# Patient Record
Sex: Female | Born: 1965
Health system: Southern US, Community
[De-identification: ages and names within clinical notes are randomized; demographics above are authoritative.]

## PROBLEM LIST (undated history)

## (undated) DIAGNOSIS — D219 Benign neoplasm of connective and other soft tissue, unspecified: Secondary | ICD-10-CM

## (undated) DIAGNOSIS — G4730 Sleep apnea, unspecified: Secondary | ICD-10-CM

## (undated) DIAGNOSIS — M25562 Pain in left knee: Secondary | ICD-10-CM

## (undated) DIAGNOSIS — F329 Major depressive disorder, single episode, unspecified: Secondary | ICD-10-CM

## (undated) DIAGNOSIS — Z9289 Personal history of other medical treatment: Secondary | ICD-10-CM

## (undated) DIAGNOSIS — I340 Nonrheumatic mitral (valve) insufficiency: Secondary | ICD-10-CM

## (undated) DIAGNOSIS — I77810 Thoracic aortic ectasia: Secondary | ICD-10-CM

## (undated) DIAGNOSIS — D649 Anemia, unspecified: Secondary | ICD-10-CM

## (undated) DIAGNOSIS — R600 Localized edema: Secondary | ICD-10-CM

## (undated) DIAGNOSIS — R51 Headache: Secondary | ICD-10-CM

## (undated) DIAGNOSIS — L309 Dermatitis, unspecified: Secondary | ICD-10-CM

## (undated) DIAGNOSIS — F32A Depression, unspecified: Secondary | ICD-10-CM

## (undated) DIAGNOSIS — M25561 Pain in right knee: Secondary | ICD-10-CM

## (undated) DIAGNOSIS — I10 Essential (primary) hypertension: Secondary | ICD-10-CM

## (undated) DIAGNOSIS — J45909 Unspecified asthma, uncomplicated: Secondary | ICD-10-CM

## (undated) DIAGNOSIS — J302 Other seasonal allergic rhinitis: Secondary | ICD-10-CM

## (undated) HISTORY — PX: BREAST BIOPSY: SHX20

## (undated) HISTORY — DX: Nonrheumatic mitral (valve) insufficiency: I34.0

## (undated) HISTORY — DX: Sleep apnea, unspecified: G47.30

## (undated) HISTORY — DX: Thoracic aortic ectasia: I77.810

## (undated) HISTORY — PX: BREAST SURGERY: SHX581

## (undated) HISTORY — PX: BREAST EXCISIONAL BIOPSY: SUR124

## (undated) HISTORY — DX: Dermatitis, unspecified: L30.9

## (undated) HISTORY — DX: Localized edema: R60.0

## (undated) HISTORY — PX: ABDOMINAL HYSTERECTOMY: SHX81

---

## 1998-04-27 ENCOUNTER — Emergency Department (HOSPITAL_COMMUNITY): Admission: EM | Admit: 1998-04-27 | Discharge: 1998-04-27 | Payer: Self-pay | Admitting: Emergency Medicine

## 2000-08-28 ENCOUNTER — Emergency Department (HOSPITAL_COMMUNITY): Admission: EM | Admit: 2000-08-28 | Discharge: 2000-08-28 | Payer: Self-pay | Admitting: Emergency Medicine

## 2003-08-09 ENCOUNTER — Inpatient Hospital Stay (HOSPITAL_COMMUNITY): Admission: EM | Admit: 2003-08-09 | Discharge: 2003-08-11 | Payer: Self-pay | Admitting: Emergency Medicine

## 2003-08-09 IMAGING — CT CT CHEST W/ CM
2 of 5 series · 12 of 30 positions shown, 15 images · IV contrast (omnipaque)
Comparison: none

CLINICAL DATA: Cough.  Perihilar mass on chest x-ray.
 CT SCAN OF THE CHEST WITH CONTRAST
 The patient received 100 cc of Omnipaque 300 IV.  Comparison with a chest x-ray from earlier today. 
 There is suboptimal opacification of the pulmonary vessels due to timing of the scan. 
 There is a mass in the right inferior hilar region measuring 22 x 28 mm.  The mass appears relatively solid and is of soft tissue density.  The margins are irregular but not definitely spiculated.  In addition, there is some right hilar adenopathy.  The mass could be due to a neoplasm or possibly a round pneumonia and short term followup is suggested in approximately two weeks to evaluate for interval change.  The patient currently has a cough and fever and may have pneumonia based on the clinical evaluation. 
 No other lung masses are seen and there is no effusion.  There is no mediastinal adenopathy.
 IMPRESSION
 22 x 28 mm right lower hilar mass with adjacent hilar adenopathy. This could represent a carcinoma or possibly a rounded pneumonia.  Short-term followup CT is suggested in two weeks to determine for interval change.  If the lesion is stable then biopsy is suggested.

[Series 4: miroi · axial · 0.70mm/px · z∈[-87,-87]mm · 2 of 22 slices shown]
[im 8/22  lung]
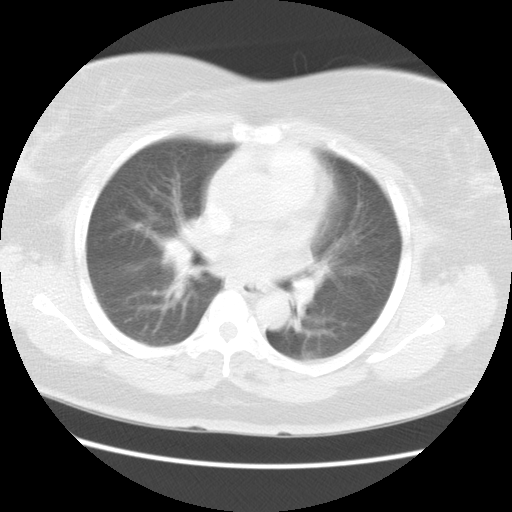
[im 15/22  lung]
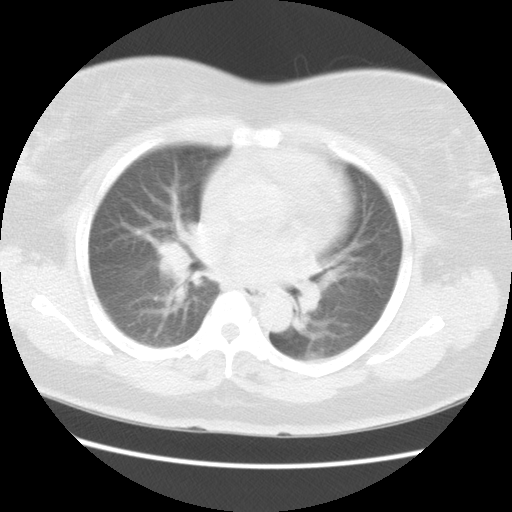

[Series 5: routine chest · axial · 0.70mm/px · z∈[-246,-21]mm · 10 of 57 slices shown, 13 images]
[im 6/57  mediastinal]
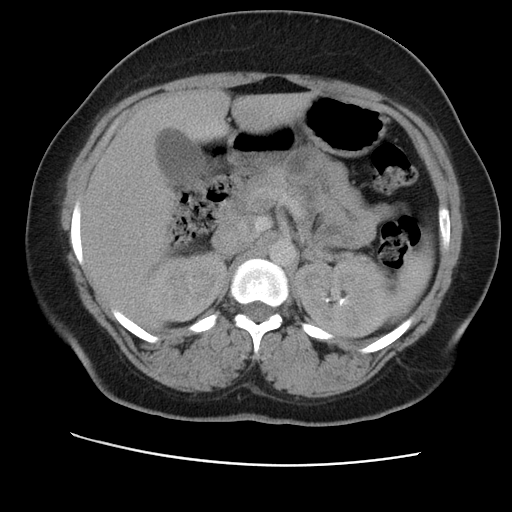
[im 6/57  lung]
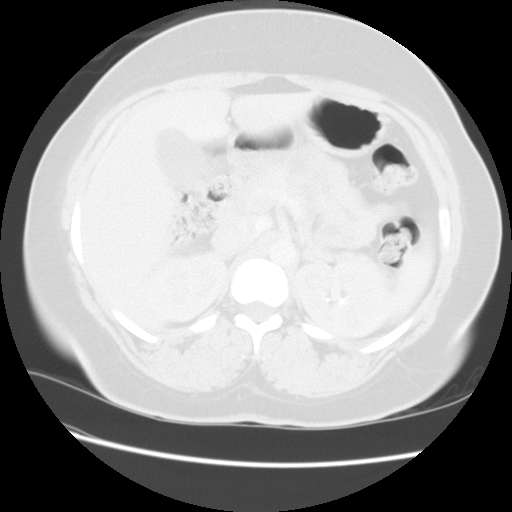
[im 12/57  lung]
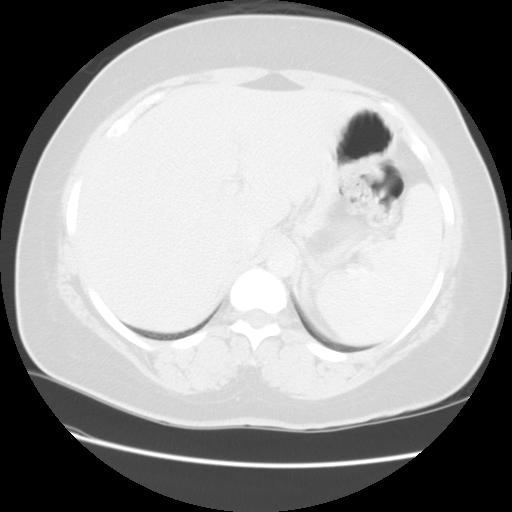
[im 17/57  lung]
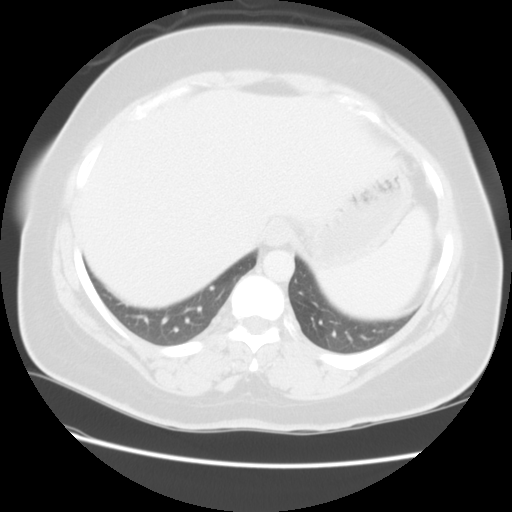
[im 23/57  lung]
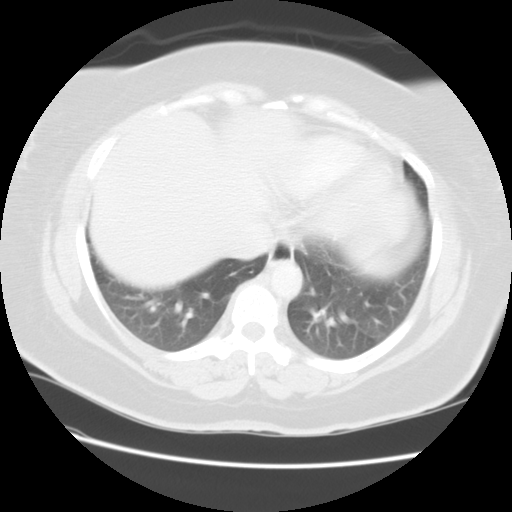
[im 27/57  mediastinal]
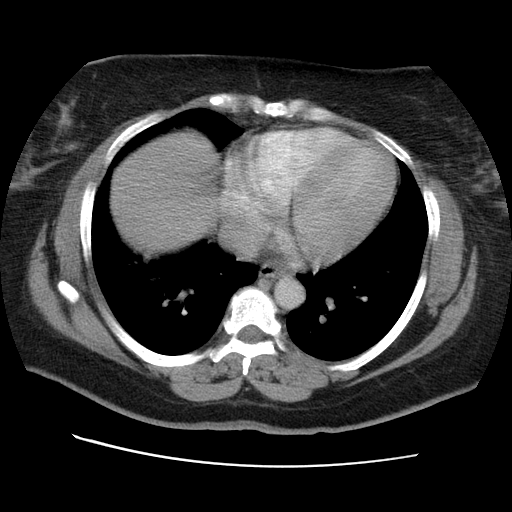
[im 27/57  lung]
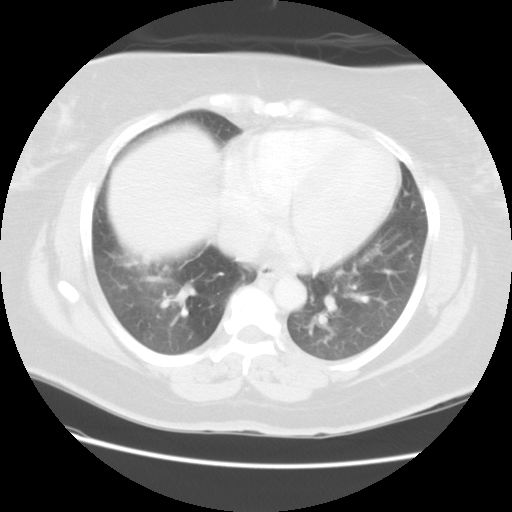
[im 29/57  lung]
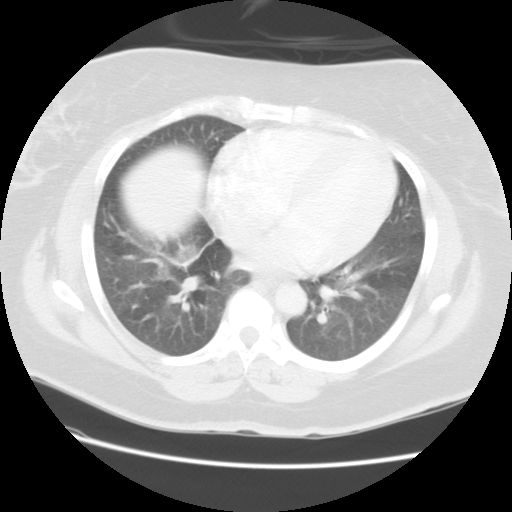
[im 34/57  lung]
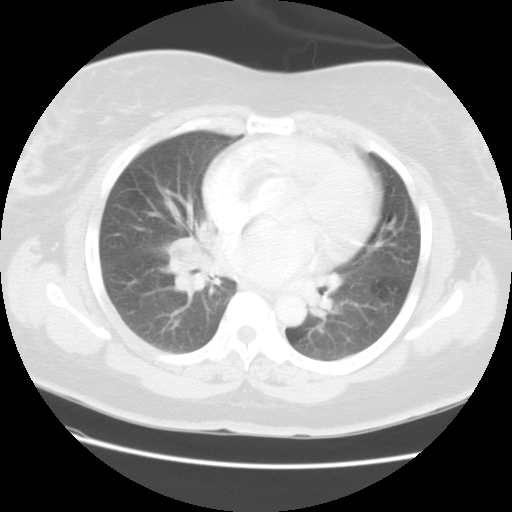
[im 40/57  lung]
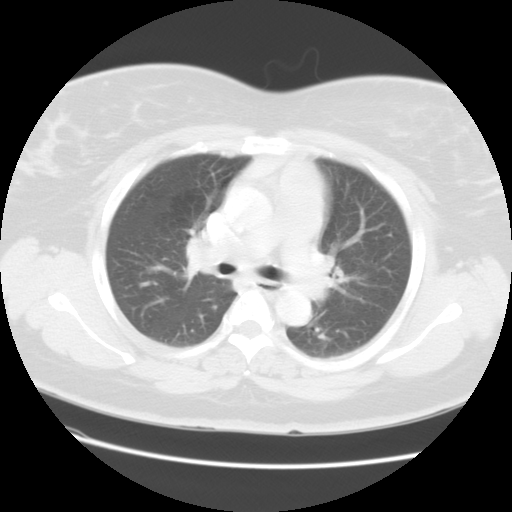
[im 45/57  mediastinal]
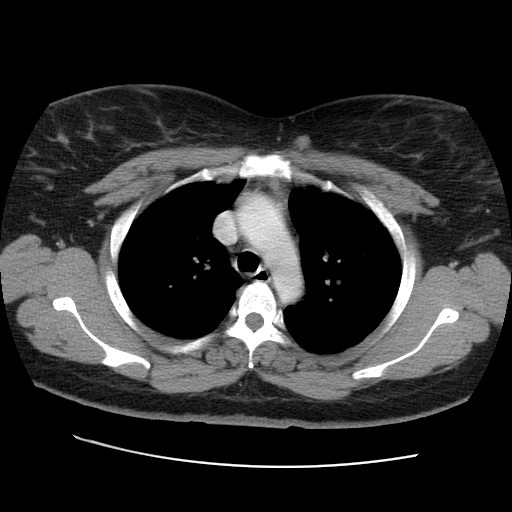
[im 45/57  lung]
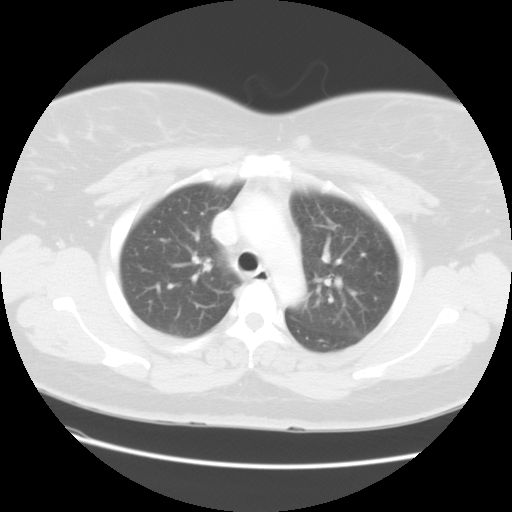
[im 51/57  lung]
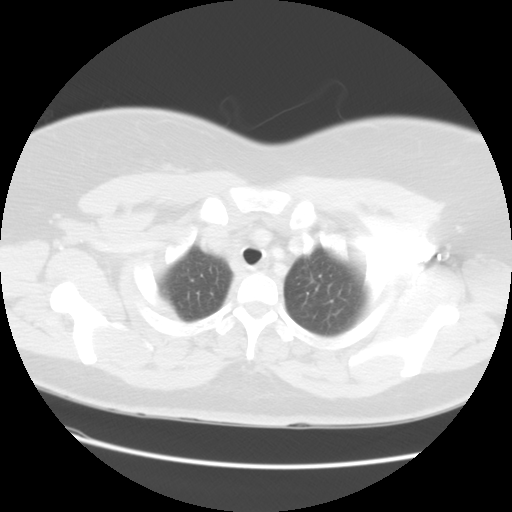

[12 of 30 positions shown; findings below may reference images not displayed]

## 2004-04-28 ENCOUNTER — Emergency Department (HOSPITAL_COMMUNITY): Admission: EM | Admit: 2004-04-28 | Discharge: 2004-04-28 | Payer: Self-pay | Admitting: Emergency Medicine

## 2005-07-09 ENCOUNTER — Emergency Department (HOSPITAL_COMMUNITY): Admission: EM | Admit: 2005-07-09 | Discharge: 2005-07-09 | Payer: Self-pay | Admitting: Emergency Medicine

## 2005-08-03 ENCOUNTER — Ambulatory Visit (HOSPITAL_COMMUNITY): Admission: RE | Admit: 2005-08-03 | Discharge: 2005-08-03 | Payer: Self-pay | Admitting: *Deleted

## 2005-09-18 ENCOUNTER — Encounter (INDEPENDENT_AMBULATORY_CARE_PROVIDER_SITE_OTHER): Payer: Self-pay | Admitting: *Deleted

## 2005-09-18 ENCOUNTER — Inpatient Hospital Stay (HOSPITAL_COMMUNITY): Admission: RE | Admit: 2005-09-18 | Discharge: 2005-09-20 | Payer: Self-pay | Admitting: Obstetrics and Gynecology

## 2005-10-19 ENCOUNTER — Ambulatory Visit: Payer: Self-pay | Admitting: Family Medicine

## 2005-12-25 ENCOUNTER — Ambulatory Visit: Payer: Self-pay | Admitting: Internal Medicine

## 2006-06-07 ENCOUNTER — Ambulatory Visit: Payer: Self-pay | Admitting: Family Medicine

## 2006-09-17 ENCOUNTER — Ambulatory Visit (HOSPITAL_COMMUNITY): Admission: RE | Admit: 2006-09-17 | Discharge: 2006-09-17 | Payer: Self-pay | Admitting: Obstetrics and Gynecology

## 2006-09-17 IMAGING — MG MM DIGITAL SCREENING BILAT
4 series · 4 of 4 positions shown · non-contrast
Comparison: none

DG SCREEN MAMMOGRAM BILATERAL
Bilateral CC and MLO view(s) were taken.
Prior study comparison: [DATE], bilateral screening mammogram.

DIGITAL SCREENING MAMMOGRAM WITH CAD:
There are scattered fibroglandular densities.  There is no dominant mass, architectural distortion 
or calcification to suggest malignancy.

[R CC]
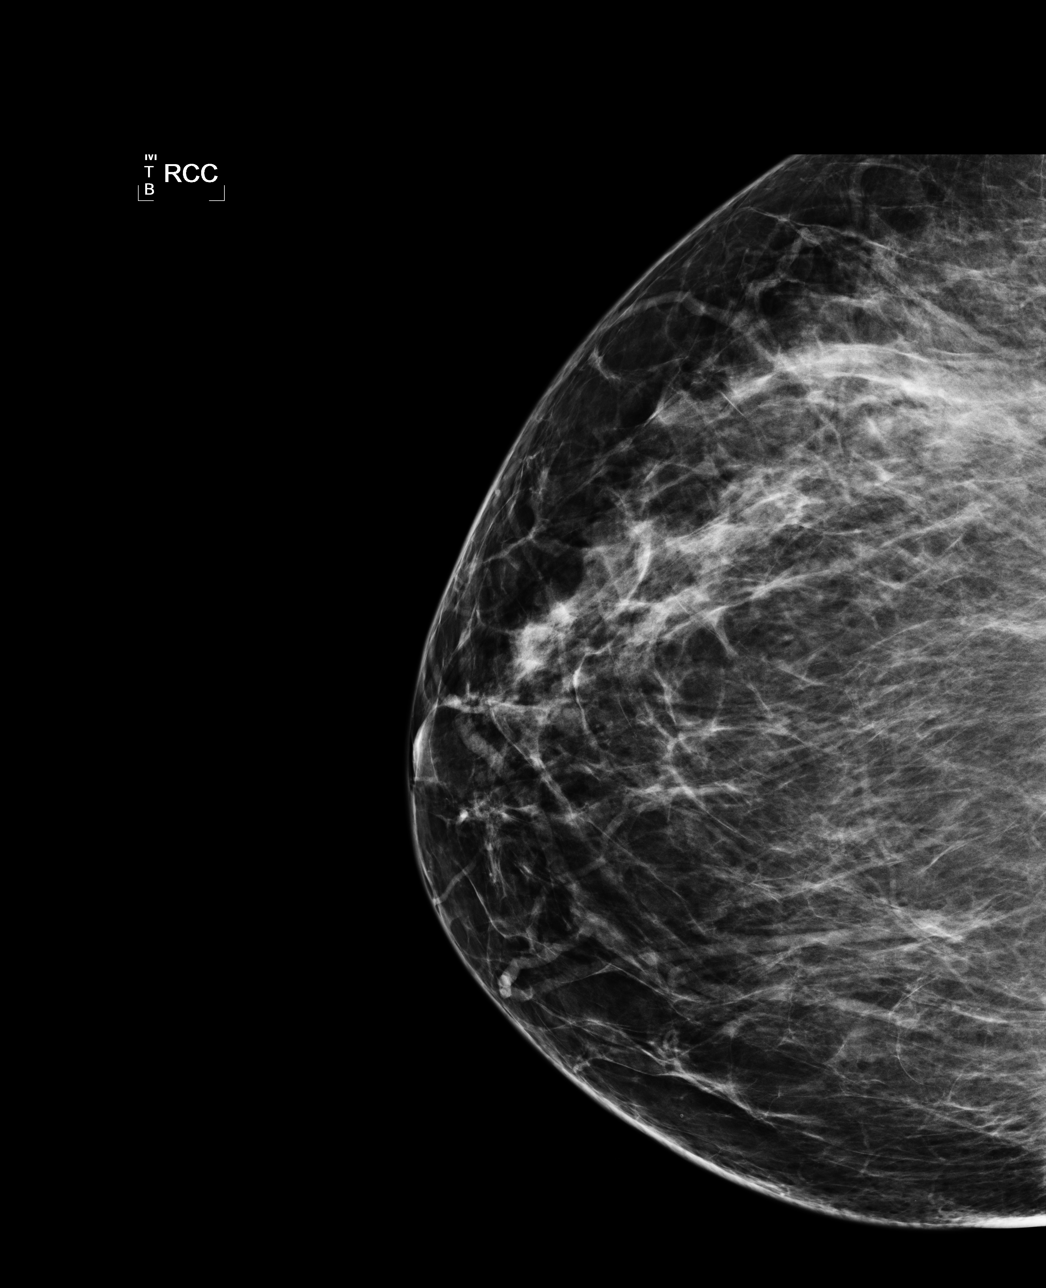

[R MLO]
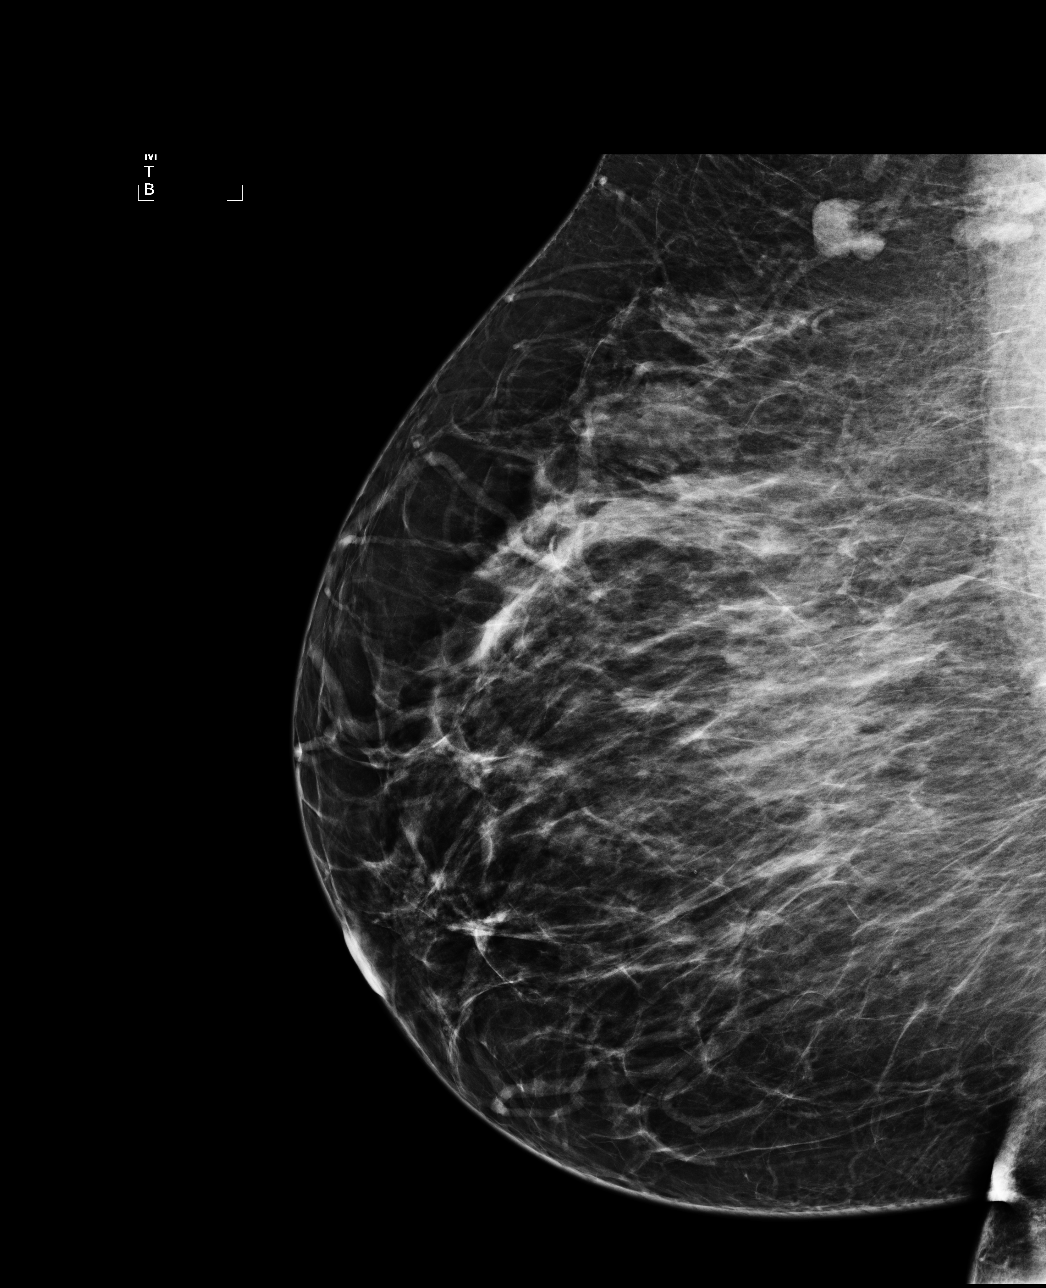

[L CC]
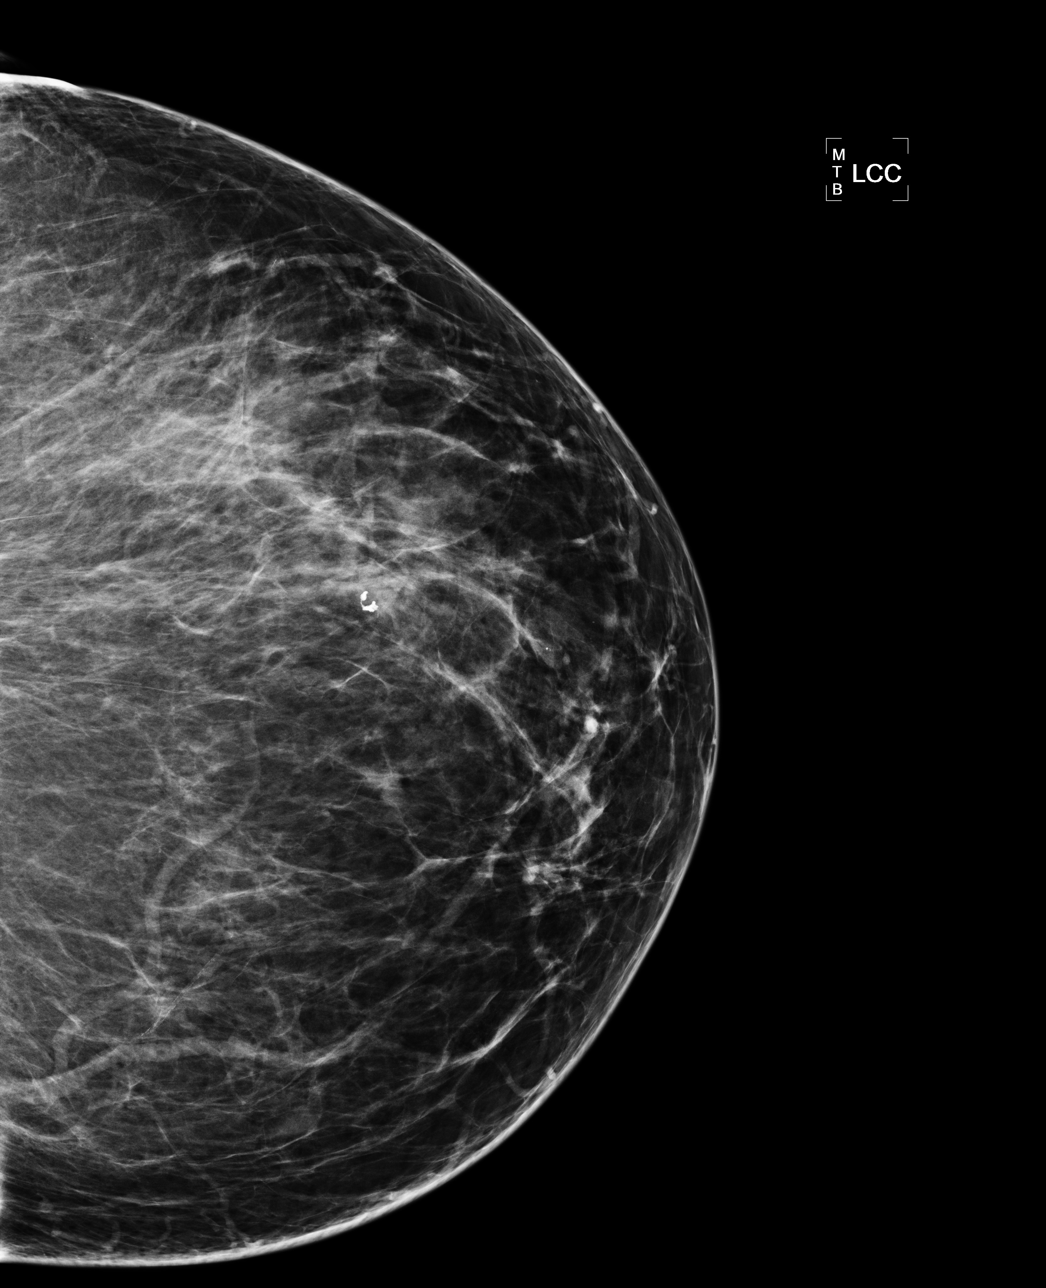

[L MLO]
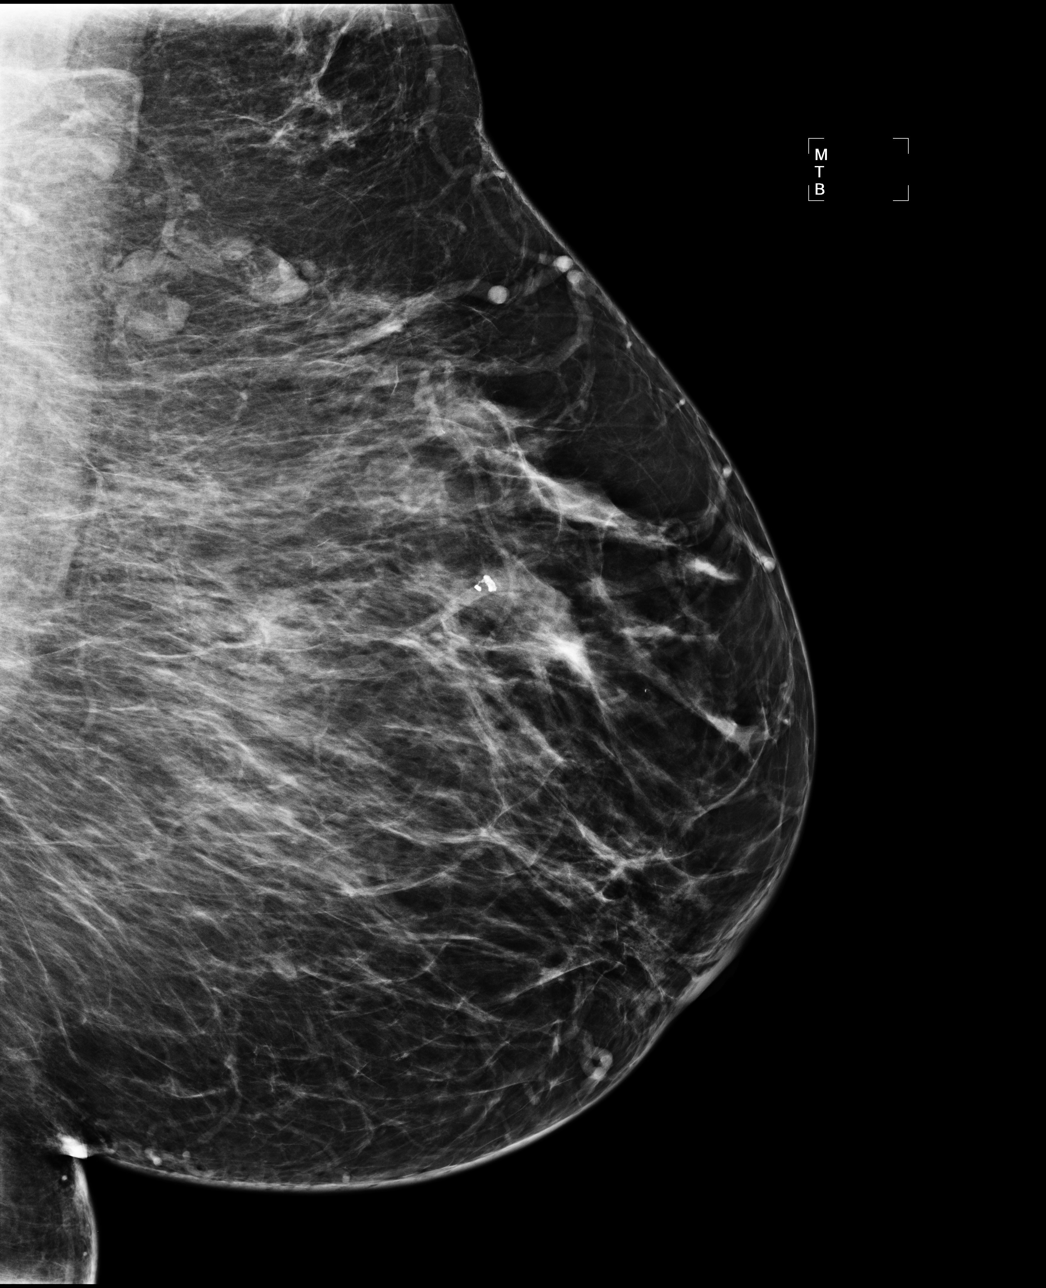

[4 of 4 positions shown; findings below may reference images not displayed]

IMPRESSION: No mammographic evidence of malignancy.  Suggest yearly screening mammography.

ASSESSMENT: Negative - BI-RADS 1

Screening mammogram in 1 year.
ANALYZED BY COMPUTER AIDED DETECTION. , THIS PROCEDURE WAS A DIGITAL MAMMOGRAM.

## 2007-03-20 ENCOUNTER — Ambulatory Visit: Payer: Self-pay | Admitting: Family Medicine

## 2007-03-21 ENCOUNTER — Encounter (INDEPENDENT_AMBULATORY_CARE_PROVIDER_SITE_OTHER): Payer: Self-pay | Admitting: *Deleted

## 2007-05-02 ENCOUNTER — Emergency Department (HOSPITAL_COMMUNITY): Admission: EM | Admit: 2007-05-02 | Discharge: 2007-05-02 | Payer: Self-pay | Admitting: Family Medicine

## 2007-09-25 ENCOUNTER — Ambulatory Visit (HOSPITAL_COMMUNITY): Admission: RE | Admit: 2007-09-25 | Discharge: 2007-09-25 | Payer: Self-pay | Admitting: Obstetrics and Gynecology

## 2007-09-25 ENCOUNTER — Encounter (INDEPENDENT_AMBULATORY_CARE_PROVIDER_SITE_OTHER): Payer: Self-pay | Admitting: Nurse Practitioner

## 2007-09-25 IMAGING — MG MM DIGITAL SCREENING BILAT W/ CAD
6 series · 6 of 6 positions shown · non-contrast
Comparison: none

DG SCREEN MAMMOGRAM BILATERAL
Bilateral CC and MLO view(s) were taken.

DIGITAL SCREENING MAMMOGRAM WITH CAD:
There are scattered fibroglandular densities.  No masses or malignant type calcifications are 
identified.  Compared with prior studies.

[R CC]
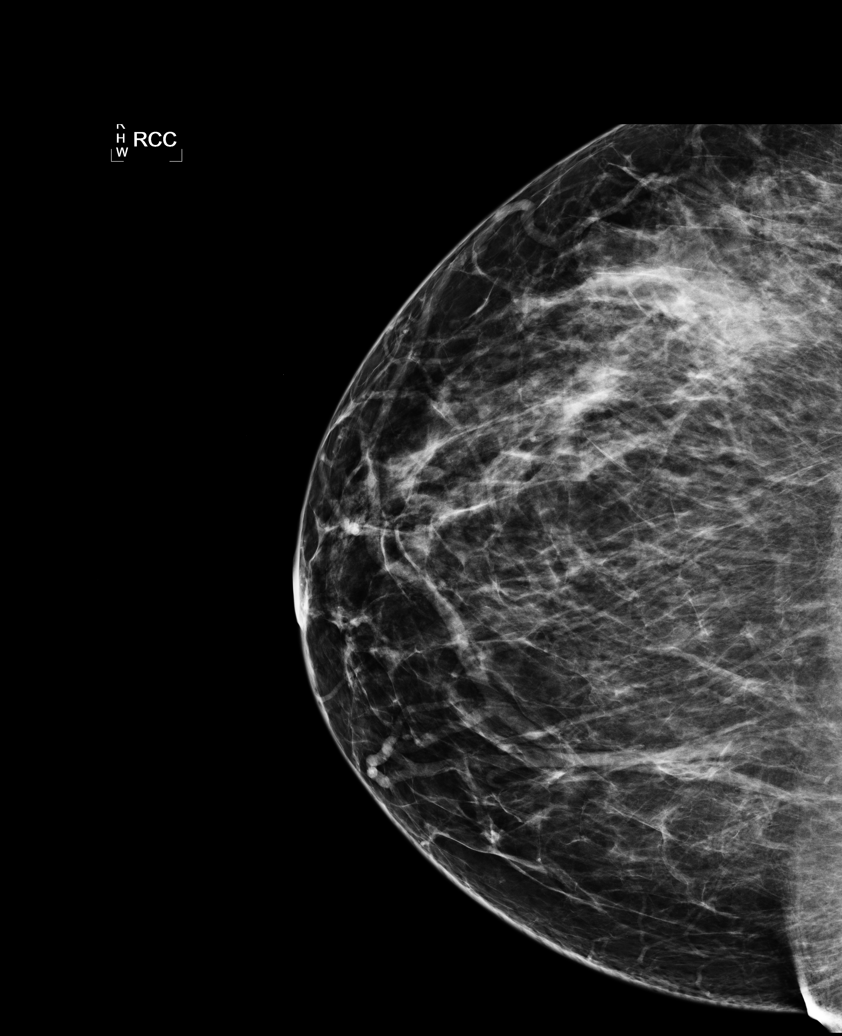

[R MLO (1 of 2)]
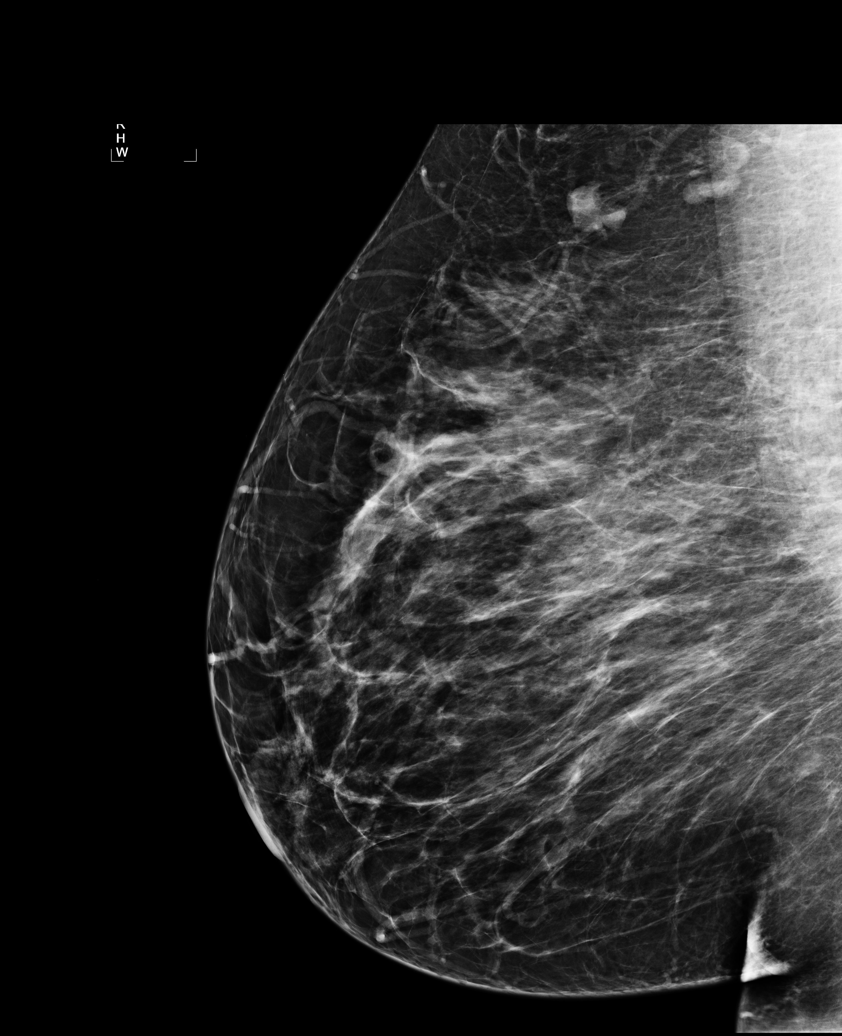

[L CC]
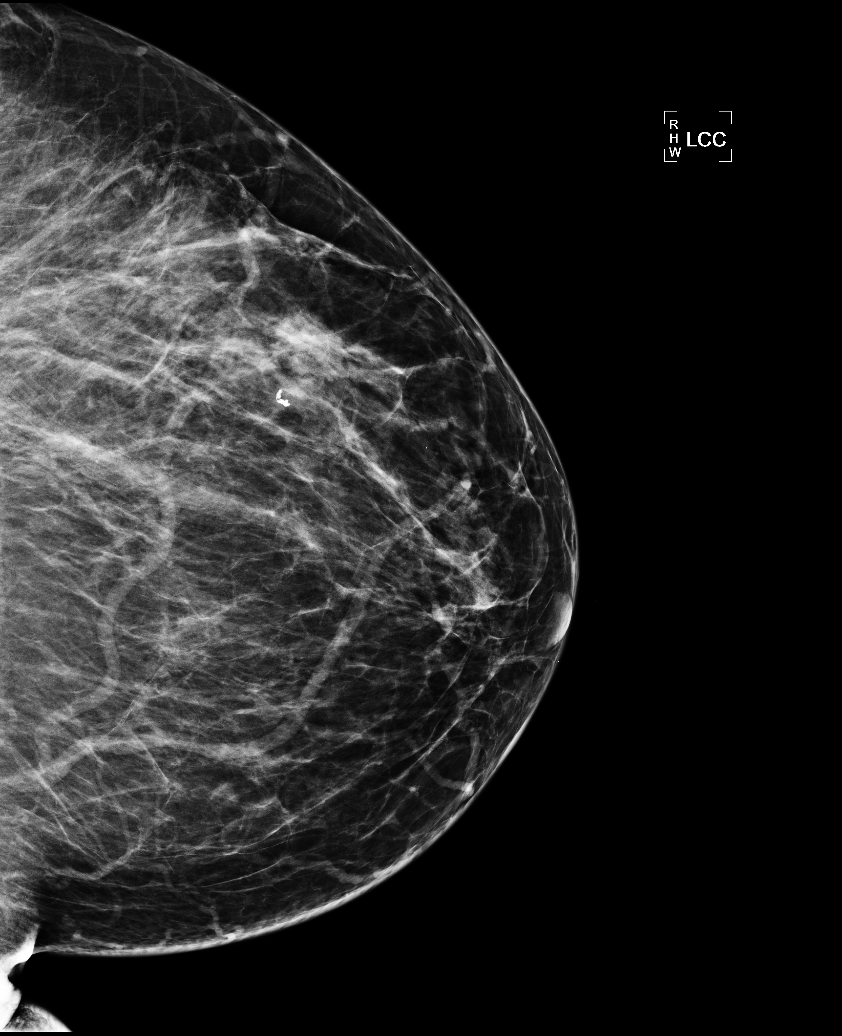

[L MLO (1 of 2)]
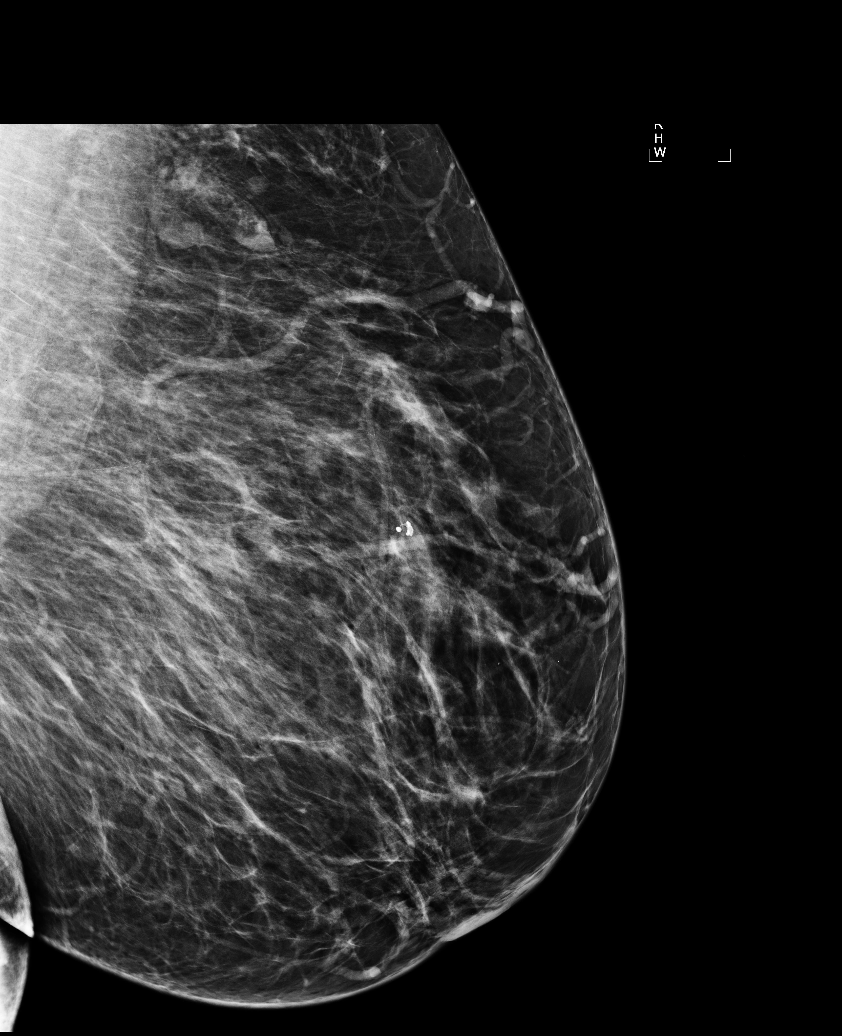

[R MLO (2 of 2)]
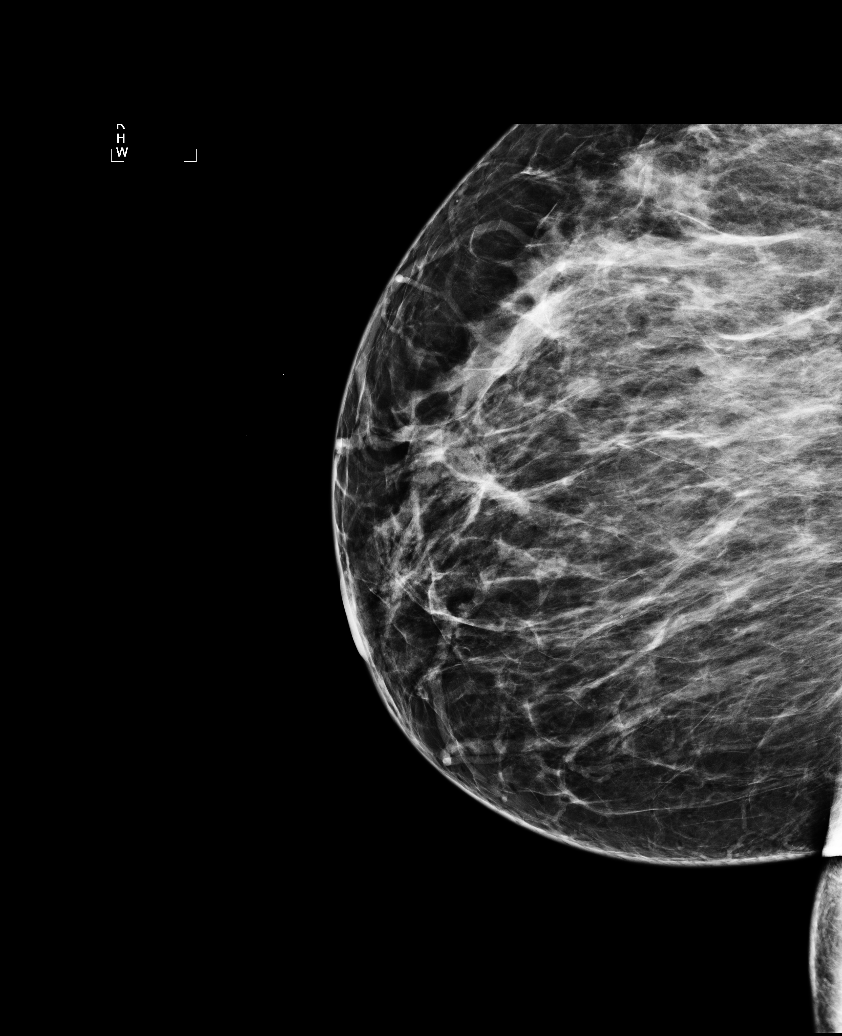

[L MLO (2 of 2)]
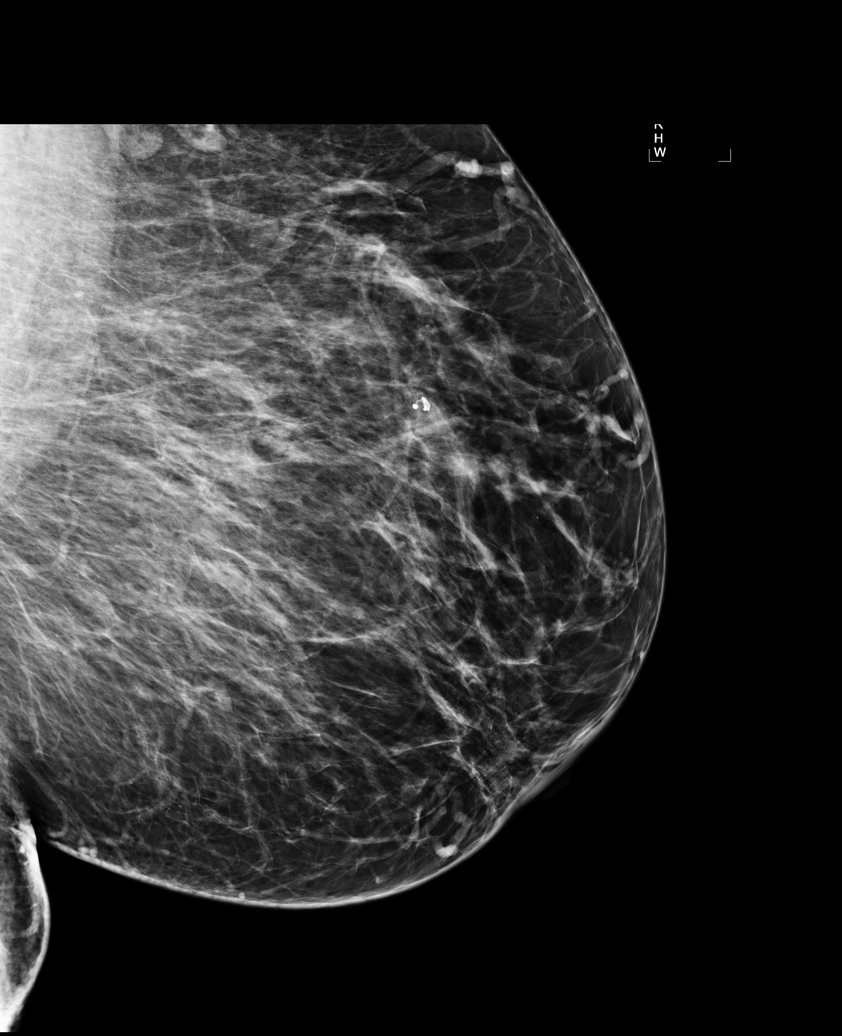

[6 of 6 positions shown; findings below may reference images not displayed]

IMPRESSION: No specific mammographic evidence of malignancy.  Next screening mammogram is recommended in one 
year.

ASSESSMENT: Negative - BI-RADS 1

Screening mammogram in 1 year.
ANALYZED BY COMPUTER AIDED DETECTION. , THIS PROCEDURE WAS A DIGITAL MAMMOGRAM.

## 2008-02-16 ENCOUNTER — Telehealth (INDEPENDENT_AMBULATORY_CARE_PROVIDER_SITE_OTHER): Payer: Self-pay | Admitting: Nurse Practitioner

## 2008-04-14 ENCOUNTER — Ambulatory Visit: Payer: Self-pay | Admitting: Nurse Practitioner

## 2008-04-14 DIAGNOSIS — J309 Allergic rhinitis, unspecified: Secondary | ICD-10-CM | POA: Insufficient documentation

## 2008-04-14 DIAGNOSIS — I10 Essential (primary) hypertension: Secondary | ICD-10-CM | POA: Insufficient documentation

## 2008-04-14 DIAGNOSIS — J45909 Unspecified asthma, uncomplicated: Secondary | ICD-10-CM | POA: Insufficient documentation

## 2008-04-14 DIAGNOSIS — R599 Enlarged lymph nodes, unspecified: Secondary | ICD-10-CM | POA: Insufficient documentation

## 2008-04-20 ENCOUNTER — Encounter: Admission: RE | Admit: 2008-04-20 | Discharge: 2008-04-20 | Payer: Self-pay | Admitting: Family Medicine

## 2008-04-20 ENCOUNTER — Encounter (INDEPENDENT_AMBULATORY_CARE_PROVIDER_SITE_OTHER): Payer: Self-pay | Admitting: Diagnostic Radiology

## 2008-04-20 ENCOUNTER — Encounter (INDEPENDENT_AMBULATORY_CARE_PROVIDER_SITE_OTHER): Payer: Self-pay | Admitting: Nurse Practitioner

## 2008-04-20 DIAGNOSIS — D249 Benign neoplasm of unspecified breast: Secondary | ICD-10-CM | POA: Insufficient documentation

## 2008-04-20 IMAGING — MG MM DIGITAL DIAGNOSTIC UNILAT*L*
8 series · 8 of 8 positions shown · non-contrast
Comparison: [DATE], [DATE], [DATE]

CLINICAL DATA: The patient has a palpable left axillary mass.  She
first noted this approximately 2-3 weeks ago.  No improvement
following menses.

DIGITAL DIAGNOSTIC  LEFT  MAMMOGRAM  WITH CAD AND LEFT BREAST
ULTRASOUND:

[L CC (1 of 4)]
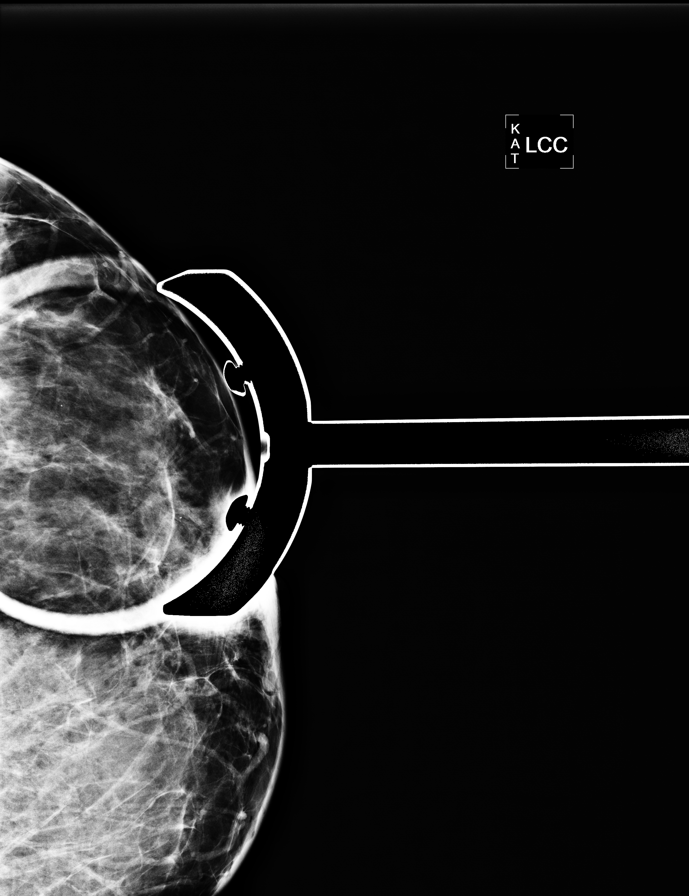

[L CC (2 of 4)]
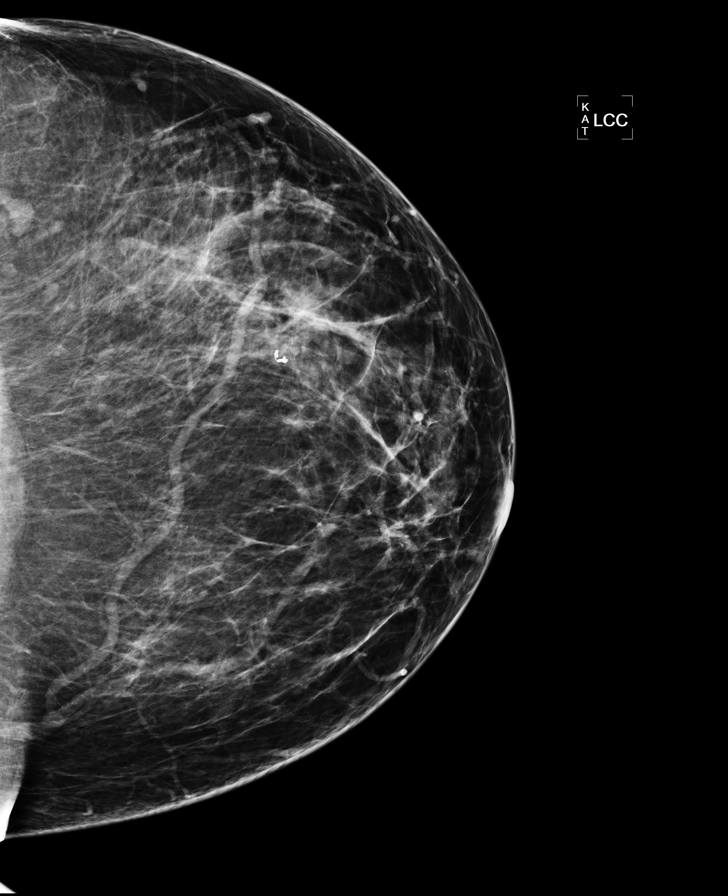

[L MLO (1 of 2)]
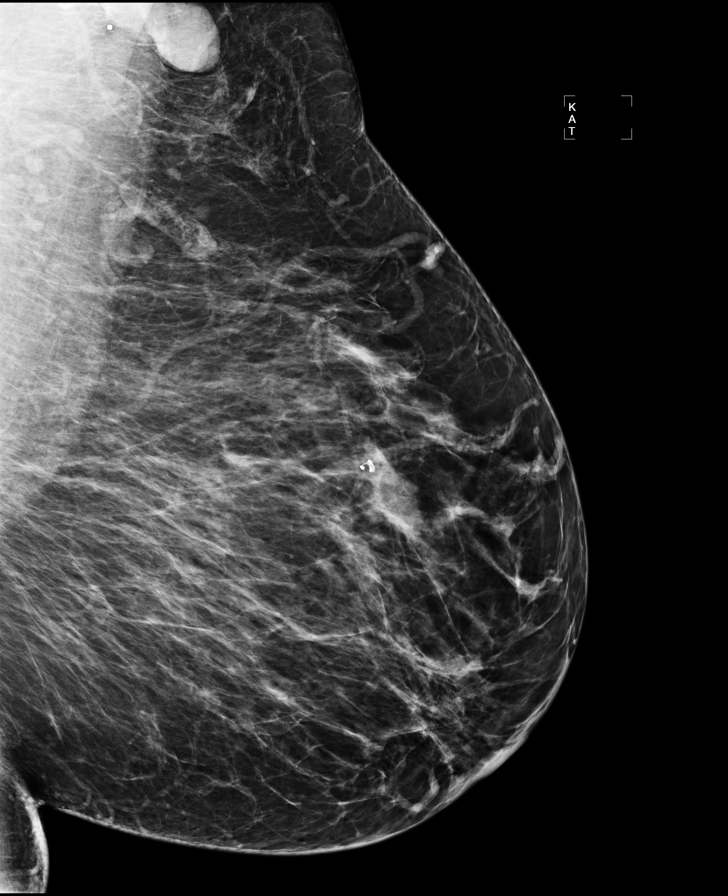

[L MLO (2 of 2)]
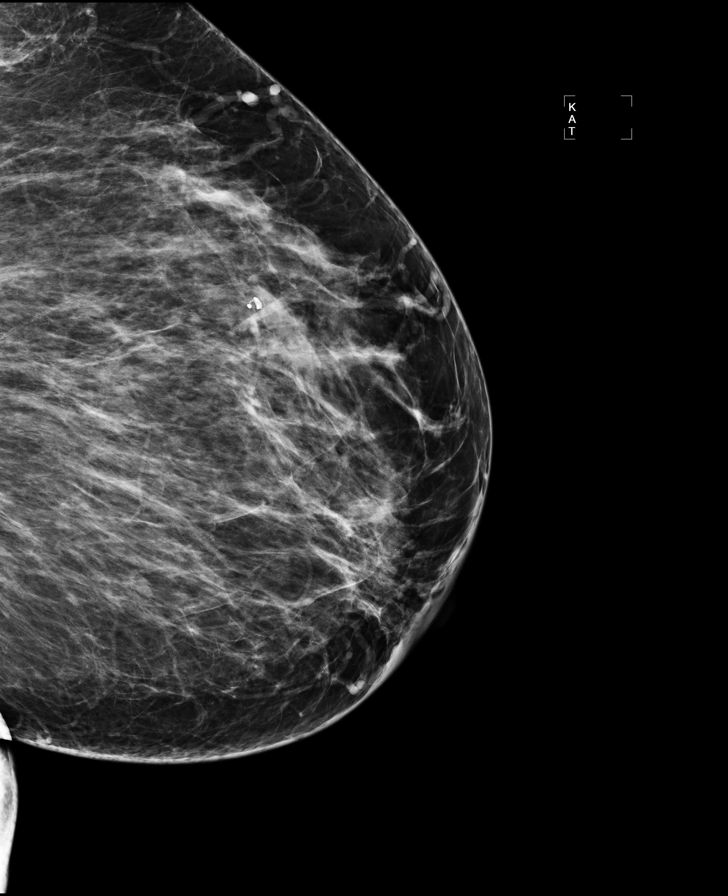

[L CC (3 of 4)]
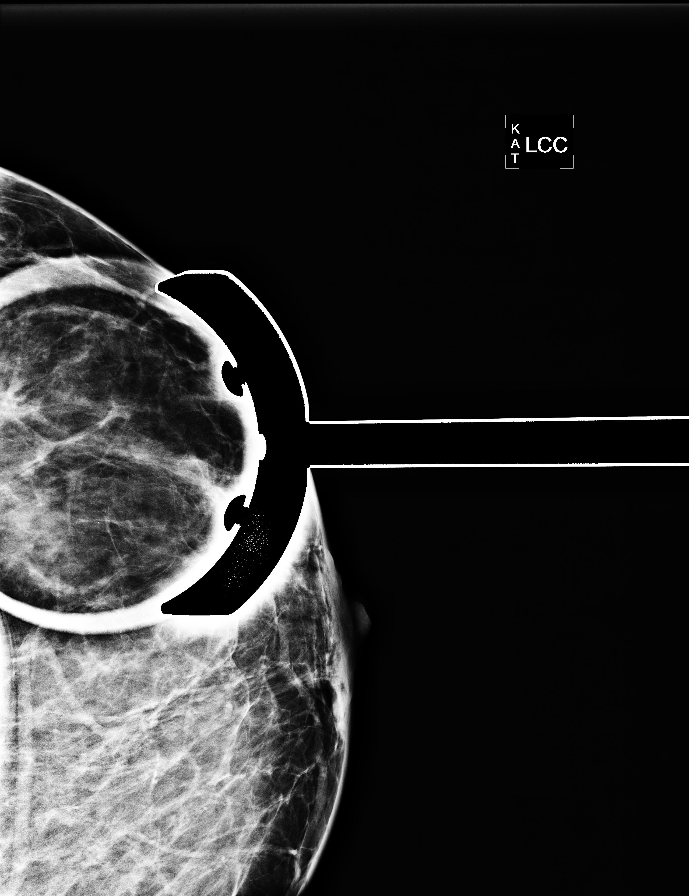

[L TAN]
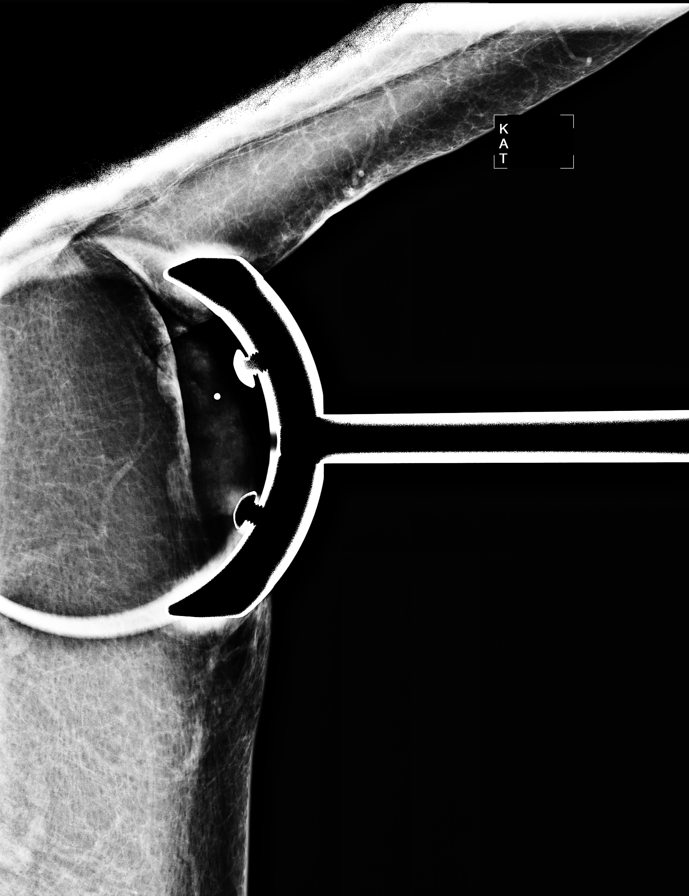

[L CC (4 of 4)]
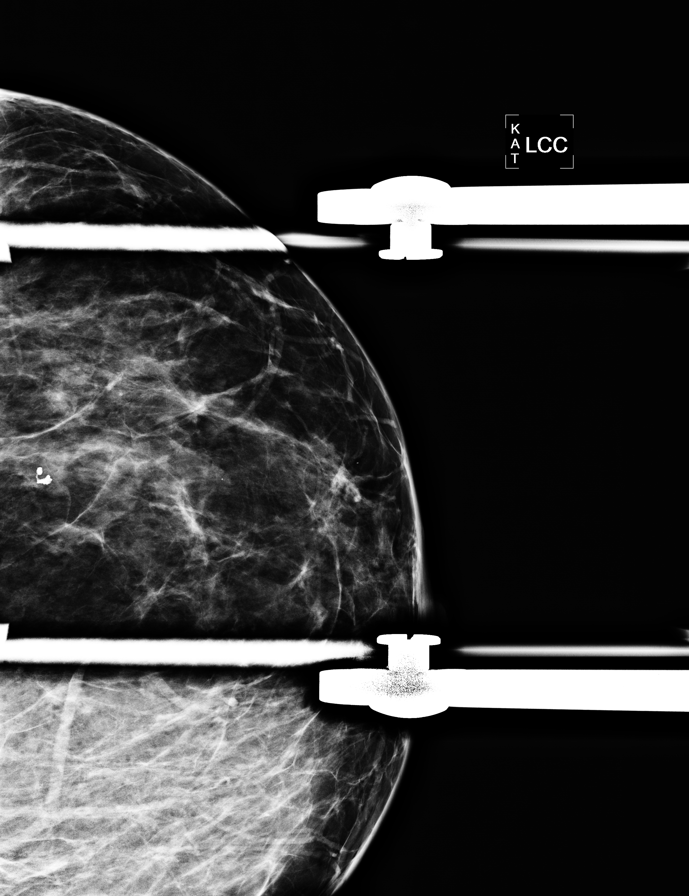

[L ML]
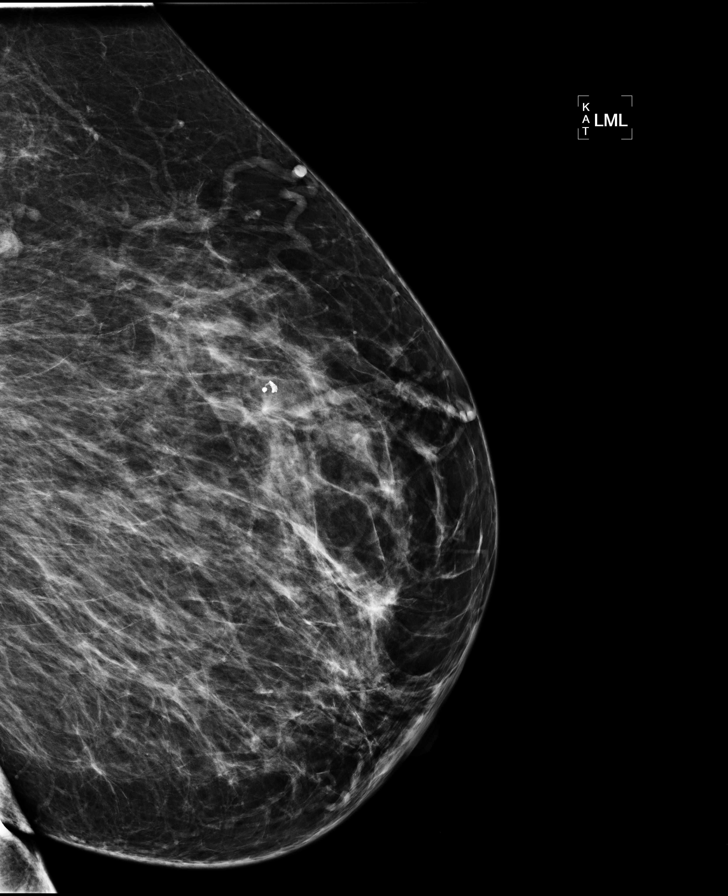

[8 of 8 positions shown; findings below may reference images not displayed]

FINDINGS: There are scattered fibroglandular densities on the left.
Enlarged left axillary lymph node is identified, corresponding to
the area of patient's concern.  This is confirmed on spot
tangential view.  Within the breast, no suspicious abnormalities
are identified.  Spot compression views are performed in the upper-
outer quadrant, showing no persistent abnormality.

On physical exam, I do palpate a superficial mobile slightly tender
mass in the left axilla, in the area of patient's concern.

Ultrasound is performed, showing a rounded bilobed mass within the
left axilla, measuring 4.3 cm in maximum diameter.  Other left
axillary lymph nodes have a normal fatty hilum.

The patient has two benign biopsy scars in the upper portion of the
left breast.  Evaluation of the entire left breast by ultrasound
shows no definite mass.  Within the 1 o'clock position of the left
breast, there is a vague shadowing but this is in an area of
previous biopsy.
IMPRESSION: Left axillary lymph node warrants biopsy to exclude malignancy.  I
discussed the options of biopsy with the patient.  Ultrasound-
guided core biopsy is performed on the same day and dictated
separately.  There is no mammographic or ultrasound evidence for
malignancy within the breast itself.

BI-RADS CATEGORY 4:  Suspicious abnormality - biopsy should be
considered.

## 2008-04-20 IMAGING — US UNKNOWN PR STUDY
1 series · 16 of 16 positions shown · non-contrast
Comparison: [DATE], [DATE], [DATE]

CLINICAL DATA: The patient has a palpable left axillary mass.  She
first noted this approximately 2-3 weeks ago.  No improvement
following menses.

DIGITAL DIAGNOSTIC  LEFT  MAMMOGRAM  WITH CAD AND LEFT BREAST
ULTRASOUND:

[Series 1: unknown pr study · 16 of 18 slices shown]
[im 1/18]
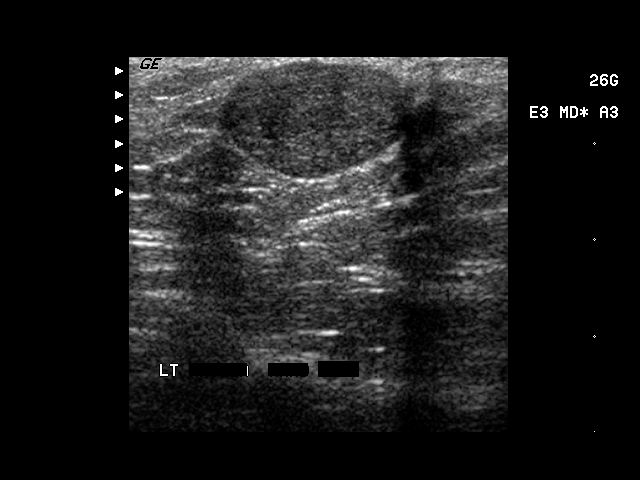
[im 2/18]
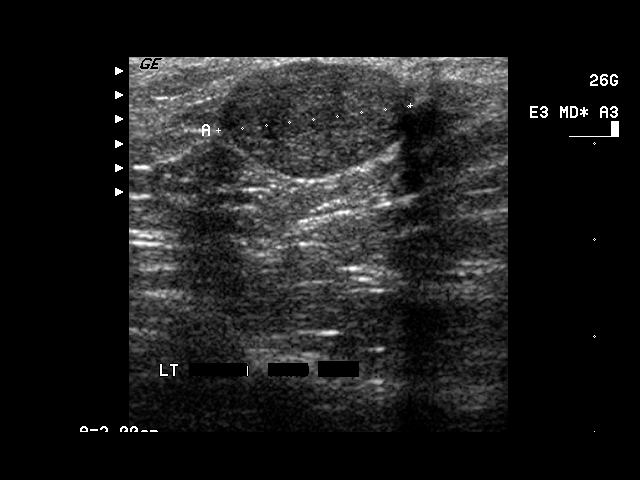
[im 3/18]
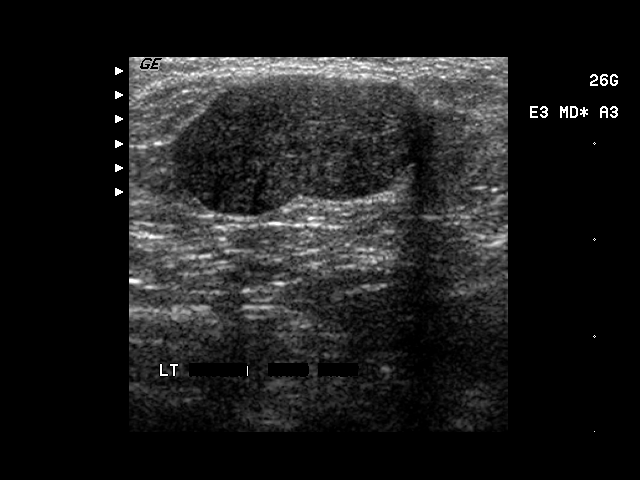
[im 4/18]
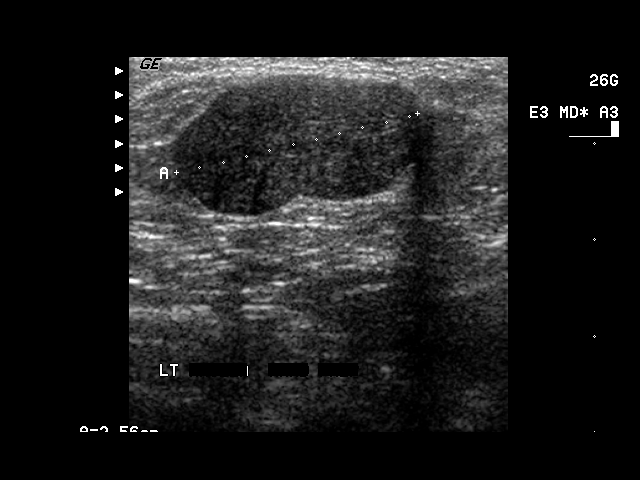
[im 5/18]
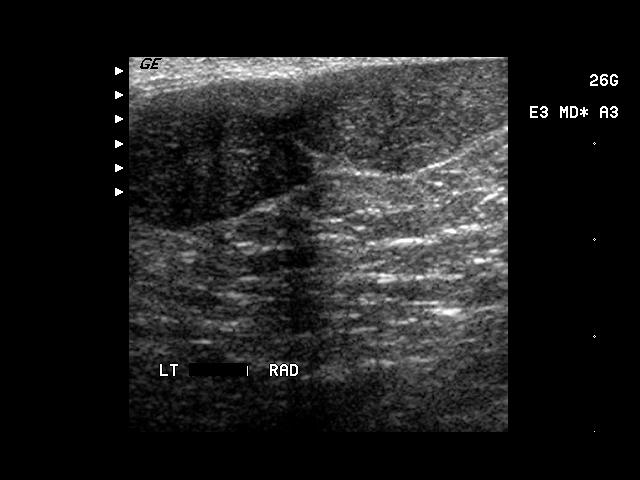
[im 6/18]
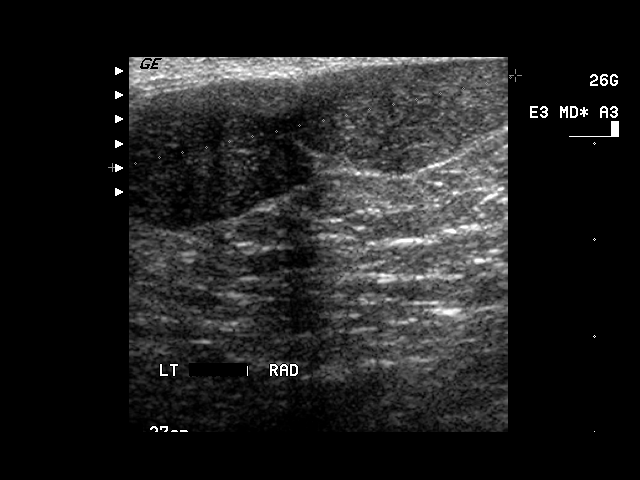
[im 7/18]
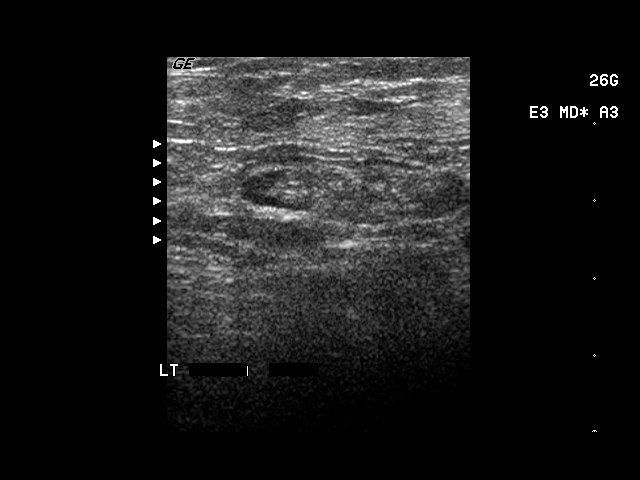
[im 8/18]
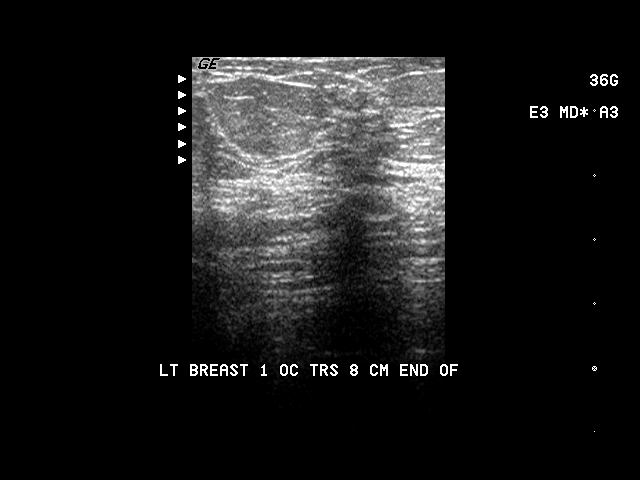
[im 10/18]
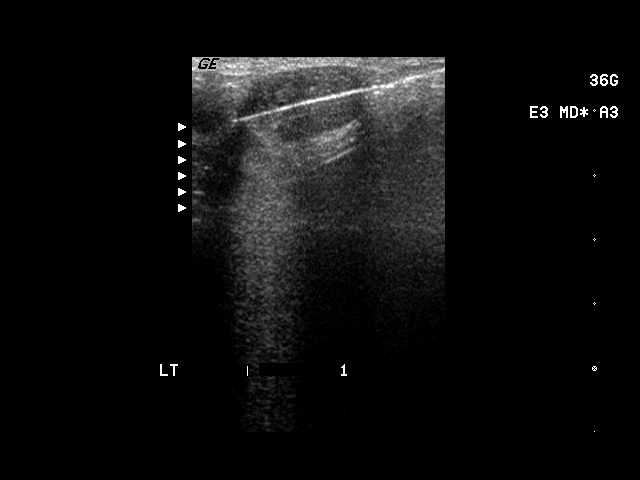
[im 11/18]
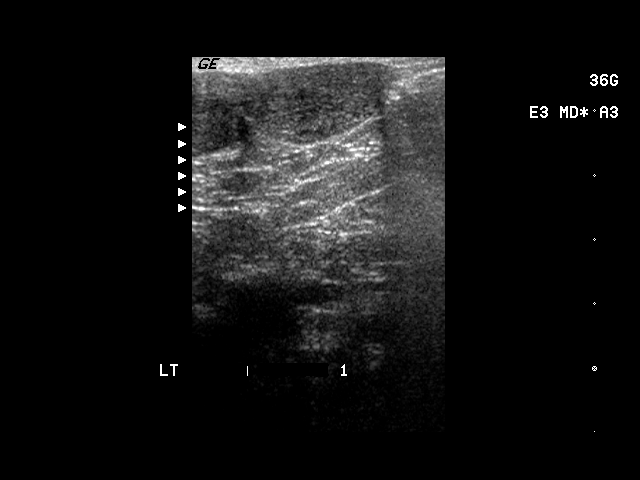
[im 12/18]
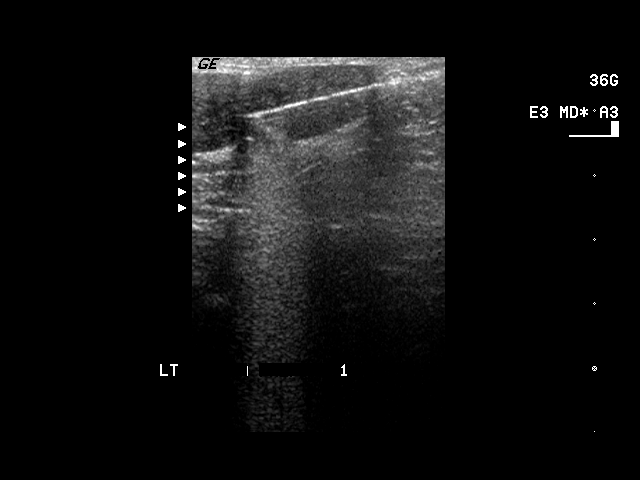
[im 13/18]
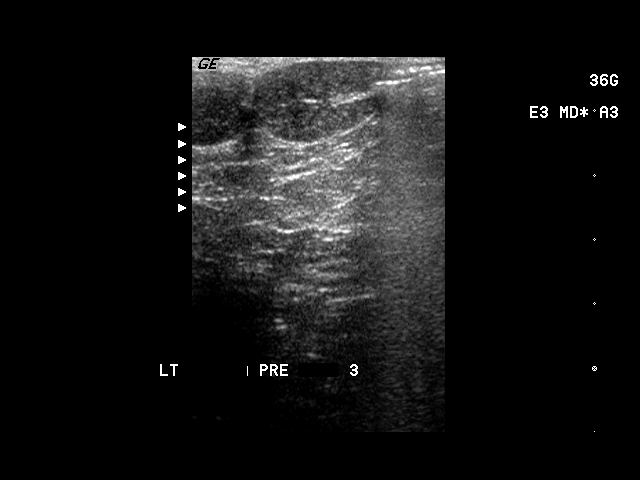
[im 14/18]
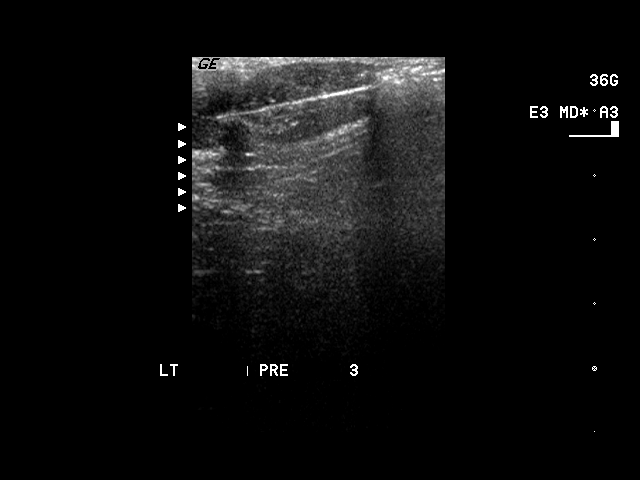
[im 15/18]
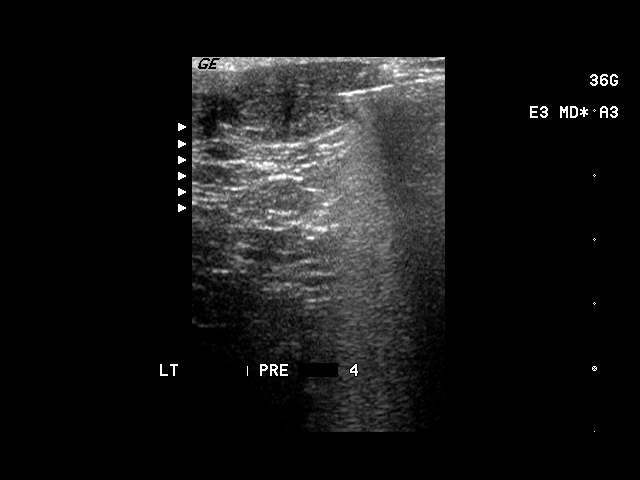
[im 16/18]
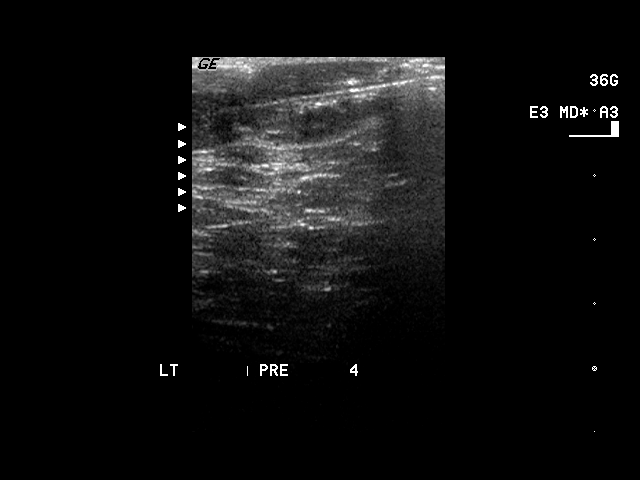
[im 18/18]
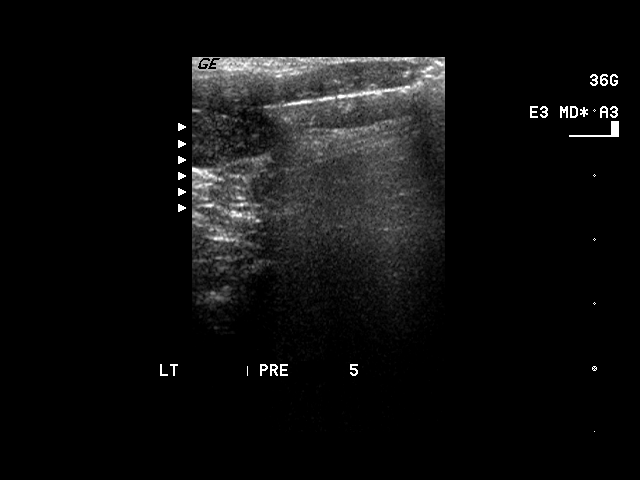

[16 of 16 positions shown; findings below may reference images not displayed]

FINDINGS: There are scattered fibroglandular densities on the left.
Enlarged left axillary lymph node is identified, corresponding to
the area of patient's concern.  This is confirmed on spot
tangential view.  Within the breast, no suspicious abnormalities
are identified.  Spot compression views are performed in the upper-
outer quadrant, showing no persistent abnormality.

On physical exam, I do palpate a superficial mobile slightly tender
mass in the left axilla, in the area of patient's concern.

Ultrasound is performed, showing a rounded bilobed mass within the
left axilla, measuring 4.3 cm in maximum diameter.  Other left
axillary lymph nodes have a normal fatty hilum.

The patient has two benign biopsy scars in the upper portion of the
left breast.  Evaluation of the entire left breast by ultrasound
shows no definite mass.  Within the 1 o'clock position of the left
breast, there is a vague shadowing but this is in an area of
previous biopsy.
IMPRESSION: Left axillary lymph node warrants biopsy to exclude malignancy.  I
discussed the options of biopsy with the patient.  Ultrasound-
guided core biopsy is performed on the same day and dictated
separately.  There is no mammographic or ultrasound evidence for
malignancy within the breast itself.

BI-RADS CATEGORY 4:  Suspicious abnormality - biopsy should be
considered.

## 2008-04-21 LAB — CONVERTED CEMR LAB
ALT: 14 units/L (ref 0–35)
AST: 16 units/L (ref 0–37)
Albumin: 4 g/dL (ref 3.5–5.2)
Alkaline Phosphatase: 70 units/L (ref 39–117)
BUN: 15 mg/dL (ref 6–23)
Basophils Absolute: 0 10*3/uL (ref 0.0–0.1)
Basophils Relative: 0 % (ref 0–1)
CO2: 22 meq/L (ref 19–32)
Calcium: 9.4 mg/dL (ref 8.4–10.5)
Chloride: 104 meq/L (ref 96–112)
Cholesterol: 171 mg/dL (ref 0–200)
Creatinine, Ser: 0.63 mg/dL (ref 0.40–1.20)
Eosinophils Absolute: 0.1 10*3/uL (ref 0.0–0.7)
Eosinophils Relative: 2 % (ref 0–5)
Glucose, Bld: 89 mg/dL (ref 70–99)
HCT: 30.2 % — ABNORMAL LOW (ref 36.0–46.0)
HDL: 80 mg/dL (ref >39)
Hemoglobin: 9.2 g/dL — ABNORMAL LOW (ref 12.0–15.0)
LDL Cholesterol: 80 mg/dL (ref 0–99)
Lymphocytes Relative: 34 % (ref 12–46)
Lymphs Abs: 2 10*3/uL (ref 0.7–4.0)
MCHC: 30.5 g/dL (ref 30.0–36.0)
MCV: 83 fL (ref 78.0–100.0)
Monocytes Absolute: 0.5 10*3/uL (ref 0.1–1.0)
Monocytes Relative: 8 % (ref 3–12)
Neutro Abs: 3.3 10*3/uL (ref 1.7–7.7)
Neutrophils Relative %: 56 % (ref 43–77)
Platelets: 199 10*3/uL (ref 150–400)
Potassium: 4 meq/L (ref 3.5–5.3)
RBC: 3.64 M/uL — ABNORMAL LOW (ref 3.87–5.11)
RDW: 19.7 % — ABNORMAL HIGH (ref 11.5–15.5)
Sodium: 138 meq/L (ref 135–145)
TSH: 1.083 microintl units/mL (ref 0.350–4.50)
Total Bilirubin: 0.3 mg/dL (ref 0.3–1.2)
Total CHOL/HDL Ratio: 2.1
Total Protein: 7.4 g/dL (ref 6.0–8.3)
Triglycerides: 54 mg/dL (ref <150)
VLDL: 11 mg/dL (ref 0–40)
WBC: 5.9 10*3/uL (ref 4.0–10.5)

## 2008-06-17 ENCOUNTER — Encounter (INDEPENDENT_AMBULATORY_CARE_PROVIDER_SITE_OTHER): Payer: Self-pay | Admitting: Nurse Practitioner

## 2008-09-03 ENCOUNTER — Encounter (INDEPENDENT_AMBULATORY_CARE_PROVIDER_SITE_OTHER): Payer: Self-pay | Admitting: Nurse Practitioner

## 2008-10-14 ENCOUNTER — Encounter (INDEPENDENT_AMBULATORY_CARE_PROVIDER_SITE_OTHER): Payer: Self-pay | Admitting: Nurse Practitioner

## 2008-10-19 ENCOUNTER — Encounter: Admission: RE | Admit: 2008-10-19 | Discharge: 2008-10-19 | Payer: Self-pay | Admitting: Internal Medicine

## 2008-10-19 IMAGING — US UNKNOWN PR STUDY
1 series · 8 of 8 positions shown · non-contrast
Comparison: Prior studies

CLINICAL DATA: 6-month reevaluation of left breast nodule and
annual reevaluation of the right breast. Previous ultrasound guided
core biopsy of the left axillary mass performed on [DATE]
demonstrated a fibroadenoma.

DIGITAL DIAGNOSTIC  BILATERAL  MAMMOGRAM  WITH CAD AND LEFT BREAST
ULTRASOUND:

[Series 1: unknown pr study · 8 of 8 slices shown]
[im 1/8]
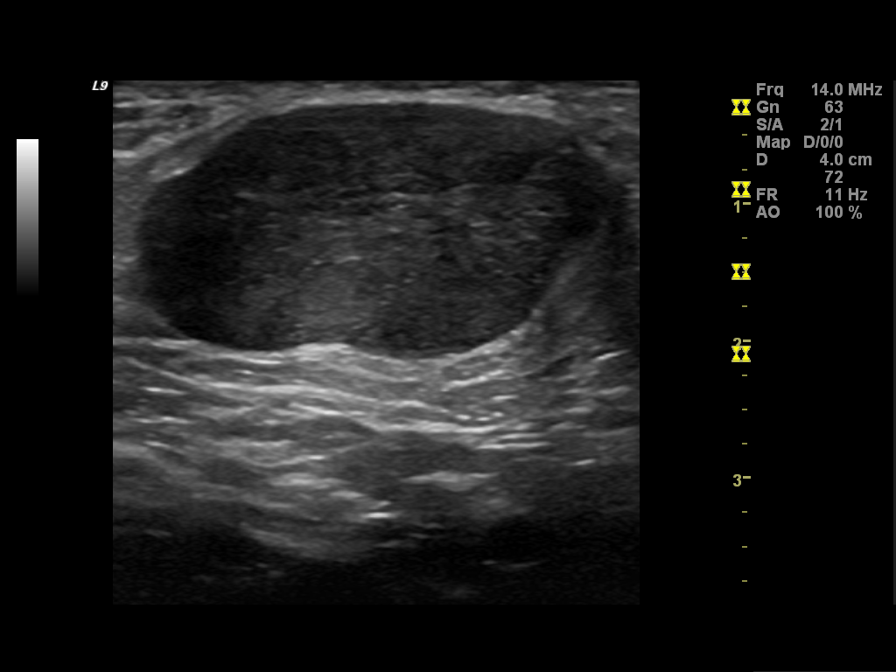
[im 2/8]
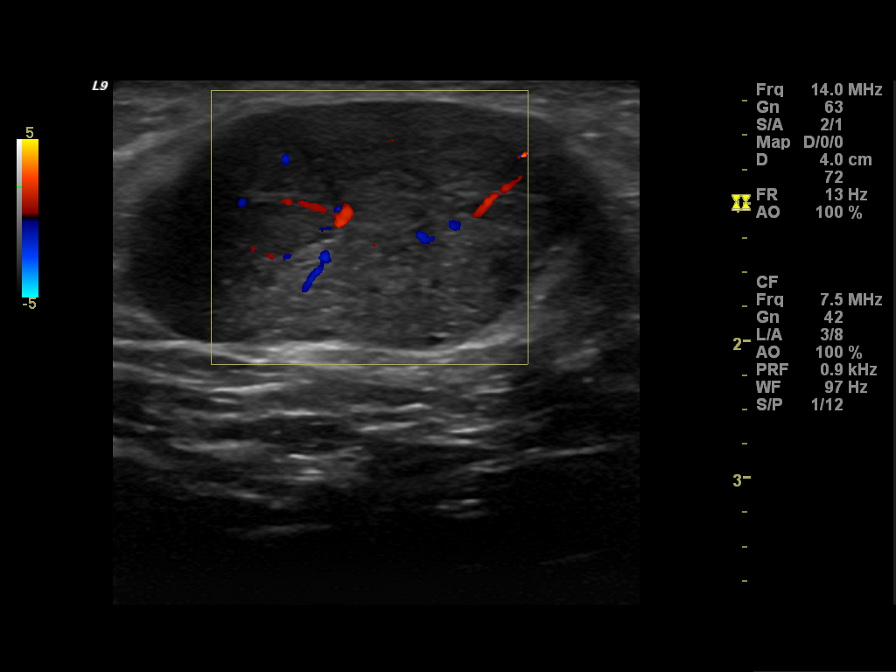
[im 3/8]
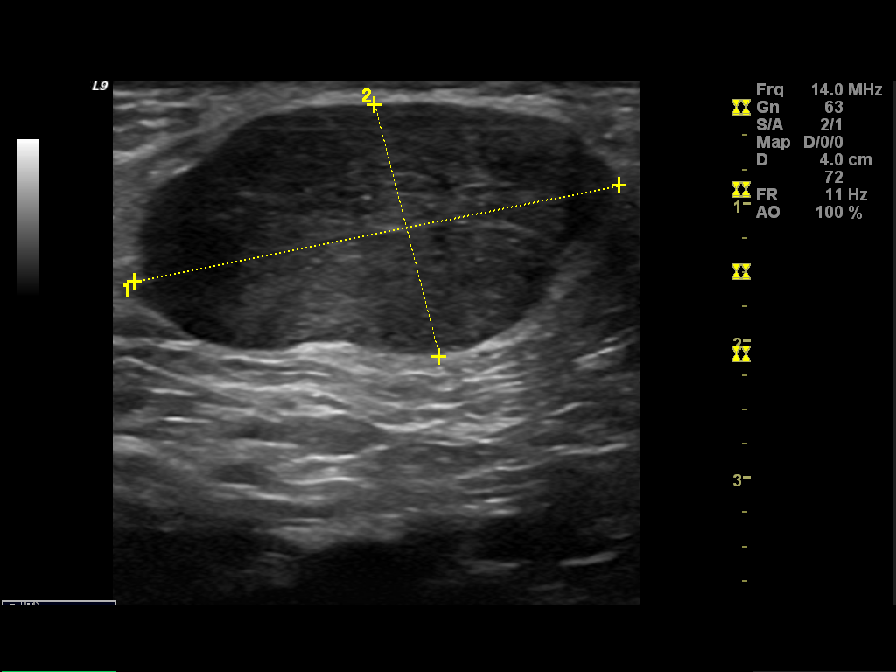
[im 4/8]
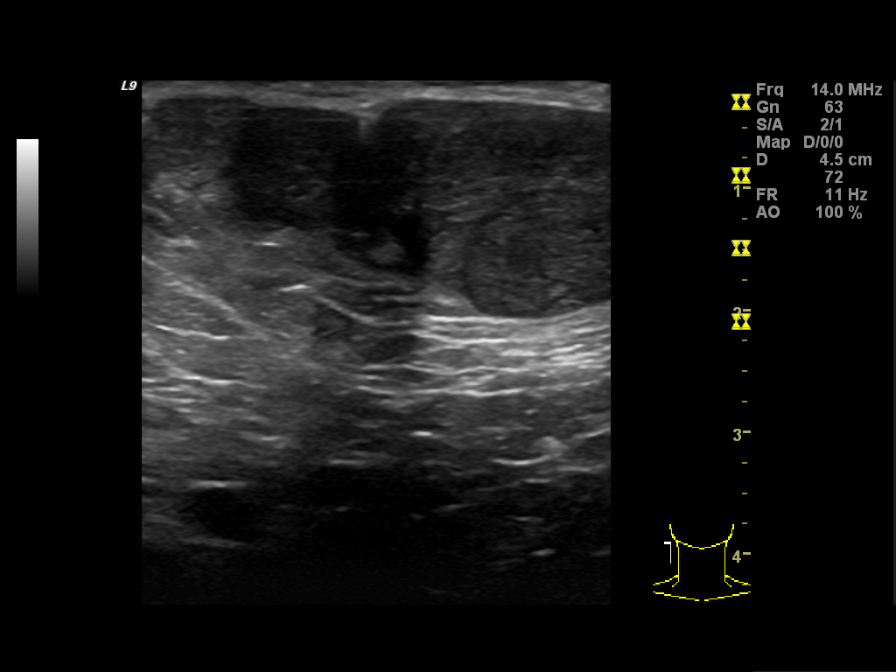
[im 5/8]
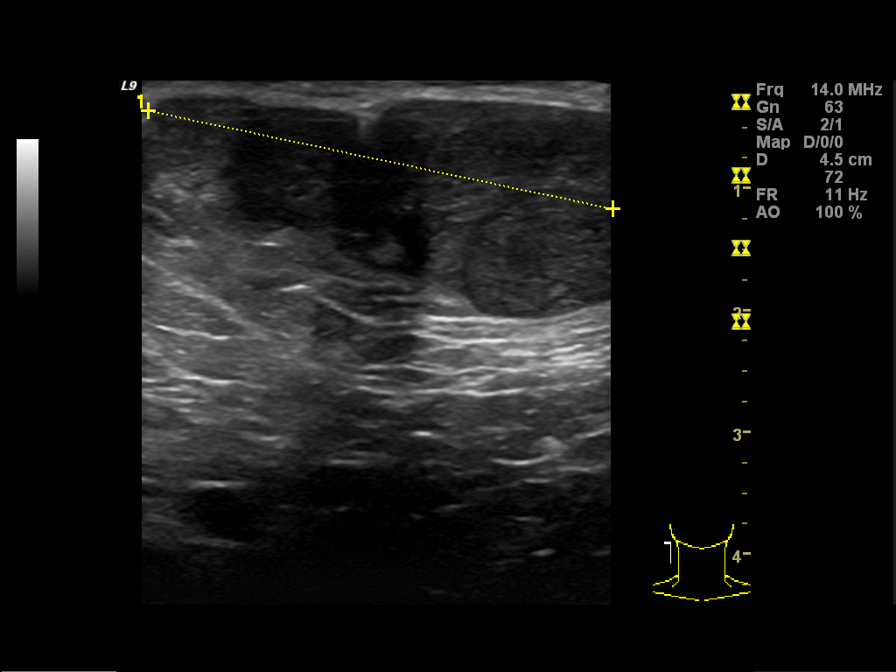
[im 6/8]
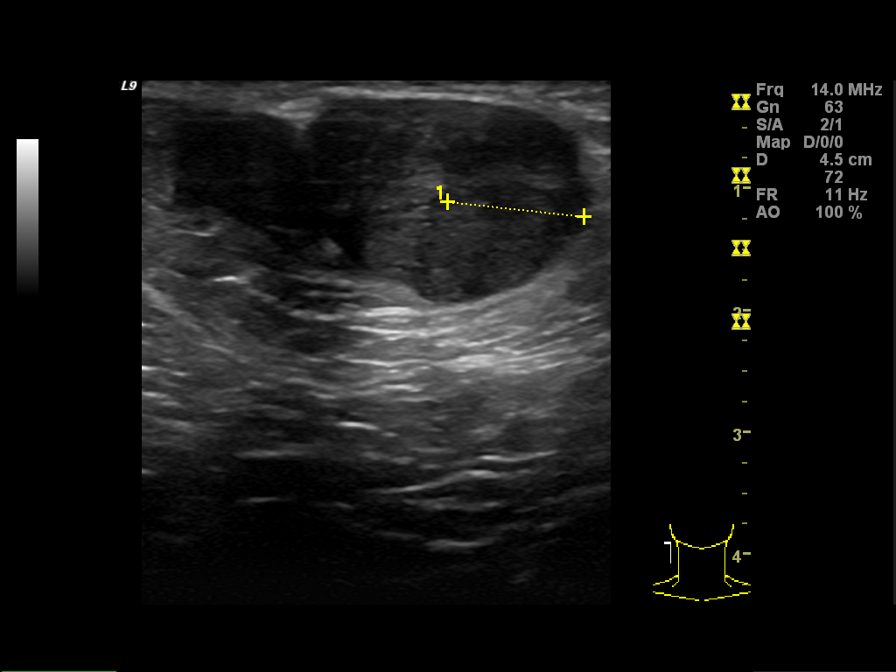
[im 7/8]
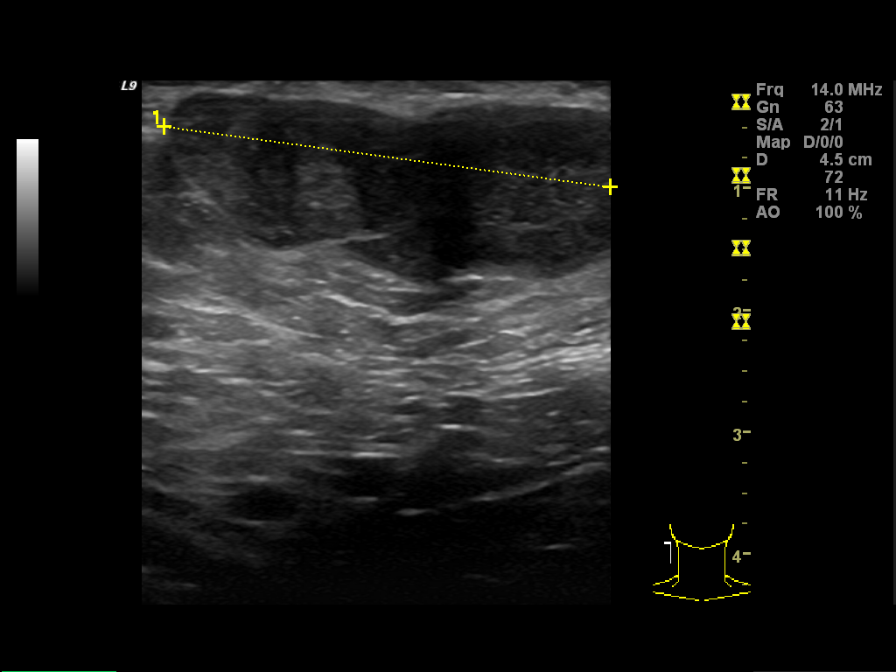
[im 8/8]
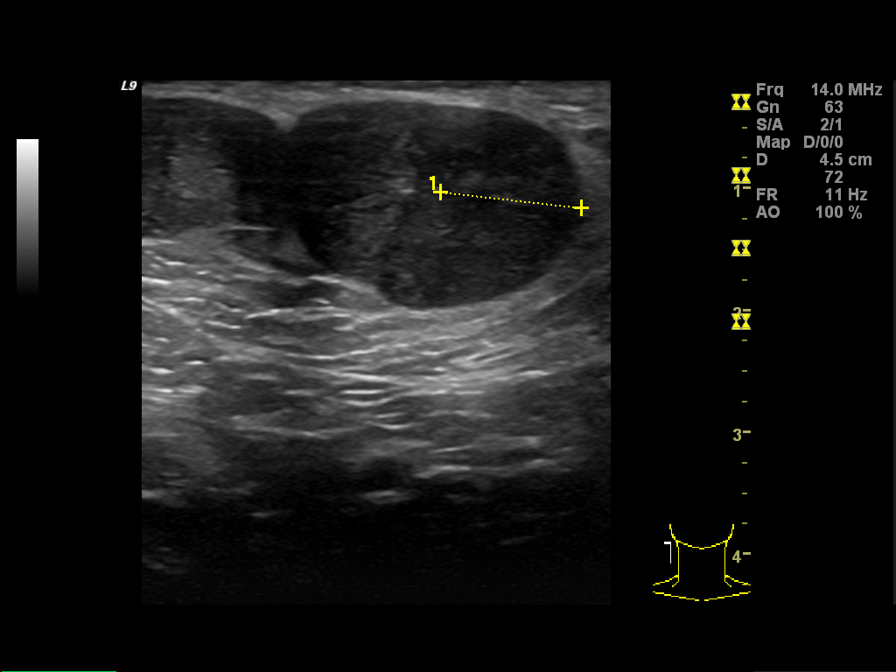

[8 of 8 positions shown; findings below may reference images not displayed]

FINDINGS: There is an oblong circumscribed mass seen within the
left axilla.  This has increased in size when compared to the prior
study.  There are smaller low axillary lymph nodes present with
central fat.  These appear unchanged.  There is a scattered
fibroglandular breast parenchymal pattern noted bilaterally which
appears stable.  There are no worrisome microcalcifications or
areas of distortion.

On physical exam, there is a palpable mobile mass within the left
axilla.

Ultrasound is performed, showing a circumscribed lobulated solid
mass within the left axilla.  This measures 3.6 x 1.9 x 4.8 cm in
size and has increased in size when compared to the prior study.
IMPRESSION: 1.  Interval increase in size of the circumscribed mass within the
left axilla.  Given the interval change, surgical excision is
recommended.

BI-RADS CATEGORY 4:  Suspicious abnormality - biopsy should be
considered.

## 2008-10-19 IMAGING — MG MM DIGITAL DIAGNOSTIC BILAT CAD
4 series · 4 of 4 positions shown · non-contrast
Comparison: Prior studies

CLINICAL DATA: 6-month reevaluation of left breast nodule and
annual reevaluation of the right breast. Previous ultrasound guided
core biopsy of the left axillary mass performed on [DATE]
demonstrated a fibroadenoma.

DIGITAL DIAGNOSTIC  BILATERAL  MAMMOGRAM  WITH CAD AND LEFT BREAST
ULTRASOUND:

[R CC]
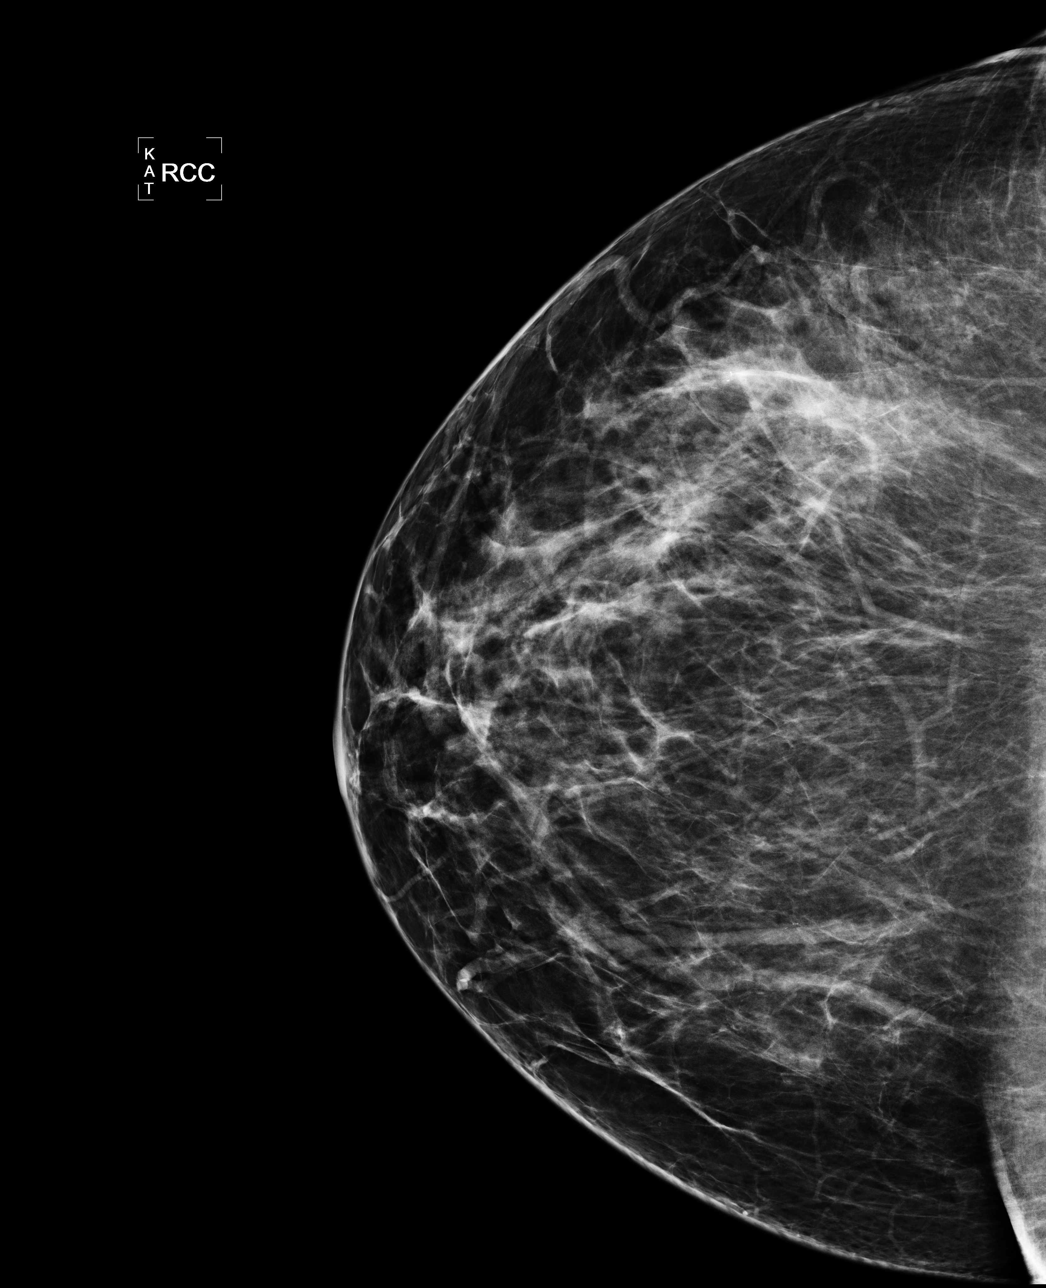

[L CC]
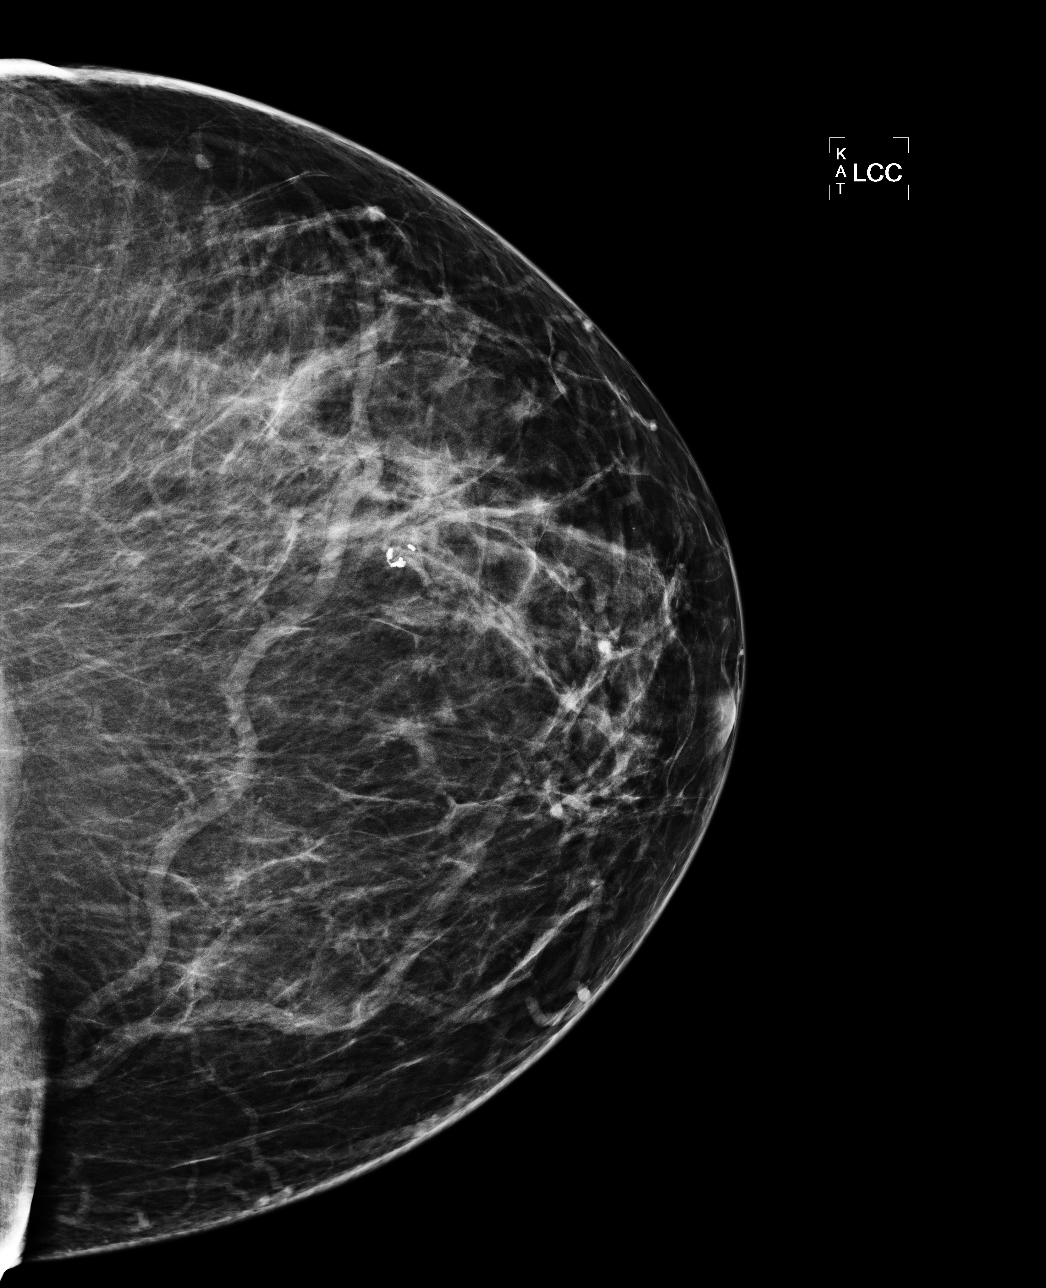

[L MLO]
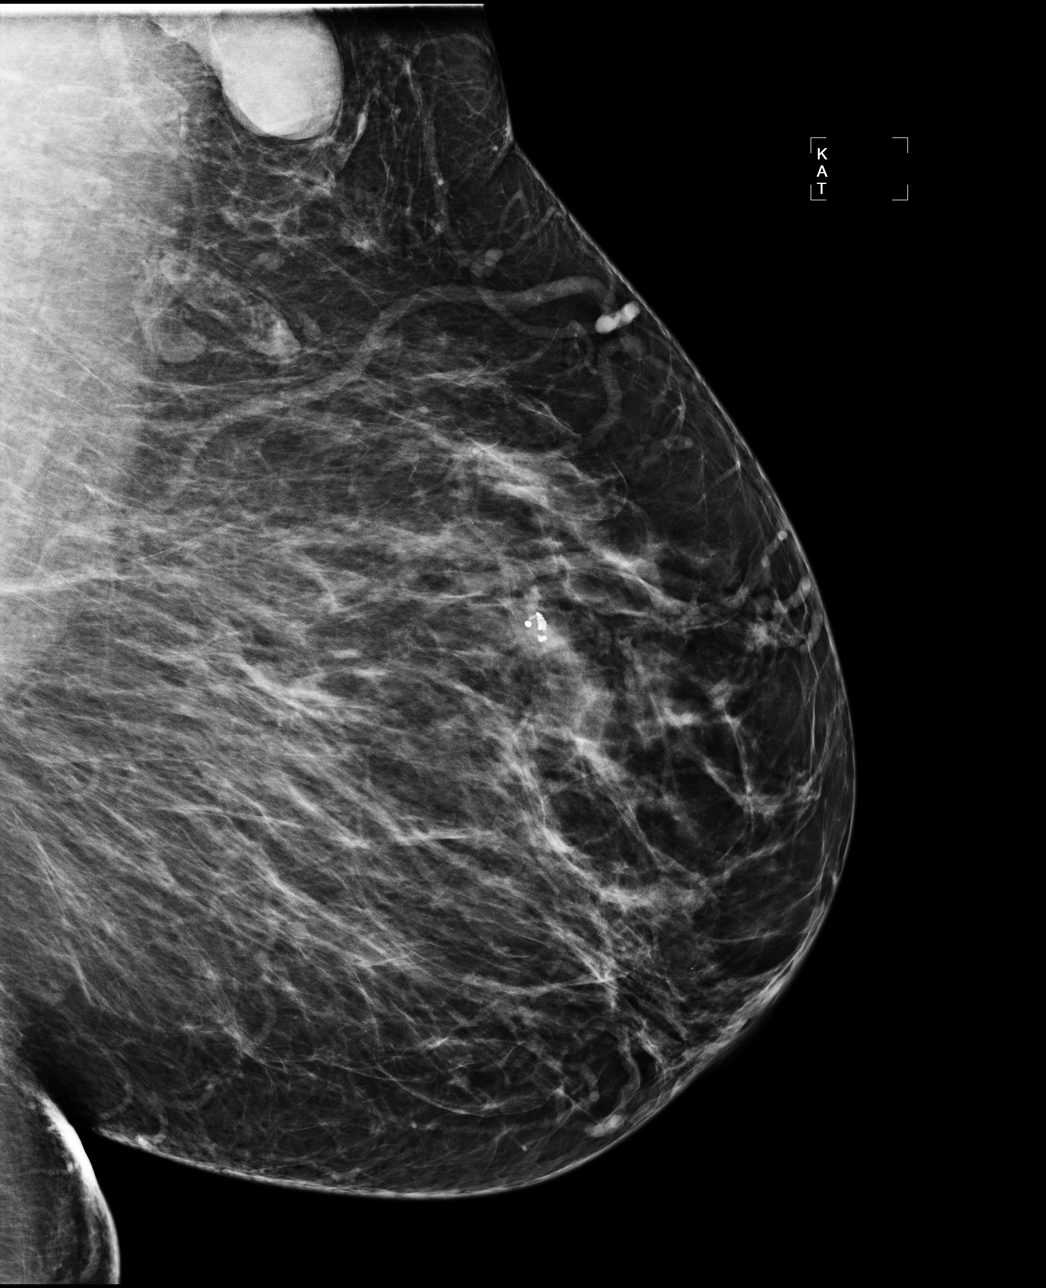

[R MLO]
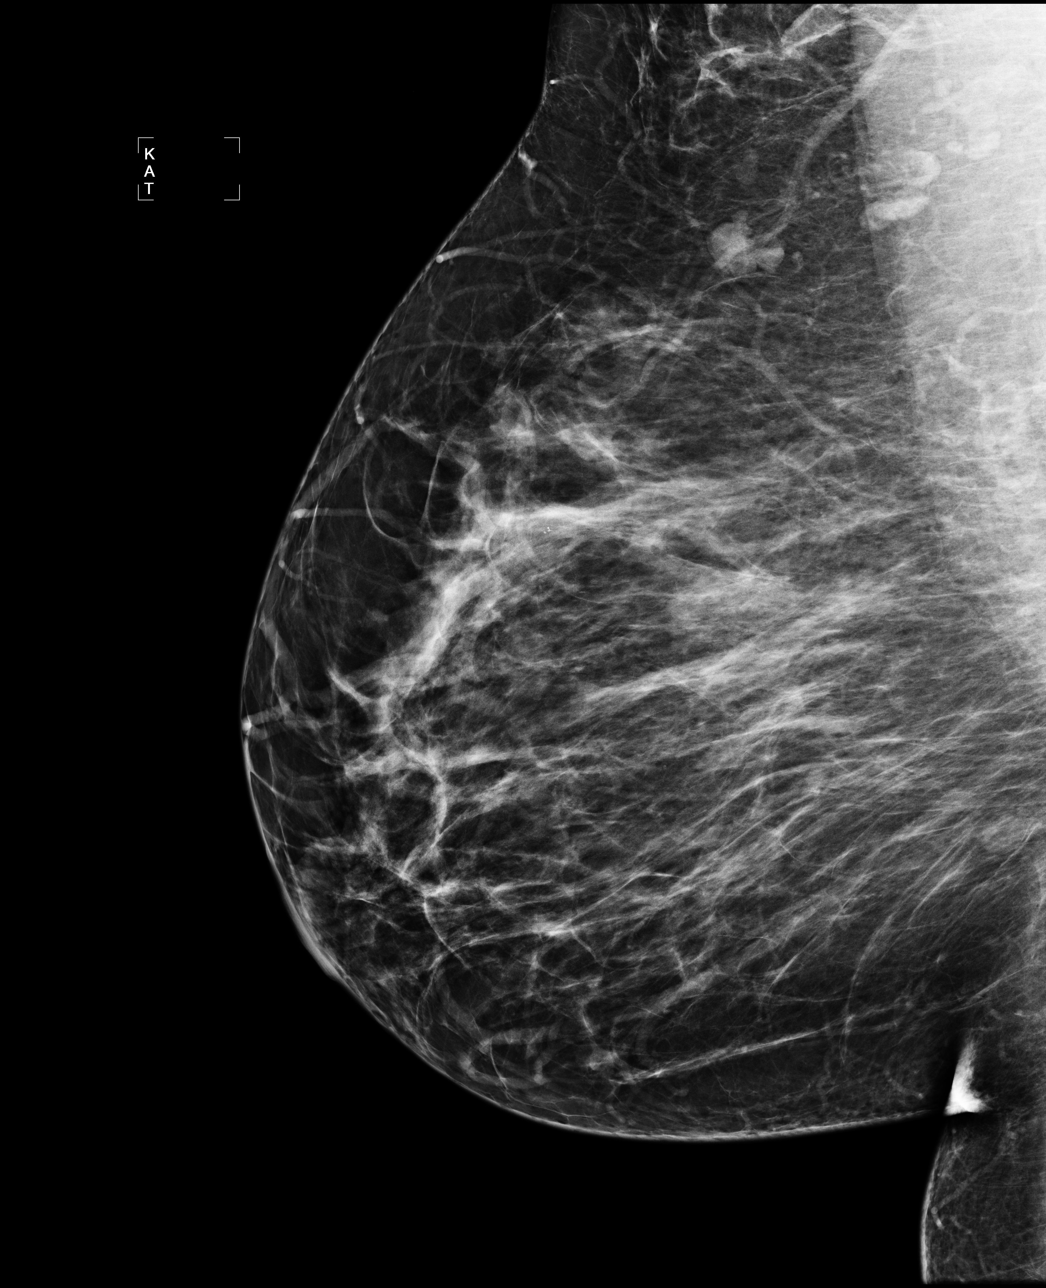

[4 of 4 positions shown; findings below may reference images not displayed]

FINDINGS: There is an oblong circumscribed mass seen within the
left axilla.  This has increased in size when compared to the prior
study.  There are smaller low axillary lymph nodes present with
central fat.  These appear unchanged.  There is a scattered
fibroglandular breast parenchymal pattern noted bilaterally which
appears stable.  There are no worrisome microcalcifications or
areas of distortion.

On physical exam, there is a palpable mobile mass within the left
axilla.

Ultrasound is performed, showing a circumscribed lobulated solid
mass within the left axilla.  This measures 3.6 x 1.9 x 4.8 cm in
size and has increased in size when compared to the prior study.
IMPRESSION: 1.  Interval increase in size of the circumscribed mass within the
left axilla.  Given the interval change, surgical excision is
recommended.

BI-RADS CATEGORY 4:  Suspicious abnormality - biopsy should be
considered.

## 2008-12-31 ENCOUNTER — Encounter: Admission: RE | Admit: 2008-12-31 | Discharge: 2008-12-31 | Payer: Self-pay | Admitting: Surgery

## 2008-12-31 IMAGING — CR DG CHEST 2V
2 series · 2 of 2 positions shown · non-contrast
Comparison: CT of the chest of [DATE]

CLINICAL DATA: Left axillary mass, preop, some shortness of breath

CHEST - 2 VIEW

[w chest pa]
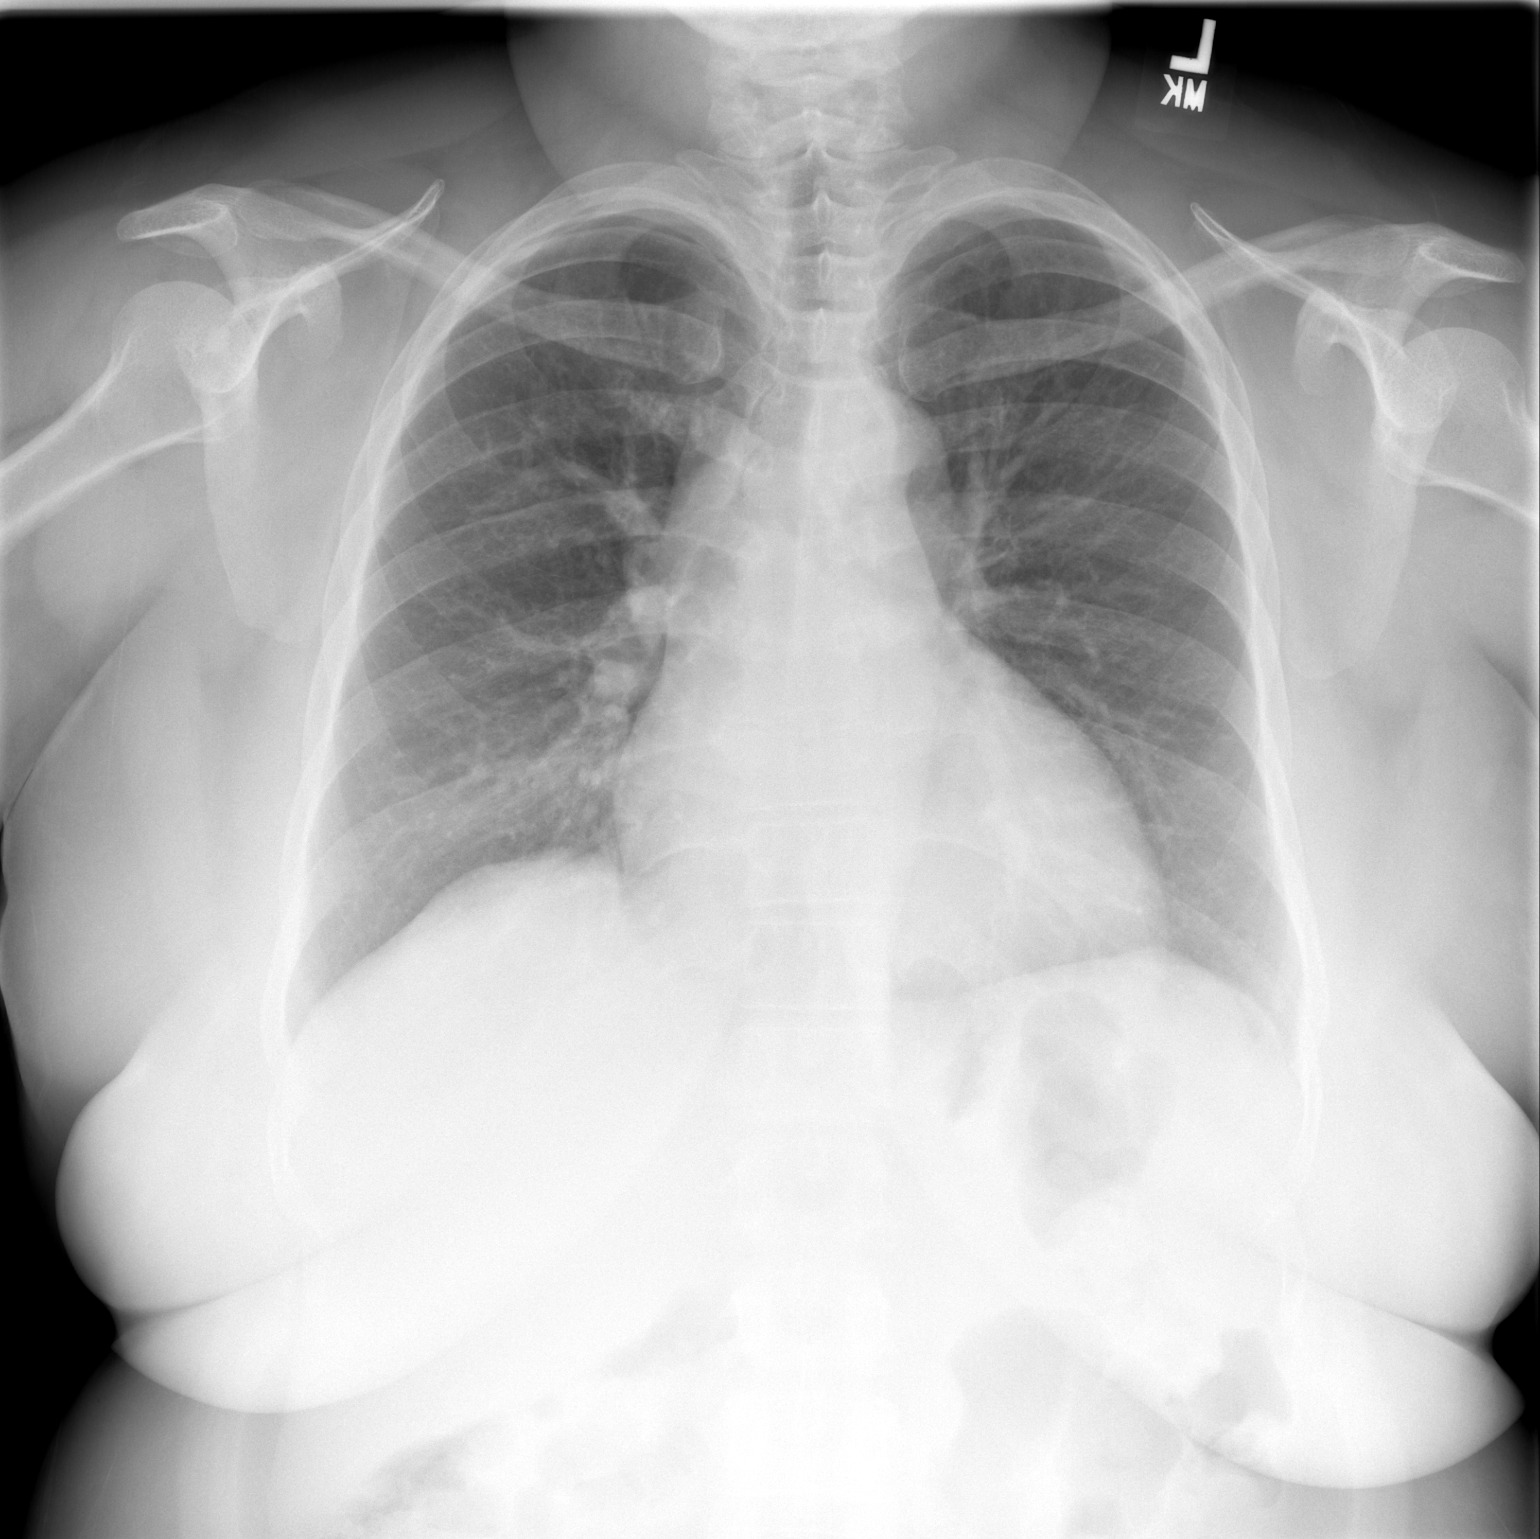

[w chest lat]
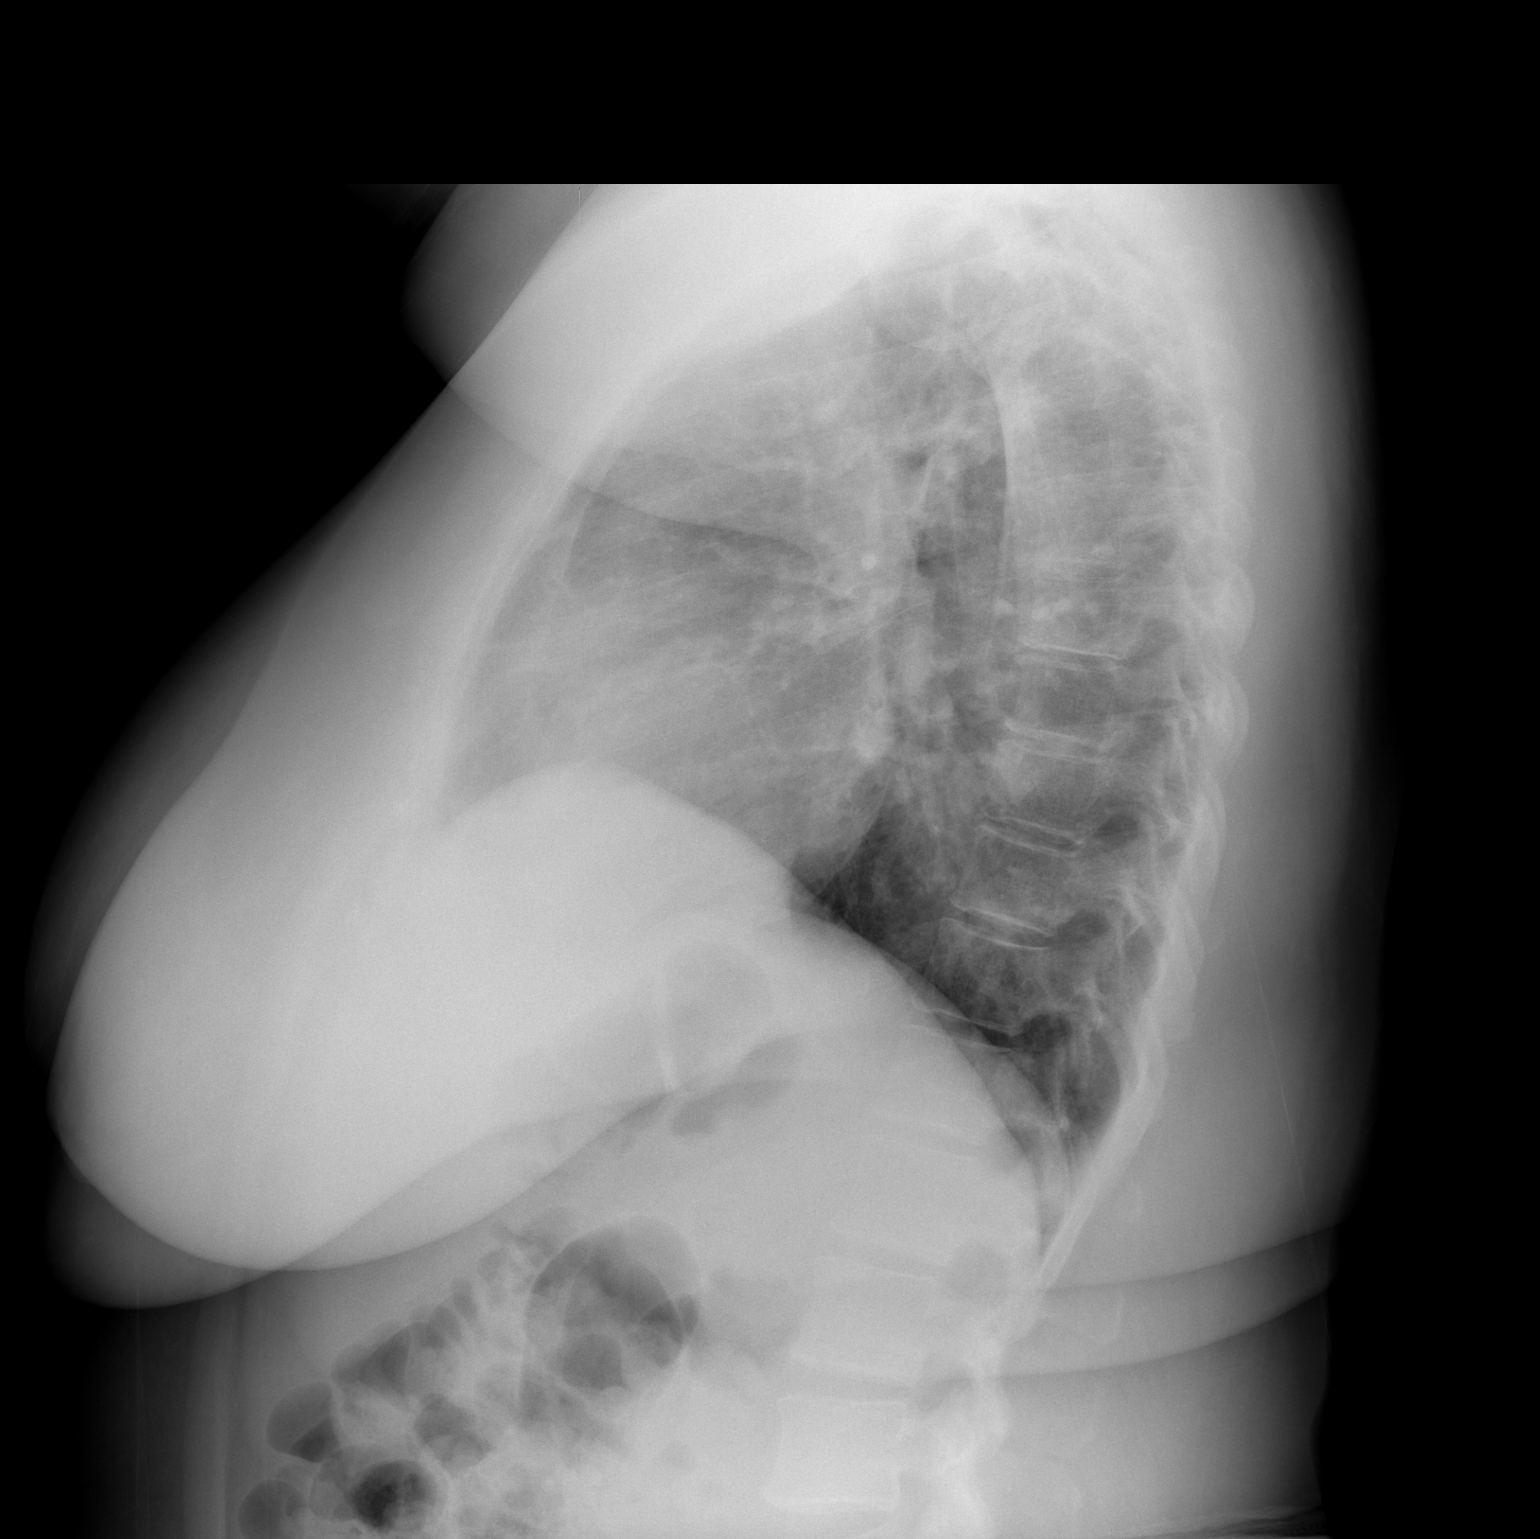

[2 of 2 positions shown; findings below may reference images not displayed]

FINDINGS: The lungs are clear.  The heart is within upper limits of
normal.  No acute bony abnormality is seen.
IMPRESSION: No active lung disease.

## 2009-01-04 ENCOUNTER — Encounter (INDEPENDENT_AMBULATORY_CARE_PROVIDER_SITE_OTHER): Payer: Self-pay | Admitting: Nurse Practitioner

## 2009-01-19 ENCOUNTER — Encounter (INDEPENDENT_AMBULATORY_CARE_PROVIDER_SITE_OTHER): Payer: Self-pay | Admitting: Nurse Practitioner

## 2009-02-02 ENCOUNTER — Telehealth (INDEPENDENT_AMBULATORY_CARE_PROVIDER_SITE_OTHER): Payer: Self-pay | Admitting: Nurse Practitioner

## 2009-04-13 ENCOUNTER — Ambulatory Visit: Payer: Self-pay | Admitting: Nurse Practitioner

## 2009-04-13 DIAGNOSIS — R42 Dizziness and giddiness: Secondary | ICD-10-CM | POA: Insufficient documentation

## 2009-04-13 DIAGNOSIS — E669 Obesity, unspecified: Secondary | ICD-10-CM

## 2009-04-13 DIAGNOSIS — R609 Edema, unspecified: Secondary | ICD-10-CM

## 2009-04-13 LAB — CONVERTED CEMR LAB
Bilirubin Urine: NEGATIVE
Blood Glucose, Fingerstick: 86
Glucose, Urine, Semiquant: NEGATIVE
Ketones, urine, test strip: NEGATIVE
Nitrite: NEGATIVE
Specific Gravity, Urine: 1.015
Urobilinogen, UA: 0.2
pH: 7

## 2009-04-20 LAB — CONVERTED CEMR LAB
ALT: 16 units/L (ref 0–35)
AST: 15 units/L (ref 0–37)
Albumin: 3.8 g/dL (ref 3.5–5.2)
Alkaline Phosphatase: 74 units/L (ref 39–117)
BUN: 14 mg/dL (ref 6–23)
Basophils Absolute: 0 10*3/uL (ref 0.0–0.1)
Basophils Relative: 1 % (ref 0–1)
CO2: 26 meq/L (ref 19–32)
Calcium: 8.6 mg/dL (ref 8.4–10.5)
Chloride: 106 meq/L (ref 96–112)
Cholesterol: 176 mg/dL (ref 0–200)
Creatinine, Ser: 0.67 mg/dL (ref 0.40–1.20)
Eosinophils Absolute: 0.1 10*3/uL (ref 0.0–0.7)
Eosinophils Relative: 1 % (ref 0–5)
Glucose, Bld: 84 mg/dL (ref 70–99)
HCT: 31.1 % — ABNORMAL LOW (ref 36.0–46.0)
HDL: 65 mg/dL (ref >39)
Hemoglobin: 9.3 g/dL — ABNORMAL LOW (ref 12.0–15.0)
LDL Cholesterol: 88 mg/dL (ref 0–99)
Lymphocytes Relative: 32 % (ref 12–46)
Lymphs Abs: 2 10*3/uL (ref 0.7–4.0)
MCHC: 29.9 g/dL — ABNORMAL LOW (ref 30.0–36.0)
MCV: 83.8 fL (ref 78.0–100.0)
Microalb, Ur: 2.13 mg/dL — ABNORMAL HIGH (ref 0.00–1.89)
Monocytes Absolute: 0.5 10*3/uL (ref 0.1–1.0)
Monocytes Relative: 9 % (ref 3–12)
Neutro Abs: 3.6 10*3/uL (ref 1.7–7.7)
Neutrophils Relative %: 58 % (ref 43–77)
Platelets: 224 10*3/uL (ref 150–400)
Potassium: 3.9 meq/L (ref 3.5–5.3)
RBC: 3.71 M/uL — ABNORMAL LOW (ref 3.87–5.11)
RDW: 21.4 % — ABNORMAL HIGH (ref 11.5–15.5)
Sodium: 142 meq/L (ref 135–145)
TSH: 2.104 microintl units/mL (ref 0.350–4.500)
Total Bilirubin: 0.2 mg/dL — ABNORMAL LOW (ref 0.3–1.2)
Total CHOL/HDL Ratio: 2.7
Total Protein: 7 g/dL (ref 6.0–8.3)
Triglycerides: 117 mg/dL (ref <150)
VLDL: 23 mg/dL (ref 0–40)
WBC: 6.2 10*3/uL (ref 4.0–10.5)

## 2009-06-30 ENCOUNTER — Ambulatory Visit: Payer: Self-pay | Admitting: Nurse Practitioner

## 2009-06-30 DIAGNOSIS — M25569 Pain in unspecified knee: Secondary | ICD-10-CM

## 2009-08-31 ENCOUNTER — Ambulatory Visit: Payer: Self-pay | Admitting: Nurse Practitioner

## 2010-01-26 ENCOUNTER — Ambulatory Visit: Payer: Self-pay | Admitting: Nurse Practitioner

## 2010-01-26 DIAGNOSIS — N3000 Acute cystitis without hematuria: Secondary | ICD-10-CM | POA: Insufficient documentation

## 2010-01-26 LAB — CONVERTED CEMR LAB
Bilirubin Urine: NEGATIVE
Glucose, Urine, Semiquant: NEGATIVE
KOH Prep: NEGATIVE
Ketones, urine, test strip: NEGATIVE
Nitrite: NEGATIVE
Protein, U semiquant: NEGATIVE
Specific Gravity, Urine: 1.015
Urobilinogen, UA: 0.2
pH: 6.5

## 2010-01-27 ENCOUNTER — Encounter (INDEPENDENT_AMBULATORY_CARE_PROVIDER_SITE_OTHER): Payer: Self-pay | Admitting: Nurse Practitioner

## 2010-03-16 ENCOUNTER — Ambulatory Visit: Payer: Self-pay | Admitting: Nurse Practitioner

## 2010-03-16 DIAGNOSIS — R3129 Other microscopic hematuria: Secondary | ICD-10-CM

## 2010-03-16 DIAGNOSIS — B379 Candidiasis, unspecified: Secondary | ICD-10-CM | POA: Insufficient documentation

## 2010-03-16 DIAGNOSIS — S335XXA Sprain of ligaments of lumbar spine, initial encounter: Secondary | ICD-10-CM

## 2010-03-16 LAB — CONVERTED CEMR LAB
Bilirubin Urine: NEGATIVE
Glucose, Urine, Semiquant: NEGATIVE
KOH Prep: NEGATIVE
Ketones, urine, test strip: NEGATIVE
Nitrite: NEGATIVE
Protein, U semiquant: NEGATIVE
Specific Gravity, Urine: >=1.03
Urobilinogen, UA: 0.2
WBC Urine, dipstick: NEGATIVE
pH: 6

## 2010-03-17 ENCOUNTER — Encounter (INDEPENDENT_AMBULATORY_CARE_PROVIDER_SITE_OTHER): Payer: Self-pay | Admitting: Nurse Practitioner

## 2010-03-23 ENCOUNTER — Telehealth (INDEPENDENT_AMBULATORY_CARE_PROVIDER_SITE_OTHER): Payer: Self-pay | Admitting: Nurse Practitioner

## 2010-09-08 ENCOUNTER — Telehealth (INDEPENDENT_AMBULATORY_CARE_PROVIDER_SITE_OTHER): Payer: Self-pay | Admitting: Nurse Practitioner

## 2010-09-10 ENCOUNTER — Encounter: Payer: Self-pay | Admitting: Obstetrics and Gynecology

## 2010-09-17 LAB — CONVERTED CEMR LAB

## 2010-09-19 NOTE — Letter (Signed)
Summary: Work Excuse  [facility]  [address] [place], [place] [address]   Phone: [phone]  Fax: [phone]    Today's Date: August 31, 2009  Name of Patient: JOYCELYNN FRITSCHE  The above named patient had a medical visit today   Please take this into consideration when reviewing the time away from work  Special Instructions:  [  ] None  [  X] To be off the remainder of today, returning to the normal work / school schedule tomorrow - September 01, 2009.  [  ] To be off until the next scheduled appointment on ______________________.  [  ] Other ________________________________________________________________ ________________________________________________________________________   Sincerely yours,   Lehman Prom FNP [facility]

## 2010-09-19 NOTE — Assessment & Plan Note (Signed)
Summary: HTN/Asthma   Vital Signs:  Patient profile:   45 year old female LMP:     08/10/2009 Weight:      237.9 pounds BMI:     46.24 BSA:     2.02 O2 Sat:      99 % on Room air Temp:     97.9 degrees F oral Pulse rate:   96 / minute Pulse rhythm:   regular Resp:     20 per minute BP sitting:   140 / 90  (left arm) Cuff size:   large  Vitals Entered By: Levon Hedger (August 31, 2009 3:27 PM)  O2 Flow:  Room air  Serial Vital Signs/Assessments:  Comments: 4:26 PM p/f  280,  280,  280 By: Levon Hedger   CC: complains of dizzy spells, she said that she passed out at work on Monday for about 15 -20 minutes....back pain, Hypertension Management Is Patient Diabetic? No Pain Assessment Patient in pain? no       Does patient need assistance? Functional Status Self care Ambulation Normal Comments pt has been without BP meds since December and just started back taking in January. LMP (date): 08/10/2009 LMP - Character: heavy     Enter LMP: 08/10/2009   CC:  complains of dizzy spells, she said that she passed out at work on Monday for about 15 -20 minutes....back pain, and Hypertension Management.  History of Present Illness:  Pt into the office still with c/o dizziness. Reports that when she was at work earlier this week she "passed" out for 15 minutes. She was not allowed to return back to work until cleared from the provider. Dizziness also occurs at home too. Dizziness always occurs when she is up moving around.  Never when she is sitting.  Back pain - still ongoing.   Employed in a nursing home.  Taking diclofenac 75mg  - 3 pills at one time, which does cause some relief. Admits to bending and straining of lower back while at work  Asthma History    Asthma Control Assessment:    Age range: 12+ years    Symptoms: throughout the day    Nighttime Awakenings: 0-2/month    Interferes w/ normal activity: some limitations    SABA use (not for EIB): 0-2  days/week    Asthma Control Assessment: Very Poorly Controlled  Hypertension History:      She denies headache, chest pain, and palpitations.  She notes no problems with any antihypertensive medication side effects.  Pt is taking BP meds - just started taking meds for 1 week. She had refills but due to cost she was not able to get the med from the pharmacy.        Positive major cardiovascular risk factors include hypertension.  Negative major cardiovascular risk factors include female age less than 36 years old, negative family history for ischemic heart disease, and non-tobacco-user status.        Further assessment for target organ damage reveals no history of ASHD, cardiac end-organ damage (CHF/LVH), stroke/TIA, peripheral vascular disease, renal insufficiency, or hypertensive retinopathy.       Habits & Providers  Alcohol-Tobacco-Diet     Alcohol drinks/day: <1     Alcohol type: wine     Tobacco Status: never  Exercise-Depression-Behavior     Does Patient Exercise: yes     Exercise Counseling: to improve exercise regimen     Type of exercise: walking     Exercise (avg: min/session): <30  Have you felt down or hopeless? no     Have you felt little pleasure in things? no     Depression Counseling: not indicated; screening negative for depression     Drug Use: no     Seat Belt Use: 100     Sun Exposure: occasionally  Allergies (verified): No Known Drug Allergies  Review of Systems General:  dizziness. ENT:  Complains of nasal congestion. CV:  Denies chest pain or discomfort. Resp:  Complains of cough, shortness of breath, and wheezing; increasing. More usage of MDI over the past week.  No tobacco use. GI:  Denies abdominal pain, nausea, and vomiting. GU:  Complains of incontinence; with cough.  Physical Exam  General:  alert.   Head:  normocephalic.   Ears:  left TM with moderate cerumen - unable to visualize TM Right RM with top of TM visible. No erythema Nose:   nasal congestion Lungs:  few scattered wheezes Heart:  normal rate and regular rhythm.   Abdomen:  soft, non-tender, and normal bowel sounds.   Neurologic:  alert & oriented X3.   Skin:  color normal.   Psych:  Oriented X3.     Impression & Recommendations:  Problem # 1:  DIZZINESS (ICD-780.4) maintain hydration need to relieve nasal congestion Her updated medication list for this problem includes:    Meclizine Hcl 25 Mg Tabs (Meclizine hcl) ..... One tablet by mouth daily as needed for dizziness  Problem # 2:  ASTHMA (ICD-493.90)  start advair - sample given Her updated medication list for this problem includes:    Singulair 10 Mg Tabs (Montelukast sodium) .Marland Kitchen... 1 tablet by mouth nightly for asthma    Ventolin Hfa 108 (90 Base) Mcg/act Aers (Albuterol sulfate) .Marland Kitchen... 2 puffs every 6 hours as needed for shortness of breath **pharmacy - d/c proair**    Advair Diskus 100-50 Mcg/dose Aepb (Fluticasone-salmeterol) ..... One inhalation two times a day  **rinse mouth after use**  Orders: Pulse Oximetry (single measurment) (94760) Peak Flow Rate (94150)  Problem # 3:  ALLERGIC RHINITIS (ICD-477.9) restart singulair zyrtec samples given x 3  Problem # 4:  HYPERTENSION, BENIGN ESSENTIAL (ICD-401.1) BP is still elevated.  Advised pt to take meds as ordered DASH diet Her updated medication list for this problem includes:    Lisinopril-hydrochlorothiazide 20-25 Mg Tabs (Lisinopril-hydrochlorothiazide) .Marland Kitchen... 1 tablet by mouth daily for blood pressure  Complete Medication List: 1)  Lisinopril-hydrochlorothiazide 20-25 Mg Tabs (Lisinopril-hydrochlorothiazide) .Marland Kitchen.. 1 tablet by mouth daily for blood pressure 2)  Hydroxyzine Hcl 25 Mg Tabs (Hydroxyzine hcl) .Marland Kitchen.. 1 tablet by mouth daily as needed for itching 3)  Singulair 10 Mg Tabs (Montelukast sodium) .Marland Kitchen.. 1 tablet by mouth nightly for asthma 4)  Ventolin Hfa 108 (90 Base) Mcg/act Aers (Albuterol sulfate) .... 2 puffs every 6 hours as needed  for shortness of breath **pharmacy - d/c proair** 5)  Ferrous Sulfate 325 (65 Fe) Mg Tbec (Ferrous sulfate) .Marland Kitchen.. 1 tablet by mouth two times a day to build up blood 6)  Meclizine Hcl 25 Mg Tabs (Meclizine hcl) .... One tablet by mouth daily as needed for dizziness 7)  Nu-iron 150 Mg Caps (Polysaccharide iron complex) .... One tablet by mouth daily 8)  Diclofenac Sodium 75 Mg Tbec (Diclofenac sodium) .... One tablet by mouth two times a day for joints 9)  Advair Diskus 100-50 Mcg/dose Aepb (Fluticasone-salmeterol) .... One inhalation two times a day  **rinse mouth after use**  Hypertension Assessment/Plan:      The  patient's hypertensive risk group is category A: No risk factors and no target organ damage.  Her calculated 10 year risk of coronary heart disease is 3 %.  Today's blood pressure is 140/90.  Her blood pressure goal is < 140/90.  Patient Instructions: 1)  Your blood pressure is 140/90 today.  You need to avoid salty foods and take your medications as ordered.  Blood pressure goal is 135/85. 2)  Dizziness - Be sure you drink plenty of water 3)  Perhaps due to congestion and asthma exacerbation. 4)  Advair 100/50 - 1 inhalation two times a day (rinse mouth after use) 5)  Take Zyrtec by mouth daily x 3 days. 6)  Follow up in this office in 3 week for dizziness. 7)  Will need ear irrigation of left ear.  Take your blood pressure medications before this visit.  will need o2 sat. Prescriptions: ADVAIR DISKUS 100-50 MCG/DOSE AEPB (FLUTICASONE-SALMETEROL) One inhalation two times a day  **rinse mouth after use**  #1 x 0   Entered and Authorized by:   Lehman Prom FNP   Signed by:   Lehman Prom FNP on 08/31/2009   Method used:   Samples Given   RxID:   [id number]

## 2010-09-19 NOTE — Letter (Signed)
Summary: Handout Printed  Printed Handout:  - Muscle Strain (Pulled Muscle) 

## 2010-09-19 NOTE — Assessment & Plan Note (Signed)
Summary: Acute - Back Pain   Vital Signs:  Patient profile:   45 year old female LMP:     03/03/2010 Weight:      229.5 pounds BMI:     44.61 Temp:     98.1 degrees F oral Pulse rate:   89 / minute Pulse rhythm:   regular Resp:     20 per minute BP sitting:   145 / 89  (left arm) Cuff size:   large  Vitals Entered By: Levon Hedger (March 16, 2010 12:20 PM) CC: pain in right hip up around her back all on one side and has been this way since last night..having vaginal discharge with urinary pain , Hypertension Management, Back Pain Pain Assessment Patient in pain? yes     Location: hip Intensity: 9 Type: burning Onset of pain  Constant  Does patient need assistance? Functional Status Self care Ambulation Normal Comments pt has not been taking medication and has not been able to afford medication.Marland Kitchen LMP (date): 03/03/2010 LMP - Character: heavy     Enter LMP: 03/03/2010   CC:  pain in right hip up around her back all on one side and has been this way since last night..having vaginal discharge with urinary pain , Hypertension Management, and Back Pain.  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   Sharp pain started with a burning cessation down the right leg. Prior to last night back pain was not present. She did NOT leave work - she was able to finish her shift  Back Pain History:      The patient's back pain has been present for < 6 weeks.  The pain is located in the lower back region and does not radiate below the knees.  She states this is work related.  She states that she has no prior history of back pain.  The patient has not had any recent physical therapy for her back pain.    Critical Exclusionary Diagnosis Criteria (CEDC) for Back Pain:      The patient denies a history of previous trauma.  She has no prior history of spinal surgery.  There are no symptoms to  suggest infection, cancer, cauda equina, or psychosocial factors for back pain.  Other positive CEDC factors include low back pain worse with activity.    Hypertension History:      She denies headache, chest pain, and palpitations.  She notes no problems with any antihypertensive medication side effects.        Positive major cardiovascular risk factors include hypertension.  Negative major cardiovascular risk factors include female age less than 35 years old, negative family history for ischemic heart disease, and non-tobacco-user status.        Further assessment for target organ damage reveals no history of ASHD, cardiac end-organ damage (CHF/LVH), stroke/TIA, peripheral vascular disease, renal insufficiency, or hypertensive retinopathy.      Allergies (verified): No Known Drug Allergies  Review of Systems GI:  Denies abdominal pain, nausea, and vomiting. GU:  Complains of discharge, dysuria, and urinary frequency; denies hematuria and nocturia; Pt treated for UTI on her last visit. She took all the Bactrim and symptoms improved.  Then on 03/08/2010 symptoms re-occured.. MS:  Complains of low back pain.  Physical Exam  General:  alert.  obese Head:  normocephalic.   Lungs:  normal breath sounds.   Heart:  normal rate  and regular rhythm.   Neurologic:  alert & oriented X3.   Psych:  Oriented X3.     Detailed Back/Spine Exam  Lumbosacral Exam:  Inspection-deformity:    Normal Palpation-spinal tenderness:  Normal Sitting Straight Leg Raise:    Right:  negative    Left:  negative   Impression & Recommendations:  Problem # 1:  BACK STRAIN, LUMBAR (ICD-847.2) advised pt to start anti-inflammatory muscle relaxer as needed  heat to affected area  Problem # 2:  MICROSCOPIC HEMATURIA (ICD-599.72) will send urine culture to the lab The following medications were removed from the medication list:    Bactrim 400-80 Mg Tabs (Sulfamethoxazole-trimethoprim) ..... One tablet by  mouth two times a day for infection  Orders: UA Dipstick w/o Micro (manual) (60454) KOH/ WET Mount [phone]) T-Culture, Urine ([id number])  Problem # 3:  CANDIDIASIS (ICD-112.9) noted on WP will treat with diflucan  Complete Medication List: 1)  Lisinopril-hydrochlorothiazide 20-25 Mg Tabs (Lisinopril-hydrochlorothiazide) .Marland Kitchen.. 1 tablet by mouth daily for blood pressure 2)  Hydroxyzine Hcl 25 Mg Tabs (Hydroxyzine hcl) .Marland Kitchen.. 1 tablet by mouth daily as needed for itching 3)  Singulair 10 Mg Tabs (Montelukast sodium) .Marland Kitchen.. 1 tablet by mouth nightly for asthma 4)  Ventolin Hfa 108 (90 Base) Mcg/act Aers (Albuterol sulfate) .... 2 puffs every 6 hours as needed for shortness of breath **pharmacy - d/c proair** 5)  Ferrous Sulfate 325 (65 Fe) Mg Tbec (Ferrous sulfate) .Marland Kitchen.. 1 tablet by mouth two times a day to build up blood 6)  Meclizine Hcl 25 Mg Tabs (Meclizine hcl) .... One tablet by mouth daily as needed for dizziness 7)  Nu-iron 150 Mg Caps (Polysaccharide iron complex) .... One tablet by mouth daily 8)  Advair Diskus 100-50 Mcg/dose Aepb (Fluticasone-salmeterol) .... One inhalation two times a day  **rinse mouth after use** 9)  Naproxen 500 Mg Tabs (Naproxen) .... One tablet by mouth two times a day as needed for back 10)  Flexeril 10 Mg Tabs (Cyclobenzaprine hcl) .... One tablet by mouth nightly as needed for muscles 11)  Fluconazole 150 Mg Tabs (Fluconazole) .... One tablet by mouth x 1 dose  Hypertension Assessment/Plan:      The patient's hypertensive risk group is category A: No risk factors and no target organ damage.  Her calculated 10 year risk of coronary heart disease is 3 %.  Today's blood pressure is 145/89.  Her blood pressure goal is < 140/90.  Patient Instructions: 1)  Muscle sprain - in back 2)  You should start naprosyn 500mg  by mouth two times a day (take with food) STOP DICLOFENAC 3)  Muscle relaxer - Flexeril 10mg ; take one of these today before you go to work. You will  need to sleep at least 6-8 hours because this medication will make you sleepy 4)  Apply a heat pad to your lower back 5)  Urine - will be sent to lab to check for infection 6)  Will treat with flucoazole for a yeast infection 7)  Follow up as needed Prescriptions: FLUCONAZOLE 150 MG TABS (FLUCONAZOLE) One tablet by mouth x 1 dose  #1 x 0   Entered and Authorized by:   Lehman Prom FNP   Signed by:   Lehman Prom FNP on 03/16/2010   Method used:   Print then Give to Patient   RxID:   [id number] FLEXERIL 10 MG TABS (CYCLOBENZAPRINE HCL) One tablet by mouth nightly as needed for muscles  #20 x 0   Entered and Authorized by:  Lehman Prom FNP   Signed by:   Lehman Prom FNP on 03/16/2010   Method used:   Print then Give to Patient   RxID:   [id number] NAPROXEN 500 MG TABS (NAPROXEN) One tablet by mouth two times a day as needed for back  #50 x 0   Entered and Authorized by:   Lehman Prom FNP   Signed by:   Lehman Prom FNP on 03/16/2010   Method used:   Print then Give to Patient   RxID:   [id number] FLUCONAZOLE 150 MG TABS (FLUCONAZOLE) One tablet by mouth x 1 dose  #1 x 0   Entered and Authorized by:   Lehman Prom FNP   Signed by:   Lehman Prom FNP on 03/16/2010   Method used:   Print then Give to Patient   RxID:   [id number] FLEXERIL 10 MG TABS (CYCLOBENZAPRINE HCL) One tablet by mouth nightly as needed for muscles  #20 x 0   Entered and Authorized by:   Lehman Prom FNP   Signed by:   Lehman Prom FNP on 03/16/2010   Method used:   Print then Give to Patient   RxID:   [id number] NAPROXEN 500 MG TABS (NAPROXEN) One tablet by mouth two times a day as needed for back  #50 x 0   Entered and Authorized by:   Lehman Prom FNP   Signed by:   Lehman Prom FNP on 03/16/2010   Method used:   Print then Give to Patient   RxID:   [id number]   Laboratory Results   Urine Tests  Date/Time Received: March 16, 2010 12:53 PM   Routine Urinalysis   Color: cloudy Glucose: negative   (Normal Range: Negative) Bilirubin: negative   (Normal Range: Negative) Ketone: negative   (Normal Range: Negative) Spec. Gravity: >=1.030   (Normal Range: 1.003-1.035) Blood: large   (Normal Range: Negative) pH: 6.0   (Normal Range: 5.0-8.0) Protein: negative   (Normal Range: Negative) Urobilinogen: 0.2   (Normal Range: 0-1) Nitrite: negative   (Normal Range: Negative) Leukocyte Esterace: negative   (Normal Range: Negative)    Comments: ? pt is getting ready to start on menses Date/Time Received: March 16, 2010   [facility] Source: vaginal WBC/hpf: 1-5 Bacteria/hpf: rare Clue cells/hpf: none Yeast/hpf: few Wet Mount KOH: Negative Trichomonas/hpf: none

## 2010-09-19 NOTE — Assessment & Plan Note (Signed)
Summary: Acute Cystitis   Vital Signs:  Patient profile:   45 year old female LMP:     12/2009 Weight:      233.5 pounds Temp:     98.3 degrees F oral Pulse rate:   99 / minute Pulse rhythm:   regular Resp:     20 per minute BP sitting:   167 / 99  (left arm)  Vitals Entered By: Levon Hedger (January 26, 2010 3:17 PM) CC: x 3 weeks vaginal discharge, pain, discomfort and lower abdominal pain, Dysuria Is Patient Diabetic? No Pain Assessment Patient in pain? yes     Location: lower abdomen  Does patient need assistance? Functional Status Self care Ambulation Normal LMP (date): 12/2009 LMP - Character: heavy     Enter LMP: 12/2009   CC:  x 3 weeks vaginal discharge, pain, discomfort and lower abdominal pain, and Dysuria.  History of Present Illness:  Dysuria      This is a 45 year old woman who presents with Dysuria.  The symptoms began 2 weeks ago.  The intensity is described as moderate.  The patient complains of burning with urination, urinary frequency, and vaginal discharge, but denies vaginal itching.  Associated symptoms include abdominal pain.  The patient denies the following associated symptoms: vomiting and fever.  The patient denies the following risk factors: diabetes and prior antibiotics.    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 Resp:  Denies cough. GI:  Complains of abdominal pain; denies nausea and vomiting. GU:  Complains of discharge and dysuria.  Physical Exam  General:  alert.   Head:  normocephalic.   Lungs:  normal breath sounds.   Heart:  normal rate and regular rhythm.   Abdomen:  bil lower abd tenderness Msk:  up to the exam table Neurologic:  alert & oriented X3.   Skin:  color normal.   Psych:  Oriented X3.     Impression & Recommendations:  Problem # 1:  ACUTE CYSTITIS (ICD-595.0) advised pt to drink  plenty of water will send urine for culture out of work tonight Her updated medication list for this problem includes:    Bactrim 400-80 Mg Tabs (Sulfamethoxazole-trimethoprim) ..... One tablet by mouth two times a day for infection  Orders: UA Dipstick w/o Micro (manual) (13086) KOH/ WET Mount [phone]) T-Culture, Urine [id number])  Complete Medication List: 1)  Lisinopril-hydrochlorothiazide 20-25 Mg Tabs (Lisinopril-hydrochlorothiazide) .Marland Kitchen.. 1 tablet by mouth daily for blood pressure 2)  Hydroxyzine Hcl 25 Mg Tabs (Hydroxyzine hcl) .Marland Kitchen.. 1 tablet by mouth daily as needed for itching 3)  Singulair 10 Mg Tabs (Montelukast sodium) .Marland Kitchen.. 1 tablet by mouth nightly for asthma 4)  Ventolin Hfa 108 (90 Base) Mcg/act Aers (Albuterol sulfate) .... 2 puffs every 6 hours as needed for shortness of breath **pharmacy - d/c proair** 5)  Ferrous Sulfate 325 (65 Fe) Mg Tbec (Ferrous sulfate) .Marland Kitchen.. 1 tablet by mouth two times a day to build up blood 6)  Meclizine Hcl 25 Mg Tabs (Meclizine hcl) .... One tablet by mouth daily as needed for dizziness 7)  Nu-iron 150 Mg Caps (Polysaccharide iron complex) .... One tablet by mouth daily 8)  Diclofenac Sodium 75 Mg Tbec (Diclofenac sodium) .... One tablet by mouth two times a day for joints 9)  Advair Diskus 100-50 Mcg/dose Aepb (Fluticasone-salmeterol) .... One inhalation two times a  day  **rinse mouth after use** 10)  Bactrim 400-80 Mg Tabs (Sulfamethoxazole-trimethoprim) .... One tablet by mouth two times a day for infection  Patient Instructions: 1)  You have infection in your urine. 2)  It will be sent to the lab 3)  Start bactrim by mouth two times a day. 4)  Drink plenty of water 5)  symptoms should improve as the antibiotics get into your system 6)  Follow up as needed Prescriptions: BACTRIM 400-80 MG TABS (SULFAMETHOXAZOLE-TRIMETHOPRIM) One tablet by mouth two times a day for infection  #14 x 0   Entered and Authorized by:   Lehman Prom FNP    Signed by:   Lehman Prom FNP on 01/26/2010   Method used:   Print then Give to Patient   RxID:   [id number]   Laboratory Results   Urine Tests  Date/Time Received: January 26, 2010 5:28 PM   Routine Urinalysis   Color: DK YELLOW Appearance: Cloudy Glucose: negative   (Normal Range: Negative) Bilirubin: negative   (Normal Range: Negative) Ketone: negative   (Normal Range: Negative) Spec. Gravity: 1.015   (Normal Range: 1.003-1.035) Blood: moderate   (Normal Range: Negative) pH: 6.5   (Normal Range: 5.0-8.0) Protein: negative   (Normal Range: Negative) Urobilinogen: 0.2   (Normal Range: 0-1) Nitrite: negative   (Normal Range: Negative) Leukocyte Esterace: large   (Normal Range: Negative)      Wet Mount/KOH Source: vaginal  WBC/hpf: 1-5 Bacteria/hpf: 1+ Clue cells/hpf: none Yeast/hpf: none Trichomonas/hpf: none    Laboratory Results   Urine Tests    Routine Urinalysis   Color: DK YELLOW Appearance: Cloudy Glucose: negative   (Normal Range: Negative) Bilirubin: negative   (Normal Range: Negative) Ketone: negative   (Normal Range: Negative) Spec. Gravity: 1.015   (Normal Range: 1.003-1.035) Blood: moderate   (Normal Range: Negative) pH: 6.5   (Normal Range: 5.0-8.0) Protein: negative   (Normal Range: Negative) Urobilinogen: 0.2   (Normal Range: 0-1) Nitrite: negative   (Normal Range: Negative) Leukocyte Esterace: large   (Normal Range: Negative)      Wet Mount Wet Mount KOH: Negative

## 2010-09-19 NOTE — Progress Notes (Signed)
Summary: Lab Results  Phone Note Call from Patient   Summary of Call: the pt needs to receive her lab results.  Can someone call her back? [facility] FNP Initial call taken by: Manon Hilding,  March 23, 2010 3:43 PM  Follow-up for Phone Call        Sent to N. Daphine Deutscher.  Dutch Quint RN  March 23, 2010 3:49 PM   Additional Follow-up for Phone Call Additional follow up Details #1::        Urine sample not significant for infection  - only 30,000 and infection would be >100,000. No other meds needed however pt needs to drink plent of water. If she is having issues with bladder such as frequency and urgency then perhaps she will benefit from bladder spasm medication Additional Follow-up by: Lehman Prom FNP,  March 23, 2010 4:30 PM    Additional Follow-up for Phone Call Additional follow up Details #2::    States she is still having burning, frequency and urgency.  Would like to have the med for bladder spasms and anything else that would help. Follow-up by: Dutch Quint RN,  March 23, 2010 4:50 PM  Additional Follow-up for Phone Call Additional follow up Details #3:: Details for Additional Follow-up Action Taken: Rx printed and in basket - fax to pt's pharmacy [email or web address],fnp  March 23, 2010  5:03 PM  Rx faxed to pharmacy - pt. advised of Rx.  Dutch Quint RN  March 23, 2010 5:16 PM   New/Updated Medications: PHENAZOPYRIDINE HCL 200 MG TABS (PHENAZOPYRIDINE HCL) One tablet by mouth three times a day for bladder Prescriptions: PHENAZOPYRIDINE HCL 200 MG TABS (PHENAZOPYRIDINE HCL) One tablet by mouth three times a day for bladder  #15 x 0   Entered and Authorized by:   Lehman Prom FNP   Signed by:   Lehman Prom FNP on 03/23/2010   Method used:   Printed then faxed to ...         RxID:   [id number]

## 2010-09-21 NOTE — Progress Notes (Signed)
Summary: Query:  Refill hydroxyzine and naprosyn?  Phone Note Outgoing Call   Summary of Call: Last seen 02/2010.  Refill hydroxyzine and naprosyn per protocol?  Last refill was one fill, no refills for each. Initial call taken by: Dutch Quint RN,  September 08, 2010 4:54 PM  Follow-up for Phone Call        med refilled Follow-up by: Lehman Prom FNP,  September 08, 2010 5:26 PM

## 2010-09-27 ENCOUNTER — Telehealth (INDEPENDENT_AMBULATORY_CARE_PROVIDER_SITE_OTHER): Payer: Self-pay | Admitting: Nurse Practitioner

## 2010-10-05 NOTE — Progress Notes (Signed)
Summary: Query:  Refill lisinopril-HCTZ?  Phone Note Outgoing Call   Summary of Call: Last seen 02/2010.  Last refills of lisinopril-HCTZ not apparently done by this office -- Rx date 09/14/09 per pharmacy.  Last filled 07/11/10.  Refill per protocol? Initial call taken by: Dutch Quint RN,  September 27, 2010 5:53 PM  Follow-up for Phone Call        yes ok to refill but advise pt she needs to schedule an appt to see provider and come fasting for labs - labs not done since 2010 - need to check kidney, liver blood sugar, cholesterol especially with her being on bp meds Follow-up by: Lehman Prom FNP,  September 28, 2010 8:02 AM  Additional Follow-up for Phone Call Additional follow up Details #1::        Pt. advised of refill and need for f/u with provider and labs -- appt. scheduled 10/09/10.  Refill completed.  Dutch Quint RN  September 28, 2010 10:19 AM     Prescriptions: LISINOPRIL-HYDROCHLOROTHIAZIDE 20-25 MG  TABS (LISINOPRIL-HYDROCHLOROTHIAZIDE) 1 tablet by mouth daily for blood pressure  #30 x 3   Entered by:   Dutch Quint RN   Authorized by:   Lehman Prom FNP   Signed by:   Dutch Quint RN on 09/28/2010   Method used:   Electronically to        [facility] [phone]* (retail)       [address]       [place], [place]  [address]       Ph: [phone]       Fax: [phone]   RxID:   [id number]

## 2010-10-14 IMAGING — CR DG KNEE COMPLETE 4+V*R*
4 series · 4 of 4 positions shown · non-contrast
Comparison: None

CLINICAL DATA: Right knee pain and swelling

RIGHT KNEE - COMPLETE 4+ VIEW

[t knee ap right]
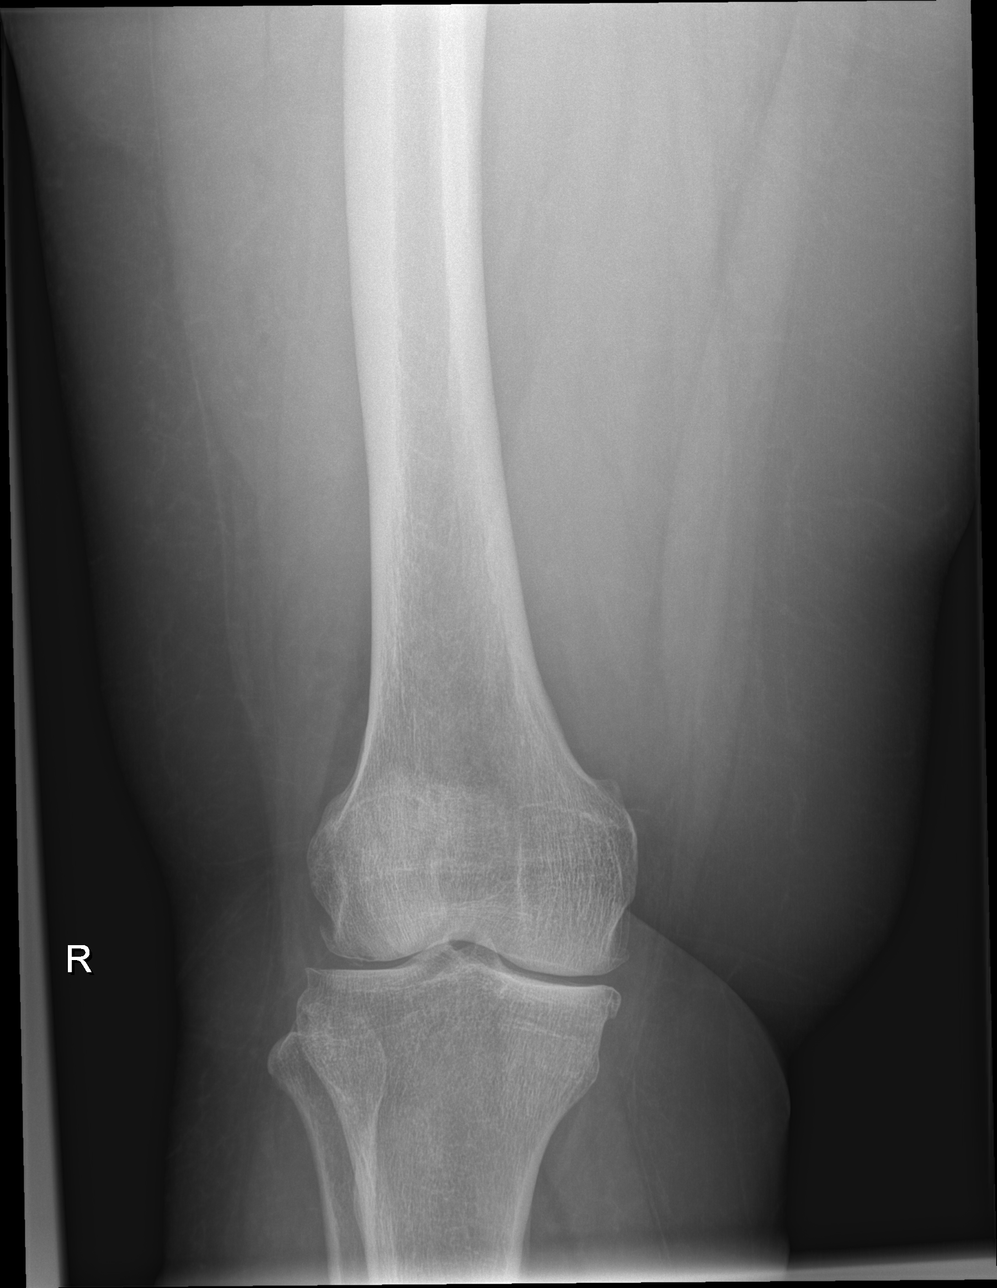

[t knee obl right (1 of 2)]
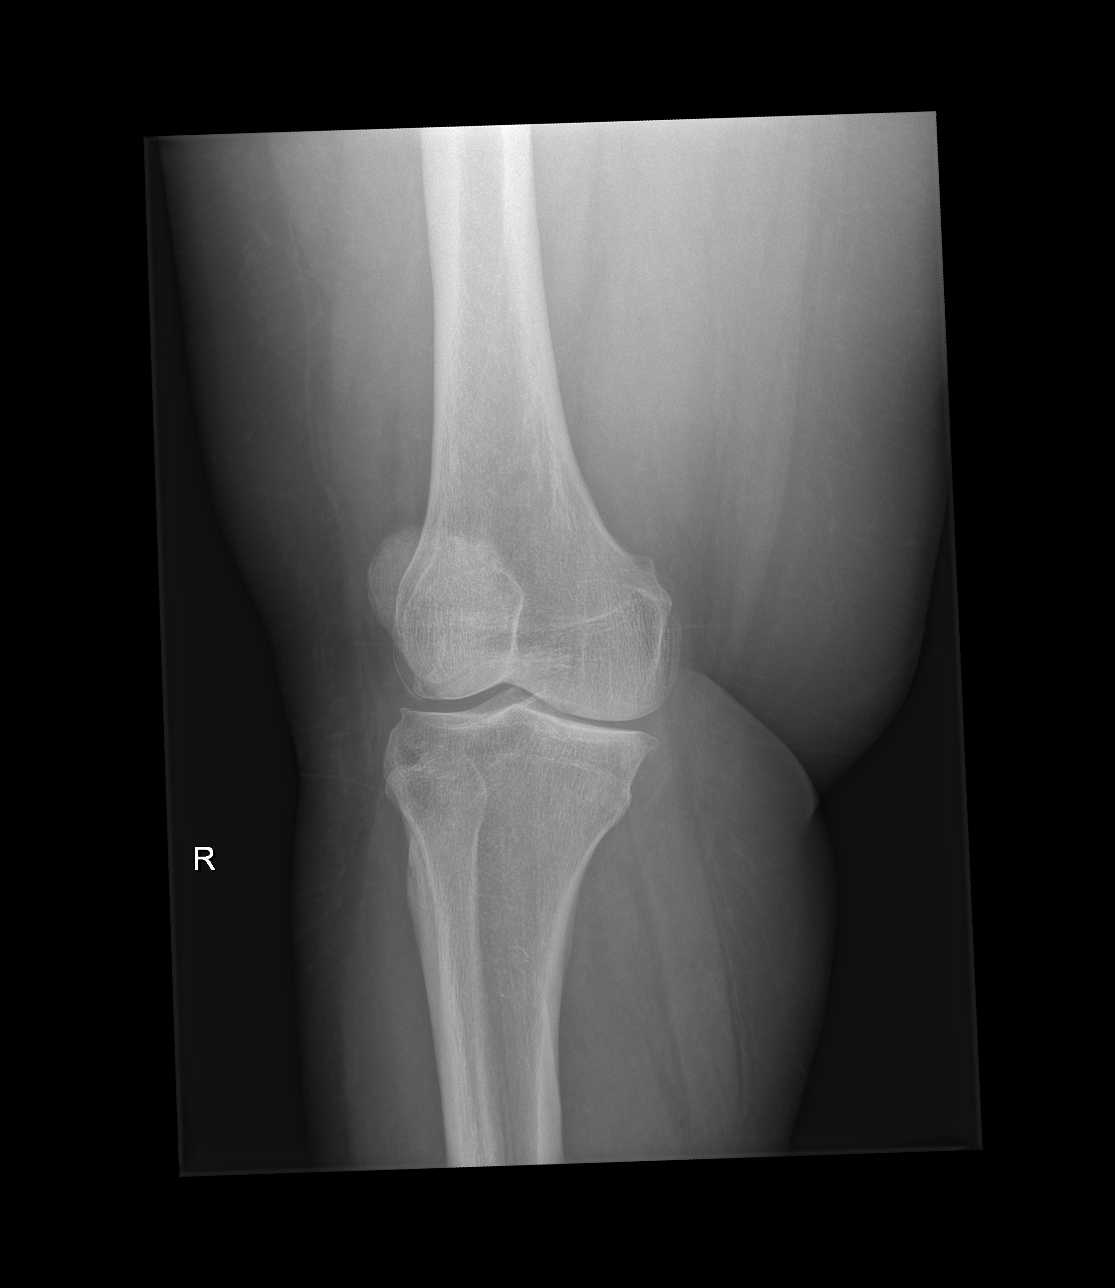

[t knee obl right (2 of 2)]
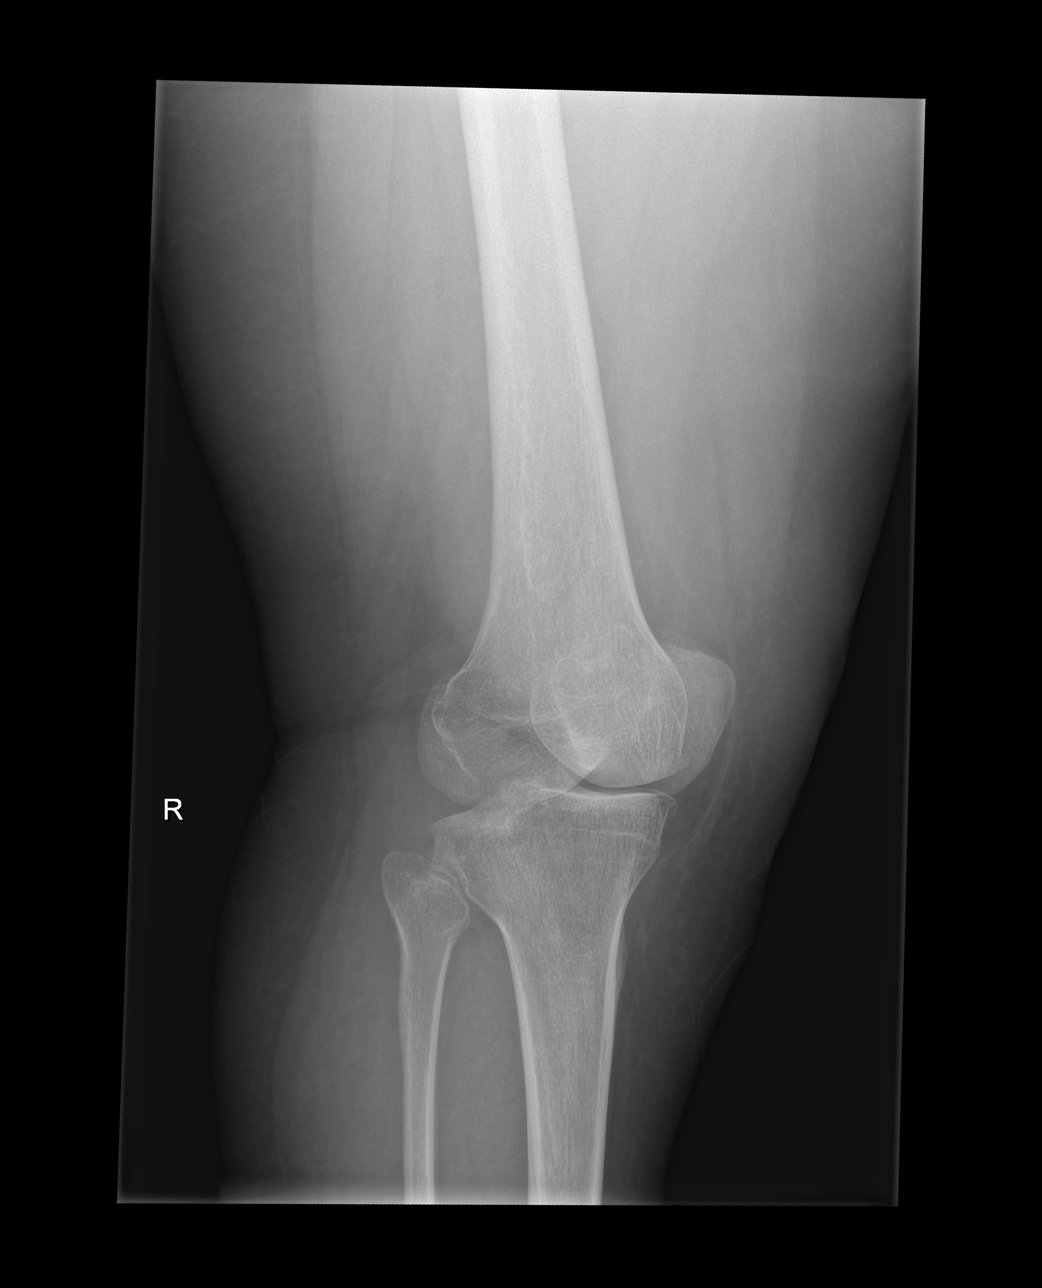

[t knee lat right]
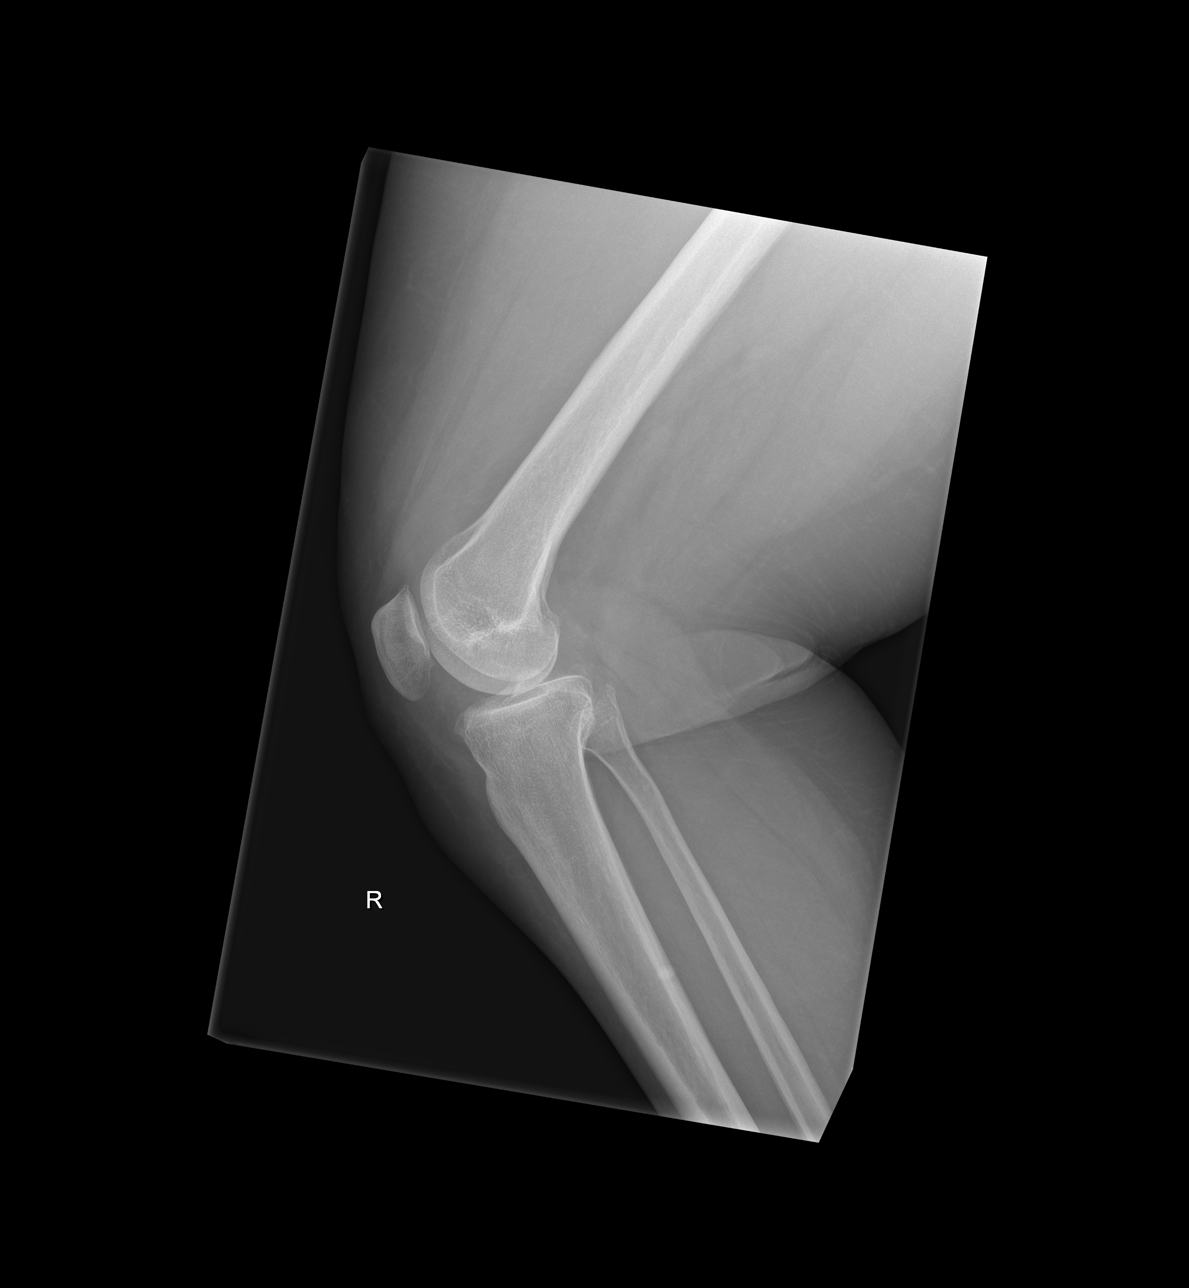

[4 of 4 positions shown; findings below may reference images not displayed]

FINDINGS: There is narrowing of the medial and lateral joint space.
Associated osteophytosis of the medial and lateral compartment.
There is small subpatellar joint effusion.  No evidence of fracture
or  dislocation.
IMPRESSION: Bicompartment osteoarthritis.  Small joint effusion.  No evidence
of trauma.

## 2011-01-05 NOTE — Op Note (Signed)
NAME:  Caroline Ryan, Caroline Ryan               ACCOUNT NO.:  [id number]   MEDICAL RECORD NO.:  [id number]          PATIENT TYPE:  INP   LOCATION:  9399                          FACILITY:  [facility]   PHYSICIAN:  Naima A. Dillard, M.D. DATE OF BIRTH:  February 12, 1966   DATE OF PROCEDURE:  09/18/2005  DATE OF DISCHARGE:                                 OPERATIVE REPORT   PREOPERATIVE DIAGNOSIS:  Symptomatic fibroids and menorrhagia.   POSTOPERATIVE DIAGNOSIS:  Symptomatic fibroids and menorrhagia.   OPERATION/PROCEDURE:  Abdominal myomectomy.   SURGEON:  Naima A. Normand Sloop, M.D.   ASSISTANT:  Osborn Coho, M.D.   ANESTHESIA:  General endotracheal tube.   SPECIMENS:  Uterine myoma.   ESTIMATED BLOOD LOSS:  100 mL.   COMPLICATIONS:  None.   DISPOSITION:  The patient went to PACU in stable condition.   DESCRIPTION OF PROCEDURE:  The patient was taken to the operating room and  she was given general anesthesia, placed in the dorsal supine position and  prepped and draped in the normal sterile fashion.  Foley catheter was  placed.  A Pfannenstiel skin incision was made with the scalpel and carried  down to the fascia using Bovie cautery.  The fascia was incised in the  midline, extended bilaterally using Mayo scissors.  Kochers x2 were placed  in the superior aspect and the fascia was abducted off of the rectus muscle  both sharply and bluntly.  The rectus muscles were separated in the midline.  The peritoneum was identified, tented up and entered sharply.  The bowel was  packed away with moist laparotomy sponges.  The O'Connor-O'Sullivan was  placed without difficulty making sure no injury was done to the bowel.  The  uterine incision was then made with the Bovie pinpoint cautery after 10 mL  of vasopressin mixture was used.  The 5 cm fibroid was both bluntly and  sharply dissected.  Three other small fibroids were both bluntly and sharply  dissected with entry into the endometrial cavity.  The  incision was  reapproximated using 0 Vicryl in pursestring fashion and figure-of-eight  stitches.  The serosa was reapproximated using 2-0 Vicryl in a running  locked fashion.  Interceed was placed on the incision.  It was noted to be  hemostatic.  Irrigation was done of the abdomen and everything was noted to  be hemostatic before the Interceed was placed.  All sponges were removed  from the abdomen.  The O'Connor-O'Sullivan was removed.  The peritoneum was  reapproximated using 0 chromic.  The fascia was approximated using 0 Vicryl  in running locked fashion.  Before the fascia was closed, the muscle was  inspected and noticed to be hemostatic.  The subcutaneous tissue was  reapproximated using 2-0 plain.  The skin was reapproximated using 3-0  Monocryl subcuticular fashion with some  Dermabond just right at the end of the right of the incision.  Before the  scalpel incision was made, approximately 10 mL of 0.5% Marcaine was used for  anesthesia.  Sponge, lap and needle counts were correct x2.  The patient  went to the  recovery room in stable condition.      Naima A. Normand Sloop, M.D.  Electronically Signed     NAD/MEDQ  D:  09/18/2005  T:  09/18/2005  Job:  [id number]

## 2011-01-05 NOTE — Discharge Summary (Signed)
NAMESWEETIE, GIEBLER                           ACCOUNT NO.:  [id number]   MEDICAL RECORD NO.:  [id number]                   PATIENT TYPE:  INP   LOCATION:  5041                                 FACILITY:  [facility]   PHYSICIAN:  Ara D. Metjian, M.D.                DATE OF BIRTH:  Nov 23, 1965   DATE OF ADMISSION:  08/09/2003  DATE OF DISCHARGE:  08/11/2003                                 DISCHARGE SUMMARY   PRIMARY CARE PHYSICIAN:  Unassigned, but patient states she will find a  primary care physician.   FINAL DIAGNOSES:  1. Right lower lobe pneumonia.  2. Asthma.  3. Reported history of hypertension.  4. Mild obesity.   PROCEDURES:  1. Two view of the chest performed August 09, 2003.  Impression:  Question     of right infrahilar mass versus infiltrate.  Consider further evaluation     with CT of the chest.  2. CT of the chest performed August 09, 2003.  Impression:  A 22 x 28 mm     right lower hilar mass with adjacent hilar adenopathy.  This could     represent a carcinoma or possibly a rounded pneumonia.  Follow-up CT is     suggested in two weeks to determine for interval change.  If the lesion     is stable then biopsy is suggested.   LABORATORY DATA:  On the day of discharge, white blood cells 12.4, decreased  from 25.4,  H&H 8.8/26.7 with platelet count of 180.  Sodium 140, potassium  3.8 increased from 3, chloride 111, CO2 24, BUN 9, creatinine 0.7, glucose  99, calcium 8.1.  Blood cultures no growth to this date.   HOSPITAL COURSE:  The patient is a very pleasant 45 year old African  American female who was admitted on August 09, 2003, with right upper  quadrant pain and pleuritic right chest pain.  The patient had the above  studies as mentioned which were worse and given her history and physical it  was felt to be more consistent with pneumonia.  The patient was treated with  IV ceftriaxone and azithromycin and with prompt relief of her symptoms,  especially with  IV fluids.  The patient was hypokalemic.  This was replaced  with p.o. supplementation and the patient continued to improve throughout  her hospital stay.  On the day prior to discharge she had been feeling well  enough to go home, however, she is kept for another day for observation and  on the day of discharge, she was eating full meals, ambulating around her  room on the floor and will be discharged home in good condition.   DISCHARGE MEDICATIONS:  1. Albuterol metered dose inhaler p.r.n. wheezing.  2. Cefixime 400 mg p.o. daily x6 days.  3. Azithromycin 500 mg p.o. daily x6 days.   DISCHARGE INSTRUCTIONS:  1. The patient is to take all her  medications as prescribed.  2. She is to follow up with a primary care physician in two to four weeks.  3. She is to have a repeat chest x-ray or CT scan in two to four weeks to     further evaluate the persistence of this lung mass.  4. She is to return if she feels worse.  5. She is to get copies of her medical records to bring with her to her new     primary care physician's office prior to her visit.                                                Ara D. Tammi Klippel, M.D.    ADM/MEDQ  D:  08/11/2003  T:  08/12/2003  Job:  [id number]

## 2011-01-05 NOTE — Discharge Summary (Signed)
NAME:  Caroline Ryan, Caroline Ryan               ACCOUNT NO.:  [id number]   MEDICAL RECORD NO.:  [id number]          PATIENT TYPE:  INP   LOCATION:  9308                          FACILITY:  [facility]   PHYSICIAN:  Naima A. Dillard, M.D. DATE OF BIRTH:  [birth year]   DATE OF ADMISSION:  09/18/2005  DATE OF DISCHARGE:  09/20/2005                                 DISCHARGE SUMMARY   DISCHARGE DIAGNOSIS:  Fibroid uterus, menorrhagia, adenomyoma.   OPERATION:  On the day of admission, the patient underwent an abdominal  myomectomy and tolerated the procedure well. The patient had 4 fibroids  removed, the largest of which measured 5 cm.   HISTORY OF PRESENT ILLNESS:  Caroline Ryan is a 45 year old African-American  female para 3-0-1-3 who presents for myomectomy because of symptomatic  uterine fibroids and menorrhagia. Please see the patient's dictated history  and physical examination for details.   PREOPERATIVE PHYSICAL EXAMINATION:  VITAL SIGNS:  Blood pressure 120/80,  weight 207 pounds.  GENERAL:  Within normal limits.  PELVIC:  Vaginal exam is within normal limits. Cervix is nontender without  any lesions. Uterus approximately 11 weeks size and irregular. Adnexa  without masses, without tenderness.   HOSPITAL COURSE:  On the day of admission, the patient underwent the  aforementioned procedure, tolerating it well.  Postoperative course was  unremarkable with the patient tolerating a postoperative hemoglobin of 8.5  (preop hemoglobin 9.9).  By postoperative day #2, the patient had resumed  bowel and bladder function and was, therefore, deemed ready for discharge  home.   DISCHARGE MEDICATIONS:  1.  Motrin 800 milligrams 1 tablet every 8 hours as needed for pain.  2.  Albuterol as directed.  3.  Percocet 1-2 tablets every 4-6 hours as needed for pain.  4.  Hydrochlorothiazide as directed.  5.  Colace 1 tablet twice daily as needed.  6.  Phenergan 1 tablet every 12 hours as needed.  7.  Iron 325  milligrams 1 tablet twice daily for 6 weeks.   FOLLOW UP:  1.  The patient is scheduled for a 6 week postoperative visit with Dr.      Normand Sloop.  She is to call [facility] at [phone] to receive      the date and time of an appointment.  2.  The patient also was referred to Dr. Hazle Quant for evaluation of a      subconjunctival hemorrhage which emerged postoperatively.  She is to      call his office at [phone].   DISCHARGE INSTRUCTIONS:  The patient was given a copy of the [address]  [facility] postoperative instruction sheet.  She was further advised to  avoid driving for 2 weeks, heavy lifting for 4 weeks, intercourse for 6  weeks.  She may shower, she may walk up steps.  Her diet was without  restriction.   FINAL PATHOLOGY:  Uterus, myomectomy:  Leiomyomas, adenomyoma.      [place] J. Adline Peals.      Naima A. Normand Sloop, M.D.  Electronically Signed    EJP/MEDQ  D:  10/03/2005  T:  10/03/2005  Job:  [id number]

## 2011-01-05 NOTE — H&P (Signed)
NAME:  Caroline Ryan, Caroline Ryan               ACCOUNT NO.:  [id number]   MEDICAL RECORD NO.:  [id number]          PATIENT TYPE:  INP   LOCATION:  NA                            FACILITY:  [facility]   PHYSICIAN:  Naima A. Dillard, M.D. DATE OF BIRTH:  05/09/1966   DATE OF ADMISSION:  DATE OF DISCHARGE:                                HISTORY & PHYSICAL   DIAGNOSES:  Menorrhagia and uterine fibroids.   HISTORY AND PHYSICAL:  The patient is a 45 year old African-American female,  gravida 4, para 3, who presented complaining of heavy vaginal bleeding.  She  says she changes Poise pads 2-3 times a day x3 days, and then twice a day.  She says her cramping was 6/10, and did not take any medication for them.  She denied having any missed periods.  This had been going on for a year and  a half.  At her primary care's office in November 2006, her hemoglobin was  8.3.  She is not on any contraception.  Ultrasound was significant for 5.1 x  5.1 x 5.0 fibroid.  Bilateral ovaries were normal.  Uterus altogether was  11.7 x 7.6 x 8.9 cm.  The patient is on no new medications.  Does not have  any menopausal symptoms or vaginal discharge.  No abdominal pain or  increased stress.  The patient had an endometrial biopsy, which was found to  be benign, showing secretory endometrium.   PAST MEDICAL HISTORY:  1.  Anemia.  Her last hemoglobin was 13.9 after taking iron.  2.  Asthma, with no history of intubation.  3.  Hypertension.   PAST SURGICAL HISTORY:  Removal of breast cyst.   PAST OBSTETRICAL HISTORY:  Significant for a vaginal delivery x3 without any  complications and one elective abortion without any complications.   SOCIAL HISTORY:  Negative for alcohol, tobacco, and illicit drug use.   REVIEW OF SYSTEMS:  CARDIOVASCULAR:  Significant for hypertension.  GI:  Unremarkable.  GENITOURINARY:  As above.  MUSCULOSKELETAL:  Unremarkable.  PSYCHIATRIC:  Unremarkable.   PHYSICAL EXAMINATION:  VITAL SIGNS:  The  patient's blood pressure is 120/80.  Weight is 207 pounds.  HEENT:  Pupils are equal.  Nose and throat are clear.  Thyroid is not  enlarged.  HEART:  Regular rate and rhythm.  CHEST:  Clear to auscultation bilaterally.  BREASTS:  No masses, skin changes, nipple retraction or discharge  bilaterally.  BACK:  No CVA tenderness bilaterally.  ABDOMEN:  Nontender without any masses or organomegaly.  EXTREMITIES:  No clubbing, cyanosis, or edema.  NEUROLOGIC:  Within normal limits.  PELVIC:  Vaginal exam is within normal limits.  Cervix is nontender without  any lesions.  Uterus about 11 weeks size, irregular.  Adnexa with no masses.  Nontender.  Urine pregnancy is negative.  Ultrasound as above.   ASSESSMENT:  Menorrhagia and fibroids.   PLAN:  The patient was told of all treatments for fibroids and menorrhagia  including, but not limited to, observation, hormonal treatment, D&C,  NovaSure ablation, Lupron, myomectomy, hysterectomy reviewed in detail.  The  patient has chosen  myomectomy.  She understands the risks are, but not  limited to, bleeding, infection, damage to internal organs such as bowel,  bladder and major blood vessels, and probable anesthesia.      Naima A. Normand Sloop, M.D.  Electronically Signed     NAD/MEDQ  D:  09/17/2005  T:  09/17/2005  Job:  [id number]

## 2011-01-05 NOTE — H&P (Signed)
NAMELORALYE, LOBERG NO.:  [id number]   MEDICAL RECORD NO.:  [id number]                   PATIENT TYPE:  INP   LOCATION:  5041                                 FACILITY:  [facility]   PHYSICIAN:  Vania Rea, M.D.              DATE OF BIRTH:  1966/05/31   DATE OF ADMISSION:  08/09/2003  DATE OF DISCHARGE:                                HISTORY & PHYSICAL   PRIMARY CARE PHYSICIAN:  Unassigned   CHIEF COMPLAINT:  Back, leg, and chest pain since today.   HISTORY OF PRESENT ILLNESS:  This is a 45 year old African-American lady  with a history of mild asthma.  Last attack was last winter.  Last  hospitalization many years ago.  Has been having fever, chills associated  with cough productive of green sputum for the past two to three days.  The  patient has been self medicating with over-the-counter flu medications  without much success.  This morning the patient started having pains in the  chest, upper back, and legs, worse with breathing, but still went to work.  However, at work, the pain became intolerable and she was brought to the  emergency room.  In the emergency room chest x-ray revealed an abnormality.  The patient denies hemoptysis, weight loss.  She denies nausea or vomiting.  She denies diarrhea or constipation.  She denies change in bowel habits.  She denies anorexia.  She denies any symptoms of dyspnea on exertion or  shortness of breath, but does have occasional swelling of her legs from  prolonged standing.  She is a Agricultural engineer.   PAST MEDICAL HISTORY:  1. Asthma.  2. Hypertension.   MEDICATIONS:  1. Proventil inhaler when necessary.  2. She takes medications inconsistent for hypertension.   ALLERGIES:  No known drug allergies.   SOCIAL HISTORY:  She denies tobacco, alcohol, or drug use.  She is a Probation officer.  She is single with three children ages 12-24.   FAMILY HISTORY:  She has eight siblings.  One has died of  complications of  diabetes.  Another died of complications from alcohol abuse.  One of her  living siblings has diabetes.  She does not know the health of the others.   REVIEW OF SYSTEMS:  Contributes nothing further.   PHYSICAL EXAMINATION:  GENERAL:  This is an obese young looking African-  American lady lying in the stretcher distressed by pain.  VITAL SIGNS:  Temperature 98.6, T-max 102, blood pressure while in the  emergency room varied between 114/67-149/74, pulse ranged between 89,  currently 131 when she came in.  She is saturating at 96% on room air.  HEENT:  She is pink and anicteric.  PERL.  No lymphadenopathy.  CHEST:  She is tender in the left anterior chest and the physical  examination is limited because of pain with breathing, but appears to be  clear to  auscultation.  There is no wheezing.  CARDIOVASCULAR:  She is tachycardic.  No murmur.  ABDOMEN:  Obese, soft.  She is tender in the right upper quadrant.  No  masses are felt.  EXTREMITIES:  She has 1+ edema bilaterally, dorsalis pedis ____________.  CNS:  There are no focal deficits.   LABORATORIES:  No laboratory work has been done.  Chest x-ray and the CT  show a mass consistent with a pneumonia but because of the shape we are  advised to rule out a tumor.   ASSESSMENT:  Clinically compatible with an acute pneumonia but the CT  findings suggest we need to review this at a later date to see if there is  an underlying mass.  Abdominal examination is suggestive of gallbladder  disease in this obese young lady.   PLAN:  Will give her IV fluids and antibiotics.  Will get some laboratory  work done including cultures and look at her chemistry.  Will do an  ultrasound of gallbladder area.  Will do a follow-up chest x-ray in a few  days and depending on her response she may need bronchoscopy or repeat CT.                                                Vania Rea, M.D.    LC/MEDQ  D:  08/10/2003  T:   08/10/2003  Job:  [id number]

## 2011-04-21 ENCOUNTER — Emergency Department (HOSPITAL_COMMUNITY)
Admission: EM | Admit: 2011-04-21 | Discharge: 2011-04-21 | Disposition: A | Discharge status: Home / Self Care | Payer: Medicaid Other | Attending: Emergency Medicine | Admitting: Emergency Medicine

## 2011-04-21 ENCOUNTER — Emergency Department (HOSPITAL_COMMUNITY): Payer: Medicaid Other

## 2011-04-21 DIAGNOSIS — I10 Essential (primary) hypertension: Secondary | ICD-10-CM | POA: Insufficient documentation

## 2011-04-21 DIAGNOSIS — M25469 Effusion, unspecified knee: Secondary | ICD-10-CM | POA: Insufficient documentation

## 2011-04-21 DIAGNOSIS — M25569 Pain in unspecified knee: Secondary | ICD-10-CM | POA: Insufficient documentation

## 2011-04-21 DIAGNOSIS — M1710 Unilateral primary osteoarthritis, unspecified knee: Secondary | ICD-10-CM | POA: Insufficient documentation

## 2011-04-21 DIAGNOSIS — IMO0002 Reserved for concepts with insufficient information to code with codable children: Secondary | ICD-10-CM | POA: Insufficient documentation

## 2011-04-21 DIAGNOSIS — J45909 Unspecified asthma, uncomplicated: Secondary | ICD-10-CM | POA: Insufficient documentation

## 2012-03-17 ENCOUNTER — Inpatient Hospital Stay (HOSPITAL_COMMUNITY)
Admission: AD | Admit: 2012-03-17 | Discharge: 2012-03-17 | Disposition: A | Discharge status: Home / Self Care | Payer: Medicaid Other | Source: Ambulatory Visit | Attending: Family Medicine | Admitting: Family Medicine

## 2012-03-17 ENCOUNTER — Encounter (HOSPITAL_COMMUNITY): Payer: Self-pay | Admitting: *Deleted

## 2012-03-17 DIAGNOSIS — R109 Unspecified abdominal pain: Secondary | ICD-10-CM | POA: Insufficient documentation

## 2012-03-17 DIAGNOSIS — B3731 Acute candidiasis of vulva and vagina: Secondary | ICD-10-CM | POA: Insufficient documentation

## 2012-03-17 DIAGNOSIS — N949 Unspecified condition associated with female genital organs and menstrual cycle: Secondary | ICD-10-CM | POA: Insufficient documentation

## 2012-03-17 DIAGNOSIS — B379 Candidiasis, unspecified: Secondary | ICD-10-CM

## 2012-03-17 DIAGNOSIS — B373 Candidiasis of vulva and vagina: Secondary | ICD-10-CM | POA: Insufficient documentation

## 2012-03-17 DIAGNOSIS — N390 Urinary tract infection, site not specified: Secondary | ICD-10-CM

## 2012-03-17 HISTORY — DX: Headache: R51

## 2012-03-17 HISTORY — DX: Benign neoplasm of connective and other soft tissue, unspecified: D21.9

## 2012-03-17 HISTORY — DX: Unspecified asthma, uncomplicated: J45.909

## 2012-03-17 LAB — WET PREP, GENITAL
Trich, Wet Prep: NONE SEEN
Yeast Wet Prep HPF POC: NONE SEEN

## 2012-03-17 LAB — URINALYSIS, ROUTINE W REFLEX MICROSCOPIC
Bilirubin Urine: NEGATIVE
Glucose, UA: NEGATIVE mg/dL
Ketones, ur: NEGATIVE mg/dL
Nitrite: NEGATIVE
Protein, ur: NEGATIVE mg/dL
Specific Gravity, Urine: >1.03 — ABNORMAL HIGH (ref 1.005–1.030)
Urobilinogen, UA: 0.2 mg/dL (ref 0.0–1.0)
pH: 5.5 (ref 5.0–8.0)

## 2012-03-17 LAB — URINE MICROSCOPIC-ADD ON

## 2012-03-17 MED ORDER — PHENAZOPYRIDINE HCL 100 MG PO TABS: 100.0000 mg | ORAL_TABLET | Freq: Three times a day (TID) | ORAL | Status: AC | PRN

## 2012-03-17 MED ORDER — CIPROFLOXACIN HCL 500 MG PO TABS: 500.0000 mg | ORAL_TABLET | Freq: Two times a day (BID) | ORAL | Status: AC

## 2012-03-17 MED ORDER — FLUCONAZOLE 150 MG PO TABS
150.0000 mg | ORAL_TABLET | Freq: Once | ORAL | Status: AC
  Administered 2012-03-17: 150 mg via ORAL
  Filled 2012-03-17: qty 1

## 2012-03-17 NOTE — MAU Provider Note (Signed)
History     CSN: [id number]  Arrival date and time: 03/17/12 1257   First Provider Initiated Contact with Patient 03/17/12 1703      Chief Complaint  Patient presents with  . Abdominal Pain   HPI Caroline Ryan is a 46 y.o. female who presents to MAU for pain in lower abdomen. The symptoms started 4 days ago. Associated symptoms include frequent urination, urgency and burning. Also has vaginal discharge that is white and vaginal itching. LMP 03/13/12. Current sex partner x 1 month, unprotected sex. The history was provided by the patient.  OB History    Grav Para Term Preterm Abortions TAB SAB Ect Mult Living   3 3 3  0 0 0 0 0 0 3      Past Medical History  Diagnosis Date  . Headache   . Infection   . Asthma   . Urinary tract infection   . Fibroid     Past Surgical History  Procedure Date  . Breast surgery     bilateral cysts/benign    Family History  Problem Relation Age of Onset  . Other Neg Hx     History  Substance Use Topics  . Smoking status: Former Games developer  . Smokeless tobacco: Not on file   Comment: as teenager  . Alcohol Use: No    Allergies: No Known Allergies  Prescriptions prior to admission  Medication Sig Dispense Refill  . albuterol (PROVENTIL HFA;VENTOLIN HFA) 108 (90 BASE) MCG/ACT inhaler Inhale 2 puffs into the lungs every 6 (six) hours as needed. Asthma attacks      . ibuprofen (ADVIL,MOTRIN) 200 MG tablet Take 400 mg by mouth every 6 (six) hours as needed. Head ache        Review of Systems  Constitutional: Negative for fever, chills and weight loss.  HENT: Negative for ear pain, nosebleeds, congestion, sore throat and neck pain.   Eyes: Negative for blurred vision, double vision, photophobia and pain.  Respiratory: Negative for cough, shortness of breath and wheezing.   Cardiovascular: Negative for chest pain, palpitations and leg swelling.  Gastrointestinal: Positive for abdominal pain. Negative for heartburn, nausea, vomiting,  diarrhea and constipation.  Genitourinary: Positive for dysuria, urgency and frequency.       Vaginal discharge, vaginal itching  Musculoskeletal: Negative for myalgias and back pain.  Skin: Negative for itching and rash.  Neurological: Negative for dizziness, sensory change, speech change, seizures, weakness and headaches.  Endo/Heme/Allergies: Does not bruise/bleed easily.  Psychiatric/Behavioral: Negative for depression. The patient is not nervous/anxious.    Physical Exam   Blood pressure 172/85, pulse 75, temperature 98 F (36.7 C), temperature source Oral, resp. rate 20, height 5' (1.524 m), weight 207 lb (93.895 kg), last menstrual period 03/08/2012.  Physical Exam  Nursing note and vitals reviewed. Constitutional: She is oriented to person, place, and time. She appears well-developed and well-nourished. No distress.  HENT:  Head: Normocephalic and atraumatic.  Eyes: EOM are normal.  Neck: Neck supple.  Cardiovascular: Normal rate.   Respiratory: Effort normal.  GI: Soft. There is tenderness in the suprapubic area. There is no rigidity, no rebound, no guarding and no CVA tenderness.  Genitourinary:       External genitalia without lesions. Thick white blood stained discharge vaginal vault. Positive CMT, minimal bilateral adnexal tenderness. Unable to palpate uterus due to patient habitus.  Musculoskeletal: Normal range of motion.  Neurological: She is alert and oriented to person, place, and time.  Skin: Skin is warm and  dry.  Psychiatric: She has a normal mood and affect. Her behavior is normal. Judgment and thought content normal.   Results for orders placed during the hospital encounter of 03/17/12 (from the past 24 hour(s))  URINALYSIS, ROUTINE W REFLEX MICROSCOPIC     Status: Abnormal   Collection Time   03/17/12  1:51 PM      Component Value Range   Color, Urine YELLOW  YELLOW   APPearance HAZY (*) CLEAR   Specific Gravity, Urine >1.030 (*) 1.005 - 1.030   pH 5.5   5.0 - 8.0   Glucose, UA NEGATIVE  NEGATIVE mg/dL   Hgb urine dipstick SMALL (*) NEGATIVE   Bilirubin Urine NEGATIVE  NEGATIVE   Ketones, ur NEGATIVE  NEGATIVE mg/dL   Protein, ur NEGATIVE  NEGATIVE mg/dL   Urobilinogen, UA 0.2  0.0 - 1.0 mg/dL   Nitrite NEGATIVE  NEGATIVE   Leukocytes, UA MODERATE (*) NEGATIVE  URINE MICROSCOPIC-ADD ON     Status: Abnormal   Collection Time   03/17/12  1:51 PM      Component Value Range   Squamous Epithelial / LPF FEW (*) RARE   WBC, UA 3-6  <3 WBC/hpf   RBC / HPF 7-10  <3 RBC/hpf   Bacteria, UA MANY (*) RARE   Urine-Other MUCOUS PRESENT     Assessment: Vaginal discharge   UTI   Monilia vaginosis  Plan:  Cipro Rx   Pyridium Rx   Diflucan 150 mg po now Procedures  Chandra Asher, RN, FNP, BC 03/17/2012, 5:08 PM

## 2012-03-17 NOTE — MAU Note (Signed)
Lower abd pain, started on Thursday.  "don't know if it is a urinary infection or what".  Pain up in uterus. Was at urgent care yesterday - they were busy was told to come back later or go to the ER.  Pain with urination. Period just went off last wk, but has noted some bleeding.

## 2012-03-18 LAB — GC/CHLAMYDIA PROBE AMP, GENITAL
Chlamydia, DNA Probe: NEGATIVE
GC Probe Amp, Genital: NEGATIVE

## 2012-03-18 NOTE — MAU Provider Note (Signed)
Chart reviewed and agree with management and plan.  

## 2013-01-22 ENCOUNTER — Emergency Department (INDEPENDENT_AMBULATORY_CARE_PROVIDER_SITE_OTHER)
Admission: EM | Admit: 2013-01-22 | Discharge: 2013-01-22 | Disposition: A | Discharge status: Home / Self Care | Payer: Self-pay | Source: Home / Self Care | Attending: Family Medicine | Admitting: Family Medicine

## 2013-01-22 ENCOUNTER — Encounter (HOSPITAL_COMMUNITY): Payer: Self-pay | Admitting: *Deleted

## 2013-01-22 DIAGNOSIS — T148 Other injury of unspecified body region: Secondary | ICD-10-CM

## 2013-01-22 DIAGNOSIS — W57XXXA Bitten or stung by nonvenomous insect and other nonvenomous arthropods, initial encounter: Secondary | ICD-10-CM

## 2013-01-22 DIAGNOSIS — L259 Unspecified contact dermatitis, unspecified cause: Secondary | ICD-10-CM

## 2013-01-22 MED ORDER — METHYLPREDNISOLONE SODIUM SUCC 125 MG IJ SOLR
125.0000 mg | Freq: Once | INTRAMUSCULAR | Status: AC
  Administered 2013-01-22: 125 mg via INTRAMUSCULAR

## 2013-01-22 MED ORDER — PREDNISONE 10 MG PO KIT: PACK | ORAL | Status: DC

## 2013-01-22 MED ORDER — METHYLPREDNISOLONE SODIUM SUCC 125 MG IJ SOLR
INTRAMUSCULAR | Status: AC
  Filled 2013-01-22: qty 2

## 2013-01-22 MED ORDER — HYDROXYZINE HCL 25 MG PO TABS: 25.0000 mg | ORAL_TABLET | Freq: Four times a day (QID) | ORAL | Status: DC | PRN

## 2013-01-22 MED ORDER — TRIAMCINOLONE ACETONIDE 0.5 % EX CREA: TOPICAL_CREAM | Freq: Three times a day (TID) | CUTANEOUS | Status: DC | PRN

## 2013-01-22 NOTE — ED Notes (Signed)
Has itchy /painful lesions - generalized over arms, legs. Left shoulder. Benadryl without relief - onset 2 days ago.

## 2013-01-22 NOTE — ED Provider Notes (Signed)
Medical screening examination/treatment/procedure(s) were performed by non-physician practitioner and as supervising physician I was immediately available for consultation/collaboration.   [facility]; MD  Sharin Grave, MD 01/22/13 [phone]

## 2013-01-22 NOTE — ED Provider Notes (Signed)
History     CSN: [id number]  Arrival date & time 01/22/13  1230   First MD Initiated Contact with Patient 01/22/13 1351      Chief Complaint  Patient presents with  . Insect Bite    (Consider location/radiation/quality/duration/timing/severity/associated sxs/prior treatment) HPI Comments: Pt presents c/o painful, itchy lesions on her arms and legs for 2 days.  The areas are red, warm, and she seems to be getting more and more of them.  She denies any know exposure to bug bites or irritants.  Denies fever, chills, joint pain, or Hx of similar rash.     Past Medical History  Diagnosis Date  . Headache(784.0)   . Infection   . Asthma   . Urinary tract infection   . Fibroid     Past Surgical History  Procedure Laterality Date  . Breast surgery      bilateral cysts/benign    Family History  Problem Relation Age of Onset  . Other Neg Hx     History  Substance Use Topics  . Smoking status: Former Games developer  . Smokeless tobacco: Not on file     Comment: as teenager  . Alcohol Use: No    OB History   Grav Para Term Preterm Abortions TAB SAB Ect Mult Living   3 3 3  0 0 0 0 0 0 3      Review of Systems  Constitutional: Negative for fever and chills.  Eyes: Negative for visual disturbance.  Respiratory: Negative for cough and shortness of breath.   Cardiovascular: Negative for chest pain, palpitations and leg swelling.  Gastrointestinal: Negative for nausea, vomiting and abdominal pain.  Endocrine: Negative for polydipsia and polyuria.  Genitourinary: Negative for dysuria, urgency and frequency.  Musculoskeletal: Negative for myalgias and arthralgias.  Skin: Positive for rash (see HPI).  Neurological: Negative for dizziness, weakness and light-headedness.    Allergies  Review of patient's allergies indicates no known allergies.  Home Medications   Current Outpatient Rx  Name  Route  Sig  Dispense  Refill  . albuterol (PROVENTIL HFA;VENTOLIN HFA) 108 (90 BASE)  MCG/ACT inhaler   Inhalation   Inhale 2 puffs into the lungs every 6 (six) hours as needed. Asthma attacks         . hydrOXYzine (ATARAX/VISTARIL) 25 MG tablet   Oral   Take 1 tablet (25 mg total) by mouth every 6 (six) hours as needed for itching.   30 tablet   0   . ibuprofen (ADVIL,MOTRIN) 200 MG tablet   Oral   Take 400 mg by mouth every 6 (six) hours as needed. Head ache         . PredniSONE 10 MG KIT      12 day taper dose pack, use as directed `   1 kit   0   . triamcinolone cream (KENALOG) 0.5 %   Topical   Apply topically 3 (three) times daily as needed.   30 g   0     BP 146/87  Pulse 83  Temp(Src) 98.4 F (36.9 C) (Oral)  Resp 18  SpO2 100%  LMP 01/17/2013  Physical Exam  Nursing note and vitals reviewed. Constitutional: She is oriented to person, place, and time. Vital signs are normal. She appears well-developed and well-nourished. No distress.  HENT:  Head: Atraumatic.  Eyes: EOM are normal. Pupils are equal, round, and reactive to light.  Cardiovascular: Normal rate, regular rhythm and normal heart sounds.  Exam reveals no gallop  and no friction rub.   No murmur heard. Pulmonary/Chest: Effort normal and breath sounds normal. No respiratory distress. She has no wheezes. She has no rales.  Abdominal: Soft. There is no tenderness.  Neurological: She is alert and oriented to person, place, and time. She has normal strength.  Skin: Skin is warm and dry. Rash noted. Rash is macular (erythematous, slightly raised, circular discrete red lesions spread about the exposed areas of the arms, legs, and shoulders. ). She is not diaphoretic.  Psychiatric: She has a normal mood and affect. Her behavior is normal. Judgment normal.    ED Course  Procedures (including critical care time)  Labs Reviewed - No data to display No results found.   1. Contact dermatitis   2. Bug bite       MDM  Given the distribution of this rash, it appears to be some sort  of bug bite that is causing it.  Will treat her for that with oral and topical corticosteroids and PO antihistamine.  If these return again she will initiate further workup by following up with her PCP.  She will return here or to PCP if intervention not effective.     Meds ordered this encounter  Medications  . methylPREDNISolone sodium succinate (SOLU-MEDROL) 125 mg/2 mL injection 125 mg    Sig:   . PredniSONE 10 MG KIT    Sig: 12 day taper dose pack, use as directed `    Dispense:  1 kit    Refill:  0  . hydrOXYzine (ATARAX/VISTARIL) 25 MG tablet    Sig: Take 1 tablet (25 mg total) by mouth every 6 (six) hours as needed for itching.    Dispense:  30 tablet    Refill:  0  . triamcinolone cream (KENALOG) 0.5 %    Sig: Apply topically 3 (three) times daily as needed.    Dispense:  30 g    Refill:  0           Graylon Good, PA-C 01/22/13 1424

## 2013-07-31 ENCOUNTER — Encounter (HOSPITAL_COMMUNITY): Payer: Self-pay | Admitting: Emergency Medicine

## 2013-07-31 ENCOUNTER — Emergency Department (HOSPITAL_COMMUNITY)
Admission: EM | Admit: 2013-07-31 | Discharge: 2013-07-31 | Disposition: A | Discharge status: Home / Self Care | Payer: Medicaid Other | Attending: Emergency Medicine | Admitting: Emergency Medicine

## 2013-07-31 DIAGNOSIS — J45909 Unspecified asthma, uncomplicated: Secondary | ICD-10-CM | POA: Insufficient documentation

## 2013-07-31 DIAGNOSIS — S298XXA Other specified injuries of thorax, initial encounter: Secondary | ICD-10-CM | POA: Insufficient documentation

## 2013-07-31 DIAGNOSIS — S4980XA Other specified injuries of shoulder and upper arm, unspecified arm, initial encounter: Secondary | ICD-10-CM | POA: Insufficient documentation

## 2013-07-31 DIAGNOSIS — Z8619 Personal history of other infectious and parasitic diseases: Secondary | ICD-10-CM | POA: Insufficient documentation

## 2013-07-31 DIAGNOSIS — Z87440 Personal history of urinary (tract) infections: Secondary | ICD-10-CM | POA: Insufficient documentation

## 2013-07-31 DIAGNOSIS — S46909A Unspecified injury of unspecified muscle, fascia and tendon at shoulder and upper arm level, unspecified arm, initial encounter: Secondary | ICD-10-CM | POA: Insufficient documentation

## 2013-07-31 DIAGNOSIS — Z87891 Personal history of nicotine dependence: Secondary | ICD-10-CM | POA: Insufficient documentation

## 2013-07-31 DIAGNOSIS — Z79899 Other long term (current) drug therapy: Secondary | ICD-10-CM | POA: Insufficient documentation

## 2013-07-31 DIAGNOSIS — R000 Tachycardia, unspecified: Secondary | ICD-10-CM | POA: Insufficient documentation

## 2013-07-31 DIAGNOSIS — Y92410 Unspecified street and highway as the place of occurrence of the external cause: Secondary | ICD-10-CM | POA: Insufficient documentation

## 2013-07-31 DIAGNOSIS — Y9389 Activity, other specified: Secondary | ICD-10-CM | POA: Insufficient documentation

## 2013-07-31 HISTORY — DX: Essential (primary) hypertension: I10

## 2013-07-31 MED ORDER — IBUPROFEN 800 MG PO TABS: 800.0000 mg | ORAL_TABLET | Freq: Three times a day (TID) | ORAL | Status: DC

## 2013-07-31 MED ORDER — LISINOPRIL-HYDROCHLOROTHIAZIDE 20-25 MG PO TABS: 1.0000 | ORAL_TABLET | Freq: Every day | ORAL | Status: DC

## 2013-07-31 MED ORDER — METHOCARBAMOL 500 MG PO TABS: 500.0000 mg | ORAL_TABLET | Freq: Two times a day (BID) | ORAL | Status: DC
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
## 2013-07-31 NOTE — ED Provider Notes (Signed)
Medical screening examination/treatment/procedure(s) were performed by non-physician practitioner and as supervising physician I was immediately available for consultation/collaboration.  EKG Interpretation   None         Enid Skeens, MD 07/31/13 2043
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
## 2014-05-08 ENCOUNTER — Encounter (HOSPITAL_COMMUNITY): Payer: Self-pay | Admitting: Emergency Medicine

## 2014-05-08 ENCOUNTER — Emergency Department (HOSPITAL_COMMUNITY)
Admission: EM | Admit: 2014-05-08 | Discharge: 2014-05-08 | Disposition: A | Discharge status: Home / Self Care | Payer: Medicaid Other | Attending: Emergency Medicine | Admitting: Emergency Medicine

## 2014-05-08 ENCOUNTER — Emergency Department (HOSPITAL_COMMUNITY): Payer: Medicaid Other

## 2014-05-08 DIAGNOSIS — Z87440 Personal history of urinary (tract) infections: Secondary | ICD-10-CM | POA: Insufficient documentation

## 2014-05-08 DIAGNOSIS — R42 Dizziness and giddiness: Secondary | ICD-10-CM | POA: Insufficient documentation

## 2014-05-08 DIAGNOSIS — J069 Acute upper respiratory infection, unspecified: Secondary | ICD-10-CM | POA: Insufficient documentation

## 2014-05-08 DIAGNOSIS — Z79899 Other long term (current) drug therapy: Secondary | ICD-10-CM | POA: Insufficient documentation

## 2014-05-08 DIAGNOSIS — Z791 Long term (current) use of non-steroidal anti-inflammatories (NSAID): Secondary | ICD-10-CM | POA: Insufficient documentation

## 2014-05-08 DIAGNOSIS — Z87448 Personal history of other diseases of urinary system: Secondary | ICD-10-CM | POA: Insufficient documentation

## 2014-05-08 DIAGNOSIS — I10 Essential (primary) hypertension: Secondary | ICD-10-CM

## 2014-05-08 DIAGNOSIS — M549 Dorsalgia, unspecified: Secondary | ICD-10-CM | POA: Insufficient documentation

## 2014-05-08 DIAGNOSIS — R079 Chest pain, unspecified: Secondary | ICD-10-CM | POA: Insufficient documentation

## 2014-05-08 DIAGNOSIS — R071 Chest pain on breathing: Secondary | ICD-10-CM | POA: Insufficient documentation

## 2014-05-08 DIAGNOSIS — Z87891 Personal history of nicotine dependence: Secondary | ICD-10-CM | POA: Insufficient documentation

## 2014-05-08 DIAGNOSIS — J40 Bronchitis, not specified as acute or chronic: Secondary | ICD-10-CM

## 2014-05-08 DIAGNOSIS — J45901 Unspecified asthma with (acute) exacerbation: Secondary | ICD-10-CM | POA: Insufficient documentation

## 2014-05-08 LAB — I-STAT TROPONIN, ED: TROPONIN I, POC: 0 ng/mL (ref 0.00–0.08)

## 2014-05-08 LAB — I-STAT CHEM 8, ED
BUN: 5 mg/dL — ABNORMAL LOW (ref 6–23)
CALCIUM ION: 1.2 mmol/L (ref 1.12–1.23)
CHLORIDE: 107 meq/L (ref 96–112)
Creatinine, Ser: 0.6 mg/dL (ref 0.50–1.10)
Glucose, Bld: 92 mg/dL (ref 70–99)
HEMATOCRIT: 29 % — AB (ref 36.0–46.0)
HEMOGLOBIN: 9.9 g/dL — AB (ref 12.0–15.0)
Potassium: 3.5 mEq/L — ABNORMAL LOW (ref 3.7–5.3)
Sodium: 143 mEq/L (ref 137–147)
TCO2: 23 mmol/L (ref 0–100)

## 2014-05-08 IMAGING — CR DG CHEST 2V
2 series · 2 of 2 positions shown · non-contrast
Comparison: [DATE].

CLINICAL DATA: Left chest pain. Back pain. Dry cough. Former
smoker.

EXAM:
CHEST  2 VIEW

[w chest pa]
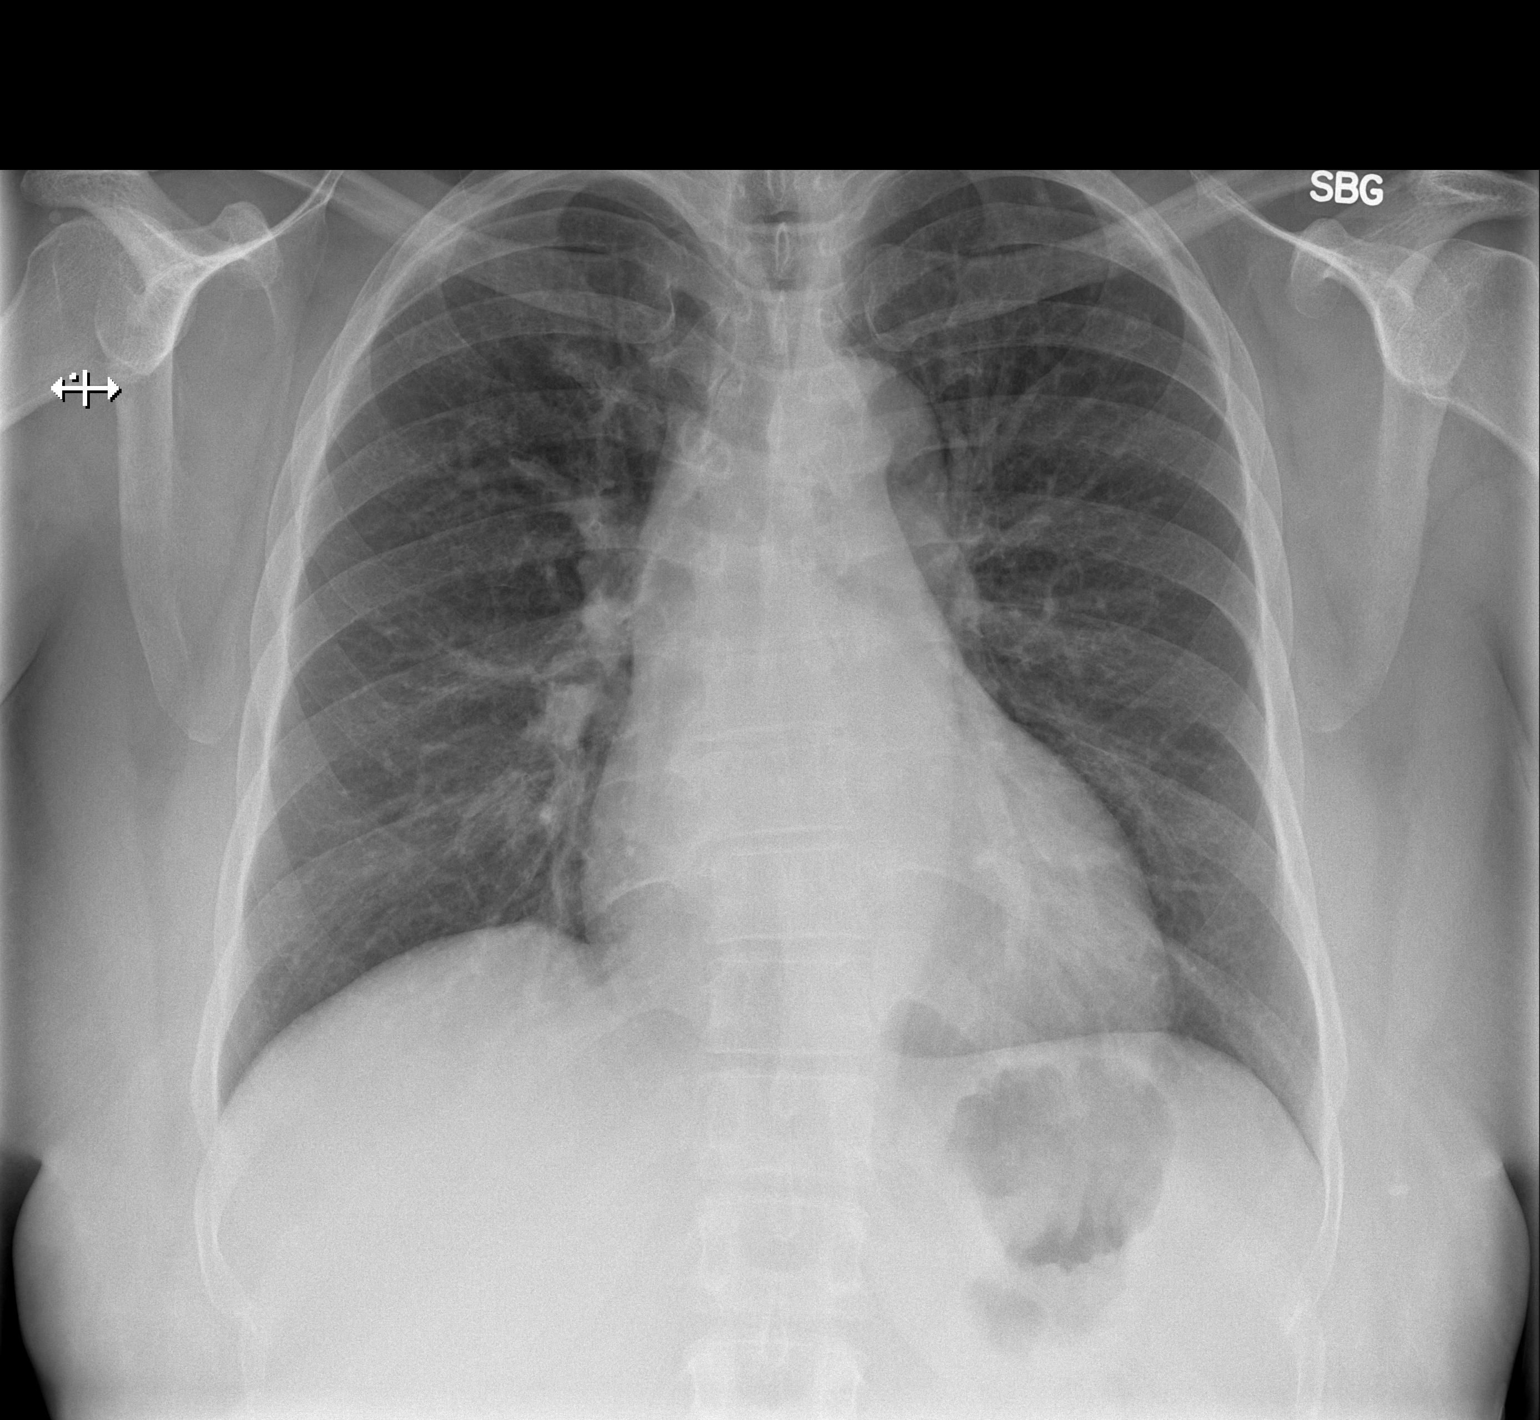

[w chest lat]
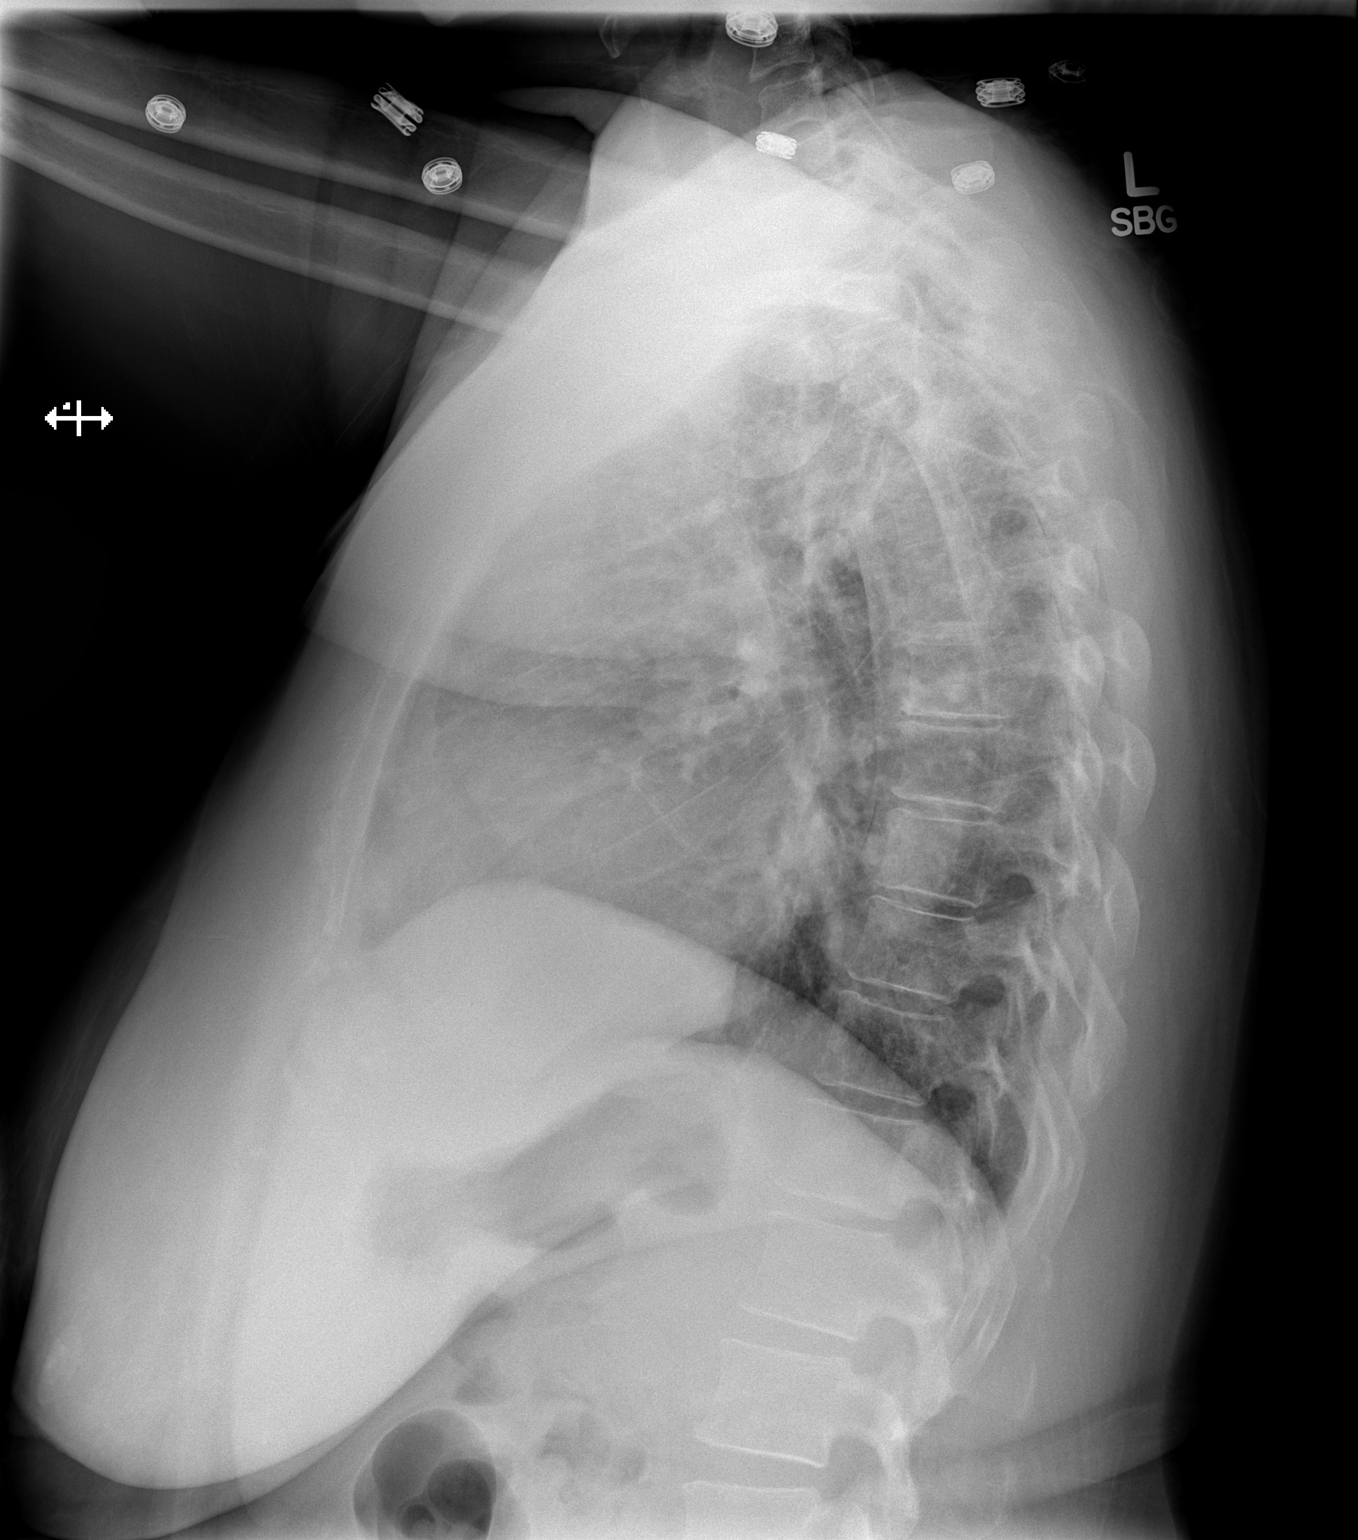

[2 of 2 positions shown; findings below may reference images not displayed]

FINDINGS: Stable borderline enlarged cardiac silhouette and mildly prominent
pulmonary vasculature. Clear lungs with minimally prominent
interstitial markings and minimal diffuse peribronchial thickening.
Mild thoracic spine degenerative changes.
IMPRESSION: 1. Minimal bronchitic changes.
2. Stable borderline cardiomegaly and mild pulmonary vascular
congestion.

## 2014-05-08 MED ORDER — HYDROXYZINE HCL 25 MG PO TABS: 25.0000 mg | ORAL_TABLET | Freq: Four times a day (QID) | ORAL | Status: DC

## 2014-05-08 MED ORDER — NAPROXEN 500 MG PO TABS
500.0000 mg | ORAL_TABLET | Freq: Two times a day (BID) | ORAL | Status: DC
Start: 2014-05-08 — End: 2015-06-27

## 2014-05-08 MED ORDER — LISINOPRIL-HYDROCHLOROTHIAZIDE 20-12.5 MG PO TABS: 1.0000 | ORAL_TABLET | Freq: Every day | ORAL | Status: DC

## 2014-05-08 MED ORDER — ALBUTEROL SULFATE HFA 108 (90 BASE) MCG/ACT IN AERS
2.0000 | INHALATION_SPRAY | Freq: Four times a day (QID) | RESPIRATORY_TRACT | Status: DC
Start: 2014-05-08 — End: 2014-05-08
  Administered 2014-05-08: 2 via RESPIRATORY_TRACT
  Filled 2014-05-08: qty 6.7

## 2014-05-08 NOTE — ED Notes (Signed)
Pt reports not feeling well since wed. Having left side chest pains that radiates into her shoulder and back. Having dizziness and cough, cold symptoms and congestion. ekg done at triage, no acute distress noted.

## 2014-05-08 NOTE — ED Notes (Signed)
To x-ray

## 2014-05-08 NOTE — ED Provider Notes (Signed)
CSN: [id number]     Arrival date & time 05/08/14  0846 History   First MD Initiated Contact with Patient 05/08/14 [phone]     Chief Complaint  Patient presents with  . Chest Pain  . URI     (Consider location/radiation/quality/duration/timing/severity/associated sxs/prior Treatment) Patient is a 48 y.o. female presenting with chest pain and URI. The history is provided by the patient.  Chest Pain Associated symptoms: back pain, cough, dizziness and shortness of breath   Associated symptoms: no abdominal pain, no fever, no nausea and not vomiting   URI Presenting symptoms: congestion and cough   Presenting symptoms: no fever    patient with history of left-sided chest pain waxing and waning but constant for 3 days. Radiates up and around to the back not through to the back. Some pain with movement of the left arm. Associated with shortness of breath no nausea no vomiting some dizziness but not vertigo. Pain described as sharp in nature and burning in nature patient is states the pain is 9/10. No distinct injury the patient does and that she uses her arms a lot at work. No history of similar pain.  Past Medical History  Diagnosis Date  . Headache(784.0)   . Infection   . Asthma   . Urinary tract infection   . Fibroid   . Hypertension    Past Surgical History  Procedure Laterality Date  . Breast surgery      bilateral cysts/benign   Family History  Problem Relation Age of Onset  . Other Neg Hx    History  Substance Use Topics  . Smoking status: Former Research scientist (life sciences)  . Smokeless tobacco: Not on file     Comment: as teenager  . Alcohol Use: No   OB History   Grav Para Term Preterm Abortions TAB SAB Ect Mult Living   3 3 3  0 0 0 0 0 0 3     Review of Systems  Constitutional: Negative for fever.  HENT: Positive for congestion.   Eyes: Negative for visual disturbance.  Respiratory: Positive for cough and shortness of breath.   Cardiovascular: Positive for chest pain. Negative for  leg swelling.  Gastrointestinal: Negative for nausea, vomiting and abdominal pain.  Genitourinary: Negative for dysuria.  Musculoskeletal: Positive for back pain.  Skin: Negative for rash.  Neurological: Positive for dizziness.  Hematological: Does not bruise/bleed easily.  Psychiatric/Behavioral: Negative for confusion.      Allergies  Review of patient's allergies indicates no known allergies.  Home Medications   Prior to Admission medications   Medication Sig Start Date End Date Taking? Authorizing Provider  albuterol (PROVENTIL HFA;VENTOLIN HFA) 108 (90 BASE) MCG/ACT inhaler Inhale 2-3 puffs into the lungs every 6 (six) hours as needed for wheezing or shortness of breath. Asthma attacks   Yes Historical Provider, MD  diphenhydrAMINE (BENADRYL) 25 mg capsule Take 50 mg by mouth every 6 (six) hours as needed for itching.   Yes Historical Provider, MD  hydrOXYzine (ATARAX/VISTARIL) 25 MG tablet Take 1 tablet (25 mg total) by mouth every 6 (six) hours as needed for itching. 01/22/13  Yes Liam Graham, PA-C  ibuprofen (ADVIL,MOTRIN) 800 MG tablet Take 800 mg by mouth every 8 (eight) hours as needed for moderate pain.   Yes Historical Provider, MD  lisinopril-hydrochlorothiazide (PRINZIDE,ZESTORETIC) 20-25 MG per tablet Take 1 tablet by mouth daily. 07/31/13  Yes Domenic Moras, PA-C   BP 145/82  Pulse 65  Temp(Src) 97.4 F (36.3 C) (Oral)  Resp  14  SpO2 100%  LMP 03/31/2014 Physical Exam  Nursing note and vitals reviewed. Constitutional: She is oriented to person, place, and time. She appears well-developed and well-nourished. No distress.  HENT:  Head: Normocephalic and atraumatic.  Mouth/Throat: Oropharynx is clear and moist.  Eyes: Conjunctivae and EOM are normal. Pupils are equal, round, and reactive to light.  Neck: Normal range of motion.  Cardiovascular: Normal rate, regular rhythm and normal heart sounds.   No murmur heard. Pulmonary/Chest: Effort normal and breath  sounds normal. No respiratory distress. She has no wheezes.  Abdominal: Soft. Bowel sounds are normal. There is no tenderness.  Musculoskeletal: Normal range of motion. She exhibits no edema.  Neurological: She is alert and oriented to person, place, and time. No cranial nerve deficit. She exhibits normal muscle tone. Coordination normal.  Skin: Skin is warm. No rash noted.    ED Course  Procedures (including critical care time) Labs Review Labs Reviewed  I-STAT CHEM 8, ED - Abnormal; Notable for the following:    Potassium 3.5 (*)    BUN 5 (*)    Hemoglobin 9.9 (*)    HCT 29.0 (*)    All other components within normal limits  I-STAT TROPOININ, ED   Results for orders placed during the hospital encounter of 05/08/14  I-STAT TROPOININ, ED      Result Value Ref Range   Troponin i, poc 0.00  0.00 - 0.08 ng/mL   Comment 3           I-STAT CHEM 8, ED      Result Value Ref Range   Sodium 143  137 - 147 mEq/L   Potassium 3.5 (*) 3.7 - 5.3 mEq/L   Chloride 107  96 - 112 mEq/L   BUN 5 (*) 6 - 23 mg/dL   Creatinine, Ser 0.60  0.50 - 1.10 mg/dL   Glucose, Bld 92  70 - 99 mg/dL   Calcium, Ion 1.20  1.12 - 1.23 mmol/L   TCO2 23  0 - 100 mmol/L   Hemoglobin 9.9 (*) 12.0 - 15.0 g/dL   HCT 29.0 (*) 36.0 - 46.0 %   Results for orders placed during the hospital encounter of 05/08/14  I-STAT TROPOININ, ED      Result Value Ref Range   Troponin i, poc 0.00  0.00 - 0.08 ng/mL   Comment 3           I-STAT CHEM 8, ED      Result Value Ref Range   Sodium 143  137 - 147 mEq/L   Potassium 3.5 (*) 3.7 - 5.3 mEq/L   Chloride 107  96 - 112 mEq/L   BUN 5 (*) 6 - 23 mg/dL   Creatinine, Ser 0.60  0.50 - 1.10 mg/dL   Glucose, Bld 92  70 - 99 mg/dL   Calcium, Ion 1.20  1.12 - 1.23 mmol/L   TCO2 23  0 - 100 mmol/L   Hemoglobin 9.9 (*) 12.0 - 15.0 g/dL   HCT 29.0 (*) 36.0 - 46.0 %   Dg Chest 2 View  05/08/2014   CLINICAL DATA:  Left chest pain. Back pain. Dry cough. Former smoker.  EXAM: CHEST  2  VIEW  COMPARISON:  12/31/2008.  FINDINGS: Stable borderline enlarged cardiac silhouette and mildly prominent pulmonary vasculature. Clear lungs with minimally prominent interstitial markings and minimal diffuse peribronchial thickening. Mild thoracic spine degenerative changes.  IMPRESSION: 1. Minimal bronchitic changes. 2. Stable borderline cardiomegaly and mild pulmonary vascular congestion.  Electronically Signed   By: Enrique Sack M.D.   On: 05/08/2014 10:10      Imaging Review Dg Chest 2 View  05/08/2014   CLINICAL DATA:  Left chest pain. Back pain. Dry cough. Former smoker.  EXAM: CHEST  2 VIEW  COMPARISON:  12/31/2008.  FINDINGS: Stable borderline enlarged cardiac silhouette and mildly prominent pulmonary vasculature. Clear lungs with minimally prominent interstitial markings and minimal diffuse peribronchial thickening. Mild thoracic spine degenerative changes.  IMPRESSION: 1. Minimal bronchitic changes. 2. Stable borderline cardiomegaly and mild pulmonary vascular congestion.   Electronically Signed   By: Enrique Sack M.D.   On: 05/08/2014 10:10     EKG Interpretation   Date/Time:  Saturday May 08 2014 08:55:20 EDT Ventricular Rate:  75 PR Interval:  192 QRS Duration: 94 QT Interval:  414 QTC Calculation: 462 R Axis:   -4 Text Interpretation:  Normal sinus rhythm Moderate voltage criteria for  LVH, may be normal variant Borderline ECG Confirmed by Amori Colomb  MD,  Essance Gatti ([id number]) on 05/08/2014 9:14:54 AM      MDM   Final diagnoses:  Chest pain, unspecified chest pain type  Bronchitis  URI (upper respiratory infection)   Patient with chest pain for 3 days. No evidence of acute cardiac event. Pain probably related to the cough and bronchitis. We'll treat without uterine wall inhaler and now pain medicine for the chest wall. Patient noted to have elevated blood pressure here followup to have that rechecked would be important information passed on to the patient. She does have  a past history of hypertension. Is on medication.    Fredia Sorrow, MD 05/08/14 1021

## 2014-05-08 NOTE — Discharge Instructions (Signed)
As we discussed she is to be a role inhaler 2 puffs every 6 hours for the next 7 days. Take the Naprosyn as needed for the chest pain. Your blood pressure and itching meds have  been renewed.   Emergency Department Resource Guide 1) Find a Doctor and Pay Out of Pocket Although you won't have to find out who is covered by your insurance plan, it is a good idea to ask around and get recommendations. You will then need to call the office and see if the doctor you have chosen will accept you as a new patient and what types of options they offer for patients who are self-pay. Some doctors offer discounts or will set up payment plans for their patients who do not have insurance, but you will need to ask so you aren't surprised when you get to your appointment.  2) Contact Your Local Health Department Not all health departments have doctors that can see patients for sick visits, but many do, so it is worth a call to see if yours does. If you don't know where your local health department is, you can check in your phone book. The CDC also has a tool to help you locate your state's health department, and many state websites also have listings of all of their local health departments.  3) Find a [facility] If your illness is not likely to be very severe or complicated, you may want to try a walk in clinic. These are popping up all over the country in pharmacies, drugstores, and shopping centers. They're usually staffed by nurse practitioners or physician assistants that have been trained to treat common illnesses and complaints. They're usually fairly quick and inexpensive. However, if you have serious medical issues or chronic medical problems, these are probably not your best option.  No Primary Care Doctor: - Call Health Connect at  [phone] - they can help you locate a primary care doctor that  accepts your insurance, provides certain services, etc. - Physician Referral Service- [phone]  Chronic  Pain Problems: Organization         Address  Phone   Notes  [facility]  [phone] Patients need to be referred by their primary care doctor.   Medication Assistance: Organization         Address  Phone   Notes  [facility] Medication [facility] [place], [place] [address] [phone] --Must be a resident of [facility] -- Must have NO insurance coverage whatsoever (no Medicaid/ Medicare, etc.) -- The pt. MUST have a primary care doctor that directs their care regularly and follows them in the community   MedAssist  [phone]   [employer]  [phone]    Agencies that provide inexpensive medical care: Organization         Address  Phone   Notes  [place]  [phone]   [facility]    [phone]   [facility], [place] [address] [phone]   [place] [address], [place] [phone]   Planned Parenthood    [phone]   [facility]    [phone]   [place] and [place], [place] Phone:  [phone], Fax:  [phone] Hours of Operation:  9 am - 6 pm, M-F.  Also accepts Medicaid/Medicare and self-pay.  [facility] for [place], [address], [place] Phone: [phone], Fax: [phone]. Hours of Operation:  8:30 am - 5:30 pm, M-F.  Also accepts Medicaid and self-pay.  [facility] [place] [address] [place] Phone: [phone]   [place], [place], [place] [phone], Ext. [phone] Mondays & Thursdays: 7-9 AM.  First 15 patients are seen on a first come, first serve basis.    [place] Providers:  Organization         Address  Phone   Notes  [facility] [address], [place] [phone] Also  accepts self-pay patients.  [facility] [address], [place]  [phone]   [place], [address], [place] [phone]   [facility] [address] [place] [phone]   Lucianne Lei [address], [place]   [phone] Only accepts [place] Access [place] patients after they have their name applied to their card.   Self-Pay (no insurance) in [facility]:  Organization         Address  Phone   Notes  Sickle Cell Patients, [facility] [place] [phone]   [facility] [place] [phone]   [facility] [place]  [place], [place], [place] [phone]   [facility]/Dr. Osei-Bonsu  [address], [place] [address], [place] [phone] Phone number for both [place] and [place] locations is the same.  [facility] [address], [place] [phone]   [facility] [address], [place] or [address] [phone]   [facility] [address] [place] [phone], phone; [phone], fax Sees patients 1st and 3rd Saturday of every month.  Must not qualify for public or private insurance (i.e. Medicaid, Medicare, [place] Health Choice, Veterans' Benefits)  Household income should be no more than 200% of the poverty level The clinic cannot treat you if you are pregnant or think you are pregnant  Sexually transmitted diseases are not treated at the clinic.    Dental Care: Organization         Address  Phone  Notes  [facility] Department of [facility] [place] [phone] Accepts children up to age 40 who are enrolled in [place] or [place]; pregnant  women with a Medicaid card; and children who have applied for Medicaid or  Health Choice, but were declined, whose parents can pay a reduced fee at time of service.  [facility] Department of [facility]  [address], [place] [phone] Accepts children up to age 54 who are enrolled in [place] or [place]; pregnant women with a Medicaid card; and children who have applied for Medicaid or  Health Choice, but were declined, whose parents can pay a reduced fee at time of service.  [facility] Access PROGRAM  [place] [phone] Patients are seen by appointment only. Walk-ins are not accepted. [place] will see patients 60 years of age and older. Monday - Tuesday (8am-5pm) Most Wednesdays (8:30-5pm) $30 per  visit, cash only  [facility] Adult [employer] PROGRAM  [address], [facility] [phone] Patients are seen by appointment only. Walk-ins are not accepted. [place] will see patients 68 years of age and older. One Wednesday Evening (Monthly: Volunteer Based).  $30 per visit, cash only  [place]  [phone] for adults; Children under age 73, call Graduate Pediatric Dentistry at [phone]. Children aged 38-14, please call [phone] to request a pediatric application.  Dental services are provided in all areas of dental care including fillings, crowns and bridges, complete and partial dentures, implants, gum treatment, root canals, and extractions. Preventive care is also provided. Treatment is provided to both adults and children. Patients are selected via a lottery and there is often a waiting list.   [facility] [address], [place]  [phone] [email or web address]   [facility] [address] [place], [place] [phone], Ext. [phone] Second and Fourth Thursday of each month, opens at 6:30 AM; Clinic ends at 9 AM.  Patients are  seen on a first-come first-served basis, and a limited number are seen during each clinic.   [facility]  [address] Hillard Danker [place], [place] [phone]   Eligibility Requirements You must have lived in [place], [place], or [place] counties for at least the last three months.   You cannot be eligible for state or federal sponsored [employer], including [employer], [place], or [employer].   You generally cannot be eligible for healthcare insurance through your employer.    How to apply: Eligibility screenings are held every Tuesday and Wednesday afternoon from 1:00 pm until 4:00 pm. You do not need an appointment for the interview!  [facility] [address] [place], [place]   [place]  [place] Department  [place]  [phone]    Behavioral Health Resources in the Community: Intensive Outpatient Programs Organization         Address  Phone  Notes  [place]. [address], [place], [place] [phone]   [facility] Outpatient [address] [place], [place]   ADS: Alcohol & Drug Svcs [address], [place], [place]   [place] [address]  [place], [place] or [phone]   Substance Abuse Resources Organization         Address  Phone  Notes  Alcohol and Drug Services  [phone]   [place]  [phone]   [facility]   Chinita Pester  [phone]   Residential & Outpatient Substance Abuse Program  [phone]   Psychological Services Organization         Address  Phone  Notes  [facility] [place]  [place]  [phone]   [place] [address], [place] or [phone]    Mobile Crisis  Teams Organization         Address  Phone  Notes  [facility], Mobile Crisis Care Unit  [phone]   [facility]ive Psychotherapeutic Services  [address] [place], [place]   [place] [address] [place] [phone]    Self-Help/Support Groups Organization         Address  Phone  Notes  Mental Health Assoc. of [place] - variety of support groups  [place] Call for more information  Narcotics Anonymous (NA), [facility] [address], [employer] [place]  2 meetings at this location   Special educational needs teacher         Address  Phone  Notes  [facility] [place],    [place]  [phone]   [facility]  [address], [place] [phone], [place], [place]   [place], [place] [phone] Admissions: 8am-3pm M-F  Incentives Substance [place] [address],    [place], [place] [phone]   [facility] [address] [place], [place], [place]   [facility] [address],  [place], [place]   [facility] Programs - Intensive Outpatient [address], [place], [place]   [facility] ([place].,  [place], [place] [phone] or [phone]   [facility]idential Treatment Services (RTS) [address], [place], [place] Accepts Medicaid  [facility] [address]  [place] [phone] Substance Abuse/Addiction Treatment   [facility] Organization         Address  Phone  Notes  [facility]  [phone]   Domenic Schwab, PhD [address] Arlis Porta [place], [place]   [phone] or [phone]   [place], [place] [phone]   [facility] [address] [place], [place] [phone]  Insurance/Medicaid/sponsorship through [facility] and Families [address], [address] [place]                                    [place], [place] [phone] [place] [address], [place] [phone]    Dr. Adele Schilder  [phone]   [facility] Dept. 1) [address], [place] 2) [place] 3)  [place] [address], [place] [phone]  [phone]   [place] [phone] or [phone] (After Hours)

## 2014-06-21 ENCOUNTER — Encounter (HOSPITAL_COMMUNITY): Payer: Self-pay | Admitting: Emergency Medicine

## 2014-08-06 ENCOUNTER — Other Ambulatory Visit: Payer: Self-pay | Admitting: Occupational Medicine

## 2014-08-06 ENCOUNTER — Ambulatory Visit: Payer: Self-pay

## 2014-08-06 DIAGNOSIS — R52 Pain, unspecified: Secondary | ICD-10-CM
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
## 2014-09-16 ENCOUNTER — Encounter (HOSPITAL_COMMUNITY): Payer: Self-pay | Admitting: Emergency Medicine

## 2014-09-16 ENCOUNTER — Emergency Department (HOSPITAL_COMMUNITY)
Admission: EM | Admit: 2014-09-16 | Discharge: 2014-09-16 | Disposition: A | Discharge status: Home / Self Care | Payer: No Typology Code available for payment source | Attending: Emergency Medicine | Admitting: Emergency Medicine

## 2014-09-16 DIAGNOSIS — Z79899 Other long term (current) drug therapy: Secondary | ICD-10-CM | POA: Insufficient documentation

## 2014-09-16 DIAGNOSIS — Z87891 Personal history of nicotine dependence: Secondary | ICD-10-CM | POA: Insufficient documentation

## 2014-09-16 DIAGNOSIS — Z862 Personal history of diseases of the blood and blood-forming organs and certain disorders involving the immune mechanism: Secondary | ICD-10-CM | POA: Insufficient documentation

## 2014-09-16 DIAGNOSIS — Z791 Long term (current) use of non-steroidal anti-inflammatories (NSAID): Secondary | ICD-10-CM | POA: Insufficient documentation

## 2014-09-16 DIAGNOSIS — I10 Essential (primary) hypertension: Secondary | ICD-10-CM | POA: Insufficient documentation

## 2014-09-16 DIAGNOSIS — T7840XA Allergy, unspecified, initial encounter: Secondary | ICD-10-CM

## 2014-09-16 DIAGNOSIS — J45909 Unspecified asthma, uncomplicated: Secondary | ICD-10-CM | POA: Insufficient documentation

## 2014-09-16 DIAGNOSIS — Z8619 Personal history of other infectious and parasitic diseases: Secondary | ICD-10-CM | POA: Insufficient documentation

## 2014-09-16 DIAGNOSIS — L252 Unspecified contact dermatitis due to dyes: Secondary | ICD-10-CM | POA: Insufficient documentation

## 2014-09-16 DIAGNOSIS — Z87440 Personal history of urinary (tract) infections: Secondary | ICD-10-CM | POA: Insufficient documentation

## 2014-09-16 MED ORDER — METHYLPREDNISOLONE SODIUM SUCC 125 MG IJ SOLR
125.0000 mg | Freq: Once | INTRAMUSCULAR | Status: AC
  Administered 2014-09-16: 125 mg via INTRAVENOUS
  Filled 2014-09-16: qty 2

## 2014-09-16 MED ORDER — DIPHENHYDRAMINE HCL 50 MG/ML IJ SOLN
25.0000 mg | Freq: Once | INTRAMUSCULAR | Status: AC
  Administered 2014-09-16: 25 mg via INTRAVENOUS
  Filled 2014-09-16: qty 1

## 2014-09-16 MED ORDER — PREDNISONE 20 MG PO TABS
ORAL_TABLET | ORAL | Status: DC
Start: 2014-09-16 — End: 2015-06-27

## 2014-09-16 MED ORDER — CETIRIZINE HCL 10 MG PO TABS: 10.0000 mg | ORAL_TABLET | Freq: Every day | ORAL | Status: DC

## 2014-09-16 NOTE — ED Notes (Signed)
Pt sts thinks she is having allergic reaction from hair dye; pt sts took benadryl prior to arrival; some burning and itching to head and swelling to eyes

## 2014-09-16 NOTE — ED Provider Notes (Signed)
CSN: [id number]     Arrival date & time 09/16/14  1447 History   First MD Initiated Contact with Patient 09/16/14 1642     Chief Complaint  Patient presents with  . Allergic Reaction   Evey Mcmahan is a 49 y.o. female with a history of hypertension and asthma who presents the emergency department complaining of an allergic reaction to her hair dye. Patient reports she used her hair dye yesterday and woke up this morning with burning, itching and swelling to her face. Patient reports she has used this hair dye previously without issues. She denies other changes to her lotions, soaps, perfumes or detergents. Patient reports this is the only change recently. Patient reports she thoroughly washed out the dye out of her hair. Patient reports taking 50 mg of Benadryl at 11 AM this morning with some relief. She denies any recent changes to her medications.  Patient denies fevers, chills, trouble swallowing, throat swelling, difficulty breathing, cough, wheezing, abdominal pain, nausea, vomiting, numbness or tingling.  (Consider location/radiation/quality/duration/timing/severity/associated sxs/prior Treatment) HPI  Past Medical History  Diagnosis Date  . Headache(784.0)   . Infection   . Asthma   . Urinary tract infection   . Fibroid   . Hypertension    Past Surgical History  Procedure Laterality Date  . Breast surgery      bilateral cysts/benign   Family History  Problem Relation Age of Onset  . Other Neg Hx    History  Substance Use Topics  . Smoking status: Former Research scientist (life sciences)  . Smokeless tobacco: Not on file     Comment: as teenager  . Alcohol Use: No   OB History    Gravida Para Term Preterm AB TAB SAB Ectopic Multiple Living   3 3 3  0 0 0 0 0 0 3     Review of Systems  Constitutional: Negative for fever and chills.  HENT: Positive for facial swelling. Negative for congestion, ear discharge, ear pain, sore throat and trouble swallowing.   Eyes: Positive for itching. Negative  for pain and visual disturbance.  Respiratory: Negative for cough, chest tightness, shortness of breath and wheezing.   Cardiovascular: Negative for chest pain and palpitations.  Gastrointestinal: Negative for nausea, vomiting, abdominal pain and diarrhea.  Genitourinary: Negative for dysuria.  Musculoskeletal: Negative for back pain and neck pain.  Skin: Positive for rash.  Neurological: Negative for syncope, numbness and headaches.      Allergies  Review of patient's allergies indicates no known allergies.  Home Medications   Prior to Admission medications   Medication Sig Start Date End Date Taking? Authorizing Provider  albuterol (PROVENTIL HFA;VENTOLIN HFA) 108 (90 BASE) MCG/ACT inhaler Inhale 2-3 puffs into the lungs every 6 (six) hours as needed for wheezing or shortness of breath. Asthma attacks    Historical Provider, MD  cetirizine (ZYRTEC ALLERGY) 10 MG tablet Take 1 tablet (10 mg total) by mouth daily. 09/16/14   Verda Cumins Tamika Shropshire, PA-C  diphenhydrAMINE (BENADRYL) 25 mg capsule Take 50 mg by mouth every 6 (six) hours as needed for itching.    Historical Provider, MD  hydrOXYzine (ATARAX/VISTARIL) 25 MG tablet Take 1 tablet (25 mg total) by mouth every 6 (six) hours as needed for itching. 01/22/13   Liam Graham, PA-C  hydrOXYzine (ATARAX/VISTARIL) 25 MG tablet Take 1 tablet (25 mg total) by mouth every 6 (six) hours. 05/08/14   Fredia Sorrow, MD  ibuprofen (ADVIL,MOTRIN) 800 MG tablet Take 800 mg by mouth every 8 (eight) hours  as needed for moderate pain.    Historical Provider, MD  lisinopril-hydrochlorothiazide (PRINZIDE,ZESTORETIC) 20-25 MG per tablet Take 1 tablet by mouth daily. 07/31/13   Domenic Moras, PA-C  lisinopril-hydrochlorothiazide (ZESTORETIC) 20-12.5 MG per tablet Take 1 tablet by mouth daily. 05/08/14   Fredia Sorrow, MD  naproxen (NAPROSYN) 500 MG tablet Take 1 tablet (500 mg total) by mouth 2 (two) times daily. 05/08/14   Fredia Sorrow, MD  predniSONE  (DELTASONE) 20 MG tablet Take two tablets (40 mg total) x2 days. Take 1 tablet (20 mg total) x 1 day and stop. 09/16/14   Verda Cumins Zinnia Tindall, PA-C   BP 128/78 mmHg  Pulse 79  Temp(Src) 98.6 F (37 C) (Oral)  Resp 20  Ht 5' (1.524 m)  Wt 156 lb (70.761 kg)  BMI 30.47 kg/m2  SpO2 97% Physical Exam  Constitutional: She is oriented to person, place, and time. She appears well-developed and well-nourished. No distress.  HENT:  Head: Normocephalic and atraumatic.  Right Ear: External ear normal.  Left Ear: External ear normal.  Nose: Nose normal.  Mouth/Throat: Oropharynx is clear and moist. No oropharyngeal exudate.  Mild edema surrounding eyelids and cheeks. Slight papular rash to bilateral cheeks. Bilateral tympanic membranes are pearly-gray without erythema or loss of landmarks. No oropharyngeal edema noted. Soft palate rises symmetrically. Uvula is midline without edema.  Eyes: Conjunctivae are normal. Pupils are equal, round, and reactive to light. Right eye exhibits no discharge. Left eye exhibits no discharge.  Neck: Neck supple.  Cardiovascular: Normal rate, regular rhythm, normal heart sounds and intact distal pulses.  Exam reveals no gallop and no friction rub.   No murmur heard. Pulmonary/Chest: Effort normal and breath sounds normal. No respiratory distress. She has no wheezes. She has no rales.  Abdominal: Soft. There is no tenderness.  Musculoskeletal: She exhibits no edema.  Lymphadenopathy:    She has no cervical adenopathy.  Neurological: She is alert and oriented to person, place, and time. Coordination normal.  Skin: Skin is warm and dry. Rash noted. She is not diaphoretic. No erythema. No pallor.  Slight maculopapular rash to bilateral cheeks. No evidence of hives present.   Psychiatric: She has a normal mood and affect. Her behavior is normal.  Nursing note and vitals reviewed.   ED Course  Procedures (including critical care time) Labs Review Labs Reviewed -  No data to display  Imaging Review No results found.   EKG Interpretation None      Filed Vitals:   09/16/14 1645 09/16/14 1730 09/16/14 1745 09/16/14 1759  BP: 134/86 121/71 128/78   Pulse: 84 73 79   Temp:    98.6 F (37 C)  TempSrc:      Resp: 20     Height:      Weight:      SpO2: 99% 100% 97%      MDM   Meds given in ED:  Medications  diphenhydrAMINE (BENADRYL) injection 25 mg (25 mg Intravenous Given 09/16/14 1659)  methylPREDNISolone sodium succinate (SOLU-MEDROL) 125 mg/2 mL injection 125 mg (125 mg Intravenous Given 09/16/14 1659)    New Prescriptions   CETIRIZINE (ZYRTEC ALLERGY) 10 MG TABLET    Take 1 tablet (10 mg total) by mouth daily.   PREDNISONE (DELTASONE) 20 MG TABLET    Take two tablets (40 mg total) x2 days. Take 1 tablet (20 mg total) x 1 day and stop.    Final diagnoses:  Allergic reaction, initial encounter   This is a 49 year old  female who presents the emergency department complaining of an allergic reaction from her hair dye. Patient reports she this morning after using her head with burning, itching and swelling to her face. She denies any changes to her medications, lotions, soaps, or perfumes. Patient denies any tongue swelling, trouble swallowing, difficulty breathing or cough. The patient is afebrile and nontoxic-appearing. The patient has edema around her eyes as well as cheeks. There is no throat, tongue or uvula edema noted. Patient received Benadryl and Solu-Medrol in the ED. At re-evaluation the patient reports she feels improved and is ready to be discharged. Patient appears improved since initial evaluation. We'll discharge the patient with prednisone and Zyrtec. Advised patient she also take Benadryl rather than Zyrtec. I advised the patient to follow-up with her primary care provider this week to ensure symptom resolution. Strict return precautions were provided. I advised the patient return to the emergency department with new or  worsening symptoms or new concerns. The patient verbalized understanding and agreement with plan.  This patient was discussed with Dr. Aline Brochure who agrees with assessment and plan.     Hanley Hays, PA-C 09/16/14 1806  Pamella Pert, MD 09/17/14 Laureen Abrahams

## 2014-09-16 NOTE — Discharge Instructions (Signed)

## 2015-06-27 ENCOUNTER — Other Ambulatory Visit: Payer: Self-pay | Admitting: Family Medicine

## 2015-06-27 ENCOUNTER — Encounter: Payer: Self-pay | Admitting: Family Medicine

## 2015-06-27 ENCOUNTER — Ambulatory Visit (INDEPENDENT_AMBULATORY_CARE_PROVIDER_SITE_OTHER): Payer: Self-pay | Admitting: Family Medicine

## 2015-06-27 VITALS — BP 158/88 | HR 80 | Temp 98.2°F | Resp 16 | Ht 61.0 in | Wt 204.0 lb

## 2015-06-27 DIAGNOSIS — E8881 Metabolic syndrome: Secondary | ICD-10-CM

## 2015-06-27 DIAGNOSIS — Z0000 Encounter for general adult medical examination without abnormal findings: Secondary | ICD-10-CM

## 2015-06-27 DIAGNOSIS — Z23 Encounter for immunization: Secondary | ICD-10-CM

## 2015-06-27 DIAGNOSIS — I10 Essential (primary) hypertension: Secondary | ICD-10-CM

## 2015-06-27 DIAGNOSIS — J4520 Mild intermittent asthma, uncomplicated: Secondary | ICD-10-CM

## 2015-06-27 DIAGNOSIS — M25561 Pain in right knee: Secondary | ICD-10-CM

## 2015-06-27 DIAGNOSIS — M25562 Pain in left knee: Secondary | ICD-10-CM

## 2015-06-27 DIAGNOSIS — Z1239 Encounter for other screening for malignant neoplasm of breast: Secondary | ICD-10-CM

## 2015-06-27 LAB — CBC WITH DIFFERENTIAL/PLATELET
BASOS PCT: 1 % (ref 0–1)
Basophils Absolute: 0.1 10*3/uL (ref 0.0–0.1)
Eosinophils Absolute: 0.1 10*3/uL (ref 0.0–0.7)
Eosinophils Relative: 2 % (ref 0–5)
HEMATOCRIT: 23.3 % — AB (ref 36.0–46.0)
HEMOGLOBIN: 6.8 g/dL — AB (ref 12.0–15.0)
LYMPHS PCT: 29 % (ref 12–46)
Lymphs Abs: 1.8 10*3/uL (ref 0.7–4.0)
MCH: 21.1 pg — ABNORMAL LOW (ref 26.0–34.0)
MCHC: 29.2 g/dL — AB (ref 30.0–36.0)
MCV: 72.4 fL — ABNORMAL LOW (ref 78.0–100.0)
MONO ABS: 0.6 10*3/uL (ref 0.1–1.0)
Monocytes Relative: 10 % (ref 3–12)
NEUTROS ABS: 3.7 10*3/uL (ref 1.7–7.7)
NEUTROS PCT: 58 % (ref 43–77)
Platelets: 101 10*3/uL — ABNORMAL LOW (ref 150–400)
RBC: 3.22 MIL/uL — AB (ref 3.87–5.11)
RDW: 21 % — ABNORMAL HIGH (ref 11.5–15.5)
WBC: 6.3 10*3/uL (ref 4.0–10.5)

## 2015-06-27 LAB — COMPLETE METABOLIC PANEL WITH GFR
ALBUMIN: 3.6 g/dL (ref 3.6–5.1)
ALT: 9 U/L (ref 6–29)
AST: 11 U/L (ref 10–35)
Alkaline Phosphatase: 72 U/L (ref 33–130)
BUN: 13 mg/dL (ref 7–25)
CALCIUM: 8.5 mg/dL — AB (ref 8.6–10.4)
CHLORIDE: 107 mmol/L (ref 98–110)
CO2: 22 mmol/L (ref 20–31)
CREATININE: 0.41 mg/dL — AB (ref 0.50–1.05)
GFR, Est African American: >89 mL/min (ref >=60)
GFR, Est Non African American: >89 mL/min (ref >=60)
Glucose, Bld: 79 mg/dL (ref 65–99)
POTASSIUM: 3.7 mmol/L (ref 3.5–5.3)
SODIUM: 140 mmol/L (ref 135–146)
TOTAL PROTEIN: 6.5 g/dL (ref 6.1–8.1)
Total Bilirubin: 0.4 mg/dL (ref 0.2–1.2)

## 2015-06-27 LAB — POCT URINALYSIS DIP (DEVICE)
Bilirubin Urine: NEGATIVE
Glucose, UA: NEGATIVE mg/dL
Ketones, ur: NEGATIVE mg/dL
Leukocytes, UA: NEGATIVE
NITRITE: NEGATIVE
PH: 6 (ref 5.0–8.0)
PROTEIN: NEGATIVE mg/dL
Specific Gravity, Urine: 1.025 (ref 1.005–1.030)
UROBILINOGEN UA: 0.2 mg/dL (ref 0.0–1.0)

## 2015-06-27 LAB — TSH: TSH: 1.086 u[IU]/mL (ref 0.350–4.500)

## 2015-06-27 LAB — LIPID PANEL
CHOL/HDL RATIO: 2 ratio (ref <=5.0)
Cholesterol: 157 mg/dL (ref 125–200)
HDL: 79 mg/dL (ref >=46)
LDL CALC: 69 mg/dL (ref <130)
Triglycerides: 44 mg/dL (ref <150)
VLDL: 9 mg/dL (ref <30)

## 2015-06-27 MED ORDER — ALBUTEROL SULFATE HFA 108 (90 BASE) MCG/ACT IN AERS: 2.0000 | INHALATION_SPRAY | Freq: Four times a day (QID) | RESPIRATORY_TRACT | Status: DC | PRN

## 2015-06-27 MED ORDER — MONTELUKAST SODIUM 10 MG PO TABS: 10.0000 mg | ORAL_TABLET | Freq: Every day | ORAL | Status: DC

## 2015-06-27 MED ORDER — MELOXICAM 7.5 MG PO TABS: 7.5000 mg | ORAL_TABLET | Freq: Every day | ORAL | Status: DC

## 2015-06-27 MED ORDER — LISINOPRIL-HYDROCHLOROTHIAZIDE 20-25 MG PO TABS
1.0000 | ORAL_TABLET | Freq: Every day | ORAL | Status: DC
Start: 2015-06-27 — End: 2015-06-28

## 2015-06-27 NOTE — Progress Notes (Signed)
4  Subjective:    Patient ID: Caroline Ryan, female    DOB: 07-10-1966, 50 y.o.   MRN: [id number]  HPI  Caroline Ryan, a 49 year old female presents to establish care. She reports a history of hypertension, asthma, and bilateral knee pain.  She reports that she has been out of antihypertensive medications over the past month. She states that she has not been exercising or following a lowfat, low sodium diet. She reports that she was previously taking Lisinopril-Hydrochlorothiazide 20-25 mg daily. She currently denies dizziness, fatigue, syncope, chest pains, heart palpitations, or swelling of lower extremities.   Caroline Ryan reports that she has a history of mild intermittent asthma. She states that she was diagnosed with asthma in 1998.  She maintains that she typically has exacerbations during season changes. She states that asthma is triggered by environmental allergens.  Associated symptoms usually include wheezing, chest tightness, and cough.She reports that she had wheezing several nights ago. She reports that she does not have an up to date rescue inhaler.  Caroline Ryan is complaining of bilateral knee pain. . Onset of the symptoms was several months ago. Patient has not identified an inciting event.  Current symptoms include stiffness, swelling and pain. Pain is aggravated by any weight bearing, going up and down stairs, lateral movements and pivoting.  Patient has had no prior knee problems. Patient denies having evaluation or treatment to date. She reports that pain intensity is 8/10 described as intermittent and aching. She last had Ibuprofen 800 mg this am with minimal relief.   Past Medical History  Diagnosis Date  . Headache(784.0)   . Infection   . Asthma   . Urinary tract infection   . Fibroid   . Hypertension    Social History   Social History  . Marital Status: Single    Spouse Name: N/A  . Number of Children: N/A  . Years of Education: N/A   Occupational History  .  Not on file.   Social History Main Topics  . Smoking status: Former Research scientist (life sciences)  . Smokeless tobacco: Not on file     Comment: as teenager  . Alcohol Use: No  . Drug Use: No  . Sexual Activity: Yes    Birth Control/ Protection: Condom   Other Topics Concern  . Not on file   Social History Narrative   Immunization History  Administered Date(s) Administered  . Influenza Whole 06/30/2009  . Pneumococcal Polysaccharide-23 04/14/2008  . Td 10/18/2005   Review of Systems  Constitutional: Negative for fever and fatigue.  HENT: Positive for dental problem.   Eyes: Negative for photophobia and visual disturbance.  Respiratory: Positive for wheezing (occasionally).   Cardiovascular: Negative.   Gastrointestinal: Negative.   Endocrine: Negative for polydipsia, polyphagia and polyuria.  Genitourinary: Negative.  Negative for vaginal discharge and vaginal pain.  Musculoskeletal: Positive for myalgias (bilateral knee pain).  Skin: Negative.   Allergic/Immunologic: Positive for environmental allergies.  Neurological: Negative.  Negative for dizziness, tremors, weakness and numbness.  Hematological: Negative.   Psychiatric/Behavioral: Negative for suicidal ideas and sleep disturbance.       Objective:   Physical Exam  Constitutional: She is oriented to person, place, and time. She appears well-developed and well-nourished.  HENT:  Head: Normocephalic and atraumatic.  Right Ear: Hearing, tympanic membrane, external ear and ear canal normal.  Left Ear: Hearing, tympanic membrane, external ear and ear canal normal.  Nose: Nose normal.  Mouth/Throat: Abnormal dentition. Dental caries present.  Eyes:  Conjunctivae and EOM are normal. Pupils are equal, round, and reactive to light.  Neck: Normal range of motion. Neck supple.  Cardiovascular: Normal rate, regular rhythm, normal heart sounds and intact distal pulses.   Pulmonary/Chest: Effort normal and breath sounds normal.  Abdominal: Soft.  Bowel sounds are normal.  Musculoskeletal:       Right knee: She exhibits decreased range of motion. She exhibits no erythema.       Left knee: She exhibits decreased range of motion. She exhibits no swelling, no ecchymosis and no erythema.  Neurological: She is alert and oriented to person, place, and time. She has normal reflexes.  Skin: Skin is warm and dry.  Psychiatric: She has a normal mood and affect. Her behavior is normal. Judgment and thought content normal.      BP 158/88 mmHg  Pulse 80  Temp(Src) 98.2 F (36.8 C) (Oral)  Resp 16  Ht 5\' 1"  (1.549 m)  Wt 204 lb (92.534 kg)  BMI 38.57 kg/m2  SpO2 100%  LMP 06/14/2015 Assessment & Plan:  1. HYPERTENSION, BENIGN ESSENTIAL Blood pressure is at goal on current medication regimen. Reviewed urinalysis; no proteinuria present. Will check for acute kidney injury. The patient is asked to make an attempt to improve diet and exercise patterns to aid in medical management of this problem. The patient was given clear instructions to go to ER or return to medical center if symptoms do not improve, worsen or new problems develop. The patient verbalized understanding. Will notify patient with laboratory results. - COMPLETE METABOLIC PANEL WITH GFR - POCT urinalysis dipstick - lisinopril-hydrochlorothiazide (PRINZIDE,ZESTORETIC) 20-25 MG tablet; Take 1 tablet by mouth daily.  Dispense: 30 tablet; Refill: 2  2. Asthma, mild intermittent, uncomplicated - montelukast (SINGULAIR) 10 MG tablet; Take 1 tablet (10 mg total) by mouth at bedtime.  Dispense: 30 tablet; Refill: 3 - albuterol (PROVENTIL HFA;VENTOLIN HFA) 108 (90 BASE) MCG/ACT inhaler; Inhale 2 puffs into the lungs every 6 (six) hours as needed for wheezing or shortness of breath. Asthma attacks  Dispense: 1 each; Refill: 0  3. Arthralgia of both knees Will start a trial of Meloxicam daily for bilateral knee pain. Recommended that patient decrease OTC Ibuprofen.  - meloxicam (MOBIC) 7.5  MG tablet; Take 1 tablet (7.5 mg total) by mouth daily.  Dispense: 30 tablet; Refill: 0  4. Metabolic syndrome Patient increased abdominal girth and a BMI of 38.5, which is consistent with metabolic syndrome. Recommend a lowfat, low carbohydrate diet divided over 5-6 small meals, increase water intake to 6-8 glasses, and 150 minutes per week of cardiovascular exercise.   - Hemoglobin A1c - CBC with Differential - Lipid Panel - TSH  5. Immunization due  - Pneumococcal polysaccharide vaccine 23-valent greater than or equal to 2yo subcutaneous/IM  6. Need for immunization against influenza  - Flu Vaccine QUAD 36+ mos IM (Fluarix)  7. Breast cancer screening - MM DIGITAL SCREENING BILATERAL; Future  8. Routine health maintenance Recommend a screening colonoscopy Recommend screening mammogram Recommend pap smear  RTC. 1 month for hypertension.   Dorena Dew, FNP

## 2015-06-27 NOTE — Patient Instructions (Addendum)
Recommend a lowfat, low carbohydrate diet divided over 5-6 small meals, increase water intake to 6-8 glasses, and 150 minutes per week of cardiovascular exercise.  Will re-start Lisinopril-Hydrochlorothiazide 20-25 daily for hypertension. Return to clinic in 1 month for hypertension.   Will call patient with laboratory results.    Hypertension Hypertension, commonly called high blood pressure, is when the force of blood pumping through your arteries is too strong. Your arteries are the blood vessels that carry blood from your heart throughout your body. A blood pressure reading consists of a higher number over a lower number, such as 110/72. The higher number (systolic) is the pressure inside your arteries when your heart pumps. The lower number (diastolic) is the pressure inside your arteries when your heart relaxes. Ideally you want your blood pressure below 120/80. Hypertension forces your heart to work harder to pump blood. Your arteries may become narrow or stiff. Having untreated or uncontrolled hypertension can cause heart attack, stroke, kidney disease, and other problems. RISK FACTORS Some risk factors for high blood pressure are controllable. Others are not.  Risk factors you cannot control include:   Race. You may be at higher risk if you are African American.  Age. Risk increases with age.  Gender. Men are at higher risk than women before age 30 years. After age 61, women are at higher risk than men. Risk factors you can control include:  Not getting enough exercise or physical activity.  Being overweight.  Getting too much fat, sugar, calories, or salt in your diet.  Drinking too much alcohol. SIGNS AND SYMPTOMS Hypertension does not usually cause signs or symptoms. Extremely high blood pressure (hypertensive crisis) may cause headache, anxiety, shortness of breath, and nosebleed. DIAGNOSIS To check if you have hypertension, your health care provider will measure your blood  pressure while you are seated, with your arm held at the level of your heart. It should be measured at least twice using the same arm. Certain conditions can cause a difference in blood pressure between your right and left arms. A blood pressure reading that is higher than normal on one occasion does not mean that you need treatment. If it is not clear whether you have high blood pressure, you may be asked to return on a different day to have your blood pressure checked again. Or, you may be asked to monitor your blood pressure at home for 1 or more weeks. TREATMENT Treating high blood pressure includes making lifestyle changes and possibly taking medicine. Living a healthy lifestyle can help lower high blood pressure. You may need to change some of your habits. Lifestyle changes may include:  Following the DASH diet. This diet is high in fruits, vegetables, and whole grains. It is low in salt, red meat, and added sugars.  Keep your sodium intake below 2,300 mg per day.  Getting at least 30-45 minutes of aerobic exercise at least 4 times per week.  Losing weight if necessary.  Not smoking.  Limiting alcoholic beverages.  Learning ways to reduce stress. Your health care provider may prescribe medicine if lifestyle changes are not enough to get your blood pressure under control, and if one of the following is true:  You are 27-62 years of age and your systolic blood pressure is above 140.  You are 59 years of age or older, and your systolic blood pressure is above 150.  Your diastolic blood pressure is above 90.  You have diabetes, and your systolic blood pressure is over 140  or your diastolic blood pressure is over 90.  You have kidney disease and your blood pressure is above 140/90.  You have heart disease and your blood pressure is above 140/90. Your personal target blood pressure may vary depending on your medical conditions, your age, and other factors. HOME CARE  INSTRUCTIONS  Have your blood pressure rechecked as directed by your health care provider.   Take medicines only as directed by your health care provider. Follow the directions carefully. Blood pressure medicines must be taken as prescribed. The medicine does not work as well when you skip doses. Skipping doses also puts you at risk for problems.  Do not smoke.   Monitor your blood pressure at home as directed by your health care provider. SEEK MEDICAL CARE IF:   You think you are having a reaction to medicines taken.  You have recurrent headaches or feel dizzy.  You have swelling in your ankles.  You have trouble with your vision. SEEK IMMEDIATE MEDICAL CARE IF:  You develop a severe headache or confusion.  You have unusual weakness, numbness, or feel faint.  You have severe chest or abdominal pain.  You vomit repeatedly.  You have trouble breathing. MAKE SURE YOU:   Understand these instructions.  Will watch your condition.  Will get help right away if you are not doing well or get worse.   This information is not intended to replace advice given to you by your health care provider. Make sure you discuss any questions you have with your health care provider.   Document Released: 08/06/2005 Document Revised: 12/21/2014 Document Reviewed: 05/29/2013 Elsevier Interactive Patient Education 2016 [employer]. Hypertension Hypertension, commonly called high blood pressure, is when the force of blood pumping through your arteries is too strong. Your arteries are the blood vessels that carry blood from your heart throughout your body. A blood pressure reading consists of a higher number over a lower number, such as 110/72. The higher number (systolic) is the pressure inside your arteries when your heart pumps. The lower number (diastolic) is the pressure inside your arteries when your heart relaxes. Ideally you want your blood pressure below 120/80. Hypertension forces your  heart to work harder to pump blood. Your arteries may become narrow or stiff. Having untreated or uncontrolled hypertension can cause heart attack, stroke, kidney disease, and other problems. RISK FACTORS Some risk factors for high blood pressure are controllable. Others are not.  Risk factors you cannot control include:   Race. You may be at higher risk if you are African American.  Age. Risk increases with age.  Gender. Men are at higher risk than women before age 4 years. After age 52, women are at higher risk than men. Risk factors you can control include:  Not getting enough exercise or physical activity.  Being overweight.  Getting too much fat, sugar, calories, or salt in your diet.  Drinking too much alcohol. SIGNS AND SYMPTOMS Hypertension does not usually cause signs or symptoms. Extremely high blood pressure (hypertensive crisis) may cause headache, anxiety, shortness of breath, and nosebleed. DIAGNOSIS To check if you have hypertension, your health care provider will measure your blood pressure while you are seated, with your arm held at the level of your heart. It should be measured at least twice using the same arm. Certain conditions can cause a difference in blood pressure between your right and left arms. A blood pressure reading that is higher than normal on one occasion does not mean that you  need treatment. If it is not clear whether you have high blood pressure, you may be asked to return on a different day to have your blood pressure checked again. Or, you may be asked to monitor your blood pressure at home for 1 or more weeks. TREATMENT Treating high blood pressure includes making lifestyle changes and possibly taking medicine. Living a healthy lifestyle can help lower high blood pressure. You may need to change some of your habits. Lifestyle changes may include:  Following the DASH diet. This diet is high in fruits, vegetables, and whole grains. It is low in salt,  red meat, and added sugars.  Keep your sodium intake below 2,300 mg per day.  Getting at least 30-45 minutes of aerobic exercise at least 4 times per week.  Losing weight if necessary.  Not smoking.  Limiting alcoholic beverages.  Learning ways to reduce stress. Your health care provider may prescribe medicine if lifestyle changes are not enough to get your blood pressure under control, and if one of the following is true:  You are 66-42 years of age and your systolic blood pressure is above 140.  You are 77 years of age or older, and your systolic blood pressure is above 150.  Your diastolic blood pressure is above 90.  You have diabetes, and your systolic blood pressure is over 915 or your diastolic blood pressure is over 90.  You have kidney disease and your blood pressure is above 140/90.  You have heart disease and your blood pressure is above 140/90. Your personal target blood pressure may vary depending on your medical conditions, your age, and other factors. HOME CARE INSTRUCTIONS  Have your blood pressure rechecked as directed by your health care provider.   Take medicines only as directed by your health care provider. Follow the directions carefully. Blood pressure medicines must be taken as prescribed. The medicine does not work as well when you skip doses. Skipping doses also puts you at risk for problems.  Do not smoke.   Monitor your blood pressure at home as directed by your health care provider. SEEK MEDICAL CARE IF:   You think you are having a reaction to medicines taken.  You have recurrent headaches or feel dizzy.  You have swelling in your ankles.  You have trouble with your vision. SEEK IMMEDIATE MEDICAL CARE IF:  You develop a severe headache or confusion.  You have unusual weakness, numbness, or feel faint.  You have severe chest or abdominal pain.  You vomit repeatedly.  You have trouble breathing. MAKE SURE YOU:   Understand these  instructions.  Will watch your condition.  Will get help right away if you are not doing well or get worse.   This information is not intended to replace advice given to you by your health care provider. Make sure you discuss any questions you have with your health care provider.   Document Released: 08/06/2005 Document Revised: 12/21/2014 Document Reviewed: 05/29/2013 Elsevier Interactive Patient Education [employer].

## 2015-06-28 ENCOUNTER — Other Ambulatory Visit: Payer: Self-pay

## 2015-06-28 ENCOUNTER — Other Ambulatory Visit: Payer: Self-pay | Admitting: Family Medicine

## 2015-06-28 ENCOUNTER — Telehealth: Payer: Self-pay | Admitting: Family Medicine

## 2015-06-28 DIAGNOSIS — N921 Excessive and frequent menstruation with irregular cycle: Secondary | ICD-10-CM

## 2015-06-28 DIAGNOSIS — I10 Essential (primary) hypertension: Secondary | ICD-10-CM

## 2015-06-28 DIAGNOSIS — N920 Excessive and frequent menstruation with regular cycle: Secondary | ICD-10-CM | POA: Insufficient documentation

## 2015-06-28 DIAGNOSIS — D649 Anemia, unspecified: Secondary | ICD-10-CM

## 2015-06-28 DIAGNOSIS — D62 Acute posthemorrhagic anemia: Secondary | ICD-10-CM

## 2015-06-28 LAB — HEMOGLOBIN A1C
Hgb A1c MFr Bld: 5.1 % (ref <5.7)
MEAN PLASMA GLUCOSE: 100 mg/dL (ref <117)

## 2015-06-28 MED ORDER — LISINOPRIL-HYDROCHLOROTHIAZIDE 20-25 MG PO TABS: 1.0000 | ORAL_TABLET | Freq: Every day | ORAL | Status: DC

## 2015-06-28 NOTE — Telephone Encounter (Signed)
Reviewed labs, hemoglobin is 6.8. Caroline Ryan denies frank bleeding, blood in stool or urine. She does report occasionally having heavy menstrual cycles. We will have patient follow up on 06/29/2015 for a repeat hemoglobin and hematocrit and type and screen. Will transfuse 1 unit of packed red blood cells.   Will also send referral to gynecologist for menorrhagia with irregular menstrual cycles.   Dorena Dew, FNP

## 2015-06-29 LAB — IRON AND TIBC
%SAT: 2 % — ABNORMAL LOW (ref 11–50)
IRON: 11 ug/dL — AB (ref 45–160)
TIBC: 456 ug/dL — ABNORMAL HIGH (ref 250–450)
UIBC: 445 ug/dL — ABNORMAL HIGH (ref 125–400)

## 2015-06-29 LAB — FERRITIN: Ferritin: 3 ng/mL — ABNORMAL LOW (ref 10–291)

## 2015-07-27 ENCOUNTER — Ambulatory Visit: Payer: Self-pay | Admitting: Family Medicine

## 2015-08-19 IMAGING — US US TRANSVAGINAL NON-OB
1 series · 13 of 25 positions shown · non-contrast
Comparison: None in PACs

CLINICAL DATA: Excessive bleeding in the perimenopausal.

EXAM:
TRANSABDOMINAL AND TRANSVAGINAL ULTRASOUND OF PELVIS
TECHNIQUE: Both transabdominal and transvaginal ultrasound examinations of the
pelvis were performed. Transabdominal technique was performed for
global imaging of the pelvis including uterus, ovaries, adnexal
regions, and pelvic cul-de-sac. It was necessary to proceed with
endovaginal exam following the transabdominal exam to visualize the
uterus, endometrium, ovaries, and adnexal regions..

[Series 1: us transvaginal non-ob · 0.24mm/px · 13 of 96 slices shown]
[im 1/96]
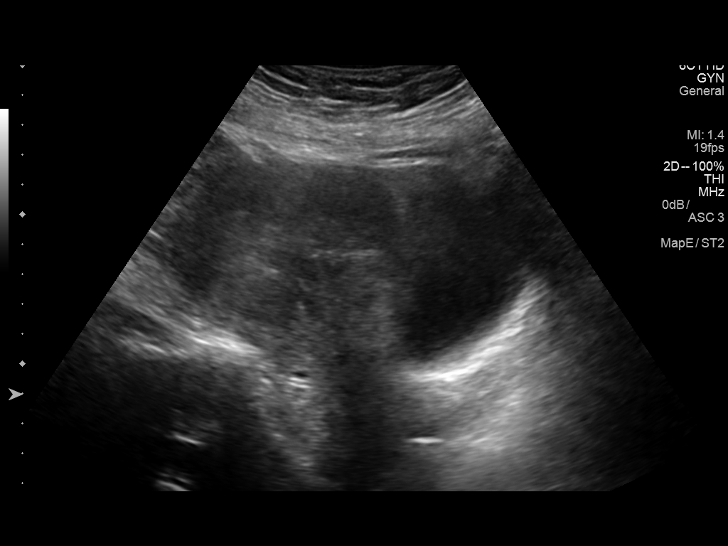
[im 8/96]
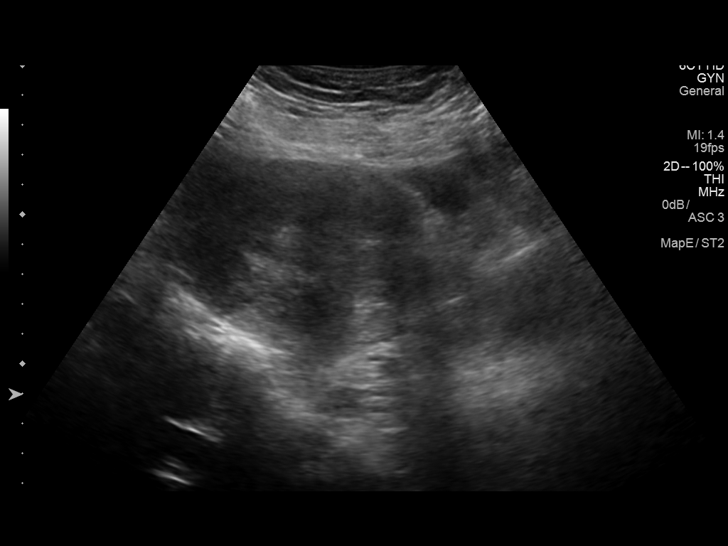
[im 16/96]
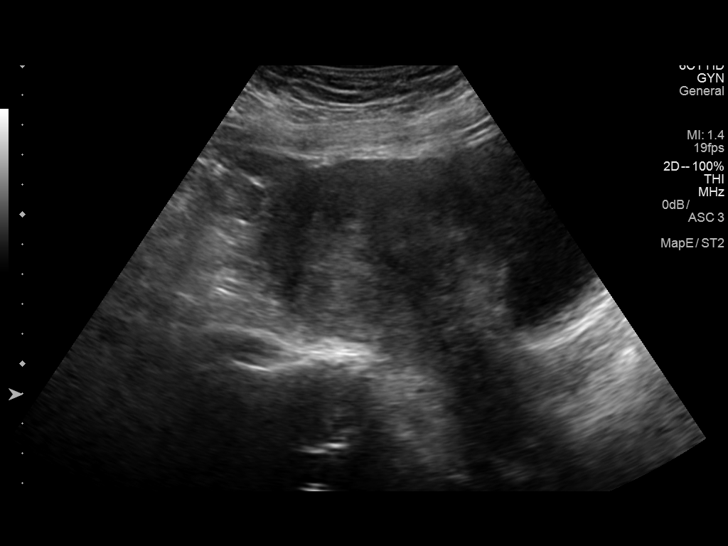
[im 24/96]
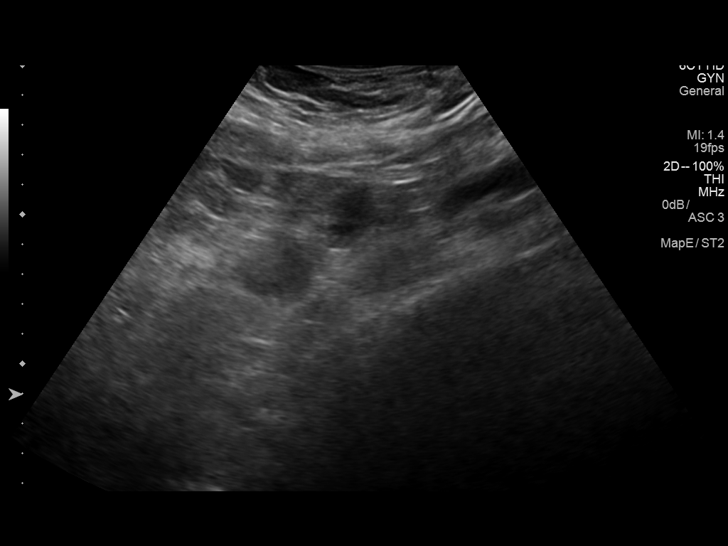
[im 32/96]
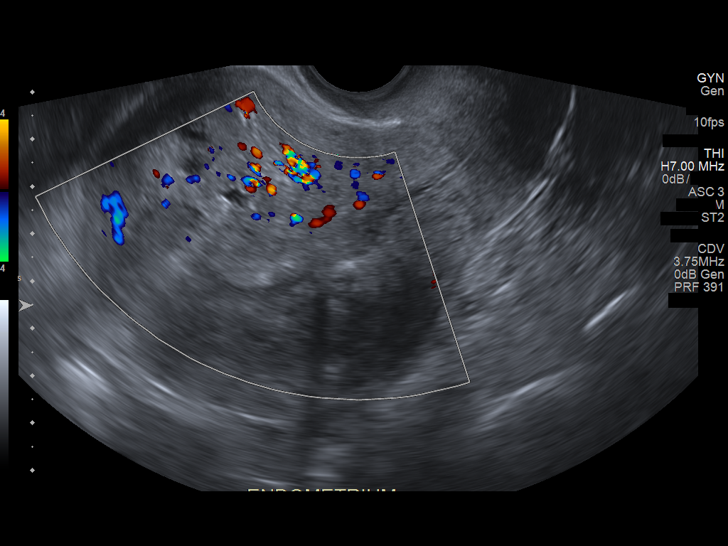
[im 40/96]
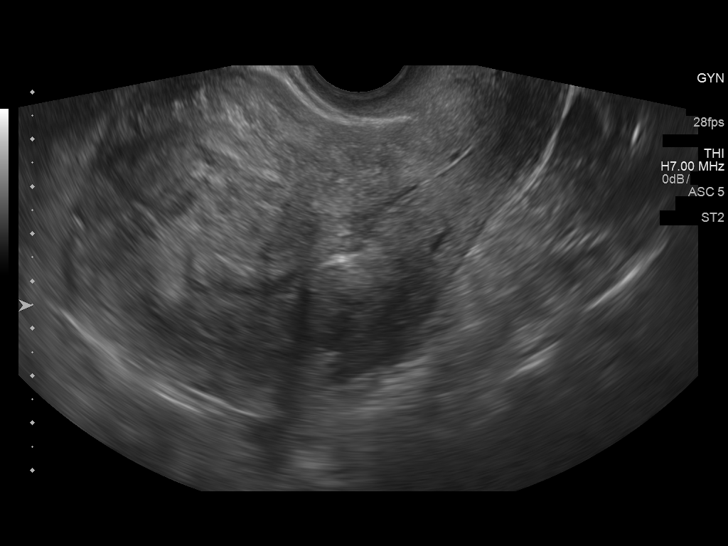
[im 48/96]
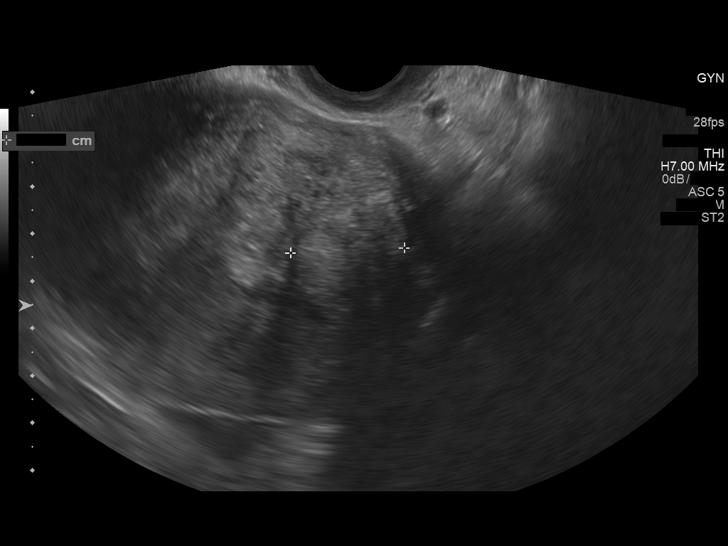
[im 56/96]
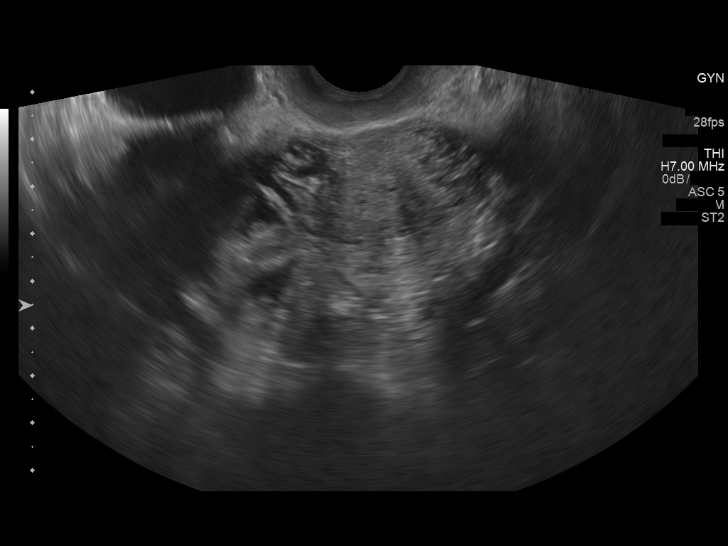
[im 64/96]
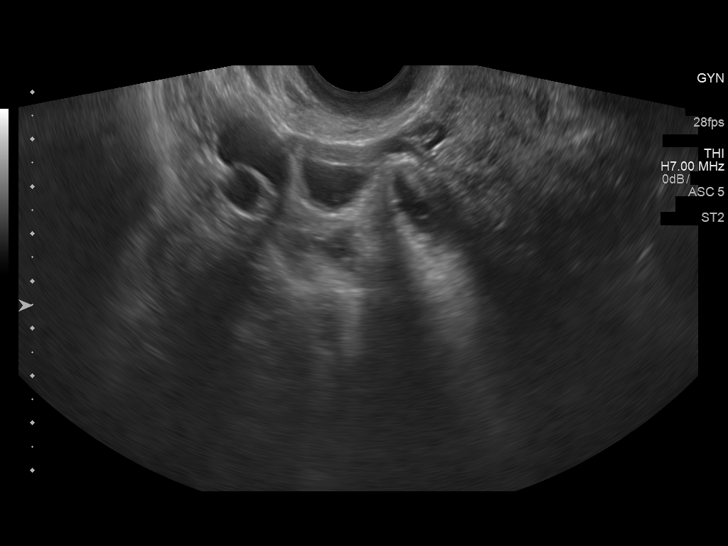
[im 72/96]
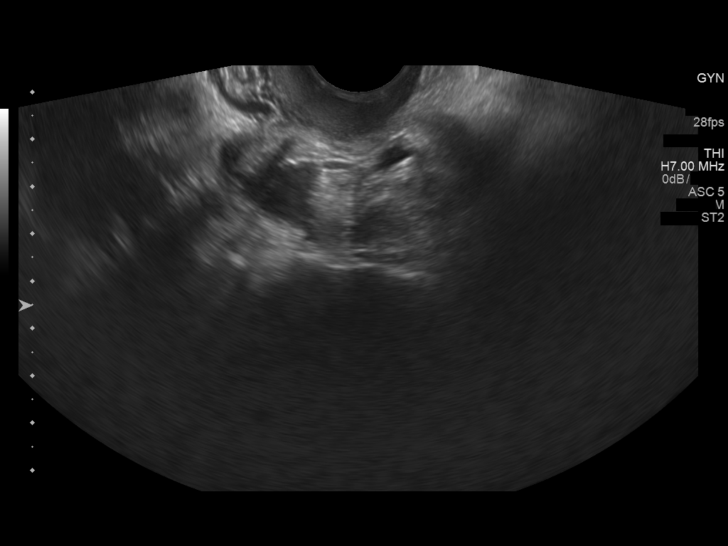
[im 80/96]
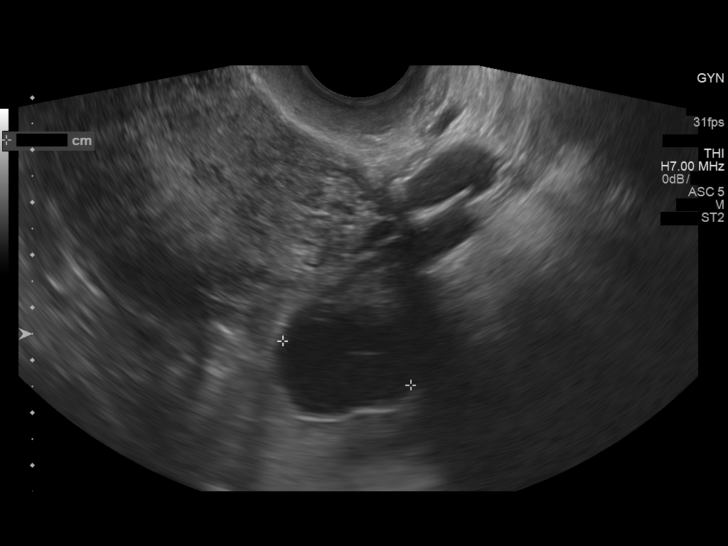
[im 88/96]
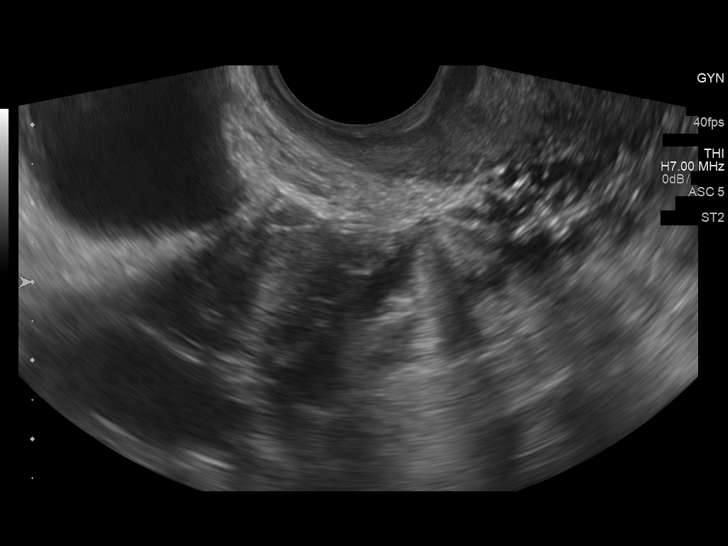
[im 96/96]
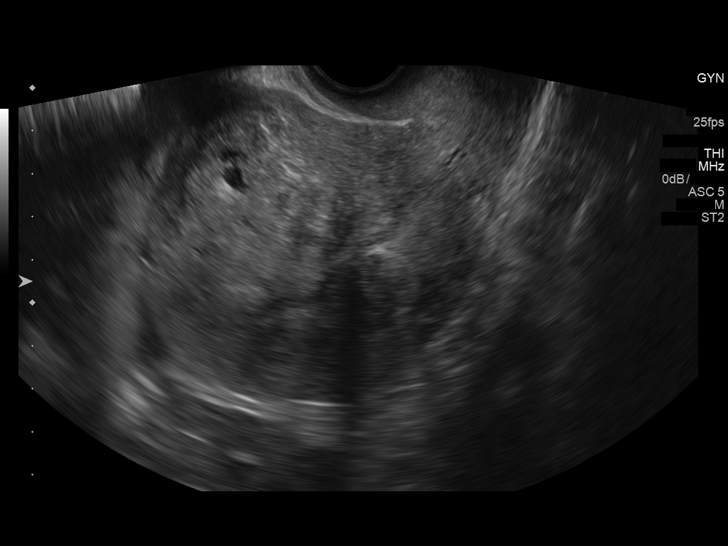

[13 of 25 positions shown; findings below may reference images not displayed]

FINDINGS: Uterus

Measurements: 11.3 x 7.4 x 8.3 cm. There are multiple uterine
fibroids. In the anterior aspect of the fundus there is a 3 cm
diameter fibroid. In the left aspect of the fundus there is a 2.4 cm
diameter fibroid. In the posterior aspect of the mid uterine corpus
there is a 3.4 cm diameter fibroid.

Endometrium

Thickness: 10.6 mm.  No focal abnormality visualized.

Right ovary

Measurements: 2.0 x 1.3 x 1.5 cm. Normal appearance/no adnexal mass.

Left ovary

Measurements: 3.3 x 2.6 x 2.6 cm.. There is a cyst associated with
the left ovary which measures 1.9 x 2.3 x 2.6 cm. A few internal
echoes are visible.

Other findings

No abnormal free fluid.
IMPRESSION: 1. Multiple uterine fibroids. No endometrial masses. If bleeding
remains unresponsive to hormonal or medical therapy, sonohysterogram
should be considered for focal lesion work-up. (Ref: Radiological
Reasoning: Algorithmic Workup of Abnormal Vaginal Bleeding with
Endovaginal Sonography and Sonohysterography. AJR [KD]; [id number])
2. Non simple cyst in the left ovary measuring up to 2.6 cm in
diameter. Given the perimenopausal status and the patient's
menorrhagia, follow-up ultrasound in 8-12 weeks is recommended to
re- assess this cystic structure.

## 2015-08-26 ENCOUNTER — Ambulatory Visit: Payer: Self-pay | Admitting: Family Medicine

## 2015-09-21 ENCOUNTER — Other Ambulatory Visit: Payer: Self-pay | Admitting: Family Medicine

## 2015-09-21 ENCOUNTER — Ambulatory Visit (INDEPENDENT_AMBULATORY_CARE_PROVIDER_SITE_OTHER): Payer: Self-pay | Admitting: Family Medicine

## 2015-09-21 ENCOUNTER — Encounter: Payer: Self-pay | Admitting: Family Medicine

## 2015-09-21 VITALS — BP 158/82 | HR 84 | Temp 97.9°F | Resp 14 | Ht 61.0 in | Wt 208.0 lb

## 2015-09-21 DIAGNOSIS — N924 Excessive bleeding in the premenopausal period: Secondary | ICD-10-CM

## 2015-09-21 DIAGNOSIS — M25562 Pain in left knee: Secondary | ICD-10-CM

## 2015-09-21 DIAGNOSIS — M25561 Pain in right knee: Secondary | ICD-10-CM

## 2015-09-21 DIAGNOSIS — I10 Essential (primary) hypertension: Secondary | ICD-10-CM

## 2015-09-21 LAB — CBC WITH DIFFERENTIAL/PLATELET
BASOS ABS: 0.1 10*3/uL (ref 0.0–0.1)
Basophils Relative: 1 % (ref 0–1)
Eosinophils Absolute: 0.1 10*3/uL (ref 0.0–0.7)
Eosinophils Relative: 1 % (ref 0–5)
HEMATOCRIT: 25.8 % — AB (ref 36.0–46.0)
HEMOGLOBIN: 7.2 g/dL — AB (ref 12.0–15.0)
LYMPHS ABS: 2.1 10*3/uL (ref 0.7–4.0)
Lymphocytes Relative: 31 % (ref 12–46)
MCH: 20.9 pg — ABNORMAL LOW (ref 26.0–34.0)
MCHC: 27.9 g/dL — ABNORMAL LOW (ref 30.0–36.0)
MCV: 75 fL — AB (ref 78.0–100.0)
Monocytes Absolute: 0.5 10*3/uL (ref 0.1–1.0)
Monocytes Relative: 7 % (ref 3–12)
NEUTROS PCT: 60 % (ref 43–77)
Neutro Abs: 4 10*3/uL (ref 1.7–7.7)
Platelets: 112 10*3/uL — ABNORMAL LOW (ref 150–400)
RBC: 3.44 MIL/uL — ABNORMAL LOW (ref 3.87–5.11)
RDW: 22.8 % — ABNORMAL HIGH (ref 11.5–15.5)
WBC: 6.7 10*3/uL (ref 4.0–10.5)

## 2015-09-21 LAB — BASIC METABOLIC PANEL WITH GFR
BUN: 10 mg/dL (ref 7–25)
CHLORIDE: 106 mmol/L (ref 98–110)
CO2: 23 mmol/L (ref 20–31)
CREATININE: 0.56 mg/dL (ref 0.50–1.05)
Calcium: 8.5 mg/dL — ABNORMAL LOW (ref 8.6–10.4)
GFR, Est African American: >89 mL/min (ref >=60)
GFR, Est Non African American: >89 mL/min (ref >=60)
Glucose, Bld: 86 mg/dL (ref 65–99)
Potassium: 3.6 mmol/L (ref 3.5–5.3)
SODIUM: 138 mmol/L (ref 135–146)

## 2015-09-21 MED ORDER — DICLOFENAC SODIUM 50 MG PO TBEC: 50.0000 mg | DELAYED_RELEASE_TABLET | Freq: Two times a day (BID) | ORAL | Status: DC

## 2015-09-21 MED ORDER — LISINOPRIL-HYDROCHLOROTHIAZIDE 20-25 MG PO TABS: 1.0000 | ORAL_TABLET | Freq: Every day | ORAL | Status: DC

## 2015-09-21 NOTE — Progress Notes (Signed)
4  Subjective:    Patient ID: Caroline Ryan, female    DOB: [birth year], 50 y.o.   MRN: [id number]  HPI  [name], a 50 year old female presents to establish care. She reports a history of hypertension, asthma, and bilateral knee pain. She reports that she has been out of antihypertensive medications over the past several weeks. She states that she has not been exercising or following a lowfat, low sodium diet. She reports that she was previously taking Lisinopril-Hydrochlorothiazide 20-25 mg daily. She currently denies dizziness, fatigue, syncope, chest pains, heart palpitations, or swelling of lower extremities.   Caroline Ryan is complaining of bilateral knee pain. Onset of the symptoms was several months ago. Patient has not identified an inciting event.  Current symptoms include stiffness, swelling and pain. Pain is aggravated by any weight bearing, going up and down stairs, lateral movements and pivoting.  Patient has had no prior knee problems. Patient denies having evaluation or treatment to date. She reports that pain intensity is 8/10 described as intermittent and aching. She last had Ibuprofen 800 mg this am with minimal relief.   Patient is also complaining of heavy menstrual cycles. She states that she has been using 4-5 pads per day along with tampons. She denies fatigue, fever, abdominal pains, cramping or hot flashes.   Past Medical History  Diagnosis Date  . Headache(784.0)   . Infection   . Asthma   . Urinary tract infection   . Fibroid   . Hypertension    Social History   Social History  . Marital Status: Single    Spouse Name: N/A  . Number of Children: N/A  . Years of Education: N/A   Occupational History  . Not on file.   Social History Main Topics  . Smoking status: Former Research scientist (life sciences)  . Smokeless tobacco: Not on file     Comment: as teenager  . Alcohol Use: No  . Drug Use: No  . Sexual Activity: Yes    Birth Control/ Protection: Condom   Other Topics  Concern  . Not on file   Social History Narrative   Immunization History  Administered Date(s) Administered  . Influenza Whole 06/30/2009  . Influenza,inj,Quad PF,36+ Mos 06/27/2015  . Pneumococcal Polysaccharide-23 04/14/2008, 06/27/2015  . Td 10/18/2005   Review of Systems  Constitutional: Negative for fever and fatigue.  HENT: Positive for dental problem.   Eyes: Negative for photophobia and visual disturbance.  Cardiovascular: Negative.   Gastrointestinal: Negative.   Endocrine: Negative for polydipsia, polyphagia and polyuria.  Genitourinary: Negative.  Negative for vaginal discharge and vaginal pain.  Musculoskeletal: Positive for myalgias (bilateral knee pain).  Skin: Negative.   Neurological: Negative.  Negative for dizziness, tremors, weakness and numbness.  Hematological: Negative.   Psychiatric/Behavioral: Negative for suicidal ideas and sleep disturbance.       Objective:   Physical Exam  Constitutional: She is oriented to person, place, and time. She appears well-developed and well-nourished.  HENT:  Head: Normocephalic and atraumatic.  Right Ear: Hearing, tympanic membrane, external ear and ear canal normal.  Left Ear: Hearing, tympanic membrane, external ear and ear canal normal.  Nose: Nose normal.  Mouth/Throat: Abnormal dentition. Dental caries present.  Eyes: Conjunctivae and EOM are normal. Pupils are equal, round, and reactive to light.  Neck: Normal range of motion. Neck supple.  Cardiovascular: Normal rate, regular rhythm, normal heart sounds and intact distal pulses.   Pulmonary/Chest: Effort normal and breath sounds normal.  Abdominal: Soft. Bowel  sounds are normal.  Musculoskeletal:       Right knee: She exhibits decreased range of motion. She exhibits no erythema.       Left knee: She exhibits decreased range of motion. She exhibits no swelling, no ecchymosis and no erythema.  Neurological: She is alert and oriented to person, place, and time.  She has normal reflexes.  Skin: Skin is warm and dry.  Psychiatric: She has a normal mood and affect. Her behavior is normal. Judgment and thought content normal.      BP 158/82 mmHg  Pulse 84  Temp(Src) 97.9 F (36.6 C) (Oral)  Resp 14  Ht 5\' 1"  (1.549 m)  Wt 208 lb (94.348 kg)  BMI 39.32 kg/m2  LMP 09/19/2015 Assessment & Plan:  1. HYPERTENSION, BENIGN ESSENTIAL Blood pressure is at goal on current medication regimen. Reviewed urinalysis; no proteinuria present. Will check for acute kidney injury. The patient is asked to make an attempt to improve diet and exercise patterns to aid in medical management of this problem. - lisinopril-hydrochlorothiazide (PRINZIDE,ZESTORETIC) 20-25 MG tablet; Take 1 tablet by mouth daily.  Dispense: 30 tablet; Refill: 2 - BASIC METABOLIC PANEL WITH GFR - POCT urinalysis dipstick  2. Excessive bleeding in premenopausal period Patient may warrant a pelvic and transvaginal ultrasound for heavy menstrual periods. Will check CBC due to increased blood loss.  - CBC with Differential  3. Arthralgia of both knees - diclofenac (VOLTAREN) 50 MG EC tablet; Take 1 tablet (50 mg total) by mouth 2 (two) times daily.  Dispense: 60 tablet; Refill: 0  RTC. 3 months for hypertension. We will also schedule a pap smear during f/u visit.     Dorena Dew, FNP

## 2015-09-21 NOTE — Patient Instructions (Signed)
DASH Eating Plan DASH stands for "Dietary Approaches to Stop Hypertension." The DASH eating plan is a healthy eating plan that has been shown to reduce high blood pressure (hypertension). Additional health benefits may include reducing the risk of type 2 diabetes mellitus, heart disease, and stroke. The DASH eating plan may also help with weight loss. WHAT DO I NEED TO KNOW ABOUT THE DASH EATING PLAN? For the DASH eating plan, you will follow these general guidelines:  Choose foods with a percent daily value for sodium of less than 5% (as listed on the food label).  Use salt-free seasonings or herbs instead of table salt or sea salt.  Check with your health care provider or pharmacist before using salt substitutes.  Eat lower-sodium products, often labeled as "lower sodium" or "no salt added."  Eat fresh foods.  Eat more vegetables, fruits, and low-fat dairy products.  Choose whole grains. Look for the word "whole" as the first word in the ingredient list.  Choose fish and skinless chicken or turkey more often than red meat. Limit fish, poultry, and meat to 6 oz (170 g) each day.  Limit sweets, desserts, sugars, and sugary drinks.  Choose heart-healthy fats.  Limit cheese to 1 oz (28 g) per day.  Eat more home-cooked food and less restaurant, buffet, and fast food.  Limit fried foods.  Cook foods using methods other than frying.  Limit canned vegetables. If you do use them, rinse them well to decrease the sodium.  When eating at a restaurant, ask that your food be prepared with less salt, or no salt if possible. WHAT FOODS CAN I EAT? Seek help from a dietitian for individual calorie needs. Grains Whole grain or whole wheat bread. Brown rice. Whole grain or whole wheat pasta. Quinoa, bulgur, and whole grain cereals. Low-sodium cereals. Corn or whole wheat flour tortillas. Whole grain cornbread. Whole grain crackers. Low-sodium crackers. Vegetables Fresh or frozen vegetables  (raw, steamed, roasted, or grilled). Low-sodium or reduced-sodium tomato and vegetable juices. Low-sodium or reduced-sodium tomato sauce and paste. Low-sodium or reduced-sodium canned vegetables.  Fruits All fresh, canned (in natural juice), or frozen fruits. Meat and Other Protein Products Ground beef (85% or leaner), grass-fed beef, or beef trimmed of fat. Skinless chicken or turkey. Ground chicken or turkey. Pork trimmed of fat. All fish and seafood. Eggs. Dried beans, peas, or lentils. Unsalted nuts and seeds. Unsalted canned beans. Dairy Low-fat dairy products, such as skim or 1% milk, 2% or reduced-fat cheeses, low-fat ricotta or cottage cheese, or plain low-fat yogurt. Low-sodium or reduced-sodium cheeses. Fats and Oils Tub margarines without trans fats. Light or reduced-fat mayonnaise and salad dressings (reduced sodium). Avocado. Safflower, olive, or canola oils. Natural peanut or almond butter. Other Unsalted popcorn and pretzels. The items listed above may not be a complete list of recommended foods or beverages. Contact your dietitian for more options. WHAT FOODS ARE NOT RECOMMENDED? Grains White bread. White pasta. White rice. Refined cornbread. Bagels and croissants. Crackers that contain trans fat. Vegetables Creamed or fried vegetables. Vegetables in a cheese sauce. Regular canned vegetables. Regular canned tomato sauce and paste. Regular tomato and vegetable juices. Fruits Dried fruits. Canned fruit in light or heavy syrup. Fruit juice. Meat and Other Protein Products Fatty cuts of meat. Ribs, chicken wings, bacon, sausage, bologna, salami, chitterlings, fatback, hot dogs, bratwurst, and packaged luncheon meats. Salted nuts and seeds. Canned beans with salt. Dairy Whole or 2% milk, cream, half-and-half, and cream cheese. Whole-fat or sweetened yogurt. Full-fat   cheeses or blue cheese. Nondairy creamers and whipped toppings. Processed cheese, cheese spreads, or cheese  curds. Condiments Onion and garlic salt, seasoned salt, table salt, and sea salt. Canned and packaged gravies. Worcestershire sauce. Tartar sauce. Barbecue sauce. Teriyaki sauce. Soy sauce, including reduced sodium. Steak sauce. Fish sauce. Oyster sauce. Cocktail sauce. Horseradish. Ketchup and mustard. Meat flavorings and tenderizers. Bouillon cubes. Hot sauce. Tabasco sauce. Marinades. Taco seasonings. Relishes. Fats and Oils Butter, stick margarine, lard, shortening, ghee, and bacon fat. Coconut, palm kernel, or palm oils. Regular salad dressings. Other Pickles and olives. Salted popcorn and pretzels. The items listed above may not be a complete list of foods and beverages to avoid. Contact your dietitian for more information. WHERE CAN I FIND MORE INFORMATION? National Heart, Lung, and Blood Institute: travelstabloid.com   This information is not intended to replace advice given to you by your health care provider. Make sure you discuss any questions you have with your health care provider.   Document Released: 07/26/2011 Document Revised: 08/27/2014 Document Reviewed: 06/10/2013 Elsevier Interactive Patient Education 2016 Else[employer] Inc[employer] Postmenopausal Bleeding Postmenopausal bleeding is any bleeding a woman has after she has entered into menopause. Menopause is the end of a woman's fertile years. After menopause, a woman no longer ovulates or has menstrual periods.  Postmenopausal bleeding can be caused by various things. Any type of postmenopausal bleeding, even if it appears to be a typical menstrual period, is concerning. This should be evaluated by your health care provider. Any treatment will depend on the cause of the bleeding. HOME CARE INSTRUCTIONS Monitor your condition for any changes. The following actions may help to alleviate any discomfort you are experiencing:  Avoid the use of tampons and douches as directed by your health care  provider.  Change your pads frequently.  Get regular pelvic exams and Pap tests.  Keep all follow-up appointments for diagnostic tests as directed by your health care provider. SEEK MEDICAL CARE IF:   Your bleeding lasts more than 1 week.  You have abdominal pain.  You have bleeding with sexual intercourse. SEEK IMMEDIATE MEDICAL CARE IF:   You have a fever, chills, headache, dizziness, muscle aches, and bleeding.  You have severe pain with bleeding.  You are passing blood clots.  You have bleeding and need more than 1 pad an hour.  You feel faint. MAKE SURE YOU:  Understand these instructions.  Will watch your condition.  Will get help right away if you are not doing well or get worse.   This information is not intended to replace advice given to you by your health care provider. Make sure you discuss any questions you have with your health care provider.   Document Released: 11/14/2005 Document Revised: 05/27/2013 Document Reviewed: 03/05/2013 Elsevier Interactive Patient Education 2016 Elsevier Inc. Postmenopausal Bleeding Postmenopausal bleeding is any bleeding a woman has after she has entered into menopause. Menopause is the end of a woman's fertile years. After menopause, a woman no longer ovulates or has menstrual periods.  Postmenopausal bleeding can be caused by various things. Any type of postmenopausal bleeding, even if it appears to be a typical menstrual period, is concerning. This should be evaluated by your health care provider. Any treatment will depend on the cause of the bleeding. HOME CARE INSTRUCTIONS Monitor your condition for any changes. The following actions may help to alleviate any discomfort you are experiencing:  Avoid the use of tampons and douches as directed by your health care provider.  Change your pads  frequently.  Get regular pelvic exams and Pap tests.  Keep all follow-up appointments for diagnostic tests as directed by your  health care provider. SEEK MEDICAL CARE IF:   Your bleeding lasts more than 1 week.  You have abdominal pain.  You have bleeding with sexual intercourse. SEEK IMMEDIATE MEDICAL CARE IF:   You have a fever, chills, headache, dizziness, muscle aches, and bleeding.  You have severe pain with bleeding.  You are passing blood clots.  You have bleeding and need more than 1 pad an hour.  You feel faint. MAKE SURE YOU:  Understand these instructions.  Will watch your condition.  Will get help right away if you are not doing well or get worse.   This information is not intended to replace advice given to you by your health care provider. Make sure you discuss any questions you have with your health care provider.   Document Released: 11/14/2005 Document Revised: 05/27/2013 Document Reviewed: 03/05/2013 Elsevier Interactive Patient Education 2016 Elsevier Inc. Knee Pain Knee pain is a common problem. It can have many causes. The pain often goes away by following your doctor's home care instructions. Treatment for ongoing pain will depend on the cause of your pain. If your knee pain continues, more tests may be needed to diagnose your condition. Tests may include X-rays or other imaging studies of your knee. HOME CARE  Take medicines only as told by your doctor.  Rest your knee and keep it raised (elevated) while you are resting.  Do not do things that cause pain or make your pain worse.  Avoid activities where both feet leave the ground at the same time, such as running, jumping rope, or doing jumping jacks.  Apply ice to the knee area:  Put ice in a plastic bag.  Place a towel between your skin and the bag.  Leave the ice on for 20 minutes, 2-3 times a day.  Ask your doctor if you should wear an elastic knee support.  Sleep with a pillow under your knee.  Lose weight if you are overweight. Being overweight can make your knee hurt more.  Do not use any tobacco  products, including cigarettes, chewing tobacco, or electronic cigarettes. If you need help quitting, ask your doctor. Smoking may slow the healing of any bone and joint problems that you may have. GET HELP IF:  Your knee pain does not stop, it changes, or it gets worse.  You have a fever along with knee pain.  Your knee gives out or locks up.  Your knee becomes more swollen. GET HELP RIGHT AWAY IF:   Your knee feels hot to the touch.  You have chest pain or trouble breathing.   This information is not intended to replace advice given to you by your health care provider. Make sure you discuss any questions you have with your health care provider.   Document Released: 11/02/2008 Document Revised: 08/27/2014 Document Reviewed: 10/07/2013 Elsevier Interactive Patient Education [employer].

## 2015-09-22 ENCOUNTER — Other Ambulatory Visit: Payer: Self-pay | Admitting: Family Medicine

## 2015-09-22 DIAGNOSIS — D649 Anemia, unspecified: Secondary | ICD-10-CM

## 2015-09-22 DIAGNOSIS — N921 Excessive and frequent menstruation with irregular cycle: Secondary | ICD-10-CM

## 2015-09-22 LAB — IRON AND TIBC
%SAT: 4 % — ABNORMAL LOW (ref 11–50)
IRON: 16 ug/dL — AB (ref 45–160)
TIBC: 429 ug/dL (ref 250–450)
UIBC: 413 ug/dL — AB (ref 125–400)

## 2015-09-22 LAB — RETICULOCYTES
ABS RETIC: 65.6 10*3/uL (ref 19.0–186.0)
RBC.: 3.45 MIL/uL — ABNORMAL LOW (ref 3.87–5.11)
Retic Ct Pct: 1.9 % (ref 0.4–2.3)

## 2015-09-22 LAB — FERRITIN: Ferritin: 5 ng/mL — ABNORMAL LOW (ref 10–291)

## 2015-09-22 MED ORDER — FERROUS SULFATE 325 (65 FE) MG PO TABS: 325.0000 mg | ORAL_TABLET | Freq: Every day | ORAL | Status: DC

## 2015-10-03 ENCOUNTER — Ambulatory Visit (HOSPITAL_COMMUNITY): Admission: RE | Admit: 2015-10-03 | Payer: No Typology Code available for payment source | Source: Ambulatory Visit

## 2015-10-10 ENCOUNTER — Ambulatory Visit
Admission: RE | Admit: 2015-10-10 | Discharge: 2015-10-10 | Disposition: A | Discharge status: Home / Self Care | Payer: No Typology Code available for payment source | Source: Ambulatory Visit | Attending: Family Medicine | Admitting: Family Medicine

## 2015-10-10 DIAGNOSIS — Z1239 Encounter for other screening for malignant neoplasm of breast: Secondary | ICD-10-CM

## 2015-10-10 IMAGING — MG MM SCREEN MAMMOGRAM BILATERAL
4 series · 4 of 4 positions shown · non-contrast
Comparison: Previous exam(s).

CLINICAL DATA: Screening.

EXAM:
DIGITAL SCREENING BILATERAL MAMMOGRAM WITH CAD

[R CC]
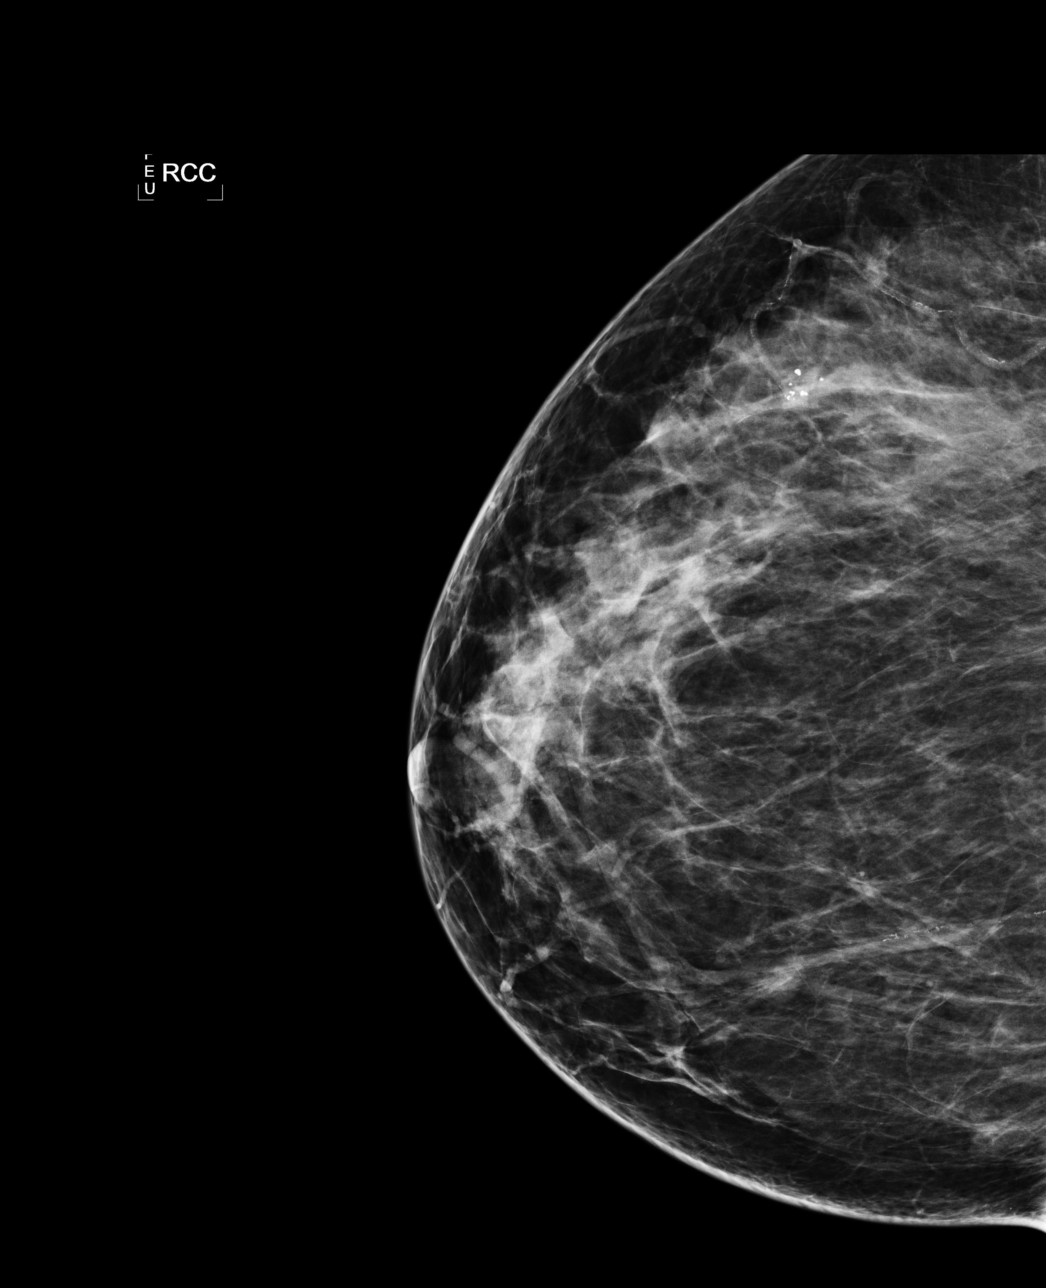

[L CC]
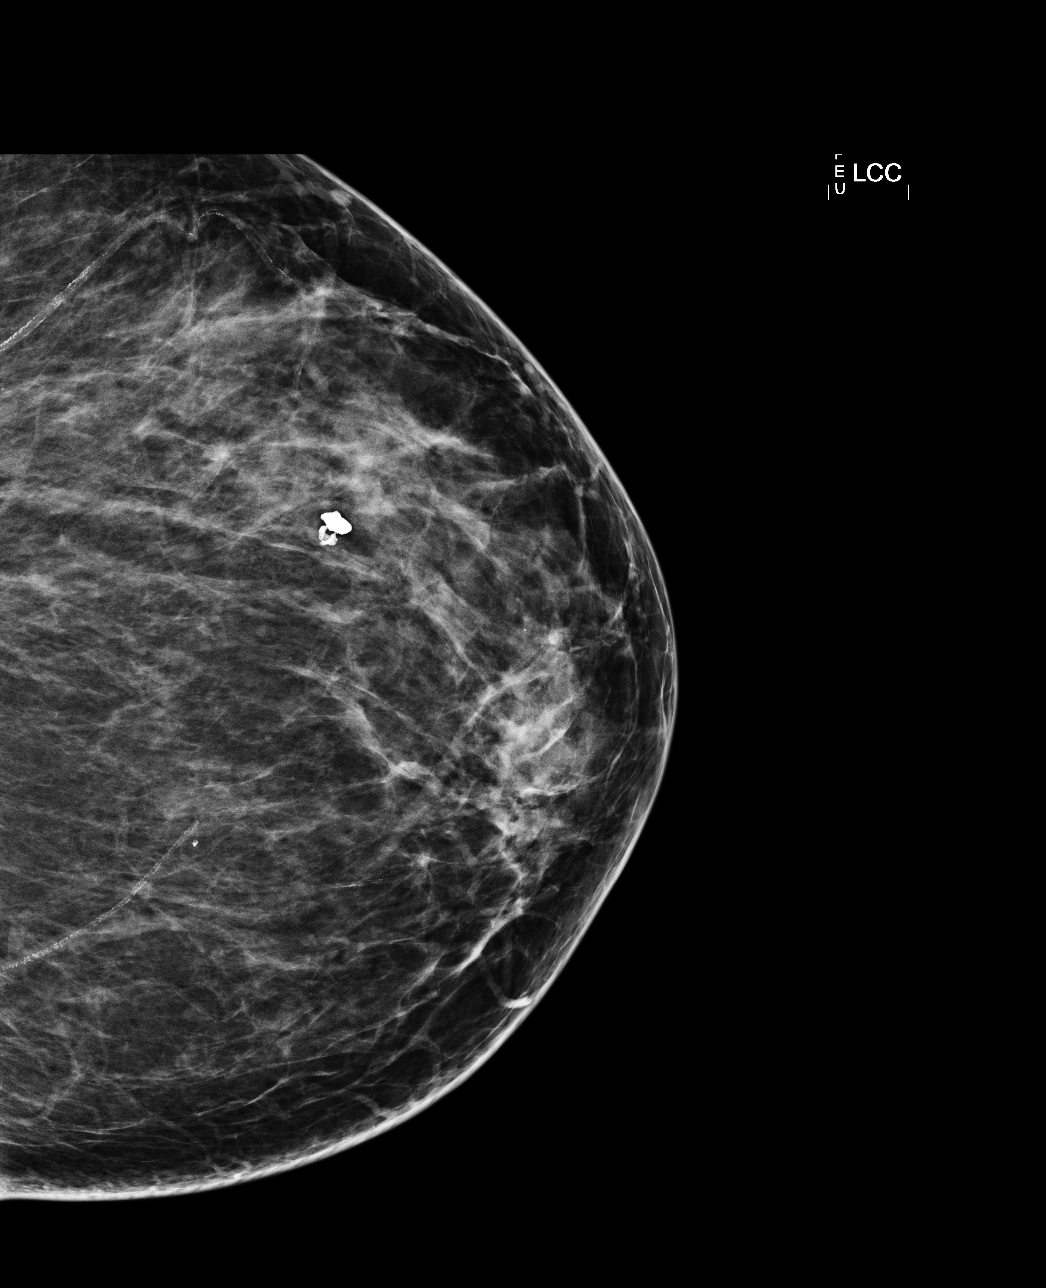

[L MLO]
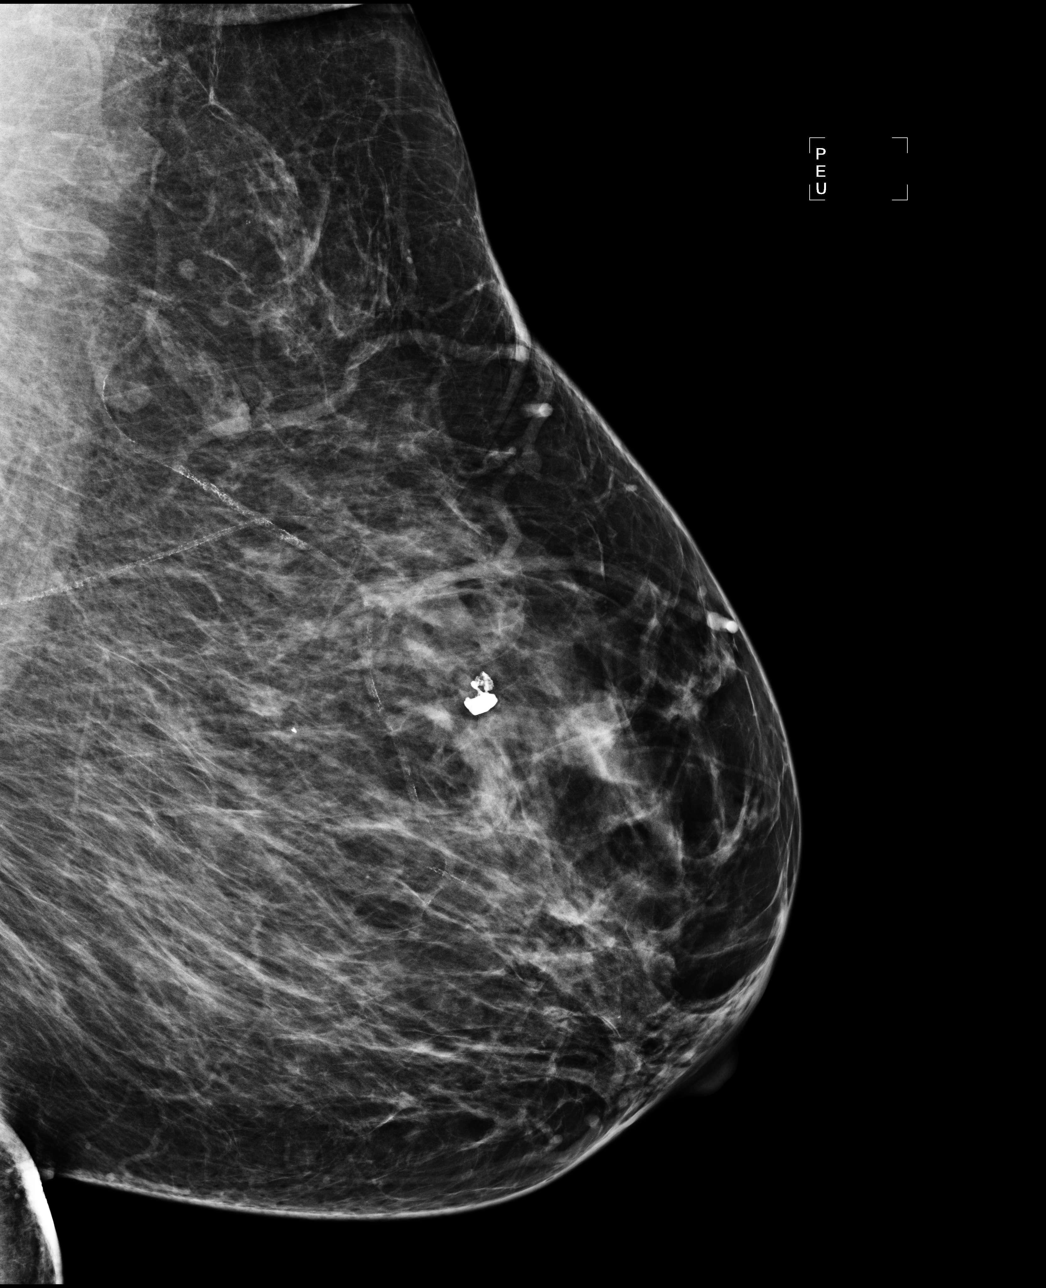

[R MLO]
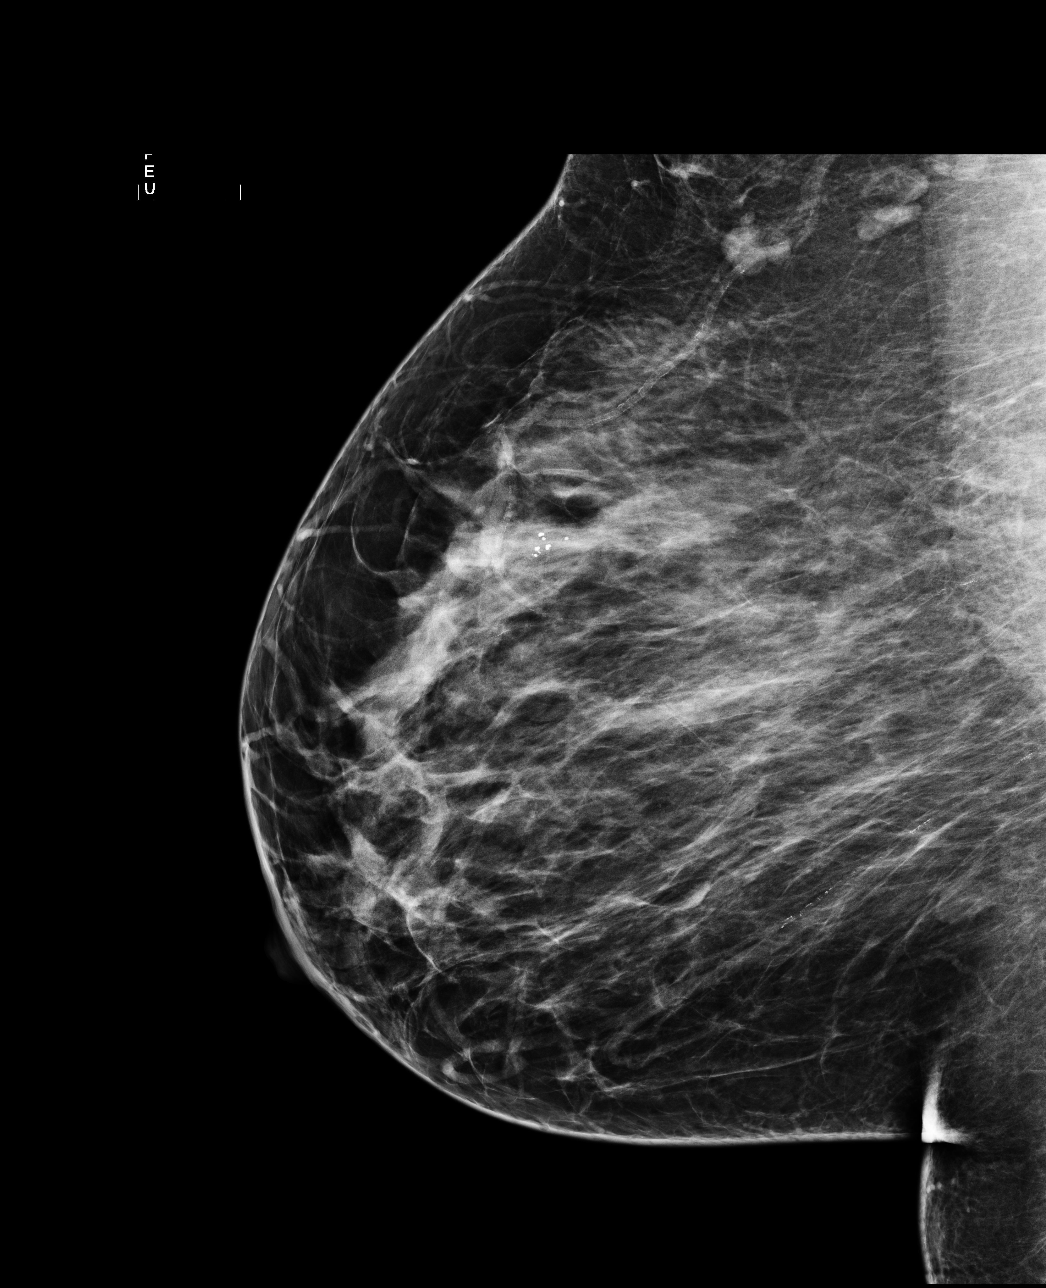

[4 of 4 positions shown; findings below may reference images not displayed]

ACR Breast Density Category b: There are scattered areas of
fibroglandular density.
FINDINGS: There are no findings suspicious for malignancy. Images were
processed with CAD.
IMPRESSION: No mammographic evidence of malignancy. A result letter of this
screening mammogram will be mailed directly to the patient.

RECOMMENDATION:
Screening mammogram in one year. (Code:[US])

BI-RADS CATEGORY  1: Negative.

## 2015-12-21 ENCOUNTER — Ambulatory Visit: Payer: Self-pay | Admitting: Family Medicine

## 2015-12-30 ENCOUNTER — Ambulatory Visit: Payer: Self-pay | Admitting: Family Medicine

## 2015-12-30 ENCOUNTER — Other Ambulatory Visit: Payer: Self-pay

## 2015-12-30 DIAGNOSIS — I10 Essential (primary) hypertension: Secondary | ICD-10-CM

## 2015-12-30 MED ORDER — LISINOPRIL-HYDROCHLOROTHIAZIDE 20-25 MG PO TABS: 1.0000 | ORAL_TABLET | Freq: Every day | ORAL | Status: DC

## 2015-12-30 NOTE — Telephone Encounter (Signed)
Refill for bp meds sent into pharmacy. Thanks!

## 2016-01-20 ENCOUNTER — Ambulatory Visit: Payer: Self-pay | Admitting: Family Medicine

## 2016-02-16 ENCOUNTER — Other Ambulatory Visit: Payer: Self-pay

## 2016-02-16 ENCOUNTER — Ambulatory Visit (INDEPENDENT_AMBULATORY_CARE_PROVIDER_SITE_OTHER): Payer: Self-pay | Admitting: Family Medicine

## 2016-02-16 ENCOUNTER — Encounter: Payer: Self-pay | Admitting: Family Medicine

## 2016-02-16 VITALS — BP 150/90 | HR 81 | Temp 97.2°F | Resp 16 | Ht 61.0 in | Wt 201.0 lb

## 2016-02-16 DIAGNOSIS — G8929 Other chronic pain: Secondary | ICD-10-CM

## 2016-02-16 DIAGNOSIS — M25561 Pain in right knee: Secondary | ICD-10-CM

## 2016-02-16 DIAGNOSIS — J309 Allergic rhinitis, unspecified: Secondary | ICD-10-CM

## 2016-02-16 DIAGNOSIS — D649 Anemia, unspecified: Secondary | ICD-10-CM

## 2016-02-16 DIAGNOSIS — K029 Dental caries, unspecified: Secondary | ICD-10-CM

## 2016-02-16 DIAGNOSIS — I10 Essential (primary) hypertension: Secondary | ICD-10-CM

## 2016-02-16 DIAGNOSIS — R319 Hematuria, unspecified: Secondary | ICD-10-CM

## 2016-02-16 LAB — POCT URINALYSIS DIP (DEVICE)
BILIRUBIN URINE: NEGATIVE
GLUCOSE, UA: NEGATIVE mg/dL
KETONES UR: NEGATIVE mg/dL
LEUKOCYTES UA: NEGATIVE
Nitrite: NEGATIVE
PROTEIN: NEGATIVE mg/dL
SPECIFIC GRAVITY, URINE: 1.02 (ref 1.005–1.030)
Urobilinogen, UA: 0.2 mg/dL (ref 0.0–1.0)
pH: 6.5 (ref 5.0–8.0)

## 2016-02-16 LAB — CBC WITH DIFFERENTIAL/PLATELET
BASOS ABS: 50 {cells}/uL (ref 0–200)
Basophils Relative: 1 %
EOS ABS: 100 {cells}/uL (ref 15–500)
Eosinophils Relative: 2 %
HCT: 20.4 % — ABNORMAL LOW (ref 35.0–45.0)
HEMOGLOBIN: 5.6 g/dL — AB (ref 11.7–15.5)
Lymphocytes Relative: 30 %
Lymphs Abs: 1500 cells/uL (ref 850–3900)
MCH: 20.3 pg — AB (ref 27.0–33.0)
MCHC: 26.5 g/dL — AB (ref 32.0–36.0)
MCV: 73.9 fL — AB (ref 80.0–100.0)
MONOS PCT: 11 %
Monocytes Absolute: 550 cells/uL (ref 200–950)
NEUTROS ABS: 2800 {cells}/uL (ref 1500–7800)
NEUTROS PCT: 56 %
PLATELETS: 106 10*3/uL — AB (ref 140–400)
RBC: 2.76 MIL/uL — ABNORMAL LOW (ref 3.80–5.10)
RDW: 20.7 % — ABNORMAL HIGH (ref 11.0–15.0)
WBC: 5 10*3/uL (ref 3.8–10.8)

## 2016-02-16 LAB — BASIC METABOLIC PANEL WITH GFR
BUN: 8 mg/dL (ref 7–25)
CHLORIDE: 106 mmol/L (ref 98–110)
CO2: 23 mmol/L (ref 20–31)
CREATININE: 0.47 mg/dL — AB (ref 0.50–1.05)
Calcium: 8.9 mg/dL (ref 8.6–10.4)
GFR, Est African American: >89 mL/min (ref >=60)
GFR, Est Non African American: >89 mL/min (ref >=60)
Glucose, Bld: 86 mg/dL (ref 65–99)
Potassium: 3.7 mmol/L (ref 3.5–5.3)
Sodium: 137 mmol/L (ref 135–146)

## 2016-02-16 MED ORDER — LISINOPRIL-HYDROCHLOROTHIAZIDE 20-25 MG PO TABS: 1.0000 | ORAL_TABLET | Freq: Every day | ORAL | Status: DC

## 2016-02-16 MED ORDER — HYDROXYZINE HCL 25 MG PO TABS: 25.0000 mg | ORAL_TABLET | Freq: Four times a day (QID) | ORAL | Status: DC

## 2016-02-16 MED ORDER — DICLOFENAC SODIUM 1 % TD GEL: 2.0000 g | Freq: Four times a day (QID) | TRANSDERMAL | Status: DC

## 2016-02-16 MED FILL — ?MONTELUKAST SOD 10 MG TAB: 10 | 30 days supply | Qty: 30 | Fill #1

## 2016-02-16 MED FILL — hydrOXYzine HCL 25 MG TABS: 25 | 10 days supply | Qty: 30 | Fill #0

## 2016-02-16 MED FILL — LISINOPRIL-HCTZ 20-25 MG TA: 20-25 | 30 days supply | Qty: 30 | Fill #0

## 2016-02-16 MED FILL — DICLOFENAC SODIUM 1% GEL: 1 | 15 days supply | Qty: 100 | Fill #0

## 2016-02-16 MED FILL — VENTOLIN HFA 90 MCG INHALER: 108 (90 BAS | 25 days supply | Qty: 18 | Fill #0

## 2016-02-16 NOTE — Progress Notes (Signed)
4  Subjective:    Patient ID: Caroline Ryan, female    DOB: 05-Apr-1966, 50 y.o.   MRN: [id number]  HPI  [name], a 50 year old female presents for a 3 month follow up of hypertension. She says that she has been out of anti-hypertensive medication for 1 week.  She states that she has not been exercising or following a lowfat, low sodium diet. She reports that she was previously taking Lisinopril-Hydrochlorothiazide 20-25 mg daily. She currently denies dizziness, fatigue, syncope, chest pains, heart palpitations, or swelling of lower extremities.   [name] is complaining of right knee pain. Onset of the symptoms was several months ago. Patient has not identified an inciting event.  Current symptoms include stiffness, swelling and pain. Pain is aggravated by any weight bearing, going up and down stairs, lateral movements and pivoting.  Patient has had no prior knee problems. Patient denies having evaluation or treatment to date. She reports that pain intensity is 8/10 described as intermittent and aching. She last had Ibuprofen 800 mg this am with minimal relief.   Past Medical History  Diagnosis Date  . Headache(784.0)   . Infection   . Asthma   . Urinary tract infection   . Fibroid   . Hypertension    Social History   Social History  . Marital Status: Single    Spouse Name: N/A  . Number of Children: N/A  . Years of Education: N/A   Occupational History  . Not on file.   Social History Main Topics  . Smoking status: Former Research scientist (life sciences)  . Smokeless tobacco: Not on file     Comment: as teenager  . Alcohol Use: No  . Drug Use: No  . Sexual Activity: Yes    Birth Control/ Protection: Condom   Other Topics Concern  . Not on file   Social History Narrative   Immunization History  Administered Date(s) Administered  . Influenza Whole 06/30/2009  . Influenza,inj,Quad PF,36+ Mos 06/27/2015  . Pneumococcal Polysaccharide-23 04/14/2008, 06/27/2015  . Td 10/18/2005    Review of Systems  Constitutional: Negative for fever and fatigue.  HENT: Positive for dental problem.   Eyes: Negative for photophobia and visual disturbance.  Cardiovascular: Negative.   Gastrointestinal: Negative.   Endocrine: Negative for polydipsia, polyphagia and polyuria.  Genitourinary: Negative.  Negative for vaginal discharge and vaginal pain.  Musculoskeletal: Positive for myalgias (bilateral knee pain).  Skin: Negative.   Neurological: Negative.  Negative for dizziness, tremors, weakness and numbness.  Hematological: Negative.   Psychiatric/Behavioral: Negative for suicidal ideas and sleep disturbance.       Objective:   Physical Exam  Constitutional: She is oriented to person, place, and time. She appears well-developed and well-nourished.  HENT:  Head: Normocephalic and atraumatic.  Right Ear: Hearing, tympanic membrane, external ear and ear canal normal.  Left Ear: Hearing, tympanic membrane, external ear and ear canal normal.  Nose: Nose normal.  Mouth/Throat: Abnormal dentition. Dental caries present.  Eyes: Conjunctivae and EOM are normal. Pupils are equal, round, and reactive to light.  Neck: Normal range of motion. Neck supple.  Cardiovascular: Normal rate, regular rhythm, normal heart sounds and intact distal pulses.   Pulmonary/Chest: Effort normal and breath sounds normal.  Abdominal: Soft. Bowel sounds are normal.  Musculoskeletal:       Right knee: She exhibits decreased range of motion. She exhibits no erythema.       Left knee: She exhibits decreased range of motion. She exhibits no swelling,  no ecchymosis and no erythema.  Neurological: She is alert and oriented to person, place, and time. She has normal reflexes.  Skin: Skin is warm and dry.  Psychiatric: She has a normal mood and affect. Her behavior is normal. Judgment and thought content normal.      BP 150/90 mmHg  Pulse 81  Temp(Src) 97.2 F (36.2 C) (Oral)  Resp 16  Ht 5\' 1"  (1.549 m)   Wt 201 lb (91.173 kg)  BMI 38.00 kg/m2  SpO2 100%  LMP 01/19/2016 Assessment & Plan:  1. HYPERTENSION, BENIGN ESSENTIAL Blood pressure is above goal. Patient has not taken medications for 1 week. There have been some probable  compliance issues here. I have discussed with her the great importance of following the treatment plan exactly as directed in order to achieve a good medical outcome. - lisinopril-hydrochlorothiazide (PRINZIDE,ZESTORETIC) 20-25 MG tablet; Take 1 tablet by mouth daily.  Dispense: 30 tablet; Refill: 5 - BASIC METABOLIC PANEL WITH GFR - Urinalysis Dipstick  2. Chronic knee pain, right  - diclofenac sodium (VOLTAREN) 1 % GEL; Apply 2 g topically 4 (four) times daily.  Dispense: 1 Tube; Refill: 5  3. Allergic rhinitis, unspecified allergic rhinitis type  - hydrOXYzine (ATARAX/VISTARIL) 25 MG tablet; Take 1 tablet (25 mg total) by mouth every 6 (six) hours.  Dispense: 30 tablet; Refill: 5  4. ANEMIA  - CBC with Differential  5. Dental caries  - Ambulatory referral to Dentistry   RTC. 6 months for hypertension. We will also schedule a pap smear during f/u visit.     Dorena Dew, FNP

## 2016-02-16 NOTE — Telephone Encounter (Signed)
Medication sent into pharmacy electronically

## 2016-02-17 ENCOUNTER — Other Ambulatory Visit: Payer: Self-pay | Admitting: Family Medicine

## 2016-02-17 ENCOUNTER — Ambulatory Visit (HOSPITAL_COMMUNITY)
Admission: RE | Admit: 2016-02-17 | Discharge: 2016-02-17 | Disposition: A | Discharge status: Home / Self Care | Payer: Self-pay | Source: Ambulatory Visit | Attending: Family Medicine | Admitting: Family Medicine

## 2016-02-17 ENCOUNTER — Telehealth: Payer: Self-pay | Admitting: Family Medicine

## 2016-02-17 VITALS — BP 130/78 | HR 64 | Temp 98.2°F | Resp 20

## 2016-02-17 DIAGNOSIS — D62 Acute posthemorrhagic anemia: Secondary | ICD-10-CM

## 2016-02-17 DIAGNOSIS — N924 Excessive bleeding in the premenopausal period: Secondary | ICD-10-CM

## 2016-02-17 LAB — CBC
HEMATOCRIT: 19.2 % — AB (ref 36.0–46.0)
HEMOGLOBIN: 5.5 g/dL — AB (ref 12.0–15.0)
MCH: 20.3 pg — AB (ref 26.0–34.0)
MCHC: 28.6 g/dL — AB (ref 30.0–36.0)
MCV: 70.8 fL — ABNORMAL LOW (ref 78.0–100.0)
Platelets: 101 10*3/uL — ABNORMAL LOW (ref 150–400)
RBC: 2.71 MIL/uL — ABNORMAL LOW (ref 3.87–5.11)
RDW: 20.5 % — ABNORMAL HIGH (ref 11.5–15.5)
WBC: 5.2 10*3/uL (ref 4.0–10.5)

## 2016-02-17 LAB — MICROALBUMIN / CREATININE URINE RATIO
CREATININE, URINE: 172 mg/dL (ref 20–320)
MICROALB UR: 4.1 mg/dL
Microalb Creat Ratio: 24 mcg/mg creat (ref <30)

## 2016-02-17 LAB — PREPARE RBC (CROSSMATCH)

## 2016-02-17 LAB — ABO/RH: ABO/RH(D): O POS

## 2016-02-17 LAB — HEMOGLOBIN AND HEMATOCRIT, BLOOD
HCT: 22.6 % — ABNORMAL LOW (ref 36.0–46.0)
Hemoglobin: 6.6 g/dL — CL (ref 12.0–15.0)

## 2016-02-17 MED ORDER — SODIUM CHLORIDE 0.9 % IV SOLN
Freq: Once | INTRAVENOUS | Status: AC
  Administered 2016-02-17: 12:00:00 via INTRAVENOUS

## 2016-02-17 NOTE — Procedures (Signed)
[facility]  Procedure Note  Caroline Ryan [id number] DOB: Mar 19, 1966 DOA: 02/17/2016   PCP: Dorena Dew, FNP   Associated Diagnosis: D62  Procedure Note: IV started, 1 unit of PRBC transfused per order, post H&H drawn 1 hour after infusion complete, IV discontinued.   Condition During Procedure:Patient stable   Condition at Discharge:Patient stable, ambulatory at discharge.   Roberto Scales, RN  [facility]

## 2016-02-17 NOTE — Discharge Instructions (Signed)
Blood Transfusion   A blood transfusion is a procedure that gives you donated blood through an IV tube. You may need blood because of illness, surgery, or injury. The blood may come from a donor. The blood may also be your own blood that you donated earlier.  The blood you get is made up of different types of cells. You may get:    Red blood cells. These carry oxygen and replace lost blood.    Platelets. These control bleeding.    Plasma. This helps blood to clot.  If you have a clotting disorder, you may also get other types of blood products.   BEFORE THE PROCEDURE   You may have a blood test. This finds out what type of blood you have. It also finds out what kind of blood your body will accept.    If you are going to have a planned surgery, you may donate your own blood. This is done in case you need to have a transfusion.    If you have had an allergic transfusion reaction before, you may be given medicine to help prevent a reaction. Take this medicine only as told by your doctor.   You will have your temperature, blood pressure, and pulse checked.  PROCEDURE    An IV will be started in your hand or arm.    The bag of donated blood will be attached to your IV and run into your vein.    A doctor will regularly check your temperature, blood pressure, and pulse during the procedure. This is done to find any early signs of a transfusion reaction.   If you have any signs or symptoms of a reaction, the procedure may be stopped and you may be given medicine.    When the transfusion is over, your IV will be removed.    Pressure may be applied to the IV site for a few minutes.    A bandage (dressing) will be applied.   The procedure may vary among doctors and hospitals.   AFTER THE PROCEDURE   Your blood pressure, temperature, and pulse will be checked regularly.     This information is not intended to replace advice given to you by your health care provider. Make sure you discuss any questions  you have with your health care provider.     Document Released: 11/02/2008 Document Revised: 08/27/2014 Document Reviewed: 06/16/2014  Elsevier Interactive Patient Education 2016 Elsevier Inc.

## 2016-02-17 NOTE — Telephone Encounter (Signed)
[name], a 50 year old female with a history of iron deficiency anemia has a hemoglobin of 5.6. Will transfuse a unit of packed red blood cells. [name] also has a history of menorrhagia. Will schedule a pelvic/transvaginal ultrasound for further evaluation.   Dorena Dew, FNP

## 2016-02-17 NOTE — Progress Notes (Signed)
CRITICAL VALUE ALERT  Critical value received:  Hgb 5.5  Date of notification: 02/17/2016  Time of notification:  [id number] Critical value read back:Yes.    Nurse who received alert:  Roberto Scales, RN  [place] Hollis, NP notified verbally.  Order to transfuse already in system.

## 2016-02-18 LAB — TYPE AND SCREEN
ABO/RH(D): O POS
Antibody Screen: NEGATIVE
UNIT DIVISION: 0

## 2016-02-24 ENCOUNTER — Ambulatory Visit (HOSPITAL_COMMUNITY)
Admission: RE | Admit: 2016-02-24 | Discharge: 2016-02-24 | Disposition: A | Discharge status: Home / Self Care | Payer: Self-pay | Source: Ambulatory Visit | Attending: Family Medicine | Admitting: Family Medicine

## 2016-02-24 DIAGNOSIS — D259 Leiomyoma of uterus, unspecified: Secondary | ICD-10-CM | POA: Insufficient documentation

## 2016-02-24 DIAGNOSIS — N924 Excessive bleeding in the premenopausal period: Secondary | ICD-10-CM | POA: Insufficient documentation

## 2016-02-24 DIAGNOSIS — N83292 Other ovarian cyst, left side: Secondary | ICD-10-CM | POA: Insufficient documentation

## 2016-02-27 ENCOUNTER — Other Ambulatory Visit: Payer: Self-pay | Admitting: Family Medicine

## 2016-02-27 DIAGNOSIS — N921 Excessive and frequent menstruation with irregular cycle: Secondary | ICD-10-CM

## 2016-02-27 DIAGNOSIS — D259 Leiomyoma of uterus, unspecified: Secondary | ICD-10-CM

## 2016-02-27 DIAGNOSIS — D500 Iron deficiency anemia secondary to blood loss (chronic): Secondary | ICD-10-CM

## 2016-02-27 NOTE — Progress Notes (Signed)
Called and spoke with patient advised of need for lab draw and referral for gynecology. Patient verbalized understanding and lab appointment was set for Friday 03/02/2016 and referral was sent into gyn. Thanks!

## 2016-03-02 ENCOUNTER — Other Ambulatory Visit: Payer: Self-pay

## 2016-04-06 ENCOUNTER — Telehealth: Payer: Self-pay

## 2016-04-06 DIAGNOSIS — J4520 Mild intermittent asthma, uncomplicated: Secondary | ICD-10-CM

## 2016-04-09 MED ORDER — ALBUTEROL SULFATE HFA 108 (90 BASE) MCG/ACT IN AERS: 2.0000 | INHALATION_SPRAY | Freq: Four times a day (QID) | RESPIRATORY_TRACT | 2 refills | Status: DC | PRN

## 2016-04-09 NOTE — Telephone Encounter (Signed)
Sent in to pharmacy.  

## 2016-04-11 ENCOUNTER — Encounter: Payer: Self-pay | Admitting: Obstetrics & Gynecology

## 2016-04-12 ENCOUNTER — Ambulatory Visit: Payer: Self-pay | Admitting: Family Medicine

## 2016-07-04 ENCOUNTER — Encounter: Payer: Self-pay | Admitting: Family Medicine

## 2016-07-04 ENCOUNTER — Other Ambulatory Visit: Payer: Self-pay | Admitting: Family Medicine

## 2016-07-04 ENCOUNTER — Ambulatory Visit (INDEPENDENT_AMBULATORY_CARE_PROVIDER_SITE_OTHER): Payer: No Typology Code available for payment source | Admitting: Family Medicine

## 2016-07-04 VITALS — BP 159/70 | HR 78 | Temp 97.6°F | Resp 16 | Ht 61.0 in | Wt 202.0 lb

## 2016-07-04 DIAGNOSIS — Z0000 Encounter for general adult medical examination without abnormal findings: Secondary | ICD-10-CM

## 2016-07-04 DIAGNOSIS — I10 Essential (primary) hypertension: Secondary | ICD-10-CM

## 2016-07-04 DIAGNOSIS — Z23 Encounter for immunization: Secondary | ICD-10-CM

## 2016-07-04 LAB — CBC WITH DIFFERENTIAL/PLATELET
BASOS ABS: 49 {cells}/uL (ref 0–200)
Basophils Relative: 1 %
EOS PCT: 1 %
Eosinophils Absolute: 49 cells/uL (ref 15–500)
HCT: 22.9 % — ABNORMAL LOW (ref 35.0–45.0)
Hemoglobin: 6.3 g/dL — ABNORMAL LOW (ref 11.7–15.5)
LYMPHS PCT: 25 %
Lymphs Abs: 1225 cells/uL (ref 850–3900)
MCH: 20.5 pg — AB (ref 27.0–33.0)
MCHC: 27.3 g/dL — AB (ref 32.0–36.0)
MCV: 74.4 fL — ABNORMAL LOW (ref 80.0–100.0)
MONOS PCT: 9 %
Monocytes Absolute: 441 cells/uL (ref 200–950)
NEUTROS ABS: 3136 {cells}/uL (ref 1500–7800)
NEUTROS PCT: 64 %
PLATELETS: 102 10*3/uL — AB (ref 140–400)
RBC: 3.08 MIL/uL — ABNORMAL LOW (ref 3.80–5.10)
RDW: 20 % — AB (ref 11.0–15.0)
WBC: 4.9 10*3/uL (ref 3.8–10.8)

## 2016-07-04 LAB — TSH: TSH: 1.93 mIU/L

## 2016-07-04 LAB — COMPLETE METABOLIC PANEL WITH GFR
ALT: 9 U/L (ref 6–29)
AST: 12 U/L (ref 10–35)
Albumin: 3.8 g/dL (ref 3.6–5.1)
Alkaline Phosphatase: 76 U/L (ref 33–130)
BUN: 12 mg/dL (ref 7–25)
CHLORIDE: 106 mmol/L (ref 98–110)
CO2: 25 mmol/L (ref 20–31)
Calcium: 9.2 mg/dL (ref 8.6–10.4)
Creat: 0.52 mg/dL (ref 0.50–1.05)
GLUCOSE: 84 mg/dL (ref 65–99)
POTASSIUM: 3.7 mmol/L (ref 3.5–5.3)
SODIUM: 138 mmol/L (ref 135–146)
Total Bilirubin: 0.3 mg/dL (ref 0.2–1.2)
Total Protein: 6.7 g/dL (ref 6.1–8.1)

## 2016-07-04 MED ORDER — MONTELUKAST SODIUM 10 MG PO TABS: 10.0000 mg | ORAL_TABLET | Freq: Every day | ORAL | 3 refills | Status: DC

## 2016-07-04 MED ORDER — FERROUS SULFATE 325 (65 FE) MG PO TABS: 325.0000 mg | ORAL_TABLET | Freq: Every day | ORAL | 3 refills | Status: DC

## 2016-07-04 MED ORDER — HYDROXYZINE HCL 25 MG PO TABS: 25.0000 mg | ORAL_TABLET | Freq: Four times a day (QID) | ORAL | 3 refills | Status: DC | PRN

## 2016-07-04 MED ORDER — CETIRIZINE HCL 10 MG PO TABS: 10.0000 mg | ORAL_TABLET | Freq: Every day | ORAL | 5 refills | Status: DC

## 2016-07-04 MED ORDER — LISINOPRIL-HYDROCHLOROTHIAZIDE 20-25 MG PO TABS: 1.0000 | ORAL_TABLET | Freq: Every day | ORAL | 5 refills | Status: DC

## 2016-07-04 MED ORDER — ALBUTEROL SULFATE HFA 108 (90 BASE) MCG/ACT IN AERS: 2.0000 | INHALATION_SPRAY | Freq: Four times a day (QID) | RESPIRATORY_TRACT | 2 refills | Status: DC | PRN

## 2016-07-04 MED ORDER — DICLOFENAC SODIUM 1 % TD GEL: 2.0000 g | Freq: Four times a day (QID) | TRANSDERMAL | 5 refills | Status: DC

## 2016-07-04 MED FILL — hydrOXYzine HCL 25 MG TABS: 25 | 7 days supply | Qty: 30 | Fill #0

## 2016-07-04 MED FILL — ?CETIRIZINE HCL 10 MG TABLE: 10 | 30 days supply | Qty: 30 | Fill #0

## 2016-07-04 MED FILL — ?MONTELUKAST SOD 10 MG TAB: 10 MG | 30 days supply | Qty: 30 | Fill #0

## 2016-07-04 MED FILL — VENTOLIN HFA 90 MCG INHALER: 108 (90 BAS | 20 days supply | Qty: 18 | Fill #0

## 2016-07-04 MED FILL — LISINOPRIL-HCTZ 20-25 MG TA: 20-25 | 30 days supply | Qty: 30 | Fill #0

## 2016-07-04 NOTE — Progress Notes (Signed)
Caroline Ryan, is a 50 y.o. female  [id number]  [id number]  DOB - Jul 14, 1966  CC:  Chief Complaint  Patient presents with  . Hypertension  . Follow-up    needs refill on inhaler        HPI: Caroline Ryan is a 50 y.o. female here for follow-up hypertension. She also has a history of asthma, headaches, uterine fibroids. She is requesting a referral to [facility]. She is needing refeill on albuterol, voltaren gel, Ferrous Sulfate, lis/hct. Singular, zyrtec and hydroxizine, which she uses for itching due to allergies. BP today is 140/62.  Health Maintenance: She is to receive Tdap and flu vaccines today. She needs screening for HIV. She reports her mammogram is current. Needs a PAP. Also needs screening for Hep C.    No Known Allergies Past Medical History:  Diagnosis Date  . Asthma   . Fibroid   . Headache(784.0)   . Hypertension   . Infection   . Urinary tract infection    Current Outpatient Prescriptions on File Prior to Visit  Medication Sig Dispense Refill  . hydrOXYzine (ATARAX/VISTARIL) 25 MG tablet Take 1 tablet (25 mg total) by mouth every 6 (six) hours. 30 tablet 5   No current facility-administered medications on file prior to visit.    Family History  Problem Relation Age of Onset  . Other Neg Hx    Social History   Social History  . Marital status: Single    Spouse name: N/A  . Number of children: N/A  . Years of education: N/A   Occupational History  . Not on file.   Social History Main Topics  . Smoking status: Former Research scientist (life sciences)  . Smokeless tobacco: Never Used     Comment: as teenager  . Alcohol use No  . Drug use: No  . Sexual activity: Yes    Birth control/ protection: Condom   Other Topics Concern  . Not on file   Social History Narrative  . No narrative on file    Review of Systems: Constitutional: + for night sweats. Skin: + for itchiness HENT: Negative  Eyes: Negative  Neck: Negative Respiratory: + for some  shortness of breath with exertion (uses albuterol) Cardiovascular: Negative Gastrointestinal: Negative Genitourinary: Negative  Musculoskeletal: + for knee pain Neurological: Negative for Hematological: Negative  Psychiatric/Behavioral: Negative    Objective:   Vitals:   07/04/16 0838  BP: (!) 159/70  Pulse: 78  Resp: 16  Temp: 97.6 F (36.4 C)    Physical Exam: Constitutional: Patient appears well-developed and well-nourished. No distress. HENT: Normocephalic, atraumatic, External right and left ear normal. Oropharynx is clear and moist.  Eyes: Conjunctivae and EOM are normal. PERRLA, no scleral icterus. Neck: Normal ROM. Neck supple. No lymphadenopathy, No thyromegaly. CVS: RRR, S1/S2 +, no murmurs, no gallops, no rubs Pulmonary: Effort and breath sounds normal, no stridor, rhonchi, wheezes, rales.  Abdominal: Soft. Normoactive BS,, no distension, tenderness, rebound or guarding.  Musculoskeletal: Normal range of motion. No edema and no tenderness.  Neuro: Alert.Normal muscle tone coordination. Non-focal Skin: Skin is warm and dry. No rash noted. Not diaphoretic. No erythema. No pallor. Psychiatric: Normal mood and affect. Behavior, judgment, thought content normal.  Lab Results  Component Value Date   WBC 5.2 02/17/2016   HGB 6.6 (LL) 02/17/2016   HCT 22.6 (L) 02/17/2016   MCV 70.8 (L) 02/17/2016   PLT 101 (L) 02/17/2016   Lab Results  Component Value Date   CREATININE 0.47 (L) 02/16/2016  BUN 8 02/16/2016   NA 137 02/16/2016   K 3.7 02/16/2016   CL 106 02/16/2016   CO2 23 02/16/2016    Lab Results  Component Value Date   HGBA1C 5.1 06/27/2015   Lipid Panel     Component Value Date/Time   CHOL 157 06/27/2015 1216   TRIG 44 06/27/2015 1216   HDL 79 06/27/2015 1216   CHOLHDL 2.0 06/27/2015 1216   VLDL 9 06/27/2015 1216   LDLCALC 69 06/27/2015 1216        Assessment and plan:   1. Healthcare maintenance  - POC Hemoccult Bld/Stl (3-Cd Home  Screen); Future - TSH - CBC with Differential - COMPLETE METABOLIC PANEL WITH GFR - Hemoglobin A1c - HIV antibody (with reflex) - Hepatitis C Antibody  2. HYPERTENSION, BENIGN ESSENTIAL  - lisinopril-hydrochlorothiazide (PRINZIDE,ZESTORETIC) 20-25 MG tablet; Take 1 tablet by mouth daily.  Dispense: 30 tablet; Refill: 5  3. Encounter for immunization  - Flu Vaccine QUAD 36+ mos IM   Return in about 6 months (around 01/01/2017).  The patient was given clear instructions to go to ER or return to medical center if symptoms don't improve, worsen or new problems develop. The patient verbalized understanding.    Micheline Chapman FNP  07/04/2016, 2:44 PM

## 2016-07-05 ENCOUNTER — Encounter: Payer: Self-pay | Admitting: Family Medicine

## 2016-07-05 ENCOUNTER — Telehealth: Payer: Self-pay

## 2016-07-05 ENCOUNTER — Other Ambulatory Visit: Payer: Self-pay | Admitting: Family Medicine

## 2016-07-05 DIAGNOSIS — D649 Anemia, unspecified: Secondary | ICD-10-CM

## 2016-07-05 LAB — HEMOGLOBIN A1C
Hgb A1c MFr Bld: 4.5 % (ref <5.7)
MEAN PLASMA GLUCOSE: 82 mg/dL

## 2016-07-05 LAB — CBC
HCT: 24.1 % — ABNORMAL LOW (ref 35.0–45.0)
HEMOGLOBIN: 6.6 g/dL — AB (ref 11.7–15.5)
MCH: 20.8 pg — AB (ref 27.0–33.0)
MCHC: 27.4 g/dL — AB (ref 32.0–36.0)
MCV: 75.8 fL — ABNORMAL LOW (ref 80.0–100.0)
Platelets: 94 10*3/uL — ABNORMAL LOW (ref 140–400)
RBC: 3.18 MIL/uL — ABNORMAL LOW (ref 3.80–5.10)
RDW: 19.9 % — AB (ref 11.0–15.0)
WBC: 5.1 10*3/uL (ref 3.8–10.8)

## 2016-07-05 LAB — HIV ANTIBODY (ROUTINE TESTING W REFLEX): HIV: NONREACTIVE

## 2016-07-05 LAB — VITAMIN B12: VITAMIN B 12: 602 pg/mL (ref 200–1100)

## 2016-07-05 LAB — HEPATITIS C ANTIBODY: HCV AB: NEGATIVE

## 2016-07-05 LAB — RETICULOCYTES
ABS Retic: 69960 cells/uL (ref 20000–80000)
RBC.: 3.18 MIL/uL — AB (ref 3.80–5.10)
RETIC CT PCT: 2.2 %

## 2016-07-05 LAB — FOLATE: FOLATE: 14.6 ng/mL (ref >5.4)

## 2016-07-05 LAB — FERRITIN: FERRITIN: 2 ng/mL — AB (ref 10–232)

## 2016-07-05 NOTE — Progress Notes (Signed)
Received call from [facility]; pt Hgb level 6.3; NP notified

## 2016-07-10 ENCOUNTER — Other Ambulatory Visit: Payer: Self-pay | Admitting: Family Medicine

## 2016-07-10 DIAGNOSIS — D509 Iron deficiency anemia, unspecified: Secondary | ICD-10-CM

## 2016-07-10 LAB — IRON: Iron: 13 ug/dL — ABNORMAL LOW (ref 45–160)

## 2016-07-10 LAB — IBC PANEL
%SAT: 3 % — ABNORMAL LOW (ref 11–50)
TIBC: 462 ug/dL — ABNORMAL HIGH (ref 250–450)
UIBC: 449 ug/dL — AB (ref 125–400)

## 2016-08-23 ENCOUNTER — Ambulatory Visit: Payer: Self-pay | Admitting: Family Medicine

## 2016-09-14 ENCOUNTER — Telehealth: Payer: Self-pay | Admitting: Family Medicine

## 2016-09-14 ENCOUNTER — Encounter: Payer: Self-pay | Admitting: Family Medicine

## 2016-09-14 ENCOUNTER — Ambulatory Visit (INDEPENDENT_AMBULATORY_CARE_PROVIDER_SITE_OTHER): Payer: No Typology Code available for payment source | Admitting: Family Medicine

## 2016-09-14 VITALS — BP 173/80 | HR 81 | Temp 97.7°F | Resp 18 | Ht 61.0 in | Wt 198.0 lb

## 2016-09-14 DIAGNOSIS — I10 Essential (primary) hypertension: Secondary | ICD-10-CM

## 2016-09-14 DIAGNOSIS — N921 Excessive and frequent menstruation with irregular cycle: Secondary | ICD-10-CM

## 2016-09-14 DIAGNOSIS — D500 Iron deficiency anemia secondary to blood loss (chronic): Secondary | ICD-10-CM

## 2016-09-14 DIAGNOSIS — R5383 Other fatigue: Secondary | ICD-10-CM

## 2016-09-14 DIAGNOSIS — J4520 Mild intermittent asthma, uncomplicated: Secondary | ICD-10-CM

## 2016-09-14 DIAGNOSIS — D259 Leiomyoma of uterus, unspecified: Secondary | ICD-10-CM

## 2016-09-14 DIAGNOSIS — G629 Polyneuropathy, unspecified: Secondary | ICD-10-CM

## 2016-09-14 LAB — CBC WITH DIFFERENTIAL/PLATELET
BASOS ABS: 44 {cells}/uL (ref 0–200)
Basophils Relative: 1 %
EOS ABS: 44 {cells}/uL (ref 15–500)
EOS PCT: 1 %
HEMATOCRIT: 24 % — AB (ref 35.0–45.0)
HEMOGLOBIN: 6.4 g/dL — AB (ref 11.7–15.5)
LYMPHS ABS: 1452 {cells}/uL (ref 850–3900)
Lymphocytes Relative: 33 %
MCH: 19 pg — AB (ref 27.0–33.0)
MCHC: 26.7 g/dL — ABNORMAL LOW (ref 32.0–36.0)
MCV: 71.4 fL — AB (ref 80.0–100.0)
MONO ABS: 396 {cells}/uL (ref 200–950)
Monocytes Relative: 9 %
NEUTROS ABS: 2464 {cells}/uL (ref 1500–7800)
Neutrophils Relative %: 56 %
Platelets: 258 10*3/uL (ref 140–400)
RBC: 3.36 MIL/uL — ABNORMAL LOW (ref 3.80–5.10)
RDW: 21.8 % — ABNORMAL HIGH (ref 11.0–15.0)
WBC: 4.4 10*3/uL (ref 3.8–10.8)

## 2016-09-14 LAB — RETICULOCYTES
ABS RETIC: 72820 {cells}/uL (ref 20000–80000)
RBC.: 3.31 MIL/uL — AB (ref 3.80–5.10)
Retic Ct Pct: 2.2 %

## 2016-09-14 LAB — FERRITIN: Ferritin: 2 ng/mL — ABNORMAL LOW (ref 10–232)

## 2016-09-14 MED ORDER — MONTELUKAST SODIUM 10 MG PO TABS: 10.0000 mg | ORAL_TABLET | Freq: Every day | ORAL | 3 refills | Status: DC

## 2016-09-14 MED ORDER — ALBUTEROL SULFATE HFA 108 (90 BASE) MCG/ACT IN AERS: 2.0000 | INHALATION_SPRAY | Freq: Four times a day (QID) | RESPIRATORY_TRACT | 2 refills | Status: DC | PRN

## 2016-09-14 MED ORDER — LISINOPRIL-HYDROCHLOROTHIAZIDE 20-25 MG PO TABS: 1.0000 | ORAL_TABLET | Freq: Every day | ORAL | 5 refills | Status: DC

## 2016-09-14 MED ORDER — GABAPENTIN 100 MG PO CAPS: 100.0000 mg | ORAL_CAPSULE | Freq: Three times a day (TID) | ORAL | 0 refills | Status: DC

## 2016-09-14 MED FILL — MONTELUKAST SOD 10 MG TAB: 10 | 30 days supply | Qty: 30 | Fill #1

## 2016-09-14 MED FILL — LISINOPRIL-HCTZ 20-25 MG TA: 20-25 | 30 days supply | Qty: 30 | Fill #0

## 2016-09-14 MED FILL — ?GABAPENTIN 100 MG CAP: 30 days supply | Qty: 90 | Fill #0

## 2016-09-14 NOTE — Progress Notes (Deleted)
[name], a 51 year old female with a history of iron deficiency anemia has a hemoglobin of 5.6. Will transfuse a unit of packed red blood cells. [name] also has a history of menorrhagia. Will schedule a pelvic/transvaginal ultrasound for further evaluation.   Dorena Dew, FNP

## 2016-09-14 NOTE — Telephone Encounter (Signed)
[name], a 51 year old female with a history of iron deficiency anemia has a hemoglobin of 6.4. Will transfuse a unit of packed red blood cells oin 09/17/2016. [name] also has a history of menorrhagia. Patient has a history of menorrhagia and uterine fibroids resulting in anemia of chronic blood loss. Sent a referral to gynecology.   Caroline Dew, FNP

## 2016-09-14 NOTE — Patient Instructions (Addendum)

## 2016-09-14 NOTE — Progress Notes (Signed)
4  Subjective:    Patient ID: Caroline Ryan, female    DOB: 1966-08-14, 51 y.o.   MRN: [id number]  HPI  Caroline Ryan, a 51 year old female presents for a 3 month follow up of hypertension and anemia. She says that she has been out of anti-hypertensive medication for 1 week.  She states that she has not been exercising or following a lowfat, low sodium diet. She reports that she was previously taking Lisinopril-Hydrochlorothiazide 20-25 mg daily. She currently denies dizziness, syncope, chest pains, heart palpitations, or swelling of lower extremities.   Patient presents for presents evaluation of anemia. Previous hemoglobin was 6.3 in November. Patient reports increase fatigue. She says that she has been taking ferrous sulfate and eating an iron rich diet. She has a history of uterine fibroids and continues to have menorrhagia. She was referred to gynecology in July, but has not scheduled an appointment. Anemia was found by routine CBC.  It has been present for several months. Caroline Ryan is complaining of right knee pain. Onset of the symptoms was several months ago. Patient has not identified an inciting event.  Current symptoms include stiffness, swelling and pain. She also complains of numbness and tingling to right upper and lower extremities Pain is aggravated by any weight bearing, going up and down stairs, lateral movements and pivoting.  Patient has had no prior knee problems. Patient denies having evaluation or treatment to date. She reports that pain intensity is 5/10 described as intermittent and aching. She last had Ibuprofen 800 mg this am with minimal relief.   Past Medical History:  Diagnosis Date  . Asthma   . Fibroid   . Headache(784.0)   . Hypertension   . Infection   . Urinary tract infection    Social History   Social History  . Marital status: Single    Spouse name: N/A  . Number of children: N/A  . Years of education: N/A   Occupational History  . Not on file.    Social History Main Topics  . Smoking status: Former Games developer  . Smokeless tobacco: Never Used     Comment: as teenager  . Alcohol use No  . Drug use: No  . Sexual activity: Yes    Birth control/ protection: Condom   Other Topics Concern  . Not on file   Social History Narrative  . No narrative on file   Immunization History  Administered Date(s) Administered  . Influenza Whole 06/30/2009  . Influenza,inj,Quad PF,36+ Mos 06/27/2015, 07/04/2016  . Pneumococcal Polysaccharide-23 04/14/2008, 06/27/2015  . Td 10/18/2005  . Tdap 07/04/2016   Review of Systems  Constitutional: Negative for fatigue and fever.  HENT: Positive for dental problem.   Eyes: Negative for photophobia and visual disturbance.  Respiratory: Positive for cough.   Cardiovascular: Negative.   Gastrointestinal: Negative.   Endocrine: Negative for polydipsia, polyphagia and polyuria.  Genitourinary: Negative.  Negative for vaginal discharge and vaginal pain.  Musculoskeletal: Positive for myalgias (bilateral knee pain).  Skin: Negative.   Neurological: Negative.  Negative for dizziness, tremors, weakness and numbness.  Hematological: Negative.   Psychiatric/Behavioral: Negative for sleep disturbance and suicidal ideas.       Objective:   Physical Exam  Constitutional: She is oriented to person, place, and time. She appears well-developed and well-nourished.  HENT:  Head: Normocephalic and atraumatic.  Right Ear: Hearing, tympanic membrane, external ear and ear canal normal.  Left Ear: Hearing, tympanic membrane, external ear and ear canal normal.  Nose: Nose normal.  Mouth/Throat: Abnormal dentition. Dental caries present.  Eyes: Conjunctivae and EOM are normal. Pupils are equal, round, and reactive to light.  Neck: Normal range of motion. Neck supple.  Cardiovascular: Normal rate, regular rhythm, normal heart sounds and intact distal pulses.   Pulmonary/Chest: Effort normal and breath sounds normal.   Abdominal: Soft. Bowel sounds are normal.  Musculoskeletal:       Right knee: She exhibits decreased range of motion. She exhibits no erythema.       Left knee: She exhibits decreased range of motion. She exhibits no swelling, no ecchymosis and no erythema.  Neurological: She is alert and oriented to person, place, and time. She has normal reflexes.  Skin: Skin is warm and dry.  Psychiatric: She has a normal mood and affect. Her behavior is normal. Judgment and thought content normal.      BP (!) 173/80 (BP Location: Right Arm, Patient Position: Sitting, Cuff Size: Large)   Pulse 81   Temp 97.7 F (36.5 C) (Oral)   Resp 18   Ht 5\' 1"  (1.549 m)   Wt 198 lb (89.8 kg)   LMP 09/13/2016   SpO2 100%   BMI 37.41 kg/m  Assessment & Plan:  1. HYPERTENSION, BENIGN ESSENTIAL Blood pressure is above goal on current medication regimen. Will send medication to pharmacy   2. Uterine leiomyoma, unspecified location - Ambulatory referral to Gynecology  3. Menorrhagia with irregular cycle - Ambulatory referral to Gynecology  4. Other fatigue - CBC with Differential - Ambulatory referral to Gynecology  5. Anemia due to chronic blood loss - Iron and TIBC - Reticulocytes - Ferritin, Serum (Serial)  6. Neuropathy (HCC) - gabapentin (NEURONTIN) 100 MG capsule; Take 1 capsule (100 mg total) by mouth 3 (three) times daily.  Dispense: 90 capsule; Refill: 0  7. Mild intermittent asthma, unspecified whether complicated - albuterol (PROVENTIL HFA;VENTOLIN HFA) 108 (90 Base) MCG/ACT inhaler; Inhale 2 puffs into the lungs every 6 (six) hours as needed for wheezing or shortness of breath. Asthma attacks  Dispense: 1 each; Refill: 2   RTC: Will follow up by phone after reviewing laboratory results   Harvest Stanco M, FNP  The patient was given clear instructions to go to ER or return to medical center if symptoms do not improve, worsen or new problems develop. The patient verbalized understanding.  Will notify patient with laboratory results.

## 2016-09-15 LAB — IRON AND TIBC
%SAT: 3 % — ABNORMAL LOW (ref 11–50)
Iron: 16 ug/dL — ABNORMAL LOW (ref 45–160)
TIBC: 503 ug/dL — AB (ref 250–450)
UIBC: 487 ug/dL — AB (ref 125–400)

## 2016-09-17 ENCOUNTER — Other Ambulatory Visit: Payer: Self-pay | Admitting: Family Medicine

## 2016-09-17 ENCOUNTER — Ambulatory Visit (HOSPITAL_COMMUNITY)
Admission: RE | Admit: 2016-09-17 | Discharge: 2016-09-17 | Disposition: A | Discharge status: Home / Self Care | Payer: No Typology Code available for payment source | Source: Ambulatory Visit | Attending: Family Medicine | Admitting: Family Medicine

## 2016-09-17 DIAGNOSIS — D500 Iron deficiency anemia secondary to blood loss (chronic): Secondary | ICD-10-CM

## 2016-09-17 LAB — HEMOGLOBIN AND HEMATOCRIT, BLOOD
HCT: 21.1 % — ABNORMAL LOW (ref 36.0–46.0)
HEMATOCRIT: 24.5 % — AB (ref 36.0–46.0)
Hemoglobin: 5.9 g/dL — CL (ref 12.0–15.0)
Hemoglobin: 7.1 g/dL — ABNORMAL LOW (ref 12.0–15.0)

## 2016-09-17 LAB — PREPARE RBC (CROSSMATCH)

## 2016-09-17 MED ORDER — SODIUM CHLORIDE 0.9 % IV SOLN
Freq: Once | INTRAVENOUS | Status: AC
  Administered 2016-09-17: 12:00:00 via INTRAVENOUS

## 2016-09-17 MED FILL — hydrOXYzine HCL 25 MG TABS: 25 | 7 days supply | Qty: 30 | Fill #1

## 2016-09-17 NOTE — Progress Notes (Signed)
Diagnosis Association: Iron deficiency anemia due to chronic blood loss (D50.0)  Provider: L. Hollis, NP  Procedure: Pt had blood drawn for h&h and a type and cross match. Pt then received 1 unit of PRBCs. H&H was repeated aprox 1 hr after blood infused.  Pt tolerated procedure well.  Post procedure: Pt alert, oriented and ambulatory at discharge.

## 2016-09-17 NOTE — Progress Notes (Signed)
CRITICAL VALUE ALERT  Critical value received: Hgb 5.9  Date of notification:  09/17/2016 Time of notification:  [id number]  Critical value read back:yes  Nurse who received alert:  Roberto Scales, RN  [place] Hollis, [place] notified of hgb.  Orders to transfuse in EPIC.  RN will continue to monitor.

## 2016-09-18 LAB — TYPE AND SCREEN
Blood Product Expiration Date: 201802222359
ISSUE DATE / TIME: 201801291136
Unit Type and Rh: 5100

## 2016-09-19 ENCOUNTER — Other Ambulatory Visit: Payer: Self-pay | Admitting: Family Medicine

## 2016-09-19 DIAGNOSIS — Z1231 Encounter for screening mammogram for malignant neoplasm of breast: Secondary | ICD-10-CM

## 2016-10-18 ENCOUNTER — Encounter: Payer: Self-pay | Admitting: Obstetrics & Gynecology

## 2016-10-22 MED FILL — ?CETIRIZINE HCL 10 MG TABLE: 10 | 30 days supply | Qty: 30 | Fill #1

## 2016-10-22 MED FILL — hydrOXYzine HCL 25 MG TABS: 25 | 7 days supply | Qty: 30 | Fill #2

## 2016-11-06 ENCOUNTER — Encounter: Payer: Self-pay | Admitting: Family Medicine

## 2016-11-06 ENCOUNTER — Ambulatory Visit (INDEPENDENT_AMBULATORY_CARE_PROVIDER_SITE_OTHER): Payer: No Typology Code available for payment source | Admitting: Family Medicine

## 2016-11-06 VITALS — BP 162/90 | HR 86 | Temp 97.6°F | Resp 18 | Ht 61.0 in | Wt 202.0 lb

## 2016-11-06 DIAGNOSIS — M25561 Pain in right knee: Secondary | ICD-10-CM

## 2016-11-06 DIAGNOSIS — D509 Iron deficiency anemia, unspecified: Secondary | ICD-10-CM

## 2016-11-06 DIAGNOSIS — L299 Pruritus, unspecified: Secondary | ICD-10-CM

## 2016-11-06 DIAGNOSIS — I10 Essential (primary) hypertension: Secondary | ICD-10-CM

## 2016-11-06 DIAGNOSIS — G8929 Other chronic pain: Secondary | ICD-10-CM

## 2016-11-06 DIAGNOSIS — J4520 Mild intermittent asthma, uncomplicated: Secondary | ICD-10-CM

## 2016-11-06 LAB — POCT URINALYSIS DIP (DEVICE)
Bilirubin Urine: NEGATIVE
Glucose, UA: NEGATIVE mg/dL
Ketones, ur: NEGATIVE mg/dL
Nitrite: NEGATIVE
PH: 5 (ref 5.0–8.0)
PROTEIN: NEGATIVE mg/dL
SPECIFIC GRAVITY, URINE: 1.015 (ref 1.005–1.030)
UROBILINOGEN UA: 0.2 mg/dL (ref 0.0–1.0)

## 2016-11-06 LAB — COMPLETE METABOLIC PANEL WITH GFR
ALT: 15 U/L (ref 6–29)
AST: 14 U/L (ref 10–35)
Albumin: 3.9 g/dL (ref 3.6–5.1)
Alkaline Phosphatase: 82 U/L (ref 33–130)
BUN: 15 mg/dL (ref 7–25)
CO2: 25 mmol/L (ref 20–31)
CREATININE: 0.63 mg/dL (ref 0.50–1.05)
Calcium: 9.9 mg/dL (ref 8.6–10.4)
Chloride: 106 mmol/L (ref 98–110)
GFR, Est Non African American: >89 mL/min (ref >=60)
GLUCOSE: 79 mg/dL (ref 65–99)
Potassium: 4.1 mmol/L (ref 3.5–5.3)
Sodium: 139 mmol/L (ref 135–146)
TOTAL PROTEIN: 7.1 g/dL (ref 6.1–8.1)
Total Bilirubin: 0.3 mg/dL (ref 0.2–1.2)

## 2016-11-06 LAB — CBC WITH DIFFERENTIAL/PLATELET
BASOS PCT: 1 %
Basophils Absolute: 59 cells/uL (ref 0–200)
EOS ABS: 118 {cells}/uL (ref 15–500)
Eosinophils Relative: 2 %
HCT: 30.8 % — ABNORMAL LOW (ref 35.0–45.0)
Hemoglobin: 9 g/dL — ABNORMAL LOW (ref 11.7–15.5)
Lymphocytes Relative: 32 %
Lymphs Abs: 1888 cells/uL (ref 850–3900)
MCH: 23 pg — ABNORMAL LOW (ref 27.0–33.0)
MCHC: 29.2 g/dL — ABNORMAL LOW (ref 32.0–36.0)
MCV: 78.8 fL — AB (ref 80.0–100.0)
MONO ABS: 472 {cells}/uL (ref 200–950)
Monocytes Relative: 8 %
NEUTROS ABS: 3363 {cells}/uL (ref 1500–7800)
Neutrophils Relative %: 57 %
PLATELETS: 209 10*3/uL (ref 140–400)
RBC: 3.91 MIL/uL (ref 3.80–5.10)
RDW: 24.3 % — ABNORMAL HIGH (ref 11.0–15.0)
WBC: 5.9 10*3/uL (ref 3.8–10.8)

## 2016-11-06 MED ORDER — KETOROLAC TROMETHAMINE 60 MG/2ML IM SOLN
60.0000 mg | Freq: Once | INTRAMUSCULAR | Status: AC
  Administered 2016-11-06: 60 mg via INTRAMUSCULAR

## 2016-11-06 MED ORDER — ALBUTEROL SULFATE HFA 108 (90 BASE) MCG/ACT IN AERS: 2.0000 | INHALATION_SPRAY | Freq: Four times a day (QID) | RESPIRATORY_TRACT | 2 refills | Status: DC | PRN

## 2016-11-06 MED ORDER — HYDROXYZINE HCL 25 MG PO TABS: 25.0000 mg | ORAL_TABLET | Freq: Four times a day (QID) | ORAL | 3 refills | Status: DC | PRN

## 2016-11-06 MED ORDER — MELOXICAM 15 MG PO TABS: 15.0000 mg | ORAL_TABLET | Freq: Every day | ORAL | 0 refills | Status: DC

## 2016-11-06 MED FILL — VENTOLIN HFA 90 MCG INHALER: 108 (90 BAS | 25 days supply | Qty: 18 | Fill #0

## 2016-11-06 MED FILL — MELOXICAM 15 MG TABLET: 15 | 30 days supply | Qty: 30 | Fill #0

## 2016-11-06 MED FILL — hydrOXYzine HCL 25 MG TABS: 25 | 7 days supply | Qty: 30 | Fill #0

## 2016-11-06 NOTE — Progress Notes (Signed)
Subjective:    Patient ID: Caroline Ryan, female    DOB: November 16, 1965, 51 y.o.   MRN: [id number]  HPI  Caroline Ryan, a 51 year old female presents for a 3 month follow up of hypertension and anemia. Patient is primarily complaining of right knee pain. She says that knee pain has been present intermittently over the past several months.   She states that she has not been exercising or following a lowfat, low sodium diet. She reports that she is taking Lisinopril-Hydrochlorothiazide 20-25 mg daily. She currently denies dizziness, syncope, chest pains, heart palpitations, or swelling of lower extremities.   [name] is complaining of right knee pain. Onset of the symptoms was several months ago. Patient has not identified an inciting event.  Current symptoms include stiffness, swelling and pain. She also complains of numbness and tingling to right upper and lower extremities Pain is aggravated by any weight bearing, going up and down stairs, lateral movements and pivoting.  Patient has had prior knee problems. Patient denies having evaluation or treatment to date. She reports that pain intensity is 9/10 described as intermittent and aching. She last had Tylenol this am with minimal relief. Patient presents for presents evaluation of anemia. Previous hemoglobin was 7.1 in January after receiving 1 unit of packed red blood cells at the day infusion center.  Patient reports that fatigue has improved.  She says that she has been taking ferrous sulfate and eating an iron rich diet. She has a history of uterine fibroids periodic heavy menses.  She was referred to gynecology in July, but has not scheduled an appointment. Anemia was found by routine CBC.  It has been present over the past year.   Past Medical History:  Diagnosis Date  . Asthma   . Fibroid   . Headache(784.0)   . Hypertension   . Infection   . Urinary tract infection    Social History   Social History  . Marital status: Single   Spouse name: N/A  . Number of children: N/A  . Years of education: N/A   Occupational History  . Not on file.   Social History Main Topics  . Smoking status: Former Games developer  . Smokeless tobacco: Never Used     Comment: as teenager  . Alcohol use No  . Drug use: No  . Sexual activity: Yes    Birth control/ protection: Condom   Other Topics Concern  . Not on file   Social History Narrative  . No narrative on file   Immunization History  Administered Date(s) Administered  . Influenza Whole 06/30/2009  . Influenza,inj,Quad PF,36+ Mos 06/27/2015, 07/04/2016  . Pneumococcal Polysaccharide-23 04/14/2008, 06/27/2015  . Td 10/18/2005  . Tdap 07/04/2016   Review of Systems  Constitutional: Negative for fatigue and fever.  HENT: Positive for dental problem.   Eyes: Negative for photophobia and visual disturbance.  Respiratory: Positive for cough.   Cardiovascular: Negative.   Gastrointestinal: Negative.   Endocrine: Negative for polydipsia, polyphagia and polyuria.  Genitourinary: Negative.  Negative for vaginal discharge and vaginal pain.  Musculoskeletal: Positive for myalgias (Right knee pain).  Skin: Negative.   Neurological: Negative.  Negative for dizziness, tremors, weakness and numbness.  Hematological: Negative.   Psychiatric/Behavioral: Negative for sleep disturbance and suicidal ideas.       Objective:   Physical Exam  Constitutional: She is oriented to person, place, and time. She appears well-developed and well-nourished.  HENT:  Head: Normocephalic and atraumatic.  Right Ear:  Hearing, tympanic membrane, external ear and ear canal normal.  Left Ear: Hearing, tympanic membrane, external ear and ear canal normal.  Nose: Nose normal.  Mouth/Throat: Abnormal dentition. Dental caries present.  Eyes: Conjunctivae and EOM are normal. Pupils are equal, round, and reactive to light.  Neck: Normal range of motion. Neck supple.  Cardiovascular: Normal rate, regular  rhythm, normal heart sounds and intact distal pulses.   Pulmonary/Chest: Effort normal and breath sounds normal.  Abdominal: Soft. Bowel sounds are normal.  Musculoskeletal:       Right knee: She exhibits decreased range of motion and swelling. She exhibits no erythema.  Neurological: She is alert and oriented to person, place, and time. She has normal reflexes.  Skin: Skin is warm and dry.  Psychiatric: She has a normal mood and affect. Her behavior is normal. Judgment and thought content normal.      BP (!) 162/90 (BP Location: Left Arm, Patient Position: Sitting, Cuff Size: Large)   Pulse 86   Temp 97.6 F (36.4 C) (Oral)   Resp 18   Ht 5\' 1"  (1.549 m)   Wt 202 lb (91.6 kg)   SpO2 100%   BMI 38.17 kg/m  Assessment & Plan:  1. Chronic pain of right knee Pain intensity is 9/10, will receive Toradol injection. Apply warm, moist compresses to right knee 20 minutes 4 times per day as needed. Patient to schedule follow up if right knee pain continues.  - ketorolac (TORADOL) injection 60 mg; Inject 2 mLs (60 mg total) into the muscle once. - meloxicam (MOBIC) 15 MG tablet; Take 1 tablet (15 mg total) by mouth daily.  Dispense: 30 tablet; Refill: 0  2. Iron deficiency anemia, unspecified iron deficiency anemia type - CBC with Differential  3. HYPERTENSION, BENIGN ESSENTIAL - COMPLETE METABOLIC PANEL WITH GFR  4. Mild intermittent asthma, unspecified whether complicated - albuterol (PROVENTIL HFA;VENTOLIN HFA) 108 (90 Base) MCG/ACT inhaler; Inhale 2 puffs into the lungs every 6 (six) hours as needed for wheezing or shortness of breath. Asthma attacks  Dispense: 1 each; Refill: 2  5. Pruritus - hydrOXYzine (ATARAX/VISTARIL) 25 MG tablet; Take 1 tablet (25 mg total) by mouth every 6 (six) hours as needed for itching.  Dispense: 30 tablet; Refill: 3   RTC: 3 months hypertension and anemia Tahlor Berenguer M, FNP  The patient was given clear instructions to go to ER or return to medical  center if symptoms do not improve, worsen or new problems develop. The patient verbalized understanding. Will notify patient with laboratory results.

## 2016-12-10 ENCOUNTER — Other Ambulatory Visit: Payer: Self-pay | Admitting: Family Medicine

## 2016-12-10 DIAGNOSIS — G8929 Other chronic pain: Secondary | ICD-10-CM

## 2016-12-10 DIAGNOSIS — M25561 Pain in right knee: Principal | ICD-10-CM

## 2016-12-10 MED FILL — ?MELOXICAM 15 MG TAB: 15 MG | 30 days supply | Qty: 30 | Fill #0

## 2016-12-10 MED FILL — hydrOXYzine HCL 25 MG TABS: 25 | 7 days supply | Qty: 30 | Fill #1

## 2017-01-07 MED FILL — hydrOXYzine HCL 25 MG TABS: 25 | 7 days supply | Qty: 30 | Fill #2

## 2017-01-07 MED FILL — LISINOPRIL-HCTZ 20-25 MG TA: 20-25 | 30 days supply | Qty: 30 | Fill #1

## 2017-01-07 MED FILL — ?CETIRIZINE HCL 10 MG TABLE: 10 | 30 days supply | Qty: 30 | Fill #2

## 2017-01-30 MED FILL — ?CETIRIZINE HCL 10 MG TABLE: 10 | 30 days supply | Qty: 30 | Fill #3

## 2017-01-30 MED FILL — MONTELUKAST SOD 10 MG TAB: 10 | 30 days supply | Qty: 30 | Fill #2

## 2017-01-30 MED FILL — ?HYDROXYZINE HCL 25 MG TAB: 25 MG | 7 days supply | Qty: 30 | Fill #3

## 2017-02-06 ENCOUNTER — Ambulatory Visit
Admission: RE | Admit: 2017-02-06 | Discharge: 2017-02-06 | Disposition: A | Discharge status: Home / Self Care | Payer: No Typology Code available for payment source | Source: Ambulatory Visit | Attending: Family Medicine | Admitting: Family Medicine

## 2017-02-06 DIAGNOSIS — Z1231 Encounter for screening mammogram for malignant neoplasm of breast: Secondary | ICD-10-CM

## 2017-02-07 ENCOUNTER — Other Ambulatory Visit: Payer: Self-pay | Admitting: Family Medicine

## 2017-02-07 DIAGNOSIS — R928 Other abnormal and inconclusive findings on diagnostic imaging of breast: Secondary | ICD-10-CM

## 2017-02-11 ENCOUNTER — Other Ambulatory Visit (HOSPITAL_COMMUNITY): Payer: Self-pay | Admitting: *Deleted

## 2017-02-11 DIAGNOSIS — R928 Other abnormal and inconclusive findings on diagnostic imaging of breast: Secondary | ICD-10-CM

## 2017-02-21 ENCOUNTER — Ambulatory Visit (HOSPITAL_COMMUNITY)
Admission: RE | Admit: 2017-02-21 | Discharge: 2017-02-21 | Disposition: A | Discharge status: Home / Self Care | Payer: No Typology Code available for payment source | Source: Ambulatory Visit | Attending: Obstetrics and Gynecology | Admitting: Obstetrics and Gynecology

## 2017-02-21 ENCOUNTER — Encounter (HOSPITAL_COMMUNITY): Payer: Self-pay

## 2017-02-21 ENCOUNTER — Ambulatory Visit
Admission: RE | Admit: 2017-02-21 | Discharge: 2017-02-21 | Disposition: A | Discharge status: Home / Self Care | Payer: Self-pay | Source: Ambulatory Visit | Attending: Obstetrics and Gynecology | Admitting: Obstetrics and Gynecology

## 2017-02-21 VITALS — BP 186/110 | Ht 61.0 in | Wt 217.0 lb

## 2017-02-21 DIAGNOSIS — Z01419 Encounter for gynecological examination (general) (routine) without abnormal findings: Secondary | ICD-10-CM

## 2017-02-21 DIAGNOSIS — R928 Other abnormal and inconclusive findings on diagnostic imaging of breast: Secondary | ICD-10-CM

## 2017-02-21 NOTE — Patient Instructions (Signed)
Explained breast self awareness with [name]. Let patient know [facility] will cover Pap smears and HPV typing every 5 years unless has a history of abnormal Pap smears. Referred patient to the [facility] for a right breast diagnostic mammogram per recommendation. Appointment scheduled for Thursday, February 21, 2017 at 1450. Let patient know will follow up with her within the next couple weeks with results of Pap smear by letter or phone. Khamiya Burak verbalized understanding.  [place], Arvil Chaco, RN= 2:15 PM

## 2017-02-21 NOTE — Progress Notes (Signed)
Patient referred to [facility] by the [facility] due to recommending additional imaging of the right breast. Screening mammogram completed 02/06/2017.  Pap Smear: Pap smear completed today. Last Pap smear was 3 years ago and normal per patient. Per patient has no history of an abnormal Pap smear. No Pap smear results are in EPIC.  Physical exam: Breasts Breasts symmetrical. Bilateral breast scars at 12 o'clock from previous surgeries for removal of breast cysts per patient. No nipple retraction bilateral breasts. No nipple discharge bilateral breasts. No lymphadenopathy. No lumps palpated bilateral breasts. No complaints of pain or tenderness on exam. Referred patient to the [facility] for a right breast diagnostic mammogram per recommendation. Appointment scheduled for Thursday, February 21, 2017 at 1450.  Pelvic/Bimanual   Ext Genitalia No lesions, no swelling and no discharge observed on external genitalia.         Vagina Vagina pink and normal texture. No lesions or discharge observed in vagina.          Cervix Cervix is present. Cervix pink and of normal texture. No discharge observed.     Uterus Uterus is present and palpable. Uterus in normal position and normal size.        Adnexae Bilateral ovaries present and palpable. No tenderness on palpation.          Rectovaginal No rectal exam completed today since patient had no rectal complaints. No skin abnormalities observed on exam.    Smoking History: Patient has never smoked.  Patient Navigation: Patient education provided. Access to services provided for patient through [facility] program.   Colorectal Cancer Screening: Per patient has never had a colonoscopy completed. No complaints today. FIT Test given to patient to complete and return to [facility].

## 2017-02-22 ENCOUNTER — Encounter (HOSPITAL_COMMUNITY): Payer: Self-pay | Admitting: *Deleted

## 2017-02-22 ENCOUNTER — Telehealth (HOSPITAL_COMMUNITY): Payer: Self-pay | Admitting: *Deleted

## 2017-02-22 LAB — CYTOLOGY - PAP
Adequacy: ABSENT
DIAGNOSIS: NEGATIVE
HPV (WINDOPATH): NOT DETECTED

## 2017-02-22 NOTE — Telephone Encounter (Signed)
Telephoned patient at home number and advised patient of negative pap smear results. Next pap smear due in five years. Patient voiced understanding.

## 2017-02-25 ENCOUNTER — Other Ambulatory Visit: Payer: Self-pay

## 2017-03-01 ENCOUNTER — Telehealth: Payer: Self-pay

## 2017-03-03 LAB — FECAL OCCULT BLOOD, IMMUNOCHEMICAL: Fecal Occult Bld: NEGATIVE

## 2017-03-06 ENCOUNTER — Encounter (HOSPITAL_COMMUNITY): Payer: Self-pay | Admitting: *Deleted

## 2017-03-06 NOTE — Progress Notes (Signed)
Letter mailed to patient with negative Fit Test results.  

## 2017-03-08 MED FILL — hydrOXYzine HCL 25 MG TABS: 25 | 7 days supply | Qty: 30 | Fill #3

## 2017-03-14 ENCOUNTER — Encounter: Payer: Self-pay | Admitting: Family Medicine

## 2017-03-14 ENCOUNTER — Ambulatory Visit (INDEPENDENT_AMBULATORY_CARE_PROVIDER_SITE_OTHER): Payer: Self-pay | Admitting: Family Medicine

## 2017-03-14 VITALS — BP 132/66 | HR 88 | Temp 98.6°F | Resp 16 | Ht 61.0 in | Wt 212.0 lb

## 2017-03-14 DIAGNOSIS — D509 Iron deficiency anemia, unspecified: Secondary | ICD-10-CM

## 2017-03-14 DIAGNOSIS — I10 Essential (primary) hypertension: Secondary | ICD-10-CM

## 2017-03-14 DIAGNOSIS — M25562 Pain in left knee: Secondary | ICD-10-CM

## 2017-03-14 DIAGNOSIS — M25561 Pain in right knee: Secondary | ICD-10-CM

## 2017-03-14 DIAGNOSIS — N921 Excessive and frequent menstruation with irregular cycle: Secondary | ICD-10-CM

## 2017-03-14 LAB — POCT URINALYSIS DIP (DEVICE)
BILIRUBIN URINE: NEGATIVE
GLUCOSE, UA: NEGATIVE mg/dL
KETONES UR: NEGATIVE mg/dL
Leukocytes, UA: NEGATIVE
Nitrite: NEGATIVE
Protein, ur: NEGATIVE mg/dL
Specific Gravity, Urine: 1.015 (ref 1.005–1.030)
Urobilinogen, UA: 0.2 mg/dL (ref 0.0–1.0)
pH: 5.5 (ref 5.0–8.0)

## 2017-03-14 LAB — COMPLETE METABOLIC PANEL WITH GFR
ALBUMIN: 3.8 g/dL (ref 3.6–5.1)
ALK PHOS: 67 U/L (ref 33–130)
ALT: 11 U/L (ref 6–29)
AST: 12 U/L (ref 10–35)
BUN: 8 mg/dL (ref 7–25)
CALCIUM: 8.9 mg/dL (ref 8.6–10.4)
CO2: 20 mmol/L (ref 20–31)
Chloride: 104 mmol/L (ref 98–110)
Creat: 0.63 mg/dL (ref 0.50–1.05)
GFR, Est African American: >89 mL/min (ref >=60)
Glucose, Bld: 80 mg/dL (ref 65–99)
POTASSIUM: 3.6 mmol/L (ref 3.5–5.3)
SODIUM: 136 mmol/L (ref 135–146)
Total Bilirubin: 0.3 mg/dL (ref 0.2–1.2)
Total Protein: 6.4 g/dL (ref 6.1–8.1)

## 2017-03-14 LAB — CBC WITH DIFFERENTIAL/PLATELET
BASOS ABS: 62 {cells}/uL (ref 0–200)
Basophils Relative: 1 %
EOS PCT: 1 %
Eosinophils Absolute: 62 cells/uL (ref 15–500)
HCT: 20 % — ABNORMAL LOW (ref 35.0–45.0)
HEMOGLOBIN: 5.9 g/dL — AB (ref 11.7–15.5)
LYMPHS ABS: 1922 {cells}/uL (ref 850–3900)
Lymphocytes Relative: 31 %
MCH: 25.9 pg — AB (ref 27.0–33.0)
MCHC: 29.5 g/dL — ABNORMAL LOW (ref 32.0–36.0)
MCV: 87.7 fL (ref 80.0–100.0)
MPV: 10.9 fL (ref 7.5–12.5)
Monocytes Absolute: 558 cells/uL (ref 200–950)
Monocytes Relative: 9 %
NEUTROS PCT: 58 %
Neutro Abs: 3596 cells/uL (ref 1500–7800)
Platelets: 201 10*3/uL (ref 140–400)
RBC: 2.28 MIL/uL — ABNORMAL LOW (ref 3.80–5.10)
RDW: 18.4 % — ABNORMAL HIGH (ref 11.0–15.0)
WBC: 6.2 10*3/uL (ref 3.8–10.8)

## 2017-03-14 MED ORDER — DICLOFENAC SODIUM 75 MG PO TBEC: 75.0000 mg | DELAYED_RELEASE_TABLET | Freq: Two times a day (BID) | ORAL | 1 refills | Status: DC

## 2017-03-14 MED ORDER — KETOROLAC TROMETHAMINE 60 MG/2ML IM SOLN
60.0000 mg | Freq: Once | INTRAMUSCULAR | Status: AC
  Administered 2017-03-14: 60 mg via INTRAMUSCULAR

## 2017-03-14 MED ORDER — DICLOFENAC SODIUM 75 MG PO TBEC: 75.0000 mg | DELAYED_RELEASE_TABLET | Freq: Two times a day (BID) | ORAL | 0 refills | Status: DC

## 2017-03-14 MED FILL — ?DICLOFENAC SOD DR 75 MG TA: 75 | 30 days supply | Qty: 60 | Fill #0

## 2017-03-14 NOTE — Progress Notes (Signed)
Subjective:    Patient ID: Caroline Ryan, female    DOB: 1965/11/11, 51 y.o.   MRN: [id number]  HPI  Ms. Nanetta Wiegman, a 51 year old female presents for a 3 month follow up of hypertension and anemia. Patient is primarily complaining of right knee pain. She says that knee pain has been present intermittently over the past several months.   She states that she has not been exercising or following a lowfat, low sodium diet. She reports that she is taking Lisinopril-Hydrochlorothiazide 20-25 mg daily. She currently denies dizziness, syncope, chest pains, or heart palpitations. She endorses swelling to lower extremities.   Ms. Giannone is complaining of bilateral knee pain. Onset of the symptoms was several months ago. Patient has not identified an inciting event.  Current symptoms include stiffness, swelling and pain. She also complains of numbness and tingling to right upper and lower extremities Pain is aggravated by any weight bearing, going up and down stairs, lateral movements and pivoting.  Patient has had prior knee problems. Patient denies having evaluation or treatment to date. She reports that pain intensity is 5/10 described as intermittent and aching. She says that she has been taking ferrous sulfate and eating an iron rich diet. She has a history of uterine fibroids periodic heavy menses.  She was referred to gynecology in July, but has not scheduled an appointment. Previous anemia was found by routine CBC.  It has been present over the past year.   Past Medical History:  Diagnosis Date  . Asthma   . Fibroid   . Headache(784.0)   . Hypertension   . Infection   . Urinary tract infection    Social History   Social History  . Marital status: Single    Spouse name: N/A  . Number of children: N/A  . Years of education: N/A   Occupational History  . Not on file.   Social History Main Topics  . Smoking status: Former Research scientist (life sciences)  . Smokeless tobacco: Never Used     Comment: as teenager   . Alcohol use No  . Drug use: No  . Sexual activity: Yes    Birth control/ protection: Condom   Other Topics Concern  . Not on file   Social History Narrative  . No narrative on file   Immunization History  Administered Date(s) Administered  . Influenza Whole 06/30/2009  . Influenza,inj,Quad PF,36+ Mos 06/27/2015, 07/04/2016  . Pneumococcal Polysaccharide-23 04/14/2008, 06/27/2015  . Td 10/18/2005  . Tdap 07/04/2016   Review of Systems  Constitutional: Negative for fatigue and fever.  Eyes: Negative for photophobia and visual disturbance.  Cardiovascular: Negative.   Gastrointestinal: Negative.   Endocrine: Negative for polydipsia, polyphagia and polyuria.  Genitourinary: Negative.  Negative for vaginal discharge and vaginal pain.  Musculoskeletal: Positive for arthralgias.       Bilateral knee pain  Skin: Negative.   Neurological: Negative.  Negative for dizziness, tremors, weakness and numbness.  Hematological: Negative.   Psychiatric/Behavioral: Negative for sleep disturbance and suicidal ideas.       Objective:   Physical Exam  Constitutional: She is oriented to person, place, and time. She appears well-developed and well-nourished.  HENT:  Head: Normocephalic and atraumatic.  Right Ear: Hearing, tympanic membrane, external ear and ear canal normal.  Left Ear: Hearing, tympanic membrane, external ear and ear canal normal.  Nose: Nose normal.  Eyes: Pupils are equal, round, and reactive to light. Conjunctivae and EOM are normal.  Neck: Normal range of motion.  Neck supple.  Cardiovascular: Normal rate, regular rhythm, normal heart sounds and intact distal pulses.   Trace bilateral lower extremity edema  Pulmonary/Chest: Effort normal and breath sounds normal.  Abdominal: Soft. Bowel sounds are normal.  Musculoskeletal:       Right knee: She exhibits decreased range of motion and swelling. She exhibits no erythema.  Neurological: She is alert and oriented to  person, place, and time. She has normal reflexes.  Skin: Skin is warm and dry.  Psychiatric: She has a normal mood and affect. Her behavior is normal. Judgment and thought content normal.      BP 132/66 (BP Location: Right Arm, Patient Position: Sitting, Cuff Size: Normal)   Pulse 88   Temp 98.6 F (37 C) (Oral)   Resp 16   Ht 5\' 1"  (1.549 m)   Wt 212 lb (96.2 kg)   LMP 02/17/2017 (Exact Date) Comment: longer than normal  SpO2 100%   BMI 40.06 kg/m  Assessment & Plan:  1. HYPERTENSION, BENIGN ESSENTIAL Blood pressure is at goal on current medication regimen. - Continue medication, monitor blood pressure at home. Continue DASH diet. Reminder to go to the ER if any CP, SOB, nausea, dizziness, severe HA, changes vision/speech, left arm numbness and tingling and jaw pain.  - COMPLETE METABOLIC PANEL WITH GFR  2. Arthralgia of both knees - diclofenac (VOLTAREN) 75 MG EC tablet; Take 1 tablet (75 mg total) by mouth 2 (two) times daily.  Dispense: 60 tablet; Refill: 1 - ketorolac (TORADOL) injection 60 mg; Inject 2 mLs (60 mg total) into the muscle once.  3. Menorrhagia with irregular cycle Sent 2 referrals to gynecology for uterine fibroids and menorrhagia. Patient warrants a gynecology referral for further workup and evaluation. [facility] notify [facility] to schedule an appointment.  - CBC with Differential  4. Iron deficiency anemia, unspecified iron deficiency anemia type - CBC with Differential   RTC: 3 months for hypertension and anemia   [name]  MSN, FNP-C [place] [address] [place], [place] [address] [phone]

## 2017-03-14 NOTE — Patient Instructions (Signed)
Knee pain- Will start a trial of Diclofenac 75 mg twice daily  Will follow up by phone with any abnormal laboratory results    Knee Pain, Adult Many things can cause knee pain. The pain often goes away on its own with time and rest. If the pain does not go away, tests may be done to find out what is causing the pain. Follow these instructions at home: Activity  Rest your knee.  Do not do things that cause pain.  Avoid activities where both feet leave the ground at the same time (high-impact activities). Examples are running, jumping rope, and doing jumping jacks. General instructions  Take medicines only as told by your doctor.  Raise (elevate) your knee when you are resting. Make sure your knee is higher than your heart.  Sleep with a pillow under your knee.  If told, put ice on the knee: ? Put ice in a plastic bag. ? Place a towel between your skin and the bag. ? Leave the ice on for 20 minutes, 2-3 times a day.  Ask your doctor if you should wear an elastic knee support.  Lose weight if you are overweight. Being overweight can make your knee hurt more.  Do not use any tobacco products. These include cigarettes, chewing tobacco, or electronic cigarettes. If you need help quitting, ask your doctor. Smoking may slow down healing. Contact a doctor if:  The pain does not stop.  The pain changes or gets worse.  You have a fever along with knee pain.  Your knee gives out or locks up.  Your knee swells, and becomes worse. Get help right away if:  Your knee feels warm.  You cannot move your knee.  You have very bad knee pain.  You have chest pain.  You have trouble breathing. Summary  Many things can cause knee pain. The pain often goes away on its own with time and rest.  Avoid activities that put stress on your knee. These include running and jumping rope.  Get help right away if you cannot move your knee, or if your knee feels warm, or if you have trouble  breathing. This information is not intended to replace advice given to you by your health care provider. Make sure you discuss any questions you have with your health care provider. Document Released: 11/02/2008 Document Revised: 07/31/2016 Document Reviewed: 07/31/2016 Elsevier Interactive Patient Education  2017 Elsevier Inc. Anemia, Nonspecific Anemia is a condition in which the concentration of red blood cells or hemoglobin in the blood is below normal. Hemoglobin is a substance in red blood cells that carries oxygen to the tissues of the body. Anemia results in not enough oxygen reaching these tissues. What are the causes? Common causes of anemia include:  Excessive bleeding. Bleeding may be internal or external. This includes excessive bleeding from periods (in women) or from the intestine.  Poor nutrition.  Chronic kidney, thyroid, and liver disease.  Bone marrow disorders that decrease red blood cell production.  Cancer and treatments for cancer.  HIV, AIDS, and their treatments.  Spleen problems that increase red blood cell destruction.  Blood disorders.  Excess destruction of red blood cells due to infection, medicines, and autoimmune disorders.  What are the signs or symptoms?  Minor weakness.  Dizziness.  Headache.  Palpitations.  Shortness of breath, especially with exercise.  Paleness.  Cold sensitivity.  Indigestion.  Nausea.  Difficulty sleeping.  Difficulty concentrating. Symptoms may occur suddenly or they may develop slowly. How  is this diagnosed? Additional blood tests are often needed. These help your health care provider determine the best treatment. Your health care provider will check your stool for blood and look for other causes of blood loss. How is this treated? Treatment varies depending on the cause of the anemia. Treatment can include:  Supplements of iron, vitamin G25, or folic acid.  Hormone medicines.  A blood transfusion.  This may be needed if blood loss is severe.  Hospitalization. This may be needed if there is significant continual blood loss.  Dietary changes.  Spleen removal.  Follow these instructions at home: Keep all follow-up appointments. It often takes many weeks to correct anemia, and having your health care provider check on your condition and your response to treatment is very important. Get help right away if:  You develop extreme weakness, shortness of breath, or chest pain.  You become dizzy or have trouble concentrating.  You develop heavy vaginal bleeding.  You develop a rash.  You have bloody or black, tarry stools.  You faint.  You vomit up blood.  You vomit repeatedly.  You have abdominal pain.  You have a fever or persistent symptoms for more than 2-3 days.  You have a fever and your symptoms suddenly get worse.  You are dehydrated. This information is not intended to replace advice given to you by your health care provider. Make sure you discuss any questions you have with your health care provider. Document Released: 09/13/2004 Document Revised: 01/18/2016 Document Reviewed: 01/30/2013 Elsevier Interactive Patient Education  2017 [employer].

## 2017-03-15 ENCOUNTER — Telehealth: Payer: Self-pay | Admitting: Family Medicine

## 2017-03-15 ENCOUNTER — Other Ambulatory Visit: Payer: Self-pay | Admitting: Family Medicine

## 2017-03-15 NOTE — Telephone Encounter (Signed)
Caroline Ryan, a 51 year old female with a history of anemia presented for a follow up on 03/14/2017. Hemoglobin is decreased at 5.9. Notified patient. Patient is scheduled for a transfusion of packed red blood cells on 03/18/2017 at 8:30 am. She is unable to come on today due to transportation constraints.    [name]  MSN, FNP-C [place] [address] [place],  [address] [phone]

## 2017-03-18 ENCOUNTER — Other Ambulatory Visit: Payer: Self-pay | Admitting: Family Medicine

## 2017-03-18 ENCOUNTER — Ambulatory Visit (HOSPITAL_COMMUNITY)
Admission: RE | Admit: 2017-03-18 | Discharge: 2017-03-18 | Disposition: A | Discharge status: Home / Self Care | Payer: Medicaid Other | Source: Ambulatory Visit | Attending: Family Medicine | Admitting: Family Medicine

## 2017-03-18 DIAGNOSIS — D500 Iron deficiency anemia secondary to blood loss (chronic): Secondary | ICD-10-CM | POA: Insufficient documentation

## 2017-03-18 LAB — HEMOGLOBIN AND HEMATOCRIT, BLOOD
HEMATOCRIT: 20.3 % — AB (ref 36.0–46.0)
HEMOGLOBIN: 6.2 g/dL — AB (ref 12.0–15.0)

## 2017-03-18 LAB — PREPARE RBC (CROSSMATCH)

## 2017-03-18 MED ORDER — DIPHENHYDRAMINE HCL 25 MG PO CAPS
25.0000 mg | ORAL_CAPSULE | Freq: Once | ORAL | Status: AC
  Administered 2017-03-18: 25 mg via ORAL
  Filled 2017-03-18: qty 1

## 2017-03-18 MED ORDER — SODIUM CHLORIDE 0.9 % IV SOLN
Freq: Once | INTRAVENOUS | Status: AC
  Administered 2017-03-18: 10:00:00 via INTRAVENOUS

## 2017-03-18 MED ORDER — ACETAMINOPHEN 325 MG PO TABS
650.0000 mg | ORAL_TABLET | Freq: Once | ORAL | Status: AC
  Administered 2017-03-18: 650 mg via ORAL
  Filled 2017-03-18: qty 2

## 2017-03-18 NOTE — Progress Notes (Signed)
Provider: Lorel Monaco   Procedure: 1 unit PRBC's via Peripheral IV   Diagnosis Association: Iron deficiency anemia due to chronic blood loss (D50.0)  Treatment: Patient received 1 units PRBC's via Peripheral IV. Patient tolerated procedure well with no transfusion reaction. Discharge instructions given to patient and patient states an understanding. Patient alert, oriented, and ambulatory at time of discharge.

## 2017-03-18 NOTE — Progress Notes (Signed)
[name], a 51 year old female with a history of anemia had a hemoglobin on 5.9 on 03/14/2017. [name] will receive a unit of packed red blood cells in the day infusion center. She has a history of menorrhagia due to uterine fibroids, she has been referred to gynecology for further workup and evaluation.   [name]  MSN, FNP-C [place] [address] [place], [place] [address] [phone]

## 2017-03-18 NOTE — Progress Notes (Signed)
CRITICAL VALUE ALERT  Critical Value: hgb 6.2 Date & Time Notied:  03/18/2017 09:05  Provider Notified:L. Smith Caroline, Ryan  Orders Received/Actions taken:Orders to transfuse already in computer.

## 2017-03-18 NOTE — Discharge Instructions (Signed)
Blood Transfusion, Adult A blood transfusion is a procedure in which you receive donated blood, including plasma, platelets, and red blood cells, through an IV tube. You may need a blood transfusion because of illness, surgery, or injury. The blood may come from a donor. You may also be able to donate blood for yourself (autologous blood donation) before a surgery if you know that you might require a blood transfusion. The blood given in a transfusion is made up of different types of cells. You may receive:  Red blood cells. These carry oxygen to the cells in the body.  White blood cells. These help you fight infections.  Platelets. These help your blood to clot.  Plasma. This is the liquid part of your blood and it helps with fluid imbalances.  If you have hemophilia or another clotting disorder, you may also receive other types of blood products. Tell a health care provider about:  Any allergies you have.  All medicines you are taking, including vitamins, herbs, eye drops, creams, and over-the-counter medicines.  Any problems you or family members have had with anesthetic medicines.  Any blood disorders you have.  Any surgeries you have had.  Any medical conditions you have, including any recent fever or cold symptoms.  Whether you are pregnant or may be pregnant.  Any previous reactions you have had during a blood transfusion. What are the risks? Generally, this is a safe procedure. However, problems may occur, including:  Having an allergic reaction to something in the donated blood. Hives and itching may be symptoms of this type of reaction.  Fever. This may be a reaction to the white blood cells in the transfused blood. Nausea or chest pain may accompany a fever.  Iron overload. This can happen from having many transfusions.  Transfusion-related acute lung injury (TRALI). This is a rare reaction that causes lung damage. The cause is not known.TRALI can occur within hours  of a transfusion or several days later.  Sudden (acute) or delayed hemolytic reactions. This happens if your blood does not match the cells in your transfusion. Your bodys defense system (immune system) may try to attack the new cells. This complication is rare. The symptoms include fever, chills, nausea, and low back pain or chest pain.  Infection or disease transmission. This is rare.  What happens before the procedure?  You will have a blood test to determine your blood type. This is necessary to know what kind of blood your body will accept and to match it to the donor blood.  If you are going to have a planned surgery, you may be able to do an autologous blood donation. This may be done in case you need to have a transfusion.  If you have had an allergic reaction to a transfusion in the past, you may be given medicine to help prevent a reaction. This medicine may be given to you by mouth or through an IV tube.  You will have your temperature, blood pressure, and pulse monitored before the transfusion.  Follow instructions from your health care provider about eating and drinking restrictions.  Ask your health care provider about: ? Changing or stopping your regular medicines. This is especially important if you are taking diabetes medicines or blood thinners. ? Taking medicines such as aspirin and ibuprofen. These medicines can thin your blood. Do not take these medicines before your procedure if your health care provider instructs you not to. What happens during the procedure?  An IV tube will  be inserted into one of your veins. °· The bag of donated blood will be attached to your IV tube. The blood will then enter through your vein. °· Your temperature, blood pressure, and pulse will be monitored regularly during the transfusion. This monitoring is done to detect early signs of a transfusion reaction. °· If you have any signs or symptoms of a reaction, your transfusion will be stopped  and you may be given medicine. °· When the transfusion is complete, your IV tube will be removed. °· Pressure may be applied to the IV site for a few minutes. °· A bandage (dressing) will be applied. °The procedure may vary among health care providers and hospitals. °What happens after the procedure? °· Your temperature, blood pressure, heart rate, breathing rate, and blood oxygen level will be monitored often. °· Your blood may be tested to see how you are responding to the transfusion. °· You may be warmed with fluids or blankets to maintain a normal body temperature. °Summary °· A blood transfusion is a procedure in which you receive donated blood, including plasma, platelets, and red blood cells, through an IV tube. °· Your temperature, blood pressure, and pulse will be monitored before, during, and after the transfusion. °· Your blood may be tested after the transfusion to see how your body has responded. °This information is not intended to replace advice given to you by your health care provider. Make sure you discuss any questions you have with your health care provider. °Document Released: 08/03/2000 Document Revised: 05/03/2016 Document Reviewed: 05/03/2016 °Elsevier Interactive Patient Education © 2018 Elsevier Inc. ° ° °Blood Transfusion, Adult, Care After °This sheet gives you information about how to care for yourself after your procedure. Your health care provider may also give you more specific instructions. If you have problems or questions, contact your health care provider. °What can I expect after the procedure? °After your procedure, it is common to have: °· Bruising and soreness where the IV tube was inserted. °· Headache. ° °Follow these instructions at home: °· Take over-the-counter and prescription medicines only as told by your health care provider. °· Return to your normal activities as told by your health care provider. °· Follow instructions from your health care provider about how to  take care of your IV insertion site. Make sure you: °? Wash your hands with soap and water before you change your bandage (dressing). If soap and water are not available, use hand sanitizer. °? Change your dressing as told by your health care provider. °· Check your IV insertion site every day for signs of infection. Check for: °? More redness, swelling, or pain. °? More fluid or blood. °? Warmth. °? Pus or a bad smell. °Contact a health care provider if: °· You have more redness, swelling, or pain around the IV insertion site. °· You have more fluid or blood coming from the IV insertion site. °· Your IV insertion site feels warm to the touch. °· You have pus or a bad smell coming from the IV insertion site. °· Your urine turns pink, red, or brown. °· You feel weak after doing your normal activities. °Get help right away if: °· You have signs of a serious allergic or immune system reaction, including: °? Itchiness. °? Hives. °? Trouble breathing. °? Anxiety. °? Chest or lower back pain. °? Fever, flushing, and chills. °? Rapid pulse. °? Rash. °? Diarrhea. °? Vomiting. °? Dark urine. °? Serious headache. °? Dizziness. °? Stiff neck. °? Yellow   coloration of the face or the white parts of the eyes (jaundice). °This information is not intended to replace advice given to you by your health care provider. Make sure you discuss any questions you have with your health care provider. °Document Released: 08/27/2014 Document Revised: 04/04/2016 Document Reviewed: 02/20/2016 °Elsevier Interactive Patient Education © 2018 Elsevier Inc. ° °

## 2017-03-19 LAB — TYPE AND SCREEN
ABO/RH(D): O POS
Antibody Screen: NEGATIVE
Unit division: 0

## 2017-03-19 LAB — BPAM RBC
Blood Product Expiration Date: 201808212359
ISSUE DATE / TIME: 201807301002
UNIT TYPE AND RH: 5100

## 2017-03-20 ENCOUNTER — Telehealth: Payer: Self-pay | Admitting: Family Medicine

## 2017-03-20 ENCOUNTER — Telehealth: Payer: Self-pay

## 2017-03-20 ENCOUNTER — Other Ambulatory Visit: Payer: Self-pay | Admitting: Family Medicine

## 2017-03-20 DIAGNOSIS — N938 Other specified abnormal uterine and vaginal bleeding: Secondary | ICD-10-CM

## 2017-03-20 DIAGNOSIS — N921 Excessive and frequent menstruation with irregular cycle: Secondary | ICD-10-CM

## 2017-03-20 MED ORDER — MEGESTROL ACETATE 40 MG PO TABS: 40.0000 mg | ORAL_TABLET | Freq: Every day | ORAL | 0 refills | Status: DC

## 2017-03-20 MED FILL — MEGESTROL 40 MG TABLET: 40 | 30 days supply | Qty: 30 | Fill #0

## 2017-03-20 NOTE — Telephone Encounter (Signed)
Meds ordered this encounter  Medications  . megestrol (MEGACE) 40 MG tablet    Sig: Take 1 tablet (40 mg total) by mouth daily.    Dispense:  30 tablet    Refill:  0    [name]  MSN, FNP-C [place] [address] [place], [place] [address] [phone]

## 2017-04-12 MED FILL — LISINOPRIL-HCTZ 20-25 MG TA: 20-25 | 30 days supply | Qty: 30 | Fill #2

## 2017-04-15 MED FILL — ?CETIRIZINE HCL 10 MG TABLE: 10 | 30 days supply | Qty: 30 | Fill #4

## 2017-04-18 ENCOUNTER — Telehealth: Payer: Self-pay

## 2017-04-18 DIAGNOSIS — L299 Pruritus, unspecified: Secondary | ICD-10-CM

## 2017-04-18 MED ORDER — HYDROXYZINE HCL 25 MG PO TABS
25.0000 mg | ORAL_TABLET | Freq: Four times a day (QID) | ORAL | 3 refills | Status: DC | PRN
Start: 2017-04-18 — End: 2017-10-11

## 2017-04-18 MED FILL — hydrOXYzine HCL 25 MG TABS: 25 | 7 days supply | Qty: 30 | Fill #0

## 2017-04-18 NOTE — Telephone Encounter (Signed)
Medication refilled

## 2017-04-24 ENCOUNTER — Other Ambulatory Visit: Payer: Self-pay | Admitting: Family Medicine

## 2017-04-24 ENCOUNTER — Telehealth: Payer: Self-pay

## 2017-04-24 DIAGNOSIS — N938 Other specified abnormal uterine and vaginal bleeding: Secondary | ICD-10-CM

## 2017-04-24 MED ORDER — MEGESTROL ACETATE 40 MG PO TABS: 40.0000 mg | ORAL_TABLET | Freq: Every day | ORAL | 0 refills | Status: DC

## 2017-04-24 NOTE — Telephone Encounter (Signed)
Refill for megace sent into pharmacy. Thanks!

## 2017-04-30 ENCOUNTER — Other Ambulatory Visit: Payer: Self-pay | Admitting: Family Medicine

## 2017-04-30 DIAGNOSIS — N938 Other specified abnormal uterine and vaginal bleeding: Secondary | ICD-10-CM

## 2017-04-30 MED FILL — MEGESTROL 40 MG TABLET: 40 | 30 days supply | Qty: 30 | Fill #0

## 2017-04-30 MED FILL — ?DICLOFENAC SOD DR 75 MG TA: 75 | 30 days supply | Qty: 60 | Fill #1

## 2017-04-30 NOTE — Telephone Encounter (Signed)
Refill for megace sent into corrected pharmacy. Thanks!

## 2017-05-20 ENCOUNTER — Encounter: Payer: Self-pay | Admitting: Family Medicine

## 2017-05-20 ENCOUNTER — Observation Stay (HOSPITAL_COMMUNITY)
Admission: AD | Admit: 2017-05-20 | Discharge: 2017-05-21 | Disposition: A | Discharge status: Home / Self Care | Payer: Self-pay | Source: Ambulatory Visit | Attending: Obstetrics & Gynecology | Admitting: Obstetrics & Gynecology

## 2017-05-20 ENCOUNTER — Ambulatory Visit (INDEPENDENT_AMBULATORY_CARE_PROVIDER_SITE_OTHER): Payer: Self-pay | Admitting: Family Medicine

## 2017-05-20 ENCOUNTER — Encounter (HOSPITAL_COMMUNITY): Payer: Self-pay | Admitting: *Deleted

## 2017-05-20 VITALS — BP 140/79 | HR 79 | Temp 98.0°F | Resp 16 | Ht 61.0 in | Wt 212.0 lb

## 2017-05-20 DIAGNOSIS — N920 Excessive and frequent menstruation with regular cycle: Secondary | ICD-10-CM | POA: Diagnosis present

## 2017-05-20 DIAGNOSIS — I10 Essential (primary) hypertension: Secondary | ICD-10-CM | POA: Insufficient documentation

## 2017-05-20 DIAGNOSIS — N939 Abnormal uterine and vaginal bleeding, unspecified: Principal | ICD-10-CM

## 2017-05-20 DIAGNOSIS — Z87891 Personal history of nicotine dependence: Secondary | ICD-10-CM | POA: Insufficient documentation

## 2017-05-20 DIAGNOSIS — N921 Excessive and frequent menstruation with irregular cycle: Secondary | ICD-10-CM

## 2017-05-20 DIAGNOSIS — J45909 Unspecified asthma, uncomplicated: Secondary | ICD-10-CM | POA: Insufficient documentation

## 2017-05-20 DIAGNOSIS — D259 Leiomyoma of uterus, unspecified: Secondary | ICD-10-CM

## 2017-05-20 DIAGNOSIS — M25561 Pain in right knee: Secondary | ICD-10-CM

## 2017-05-20 DIAGNOSIS — D649 Anemia, unspecified: Secondary | ICD-10-CM

## 2017-05-20 DIAGNOSIS — Z79899 Other long term (current) drug therapy: Secondary | ICD-10-CM | POA: Insufficient documentation

## 2017-05-20 DIAGNOSIS — M25562 Pain in left knee: Secondary | ICD-10-CM

## 2017-05-20 DIAGNOSIS — N925 Other specified irregular menstruation: Secondary | ICD-10-CM | POA: Insufficient documentation

## 2017-05-20 DIAGNOSIS — R42 Dizziness and giddiness: Secondary | ICD-10-CM | POA: Insufficient documentation

## 2017-05-20 DIAGNOSIS — D219 Benign neoplasm of connective and other soft tissue, unspecified: Secondary | ICD-10-CM

## 2017-05-20 HISTORY — DX: Anemia, unspecified: D64.9

## 2017-05-20 HISTORY — DX: Personal history of other medical treatment: Z92.89

## 2017-05-20 LAB — CBC WITH DIFFERENTIAL/PLATELET
Basophils Absolute: 28 cells/uL (ref 0–200)
Basophils Relative: 0.4 %
EOS PCT: 1.3 %
Eosinophils Absolute: 91 cells/uL (ref 15–500)
HCT: 21.9 % — ABNORMAL LOW (ref 35.0–45.0)
HEMOGLOBIN: 6.1 g/dL — AB (ref 11.7–15.5)
LYMPHS ABS: 1652 {cells}/uL (ref 850–3900)
MCH: 20.8 pg — ABNORMAL LOW (ref 27.0–33.0)
MCHC: 27.9 g/dL — AB (ref 32.0–36.0)
MCV: 74.7 fL — ABNORMAL LOW (ref 80.0–100.0)
MONOS PCT: 8.3 %
NEUTROS PCT: 66.4 %
Neutro Abs: 4648 cells/uL (ref 1500–7800)
PLATELETS: 155 10*3/uL (ref 140–400)
RBC: 2.93 10*6/uL — AB (ref 3.80–5.10)
RDW: 21 % — AB (ref 11.0–15.0)
Total Lymphocyte: 23.6 %
WBC mixed population: 581 cells/uL (ref 200–950)
WBC: 7 10*3/uL (ref 3.8–10.8)

## 2017-05-20 LAB — CBC
HCT: 25 % — ABNORMAL LOW (ref 36.0–46.0)
Hemoglobin: 6.9 g/dL — CL (ref 12.0–15.0)
MCH: 20.9 pg — ABNORMAL LOW (ref 26.0–34.0)
MCHC: 27.6 g/dL — ABNORMAL LOW (ref 30.0–36.0)
MCV: 75.8 fL — ABNORMAL LOW (ref 78.0–100.0)
PLATELETS: 164 10*3/uL (ref 150–400)
RBC: 3.3 MIL/uL — AB (ref 3.87–5.11)
RDW: 22.8 % — AB (ref 11.5–15.5)
WBC: 7.6 10*3/uL (ref 4.0–10.5)

## 2017-05-20 LAB — POCT URINALYSIS DIP (DEVICE)
Bilirubin Urine: NEGATIVE
GLUCOSE, UA: NEGATIVE mg/dL
Ketones, ur: NEGATIVE mg/dL
LEUKOCYTES UA: NEGATIVE
NITRITE: NEGATIVE
Protein, ur: 100 mg/dL — AB
UROBILINOGEN UA: 0.2 mg/dL (ref 0.0–1.0)
pH: 5.5 (ref 5.0–8.0)

## 2017-05-20 LAB — PREPARE RBC (CROSSMATCH)

## 2017-05-20 LAB — POCT PREGNANCY, URINE: PREG TEST UR: NEGATIVE

## 2017-05-20 LAB — ABO/RH: ABO/RH(D): O POS

## 2017-05-20 MED ORDER — MEGESTROL ACETATE 40 MG PO TABS
40.0000 mg | ORAL_TABLET | Freq: Once | ORAL | Status: DC
  Filled 2017-05-20: qty 1

## 2017-05-20 MED ORDER — FUROSEMIDE 10 MG/ML IJ SOLN
20.0000 mg | Freq: Once | INTRAMUSCULAR | Status: AC
  Administered 2017-05-20: 20 mg via INTRAVENOUS
  Filled 2017-05-20 (×2): qty 2

## 2017-05-20 MED ORDER — MONTELUKAST SODIUM 10 MG PO TABS
10.0000 mg | ORAL_TABLET | Freq: Every day | ORAL | Status: DC
  Administered 2017-05-20: 10 mg via ORAL
  Filled 2017-05-20: qty 1

## 2017-05-20 MED ORDER — ZOLPIDEM TARTRATE 5 MG PO TABS: 5.0000 mg | ORAL_TABLET | Freq: Every evening | ORAL | Status: DC | PRN

## 2017-05-20 MED ORDER — DIPHENHYDRAMINE HCL 25 MG PO CAPS
25.0000 mg | ORAL_CAPSULE | Freq: Four times a day (QID) | ORAL | Status: DC | PRN
  Administered 2017-05-20: 25 mg via ORAL
  Filled 2017-05-20: qty 1

## 2017-05-20 MED ORDER — ACETAMINOPHEN 325 MG PO TABS
650.0000 mg | ORAL_TABLET | Freq: Once | ORAL | Status: AC
  Administered 2017-05-20: 650 mg via ORAL
  Filled 2017-05-20: qty 2

## 2017-05-20 MED ORDER — MEGESTROL ACETATE 40 MG PO TABS
80.0000 mg | ORAL_TABLET | Freq: Every day | ORAL | Status: DC
  Administered 2017-05-20 – 2017-05-21 (×2): 80 mg via ORAL
  Filled 2017-05-20 (×3): qty 2

## 2017-05-20 MED ORDER — SODIUM CHLORIDE 0.9 % IV SOLN
Freq: Once | INTRAVENOUS | Status: AC
  Administered 2017-05-20: 15:00:00 via INTRAVENOUS

## 2017-05-20 MED ORDER — DIPHENHYDRAMINE HCL 25 MG PO CAPS
25.0000 mg | ORAL_CAPSULE | Freq: Once | ORAL | Status: AC
  Administered 2017-05-20: 25 mg via ORAL
  Filled 2017-05-20: qty 1

## 2017-05-20 NOTE — MAU Note (Signed)
Urine sent to lab 

## 2017-05-20 NOTE — Progress Notes (Signed)
Subjective:    Patient ID: Caroline Ryan, female    DOB: 1965-09-27, 51 y.o.   MRN: [id number]  HPI  Caroline Ryan, a 51 year old female with a history of menorrhagia due to multiple uterine fibroids. Patient has been referred to gynecology for further workup and evaluation on several occasions. Patient has not been able to schedule with gynecology due to lack of payer source. Patient was started on Megace greater than 1 month ago, which caused the menstrual cycle to cease. However, she missed 3-4 doses of Megace and menstrual cycle re-started. She says that she has been wearing depends and pads due to heaviness of menstrual cycles. Patient has been treated in the past for symptomatic anemia due to menorrhagia. Patient complains of fatigue. Symptoms began several weeks ago. Sentinal symptom the patient feels fatigue began with: excessive menstrual bleeding. Symptoms of her fatigue have been diffuse soft tissue aches and pains and general malaise.  Patient denies fever, significant change in weight and cold intolerance, constipation and change in hair texture.. Symptoms have gradually worsened. Severity has been moderate.   Past Medical History:  Diagnosis Date  . Asthma   . Fibroid   . Headache(784.0)   . Hypertension   . Infection   . Urinary tract infection    Social History   Social History  . Marital status: Single    Spouse name: N/A  . Number of children: N/A  . Years of education: N/A   Occupational History  . Not on file.   Social History Main Topics  . Smoking status: Former Research scientist (life sciences)  . Smokeless tobacco: Never Used     Comment: as teenager  . Alcohol use No  . Drug use: No  . Sexual activity: Yes    Birth control/ protection: Condom   Other Topics Concern  . Not on file   Social History Narrative  . No narrative on file   Immunization History  Administered Date(s) Administered  . Influenza Whole 06/30/2009  . Influenza,inj,Quad PF,6+ Mos 06/27/2015, 07/04/2016   . Pneumococcal Polysaccharide-23 04/14/2008, 06/27/2015  . Td 10/18/2005  . Tdap 07/04/2016   Review of Systems  Constitutional: Negative for fatigue and fever.  Eyes: Negative for photophobia and visual disturbance.  Cardiovascular: Negative.   Gastrointestinal: Negative.   Endocrine: Negative for polydipsia, polyphagia and polyuria.  Genitourinary: Negative.  Negative for vaginal discharge and vaginal pain.  Skin: Negative.   Neurological: Negative.  Negative for dizziness, tremors, weakness and numbness.  Hematological: Negative.   Psychiatric/Behavioral: Negative for sleep disturbance and suicidal ideas.       Objective:   Physical Exam  Constitutional: She is oriented to person, place, and time. She appears well-developed and well-nourished.  HENT:  Head: Normocephalic and atraumatic.  Right Ear: Hearing, tympanic membrane, external ear and ear canal normal.  Left Ear: Hearing, tympanic membrane, external ear and ear canal normal.  Nose: Nose normal.  Eyes: Pupils are equal, round, and reactive to light. Conjunctivae and EOM are normal.  Neck: Normal range of motion. Neck supple.  Cardiovascular: Normal rate, regular rhythm, normal heart sounds and intact distal pulses.   Trace bilateral lower extremity edema  Pulmonary/Chest: Effort normal and breath sounds normal.  Abdominal: Soft. Bowel sounds are normal.  Neurological: She is alert and oriented to person, place, and time. She has normal reflexes.  Skin: Skin is warm and dry.  Psychiatric: She has a normal mood and affect. Her behavior is normal. Judgment and thought content normal.  BP 140/79 (BP Location: Right Arm, Patient Position: Sitting, Cuff Size: Normal)   Pulse 79   Temp 98 F (36.7 C) (Oral)   Resp 16   Ht 5\' 1"  (1.549 m)   Wt 212 lb (96.2 kg)   SpO2 100%   BMI 40.06 kg/m  Assessment & Plan:   Menorrhagia with irregular cycle Sent 2 referrals to gynecology for uterine fibroids and menorrhagia.  Patient warrants a gynecology referral for further workup and evaluation. Called the [facility] at [facility] to schedule an appointment, I was unsuccessful in scheduling a first available appt. Recommend that patient go to the MAU at [facility].   - CBC with Differential - POCT urinalysis dip (device)  Uterine leiomyoma, unspecified location - CBC with Differential - POCT urinalysis dip (device)   RTC: To schedule follow up after work up at [place]  MSN, [address], [place] [address] [phone]

## 2017-05-20 NOTE — MAU Note (Signed)
Pt presents with c/o excessive VB.  Pt was seen in office today, sent to MAU for further eval.  Pt reports has required 3 blood transfusions in past for heavy VB.  Pt states she's light headed and dizzy for couple days.

## 2017-05-20 NOTE — Patient Instructions (Signed)
Patient will report to the emergency department due to heavy menstrual bleeding.  She has been bleeding heavily for greater than 1 month after starting Megace.  She has been unable to get an appointment after several referrals    Uterine Fibroids Uterine fibroids are tissue masses (tumors). They are also called leiomyomas. They can develop inside of a woman's womb (uterus). They can grow very large. Fibroids are not cancerous (benign). Most fibroids do not require medical treatment. Follow these instructions at home: Keep all follow-up visits as told by your doctor. This is important. Take medicines only as told by your doctor. If you were prescribed a hormone treatment, take the hormone medicines exactly as told. Do not take aspirin. It can cause bleeding. Ask your doctor about taking iron pills and increasing the amount of dark green, leafy vegetables in your diet. These actions can help to boost your blood iron levels. Pay close attention to your period. Tell your doctor about any changes, such as: Increased blood flow. This may require you to use more pads or tampons than usual per month. A change in the number of days that your period lasts per month. A change in symptoms that come with your period, such as back pain or cramping in your belly area (abdomen). Contact a doctor if: You have pain in your back or the area between your hip bones (pelvic area) that is not controlled by medicines. You have pain in your abdomen that is not controlled with medicines. You have an increase in bleeding between and during periods. You soak tampons or pads in a half hour or less. You feel lightheaded. You feel extra tired. You feel weak. Get help right away if: You pass out (faint). You have a sudden increase in pelvic pain. This information is not intended to replace advice given to you by your health care provider. Make sure you discuss any questions you have with your health care  provider. Document Released: 09/08/2010 Document Revised: 04/06/2016 Document Reviewed: 02/02/2014 Elsevier Interactive Patient Education  2018 [employer].  Menorrhagia Menorrhagia is a condition in which menstrual periods are heavy or last longer than normal. With menorrhagia, most periods a woman has may cause enough blood loss and cramping that she becomes unable to take part in her usual activities. What are the causes? Common causes of this condition include:  Noncancerous growths in the uterus (polyps or fibroids).  An imbalance of the estrogen and progesterone hormones.  One of the ovaries not releasing an egg during one or more months.  A problem with the thyroid gland (hypothyroid).  Side effects of having an intrauterine device (IUD).  Side effects of some medicines, such as anti-inflammatory medicines or blood thinners.  A bleeding disorder that stops the blood from clotting normally.  In some cases, the cause of this condition is not known. What are the signs or symptoms? Symptoms of this condition include:  Routinely having to change your pad or tampon every 1-2 hours because it is completely soaked.  Needing to use pads and tampons at the same time because of heavy bleeding.  Needing to wake up to change your pads or tampons during the night.  Passing blood clots larger than 1 inch (2.5 cm) in size.  Having bleeding that lasts for more than 7 days.  Having symptoms of low iron levels (anemia), such as tiredness, fatigue, or shortness of breath.  How is this diagnosed? This condition may be diagnosed based on:  A physical exam.  Your symptoms and menstrual history.  Tests, such as: ? Blood tests to check if you are pregnant or have hormonal changes, a bleeding or thyroid disorder, anemia, or other problems. ? Pap test to check for cancerous changes, infections, or inflammation. ? Endometrial biopsy. This test involves removing a tissue sample from the  lining of the uterus (endometrium) to be examined under a microscope. ? Pelvic ultrasound. This test uses sound waves to create images of your uterus, ovaries, and vagina. The images can show if you have fibroids or other growths. ? Hysteroscopy. For this test, a small telescope is used to look inside your uterus.  How is this treated? Treatment may not be needed for this condition. If it is needed, the best treatment for you will depend on:  Whether you need to prevent pregnancy.  Your desire to have children in the future.  The cause and severity of your bleeding.  Your personal preference.  Medicines are the first step in treatment. You may be treated with:  Hormonal birth control methods. These treatments reduce bleeding during your menstrual period. They include: ? Birth control pills. ? Skin patch. ? Vaginal ring. ? Shots (injections) that you get every 3 months. ? Hormonal IUD (intrauterine device). ? Implants that go under the skin.  Medicines that thicken blood and slow bleeding.  Medicines that reduce swelling, such as ibuprofen.  Medicines that contain an artificial (synthetic) hormone called progestin.  Medicines that make the ovaries stop working for a short time.  Iron supplements to treat anemia.  If medicines do not work, surgery may be done. Surgical options may include:  Dilation and curettage (D&C). In this procedure, your health care provider opens (dilates) your cervix and then scrapes or suctions tissue from the endometrium to reduce menstrual bleeding.  Operative hysteroscopy. In this procedure, a small tube with a light on the end (hysteroscope) is used to view your uterus and help remove polyps that may be causing heavy periods.  Endometrial ablation. This is when various techniques are used to permanently destroy your entire endometrium. After endometrial ablation, most women have little or no menstrual flow. This procedure reduces your ability to  become pregnant.  Endometrial resection. In this procedure, an electrosurgical wire loop is used to remove the endometrium. This procedure reduces your ability to become pregnant.  Hysterectomy. This is surgical removal of the uterus. This is a permanent procedure that stops menstrual periods. Pregnancy is not possible after a hysterectomy.  Follow these instructions at home: Medicines  Take over-the-counter and prescription medicines exactly as told by your health care provider. This includes iron pills.  Do not change or switch medicines without asking your health care provider.  Do not take aspirin or medicines that contain aspirin 1 week before or during your menstrual period. Aspirin may make bleeding worse. General instructions  If you need to change your sanitary pad or tampon more than once every 2 hours, limit your activity until the bleeding stops.  Iron pills can cause constipation. To prevent or treat constipation while you are taking prescription iron supplements, your health care provider may recommend that you: ? Drink enough fluid to keep your urine clear or pale yellow. ? Take over-the-counter or prescription medicines. ? Eat foods that are high in fiber, such as fresh fruits and vegetables, whole grains, and beans. ? Limit foods that are high in fat and processed sugars, such as fried and sweet foods.  Eat well-balanced meals, including foods that are high  in iron. Foods that have a lot of iron include leafy green vegetables, meat, liver, eggs, and whole grain breads and cereals.  Do not try to lose weight until the abnormal bleeding has stopped and your blood iron level is back to normal. If you need to lose weight, work with your health care provider to lose weight safely.  Keep all follow-up visits as told by your health care provider. This is important. Contact a health care provider if:  You soak through a pad or tampon every 1 or 2 hours, and this happens every  time you have a period.  You need to use pads and tampons at the same time because you are bleeding so much.  You have nausea, vomiting, diarrhea, or other problems related to medicines you are taking. Get help right away if:  You soak through more than a pad or tampon in 1 hour.  You pass clots bigger than 1 inch (2.5 cm) wide.  You feel short of breath.  You feel like your heart is beating too fast.  You feel dizzy or faint.  You feel very weak or tired. Summary  Menorrhagia is a condition in which menstrual periods are heavy or last longer than normal.  Treatment will depend on the cause of the condition and may include medicines or procedures.  Take over-the-counter and prescription medicines exactly as told by your health care provider. This includes iron pills.  Get help right away if you have heavy bleeding that soaks through more than a pad or tampon in 1 hour, you are passing large clots, or you feel dizzy, faint or short of breath. This information is not intended to replace advice given to you by your health care provider. Make sure you discuss any questions you have with your health care provider. Document Released: 08/06/2005 Document Revised: 07/30/2016 Document Reviewed: 07/30/2016 Elsevier Interactive Patient Education  2017 Elsevier Inc.  Dysfunctional Uterine Bleeding Dysfunctional uterine bleeding is abnormal bleeding from the uterus. Dysfunctional uterine bleeding includes:  A period that comes earlier or later than usual.  A period that is lighter, heavier, or has blood clots.  Bleeding between periods.  Skipping one or more periods.  Bleeding after sexual intercourse.  Bleeding after menopause.  Follow these instructions at home: Pay attention to any changes in your symptoms. Follow these instructions to help with your condition: Eating and drinking  Eat well-balanced meals. Include foods that are high in iron, such as liver, meat, shellfish,  green leafy vegetables, and eggs.  If you become constipated: ? Drink plenty of water. ? Eat fruits and vegetables that are high in water and fiber, such as spinach, carrots, raspberries, apples, and mango. Medicines  Take over-the-counter and prescription medicines only as told by your health care provider.  Do not change medicines without talking with your health care provider.  Aspirin or medicines that contain aspirin may make the bleeding worse. Do not take those medicines: ? During the week before your period. ? During your period.  If you were prescribed iron pills, take them as told by your health care provider. Iron pills help to replace iron that your body loses because of this condition. Activity  If you need to change your sanitary pad or tampon more than one time every 2 hours: ? Lie in bed with your feet raised (elevated). ? Place a cold pack on your lower abdomen. ? Rest as much as possible until the bleeding stops or slows down.  Do not  try to lose weight until the bleeding has stopped and your blood iron level is back to normal. Other Instructions  For two months, write down: ? When your period starts. ? When your period ends. ? When any abnormal bleeding occurs. ? What problems you notice.  Keep all follow up visits as told by your health care provider. This is important. Contact a health care provider if:  You get light-headed or weak.  You have nausea and vomiting.  You cannot eat or drink without vomiting.  You feel dizzy or have diarrhea while you are taking medicines.  You are taking birth control pills or hormones, and you want to change them or stop taking them. Get help right away if:  You develop a fever or chills.  You need to change your sanitary pad or tampon more than one time per hour.  Your bleeding becomes heavier, or your flow contains clots more often.  You develop pain in your abdomen.  You lose consciousness.  You develop a  rash. This information is not intended to replace advice given to you by your health care provider. Make sure you discuss any questions you have with your health care provider. Document Released: 08/03/2000 Document Revised: 01/12/2016 Document Reviewed: 11/01/2014 Elsevier Interactive Patient Education  [employer].

## 2017-05-20 NOTE — MAU Note (Addendum)
Primary doc sent her here today.  Hx of bleeding since Aug, has not stopped, fibroids noted on [place]. Has been given meds to control bleeding, has not helped.  Little light headed at times.

## 2017-05-20 NOTE — MAU Provider Note (Signed)
History     CSN: [id number]  Arrival date and time: 05/20/17 1124  First Provider Initiated Contact with Patient 05/20/17 1249      Chief Complaint  Patient presents with  . Vaginal Bleeding  . Abdominal Pain   HPI  Caroline Ryan is a 51 y.o. female who presents with vaginal bleeding. Was seen by PCP this morning & sent here for further evaluation. Has had AUB & previously diagnosed with uterine fibroids. Has been taking Megace 40 mg QD & waiting for referred to gynecology. States the megace is not stopping the bleeding. Is not saturating pads or passing clots, bleeding like heavy menses. Has had 3 blood transfusions in the last year d/t anemia r/t her vaginal bleeding. In the last few days reports episodes of dizziness & lightheadedness; no LOC. Denies headache, chest pain, SOB, palpitations.  PMH hypertension on meds.   Past Medical History:  Diagnosis Date  . Asthma   . Fibroid   . Headache(784.0)   . Hypertension   . Infection   . Urinary tract infection     Past Surgical History:  Procedure Laterality Date  . BREAST BIOPSY     US guided core 2009  . BREAST EXCISIONAL BIOPSY     right 1982 left 1981  . BREAST SURGERY     bilateral cysts/benign    Family History  Problem Relation Age of Onset  . Hypertension Father   . Diabetes Brother   . Diabetes Brother   . Other Neg Hx     Social History  Substance Use Topics  . Smoking status: Former Research scientist (life sciences)  . Smokeless tobacco: Never Used     Comment: as teenager  . Alcohol use No    Allergies: No Known Allergies  Prescriptions Prior to Admission  Medication Sig Dispense Refill Last Dose  . albuterol (PROVENTIL HFA;VENTOLIN HFA) 108 (90 Base) MCG/ACT inhaler Inhale 2 puffs into the lungs every 6 (six) hours as needed for wheezing or shortness of breath. Asthma attacks 1 each 2 Taking  . cetirizine (ZYRTEC ALLERGY) 10 MG tablet Take 1 tablet (10 mg total) by mouth daily. 30 tablet 5 Taking  . diclofenac (VOLTAREN) 75  MG EC tablet Take 1 tablet (75 mg total) by mouth 2 (two) times daily. 60 tablet 1 Taking  . ferrous sulfate 325 (65 FE) MG tablet Take 1 tablet (325 mg total) by mouth daily with breakfast. 30 tablet 3 Taking  . gabapentin (NEURONTIN) 100 MG capsule Take 1 capsule (100 mg total) by mouth 3 (three) times daily. (Patient not taking: Reported on 03/14/2017) 90 capsule 0 Not Taking  . hydrOXYzine (ATARAX/VISTARIL) 25 MG tablet Take 1 tablet (25 mg total) by mouth every 6 (six) hours as needed for itching. 30 tablet 3 Taking  . lisinopril-hydrochlorothiazide (PRINZIDE,ZESTORETIC) 20-25 MG tablet Take 1 tablet by mouth daily. 30 tablet 5 Taking  . megestrol (MEGACE) 40 MG tablet Take 1 tablet (40 mg total) by mouth daily. (Patient not taking: Reported on 05/20/2017) 30 tablet 0 Not Taking  . megestrol (MEGACE) 40 MG tablet TAKE 1 TABLET BY MOUTH DAILY. (Patient not taking: Reported on 05/20/2017) 30 tablet 0 Not Taking  . montelukast (SINGULAIR) 10 MG tablet Take 1 tablet (10 mg total) by mouth at bedtime. 30 tablet 3 Taking    Review of Systems  Constitutional: Negative.   Respiratory: Negative for shortness of breath.   Cardiovascular: Negative for chest pain and palpitations.  Gastrointestinal: Positive for abdominal pain.  Genitourinary: Positive for  vaginal bleeding.  Neurological: Positive for dizziness and light-headedness. Negative for syncope and headaches.   Physical Exam   Blood pressure 134/74, pulse 88, temperature 98.2 F (36.8 C), temperature source Oral, resp. rate 18, weight 211 lb 8 oz (95.9 kg), last menstrual period 04/03/2017, SpO2 100 %.  Physical Exam  Nursing note and vitals reviewed. Constitutional: She is oriented to person, place, and time. She appears well-developed and well-nourished. No distress.  HENT:  Head: Normocephalic and atraumatic.  Eyes: Conjunctivae are normal. Right eye exhibits no discharge. Left eye exhibits no discharge. No scleral icterus.  Neck:  Normal range of motion.  Cardiovascular: Normal rate, regular rhythm and normal heart sounds.   No murmur heard. Respiratory: Effort normal. No respiratory distress.  GI: Soft. She exhibits no distension. There is no tenderness.  Genitourinary: Uterus is enlarged. Uterus is not tender. Cervix exhibits no motion tenderness and no friability. There is bleeding (small amount of dark red blood slowly trickling from os) in the vagina.  Neurological: She is alert and oriented to person, place, and time.  Skin: Skin is warm and dry. She is not diaphoretic.  Psychiatric: She has a normal mood and affect. Her behavior is normal. Judgment and thought content normal.    MAU Course  Procedures Results for orders placed or performed during the hospital encounter of 05/20/17 (from the past 24 hour(s))  Pregnancy, urine POC     Status: None   Collection Time: 05/20/17 11:57 AM  Result Value Ref Range   Preg Test, Ur NEGATIVE NEGATIVE  CBC     Status: Abnormal   Collection Time: 05/20/17 12:18 PM  Result Value Ref Range   WBC 7.6 4.0 - 10.5 K/uL   RBC 3.30 (L) 3.87 - 5.11 MIL/uL   Hemoglobin 6.9 (LL) 12.0 - 15.0 g/dL   HCT 25.0 (L) 36.0 - 46.0 %   MCV 75.8 (L) 78.0 - 100.0 fL   MCH 20.9 (L) 26.0 - 34.0 pg   MCHC 27.6 (L) 30.0 - 36.0 g/dL   RDW 22.8 (H) 11.5 - 15.5 %   Platelets 164 150 - 400 K/uL  Type and screen     Status: None   Collection Time: 05/20/17 12:18 PM  Result Value Ref Range   ABO/RH(D) O POS    Antibody Screen NEG    Sample Expiration 05/23/2017     MDM VSS, NAD CBC --hemoglobin 6.9  C/w Dr. Harolyn Rutherford. Will transfuse 3 units PRBC  Assessment and Plan  A: 1. Symptomatic anemia   2. Abnormal uterine bleeding (AUB)   3. Uterine leiomyoma, unspecified location    P: Place in obs bed Transfuse 3 units PRBC -- pt consented Megace 80 mg BID  Jorje Guild 05/20/2017, 12:49 PM

## 2017-05-20 NOTE — H&P (Signed)
History   CSN: [id number]  Arrival date and time: 05/20/17 1124  First Provider Initiated Contact with Patient 05/20/17 1249         Chief Complaint  Patient presents with  . Vaginal Bleeding  . Abdominal Pain   HPI  Caroline Ryan is a 51 y.o. female who presents with vaginal bleeding. Was seen by PCP this morning & sent here for further evaluation. Has had AUB & previously diagnosed with uterine fibroids. Has been taking Megace 40 mg QD & waiting for referred to gynecology. States the megace is not stopping the bleeding. Is not saturating pads or passing clots, bleeding like heavy menses. Has had 3 blood transfusions in the last year d/t anemia r/t her vaginal bleeding. In the last few days reports episodes of dizziness & lightheadedness; no LOC. Denies headache, chest pain, SOB, palpitations.  PMH hypertension on meds.       Past Medical History:  Diagnosis Date  . Asthma   . Fibroid   . Headache(784.0)   . Hypertension   . Infection   . Urinary tract infection          Past Surgical History:  Procedure Laterality Date  . BREAST BIOPSY     US guided core 2009  . BREAST EXCISIONAL BIOPSY     right 1982 left 1981  . BREAST SURGERY     bilateral cysts/benign         Family History  Problem Relation Age of Onset  . Hypertension Father   . Diabetes Brother   . Diabetes Brother   . Other Neg Hx           Social History  Substance Use Topics  . Smoking status: Former Research scientist (life sciences)  . Smokeless tobacco: Never Used     Comment: as teenager  . Alcohol use No    Allergies: No Known Allergies         Prescriptions Prior to Admission  Medication Sig Dispense Refill Last Dose  . albuterol (PROVENTIL HFA;VENTOLIN HFA) 108 (90 Base) MCG/ACT inhaler Inhale 2 puffs into the lungs every 6 (six) hours as needed for wheezing or shortness of breath. Asthma attacks 1 each 2 Taking  . cetirizine (ZYRTEC ALLERGY) 10 MG tablet Take 1 tablet (10 mg  total) by mouth daily. 30 tablet 5 Taking  . diclofenac (VOLTAREN) 75 MG EC tablet Take 1 tablet (75 mg total) by mouth 2 (two) times daily. 60 tablet 1 Taking  . ferrous sulfate 325 (65 FE) MG tablet Take 1 tablet (325 mg total) by mouth daily with breakfast. 30 tablet 3 Taking  . gabapentin (NEURONTIN) 100 MG capsule Take 1 capsule (100 mg total) by mouth 3 (three) times daily. (Patient not taking: Reported on 03/14/2017) 90 capsule 0 Not Taking  . hydrOXYzine (ATARAX/VISTARIL) 25 MG tablet Take 1 tablet (25 mg total) by mouth every 6 (six) hours as needed for itching. 30 tablet 3 Taking  . lisinopril-hydrochlorothiazide (PRINZIDE,ZESTORETIC) 20-25 MG tablet Take 1 tablet by mouth daily. 30 tablet 5 Taking  . megestrol (MEGACE) 40 MG tablet Take 1 tablet (40 mg total) by mouth daily. (Patient not taking: Reported on 05/20/2017) 30 tablet 0 Not Taking  . megestrol (MEGACE) 40 MG tablet TAKE 1 TABLET BY MOUTH DAILY. (Patient not taking: Reported on 05/20/2017) 30 tablet 0 Not Taking  . montelukast (SINGULAIR) 10 MG tablet Take 1 tablet (10 mg total) by mouth at bedtime. 30 tablet 3 Taking    Review of Systems  Constitutional:  Negative.   Respiratory: Negative for shortness of breath.   Cardiovascular: Negative for chest pain and palpitations.  Gastrointestinal: Positive for abdominal pain.  Genitourinary: Positive for vaginal bleeding.  Neurological: Positive for dizziness and light-headedness. Negative for syncope and headaches.   Physical Exam   Blood pressure 134/74, pulse 88, temperature 98.2 F (36.8 C), temperature source Oral, resp. rate 18, weight 211 lb 8 oz (95.9 kg), last menstrual period 04/03/2017, SpO2 100 %.  Physical Exam  Nursing note and vitals reviewed. Constitutional: She is oriented to person, place, and time. She appears well-developed and well-nourished. No distress.  HENT:  Head: Normocephalic and atraumatic.  Eyes: Conjunctivae are normal. Right eye exhibits no  discharge. Left eye exhibits no discharge. No scleral icterus.  Neck: Normal range of motion.  Cardiovascular: Normal rate, regular rhythm and normal heart sounds.   No murmur heard. Respiratory: Effort normal. No respiratory distress.  GI: Soft. She exhibits no distension. There is no tenderness.  Genitourinary: Uterus is enlarged. Uterus is not tender. Cervix exhibits no motion tenderness and no friability. There is bleeding (small amount of dark red blood slowly trickling from os) in the vagina.  Neurological: She is alert and oriented to person, place, and time.  Skin: Skin is warm and dry. She is not diaphoretic.  Psychiatric: She has a normal mood and affect. Her behavior is normal. Judgment and thought content normal.    MAU Course  Procedures LabResultsLast24Hours       Results for orders placed or performed during the hospital encounter of 05/20/17 (from the past 24 hour(s))  Pregnancy, urine POC     Status: None   Collection Time: 05/20/17 11:57 AM  Result Value Ref Range   Preg Test, Ur NEGATIVE NEGATIVE  CBC     Status: Abnormal   Collection Time: 05/20/17 12:18 PM  Result Value Ref Range   WBC 7.6 4.0 - 10.5 K/uL   RBC 3.30 (L) 3.87 - 5.11 MIL/uL   Hemoglobin 6.9 (LL) 12.0 - 15.0 g/dL   HCT 25.0 (L) 36.0 - 46.0 %   MCV 75.8 (L) 78.0 - 100.0 fL   MCH 20.9 (L) 26.0 - 34.0 pg   MCHC 27.6 (L) 30.0 - 36.0 g/dL   RDW 22.8 (H) 11.5 - 15.5 %   Platelets 164 150 - 400 K/uL  Type and screen     Status: None   Collection Time: 05/20/17 12:18 PM  Result Value Ref Range   ABO/RH(D) O POS    Antibody Screen NEG    Sample Expiration 05/23/2017       MDM VSS, NAD CBC --hemoglobin 6.9  C/w Dr. Harolyn Rutherford. Will transfuse 3 units PRBC  Assessment and Plan  A: 1. Symptomatic anemia   2. Abnormal uterine bleeding (AUB)   3. Uterine leiomyoma, unspecified location    P: Place in obs bed Transfuse 3 units PRBC -- pt consented Megace 80 mg  BID  Jorje Guild 05/20/2017, 12:49 PM  Attestation of Attending Supervision of Advanced Practice Provider (PA/CNM/NP): Evaluation and management procedures were performed by the Advanced Practice Provider under my supervision and collaboration.  I have reviewed the Advanced Practice Provider's note and chart, and I agree with the management and plan.  If patient is able to receive all units of pRBCs, she may be able to be discharged later todayif stable.   Verita Schneiders, MD, [place] Attending [place], [facility]

## 2017-05-20 NOTE — Progress Notes (Signed)
CRITICAL VALUE ALERT  Critical Value:  Hgb 6.9  Date & Time Notied: 05/20/2017 @ 4081  Provider Notified:   Yes  Orders Received/Actions taken:

## 2017-05-21 ENCOUNTER — Encounter (HOSPITAL_COMMUNITY): Payer: Self-pay

## 2017-05-21 DIAGNOSIS — D219 Benign neoplasm of connective and other soft tissue, unspecified: Secondary | ICD-10-CM

## 2017-05-21 DIAGNOSIS — D649 Anemia, unspecified: Secondary | ICD-10-CM

## 2017-05-21 DIAGNOSIS — N939 Abnormal uterine and vaginal bleeding, unspecified: Secondary | ICD-10-CM

## 2017-05-21 DIAGNOSIS — D259 Leiomyoma of uterus, unspecified: Secondary | ICD-10-CM

## 2017-05-21 LAB — BPAM RBC
BLOOD PRODUCT EXPIRATION DATE: 201810252359
Blood Product Expiration Date: 201810252359
Blood Product Expiration Date: 201810252359
ISSUE DATE / TIME: 201810011553
ISSUE DATE / TIME: 201810011742
ISSUE DATE / TIME: 201810011956
UNIT TYPE AND RH: 5100
UNIT TYPE AND RH: 5100
Unit Type and Rh: 5100

## 2017-05-21 LAB — CBC
HEMATOCRIT: 30.3 % — AB (ref 36.0–46.0)
HEMOGLOBIN: 9.1 g/dL — AB (ref 12.0–15.0)
MCH: 23.4 pg — ABNORMAL LOW (ref 26.0–34.0)
MCHC: 30 g/dL (ref 30.0–36.0)
MCV: 77.9 fL — ABNORMAL LOW (ref 78.0–100.0)
Platelets: 125 10*3/uL — ABNORMAL LOW (ref 150–400)
RBC: 3.89 MIL/uL (ref 3.87–5.11)
RDW: 21.4 % — ABNORMAL HIGH (ref 11.5–15.5)
WBC: 7.5 10*3/uL (ref 4.0–10.5)

## 2017-05-21 LAB — TYPE AND SCREEN
ABO/RH(D): O POS
ANTIBODY SCREEN: NEGATIVE
UNIT DIVISION: 0
Unit division: 0
Unit division: 0

## 2017-05-21 MED ORDER — FERROUS SULFATE 325 (65 FE) MG PO TABS: 325.0000 mg | ORAL_TABLET | Freq: Two times a day (BID) | ORAL | 3 refills | Status: DC

## 2017-05-21 MED ORDER — DICLOFENAC SODIUM 75 MG PO TBEC: 75.0000 mg | DELAYED_RELEASE_TABLET | Freq: Two times a day (BID) | ORAL | 5 refills | Status: DC

## 2017-05-21 MED ORDER — MEGESTROL ACETATE 40 MG PO TABS: 80.0000 mg | ORAL_TABLET | Freq: Two times a day (BID) | ORAL | 6 refills | Status: DC

## 2017-05-21 MED FILL — MEGESTROL 40 MG TABLET: 40 | 30 days supply | Qty: 120 | Fill #0

## 2017-05-21 MED FILL — ?DICLOFENAC SOD DR 75 MG TA: 75 | 30 days supply | Qty: 60 | Fill #0

## 2017-05-21 MED FILL — FERROUS SULFATE 325 MG TAB: 325 (65 FE) | 30 days supply | Qty: 60 | Fill #0

## 2017-05-21 NOTE — Progress Notes (Signed)
Patient discharged home with family. Discharged teaching, paperwork, home care, prescriptions, and follow-up appts reviewed. No questions at this time.

## 2017-05-21 NOTE — Discharge Instructions (Signed)
Abnormal Uterine Bleeding Abnormal uterine bleeding can affect women at various stages in life, including teenagers, women in their reproductive years, pregnant women, and women who have reached menopause. Several kinds of uterine bleeding are considered abnormal, including:  Bleeding or spotting between periods.  Bleeding after sexual intercourse.  Bleeding that is heavier or more than normal.  Periods that last longer than usual.  Bleeding after menopause.  Many cases of abnormal uterine bleeding are minor and simple to treat, while others are more serious. Any type of abnormal bleeding should be evaluated by your health care provider. Treatment will depend on the cause of the bleeding. Follow these instructions at home: Monitor your condition for any changes. The following actions may help to alleviate any discomfort you are experiencing:  Avoid the use of tampons and douches as directed by your health care provider.  Change your pads frequently.  You should get regular pelvic exams and Pap tests. Keep all follow-up appointments for diagnostic tests as directed by your health care provider. Contact a health care provider if:  Your bleeding lasts more than 1 week.  You feel dizzy at times. Get help right away if:  You pass out.  You are changing pads every 15 to 30 minutes.  You have abdominal pain.  You have a fever.  You become sweaty or weak.  You are passing large blood clots from the vagina.  You start to feel nauseous and vomit. This information is not intended to replace advice given to you by your health care provider. Make sure you discuss any questions you have with your health care provider. Document Released: 08/06/2005 Document Revised: 01/18/2016 Document Reviewed: 03/05/2013 Elsevier Interactive Patient Education  2017 [employer]  Anemia, Nonspecific Anemia is a condition in which the concentration of red blood cells or hemoglobin in the blood is  below normal. Hemoglobin is a substance in red blood cells that carries oxygen to the tissues of the body. Anemia results in not enough oxygen reaching these tissues. What are the causes? Common causes of anemia include:  Excessive bleeding. Bleeding may be internal or external. This includes excessive bleeding from periods (in women) or from the intestine.  Poor nutrition.  Chronic kidney, thyroid, and liver disease.  Bone marrow disorders that decrease red blood cell production.  Cancer and treatments for cancer.  HIV, AIDS, and their treatments.  Spleen problems that increase red blood cell destruction.  Blood disorders.  Excess destruction of red blood cells due to infection, medicines, and autoimmune disorders.  What are the signs or symptoms?  Minor weakness.  Dizziness.  Headache.  Palpitations.  Shortness of breath, especially with exercise.  Paleness.  Cold sensitivity.  Indigestion.  Nausea.  Difficulty sleeping.  Difficulty concentrating. Symptoms may occur suddenly or they may develop slowly. How is this diagnosed? Additional blood tests are often needed. These help your health care provider determine the best treatment. Your health care provider will check your stool for blood and look for other causes of blood loss. How is this treated? Treatment varies depending on the cause of the anemia. Treatment can include:  Supplements of iron, vitamin X38, or folic acid.  Hormone medicines.  A blood transfusion. This may be needed if blood loss is severe.  Hospitalization. This may be needed if there is significant continual blood loss.  Dietary changes.  Spleen removal.  Follow these instructions at home: Keep all follow-up appointments. It often takes many weeks to correct anemia, and having your health  care provider check on your condition and your response to treatment is very important. Get help right away if:  You develop extreme weakness,  shortness of breath, or chest pain.  You become dizzy or have trouble concentrating.  You develop heavy vaginal bleeding.  You develop a rash.  You have bloody or black, tarry stools.  You faint.  You vomit up blood.  You vomit repeatedly.  You have abdominal pain.  You have a fever or persistent symptoms for more than 2-3 days.  You have a fever and your symptoms suddenly get worse.  You are dehydrated. This information is not intended to replace advice given to you by your health care provider. Make sure you discuss any questions you have with your health care provider. Document Released: 09/13/2004 Document Revised: 01/18/2016 Document Reviewed: 01/30/2013 Elsevier Interactive Patient Education  2017 [employer].

## 2017-05-21 NOTE — Discharge Summary (Signed)
Physician Discharge Summary  Patient ID: Caroline Ryan MRN: [id number] DOB/AGE: Oct 08, 1965 51 y.o.  Admit date: 05/20/2017 Discharge date: 05/21/2017  Admission and  Discharge Diagnoses:  Principal Problem:   Symptomatic anemia Active Problems:   Menorrhagia   Fibroids  Discharged Condition: stable  Hospital Course: Patient admitted with symptomatic anemia and bleeding.  Started on Megace, transfused with 3 units pBRCs.  Hemoglobin increased from 6 to 9 accordingly.  Bleeding minimal at time of discharge.  She was deemed stable for discharge with outpatient follow up.  Consults: None  Significant Diagnostic Studies:  Results for orders placed or performed during the hospital encounter of 05/20/17 (from the past 72 hour(s))  Pregnancy, urine POC     Status: None   Collection Time: 05/20/17 11:57 AM  Result Value Ref Range   Preg Test, Ur NEGATIVE NEGATIVE    Comment:        THE SENSITIVITY OF THIS METHODOLOGY IS >24 mIU/mL   CBC     Status: Abnormal   Collection Time: 05/20/17 12:18 PM  Result Value Ref Range   WBC 7.6 4.0 - 10.5 K/uL   RBC 3.30 (L) 3.87 - 5.11 MIL/uL   Hemoglobin 6.9 (LL) 12.0 - 15.0 g/dL    Comment: REPEATED TO VERIFY CRITICAL RESULT CALLED TO, READ BACK BY AND VERIFIED WITH: MORRIS,F AT 1310 ON 05/20/2017 BY SAINVILUS,S.    HCT 25.0 (L) 36.0 - 46.0 %   MCV 75.8 (L) 78.0 - 100.0 fL   MCH 20.9 (L) 26.0 - 34.0 pg   MCHC 27.6 (L) 30.0 - 36.0 g/dL   RDW 22.8 (H) 11.5 - 15.5 %   Platelets 164 150 - 400 K/uL  Type and screen     Status: None   Collection Time: 05/20/17 12:18 PM  Result Value Ref Range   ABO/RH(D) O POS    Antibody Screen NEG    Sample Expiration 05/23/2017    Unit Number [id number]    Blood Component Type RED CELLS,LR    Unit division 00    Status of Unit ISSUED,FINAL    Transfusion Status OK TO TRANSFUSE    Crossmatch Result Compatible    Unit Number [id number]    Blood Component Type RED CELLS,LR    Unit division 00    Status of Unit ISSUED,FINAL    Transfusion Status OK TO TRANSFUSE    Crossmatch Result Compatible    Unit Number [id number]    Blood Component Type RED CELLS,LR    Unit division 00    Status of Unit ISSUED,FINAL    Transfusion Status OK TO TRANSFUSE    Crossmatch Result Compatible   ABO/Rh     Status: None   Collection Time: 05/20/17 12:18 PM  Result Value Ref Range   ABO/RH(D) O POS   Prepare RBC     Status: None   Collection Time: 05/20/17  4:00 PM  Result Value Ref Range   Order Confirmation ORDER PROCESSED BY BLOOD BANK   CBC     Status: Abnormal   Collection Time: 05/21/17  7:26 AM  Result Value Ref Range   WBC 7.5 4.0 - 10.5 K/uL   RBC 3.89 3.87 - 5.11 MIL/uL   Hemoglobin 9.1 (L) 12.0 - 15.0 g/dL    Comment: REPEATED TO VERIFY DELTA CHECK NOTED POST TRANSFUSION SPECIMEN    HCT 30.3 (L) 36.0 - 46.0 %   MCV 77.9 (L) 78.0 - 100.0 fL   MCH 23.4 (L) 26.0 - 34.0 pg   MCHC  30.0 30.0 - 36.0 g/dL   RDW 21.4 (H) 11.5 - 15.5 %   Platelets 125 (L) 150 - 400 K/uL    Discharge Exam: Blood pressure 132/69, pulse 72, temperature 98.1 F (36.7 C), temperature source Oral, resp. rate 18, height 5\' 1"  (1.549 m), weight 211 lb (95.7 kg), last menstrual period 04/03/2017, SpO2 100 %. General appearance: alert and no distress Resp: clear to auscultation bilaterally Cardio: regular rate and rhythm GI: soft, non-tender; bowel sounds normal; no masses,  no organomegaly Pelvic: minimal blood on pad Extremities: extremities normal, atraumatic, no cyanosis or edema and Homans sign is negative, no sign of DVT Pulses: 2+ and symmetric Skin: Skin color, texture, turgor normal. No rashes or lesions  Disposition: 01-Home or Self Care   Allergies as of 05/21/2017   No Known Allergies     Medication List    TAKE these medications   albuterol 108 (90 Base) MCG/ACT inhaler Commonly known as:  PROVENTIL HFA;VENTOLIN HFA Inhale 2 puffs into the lungs every 6 (six) hours as needed for  wheezing or shortness of breath. Asthma attacks   cetirizine 10 MG tablet Commonly known as:  ZYRTEC ALLERGY Take 1 tablet (10 mg total) by mouth daily.   diclofenac 75 MG EC tablet Commonly known as:  VOLTAREN Take 1 tablet (75 mg total) by mouth 2 (two) times daily.   ferrous sulfate 325 (65 FE) MG tablet Take 1 tablet (325 mg total) by mouth 2 (two) times daily with a meal. What changed:  when to take this   gabapentin 100 MG capsule Commonly known as:  NEURONTIN Take 1 capsule (100 mg total) by mouth 3 (three) times daily.   hydrOXYzine 25 MG tablet Commonly known as:  ATARAX/VISTARIL Take 1 tablet (25 mg total) by mouth every 6 (six) hours as needed for itching.   lisinopril-hydrochlorothiazide 20-25 MG tablet Commonly known as:  PRINZIDE,ZESTORETIC Take 1 tablet by mouth daily.   megestrol 40 MG tablet Commonly known as:  MEGACE Take 2 tablets (80 mg total) by mouth 2 (two) times daily. What changed:  how much to take  when to take this  Another medication with the same name was removed. Continue taking this medication, and follow the directions you see here.   montelukast 10 MG tablet Commonly known as:  SINGULAIR Take 1 tablet (10 mg total) by mouth at bedtime.      Follow-up [place] for [facility] Follow up on 06/05/2017.   Specialty:  Obstetrics and Gynecology Why:  1:20 pm with Dr. Gates Rigg information: [place] [phone]          Signed: Verita Schneiders 05/21/2017, 10:04 AM

## 2017-06-05 ENCOUNTER — Encounter: Payer: Self-pay | Admitting: Obstetrics and Gynecology

## 2017-06-05 ENCOUNTER — Other Ambulatory Visit (HOSPITAL_COMMUNITY)
Admission: RE | Admit: 2017-06-05 | Discharge: 2017-06-05 | Disposition: A | Discharge status: Home / Self Care | Payer: Self-pay | Source: Ambulatory Visit | Attending: Obstetrics and Gynecology | Admitting: Obstetrics and Gynecology

## 2017-06-05 ENCOUNTER — Ambulatory Visit (INDEPENDENT_AMBULATORY_CARE_PROVIDER_SITE_OTHER): Payer: Self-pay | Admitting: Obstetrics and Gynecology

## 2017-06-05 VITALS — BP 126/84 | HR 88 | Ht 61.0 in | Wt 217.0 lb

## 2017-06-05 DIAGNOSIS — N920 Excessive and frequent menstruation with regular cycle: Secondary | ICD-10-CM | POA: Insufficient documentation

## 2017-06-05 DIAGNOSIS — D219 Benign neoplasm of connective and other soft tissue, unspecified: Secondary | ICD-10-CM

## 2017-06-05 LAB — POCT PREGNANCY, URINE: Preg Test, Ur: NEGATIVE

## 2017-06-05 NOTE — Patient Instructions (Signed)
Endometrial Biopsy, Care After This sheet gives you information about how to care for yourself after your procedure. Your health care provider may also give you more specific instructions. If you have problems or questions, contact your health care provider. What can I expect after the procedure? After the procedure, it is common to have:  Mild cramping.  A small amount of vaginal bleeding for a few days. This is normal.  Follow these instructions at home:  Take over-the-counter and prescription medicines only as told by your health care provider.  Do not douche, use tampons, or have sexual intercourse until your health care provider approves.  Return to your normal activities as told by your health care provider. Ask your health care provider what activities are safe for you.  Follow instructions from your health care provider about any activity restrictions, such as restrictions on strenuous exercise or heavy lifting. Contact a health care provider if:  You have heavy bleeding, or bleed for longer than 2 days after the procedure.  You have bad smelling discharge from your vagina.  You have a fever or chills.  You have a burning sensation when urinating or you have difficulty urinating.  You have severe pain in your lower abdomen. Get help right away if:  You have severe cramps in your stomach or back.  You pass large blood clots.  Your bleeding increases.  You become weak or light-headed, or you pass out. Summary  After the procedure, it is common to have mild cramping and a small amount of vaginal bleeding for a few days.  Do not douche, use tampons, or have sexual intercourse until your health care provider approves.  Return to your normal activities as told by your health care provider. Ask your health care provider what activities are safe for you. This information is not intended to replace advice given to you by your health care provider. Make sure you discuss any  questions you have with your health care provider. Document Released: 05/27/2013 Document Revised: 08/22/2016 Document Reviewed: 08/22/2016 Elsevier Interactive Patient Education  2017 [place]. Menorrhagia Menorrhagia is a condition in which menstrual periods are heavy or last longer than normal. With menorrhagia, most periods a woman has may cause enough blood loss and cramping that she becomes unable to take part in her usual activities. What are the causes? Common causes of this condition include:  Noncancerous growths in the uterus (polyps or fibroids).  An imbalance of the estrogen and progesterone hormones.  One of the ovaries not releasing an egg during one or more months.  A problem with the thyroid gland (hypothyroid).  Side effects of having an intrauterine device (IUD).  Side effects of some medicines, such as anti-inflammatory medicines or blood thinners.  A bleeding disorder that stops the blood from clotting normally.  In some cases, the cause of this condition is not known. What are the signs or symptoms? Symptoms of this condition include:  Routinely having to change your pad or tampon every 1-2 hours because it is completely soaked.  Needing to use pads and tampons at the same time because of heavy bleeding.  Needing to wake up to change your pads or tampons during the night.  Passing blood clots larger than 1 inch (2.5 cm) in size.  Having bleeding that lasts for more than 7 days.  Having symptoms of low iron levels (anemia), such as tiredness, fatigue, or shortness of breath.  How is this diagnosed? This condition may be diagnosed based on:  A physical exam.  Your symptoms and menstrual history.  Tests, such as: ? Blood tests to check if you are pregnant or have hormonal changes, a bleeding or thyroid disorder, anemia, or other problems. ? Pap test to check for cancerous changes, infections, or inflammation. ? Endometrial biopsy. This test  involves removing a tissue sample from the lining of the uterus (endometrium) to be examined under a microscope. ? Pelvic ultrasound. This test uses sound waves to create images of your uterus, ovaries, and vagina. The images can show if you have fibroids or other growths. ? Hysteroscopy. For this test, a small telescope is used to look inside your uterus.  How is this treated? Treatment may not be needed for this condition. If it is needed, the best treatment for you will depend on:  Whether you need to prevent pregnancy.  Your desire to have children in the future.  The cause and severity of your bleeding.  Your personal preference.  Medicines are the first step in treatment. You may be treated with:  Hormonal birth control methods. These treatments reduce bleeding during your menstrual period. They include: ? Birth control pills. ? Skin patch. ? Vaginal ring. ? Shots (injections) that you get every 3 months. ? Hormonal IUD (intrauterine device). ? Implants that go under the skin.  Medicines that thicken blood and slow bleeding.  Medicines that reduce swelling, such as ibuprofen.  Medicines that contain an artificial (synthetic) hormone called progestin.  Medicines that make the ovaries stop working for a short time.  Iron supplements to treat anemia.  If medicines do not work, surgery may be done. Surgical options may include:  Dilation and curettage (D&C). In this procedure, your health care provider opens (dilates) your cervix and then scrapes or suctions tissue from the endometrium to reduce menstrual bleeding.  Operative hysteroscopy. In this procedure, a small tube with a light on the end (hysteroscope) is used to view your uterus and help remove polyps that may be causing heavy periods.  Endometrial ablation. This is when various techniques are used to permanently destroy your entire endometrium. After endometrial ablation, most women have little or no menstrual  flow. This procedure reduces your ability to become pregnant.  Endometrial resection. In this procedure, an electrosurgical wire loop is used to remove the endometrium. This procedure reduces your ability to become pregnant.  Hysterectomy. This is surgical removal of the uterus. This is a permanent procedure that stops menstrual periods. Pregnancy is not possible after a hysterectomy.  Follow these instructions at home: Medicines  Take over-the-counter and prescription medicines exactly as told by your health care provider. This includes iron pills.  Do not change or switch medicines without asking your health care provider.  Do not take aspirin or medicines that contain aspirin 1 week before or during your menstrual period. Aspirin may make bleeding worse. General instructions  If you need to change your sanitary pad or tampon more than once every 2 hours, limit your activity until the bleeding stops.  Iron pills can cause constipation. To prevent or treat constipation while you are taking prescription iron supplements, your health care provider may recommend that you: ? Drink enough fluid to keep your urine clear or pale yellow. ? Take over-the-counter or prescription medicines. ? Eat foods that are high in fiber, such as fresh fruits and vegetables, whole grains, and beans. ? Limit foods that are high in fat and processed sugars, such as fried and sweet foods.  Eat well-balanced meals, including  foods that are high in iron. Foods that have a lot of iron include leafy green vegetables, meat, liver, eggs, and whole grain breads and cereals.  Do not try to lose weight until the abnormal bleeding has stopped and your blood iron level is back to normal. If you need to lose weight, work with your health care provider to lose weight safely.  Keep all follow-up visits as told by your health care provider. This is important. Contact a health care provider if:  You soak through a pad or  tampon every 1 or 2 hours, and this happens every time you have a period.  You need to use pads and tampons at the same time because you are bleeding so much.  You have nausea, vomiting, diarrhea, or other problems related to medicines you are taking. Get help right away if:  You soak through more than a pad or tampon in 1 hour.  You pass clots bigger than 1 inch (2.5 cm) wide.  You feel short of breath.  You feel like your heart is beating too fast.  You feel dizzy or faint.  You feel very weak or tired. Summary  Menorrhagia is a condition in which menstrual periods are heavy or last longer than normal.  Treatment will depend on the cause of the condition and may include medicines or procedures.  Take over-the-counter and prescription medicines exactly as told by your health care provider. This includes iron pills.  Get help right away if you have heavy bleeding that soaks through more than a pad or tampon in 1 hour, you are passing large clots, or you feel dizzy, faint or short of breath. This information is not intended to replace advice given to you by your health care provider. Make sure you discuss any questions you have with your health care provider. Document Released: 08/06/2005 Document Revised: 07/30/2016 Document Reviewed: 07/30/2016 Elsevier Interactive Patient Education  2017 [employer].

## 2017-06-05 NOTE — Progress Notes (Signed)
Pt stated still bleeding(light) and pressure in the bladder. Pt is taking Rx megesterol bid.  Caroline Ryan is here for hospital follow up for her menorrhagia and uterine fibroids.  Pt was admitted from 10/1-10/2 with above plus Sx amenia. She was transfused and placed on Megace Bid.  She reports that the bleeding has slowed down but still spotting.  She denies any lightheadedness now  Pt with a known H/O uterine fibroids. Dx in 2017.   TSVD x 3 largerst 7 # 10 oz  Asthma/HTN followed by PCP and controlled.  ROS: Denies HA or visual changes            Denies CP           Denies SOB           Denies any bowel or bladder dysfunction except for some bladder pressure  PE AF VSS Lungs clear Heart RRR Abd soft + BS  GU Nl EGBUS scant bleeding noted uterus 12-14 weeks mobile   ENDOMETRIAL BIOPSY     The indications for endometrial biopsy were reviewed.   Risks of the biopsy including cramping, bleeding, infection, uterine perforation, inadequate specimen and need for additional procedures  were discussed. The patient states she understands and agrees to undergo procedure today. Consent was signed. Time out was performed.  During the pelvic exam, the cervix was prepped with Betadine. A single-toothed tenaculum was placed on the anterior lip of the cervix to stabilize it. The 3 mm pipelle was introduced into the endometrial cavity without difficulty to a depth of 11cm, and a moderate amount of tissue was obtained and sent to pathology. The instruments were removed from the patient's vagina. Minimal bleeding from the cervix was noted. The patient tolerated the procedure well. Routine post-procedure instructions were given to the patient.     A/P Menorrhagia        Uterine Fibroids  EMBX completed. GYN U/S ordered. Menorrhagia and uterine fibroids reviewed with pt. Tx options briefly discussed. Will increase Megace to tid and F/U in 4 weeks

## 2017-06-11 ENCOUNTER — Ambulatory Visit (HOSPITAL_COMMUNITY)
Admission: RE | Admit: 2017-06-11 | Discharge: 2017-06-11 | Disposition: A | Discharge status: Home / Self Care | Payer: Self-pay | Source: Ambulatory Visit | Attending: Obstetrics and Gynecology | Admitting: Obstetrics and Gynecology

## 2017-06-11 DIAGNOSIS — D219 Benign neoplasm of connective and other soft tissue, unspecified: Secondary | ICD-10-CM | POA: Insufficient documentation

## 2017-06-11 DIAGNOSIS — N920 Excessive and frequent menstruation with regular cycle: Secondary | ICD-10-CM | POA: Insufficient documentation

## 2017-06-11 IMAGING — US US PELVIS COMPLETE TRANSABD/TRANSVAG
1 series · 15 of 25 positions shown · non-contrast
Comparison: None

CLINICAL DATA: Menorrhagia with regular cycle, uterine fibroids

EXAM:
TRANSABDOMINAL AND TRANSVAGINAL ULTRASOUND OF PELVIS
TECHNIQUE: Both transabdominal and transvaginal ultrasound examinations of the
pelvis were performed. Transabdominal technique was performed for
global imaging of the pelvis including uterus, ovaries, adnexal
regions, and pelvic cul-de-sac. It was necessary to proceed with
endovaginal exam following the transabdominal exam to visualize the
endometrial complex and ovaries.

[Series 1: us pelvis complete transabd/transvag · 87 acquisitions, 15 frames shown]
[im 1/87]
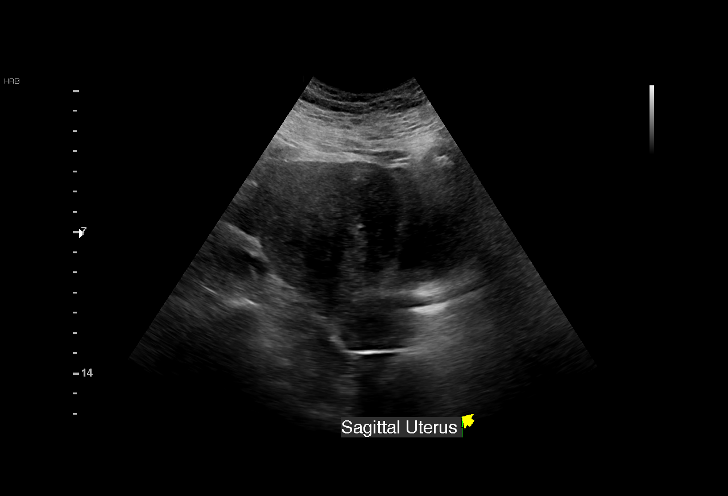
[im 8/87]
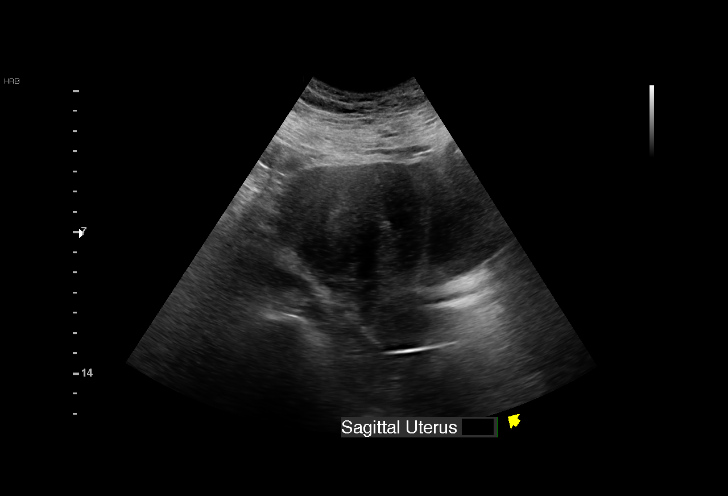
[im 15/87]
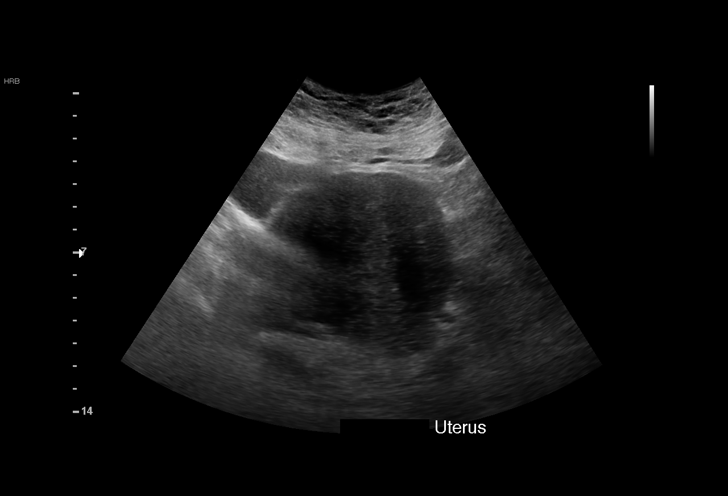
[im 18/87]
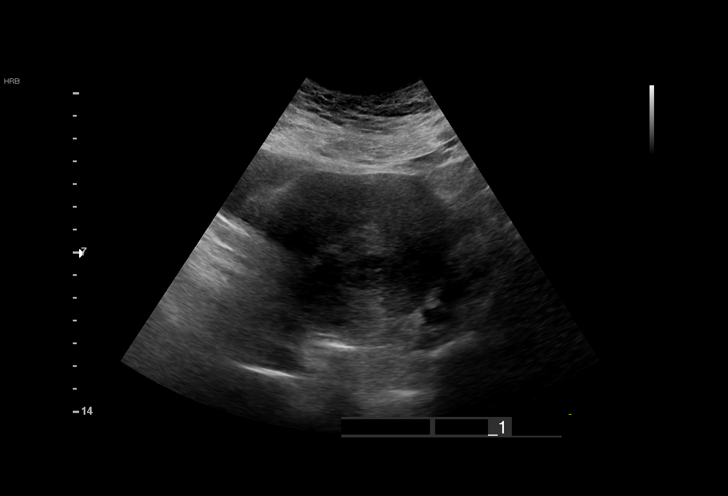
[im 26/87]
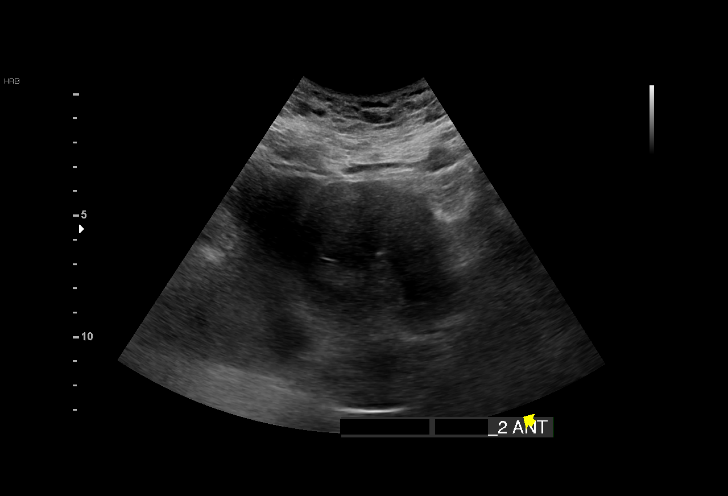
[im 33/87]
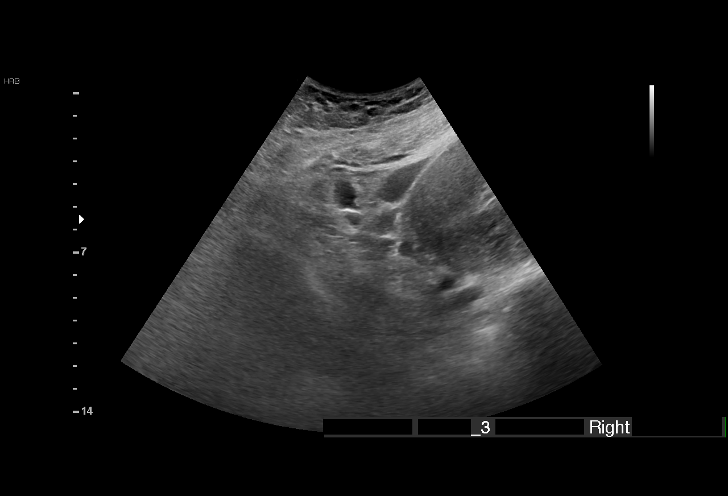
[im 36/87]
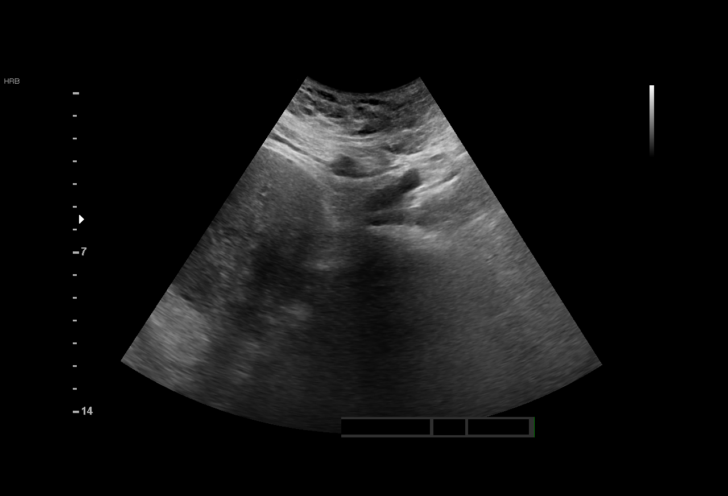
[im 44/87]
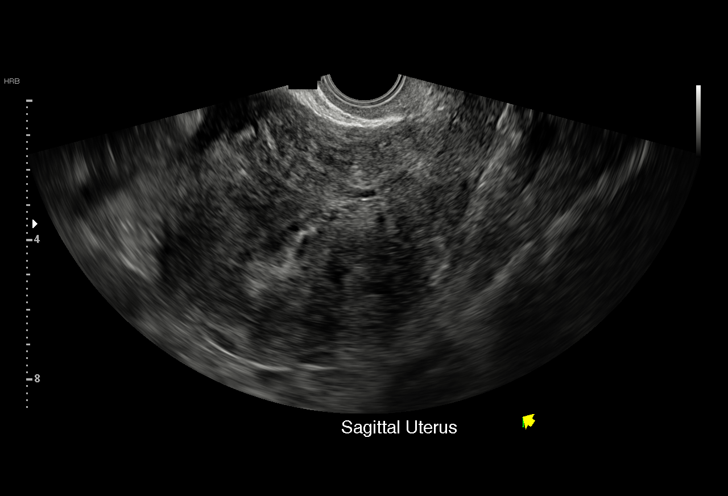
[im 51/87]
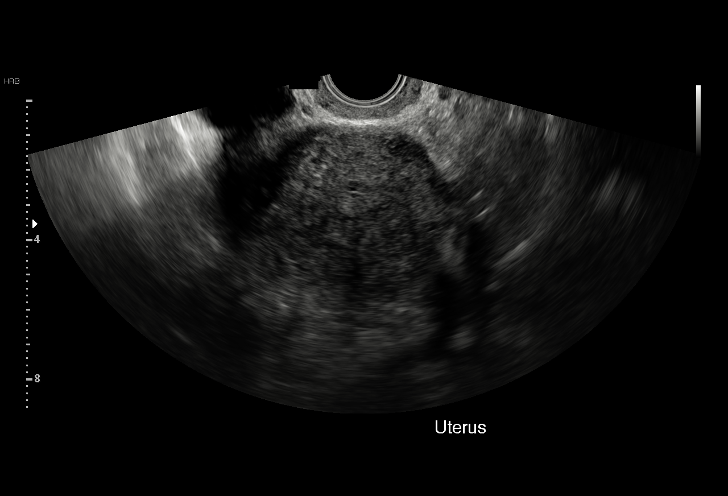
[im 54/87]
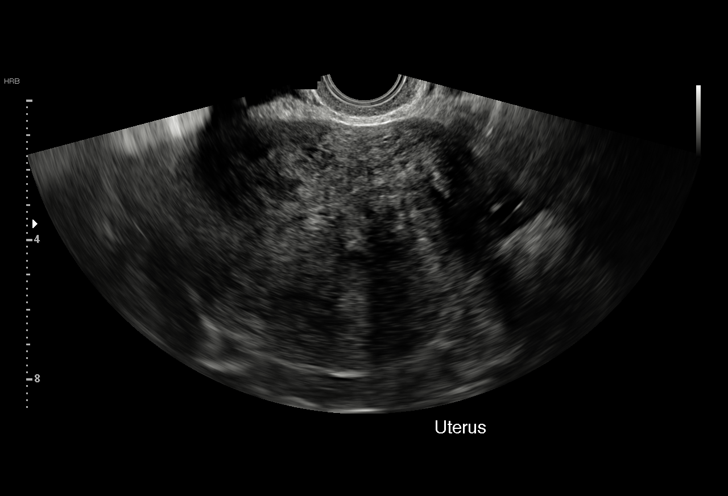
[im 61/87]
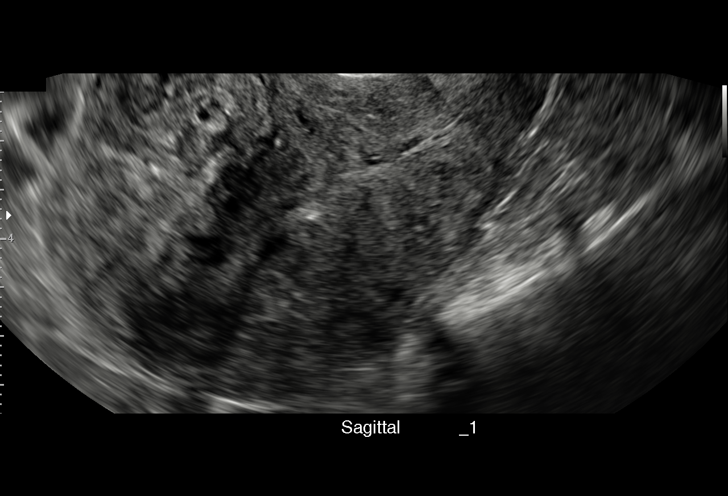
[im 69/87]
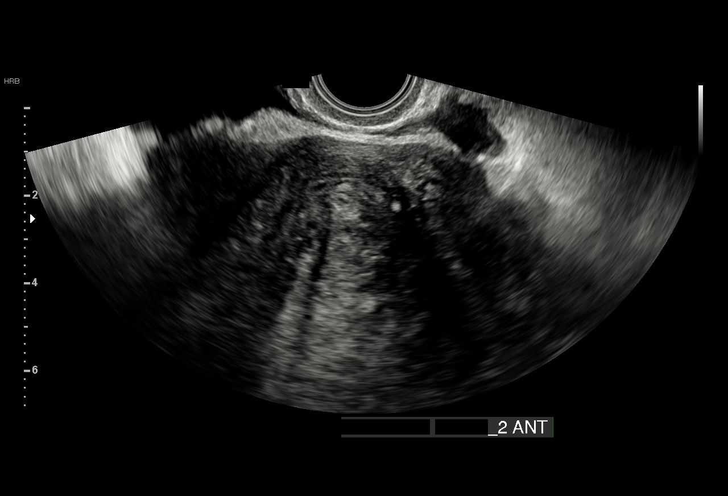
[im 72/87]
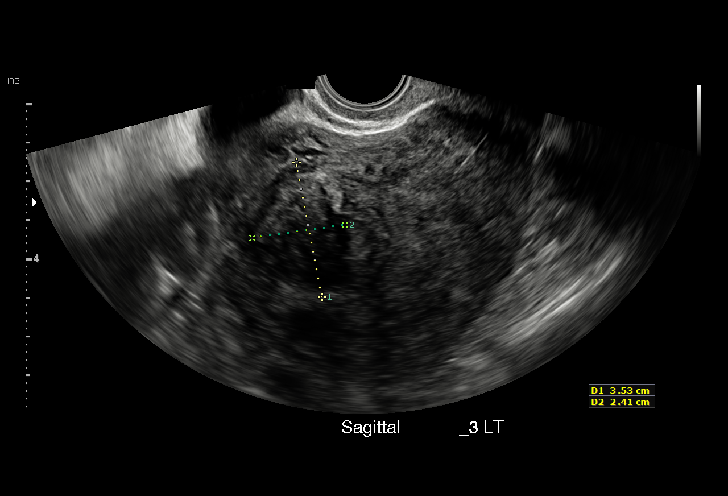
[im 79/87]
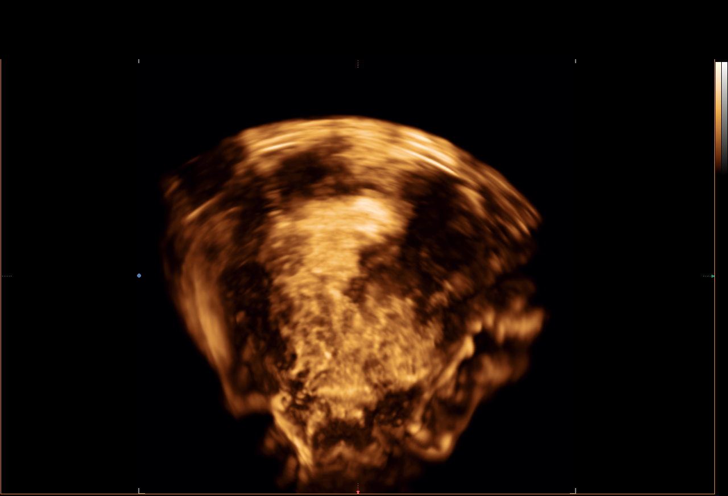
[im 87/87]
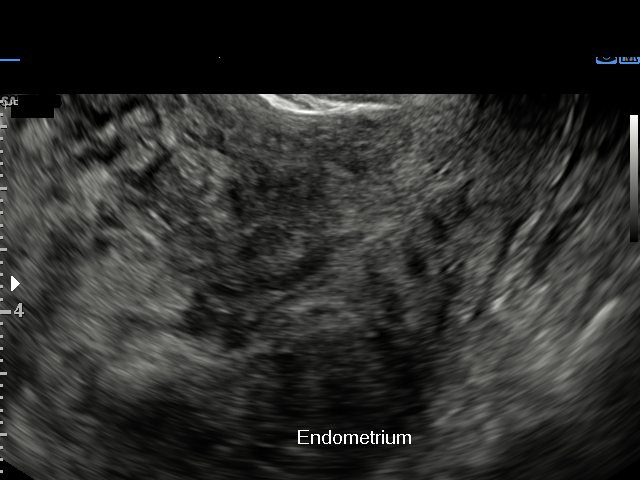

[15 of 25 positions shown; findings below may reference images not displayed]

FINDINGS: Uterus

Measurements: 10.3 x 7.3 x 8.9 cm. Multiple uterine leiomyomata
including a small submucosal leiomyoma at the mid uterus 19 x 15 by
15 mm, and anterior intramural leiomyoma at upper uterine segment 33
x 28 x 28 mm, and a LEFT lateral leiomyoma at the upper uterine
segment 35 x 24 x 25 mm which extends submucosal.

Endometrium

Thickness: 17 mm thick. No definite endometrial fluid or focal
abnormality

Right ovary

Not visualized on either transabdominal or endovaginal imaging
question obscured by bowel and enlarged uterus

Left ovary

Not visualized on either transabdominal or endovaginal imaging
question obscured by bowel and an enlarged uterus

Other findings

No free pelvic fluid or adnexal masses
IMPRESSION: Enlarged fibroid uterus as above.

Nonvisualization of ovaries.

## 2017-06-14 ENCOUNTER — Ambulatory Visit: Payer: Self-pay | Admitting: Family Medicine

## 2017-06-20 ENCOUNTER — Ambulatory Visit (INDEPENDENT_AMBULATORY_CARE_PROVIDER_SITE_OTHER): Payer: Self-pay | Admitting: Family Medicine

## 2017-06-20 ENCOUNTER — Encounter: Payer: Self-pay | Admitting: Family Medicine

## 2017-06-20 VITALS — BP 158/92 | HR 93 | Temp 98.3°F | Resp 16 | Ht 61.0 in | Wt 217.0 lb

## 2017-06-20 DIAGNOSIS — R109 Unspecified abdominal pain: Secondary | ICD-10-CM

## 2017-06-20 DIAGNOSIS — N920 Excessive and frequent menstruation with regular cycle: Secondary | ICD-10-CM

## 2017-06-20 DIAGNOSIS — G629 Polyneuropathy, unspecified: Secondary | ICD-10-CM

## 2017-06-20 DIAGNOSIS — D649 Anemia, unspecified: Secondary | ICD-10-CM

## 2017-06-20 DIAGNOSIS — K5900 Constipation, unspecified: Secondary | ICD-10-CM

## 2017-06-20 DIAGNOSIS — I10 Essential (primary) hypertension: Secondary | ICD-10-CM

## 2017-06-20 LAB — CBC
HEMATOCRIT: 33.3 % — AB (ref 35.0–45.0)
Hemoglobin: 10.3 g/dL — ABNORMAL LOW (ref 11.7–15.5)
MCH: 25.2 pg — AB (ref 27.0–33.0)
MCHC: 30.9 g/dL — AB (ref 32.0–36.0)
MCV: 81.4 fL (ref 80.0–100.0)
PLATELETS: 186 10*3/uL (ref 140–400)
RBC: 4.09 10*6/uL (ref 3.80–5.10)
RDW: 22.2 % — AB (ref 11.0–15.0)
WBC: 7.8 10*3/uL (ref 3.8–10.8)

## 2017-06-20 LAB — BASIC METABOLIC PANEL WITH GFR
BUN: 20 mg/dL (ref 7–25)
CHLORIDE: 108 mmol/L (ref 98–110)
CO2: 23 mmol/L (ref 20–32)
Calcium: 9.9 mg/dL (ref 8.6–10.4)
Creat: 0.75 mg/dL (ref 0.50–1.05)
GFR, EST AFRICAN AMERICAN: 105 mL/min/{1.73_m2} (ref >=60)
GFR, EST NON AFRICAN AMERICAN: 91 mL/min/{1.73_m2} (ref >=60)
Glucose, Bld: 86 mg/dL (ref 65–99)
POTASSIUM: 4.3 mmol/L (ref 3.5–5.3)
Sodium: 140 mmol/L (ref 135–146)

## 2017-06-20 MED ORDER — DOCUSATE SODIUM 100 MG PO CAPS: 100.0000 mg | ORAL_CAPSULE | Freq: Every day | ORAL | 1 refills | Status: DC | PRN

## 2017-06-20 MED ORDER — KETOROLAC TROMETHAMINE 60 MG/2ML IM SOLN
60.0000 mg | Freq: Once | INTRAMUSCULAR | Status: AC
  Administered 2017-06-20: 60 mg via INTRAMUSCULAR

## 2017-06-20 MED ORDER — GABAPENTIN 100 MG PO CAPS: 100.0000 mg | ORAL_CAPSULE | Freq: Three times a day (TID) | ORAL | 1 refills | Status: DC

## 2017-06-20 NOTE — Progress Notes (Signed)
Subjective:    Patient ID: Caroline Ryan, female    DOB: 1966/03/13, 51 y.o.   MRN: [id number]  HPI  Caroline Ryan, a 51 year old female with a history of menorrhagia due to multiple uterine fibroids.  Patient was recently admitted to inpatient services for menorrhagia and chronic blood loss.  She was treated with 2 units of packed red blood cells and an iron infusion.  Patient has been evaluated in gynecology and Megace was increased to 3 times a day.  She reports that menses have decreased in volume over the past week.  She has a follow-up appointment scheduled with Dr. Arlina Robes on tomorrow morning to discuss care plan going forward.  Caroline Ryan continues to complain of fatigue.  Symptoms began several months ago. Sentinal symptom the patient feels fatigue began with: excessive menstrual bleeding. Symptoms of her fatigue have been diffuse soft tissue aches and pains and general malaise.  Patient denies fever, significant change in weight and cold intolerance, constipation and change in hair texture.. Symptoms have gradually worsened. Severity has been moderate.  She is currently complaining of abdominal cramping and pelvic pain.  Pain intensity 7 out of 10.  She has not attempted any over-the-counter interventions to alleviate symptoms.  Past Medical History:  Diagnosis Date  . Anemia   . Asthma   . Fibroid   . Headache(784.0)   . History of blood transfusion   . Hypertension    Social History   Social History  . Marital status: Single    Spouse name: N/A  . Number of children: N/A  . Years of education: N/A   Occupational History  . Not on file.   Social History Main Topics  . Smoking status: Former Research scientist (life sciences)  . Smokeless tobacco: Never Used     Comment: as teenager  . Alcohol use No  . Drug use: No  . Sexual activity: Yes    Birth control/ protection: Condom   Other Topics Concern  . Not on file   Social History Narrative  . No narrative on file   Immunization History   Administered Date(s) Administered  . Influenza Whole 06/30/2009  . Influenza,inj,Quad PF,6+ Mos 06/27/2015, 07/04/2016  . Pneumococcal Polysaccharide-23 04/14/2008, 06/27/2015  . Td 10/18/2005  . Tdap 07/04/2016   Review of Systems  Constitutional: Positive for fatigue. Negative for fever.  Eyes: Negative for photophobia and visual disturbance.  Cardiovascular: Negative.   Gastrointestinal: Positive for constipation.  Endocrine: Negative for polydipsia, polyphagia and polyuria.  Genitourinary: Negative.  Negative for vaginal discharge and vaginal pain.  Skin: Negative.   Neurological: Negative.  Negative for dizziness, tremors, weakness and numbness.  Hematological: Negative.   Psychiatric/Behavioral: Negative for sleep disturbance and suicidal ideas.       Objective:   Physical Exam  Constitutional: She is oriented to person, place, and time. She appears well-developed and well-nourished.  HENT:  Head: Normocephalic and atraumatic.  Right Ear: Hearing, tympanic membrane, external ear and ear canal normal.  Left Ear: Hearing, tympanic membrane, external ear and ear canal normal.  Nose: Nose normal.  Eyes: Pupils are equal, round, and reactive to light. Conjunctivae and EOM are normal.  Neck: Normal range of motion. Neck supple.  Cardiovascular: Normal rate, regular rhythm, normal heart sounds and intact distal pulses.   Trace bilateral lower extremity edema  Pulmonary/Chest: Effort normal and breath sounds normal.  Abdominal: Soft. Bowel sounds are normal.  Neurological: She is alert and oriented to person, place, and time. She has  normal reflexes.  Skin: Skin is warm and dry.  Psychiatric: She has a normal mood and affect. Her behavior is normal. Judgment and thought content normal.      BP (!) 158/92 (BP Location: Right Arm, Patient Position: Sitting, Cuff Size: Large) Comment: manually  Pulse 93   Temp 98.3 F (36.8 C) (Oral)   Resp 16   Ht 5\' 1"  (1.549 m)   Wt 217  lb (98.4 kg)   SpO2 100%   BMI 41.00 kg/m  Assessment & Plan:   1. Menorrhagia with regular cycle Patient to follow-up with Dr. Arlina Robes at [facility] on tomorrow at [place].  Discussed the importance of a follow-up appointment - CBC  2. Symptomatic anemia Previous hemoglobin was 9.1 will recheck CBC today.  Patient to continue ferrous sulfate 325 mg daily - CBC  3. Constipation, unspecified constipation type Patient complaining of constipation due to ferrous sulfate will start a trial of Colace 100 mg at bedtime.  Also recommend a fiber rich diet and increase water intake - docusate sodium (COLACE) 100 MG capsule; Take 1 capsule (100 mg total) by mouth daily as needed for mild constipation.  Dispense: 30 capsule; Refill: 1  4. Abdominal cramping  - ketorolac (TORADOL) injection 60 mg; Inject 2 mLs (60 mg total) into the muscle once.  5. Neuropathy - gabapentin (NEURONTIN) 100 MG capsule; Take 1 capsule (100 mg total) by mouth 3 (three) times daily.  Dispense: 90 capsule; Refill: 1  6. HYPERTENSION, BENIGN ESSENTIAL Blood pressures above goal on current medication regimen.  Patient has increased pain today, I will not adjust blood pressure medications at this time.  Patient is to return in 1 week for blood pressure check will address blood pressure at that time.  Will review renal functioning as results become available - BASIC METABOLIC PANEL WITH GFR   RTC: One week for blood pressure check.  3 months for chronic conditions [name]  MSN, FNP-C [facility] [address] [place], [place] [address] [phone]

## 2017-06-20 NOTE — Patient Instructions (Signed)
Your previous hemoglobin was 9.1, will recheck hemoglobin today and we will follow-up by phone with any abnormal lab results.  Please follow-up with Dr. Laurena Bering on tomorrow and schedule to discuss care plan for fibroids. Continue ferrous sulfate 325 mg daily.  We will start Colace 100 mg at bedtime for constipation.  Also, increase daily fiber intake and water intake to 6-8 glasses.  Your blood pressure is elevated today, will recheck blood pressure in 1 week.  You complained of abdominal cramping today and received Toradol 60 mg IM x1 without complication.

## 2017-06-21 ENCOUNTER — Telehealth: Payer: Self-pay

## 2017-06-21 ENCOUNTER — Ambulatory Visit (INDEPENDENT_AMBULATORY_CARE_PROVIDER_SITE_OTHER): Payer: Self-pay | Admitting: Obstetrics and Gynecology

## 2017-06-21 VITALS — BP 172/109 | HR 98 | Wt 218.9 lb

## 2017-06-21 DIAGNOSIS — N920 Excessive and frequent menstruation with regular cycle: Secondary | ICD-10-CM

## 2017-06-21 DIAGNOSIS — D219 Benign neoplasm of connective and other soft tissue, unspecified: Secondary | ICD-10-CM

## 2017-06-21 NOTE — Telephone Encounter (Signed)
Patient returned call. I advised of hgb improved to 10.3 and to continue ferrous sulfate 325mg  daily and colace as directed. Asked that patient keep next scheduled appointment. Thanks!

## 2017-06-21 NOTE — Telephone Encounter (Signed)
Called, no answer. Left a message for patient to return call. Thanks!  

## 2017-06-21 NOTE — Telephone Encounter (Signed)
-----   Message from Dorena Dew, [place] sent at 06/21/2017  1:12 PM EDT ----- Please inform patient that hemoglobin has improved to 10.3.  Continue ferrous sulfate 325 mg daily.  Also to continue Colace stool softener.  Follow-up in office as scheduled  Thanks

## 2017-06-21 NOTE — Progress Notes (Signed)
Pt here for follow from [facility] and discuss treatment options for her menorrhagia and uterine fibroids. Pt informed that her EMBX was normal. Still having bleeding but has improved with increase in Megace. Has varying degrees of bleeding each day from heavy to light. Last Hgb improved as well  ROS Cardiac: No CP Pulm: No SOB GU/GI: denies dysfunction  PE   Lungs clear Heart RRR Abd soft + BS GU uterus 12-14 weeks, scant blood noted  A/P Menorrhagia        Uterine Fibroids  Tx options reviewed with pt including continue with Megace, IUD, ablation and hysterectomy. R/B of each reviewed with pt Pt is considering hysterectomy. Additional information provided to pt. She is going to think about it and call back with her choice. She is to continue with the Megace in the meantime

## 2017-06-21 NOTE — Patient Instructions (Signed)
Vaginal Delivery Vaginal delivery means that you will give birth by pushing your baby out of your birth canal (vagina). A team of health care providers will help you before, during, and after vaginal delivery. Birth experiences are unique for every woman and every pregnancy, and birth experiences vary depending on where you choose to give birth. What should I do to prepare for my baby's birth? Before your baby is born, it is important to talk with your health care provider about:  Your labor and delivery preferences. These may include: ? Medicines that you may be given. ? How you will manage your pain. This might include non-medical pain relief techniques or injectable pain relief such as epidural analgesia. ? How you and your baby will be monitored during labor and delivery. ? Who may be in the labor and delivery room with you. ? Your feelings about surgical delivery of your baby (cesarean delivery, or C-section) if this becomes necessary. ? Your feelings about receiving donated blood through an IV tube (blood transfusion) if this becomes necessary.  Whether you are able: ? To take pictures or videos of the birth. ? To eat during labor and delivery. ? To move around, walk, or change positions during labor and delivery.  What to expect after your baby is born, such as: ? Whether delayed umbilical cord clamping and cutting is offered. ? Who will care for your baby right after birth. ? Medicines or tests that may be recommended for your baby. ? Whether breastfeeding is supported in your hospital or birth center. ? How long you will be in the hospital or birth center.  How any medical conditions you have may affect your baby or your labor and delivery experience.  To prepare for your baby's birth, you should also:  Attend all of your health care visits before delivery (prenatal visits) as recommended by your health care provider. This is important.  Prepare your home for your baby's  arrival. Make sure that you have: ? Diapers. ? Baby clothing. ? Feeding equipment. ? Safe sleeping arrangements for you and your baby.  Install a car seat in your vehicle. Have your car seat checked by a certified car seat installer to make sure that it is installed safely.  Think about who will help you with your new baby at home for at least the first several weeks after delivery.  What can I expect when I arrive at the birth center or hospital? Once you are in labor and have been admitted into the hospital or birth center, your health care provider may:  Review your pregnancy history and any concerns you have.  Insert an IV tube into one of your veins. This is used to give you fluids and medicines.  Check your blood pressure, pulse, temperature, and heart rate (vital signs).  Check whether your bag of water (amniotic sac) has broken (ruptured).  Talk with you about your birth plan and discuss pain control options.  Monitoring Your health care provider may monitor your contractions (uterine monitoring) and your baby's heart rate (fetal monitoring). You may need to be monitored:  Often, but not continuously (intermittently).  All the time or for long periods at a time (continuously). Continuous monitoring may be needed if: ? You are taking certain medicines, such as medicine to relieve pain or make your contractions stronger. ? You have pregnancy or labor complications.  Monitoring may be done by:  Placing a special stethoscope or a handheld monitoring device on your abdomen to   check your baby's heartbeat, and feeling your abdomen for contractions. This method of monitoring does not continuously record your baby's heartbeat or your contractions.  Placing monitors on your abdomen (external monitors) to record your baby's heartbeat and the frequency and length of contractions. You may not have to wear external monitors all the time.  Placing monitors inside of your uterus  (internal monitors) to record your baby's heartbeat and the frequency, length, and strength of your contractions. ? Your health care provider may use internal monitors if he or she needs more information about the strength of your contractions or your baby's heart rate. ? Internal monitors are put in place by passing a thin, flexible wire through your vagina and into your uterus. Depending on the type of monitor, it may remain in your uterus or on your baby's head until birth. ? Your health care provider will discuss the benefits and risks of internal monitoring with you and will ask for your permission before inserting the monitors.  Telemetry. This is a type of continuous monitoring that can be done with external or internal monitors. Instead of having to stay in bed, you are able to move around during telemetry. Ask your health care provider if telemetry is an option for you.  Physical exam Your health care provider may perform a physical exam. This may include:  Checking whether your baby is positioned: ? With the head toward your vagina (head-down). This is most common. ? With the head toward the top of your uterus (head-up or breech). If your baby is in a breech position, your health care provider may try to turn your baby to a head-down position so you can deliver vaginally. If it does not seem that your baby can be born vaginally, your provider may recommend surgery to deliver your baby. In rare cases, you may be able to deliver vaginally if your baby is head-up (breech delivery). ? Lying sideways (transverse). Babies that are lying sideways cannot be delivered vaginally.  Checking your cervix to determine: ? Whether it is thinning out (effacing). ? Whether it is opening up (dilating). ? How low your baby has moved into your birth canal.  What are the three stages of labor and delivery?  Normal labor and delivery is divided into the following three stages: Stage 1  Stage 1 is the  longest stage of labor, and it can last for hours or days. Stage 1 includes: ? Early labor. This is when contractions may be irregular, or regular and mild. Generally, early labor contractions are more than 10 minutes apart. ? Active labor. This is when contractions get longer, more regular, more frequent, and more intense. ? The transition phase. This is when contractions happen very close together, are very intense, and may last longer than during any other part of labor.  Contractions generally feel mild, infrequent, and irregular at first. They get stronger, more frequent (about every 2-3 minutes), and more regular as you progress from early labor through active labor and transition.  Many women progress through stage 1 naturally, but you may need help to continue making progress. If this happens, your health care provider may talk with you about: ? Rupturing your amniotic sac if it has not ruptured yet. ? Giving you medicine to help make your contractions stronger and more frequent.  Stage 1 ends when your cervix is completely dilated to 4 inches (10 cm) and completely effaced. This happens at the end of the transition phase. Stage 2  Once   your cervix is completely effaced and dilated to 4 inches (10 cm), you may start to feel an urge to push. It is common for the body to naturally take a rest before feeling the urge to push, especially if you received an epidural or certain other pain medicines. This rest period may last for up to 1-2 hours, depending on your unique labor experience.  During stage 2, contractions are generally less painful, because pushing helps relieve contraction pain. Instead of contraction pain, you may feel stretching and burning pain, especially when the widest part of your baby's head passes through the vaginal opening (crowning).  Your health care provider will closely monitor your pushing progress and your baby's progress through the vagina during stage 2.  Your  health care provider may massage the area of skin between your vaginal opening and anus (perineum) or apply warm compresses to your perineum. This helps it stretch as the baby's head starts to crown, which can help prevent perineal tearing. ? In some cases, an incision may be made in your perineum (episiotomy) to allow the baby to pass through the vaginal opening. An episiotomy helps to make the opening of the vagina larger to allow more room for the baby to fit through.  It is very important to breathe and focus so your health care provider can control the delivery of your baby's head. Your health care provider may have you decrease the intensity of your pushing, to help prevent perineal tearing.  After delivery of your baby's head, the shoulders and the rest of the body generally deliver very quickly and without difficulty.  Once your baby is delivered, the umbilical cord may be cut right away, or this may be delayed for 1-2 minutes, depending on your baby's health. This may vary among health care providers, hospitals, and birth centers.  If you and your baby are healthy enough, your baby may be placed on your chest or abdomen to help maintain the baby's temperature and to help you bond with each other. Some mothers and babies start breastfeeding at this time. Your health care team will dry your baby and help keep your baby warm during this time.  Your baby may need immediate care if he or she: ? Showed signs of distress during labor. ? Has a medical condition. ? Was born too early (prematurely). ? Had a bowel movement before birth (meconium). ? Shows signs of difficulty transitioning from being inside the uterus to being outside of the uterus. If you are planning to breastfeed, your health care team will help you begin a feeding. Stage 3  The third stage of labor starts immediately after the birth of your baby and ends after you deliver the placenta. The placenta is an organ that develops  during pregnancy to provide oxygen and nutrients to your baby in the womb.  Delivering the placenta may require some pushing, and you may have mild contractions. Breastfeeding can stimulate contractions to help you deliver the placenta.  After the placenta is delivered, your uterus should tighten (contract) and become firm. This helps to stop bleeding in your uterus. To help your uterus contract and to control bleeding, your health care provider may: ? Give you medicine by injection, through an IV tube, by mouth, or through your rectum (rectally). ? Massage your abdomen or perform a vaginal exam to remove any blood clots that are left in your uterus. ? Empty your bladder by placing a thin, flexible tube (catheter) into your bladder. ? Encourage   you to breastfeed your baby. After labor is over, you and your baby will be monitored closely to ensure that you are both healthy until you are ready to go home. Your health care team will teach you how to care for yourself and your baby. This information is not intended to replace advice given to you by your health care provider. Make sure you discuss any questions you have with your health care provider. Document Released: 05/15/2008 Document Revised: 02/24/2016 Document Reviewed: 08/21/2015 Elsevier Interactive Patient Education  2018 [employer]  

## 2017-06-28 NOTE — Telephone Encounter (Signed)
No note

## 2017-07-04 MED FILL — hydrOXYzine HCL 25 MG TABS: 25 | 7 days supply | Qty: 30 | Fill #1

## 2017-07-04 MED FILL — MEGESTROL 40 MG TABLET: 40 | 30 days supply | Qty: 120 | Fill #1

## 2017-07-04 MED FILL — LISINOPRIL-HCTZ 20-25 MG TA: 20-25 | 30 days supply | Qty: 30 | Fill #3

## 2017-07-16 ENCOUNTER — Ambulatory Visit: Payer: Self-pay | Admitting: Obstetrics and Gynecology

## 2017-07-19 ENCOUNTER — Ambulatory Visit: Payer: Medicaid Other

## 2017-07-19 ENCOUNTER — Encounter: Payer: Self-pay | Admitting: Obstetrics and Gynecology

## 2017-07-19 ENCOUNTER — Encounter: Payer: Self-pay | Admitting: Family Medicine

## 2017-07-19 ENCOUNTER — Ambulatory Visit (INDEPENDENT_AMBULATORY_CARE_PROVIDER_SITE_OTHER): Payer: Self-pay | Admitting: Obstetrics and Gynecology

## 2017-07-19 VITALS — BP 157/88 | HR 82 | Ht 61.0 in | Wt 222.4 lb

## 2017-07-19 VITALS — BP 142/84

## 2017-07-19 DIAGNOSIS — I10 Essential (primary) hypertension: Secondary | ICD-10-CM

## 2017-07-19 DIAGNOSIS — D219 Benign neoplasm of connective and other soft tissue, unspecified: Secondary | ICD-10-CM

## 2017-07-19 DIAGNOSIS — N920 Excessive and frequent menstruation with regular cycle: Secondary | ICD-10-CM

## 2017-07-19 NOTE — Progress Notes (Signed)
Elevated depression screen but refuses to talk to behavioral clinician.

## 2017-07-19 NOTE — Patient Instructions (Signed)
Vaginal Hysterectomy A vaginal hysterectomy is a procedure to remove all or part of the uterus through a small incision in the vagina. In this procedure, your health care provider may remove your entire uterus, including the lower end (cervix). You may need a vaginal hysterectomy to treat:  Uterine fibroids.  A condition that causes the lining of the uterus to grow in other areas (endometriosis).  Problems with pelvic support.  Cancer of the cervix, ovaries, uterus, or tissue that lines the uterus (endometrium).  Excessive (dysfunctional) uterine bleeding.  When removing your uterus, your health care provider may also remove the organs that produce eggs (ovaries) and the tubes that carry eggs to your uterus (fallopian tubes). After a vaginal hysterectomy, you will no longer be able to have a baby. You will also no longer get your menstrual period. Tell a health care provider about:  Any allergies you have.  All medicines you are taking, including vitamins, herbs, eye drops, creams, and over-the-counter medicines.  Any problems you or family members have had with anesthetic medicines.  Any blood disorders you have.  Any surgeries you have had.  Any medical conditions you have.  Whether you are pregnant or may be pregnant. What are the risks? Generally, this is a safe procedure. However, problems may occur, including:  Bleeding.  Infection.  A blood clot that forms in your leg and travels to your lungs (pulmonary embolism).  Damage to surrounding organs.  Pain during sex.  What happens before the procedure?  Ask your health care provider what organs will be removed during surgery.  Ask your health care provider about: ? Changing or stopping your regular medicines. This is especially important if you are taking diabetes medicines or blood thinners. ? Taking medicines such as aspirin and ibuprofen. These medicines can thin your blood. Do not take these medicines before  your procedure if your health care provider instructs you not to.  Follow instructions from your health care provider about eating or drinking restrictions.  Do not use any tobacco products, such as cigarettes, chewing tobacco, and e-cigarettes. If you need help quitting, ask your health care provider.  Plan to have someone take you home after discharge from the hospital. What happens during the procedure?  To reduce your risk of infection: ? Your health care team will wash or sanitize their hands. ? Your skin will be washed with soap.  An IV tube will be inserted into one of your veins.  You may be given antibiotic medicine to help prevent infection.  You will be given one or more of the following: ? A medicine to help you relax (sedative). ? A medicine to numb the area (local anesthetic). ? A medicine to make you fall asleep (general anesthetic). ? A medicine that is injected into an area of your body to numb everything beyond the injection site (regional anesthetic).  Your surgeon will make an incision in your vagina.  Your surgeon will locate and remove all or part of your uterus.  Your ovaries and fallopian tubes may be removed at the same time.  The incision will be closed with stitches (sutures) that dissolve over time. The procedure may vary among health care providers and hospitals. What happens after the procedure?  Your blood pressure, heart rate, breathing rate, and blood oxygen level will be monitored often until the medicines you were given have worn off.  You will be encouraged to get up and walk around after a few hours to help prevent   complications.  You may have IV tubes in place for a few days.  You will be given pain medicine as needed.  Do not drive for 24 hours if you were given a sedative. This information is not intended to replace advice given to you by your health care provider. Make sure you discuss any questions you have with your health care  provider. Document Released: 11/28/2015 Document Revised: 01/12/2016 Document Reviewed: 08/21/2015 Elsevier Interactive Patient Education  2018 [employer]  

## 2017-07-19 NOTE — Progress Notes (Signed)
Patient ID: Caroline Ryan, female   DOB: [birth year], 51 y.o.   MRN: [id number] Caroline Ryan presents to discuss TVH Bleeding improved with [facility] EMBX and U/S have been completed TSVD x 3 She desires definite therapy  PE AF VSS Lungs clear Heart RRR Abd soft + BS  GU enlarged uterus 12 weeks mobile  A/P Menorrhagia        Uterine fibroids  TVH reviewed with pt. R/B/Post op care discussed Desires to schedule. Will continue with [facility] until surgery

## 2017-07-22 ENCOUNTER — Encounter (HOSPITAL_COMMUNITY): Payer: Self-pay

## 2017-07-23 ENCOUNTER — Encounter (HOSPITAL_COMMUNITY): Payer: Self-pay

## 2017-07-24 ENCOUNTER — Other Ambulatory Visit: Payer: Self-pay | Admitting: Family Medicine

## 2017-07-24 ENCOUNTER — Other Ambulatory Visit: Payer: Self-pay

## 2017-07-24 DIAGNOSIS — G8929 Other chronic pain: Secondary | ICD-10-CM

## 2017-07-24 DIAGNOSIS — M25561 Pain in right knee: Principal | ICD-10-CM

## 2017-07-24 MED ORDER — CETIRIZINE HCL 10 MG PO TABS: 10.0000 mg | ORAL_TABLET | Freq: Every day | ORAL | 5 refills | Status: DC

## 2017-07-24 MED FILL — MELOXICAM 15 MG TABLET: 15 | 30 days supply | Qty: 30 | Fill #0

## 2017-07-24 MED FILL — ?CETIRIZINE HCL 10 MG TABLE: 10 | 30 days supply | Qty: 30 | Fill #0

## 2017-07-24 NOTE — Telephone Encounter (Signed)
Refill for cetirizine sent into pharmacy.  Thanks!

## 2017-08-14 ENCOUNTER — Other Ambulatory Visit: Payer: Self-pay | Admitting: Family Medicine

## 2017-08-14 DIAGNOSIS — G8929 Other chronic pain: Secondary | ICD-10-CM

## 2017-08-14 DIAGNOSIS — M25561 Pain in right knee: Principal | ICD-10-CM

## 2017-08-14 MED FILL — LISINOPRIL-HCTZ 20-25 MG TA: 20-25 | 30 days supply | Qty: 30 | Fill #4

## 2017-08-14 MED FILL — MEGESTROL 40 MG TABLET: 40 | 30 days supply | Qty: 120 | Fill #2

## 2017-09-10 MED FILL — MELOXICAM 15 MG TABLET: 15 | 30 days supply | Qty: 30 | Fill #0

## 2017-09-10 MED FILL — MEGESTROL 40 MG TABLET: 40 | 30 days supply | Qty: 120 | Fill #3

## 2017-09-20 ENCOUNTER — Ambulatory Visit: Payer: Self-pay | Admitting: Family Medicine

## 2017-09-23 ENCOUNTER — Other Ambulatory Visit: Payer: Self-pay | Admitting: Family Medicine

## 2017-09-23 DIAGNOSIS — M25561 Pain in right knee: Principal | ICD-10-CM

## 2017-09-23 DIAGNOSIS — G8929 Other chronic pain: Secondary | ICD-10-CM

## 2017-09-25 NOTE — Patient Instructions (Addendum)
Your procedure is scheduled on:  Thursday, Feb 21  Enter through the Main Entrance of [facility] at: 6 am  Pick up the phone at the desk and dial [phone].  Call this number if you have problems the morning of surgery: [phone].  Remember: Do NOT eat or Do NOT drink clear liquids (including water) after midnight Wednesday  Take these medicines the morning of surgery with a SIP OF WATER: gabapentin and zyrtec  Bring albuterol inhaler with you on day of surgery.  Stop herbal medications and supplements at this time.  Do NOT wear jewelry (body piercing), metal hair clips/bobby pins, make-up, or nail polish. Do NOT wear lotions, powders, or perfumes.  You may wear deoderant. Do NOT shave for 48 hours prior to surgery. Do NOT bring valuables to the hospital.  Leave suitcase in car.  After surgery it may be brought to your room.  For patients admitted to the hospital, checkout time is 11:00 AM the day of discharge. Have a responsible adult drive you home and stay with you for 24 hours after your procedure.  Home with Daughter Caroline Ryan cell [phone] or Caroline Ryan cell [phone].

## 2017-09-30 ENCOUNTER — Other Ambulatory Visit: Payer: Self-pay | Admitting: Family Medicine

## 2017-09-30 MED FILL — !VENTOLIN HFA INHALER: 108 (90 BAS | 25 days supply | Qty: 18 | Fill #1

## 2017-09-30 MED FILL — ?MONTELUKAST SOD 10 MG TAB: 10 | 30 days supply | Qty: 30 | Fill #0

## 2017-10-02 ENCOUNTER — Encounter (HOSPITAL_COMMUNITY): Payer: Self-pay

## 2017-10-02 ENCOUNTER — Encounter (HOSPITAL_COMMUNITY)
Admission: RE | Admit: 2017-10-02 | Discharge: 2017-10-02 | Disposition: A | Discharge status: Home / Self Care | Payer: Medicaid Other | Source: Ambulatory Visit | Attending: Obstetrics and Gynecology | Admitting: Obstetrics and Gynecology

## 2017-10-02 ENCOUNTER — Other Ambulatory Visit: Payer: Self-pay

## 2017-10-02 DIAGNOSIS — I10 Essential (primary) hypertension: Secondary | ICD-10-CM | POA: Insufficient documentation

## 2017-10-02 DIAGNOSIS — D649 Anemia, unspecified: Secondary | ICD-10-CM | POA: Insufficient documentation

## 2017-10-02 DIAGNOSIS — Z01812 Encounter for preprocedural laboratory examination: Secondary | ICD-10-CM | POA: Insufficient documentation

## 2017-10-02 DIAGNOSIS — N920 Excessive and frequent menstruation with regular cycle: Secondary | ICD-10-CM | POA: Insufficient documentation

## 2017-10-02 DIAGNOSIS — F329 Major depressive disorder, single episode, unspecified: Secondary | ICD-10-CM | POA: Insufficient documentation

## 2017-10-02 DIAGNOSIS — J45909 Unspecified asthma, uncomplicated: Secondary | ICD-10-CM | POA: Insufficient documentation

## 2017-10-02 DIAGNOSIS — D219 Benign neoplasm of connective and other soft tissue, unspecified: Secondary | ICD-10-CM | POA: Insufficient documentation

## 2017-10-02 DIAGNOSIS — Z87891 Personal history of nicotine dependence: Secondary | ICD-10-CM | POA: Insufficient documentation

## 2017-10-02 HISTORY — DX: Pain in right knee: M25.562

## 2017-10-02 HISTORY — DX: Pain in right knee: M25.561

## 2017-10-02 HISTORY — DX: Depression, unspecified: F32.A

## 2017-10-02 HISTORY — DX: Major depressive disorder, single episode, unspecified: F32.9

## 2017-10-02 HISTORY — DX: Pain in left knee: M25.561

## 2017-10-02 HISTORY — DX: Other seasonal allergic rhinitis: J30.2

## 2017-10-02 LAB — BASIC METABOLIC PANEL
Anion gap: 7 (ref 5–15)
BUN: 14 mg/dL (ref 6–20)
CO2: 25 mmol/L (ref 22–32)
Calcium: 9.6 mg/dL (ref 8.9–10.3)
Chloride: 105 mmol/L (ref 101–111)
Creatinine, Ser: 0.64 mg/dL (ref 0.44–1.00)
GFR calc Af Amer: >60 mL/min (ref >60)
GLUCOSE: 98 mg/dL (ref 65–99)
Potassium: 3.8 mmol/L (ref 3.5–5.1)
Sodium: 137 mmol/L (ref 135–145)

## 2017-10-02 LAB — CBC
HCT: 33.3 % — ABNORMAL LOW (ref 36.0–46.0)
Hemoglobin: 10.3 g/dL — ABNORMAL LOW (ref 12.0–15.0)
MCH: 28.6 pg (ref 26.0–34.0)
MCHC: 30.9 g/dL (ref 30.0–36.0)
MCV: 92.5 fL (ref 78.0–100.0)
Platelets: 184 10*3/uL (ref 150–400)
RBC: 3.6 MIL/uL — ABNORMAL LOW (ref 3.87–5.11)
RDW: 17.3 % — AB (ref 11.5–15.5)
WBC: 5.9 10*3/uL (ref 4.0–10.5)

## 2017-10-02 NOTE — Pre-Procedure Instructions (Signed)
SDS BB History Log given to Lab for patient's history of blood transfusion in 05/2017 at [facility].

## 2017-10-02 NOTE — Pre-Procedure Instructions (Signed)
Reviewed patient's history, medication and EKG with Dr. Lyndle Herrlich.  No orders given.  [place] for surgery.

## 2017-10-04 ENCOUNTER — Encounter: Payer: Self-pay | Admitting: Family Medicine

## 2017-10-04 ENCOUNTER — Ambulatory Visit (INDEPENDENT_AMBULATORY_CARE_PROVIDER_SITE_OTHER): Payer: Self-pay | Admitting: Family Medicine

## 2017-10-04 VITALS — BP 160/82 | HR 96 | Temp 98.3°F | Resp 18 | Ht 61.0 in | Wt 229.0 lb

## 2017-10-04 DIAGNOSIS — I10 Essential (primary) hypertension: Secondary | ICD-10-CM

## 2017-10-04 DIAGNOSIS — M25561 Pain in right knee: Secondary | ICD-10-CM

## 2017-10-04 DIAGNOSIS — R609 Edema, unspecified: Secondary | ICD-10-CM

## 2017-10-04 DIAGNOSIS — G8929 Other chronic pain: Secondary | ICD-10-CM

## 2017-10-04 MED ORDER — MELOXICAM 15 MG PO TABS: 15.0000 mg | ORAL_TABLET | Freq: Every day | ORAL | 0 refills | Status: DC

## 2017-10-04 MED ORDER — TRAMADOL HCL 50 MG PO TABS: 50.0000 mg | ORAL_TABLET | Freq: Three times a day (TID) | ORAL | 0 refills | Status: DC | PRN

## 2017-10-04 MED ORDER — CLONIDINE HCL 0.1 MG PO TABS
0.1000 mg | ORAL_TABLET | Freq: Once | ORAL | Status: AC
  Administered 2017-10-04: 0.1 mg via ORAL

## 2017-10-04 MED ORDER — KETOROLAC TROMETHAMINE 60 MG/2ML IM SOLN
60.0000 mg | Freq: Once | INTRAMUSCULAR | Status: AC
  Administered 2017-10-04: 60 mg via INTRAMUSCULAR

## 2017-10-04 NOTE — Patient Instructions (Addendum)
For bilateral knee pain, will continue Meloxicam 15 mg. Tramadol 50 mg every 6 hours for moderate to severe knee pain.   The patient was given clear instructions to go to ER or return to medical center if symptoms do not improve, worsen or new problems develop. The patient verbalized understanding.     Knee Pain, Adult Many things can cause knee pain. The pain often goes away on its own with time and rest. If the pain does not go away, tests may be done to find out what is causing the pain. Follow these instructions at home: Activity  Rest your knee.  Do not do things that cause pain.  Avoid activities where both feet leave the ground at the same time (high-impact activities). Examples are running, jumping rope, and doing jumping jacks. General instructions  Take medicines only as told by your doctor.  Raise (elevate) your knee when you are resting. Make sure your knee is higher than your heart.  Sleep with a pillow under your knee.  If told, put ice on the knee: ? Put ice in a plastic bag. ? Place a towel between your skin and the bag. ? Leave the ice on for 20 minutes, 2-3 times a day.  Ask your doctor if you should wear an elastic knee support.  Lose weight if you are overweight. Being overweight can make your knee hurt more.  Do not use any tobacco products. These include cigarettes, chewing tobacco, or electronic cigarettes. If you need help quitting, ask your doctor. Smoking may slow down healing. Contact a doctor if:  The pain does not stop.  The pain changes or gets worse.  You have a fever along with knee pain.  Your knee gives out or locks up.  Your knee swells, and becomes worse. Get help right away if:  Your knee feels warm.  You cannot move your knee.  You have very bad knee pain.  You have chest pain.  You have trouble breathing. Summary  Many things can cause knee pain. The pain often goes away on its own with time and rest.  Avoid activities  that put stress on your knee. These include running and jumping rope.  Get help right away if you cannot move your knee, or if your knee feels warm, or if you have trouble breathing. This information is not intended to replace advice given to you by your health care provider. Make sure you discuss any questions you have with your health care provider. Document Released: 11/02/2008 Document Revised: 07/31/2016 Document Reviewed: 07/31/2016 Elsevier Interactive Patient Education  2017 [employer].

## 2017-10-04 NOTE — Progress Notes (Signed)
Subjective:   Chief Complaint  Patient presents with  . Knee Pain    bilateral     Caroline Ryan is a 52 y.o. female with a history of hypertension and chronic knee pain presents complaining of worsening bilateral knee pain over the past week. Patient states that she does not have transportation and has been walking long distances to the bus stop. She states that walking uphill several times per day has worsened knee pain. She says that knee pain is aggravated by weight bearing. She denies any previous injury. She has been taking OTC analgesics without sustained relief. She last had Ibuprofen around 9 pm last night. Current pain intensity 10/10 characterized as constant and throbbing.  Past Medical History:  Diagnosis Date  . Anemia   . Asthma   . Depression    no meds  . Fibroid   . Headache(784.0)    otc prn med  . History of blood transfusion   . Hypertension   . Knee pain, bilateral    arthralgia knee pain bilateral  . Seasonal allergies   . SVD (spontaneous vaginal delivery)    x 3   Social History   Socioeconomic History  . Marital status: Single    Spouse name: Not on file  . Number of children: Not on file  . Years of education: Not on file  . Highest education level: Not on file  Social Needs  . Financial resource strain: Not on file  . Food insecurity - worry: Not on file  . Food insecurity - inability: Not on file  . Transportation needs - medical: Not on file  . Transportation needs - non-medical: Not on file  Occupational History  . Not on file  Tobacco Use  . Smoking status: Former Smoker    Packs/day: 0.25    Years: 2.00    Pack years: 0.50    Types: Cigarettes    Last attempt to quit: 08/20/2014    Years since quitting: 3.1  . Smokeless tobacco: Never Used  Substance and Sexual Activity  . Alcohol use: Yes    Alcohol/week: 1.2 oz    Types: 2 Glasses of wine per week  . Drug use: No  . Sexual activity: Yes    Birth control/protection: Condom   Other Topics Concern  . Not on file  Social History Narrative  . Not on file   Immunization History  Administered Date(s) Administered  . Influenza Whole 06/30/2009  . Influenza,inj,Quad PF,6+ Mos 06/27/2015, 07/04/2016  . Pneumococcal Polysaccharide-23 04/14/2008, 06/27/2015  . Td 10/18/2005  . Tdap 07/04/2016    Review of Systems  Constitutional: Negative.   HENT: Negative.   Eyes: Negative.   Respiratory: Negative.   Cardiovascular: Negative.   Gastrointestinal: Negative.   Genitourinary: Negative.   Musculoskeletal: Positive for joint pain (bilateral knees).       Knee pain and swelling  Neurological: Negative.   Psychiatric/Behavioral: Negative.    Objective:    BP (!) 162/96 (BP Location: Left Arm, Patient Position: Sitting, Cuff Size: Large) Comment: manually  Pulse 96   Temp 98.3 F (36.8 C) (Oral)   Resp 18   Ht 5\' 1"  (1.549 m)   Wt 229 lb (103.9 kg)   LMP 04/03/2017 (Approximate)   SpO2 100%   BMI 43.27 kg/m   Physical Exam  Constitutional: She is well-developed, well-nourished, and in no distress.  Cardiovascular: Normal rate, regular rhythm, normal heart sounds and intact distal pulses.  Pulmonary/Chest: Effort normal and breath sounds  normal.  Abdominal: Soft. Bowel sounds are normal.  Musculoskeletal:       Right knee: She exhibits decreased range of motion and swelling.       Left knee: She exhibits decreased range of motion and swelling.   Assessment:     Moderate osteoarthritis bilaterally    Plan:      Chronic pain of right knee - meloxicam (MOBIC) 15 MG tablet; Take 1 tablet (15 mg total) by mouth daily.  Dispense: 30 tablet; Refill: 0 - ketorolac (TORADOL) injection 60 mg - traMADol (ULTRAM) 50 MG tablet; Take 1 tablet (50 mg total) by mouth every 8 (eight) hours as needed.  Dispense: 15 tablet; Refill: 0   Edema, unspecified type Elevate extremities to heart level while at rest.  Recommend warm moist compresses at needed  Essential  hypertension Blood pressure elevates on arrival. I suspect that elevated bp may be related to pain.  - Continue medication, monitor blood pressure at home. Continue DASH diet. Reminder to go to the ER if any CP, SOB, nausea, dizziness, severe HA, changes vision/speech, left arm numbness and tingling and jaw pain. - cloNIDine (CATAPRES) tablet 0.1 mg   RTC: As previously scheduled    The patient was given clear instructions to go to ER or return to medical center if symptoms do not improve, worsen or new problems develop. The patient verbalized understanding.   [name]  MSN, FNP-C [facility] [address] [place], [place] [address] [phone]

## 2017-10-07 MED FILL — MELOXICAM 15 MG TABLET: 15 | 30 days supply | Qty: 30 | Fill #0

## 2017-10-09 MED FILL — traMADol HCL 50 MG TABS: 50 | 5 days supply | Qty: 15 | Fill #0

## 2017-10-09 NOTE — Anesthesia Preprocedure Evaluation (Addendum)
Anesthesia Evaluation  Patient identified by MRN, date of birth, ID band Patient awake    Airway Mallampati: III  TM Distance: >3 FB Neck ROM: Full    Dental no notable dental hx.    Pulmonary former smoker,    Pulmonary exam normal        Cardiovascular hypertension, Normal cardiovascular exam     Neuro/Psych Depression    GI/Hepatic negative GI ROS, Neg liver ROS,   Endo/Other  negative endocrine ROS  Renal/GU negative Renal ROS     Musculoskeletal   Abdominal   Peds  Hematology  (+) anemia ,   Anesthesia Other Findings   Reproductive/Obstetrics                            Lab Results  Component Value Date   WBC 5.9 10/02/2017   HGB 10.3 (L) 10/02/2017   HCT 33.3 (L) 10/02/2017   MCV 92.5 10/02/2017   PLT 184 10/02/2017   Lab Results  Component Value Date   CREATININE 0.64 10/02/2017   BUN 14 10/02/2017   NA 137 10/02/2017   K 3.8 10/02/2017   CL 105 10/02/2017   CO2 25 10/02/2017    Anesthesia Physical Anesthesia Plan  ASA: III  Anesthesia Plan: General   Post-op Pain Management:    Induction: Intravenous  PONV Risk Score and Plan: 3 and Treatment may vary due to age or medical condition, Ondansetron, Dexamethasone and Scopolamine patch - Pre-op  Airway Management Planned: Oral ETT  Additional Equipment:   Intra-op Plan:   Post-operative Plan:   Informed Consent: I have reviewed the patients History and Physical, chart, labs and discussed the procedure including the risks, benefits and alternatives for the proposed anesthesia with the patient or authorized representative who has indicated his/her understanding and acceptance.   Dental advisory given  Plan Discussed with: CRNA and Anesthesiologist  Anesthesia Plan Comments:         Anesthesia Quick Evaluation

## 2017-10-09 NOTE — H&P (Signed)
Caroline Ryan is an 52 y.o. female with known menorrhagia and uterine fibroids. Anemia secondary to the same which has required transfusion. EMBX was negative. Taking Megace which has helped some but still has some degree of bleeding on daily. She desires definite therapy.   Menstrual History: Menarche age: 32 Patient's last menstrual period was 04/03/2017 (approximate).    Past Medical History:  Diagnosis Date  . Anemia   . Asthma   . Depression    no meds  . Fibroid   . Headache(784.0)    otc prn med  . History of blood transfusion   . Hypertension   . Knee pain, bilateral    arthralgia knee pain bilateral  . Seasonal allergies   . SVD (spontaneous vaginal delivery)    x 3    Past Surgical History:  Procedure Laterality Date  . BREAST BIOPSY     US guided core 2009  . BREAST EXCISIONAL BIOPSY     right 1982 left 1981  . BREAST SURGERY     bilateral cysts/benign    Family History  Problem Relation Age of Onset  . Hypertension Father   . Diabetes Brother   . Diabetes Brother   . Other Neg Hx     Social History:  reports that she quit smoking about 3 years ago. Her smoking use included cigarettes. She has a 0.50 pack-year smoking history. she has never used smokeless tobacco. She reports that she drinks about 1.2 oz of alcohol per week. She reports that she does not use drugs.  Allergies: No Known Allergies  No medications prior to admission.    Review of Systems  Constitutional: Negative.   Respiratory: Negative.   Cardiovascular: Negative.   Gastrointestinal: Negative.   Genitourinary: Negative.     Last menstrual period 04/03/2017. Physical Exam  Constitutional: She appears well-developed and well-nourished.  Cardiovascular: Normal rate and regular rhythm.  Respiratory: Effort normal and breath sounds normal.  GI: Soft. Bowel sounds are normal.  Genitourinary:  Genitourinary Comments: Nl EGBUS, uterus 12 weeks size, mobile, no masses or tenderness     No results found for this or any previous visit (from the past 24 hour(s)).  No results found.  Assessment/Plan: Menorrhagia  Uterine fibroids Anemia secondary to above  Pt is being admitted for TVH/BS. R/B/Post Op care have been reviewed with pt. Pt has verbalized understanding and desires to proceed.   Chancy Milroy 10/09/2017, 8:37 AM

## 2017-10-10 ENCOUNTER — Ambulatory Visit (HOSPITAL_COMMUNITY): Payer: Medicaid Other | Admitting: Anesthesiology

## 2017-10-10 ENCOUNTER — Observation Stay (HOSPITAL_COMMUNITY)
Admission: RE | Admit: 2017-10-10 | Discharge: 2017-10-11 | Disposition: A | Discharge status: Home / Self Care | Payer: Medicaid Other | Source: Ambulatory Visit | Attending: Obstetrics and Gynecology | Admitting: Obstetrics and Gynecology

## 2017-10-10 ENCOUNTER — Encounter (HOSPITAL_COMMUNITY): Payer: Self-pay

## 2017-10-10 ENCOUNTER — Other Ambulatory Visit: Payer: Self-pay

## 2017-10-10 ENCOUNTER — Encounter (HOSPITAL_COMMUNITY)
Admission: RE | Disposition: A | Discharge status: Home / Self Care | Payer: Self-pay | Source: Ambulatory Visit | Attending: Obstetrics and Gynecology

## 2017-10-10 DIAGNOSIS — N879 Dysplasia of cervix uteri, unspecified: Secondary | ICD-10-CM | POA: Insufficient documentation

## 2017-10-10 DIAGNOSIS — Z87891 Personal history of nicotine dependence: Secondary | ICD-10-CM | POA: Insufficient documentation

## 2017-10-10 DIAGNOSIS — D259 Leiomyoma of uterus, unspecified: Secondary | ICD-10-CM

## 2017-10-10 DIAGNOSIS — Z9889 Other specified postprocedural states: Secondary | ICD-10-CM

## 2017-10-10 DIAGNOSIS — D500 Iron deficiency anemia secondary to blood loss (chronic): Secondary | ICD-10-CM

## 2017-10-10 DIAGNOSIS — D649 Anemia, unspecified: Secondary | ICD-10-CM | POA: Insufficient documentation

## 2017-10-10 DIAGNOSIS — N920 Excessive and frequent menstruation with regular cycle: Secondary | ICD-10-CM

## 2017-10-10 DIAGNOSIS — N800 Endometriosis of uterus: Secondary | ICD-10-CM | POA: Insufficient documentation

## 2017-10-10 DIAGNOSIS — N888 Other specified noninflammatory disorders of cervix uteri: Secondary | ICD-10-CM | POA: Insufficient documentation

## 2017-10-10 DIAGNOSIS — I10 Essential (primary) hypertension: Secondary | ICD-10-CM | POA: Insufficient documentation

## 2017-10-10 HISTORY — PX: VAGINAL HYSTERECTOMY: SHX2639

## 2017-10-10 HISTORY — PX: BILATERAL SALPINGECTOMY: SHX5743

## 2017-10-10 LAB — HEMOGLOBIN AND HEMATOCRIT, BLOOD
HEMATOCRIT: 30.2 % — AB (ref 36.0–46.0)
Hemoglobin: 9.5 g/dL — ABNORMAL LOW (ref 12.0–15.0)

## 2017-10-10 SURGERY — HYSTERECTOMY, VAGINAL
Anesthesia: General | Site: Vagina

## 2017-10-10 MED ORDER — HYDROMORPHONE HCL 1 MG/ML IJ SOLN
INTRAMUSCULAR | Status: AC
Start: 2017-10-10 — End: 2017-10-10
  Filled 2017-10-10: qty 0.5

## 2017-10-10 MED ORDER — LACTATED RINGERS IV SOLN
INTRAVENOUS | Status: DC
  Administered 2017-10-10: 11:00:00 via INTRAVENOUS
  Administered 2017-10-10: 100 mL/h via INTRAVENOUS

## 2017-10-10 MED ORDER — ROCURONIUM BROMIDE 100 MG/10ML IV SOLN
INTRAVENOUS | Status: DC | PRN
  Administered 2017-10-10: 50 mg via INTRAVENOUS
  Administered 2017-10-10: 20 mg via INTRAVENOUS

## 2017-10-10 MED ORDER — SUGAMMADEX SODIUM 200 MG/2ML IV SOLN
INTRAVENOUS | Status: AC
  Filled 2017-10-10: qty 2

## 2017-10-10 MED ORDER — HYDROCODONE-ACETAMINOPHEN 7.5-325 MG PO TABS: 1.0000 | ORAL_TABLET | Freq: Once | ORAL | Status: DC | PRN

## 2017-10-10 MED ORDER — ONDANSETRON HCL 4 MG/2ML IJ SOLN
INTRAMUSCULAR | Status: DC | PRN
  Administered 2017-10-10: 4 mg via INTRAVENOUS

## 2017-10-10 MED ORDER — GLYCOPYRROLATE 0.2 MG/ML IJ SOLN
INTRAMUSCULAR | Status: AC
  Filled 2017-10-10: qty 4

## 2017-10-10 MED ORDER — HYDROMORPHONE HCL 1 MG/ML IJ SOLN
INTRAMUSCULAR | Status: AC
  Filled 2017-10-10: qty 0.5

## 2017-10-10 MED ORDER — LIDOCAINE HCL (CARDIAC) 20 MG/ML IV SOLN
INTRAVENOUS | Status: DC | PRN
  Administered 2017-10-10: 100 mg via INTRAVENOUS

## 2017-10-10 MED ORDER — PROMETHAZINE HCL 25 MG/ML IJ SOLN: 6.2500 mg | INTRAMUSCULAR | Status: DC | PRN

## 2017-10-10 MED ORDER — IBUPROFEN 800 MG PO TABS
800.0000 mg | ORAL_TABLET | Freq: Three times a day (TID) | ORAL | Status: DC
  Administered 2017-10-10: 800 mg via ORAL
  Filled 2017-10-10: qty 1

## 2017-10-10 MED ORDER — LORATADINE 10 MG PO TABS
10.0000 mg | ORAL_TABLET | Freq: Every day | ORAL | Status: DC
  Administered 2017-10-10: 10 mg via ORAL
  Filled 2017-10-10 (×2): qty 1

## 2017-10-10 MED ORDER — SIMETHICONE 80 MG PO CHEW: 80.0000 mg | CHEWABLE_TABLET | Freq: Four times a day (QID) | ORAL | Status: DC | PRN

## 2017-10-10 MED ORDER — SCOPOLAMINE 1 MG/3DAYS TD PT72
1.0000 | MEDICATED_PATCH | Freq: Once | TRANSDERMAL | Status: DC
  Administered 2017-10-10: 1.5 mg via TRANSDERMAL

## 2017-10-10 MED ORDER — KETOROLAC TROMETHAMINE 30 MG/ML IJ SOLN
30.0000 mg | Freq: Four times a day (QID) | INTRAMUSCULAR | Status: DC
  Administered 2017-10-10: 30 mg via INTRAVENOUS
  Filled 2017-10-10: qty 1

## 2017-10-10 MED ORDER — LISINOPRIL 20 MG PO TABS
20.0000 mg | ORAL_TABLET | Freq: Every day | ORAL | Status: DC
  Administered 2017-10-10 – 2017-10-11 (×2): 20 mg via ORAL
  Filled 2017-10-10 (×2): qty 1

## 2017-10-10 MED ORDER — MIDAZOLAM HCL 2 MG/2ML IJ SOLN
INTRAMUSCULAR | Status: AC
Start: 2017-10-10 — End: ?
  Filled 2017-10-10: qty 2

## 2017-10-10 MED ORDER — LACTATED RINGERS IV SOLN: INTRAVENOUS | Status: DC

## 2017-10-10 MED ORDER — LIDOCAINE HCL (CARDIAC) 20 MG/ML IV SOLN
INTRAVENOUS | Status: AC
  Filled 2017-10-10: qty 5

## 2017-10-10 MED ORDER — HYDROMORPHONE HCL 1 MG/ML IJ SOLN
INTRAMUSCULAR | Status: DC | PRN
  Administered 2017-10-10: 1 mg via INTRAVENOUS

## 2017-10-10 MED ORDER — ALBUTEROL SULFATE (2.5 MG/3ML) 0.083% IN NEBU: 2.5000 mg | INHALATION_SOLUTION | Freq: Four times a day (QID) | RESPIRATORY_TRACT | Status: DC | PRN

## 2017-10-10 MED ORDER — MONTELUKAST SODIUM 10 MG PO TABS
10.0000 mg | ORAL_TABLET | Freq: Every day | ORAL | Status: DC
  Administered 2017-10-10: 10 mg via ORAL
  Filled 2017-10-10: qty 1

## 2017-10-10 MED ORDER — MIDAZOLAM HCL 5 MG/5ML IJ SOLN
INTRAMUSCULAR | Status: DC | PRN
  Administered 2017-10-10: 2 mg via INTRAVENOUS

## 2017-10-10 MED ORDER — SOD CITRATE-CITRIC ACID 500-334 MG/5ML PO SOLN
30.0000 mL | ORAL | Status: AC
  Administered 2017-10-10: 30 mL via ORAL

## 2017-10-10 MED ORDER — 0.9 % SODIUM CHLORIDE (POUR BTL) OPTIME
TOPICAL | Status: DC | PRN
  Administered 2017-10-10: 1000 mL

## 2017-10-10 MED ORDER — FENTANYL CITRATE (PF) 250 MCG/5ML IJ SOLN
INTRAMUSCULAR | Status: AC
Start: 2017-10-10 — End: ?
  Filled 2017-10-10: qty 5

## 2017-10-10 MED ORDER — DEXAMETHASONE SODIUM PHOSPHATE 10 MG/ML IJ SOLN
INTRAMUSCULAR | Status: DC | PRN
  Administered 2017-10-10: 10 mg via INTRAVENOUS

## 2017-10-10 MED ORDER — LISINOPRIL-HYDROCHLOROTHIAZIDE 20-25 MG PO TABS: 1.0000 | ORAL_TABLET | Freq: Every day | ORAL | Status: DC

## 2017-10-10 MED ORDER — HYDROMORPHONE HCL 1 MG/ML IJ SOLN
0.2500 mg | INTRAMUSCULAR | Status: DC | PRN
  Administered 2017-10-10 (×3): 0.5 mg via INTRAVENOUS

## 2017-10-10 MED ORDER — DEXAMETHASONE SODIUM PHOSPHATE 10 MG/ML IJ SOLN
INTRAMUSCULAR | Status: AC
  Filled 2017-10-10: qty 1

## 2017-10-10 MED ORDER — ONDANSETRON HCL 4 MG/2ML IJ SOLN: 4.0000 mg | Freq: Four times a day (QID) | INTRAMUSCULAR | Status: DC | PRN

## 2017-10-10 MED ORDER — KETOROLAC TROMETHAMINE 30 MG/ML IJ SOLN: 30.0000 mg | Freq: Four times a day (QID) | INTRAMUSCULAR | Status: DC

## 2017-10-10 MED ORDER — LACTATED RINGERS IV SOLN
INTRAVENOUS | Status: DC
  Administered 2017-10-10: 125 mL/h via INTRAVENOUS
  Administered 2017-10-10: 08:00:00 via INTRAVENOUS

## 2017-10-10 MED ORDER — HYDROCHLOROTHIAZIDE 25 MG PO TABS
25.0000 mg | ORAL_TABLET | Freq: Every day | ORAL | Status: DC
  Administered 2017-10-11: 25 mg via ORAL
  Filled 2017-10-10: qty 1

## 2017-10-10 MED ORDER — FENTANYL CITRATE (PF) 250 MCG/5ML IJ SOLN
INTRAMUSCULAR | Status: DC | PRN
  Administered 2017-10-10: 100 ug via INTRAVENOUS
  Administered 2017-10-10 (×2): 25 ug via INTRAVENOUS
  Administered 2017-10-10: 100 ug via INTRAVENOUS

## 2017-10-10 MED ORDER — HYDROMORPHONE HCL 1 MG/ML IJ SOLN
INTRAMUSCULAR | Status: AC
  Filled 2017-10-10: qty 1

## 2017-10-10 MED ORDER — SCOPOLAMINE 1 MG/3DAYS TD PT72
MEDICATED_PATCH | TRANSDERMAL | Status: AC
  Administered 2017-10-10: 1.5 mg via TRANSDERMAL
  Filled 2017-10-10: qty 1

## 2017-10-10 MED ORDER — SOD CITRATE-CITRIC ACID 500-334 MG/5ML PO SOLN
ORAL | Status: AC
  Administered 2017-10-10: 30 mL via ORAL
  Filled 2017-10-10: qty 15

## 2017-10-10 MED ORDER — KETOROLAC TROMETHAMINE 30 MG/ML IJ SOLN
INTRAMUSCULAR | Status: DC | PRN
  Administered 2017-10-10: 30 mg via INTRAVENOUS

## 2017-10-10 MED ORDER — NEOSTIGMINE METHYLSULFATE 10 MG/10ML IV SOLN
INTRAVENOUS | Status: AC
  Filled 2017-10-10: qty 1

## 2017-10-10 MED ORDER — ACETAMINOPHEN 10 MG/ML IV SOLN: 1000.0000 mg | Freq: Once | INTRAVENOUS | Status: DC | PRN

## 2017-10-10 MED ORDER — ONDANSETRON HCL 4 MG PO TABS: 4.0000 mg | ORAL_TABLET | Freq: Four times a day (QID) | ORAL | Status: DC | PRN

## 2017-10-10 MED ORDER — GABAPENTIN 100 MG PO CAPS
100.0000 mg | ORAL_CAPSULE | Freq: Three times a day (TID) | ORAL | Status: DC
  Administered 2017-10-10 – 2017-10-11 (×3): 100 mg via ORAL
  Filled 2017-10-10 (×3): qty 1

## 2017-10-10 MED ORDER — CEFAZOLIN SODIUM-DEXTROSE 2-4 GM/100ML-% IV SOLN
INTRAVENOUS | Status: AC
  Filled 2017-10-10: qty 100

## 2017-10-10 MED ORDER — ZOLPIDEM TARTRATE 5 MG PO TABS: 5.0000 mg | ORAL_TABLET | Freq: Every evening | ORAL | Status: DC | PRN

## 2017-10-10 MED ORDER — SUGAMMADEX SODIUM 200 MG/2ML IV SOLN
INTRAVENOUS | Status: DC | PRN
  Administered 2017-10-10: 200 mg via INTRAVENOUS

## 2017-10-10 MED ORDER — PROPOFOL 10 MG/ML IV BOLUS
INTRAVENOUS | Status: AC
  Filled 2017-10-10: qty 20

## 2017-10-10 MED ORDER — KETOROLAC TROMETHAMINE 30 MG/ML IJ SOLN
INTRAMUSCULAR | Status: AC
Start: 2017-10-10 — End: ?
  Filled 2017-10-10: qty 1

## 2017-10-10 MED ORDER — PROPOFOL 10 MG/ML IV BOLUS
INTRAVENOUS | Status: DC | PRN
  Administered 2017-10-10: 200 mg via INTRAVENOUS

## 2017-10-10 MED ORDER — CEFAZOLIN SODIUM-DEXTROSE 2-4 GM/100ML-% IV SOLN
2.0000 g | INTRAVENOUS | Status: AC
  Administered 2017-10-10: 2 g via INTRAVENOUS

## 2017-10-10 MED ORDER — OXYCODONE-ACETAMINOPHEN 5-325 MG PO TABS
1.0000 | ORAL_TABLET | ORAL | Status: DC | PRN
  Administered 2017-10-10 – 2017-10-11 (×3): 1 via ORAL
  Filled 2017-10-10 (×3): qty 1

## 2017-10-10 MED ORDER — ONDANSETRON HCL 4 MG/2ML IJ SOLN
INTRAMUSCULAR | Status: AC
  Filled 2017-10-10: qty 2

## 2017-10-10 MED ORDER — MEPERIDINE HCL 25 MG/ML IJ SOLN: 6.2500 mg | INTRAMUSCULAR | Status: DC | PRN

## 2017-10-10 MED ORDER — HYDROMORPHONE HCL 1 MG/ML IJ SOLN
1.0000 mg | INTRAMUSCULAR | Status: DC | PRN
  Administered 2017-10-10: 1 mg via INTRAVENOUS
  Filled 2017-10-10: qty 1

## 2017-10-10 SURGICAL SUPPLY — 30 items
BLADE SURG 10 STRL SS (BLADE) ×4 IMPLANT
CANISTER SUCT 3000ML PPV (MISCELLANEOUS) ×4 IMPLANT
CLOTH BEACON ORANGE TIMEOUT ST (SAFETY) ×4 IMPLANT
CONT PATH 16OZ SNAP LID 3702 (MISCELLANEOUS) ×4 IMPLANT
DECANTER SPIKE VIAL GLASS SM (MISCELLANEOUS) IMPLANT
GLOVE BIO SURGEON STRL SZ7.5 (GLOVE) ×4 IMPLANT
GLOVE BIO SURGEON STRL SZ8 (GLOVE) ×4 IMPLANT
GLOVE BIOGEL PI IND STRL 6.5 (GLOVE) ×2 IMPLANT
GLOVE BIOGEL PI IND STRL 7.0 (GLOVE) ×4 IMPLANT
GLOVE BIOGEL PI INDICATOR 6.5 (GLOVE) ×2
GLOVE BIOGEL PI INDICATOR 7.0 (GLOVE) ×4
GOWN STRL REUS W/TWL LRG LVL3 (GOWN DISPOSABLE) ×16 IMPLANT
GOWN STRL REUS W/TWL XL LVL3 (GOWN DISPOSABLE) ×4 IMPLANT
NS IRRIG 1000ML POUR BTL (IV SOLUTION) ×4 IMPLANT
PACK TRENDGUARD 450 HYBRID PRO (MISCELLANEOUS) IMPLANT
PACK TRENDGUARD 600 HYBRD PROC (MISCELLANEOUS) IMPLANT
PACK VAGINAL WOMENS (CUSTOM PROCEDURE TRAY) ×4 IMPLANT
PACK WEDGE PROC TRENDGRD 600 (MISCELLANEOUS) IMPLANT
PAD OB MATERNITY 4.3X12.25 (PERSONAL CARE ITEMS) ×4 IMPLANT
SUT VIC AB 2-0 CT1 18 (SUTURE) IMPLANT
SUT VIC AB 2-0 CT1 27 (SUTURE) ×4
SUT VIC AB 2-0 CT1 TAPERPNT 27 (SUTURE) ×2 IMPLANT
SUT VIC AB PLUS 45CM 1-MO-4 (SUTURE) ×16 IMPLANT
SUT VICRYL 0 ENDOLOOP (SUTURE) IMPLANT
SUT VICRYL 1 TIES 12X18 (SUTURE) ×4 IMPLANT
TOWEL OR 17X24 6PK STRL BLUE (TOWEL DISPOSABLE) ×8 IMPLANT
TRAY FOLEY CATH SILVER 14FR (SET/KITS/TRAYS/PACK) ×4 IMPLANT
TRENDGRD 600 WEDGE PROC PACK (MISCELLANEOUS)
TRENDGUARD 450 HYBRID PRO PACK (MISCELLANEOUS)
TRENDGUARD 600 HYBRID PROC PK (MISCELLANEOUS)

## 2017-10-10 NOTE — Transfer of Care (Signed)
Immediate Anesthesia Transfer of Care Note  Patient: Caroline Ryan  Procedure(s) Performed: HYSTERECTOMY VAGINAL (N/A Vagina ) BILATERAL SALPINGECTOMY (Bilateral Vagina )  Patient Location: PACU  Anesthesia Type:General  Level of Consciousness: sedated  Airway & Oxygen Therapy: Patient Spontanous Breathing and Patient connected to nasal cannula oxygen  Post-op Assessment: Report given to RN and Post -op Vital signs reviewed and stable  Post vital signs: stable  Last Vitals:  Vitals:   10/10/17 0600  BP: (!) 171/94  Pulse: 96  Resp: 18  Temp: 36.7 C  SpO2: 100%    Last Pain:  Vitals:   10/10/17 0600  TempSrc: Oral      Patients Stated Pain Goal: 3 (80/22/33 6122)  Complications: No apparent anesthesia complications

## 2017-10-10 NOTE — Addendum Note (Signed)
Addendum  created 10/10/17 1647 by Hewitt Blade, CRNA   Sign clinical note

## 2017-10-10 NOTE — Anesthesia Procedure Notes (Signed)
Procedure Name: Intubation Date/Time: 10/10/2017 7:33 AM Performed by: Ignacia Bayley, CRNA Pre-anesthesia Checklist: Patient identified, Emergency Drugs available, Suction available and Patient being monitored Patient Re-evaluated:Patient Re-evaluated prior to induction Oxygen Delivery Method: Circle system utilized Preoxygenation: Pre-oxygenation with 100% oxygen Induction Type: IV induction Ventilation: Mask ventilation without difficulty Laryngoscope Size: Miller and 2 Grade View: Grade I Tube type: Oral Tube size: 7.0 mm Number of attempts: 1 Airway Equipment and Method: Stylet Placement Confirmation: ETT inserted through vocal cords under direct vision,  positive ETCO2 and breath sounds checked- equal and bilateral Secured at: 20 cm Tube secured with: Tape Dental Injury: Teeth and Oropharynx as per pre-operative assessment

## 2017-10-10 NOTE — Interval H&P Note (Signed)
History and Physical Interval Note:  10/10/2017 7:19 AM  Caroline Ryan  has presented today for surgery, with the diagnosis of Menorrhagia Fibroids  The various methods of treatment have been discussed with the patient and family. After consideration of risks, benefits and other options for treatment, the patient has consented to  Procedure(s): HYSTERECTOMY VAGINAL WITH SALPINGECTOMY (Bilateral) BILATERAL SALPINGECTOMY (Bilateral) as a surgical intervention .  The patient's history has been reviewed, patient examined, no change in status, stable for surgery.  I have reviewed the patient's chart and labs.  Questions were answered to the patient's satisfaction.     Chancy Milroy

## 2017-10-10 NOTE — Anesthesia Postprocedure Evaluation (Signed)
Anesthesia Post Note  Patient: Caroline Ryan  Procedure(s) Performed: HYSTERECTOMY VAGINAL (N/A Vagina ) BILATERAL SALPINGECTOMY (Bilateral Vagina )     Patient location during evaluation: [facility] Anesthesia Type: General Level of consciousness: awake and alert and oriented Pain management: pain level controlled Vital Signs Assessment: post-procedure vital signs reviewed and stable Respiratory status: spontaneous breathing and nonlabored ventilation Cardiovascular status: stable Postop Assessment: no apparent nausea or vomiting and adequate PO intake Anesthetic complications: no    Last Vitals:  Vitals:   10/10/17 1130 10/10/17 1530  BP: (!) 141/66 130/71  Pulse: 88 85  Resp: 16 17  Temp: 36.8 C 37.1 C  SpO2: 97% 98%    Last Pain:  Vitals:   10/10/17 1530  TempSrc: Oral  PainSc: 4    Pain Goal: Patients Stated Pain Goal: 3 (10/10/17 1530)               Jabier Mutton

## 2017-10-10 NOTE — Anesthesia Postprocedure Evaluation (Signed)
Anesthesia Post Note  Patient: Caroline Ryan  Procedure(s) Performed: HYSTERECTOMY VAGINAL (N/A Vagina ) BILATERAL SALPINGECTOMY (Bilateral Vagina )     Patient location during evaluation: PACU Anesthesia Type: General Level of consciousness: awake and alert Pain management: pain level controlled Vital Signs Assessment: post-procedure vital signs reviewed and stable Respiratory status: spontaneous breathing, nonlabored ventilation, respiratory function stable and patient connected to nasal cannula oxygen Cardiovascular status: blood pressure returned to baseline and stable Postop Assessment: no apparent nausea or vomiting Anesthetic complications: no    Last Vitals:  Vitals:   10/10/17 1130 10/10/17 1530  BP: (!) 141/66 130/71  Pulse: 88 85  Resp: 16 17  Temp: 36.8 C 37.1 C  SpO2: 97% 98%    Last Pain:  Vitals:   10/10/17 1530  TempSrc: Oral  PainSc: 4    Pain Goal: Patients Stated Pain Goal: 3 (10/10/17 1530)               Barnet Glasgow

## 2017-10-10 NOTE — Op Note (Addendum)
Caroline Ryan PROCEDURE DATE: 10/10/2017  PREOPERATIVE DIAGNOSIS:  Symptomatic fibroids, menorrhagia POSTOPERATIVE DIAGNOSIS:  Symptomatic fibroids, menorrhagia SURGEON:   Chancy Milroy M.D., FACOG ASSISTANT: Clovia Cuff, M.D. OPERATION:  Total Vaginal hysterectomy with bilateral salpingectomy ANESTHESIA:  General endotracheal.  INDICATIONS: The patient is a 52 y.o. [id number] with history of symptomatic uterine fibroids/menorrhagia. The patient made a decision to undergo definite surgical treatment. On the preoperative visit, the risks, benefits, indications, and alternatives of the procedure were reviewed with the patient.  On the day of surgery, the risks of surgery were again discussed with the patient including but not limited to: bleeding which may require transfusion or reoperation; infection which may require antibiotics; injury to bowel, bladder, ureters or other surrounding organs; need for additional procedures; thromboembolic phenomenon, incisional problems and other postoperative/anesthesia complications. Written informed consent was obtained.    OPERATIVE FINDINGS: A 12 week size uterus with normal tubes and ovaries bilaterally. Bowel thinly adhered to fundus of the uterus.  ESTIMATED BLOOD LOSS: 200 ml FLUIDS:  As recorded URINE OUTPUT:  300 ml of clear yellow urine. SPECIMENS:  Uterus and cervix sent to pathology COMPLICATIONS:  None immediate.  DESCRIPTION OF PROCEDURE:  The patient received intravenous antibiotics and had sequential compression devices applied to her lower extremities while in the preoperative area.  She was then taken to the operating room where general anesthesia was administered and was found to be adequate.  She was placed in the dorsal lithotomy position, and was prepped and draped in a sterile manner.  The patients bladder was drained and foley was left indwelling.  After an adequate timeout was performed, attention was turned to her pelvis.  A weighted  speculum was then placed in the vagina, and the posterior lip of the cervix were grasped  with tenaculum.  The posterior cul-de-sac was entered sharply without difficulty. Long bill weighted speculum was placed. Attention was turned to the anterior lip of the cervix which was grasped with tenaculum. The cervical mucosa was sharply circumscribed. The mucosa was dissected away, peritoneum was identified and entry was gained anteriorly. Verified by evidence of bowel.  Zeplin clamp was then used to clamp the uterosacral ligaments on either side.  They were then cut and sutured ligated with 0 Vicryl. Of note, all sutures used in this case were 0 Vicryl unless otherwise noted.   The cardinal ligaments were then clamped, cut and ligated. The uterine vessels and broad ligaments were then serially clamped with the Zeplin clamps, cut, and suture ligated on both sides.   Due to the size of the uterus, it was morcellated using a bivaluved technique. Thin filmy adhesions were noted at the fundus and these were sharply and bluntly dissected off the fundus.The final pedicle involving the IP was ligated with a free tie and then in a [place] fashion.  Repeat on the opposite side. The uterus was then delivered and sent to pathology.   After completion of the hysterectomy, the fallopian tube on the left side was grasped with a Babcock and Zeplin clamp, transected and suture ligated. This was repeated on the opposite side.  All pedicles from the uterosacral ligament to the cornua were examined. Additional sutures were placed as needed to obtain hemostasis. The peritoneum was then grasped in a purse string fashion with 2/0 Vicryl.   The vaginal cuff then reapproximated using figure of eight sutures care was given to incorporate the uterosacral pedicles bilaterally.  All instruments were then removed from the pelvis.  The patient  tolerated the procedure well.  All instruments, needles, and sponge counts were correct x 2. The patient was  taken to the recovery room in stable condition.

## 2017-10-11 ENCOUNTER — Telehealth: Payer: Self-pay

## 2017-10-11 ENCOUNTER — Encounter (HOSPITAL_COMMUNITY): Payer: Self-pay | Admitting: Obstetrics and Gynecology

## 2017-10-11 LAB — CBC
HCT: 27.2 % — ABNORMAL LOW (ref 36.0–46.0)
Hemoglobin: 8.8 g/dL — ABNORMAL LOW (ref 12.0–15.0)
MCH: 29.7 pg (ref 26.0–34.0)
MCHC: 32.4 g/dL (ref 30.0–36.0)
MCV: 91.9 fL (ref 78.0–100.0)
PLATELETS: 158 10*3/uL (ref 150–400)
RBC: 2.96 MIL/uL — ABNORMAL LOW (ref 3.87–5.11)
RDW: 17.9 % — ABNORMAL HIGH (ref 11.5–15.5)
WBC: 9.6 10*3/uL (ref 4.0–10.5)

## 2017-10-11 LAB — BASIC METABOLIC PANEL
ANION GAP: 7 (ref 5–15)
BUN: 14 mg/dL (ref 6–20)
CO2: 24 mmol/L (ref 22–32)
Calcium: 9.2 mg/dL (ref 8.9–10.3)
Chloride: 107 mmol/L (ref 101–111)
Creatinine, Ser: 0.6 mg/dL (ref 0.44–1.00)
GFR calc Af Amer: >60 mL/min (ref >60)
GLUCOSE: 110 mg/dL — AB (ref 65–99)
POTASSIUM: 3.6 mmol/L (ref 3.5–5.1)
Sodium: 138 mmol/L (ref 135–145)

## 2017-10-11 MED ORDER — OXYCODONE-ACETAMINOPHEN 5-325 MG PO TABS: 1.0000 | ORAL_TABLET | ORAL | 0 refills | Status: DC | PRN

## 2017-10-11 MED ORDER — DOCUSATE SODIUM 100 MG PO CAPS: 100.0000 mg | ORAL_CAPSULE | Freq: Two times a day (BID) | ORAL | 2 refills | Status: DC | PRN

## 2017-10-11 MED ORDER — OXYCODONE-ACETAMINOPHEN 5-325 MG PO TABS: 1.0000 | ORAL_TABLET | Freq: Four times a day (QID) | ORAL | 0 refills | Status: DC | PRN

## 2017-10-11 MED ORDER — IBUPROFEN 800 MG PO TABS: 800.0000 mg | ORAL_TABLET | Freq: Three times a day (TID) | ORAL | 1 refills | Status: DC

## 2017-10-11 MED FILL — IBUPROFEN 800 MG TABLET: 800 | 10 days supply | Qty: 30 | Fill #0

## 2017-10-11 NOTE — Telephone Encounter (Signed)
[facility] called to inform us that they cannot/Do Not fill controlled substance Rx; we need to send Rx to different Pharmacy. I called patient to see what is her alternative Pharmacy but got no answer and her VM is not set up.

## 2017-10-11 NOTE — Discharge Instructions (Signed)
Vaginal Hysterectomy, Care After °Refer to this sheet in the next few weeks. These instructions provide you with information about caring for yourself after your procedure. Your health care provider may also give you more specific instructions. Your treatment has been planned according to current medical practices, but problems sometimes occur. Call your health care provider if you have any problems or questions after your procedure. °What can I expect after the procedure? °After the procedure, it is common to have: °· Pain. °· Soreness and numbness in your incision areas. °· Vaginal bleeding and discharge. °· Constipation. °· Temporary problems emptying the bladder. °· Feelings of sadness or other emotions. ° °Follow these instructions at home: °Medicines °· Take over-the-counter and prescription medicines only as told by your health care provider. °· If you were prescribed an antibiotic medicine, take it as told by your health care provider. Do not stop taking the antibiotic even if you start to feel better. °· Do not drive or operate heavy machinery while taking prescription pain medicine. °Activity °· Return to your normal activities as told by your health care provider. Ask your health care provider what activities are safe for you. °· Get regular exercise as told by your health care provider. You may be told to take short walks every day and go farther each time. °· Do not lift anything that is heavier than 10 lb (4.5 kg). °General instructions ° °· Do not put anything in your vagina for 6 weeks after your surgery or as told by your health care provider. This includes tampons and douches. °· Do not have sex until your health care provider says you can. °· Do not take baths, swim, or use a hot tub until your health care provider approves. °· Drink enough fluid to keep your urine clear or pale yellow. °· Do not drive for 24 hours if you were given a sedative. °· Keep all follow-up visits as told by your health  care provider. This is important. °Contact a health care provider if: °· Your pain medicine is not helping. °· You have a fever. °· You have redness, swelling, or pain at your incision site. °· You have blood, pus, or a bad-smelling discharge from your vagina. °· You continue to have difficulty urinating. °Get help right away if: °· You have severe abdominal or back pain. °· You have heavy bleeding from your vagina. °· You have chest pain or shortness of breath. °This information is not intended to replace advice given to you by your health care provider. Make sure you discuss any questions you have with your health care provider. °Document Released: 11/28/2015 Document Revised: 01/12/2016 Document Reviewed: 08/21/2015 °[employer] Interactive Patient Education © 2018 [employer] ° °

## 2017-10-11 NOTE — Discharge Summary (Signed)
Physician Discharge Summary  Patient ID: Caroline Ryan MRN: [id number] DOB/AGE: 1966/07/18 52 y.o.  Admit date: 10/10/2017 Discharge date: 10/11/2017  Admission Diagnoses: Uterine Fibroids and menorraghia  Discharge Diagnoses:  Active Problems:   Post-operative state   Discharged Condition: good  Hospital Course: Pt was admitted for TVH/BS secondary to above DX. Pt underwent TVH/BS without problems. See OP note for additional information. Pt's post-op course was unremarkable. Progressed to ambulating, voiding, tolerating diet, passing flatus and good oral pain control. Felt amendable for discharge home on POD # 1. Post op discharge medications and instructions reviewed with the pt.  Consults: None  Significant Diagnostic Studies: labs  Treatments: surgery: TVH/BS  Discharge Exam: Blood pressure (!) 153/87, pulse 84, temperature 99.1 F (37.3 C), temperature source Oral, resp. rate 18, height 5\' 1"  (1.549 m), weight 229 lb (103.9 kg), last menstrual period 04/03/2017, SpO2 100 %. Lungs clear Heart RRR Abd soft + BS GU no bleeding Ext non tender  Disposition: 01-Home or Self Care  Discharge Instructions    Call MD for:  difficulty breathing, headache or visual disturbances   Complete by:  As directed    Call MD for:  extreme fatigue   Complete by:  As directed    Call MD for:  hives   Complete by:  As directed    Call MD for:  persistant dizziness or light-headedness   Complete by:  As directed    Call MD for:  persistant nausea and vomiting   Complete by:  As directed    Call MD for:  redness, tenderness, or signs of infection (pain, swelling, redness, odor or green/yellow discharge around incision site)   Complete by:  As directed    Call MD for:  severe uncontrolled pain   Complete by:  As directed    Call MD for:  temperature >100.4   Complete by:  As directed    Diet - low sodium heart healthy   Complete by:  As directed    Increase activity slowly   Complete by:   As directed      Allergies as of 10/11/2017   No Known Allergies     Medication List    STOP taking these medications   ferrous sulfate 325 (65 FE) MG tablet   hydrOXYzine 25 MG tablet Commonly known as:  ATARAX/VISTARIL   megestrol 40 MG tablet Commonly known as:  MEGACE   meloxicam 15 MG tablet Commonly known as:  MOBIC   traMADol 50 MG tablet Commonly known as:  ULTRAM     TAKE these medications   albuterol 108 (90 Base) MCG/ACT inhaler Commonly known as:  PROVENTIL HFA;VENTOLIN HFA Inhale 2 puffs into the lungs every 6 (six) hours as needed for wheezing or shortness of breath. Asthma attacks   cetirizine 10 MG tablet Commonly known as:  ZYRTEC ALLERGY Take 1 tablet (10 mg total) by mouth daily.   docusate sodium 100 MG capsule Commonly known as:  COLACE Take 1 capsule (100 mg total) by mouth daily as needed for mild constipation. What changed:  Another medication with the same name was added. Make sure you understand how and when to take each.   docusate sodium 100 MG capsule Commonly known as:  COLACE Take 1 capsule (100 mg total) by mouth 2 (two) times daily as needed. What changed:  You were already taking a medication with the same name, and this prescription was added. Make sure you understand how and when to take each.  gabapentin 100 MG capsule Commonly known as:  NEURONTIN Take 1 capsule (100 mg total) by mouth 3 (three) times daily.   ibuprofen 800 MG tablet Commonly known as:  ADVIL,MOTRIN Take 1 tablet (800 mg total) by mouth 3 (three) times daily.   lisinopril-hydrochlorothiazide 20-25 MG tablet Commonly known as:  PRINZIDE,ZESTORETIC Take 1 tablet by mouth daily.   montelukast 10 MG tablet Commonly known as:  SINGULAIR TAKE 1 TABLET (10 MG TOTAL) BY MOUTH AT BEDTIME.   oxyCODONE-acetaminophen 5-325 MG tablet Commonly known as:  PERCOCET/ROXICET Take 1 tablet by mouth every 4 (four) hours as needed for moderate pain ((when tolerating  fluids)).      Follow-up Information    [facility]. Schedule an appointment as soon as possible for a visit in 4 week(s).   Why:  Post Op appt with Dr. Gardiner Fanti Contact information: [address] [place] [address] [phone]          Signed: Chancy Milroy 10/11/2017, 8:43 AM

## 2017-10-11 NOTE — Progress Notes (Signed)
Discharge instructions reviewed with patient including follow up appointments, medication changes and  postoperative care.

## 2017-10-17 ENCOUNTER — Other Ambulatory Visit: Payer: Self-pay | Admitting: Family Medicine

## 2017-10-17 DIAGNOSIS — I10 Essential (primary) hypertension: Secondary | ICD-10-CM

## 2017-10-18 MED FILL — LISINOPRIL-HCTZ 20-25 MG TA: 20-25 | 30 days supply | Qty: 30 | Fill #0

## 2017-11-01 ENCOUNTER — Ambulatory Visit: Payer: Self-pay | Admitting: Family Medicine

## 2017-11-07 MED FILL — MELOXICAM 15 MG TABLET: 15 | 30 days supply | Qty: 30 | Fill #0

## 2017-11-13 ENCOUNTER — Ambulatory Visit: Payer: Medicaid Other | Attending: Family Medicine

## 2017-11-13 MED FILL — IBUPROFEN 800 MG TABLET: 800 | 10 days supply | Qty: 30 | Fill #1

## 2017-11-13 MED FILL — hydrOXYzine HCL 25 MG TABS: 25 | 7 days supply | Qty: 30 | Fill #2

## 2017-11-13 MED FILL — LISINOPRIL-HCTZ 20-25 MG TA: 20-25 | 30 days supply | Qty: 30 | Fill #1

## 2017-11-14 ENCOUNTER — Ambulatory Visit (INDEPENDENT_AMBULATORY_CARE_PROVIDER_SITE_OTHER): Payer: Self-pay | Admitting: Obstetrics and Gynecology

## 2017-11-14 ENCOUNTER — Encounter: Payer: Self-pay | Admitting: Obstetrics and Gynecology

## 2017-11-14 VITALS — BP 125/77 | HR 99 | Ht 66.0 in | Wt 233.3 lb

## 2017-11-14 DIAGNOSIS — Z9889 Other specified postprocedural states: Secondary | ICD-10-CM

## 2017-11-14 MED ORDER — METRONIDAZOLE 500 MG PO TABS: 500.0000 mg | ORAL_TABLET | Freq: Two times a day (BID) | ORAL | 0 refills | Status: DC

## 2017-11-14 MED FILL — metroNIDAZOLE 500 MG TABS: 500 | 7 days supply | Qty: 14 | Fill #0

## 2017-11-14 NOTE — Progress Notes (Signed)
Patient ID: Caroline Ryan, female   DOB: Aug 28, 1965, 52 y.o.   MRN: [id number] Caroline Ryan presents for post op appt. S/P TVH/BS on 10/10/17 Doing well. NO bowel or bladder dysfunction. Some vaginal discharge Tolerating diet Pathology reviewed with pt  PE AF VSS Lungs clear Heart RRR Abd soft + bs GU nl EGBUS tanish discharge cuff healing well no masses or tenderness  A/P Post Op TVH/BS  Doing well. Metrogel for vaginal discharge. Return to nl ADL's. F/U in 1 yr or PRN

## 2017-11-14 NOTE — Patient Instructions (Signed)

## 2017-11-15 ENCOUNTER — Encounter: Payer: Self-pay | Admitting: Family Medicine

## 2017-11-15 ENCOUNTER — Ambulatory Visit (INDEPENDENT_AMBULATORY_CARE_PROVIDER_SITE_OTHER): Payer: Self-pay | Admitting: Family Medicine

## 2017-11-15 VITALS — BP 138/82 | HR 95 | Temp 97.9°F | Resp 16 | Ht 61.0 in | Wt 235.0 lb

## 2017-11-15 DIAGNOSIS — E8881 Metabolic syndrome: Secondary | ICD-10-CM

## 2017-11-15 DIAGNOSIS — I10 Essential (primary) hypertension: Secondary | ICD-10-CM

## 2017-11-15 DIAGNOSIS — Z6841 Body Mass Index (BMI) 40.0 and over, adult: Secondary | ICD-10-CM

## 2017-11-15 DIAGNOSIS — M25562 Pain in left knee: Secondary | ICD-10-CM

## 2017-11-15 DIAGNOSIS — M25561 Pain in right knee: Secondary | ICD-10-CM

## 2017-11-15 DIAGNOSIS — G8929 Other chronic pain: Secondary | ICD-10-CM

## 2017-11-15 LAB — POCT GLYCOSYLATED HEMOGLOBIN (HGB A1C): HEMOGLOBIN A1C: 4.6

## 2017-11-15 MED ORDER — MELOXICAM 15 MG PO TABS: 15.0000 mg | ORAL_TABLET | Freq: Every day | ORAL | 0 refills | Status: DC

## 2017-11-15 MED ORDER — PREDNISONE 20 MG PO TABS: 20.0000 mg | ORAL_TABLET | Freq: Every day | ORAL | 0 refills | Status: DC

## 2017-11-15 MED FILL — predniSONE 20 MG TABS: 20 | 5 days supply | Qty: 5 | Fill #0

## 2017-11-15 NOTE — Progress Notes (Signed)
Subjective:   Chief Complaint  Patient presents with  . Knee Pain     Caroline Ryan is a 52 y.o. female with a history of hypertension and chronic knee pain presents complaining of worsening bilateral knee pain over the past week. Patient states that she does not have transportation and has been walking long distances to the bus stop. She states that walking uphill several times per day has worsened knee pain. She says that knee pain is aggravated by weight bearing. She denies any previous injury. She has been taking OTC analgesics without sustained relief. She last had Ibuprofen around 9 pm last night. Current pain intensity 5/10 characterized as constant and throbbing.  Past Medical History:  Diagnosis Date  . Anemia   . Asthma   . Depression    no meds  . Fibroid   . Headache(784.0)    otc prn med  . History of blood transfusion   . Hypertension   . Knee pain, bilateral    arthralgia knee pain bilateral  . Seasonal allergies   . SVD (spontaneous vaginal delivery)    x 3   Social History   Socioeconomic History  . Marital status: Single    Spouse name: Not on file  . Number of children: Not on file  . Years of education: Not on file  . Highest education level: Not on file  Occupational History  . Not on file  Social Needs  . Financial resource strain: Not on file  . Food insecurity:    Worry: Not on file    Inability: Not on file  . Transportation needs:    Medical: Not on file    Non-medical: Not on file  Tobacco Use  . Smoking status: Former Smoker    Packs/day: 0.25    Years: 2.00    Pack years: 0.50    Types: Cigarettes    Last attempt to quit: 08/20/2014    Years since quitting: 3.2  . Smokeless tobacco: Never Used  Substance and Sexual Activity  . Alcohol use: Yes    Alcohol/week: 1.2 oz    Types: 2 Glasses of wine per week  . Drug use: No  . Sexual activity: Yes    Birth control/protection: Condom  Lifestyle  . Physical activity:    Days per week:  Not on file    Minutes per session: Not on file  . Stress: Not on file  Relationships  . Social connections:    Talks on phone: Not on file    Gets together: Not on file    Attends religious service: Not on file    Active member of club or organization: Not on file    Attends meetings of clubs or organizations: Not on file    Relationship status: Not on file  . Intimate partner violence:    Fear of current or ex partner: Not on file    Emotionally abused: Not on file    Physically abused: Not on file    Forced sexual activity: Not on file  Other Topics Concern  . Not on file  Social History Narrative  . Not on file   Immunization History  Administered Date(s) Administered  . Influenza Whole 06/30/2009  . Influenza,inj,Quad PF,6+ Mos 06/27/2015, 07/04/2016  . Pneumococcal Polysaccharide-23 04/14/2008, 06/27/2015  . Td 10/18/2005  . Tdap 07/04/2016    Review of Systems  Constitutional: Negative.   HENT: Negative.   Eyes: Negative.   Respiratory: Negative.   Cardiovascular: Negative.  Gastrointestinal: Negative.   Genitourinary: Negative.   Musculoskeletal: Positive for joint pain (bilateral knees).       Knee pain and swelling  Neurological: Negative.   Psychiatric/Behavioral: Negative.    Objective:    BP (!) 142/60 (BP Location: Right Arm, Patient Position: Sitting, Cuff Size: Large) Comment: manually  Pulse 95   Temp 97.9 F (36.6 C) (Oral)   Resp 16   Ht 5\' 1"  (1.549 m)   Wt 235 lb (106.6 kg)   LMP 04/03/2017 (Approximate)   SpO2 99%   BMI 44.40 kg/m   Physical Exam  Constitutional: She is well-developed, well-nourished, and in no distress.  Cardiovascular: Normal rate, regular rhythm, normal heart sounds and intact distal pulses.  Pulmonary/Chest: Effort normal and breath sounds normal.  Abdominal: Soft. Bowel sounds are normal.  Musculoskeletal:       Right knee: She exhibits decreased range of motion and swelling.       Left knee: She exhibits  decreased range of motion and swelling.   Assessment:     Moderate osteoarthritis bilaterally   BP 138/82 Comment: manually  Pulse 95   Temp 97.9 F (36.6 C) (Oral)   Resp 16   Ht 5\' 1"  (1.549 m)   Wt 235 lb (106.6 kg)   LMP 04/03/2017 (Approximate)   SpO2 99%   BMI 44.40 kg/m  Plan:     1. Arthralgia of both knees - meloxicam (MOBIC) 15 MG tablet; Take 1 tablet (15 mg total) by mouth daily.  Dispense: 30 tablet; Refill: 0 - predniSONE (DELTASONE) 20 MG tablet; Take 1 tablet (20 mg total) by mouth daily with breakfast.  Dispense: 5 tablet; Refill: 0  2. Class 3 severe obesity due to excess calories with serious comorbidity and body mass index (BMI) of 40.0 to 44.9 in adult [facility]) Body mass index is 44.4 kg/m. Recommend a lowfat, low carbohydrate diet divided over 5-6 small meals, increase water intake to 6-8 glasses, and 150 minutes per week of cardiovascular exercise.   - Lipid Panel - TSH - HgB X4H - Basic Metabolic Panel  3. HYPERTENSION, BENIGN ESSENTIAL Blood pressure is at goal on current medication regimen, no medication changes warranted on today.  Will review renal functioning as results become available.   4. Metabolic syndrome - Lipid Panel  5. Bilateral chronic knee pain - meloxicam (MOBIC) 15 MG tablet; Take 1 tablet (15 mg total) by mouth daily.  Dispense: 30 tablet; Refill: 0 - predniSONE (DELTASONE) 20 MG tablet; Take 1 tablet (20 mg total) by mouth daily with breakfast.  Dispense: 5 tablet; Refill: 0   RTC: 3 months for chronic conditions   [name]  MSN, FNP-C Patient [facility] [address] [place], [place] [address] [phone]

## 2017-11-15 NOTE — Patient Instructions (Addendum)
Your blood pressure is above goal on current medication regimen.  Will continue lisinopril hydrochlorothiazide 20-25 mg daily.  Limit her sodium intake to 1200 mg/day increase water intake, will have her water goal salt will follow.   We will follow-up by phone with any abnormal laboratory results. Reduce her portion sizes and divide her meals into 5-6 small meals throughout the day. Increase your daily activity level   For bilateral knee pain, will start a trial of prednisone 20 mg daily for 5 days.  We will also continue meloxicam 15 mg daily with food.  Meloxicam is not a long-term medication.  You warrant a referral to orthopedic services for further workup and evaluation.  Notify the clinic your payer source becomes available and I will send referral at that time.  Refrain from taking NSAIDs while on meloxicam due to possibility of kidney toxicity.

## 2017-11-16 LAB — BASIC METABOLIC PANEL
BUN/Creatinine Ratio: 31 — ABNORMAL HIGH (ref 9–23)
BUN: 18 mg/dL (ref 6–24)
CALCIUM: 9.8 mg/dL (ref 8.7–10.2)
CO2: 24 mmol/L (ref 20–29)
CREATININE: 0.59 mg/dL (ref 0.57–1.00)
Chloride: 103 mmol/L (ref 96–106)
GFR calc Af Amer: 121 mL/min/{1.73_m2} (ref >59)
GFR, EST NON AFRICAN AMERICAN: 105 mL/min/{1.73_m2} (ref >59)
Glucose: 87 mg/dL (ref 65–99)
Potassium: 3.7 mmol/L (ref 3.5–5.2)
Sodium: 141 mmol/L (ref 134–144)

## 2017-11-16 LAB — LIPID PANEL
CHOLESTEROL TOTAL: 179 mg/dL (ref 100–199)
Chol/HDL Ratio: 2.5 ratio (ref 0.0–4.4)
HDL: 73 mg/dL (ref >39)
LDL CALC: 89 mg/dL (ref 0–99)
TRIGLYCERIDES: 83 mg/dL (ref 0–149)
VLDL Cholesterol Cal: 17 mg/dL (ref 5–40)

## 2017-11-16 LAB — TSH: TSH: 1.45 u[IU]/mL (ref 0.450–4.500)

## 2017-11-19 ENCOUNTER — Telehealth: Payer: Self-pay

## 2017-11-19 NOTE — Telephone Encounter (Signed)
Patient called, she has not started the prednisone yet but wants to ask if you can prescribe the Etodolac? She states she would prefer to do this because she is scared of weight gain with the prednisone. Please advise if this can be done. Thanks!

## 2017-11-19 NOTE — Telephone Encounter (Signed)
-----   Message from Dorena Dew, [place] sent at 11/16/2017 10:35 AM EDT ----- Regarding: lab results Please inform patient that labs are consistent with baseline. No further medication changes warranted at this time.   [name]  MSN, FNP-C Patient [facility] [address] [place], [place] [address] [phone]

## 2017-11-19 NOTE — Telephone Encounter (Signed)
Called, no answer. Left a message that all labs are consistent with baseline and no further medication changes are needed at this time. Asked if any questions to call back to our office. Thanks!

## 2017-11-20 ENCOUNTER — Telehealth: Payer: Self-pay

## 2017-11-20 NOTE — Telephone Encounter (Signed)
-----   Message from Dorena Dew, [place] sent at 11/19/2017  1:09 PM EDT ----- Regarding: lab results No, I will not prescribe requested medication. Patient is already taking an NSAID. She is only on limited predinisone. She will not be taking for a long period of time. Weight gain is not a valid concern.   [name]  MSN, FNP-C Patient [facility] [address] [place], [place] [address] [phone]

## 2017-11-20 NOTE — Telephone Encounter (Signed)
Called and left a message to advise patient that we will not be prescribing the Etodolac and that she is already taking an NSAID and that the prednisone is only to be taken for a short term and weight gain is not a valid concern with short term. Thanks!

## 2017-12-02 MED FILL — MELOXICAM 15 MG TABLET: 15 | 30 days supply | Qty: 30 | Fill #0

## 2017-12-02 MED FILL — ?CETIRIZINE HCL 10 MG TABLE: 10 | 30 days supply | Qty: 30 | Fill #1

## 2017-12-12 MED FILL — ?MONTELUKAST SOD 10 MG TAB: 10 | 30 days supply | Qty: 30 | Fill #1

## 2017-12-12 MED FILL — LISINOPRIL-HCTZ 20-25 MG TA: 20-25 | 30 days supply | Qty: 30 | Fill #2

## 2018-01-06 ENCOUNTER — Other Ambulatory Visit: Payer: Self-pay | Admitting: Family Medicine

## 2018-01-06 DIAGNOSIS — Z1231 Encounter for screening mammogram for malignant neoplasm of breast: Secondary | ICD-10-CM

## 2018-01-09 MED FILL — LISINOPRIL-HCTZ 20-25 MG TA: 20-25 | 30 days supply | Qty: 30 | Fill #3

## 2018-01-09 MED FILL — hydrOXYzine HCL 25 MG TABS: 25 | 7 days supply | Qty: 30 | Fill #3

## 2018-01-09 MED FILL — ?MONTELUKAST SODI 10MG TAB: 10 | 30 days supply | Qty: 30 | Fill #2

## 2018-01-10 ENCOUNTER — Emergency Department (HOSPITAL_COMMUNITY)
Admission: EM | Admit: 2018-01-10 | Discharge: 2018-01-11 | Disposition: A | Discharge status: Home / Self Care | Payer: Medicaid Other | Attending: Emergency Medicine | Admitting: Emergency Medicine

## 2018-01-10 ENCOUNTER — Other Ambulatory Visit: Payer: Self-pay

## 2018-01-10 ENCOUNTER — Encounter (HOSPITAL_COMMUNITY): Payer: Self-pay

## 2018-01-10 DIAGNOSIS — F419 Anxiety disorder, unspecified: Secondary | ICD-10-CM | POA: Insufficient documentation

## 2018-01-10 DIAGNOSIS — J45909 Unspecified asthma, uncomplicated: Secondary | ICD-10-CM | POA: Insufficient documentation

## 2018-01-10 DIAGNOSIS — Z87891 Personal history of nicotine dependence: Secondary | ICD-10-CM | POA: Insufficient documentation

## 2018-01-10 DIAGNOSIS — T7840XA Allergy, unspecified, initial encounter: Secondary | ICD-10-CM | POA: Insufficient documentation

## 2018-01-10 DIAGNOSIS — I10 Essential (primary) hypertension: Secondary | ICD-10-CM | POA: Insufficient documentation

## 2018-01-10 DIAGNOSIS — Z79899 Other long term (current) drug therapy: Secondary | ICD-10-CM | POA: Insufficient documentation

## 2018-01-10 MED ORDER — FAMOTIDINE 20 MG PO TABS
20.0000 mg | ORAL_TABLET | Freq: Once | ORAL | Status: AC
  Administered 2018-01-11: 20 mg via ORAL
  Filled 2018-01-10: qty 1

## 2018-01-10 MED ORDER — DIPHENHYDRAMINE HCL 25 MG PO CAPS
50.0000 mg | ORAL_CAPSULE | Freq: Once | ORAL | Status: AC
  Administered 2018-01-11: 50 mg via ORAL
  Filled 2018-01-10: qty 2

## 2018-01-10 MED ORDER — METHYLPREDNISOLONE SODIUM SUCC 125 MG IJ SOLR
125.0000 mg | Freq: Once | INTRAMUSCULAR | Status: AC
  Administered 2018-01-11: 125 mg via INTRAVENOUS
  Filled 2018-01-10: qty 2

## 2018-01-10 NOTE — ED Triage Notes (Signed)
Pt reports that she got her hair colored today and since has been breaking out around her face, swelling and itching, denies SOB, pt has had this happen before with the same hair dye.

## 2018-01-11 MED ORDER — PREDNISONE 10 MG PO TABS: 20.0000 mg | ORAL_TABLET | Freq: Two times a day (BID) | ORAL | 0 refills | Status: AC

## 2018-01-11 MED ORDER — CETIRIZINE HCL 10 MG PO TABS: 10.0000 mg | ORAL_TABLET | Freq: Every day | ORAL | 0 refills | Status: DC | PRN

## 2018-01-11 MED ORDER — DIPHENHYDRAMINE HCL 25 MG PO TABS: 25.0000 mg | ORAL_TABLET | Freq: Four times a day (QID) | ORAL | 0 refills | Status: DC | PRN

## 2018-01-11 MED ORDER — FAMOTIDINE 20 MG PO TABS: 20.0000 mg | ORAL_TABLET | Freq: Two times a day (BID) | ORAL | 0 refills | Status: DC | PRN

## 2018-01-11 NOTE — ED Provider Notes (Signed)
[facility] EMERGENCY DEPARTMENT Provider Note   CSN: [id number] Arrival date & time: 01/10/18  2316     History   Chief Complaint Chief Complaint  Patient presents with  . Allergic Reaction    HPI Caroline Ryan is a 52 y.o. female with history of hypertension, asthma presents for evaluation of acute onset, per aggressively worsening pruritic rash to the scalp as well as facial swelling since today.  She states that yesterday she dyed her hair with a new hair dye.  Today on her way to work she noted development of a burning itching sensation to the scalp and noticed that she broke out into a rash.  She feels as though her face is swollen particularly around her cheeks and eyes.  She denies swelling of the lips or tongue.  She denies difficulty swallowing, shortness of breath, chest pain, nausea, or vomiting.  She has not tried anything for her symptoms.  She notes that she had a similar reaction after dying her hair 3 years ago.  She denies any new soaps, shampoos, detergents, or lotions otherwise.  The history is provided by the patient.    Past Medical History:  Diagnosis Date  . Anemia   . Asthma   . Depression    no meds  . Fibroid   . Headache(784.0)    otc prn med  . History of blood transfusion   . Hypertension   . Knee pain, bilateral    arthralgia knee pain bilateral  . Seasonal allergies   . SVD (spontaneous vaginal delivery)    x 3    Patient Active Problem List   Diagnosis Date Noted  . Post-operative state 10/10/2017  . Symptomatic anemia 05/20/2017  . Arthralgia of both knees 06/27/2015  . BACK STRAIN, LUMBAR 03/16/2010  . KNEE PAIN, BILATERAL 06/30/2009  . OBESITY 04/13/2009  . EDEMA 04/13/2009  . [place], BREAST 04/20/2008  . HYPERTENSION, BENIGN ESSENTIAL 04/14/2008  . Allergic rhinitis 04/14/2008  . Asthma 04/14/2008  . LYMPHADENOPATHY, LEFT AXILLA 04/14/2008    Past Surgical History:  Procedure Laterality Date  .  BILATERAL SALPINGECTOMY Bilateral 10/10/2017   Procedure: BILATERAL SALPINGECTOMY;  Surgeon: Chancy Milroy, MD;  Location: [facility];  Service: Gynecology;  Laterality: Bilateral;  . BREAST BIOPSY     US guided core 2009  . BREAST EXCISIONAL BIOPSY     right 1982 left 1981  . BREAST SURGERY     bilateral cysts/benign  . VAGINAL HYSTERECTOMY N/A 10/10/2017   Procedure: HYSTERECTOMY VAGINAL;  Surgeon: Chancy Milroy, MD;  Location: [facility];  Service: Gynecology;  Laterality: N/A;     OB History    Gravida  3   Para  3   Term  3   Preterm  0   AB  0   Living  3     SAB  0   TAB  0   Ectopic  0   Multiple  0   Live Births               Home Medications    Prior to Admission medications   Medication Sig Start Date End Date Taking? Authorizing Provider  albuterol (PROVENTIL HFA;VENTOLIN HFA) 108 (90 Base) MCG/ACT inhaler Inhale 2 puffs into the lungs every 6 (six) hours as needed for wheezing or shortness of breath. Asthma attacks 11/06/16   Dorena Dew, FNP  cetirizine (ZYRTEC ALLERGY) 10 MG tablet Take 1 tablet (10 mg total) by mouth daily as  needed (itching). 01/11/18   Nils Flack, [place] Liz A, PA-C  diphenhydrAMINE (BENADRYL) 25 MG tablet Take 1 tablet (25 mg total) by mouth every 6 (six) hours as needed for itching. 01/11/18   Nils Flack, Yair Dusza A, PA-C  docusate sodium (COLACE) 100 MG capsule Take 1 capsule (100 mg total) by mouth daily as needed for mild constipation. 06/20/17   Dorena Dew, FNP  docusate sodium (COLACE) 100 MG capsule Take 1 capsule (100 mg total) by mouth 2 (two) times daily as needed. 10/11/17   Chancy Milroy, MD  famotidine (PEPCID) 20 MG tablet Take 1 tablet (20 mg total) by mouth 2 (two) times daily as needed (itching). 01/11/18   Lazarus Sudbury A, PA-C  gabapentin (NEURONTIN) 100 MG capsule Take 1 capsule (100 mg total) by mouth 3 (three) times daily. Patient not taking: Reported on 11/14/2017 06/20/17   Dorena Dew, FNP  ibuprofen (ADVIL,MOTRIN)  800 MG tablet Take 1 tablet (800 mg total) by mouth 3 (three) times daily. 10/11/17   Chancy Milroy, MD  lisinopril-hydrochlorothiazide (PRINZIDE,ZESTORETIC) 20-25 MG tablet TAKE 1 TABLET BY MOUTH DAILY. 10/17/17   Dorena Dew, FNP  meloxicam (MOBIC) 15 MG tablet Take 1 tablet (15 mg total) by mouth daily. 11/15/17   Dorena Dew, FNP  montelukast (SINGULAIR) 10 MG tablet TAKE 1 TABLET (10 MG TOTAL) BY MOUTH AT BEDTIME. 09/30/17   Dorena Dew, FNP  predniSONE (DELTASONE) 10 MG tablet Take 2 tablets (20 mg total) by mouth 2 (two) times daily with a meal for 5 days. 01/11/18 01/16/18  Renita Papa, PA-C    Family History Family History  Problem Relation Age of Onset  . Hypertension Father   . Diabetes Brother   . Diabetes Brother   . Other Neg Hx     Social History Social History   Tobacco Use  . Smoking status: Former Smoker    Packs/day: 0.25    Years: 2.00    Pack years: 0.50    Types: Cigarettes    Last attempt to quit: 08/20/2014    Years since quitting: 3.3  . Smokeless tobacco: Never Used  Substance Use Topics  . Alcohol use: Yes    Alcohol/week: 1.2 oz    Types: 2 Glasses of wine per week  . Drug use: No     Allergies   Patient has no known allergies.   Review of Systems Review of Systems  Constitutional: Negative for chills and fever.  HENT: Positive for facial swelling. Negative for drooling, sore throat and trouble swallowing.   Respiratory: Negative for shortness of breath.   Cardiovascular: Negative for chest pain.  Gastrointestinal: Negative for abdominal pain, nausea and vomiting.  Skin: Positive for rash.     Physical Exam Updated Vital Signs BP (!) 158/122 (BP Location: Right Arm)   Pulse (!) 105   Temp 98.3 F (36.8 C) (Oral)   Resp 20   LMP 04/03/2017 (Approximate)   SpO2 100%   Physical Exam  Constitutional: She appears well-developed and well-nourished. No distress.  Appears quite anxious, resting in chair  HENT:  Head:  Normocephalic and atraumatic.  Diffuse raised erythematous urticarial rash to the scalp with some desquamation.  Mildly tender to palpation.  There is very mild edema around the cheeks bilaterally.  There is no swelling of the lips, tongue, or uvula.  The patient is tolerating secretions without difficulty.  No upper airway stridor.  Eyes: Pupils are equal, round, and reactive to light. Conjunctivae and  EOM are normal. Right eye exhibits no discharge. Left eye exhibits no discharge.  Neck: Normal range of motion. Neck supple. No JVD present. No tracheal deviation present.  Cardiovascular: Normal rate, regular rhythm, normal heart sounds and intact distal pulses.  Pulmonary/Chest: Effort normal and breath sounds normal.  Abdominal: Soft. Bowel sounds are normal. There is no tenderness. There is no guarding.  Musculoskeletal: Normal range of motion. She exhibits no edema.  Neurological: She is alert.  Skin: Skin is warm and dry. Rash noted. No erythema.  Psychiatric: She has a normal mood and affect. Her behavior is normal.  Nursing note and vitals reviewed.    ED Treatments / Results  Labs (all labs ordered are listed, but only abnormal results are displayed) Labs Reviewed - No data to display  EKG None  Radiology No results found.  Procedures Procedures (including critical care time)  Medications Ordered in ED Medications  methylPREDNISolone sodium succinate (SOLU-MEDROL) 125 mg/2 mL injection 125 mg (125 mg Intravenous Given 01/11/18 0007)  diphenhydrAMINE (BENADRYL) capsule 50 mg (50 mg Oral Given 01/11/18 0005)  famotidine (PEPCID) tablet 20 mg (20 mg Oral Given 01/11/18 0005)     Initial Impression / Assessment and Plan / ED Course  I have reviewed the triage vital signs and the nursing notes.  Pertinent labs & imaging results that were available during my care of the patient were reviewed by me and considered in my medical decision making (see chart for details).      Patient presents for evaluation of rash with swelling around the cheeks and ears which began today after dying her hair yesterday.  Patient is mildly tachycardic on triage but on my examination is not tachycardic.  She does appear to be quite anxious regarding her current symptoms.  No evidence of angioedema and she is tolerating secretions without difficulty.  No shortness of breath or difficulty breathing.  The airway appears patent and she is able to tolerate p.o. fluids without difficulty.  She has had similar reaction in the past after dying her hair which improved with prednisone and Zyrtec.  No evidence of anaphylaxis or multisystem involvement.  Patient was given Solu-Medrol, Pepcid, and Benadryl in the ED and on reevaluation she is resting comfortably and states she feels much better. No blisters, no pustules, no warmth, no draining sinus tracts, no superficial abscesses, no bullous impetigo, no vesicles, no desquamation, no target lesions with dusky purpura or a central bulla. Not tender to touch. No concern for superimposed infection. No concern for SJS, TEN, TSS, tick borne illness, syphilis or other life-threatening condition. Will discharge home with short course of steroids, pepcid and recommend Zyrtec every morning and Benadryl nightly as needed for pruritis.  Discussed strict ED return precautions.  Recommend follow-up with primary care physician for reevaluation of symptoms in the next 2 to 3 days. Pt verbalized understanding of and agreement with plan and is safe for discharge home at this time.  Final Clinical Impressions(s) / ED Diagnoses   Final diagnoses:  Allergic reaction, initial encounter    ED Discharge Orders        Ordered    cetirizine (ZYRTEC ALLERGY) 10 MG tablet  Daily PRN     01/11/18 0045    diphenhydrAMINE (BENADRYL) 25 MG tablet  Every 6 hours PRN     01/11/18 0045    predniSONE (DELTASONE) 10 MG tablet  2 times daily with meals     01/11/18 0045     famotidine (PEPCID) 20  MG tablet  2 times daily PRN     01/11/18 0045       Renita Papa, PA-C 01/11/18 [id number]    Isla Pence, MD 01/11/18 [phone]

## 2018-01-11 NOTE — Discharge Instructions (Signed)
Start taking prednisone as prescribed beginning tomorrow.  You received your first dose in the emergency department today.  You can take Zyrtec once daily for itching.  You may take Benadryl up to 4 times daily as needed for itching but I typically recommend only taking this at night as it may make you drowsy.  You may also take Pepcid twice daily in addition to the above medications for itching.  Avoid using hair dye which can cause further reaction.  Use gentle moisturizers/shampoos that will not irritate the skin.  Follow-up with your primary care physician for reevaluation in the next 2 to 3 days.  Return to the emergency department immediately for any concerning signs or symptoms develop such as swelling of the lips or tongue, difficulty breathing or swallowing, drooling, fevers, or chest pain.

## 2018-01-17 ENCOUNTER — Other Ambulatory Visit: Payer: Self-pay | Admitting: Family Medicine

## 2018-01-17 DIAGNOSIS — M25562 Pain in left knee: Principal | ICD-10-CM

## 2018-01-17 DIAGNOSIS — G8929 Other chronic pain: Secondary | ICD-10-CM

## 2018-01-17 DIAGNOSIS — M25561 Pain in right knee: Secondary | ICD-10-CM

## 2018-01-24 ENCOUNTER — Other Ambulatory Visit: Payer: Self-pay | Admitting: Family Medicine

## 2018-01-24 DIAGNOSIS — Z1231 Encounter for screening mammogram for malignant neoplasm of breast: Secondary | ICD-10-CM

## 2018-01-29 ENCOUNTER — Other Ambulatory Visit: Payer: Self-pay | Admitting: Family Medicine

## 2018-01-29 DIAGNOSIS — L299 Pruritus, unspecified: Secondary | ICD-10-CM

## 2018-02-14 ENCOUNTER — Ambulatory Visit: Payer: Self-pay | Admitting: Family Medicine

## 2018-02-19 ENCOUNTER — Ambulatory Visit: Payer: Self-pay | Admitting: Family Medicine

## 2018-02-24 ENCOUNTER — Ambulatory Visit
Admission: RE | Admit: 2018-02-24 | Discharge: 2018-02-24 | Disposition: A | Discharge status: Home / Self Care | Payer: No Typology Code available for payment source | Source: Ambulatory Visit | Attending: Family Medicine | Admitting: Family Medicine

## 2018-02-24 DIAGNOSIS — Z1231 Encounter for screening mammogram for malignant neoplasm of breast: Secondary | ICD-10-CM

## 2018-02-24 IMAGING — MG DIGITAL SCREENING BILATERAL MAMMOGRAM WITH TOMO AND CAD
8 series · 8 of 24 positions shown · non-contrast
Comparison: Previous exam(s).

CLINICAL DATA: Screening.

EXAM:
DIGITAL SCREENING BILATERAL MAMMOGRAM WITH TOMO AND CAD

[R CC synth-2D]
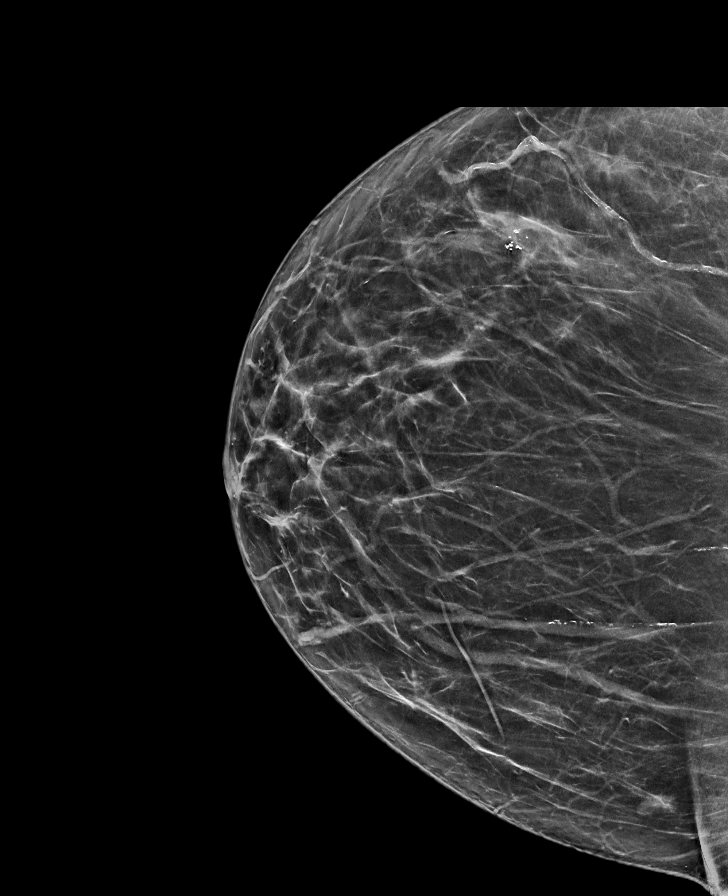

[R MLO synth-2D]
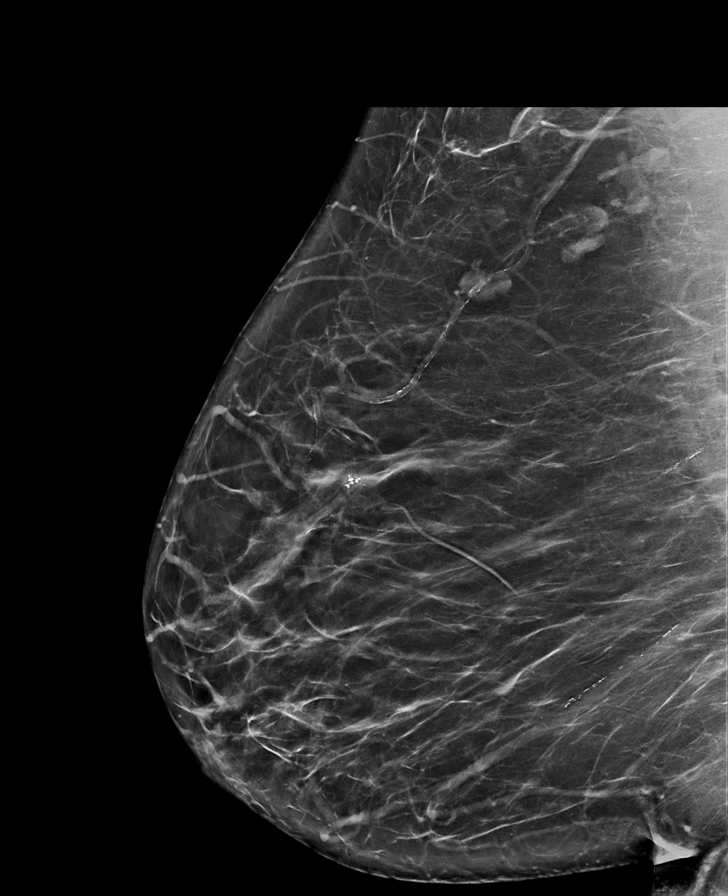

[L CC synth-2D]
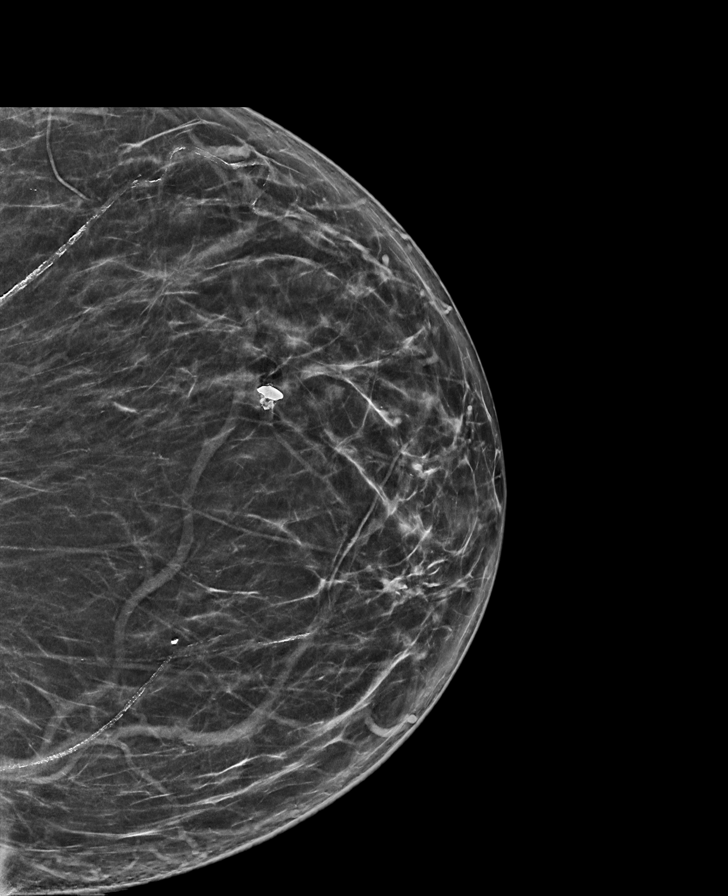

[L MLO synth-2D]
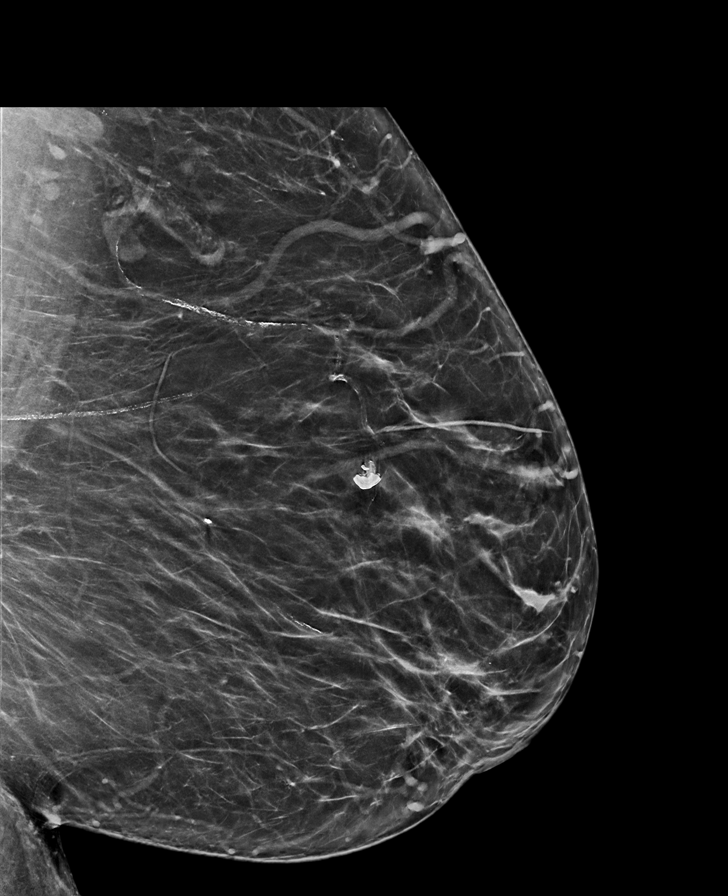

[L CC tomo · tomo slice 39/76.0]
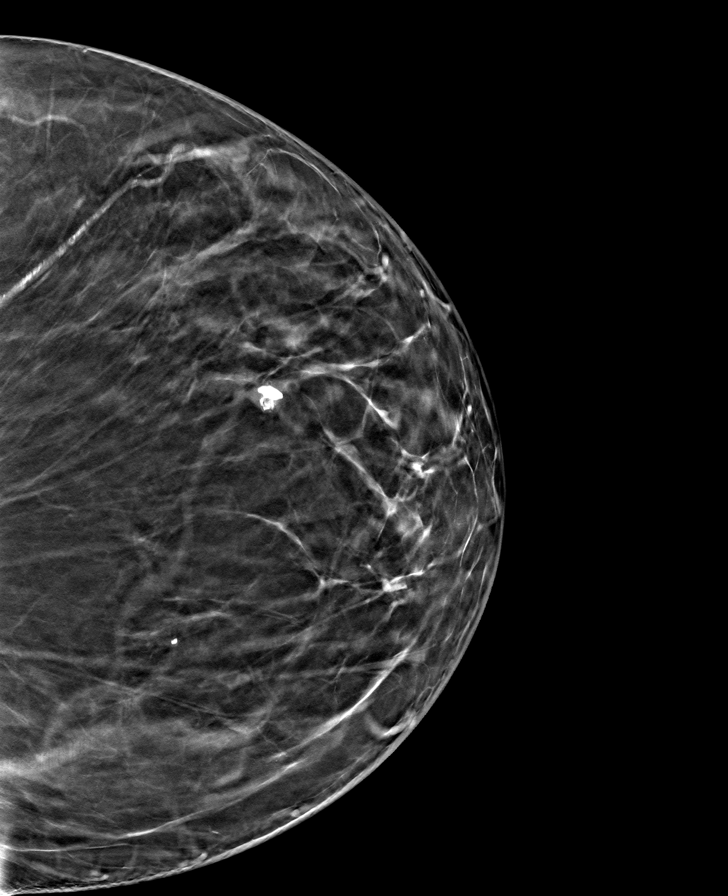

[R CC tomo · tomo slice 40/79.0]
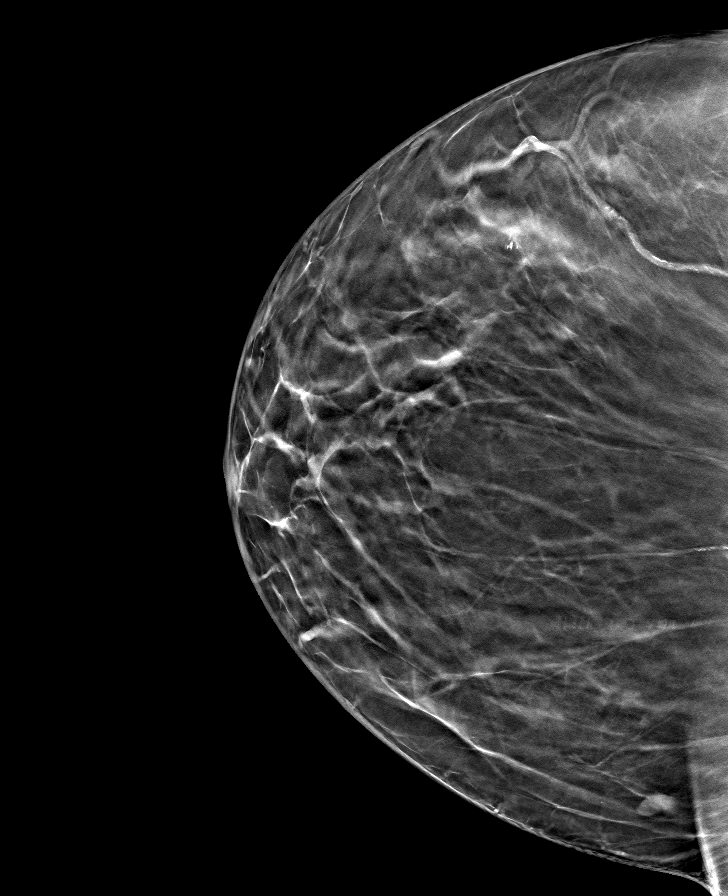

[R MLO tomo · tomo slice 47/92.0]
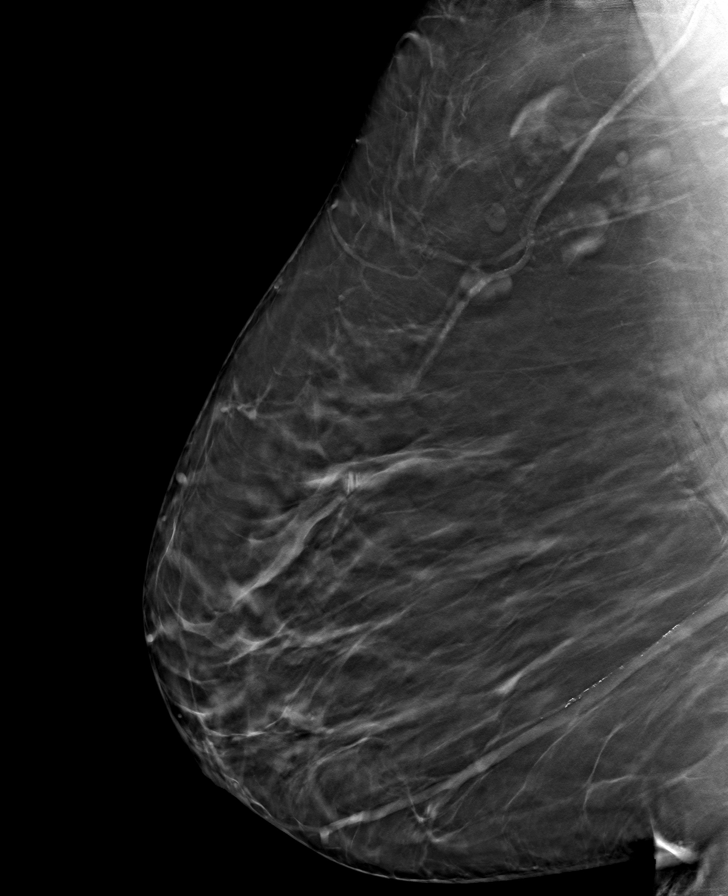

[L MLO tomo · tomo slice 43/86.0]
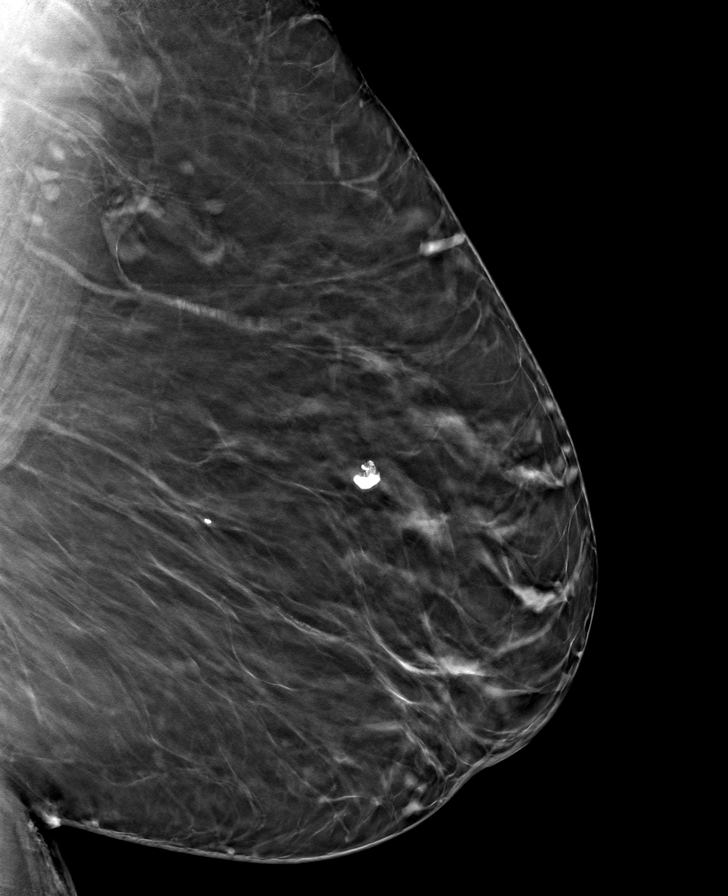

[8 of 24 positions shown; findings below may reference images not displayed]

ACR Breast Density Category c: The breast tissue is heterogeneously
dense, which may obscure small masses.
FINDINGS: There are no findings suspicious for malignancy. Images were
processed with CAD.
IMPRESSION: No mammographic evidence of malignancy. A result letter of this
screening mammogram will be mailed directly to the patient.

RECOMMENDATION:
Screening mammogram in one year. (Code:[5V])

BI-RADS CATEGORY  1: Negative.

## 2018-03-03 ENCOUNTER — Other Ambulatory Visit: Payer: Self-pay | Admitting: Family Medicine

## 2018-03-03 ENCOUNTER — Other Ambulatory Visit: Payer: Self-pay | Admitting: Obstetrics and Gynecology

## 2018-03-03 DIAGNOSIS — M25561 Pain in right knee: Principal | ICD-10-CM

## 2018-03-03 DIAGNOSIS — R52 Pain, unspecified: Secondary | ICD-10-CM

## 2018-03-03 DIAGNOSIS — G8929 Other chronic pain: Secondary | ICD-10-CM

## 2018-03-11 MED FILL — hydrOXYzine HCL 25 MG TABS: 25 | 7 days supply | Qty: 30 | Fill #0

## 2018-03-11 MED FILL — LISINOPRIL-HCTZ 20-25 MG TA: 20-25 | 30 days supply | Qty: 30 | Fill #4

## 2018-03-11 MED FILL — MELOXICAM 15 MG TABLET: 15 | 30 days supply | Qty: 30 | Fill #0

## 2018-03-17 ENCOUNTER — Ambulatory Visit (INDEPENDENT_AMBULATORY_CARE_PROVIDER_SITE_OTHER): Payer: Self-pay | Admitting: Family Medicine

## 2018-03-17 ENCOUNTER — Encounter: Payer: Self-pay | Admitting: Family Medicine

## 2018-03-17 VITALS — BP 132/70 | HR 90 | Temp 97.8°F | Ht 61.0 in | Wt 237.0 lb

## 2018-03-17 DIAGNOSIS — R232 Flushing: Secondary | ICD-10-CM

## 2018-03-17 DIAGNOSIS — G629 Polyneuropathy, unspecified: Secondary | ICD-10-CM

## 2018-03-17 DIAGNOSIS — J4520 Mild intermittent asthma, uncomplicated: Secondary | ICD-10-CM

## 2018-03-17 DIAGNOSIS — R8290 Unspecified abnormal findings in urine: Secondary | ICD-10-CM

## 2018-03-17 DIAGNOSIS — R635 Abnormal weight gain: Secondary | ICD-10-CM

## 2018-03-17 DIAGNOSIS — Z09 Encounter for follow-up examination after completed treatment for conditions other than malignant neoplasm: Secondary | ICD-10-CM

## 2018-03-17 DIAGNOSIS — K5900 Constipation, unspecified: Secondary | ICD-10-CM

## 2018-03-17 DIAGNOSIS — M25562 Pain in left knee: Secondary | ICD-10-CM

## 2018-03-17 DIAGNOSIS — G8929 Other chronic pain: Secondary | ICD-10-CM

## 2018-03-17 DIAGNOSIS — R319 Hematuria, unspecified: Secondary | ICD-10-CM

## 2018-03-17 DIAGNOSIS — N390 Urinary tract infection, site not specified: Secondary | ICD-10-CM

## 2018-03-17 DIAGNOSIS — M25561 Pain in right knee: Secondary | ICD-10-CM

## 2018-03-17 DIAGNOSIS — I10 Essential (primary) hypertension: Secondary | ICD-10-CM

## 2018-03-17 DIAGNOSIS — M25511 Pain in right shoulder: Secondary | ICD-10-CM

## 2018-03-17 LAB — POCT URINALYSIS DIP (MANUAL ENTRY)
Glucose, UA: NEGATIVE mg/dL
Ketones, POC UA: NEGATIVE mg/dL
Nitrite, UA: NEGATIVE
Protein Ur, POC: NEGATIVE mg/dL
Spec Grav, UA: 1.025 (ref 1.010–1.025)
Urobilinogen, UA: 0.2 E.U./dL
pH, UA: 5 (ref 5.0–8.0)

## 2018-03-17 MED ORDER — SULFAMETHOXAZOLE-TRIMETHOPRIM 800-160 MG PO TABS: 1.0000 | ORAL_TABLET | Freq: Two times a day (BID) | ORAL | 0 refills | Status: DC

## 2018-03-17 MED ORDER — CETIRIZINE HCL 10 MG PO TABS: 10.0000 mg | ORAL_TABLET | Freq: Every day | ORAL | 2 refills | Status: DC

## 2018-03-17 MED ORDER — KETOROLAC TROMETHAMINE 60 MG/2ML IM SOLN
60.0000 mg | Freq: Once | INTRAMUSCULAR | Status: AC
  Administered 2018-03-17: 60 mg via INTRAMUSCULAR

## 2018-03-17 MED ORDER — GABAPENTIN 100 MG PO CAPS: 100.0000 mg | ORAL_CAPSULE | Freq: Three times a day (TID) | ORAL | 2 refills | Status: DC

## 2018-03-17 MED FILL — SULFAMETHOXAZOLE-TMP DS TAB: 800-160 | 7 days supply | Qty: 14 | Fill #0

## 2018-03-17 NOTE — Patient Instructions (Signed)
DASH Eating Plan DASH stands for "Dietary Approaches to Stop Hypertension." The DASH eating plan is a healthy eating plan that has been shown to reduce high blood pressure (hypertension). It may also reduce your risk for type 2 diabetes, heart disease, and stroke. The DASH eating plan may also help with weight loss. What are tips for following this plan? General guidelines  Avoid eating more than 2,300 mg (milligrams) of salt (sodium) a day. If you have hypertension, you may need to reduce your sodium intake to 1,500 mg a day.  Limit alcohol intake to no more than 1 drink a day for nonpregnant women and 2 drinks a day for men. One drink equals 12 oz of beer, 5 oz of wine, or 1 oz of hard liquor.  Work with your health care provider to maintain a healthy body weight or to lose weight. Ask what an ideal weight is for you.  Get at least 30 minutes of exercise that causes your heart to beat faster (aerobic exercise) most days of the week. Activities may include walking, swimming, or biking.  Work with your health care provider or diet and nutrition specialist (dietitian) to adjust your eating plan to your individual calorie needs. Reading food labels  Check food labels for the amount of sodium per serving. Choose foods with less than 5 percent of the Daily Value of sodium. Generally, foods with less than 300 mg of sodium per serving fit into this eating plan.  To find whole grains, look for the word "whole" as the first word in the ingredient list. Shopping  Buy products labeled as "low-sodium" or "no salt added."  Buy fresh foods. Avoid canned foods and premade or frozen meals. Cooking  Avoid adding salt when cooking. Use salt-free seasonings or herbs instead of table salt or sea salt. Check with your health care provider or pharmacist before using salt substitutes.  Do not fry foods. Cook foods using healthy methods such as baking, boiling, grilling, and broiling instead.  Cook with  heart-healthy oils, such as olive, canola, soybean, or sunflower oil. Meal planning   Eat a balanced diet that includes: ? 5 or more servings of fruits and vegetables each day. At each meal, try to fill half of your plate with fruits and vegetables. ? Up to 6-8 servings of whole grains each day. ? Less than 6 oz of lean meat, poultry, or fish each day. A 3-oz serving of meat is about the same size as a deck of cards. One egg equals 1 oz. ? 2 servings of low-fat dairy each day. ? A serving of nuts, seeds, or beans 5 times each week. ? Heart-healthy fats. Healthy fats called Omega-3 fatty acids are found in foods such as flaxseeds and coldwater fish, like sardines, salmon, and mackerel.  Limit how much you eat of the following: ? Canned or prepackaged foods. ? Food that is high in trans fat, such as fried foods. ? Food that is high in saturated fat, such as fatty meat. ? Sweets, desserts, sugary drinks, and other foods with added sugar. ? Full-fat dairy products.  Do not salt foods before eating.  Try to eat at least 2 vegetarian meals each week.  Eat more home-cooked food and less restaurant, buffet, and fast food.  When eating at a restaurant, ask that your food be prepared with less salt or no salt, if possible. What foods are recommended? The items listed may not be a complete list. Talk with your dietitian about what   dietary choices are best for you. Grains Whole-grain or whole-wheat bread. Whole-grain or whole-wheat pasta. Brown rice. Oatmeal. Quinoa. Bulgur. Whole-grain and low-sodium cereals. Pita bread. Low-fat, low-sodium crackers. Whole-wheat flour tortillas. Vegetables Fresh or frozen vegetables (raw, steamed, roasted, or grilled). Low-sodium or reduced-sodium tomato and vegetable juice. Low-sodium or reduced-sodium tomato sauce and tomato paste. Low-sodium or reduced-sodium canned vegetables. Fruits All fresh, dried, or frozen fruit. Canned fruit in natural juice (without  added sugar). Meat and other protein foods Skinless chicken or turkey. Ground chicken or turkey. Pork with fat trimmed off. Fish and seafood. Egg whites. Dried beans, peas, or lentils. Unsalted nuts, nut butters, and seeds. Unsalted canned beans. Lean cuts of beef with fat trimmed off. Low-sodium, lean deli meat. Dairy Low-fat (1%) or fat-free (skim) milk. Fat-free, low-fat, or reduced-fat cheeses. Nonfat, low-sodium ricotta or cottage cheese. Low-fat or nonfat yogurt. Low-fat, low-sodium cheese. Fats and oils Soft margarine without trans fats. Vegetable oil. Low-fat, reduced-fat, or light mayonnaise and salad dressings (reduced-sodium). Canola, safflower, olive, soybean, and sunflower oils. Avocado. Seasoning and other foods Herbs. Spices. Seasoning mixes without salt. Unsalted popcorn and pretzels. Fat-free sweets. What foods are not recommended? The items listed may not be a complete list. Talk with your dietitian about what dietary choices are best for you. Grains Baked goods made with fat, such as croissants, muffins, or some breads. Dry pasta or rice meal packs. Vegetables Creamed or fried vegetables. Vegetables in a cheese sauce. Regular canned vegetables (not low-sodium or reduced-sodium). Regular canned tomato sauce and paste (not low-sodium or reduced-sodium). Regular tomato and vegetable juice (not low-sodium or reduced-sodium). Pickles. Olives. Fruits Canned fruit in a light or heavy syrup. Fried fruit. Fruit in cream or butter sauce. Meat and other protein foods Fatty cuts of meat. Ribs. Fried meat. Bacon. Sausage. Bologna and other processed lunch meats. Salami. Fatback. Hotdogs. Bratwurst. Salted nuts and seeds. Canned beans with added salt. Canned or smoked fish. Whole eggs or egg yolks. Chicken or turkey with skin. Dairy Whole or 2% milk, cream, and half-and-half. Whole or full-fat cream cheese. Whole-fat or sweetened yogurt. Full-fat cheese. Nondairy creamers. Whipped toppings.  Processed cheese and cheese spreads. Fats and oils Butter. Stick margarine. Lard. Shortening. Ghee. Bacon fat. Tropical oils, such as coconut, palm kernel, or palm oil. Seasoning and other foods Salted popcorn and pretzels. Onion salt, garlic salt, seasoned salt, table salt, and sea salt. Worcestershire sauce. Tartar sauce. Barbecue sauce. Teriyaki sauce. Soy sauce, including reduced-sodium. Steak sauce. Canned and packaged gravies. Fish sauce. Oyster sauce. Cocktail sauce. Horseradish that you find on the shelf. Ketchup. Mustard. Meat flavorings and tenderizers. Bouillon cubes. Hot sauce and Tabasco sauce. Premade or packaged marinades. Premade or packaged taco seasonings. Relishes. Regular salad dressings. Where to find more information:  National Heart, Lung, and Blood Institute: www.nhlbi.nih.gov  American Heart Association: www.heart.org Summary  The DASH eating plan is a healthy eating plan that has been shown to reduce high blood pressure (hypertension). It may also reduce your risk for type 2 diabetes, heart disease, and stroke.  With the DASH eating plan, you should limit salt (sodium) intake to 2,300 mg a day. If you have hypertension, you may need to reduce your sodium intake to 1,500 mg a day.  When on the DASH eating plan, aim to eat more fresh fruits and vegetables, whole grains, lean proteins, low-fat dairy, and heart-healthy fats.  Work with your health care provider or diet and nutrition specialist (dietitian) to adjust your eating plan to your individual   calorie needs. This information is not intended to replace advice given to you by your health care provider. Make sure you discuss any questions you have with your health care provider. Document Released: 07/26/2011 Document Revised: 07/30/2016 Document Reviewed: 07/30/2016 Elsevier Interactive Patient Education  2018 [place] Heart-healthy meal planning includes:  Limiting unhealthy  fats.  Increasing healthy fats.  Making other small dietary changes.  You may need to talk with your doctor or a diet specialist (dietitian) to create an eating plan that is right for you. What types of fat should I choose?  Choose healthy fats. These include olive oil and canola oil, flaxseeds, walnuts, almonds, and seeds.  Eat more omega-3 fats. These include salmon, mackerel, sardines, tuna, flaxseed oil, and ground flaxseeds. Try to eat fish at least twice each week.  Limit saturated fats. ? Saturated fats are often found in animal products, such as meats, butter, and cream. ? Plant sources of saturated fats include palm oil, palm kernel oil, and coconut oil.  Avoid foods with partially hydrogenated oils in them. These include stick margarine, some tub margarines, cookies, crackers, and other baked goods. These contain trans fats. What general guidelines do I need to follow?  Check food labels carefully. Identify foods with trans fats or high amounts of saturated fat.  Fill one half of your plate with vegetables and green salads. Eat 4-5 servings of vegetables per day. A serving of vegetables is: ? 1 cup of raw leafy vegetables. ?  cup of raw or cooked cut-up vegetables. ?  cup of vegetable juice.  Fill one fourth of your plate with whole grains. Look for the word "whole" as the first word in the ingredient list.  Fill one fourth of your plate with lean protein foods.  Eat 4-5 servings of fruit per day. A serving of fruit is: ? One medium whole fruit. ?  cup of dried fruit. ?  cup of fresh, frozen, or canned fruit. ?  cup of 100% fruit juice.  Eat more foods that contain soluble fiber. These include apples, broccoli, carrots, beans, peas, and barley. Try to get 20-30 g of fiber per day.  Eat more home-cooked food. Eat less restaurant, buffet, and fast food.  Limit or avoid alcohol.  Limit foods high in starch and sugar.  Avoid fried foods.  Avoid frying your  food. Try baking, boiling, grilling, or broiling it instead. You can also reduce fat by: ? Removing the skin from poultry. ? Removing all visible fats from meats. ? Skimming the fat off of stews, soups, and gravies before serving them. ? Steaming vegetables in water or broth.  Lose weight if you are overweight.  Eat 4-5 servings of nuts, legumes, and seeds per week: ? One serving of dried beans or legumes equals  cup after being cooked. ? One serving of nuts equals 1 ounces. ? One serving of seeds equals  ounce or one tablespoon.  You may need to keep track of how much salt or sodium you eat. This is especially true if you have high blood pressure. Talk with your doctor or dietitian to get more information. What foods can I eat? Grains Breads, including [place], white, pita, wheat, raisin, rye, oatmeal, and [place]. Tortillas that are neither fried nor made with lard or trans fat. Low-fat rolls, including hotdog and hamburger buns and English muffins. Biscuits. Muffins. Waffles. Pancakes. Light popcorn. Whole-grain cereals. Flatbread. Melba toast. Pretzels. Breadsticks. Rusks. Low-fat snacks. Low-fat crackers, including oyster, saltine,  matzo, graham, animal, and rye. Rice and pasta, including brown rice and pastas that are made with whole wheat. Vegetables All vegetables. Fruits All fruits, but limit coconut. Meats and Other Protein Sources Lean, well-trimmed beef, veal, pork, and lamb. Chicken and [place] without skin. All fish and shellfish. Wild duck, rabbit, pheasant, and venison. Egg whites or low-cholesterol egg substitutes. Dried beans, peas, lentils, and tofu. Seeds and most nuts. Dairy Low-fat or nonfat cheeses, including ricotta, string, and mozzarella. Skim or 1% milk that is liquid, powdered, or evaporated. Buttermilk that is made with low-fat milk. Nonfat or low-fat yogurt. Beverages Mineral water. Diet carbonated beverages. Sweets and Desserts Sherbets and fruit ices.  Honey, jam, marmalade, jelly, and syrups. Meringues and gelatins. Pure sugar candy, such as hard candy, jelly beans, gumdrops, mints, marshmallows, and small amounts of dark chocolate. [employer] Eat all sweets and desserts in moderation. Fats and Oils Nonhydrogenated (trans-free) margarines. Vegetable oils, including soybean, sesame, sunflower, olive, peanut, safflower, corn, canola, and cottonseed. Salad dressings or mayonnaise made with a vegetable oil. Limit added fats and oils that you use for cooking, baking, salads, and as spreads. Other Cocoa powder. Coffee and tea. All seasonings and condiments. The items listed above may not be a complete list of recommended foods or beverages. Contact your dietitian for more options. What foods are not recommended? Grains Breads that are made with saturated or trans fats, oils, or whole milk. Croissants. Butter rolls. Cheese breads. Sweet rolls. Donuts. Buttered popcorn. Chow mein noodles. High-fat crackers, such as cheese or butter crackers. Meats and Other Protein Sources Fatty meats, such as hotdogs, short ribs, sausage, spareribs, bacon, rib eye roast or steak, and mutton. High-fat deli meats, such as salami and bologna. Caviar. Domestic duck and goose. Organ meats, such as kidney, liver, sweetbreads, and heart. Dairy Cream, sour cream, cream cheese, and creamed cottage cheese. Whole-milk cheeses, including blue (bleu), Monte[place] Jack, [place], [place], [place], [place], Swiss, cheddar, [place], and [place]. Whole or 2% milk that is liquid, evaporated, or condensed. Whole buttermilk. Cream sauce or high-fat cheese sauce. Yogurt that is made from whole milk. Beverages Regular sodas and juice drinks with added sugar. Sweets and Desserts Frosting. Pudding. Cookies. Cakes other than angel food cake. Candy that has milk chocolate or white chocolate, hydrogenated fat, butter, coconut, or unknown ingredients. Buttered syrups. Full-fat ice cream or ice  cream drinks. Fats and Oils Gravy that has suet, meat fat, or shortening. Cocoa butter, hydrogenated oils, palm oil, coconut oil, palm kernel oil. These can often be found in baked products, candy, fried foods, nondairy creamers, and whipped toppings. Solid fats and shortenings, including bacon fat, salt pork, lard, and butter. Nondairy cream substitutes, such as coffee creamers and sour cream substitutes. Salad dressings that are made of unknown oils, cheese, or sour cream. The items listed above may not be a complete list of foods and beverages to avoid. Contact your dietitian for more information. This information is not intended to replace advice given to you by your health care provider. Make sure you discuss any questions you have with your health care provider. Document Released: 02/05/2012 Document Revised: 01/12/2016 Document Reviewed: 01/28/2014 Elsevier Interactive Patient Education  2018 [employer]. Gabapentin capsules or tablets What is this medicine? GABAPENTIN (GA ba pen tin) is used to control partial seizures in adults with epilepsy. It is also used to treat certain types of nerve pain. This medicine may be used for other purposes; ask your health care provider or pharmacist if you  have questions. COMMON BRAND NAME(S): Active-PAC with Gabapentin, Gabarone, Neurontin What should I tell my health care provider before I take this medicine? They need to know if you have any of these conditions: -kidney disease -suicidal thoughts, plans, or attempt; a previous suicide attempt by you or a family member -an unusual or allergic reaction to gabapentin, other medicines, foods, dyes, or preservatives -pregnant or trying to get pregnant -breast-feeding How should I use this medicine? Take this medicine by mouth with a glass of water. Follow the directions on the prescription label. You can take it with or without food. If it upsets your stomach, take it with food.Take your medicine at  regular intervals. Do not take it more often than directed. Do not stop taking except on your doctor's advice. If you are directed to break the 600 or 800 mg tablets in half as part of your dose, the extra half tablet should be used for the next dose. If you have not used the extra half tablet within 28 days, it should be thrown away. A special MedGuide will be given to you by the pharmacist with each prescription and refill. Be sure to read this information carefully each time. Talk to your pediatrician regarding the use of this medicine in children. Special care may be needed. Overdosage: If you think you have taken too much of this medicine contact a poison control center or emergency room at once. NOTE: This medicine is only for you. Do not share this medicine with others. What if I miss a dose? If you miss a dose, take it as soon as you can. If it is almost time for your next dose, take only that dose. Do not take double or extra doses. What may interact with this medicine? Do not take this medicine with any of the following medications: -other gabapentin products This medicine may also interact with the following medications: -alcohol -antacids -antihistamines for allergy, cough and cold -certain medicines for anxiety or sleep -certain medicines for depression or psychotic disturbances -homatropine; hydrocodone -naproxen -narcotic medicines (opiates) for pain -phenothiazines like chlorpromazine, mesoridazine, prochlorperazine, thioridazine This list may not describe all possible interactions. Give your health care provider a list of all the medicines, herbs, non-prescription drugs, or dietary supplements you use. Also tell them if you smoke, drink alcohol, or use illegal drugs. Some items may interact with your medicine. What should I watch for while using this medicine? Visit your doctor or health care professional for regular checks on your progress. You may want to keep a record at  home of how you feel your condition is responding to treatment. You may want to share this information with your doctor or health care professional at each visit. You should contact your doctor or health care professional if your seizures get worse or if you have any new types of seizures. Do not stop taking this medicine or any of your seizure medicines unless instructed by your doctor or health care professional. Stopping your medicine suddenly can increase your seizures or their severity. Wear a medical identification bracelet or chain if you are taking this medicine for seizures, and carry a card that lists all your medications. You may get drowsy, dizzy, or have blurred vision. Do not drive, use machinery, or do anything that needs mental alertness until you know how this medicine affects you. To reduce dizzy or fainting spells, do not sit or stand up quickly, especially if you are an older patient. Alcohol can increase drowsiness and dizziness.  Avoid alcoholic drinks. Your mouth may get dry. Chewing sugarless gum or sucking hard candy, and drinking plenty of water will help. The use of this medicine may increase the chance of suicidal thoughts or actions. Pay special attention to how you are responding while on this medicine. Any worsening of mood, or thoughts of suicide or dying should be reported to your health care professional right away. Women who become pregnant while using this medicine may enroll in the [place] Pregnancy Registry by calling [phone]. This registry collects information about the safety of antiepileptic drug use during pregnancy. What side effects may I notice from receiving this medicine? Side effects that you should report to your doctor or health care professional as soon as possible: -allergic reactions like skin rash, itching or hives, swelling of the face, lips, or tongue -worsening of mood, thoughts or actions of suicide or dying Side  effects that usually do not require medical attention (report to your doctor or health care professional if they continue or are bothersome): -constipation -difficulty walking or controlling muscle movements -dizziness -nausea -slurred speech -tiredness -tremors -weight gain This list may not describe all possible side effects. Call your doctor for medical advice about side effects. You may report side effects to FDA at 1-800-FDA-1088. Where should I keep my medicine? Keep out of reach of children. This medicine may cause accidental overdose and death if it taken by other adults, children, or pets. Mix any unused medicine with a substance like cat litter or coffee grounds. Then throw the medicine away in a sealed container like a sealed bag or a coffee can with a lid. Do not use the medicine after the expiration date. Store at room temperature between 15 and 30 degrees C (59 and 86 degrees F). NOTE: This sheet is a summary. It may not cover all possible information. If you have questions about this medicine, talk to your doctor, pharmacist, or health care provider.  2018 Elsevier/Gold Standard (2013-10-02 15:26:50) Sulfamethoxazole; Trimethoprim, SMX-TMP tablets What is this medicine? SULFAMETHOXAZOLE; TRIMETHOPRIM or SMX-TMP (suhl fuh meth OK suh zohl; trye METH oh prim) is a combination of a sulfonamide antibiotic and a second antibiotic, trimethoprim. It is used to treat or prevent certain kinds of bacterial infections. It will not work for colds, flu, or other viral infections. This medicine may be used for other purposes; ask your health care provider or pharmacist if you have questions. COMMON BRAND NAME(S): Bacter-Aid DS, Bactrim, Bactrim DS, Septra, Septra DS What should I tell my health care provider before I take this medicine? They need to know if you have any of these conditions: -anemia -asthma -being treated with anticonvulsants -if you frequently drink alcohol containing  drinks -kidney disease -liver disease -low level of folic acid or [id number] dehydrogenase -poor nutrition or malabsorption -porphyria -severe allergies -thyroid disorder -an unusual or allergic reaction to sulfamethoxazole, trimethoprim, sulfa drugs, other medicines, foods, dyes, or preservatives -pregnant or trying to get pregnant -breast-feeding How should I use this medicine? Take this medicine by mouth with a full glass of water. Follow the directions on the prescription label. Take your medicine at regular intervals. Do not take it more often than directed. Do not skip doses or stop your medicine early. Talk to your pediatrician regarding the use of this medicine in children. Special care may be needed. This medicine has been used in children as young as 14 months of age. Overdosage: If you think you have taken too much of this medicine  contact a poison control center or emergency room at once. NOTE: This medicine is only for you. Do not share this medicine with others. What if I miss a dose? If you miss a dose, take it as soon as you can. If it is almost time for your next dose, take only that dose. Do not take double or extra doses. What may interact with this medicine? Do not take this medicine with any of the following medications: -aminobenzoate potassium -dofetilide -metronidazole This medicine may also interact with the following medications: -ACE inhibitors like benazepril, enalapril, lisinopril, and ramipril -birth control pills -cyclosporine -digoxin -diuretics -indomethacin -medicines for diabetes -methenamine -methotrexate -phenytoin -potassium supplements -pyrimethamine -sulfinpyrazone -tricyclic antidepressants -warfarin This list may not describe all possible interactions. Give your health care provider a list of all the medicines, herbs, non-prescription drugs, or dietary supplements you use. Also tell them if you smoke, drink alcohol, or use  illegal drugs. Some items may interact with your medicine. What should I watch for while using this medicine? Tell your doctor or health care professional if your symptoms do not improve. Drink several glasses of water a day to reduce the risk of kidney problems. Do not treat diarrhea with over the counter products. Contact your doctor if you have diarrhea that lasts more than 2 days or if it is severe and watery. This medicine can make you more sensitive to the sun. Keep out of the sun. If you cannot avoid being in the sun, wear protective clothing and use a sunscreen. Do not use sun lamps or tanning beds/booths. What side effects may I notice from receiving this medicine? Side effects that you should report to your doctor or health care professional as soon as possible: -allergic reactions like skin rash or hives, swelling of the face, lips, or tongue -breathing problems -fever or chills, sore throat -irregular heartbeat, chest pain -joint or muscle pain -pain or difficulty passing urine -red pinpoint spots on skin -redness, blistering, peeling or loosening of the skin, including inside the mouth -unusual bleeding or bruising -unusually weak or tired -yellowing of the eyes or skin Side effects that usually do not require medical attention (report to your doctor or health care professional if they continue or are bothersome): -diarrhea -dizziness -headache -loss of appetite -nausea, vomiting -nervousness This list may not describe all possible side effects. Call your doctor for medical advice about side effects. You may report side effects to FDA at 1-800-FDA-1088. Where should I keep my medicine? Keep out of the reach of children. Store at room temperature between 20 to 25 degrees C (68 to 77 degrees F). Protect from light. Throw away any unused medicine after the expiration date. NOTE: This sheet is a summary. It may not cover all possible information. If you have questions about this  medicine, talk to your doctor, pharmacist, or health care provider.  2018 Elsevier/Gold Standard (2013-03-13 14:38:26)

## 2018-03-17 NOTE — Progress Notes (Signed)
Follow Up  Subjective:    Patient ID: Caroline Ryan, female    DOB: 12/03/1965, 52 y.o.   MRN: [id number]  Chief Complaint  Patient presents with  . Follow-up    chronic conditions   HPI  Caroline Ryan has a past medical history of Seasonal Allergies, Bilateral Chronic Knee Pain, Hypertension, Headache, Depression, Asthma, and Anemia.   Current Status: Since her last office visit, she has complaints of increased knee pain. She has recently began to have right shoulder pain.  She has had increased weight gain over the past year, and reports that she has been attempting to walk and exercise, but her chronic knee pain prevents this.   She denies fevers, chills, fatigue, recent infections, weight loss, and night sweats. She has not had any headaches, visual changes, dizziness, and falls. No chest pain, heart palpitations, cough and shortness of breath reported. No reports of GI problems such as nausea, vomiting, diarrhea, and constipation. She has no reports of blood in stools, dysuria and hematuria. She denies depression, but has mild anxiety.   Past Medical History:  Diagnosis Date  . Anemia   . Asthma   . Depression    no meds  . Fibroid   . Headache(784.0)    otc prn med  . History of blood transfusion   . Hypertension   . Knee pain, bilateral    arthralgia knee pain bilateral  . Seasonal allergies   . SVD (spontaneous vaginal delivery)    x 3    Family History  Problem Relation Age of Onset  . Hypertension Father   . Diabetes Brother   . Diabetes Brother   . Breast cancer Mother 96  . Other Neg Hx     Social History   Socioeconomic History  . Marital status: Single    Spouse name: Not on file  . Number of children: Not on file  . Years of education: Not on file  . Highest education level: Not on file  Occupational History  . Not on file  Social Needs  . Financial resource strain: Not on file  . Food insecurity:    Worry: Not on file    Inability: Not on file   . Transportation needs:    Medical: Not on file    Non-medical: Not on file  Tobacco Use  . Smoking status: Former Smoker    Packs/day: 0.25    Years: 2.00    Pack years: 0.50    Types: Cigarettes    Last attempt to quit: 08/20/2014    Years since quitting: 3.5  . Smokeless tobacco: Never Used  Substance and Sexual Activity  . Alcohol use: Yes    Alcohol/week: 1.2 oz    Types: 2 Glasses of wine per week  . Drug use: No  . Sexual activity: Yes    Birth control/protection: Condom  Lifestyle  . Physical activity:    Days per week: Not on file    Minutes per session: Not on file  . Stress: Not on file  Relationships  . Social connections:    Talks on phone: Not on file    Gets together: Not on file    Attends religious service: Not on file    Active member of club or organization: Not on file    Attends meetings of clubs or organizations: Not on file    Relationship status: Not on file  . Intimate partner violence:    Fear of current or ex partner: Not  on file    Emotionally abused: Not on file    Physically abused: Not on file    Forced sexual activity: Not on file  Other Topics Concern  . Not on file  Social History Narrative  . Not on file   Past Surgical History:  Procedure Laterality Date  . BILATERAL SALPINGECTOMY Bilateral 10/10/2017   Procedure: BILATERAL SALPINGECTOMY;  Surgeon: Chancy Milroy, MD;  Location: [facility];  Service: Gynecology;  Laterality: Bilateral;  . BREAST BIOPSY     US guided core 2009  . BREAST EXCISIONAL BIOPSY     right 1982 left 1981  . BREAST SURGERY     bilateral cysts/benign  . VAGINAL HYSTERECTOMY N/A 10/10/2017   Procedure: HYSTERECTOMY VAGINAL;  Surgeon: Chancy Milroy, MD;  Location: [facility];  Service: Gynecology;  Laterality: N/A;   Immunization History  Administered Date(s) Administered  . Influenza Whole 06/30/2009  . Influenza,inj,Quad PF,6+ Mos 06/27/2015, 07/04/2016  . Pneumococcal Polysaccharide-23 04/14/2008,  06/27/2015  . Td 10/18/2005  . Tdap 07/04/2016   Current Meds  Medication Sig  . albuterol (PROVENTIL HFA;VENTOLIN HFA) 108 (90 Base) MCG/ACT inhaler Inhale 2 puffs into the lungs every 6 (six) hours as needed for wheezing or shortness of breath. Asthma attacks  . cetirizine (ZYRTEC ALLERGY) 10 MG tablet Take 1 tablet (10 mg total) by mouth daily.  . diphenhydrAMINE (BENADRYL) 25 MG tablet Take 1 tablet (25 mg total) by mouth every 6 (six) hours as needed for itching.  . docusate sodium (COLACE) 100 MG capsule Take 1 capsule (100 mg total) by mouth 2 (two) times daily as needed.  . famotidine (PEPCID) 20 MG tablet Take 1 tablet (20 mg total) by mouth 2 (two) times daily as needed (itching).  . hydrOXYzine (ATARAX/VISTARIL) 25 MG tablet TAKE 1 TABLET BY MOUTH EVERY 6 HOURS AS NEEDED FOR ITCHING.  Marland Kitchen ibuprofen (ADVIL,MOTRIN) 800 MG tablet TAKE 1 TABLET BY MOUTH 3 TIMES DAILY.  Marland Kitchen lisinopril-hydrochlorothiazide (PRINZIDE,ZESTORETIC) 20-25 MG tablet TAKE 1 TABLET BY MOUTH DAILY.  . meloxicam (MOBIC) 15 MG tablet Take 1 tablet (15 mg total) by mouth daily.  . meloxicam (MOBIC) 15 MG tablet TAKE 1 TABLET BY MOUTH DAILY.  . montelukast (SINGULAIR) 10 MG tablet TAKE 1 TABLET (10 MG TOTAL) BY MOUTH AT BEDTIME.  . [DISCONTINUED] cetirizine (ZYRTEC ALLERGY) 10 MG tablet Take 1 tablet (10 mg total) by mouth daily as needed (itching).   No Known Allergies  BP 132/70 (BP Location: Right Arm, Patient Position: Sitting, Cuff Size: Large)   Pulse 90   Temp 97.8 F (36.6 C) (Oral)   Ht 5\' 1"  (1.549 m)   Wt 237 lb (107.5 kg)   LMP 04/03/2017 (Approximate)   SpO2 99%   BMI 44.78 kg/m   Review of Systems  Constitutional: Negative.   HENT: Negative.   Eyes: Negative.   Respiratory: Negative.   Cardiovascular: Negative.   Gastrointestinal: Positive for abdominal distention (Obese) and constipation.  Endocrine: Negative.   Genitourinary: Negative.   Musculoskeletal: Positive for arthralgias (rigth  shoulder pain/chronic bilateral knee pain) and joint swelling (bilateral knees).  Skin: Negative.   Allergic/Immunologic: Negative.   Neurological: Negative.   Hematological: Negative.   Psychiatric/Behavioral: Negative.    Objective:   Physical Exam  Constitutional: She is oriented to person, place, and time. She appears well-developed and well-nourished.  HENT:  Head: Normocephalic and atraumatic.  Right Ear: External ear normal.  Left Ear: External ear normal.  Nose: Nose normal.  Mouth/Throat: Oropharynx is  clear and moist.  Eyes: Pupils are equal, round, and reactive to light. Conjunctivae and EOM are normal.  Neck: Normal range of motion. Neck supple.  Cardiovascular: Normal rate, regular rhythm, normal heart sounds and intact distal pulses.  Pulmonary/Chest: Effort normal and breath sounds normal.  Abdominal: Soft. Bowel sounds are normal. She exhibits distension (Obese).  Musculoskeletal:  Limited ROM in knees.   Neurological: She is alert and oriented to person, place, and time.  Skin: Skin is warm and dry. Capillary refill takes less than 2 seconds.  Psychiatric: She has a normal mood and affect. Her behavior is normal. Judgment and thought content normal.  Nursing note and vitals reviewed.  Assessment & Plan:   1. Essential hypertension Blood pressure is stable at 132/70 today. She will continue Prinzide as prescribed.  She will continue to decrease high sodium intake, excessive alcohol intake, increase potassium intake, smoking cessation, and increase physical activity of at least 30 minutes of cardio activity daily. She will continue to follow Heart Healthy or DASH diet.  - POCT urinalysis dipstick  2. Hot flashes Stable. Not worsening. Continue Gabapentin as prescribed to aid in reducing hot flash symptoms.   3. Bilateral chronic knee pain She will receive Toradol injection in office today. Continue Gabapentin and Mobic as prescribed.   - gabapentin (NEURONTIN)  100 MG capsule; Take 1 capsule (100 mg total) by mouth 3 (three) times daily.  Dispense: 90 capsule; Refill: 2 - ketorolac (TORADOL) injection 60 mg  4. Constipation, unspecified constipation type Stable. She will continue Colace as needed.   5. Right shoulder pain, unspecified chronicity She will resume taking Neurontin for pain management. - gabapentin (NEURONTIN) 100 MG capsule; Take 1 capsule (100 mg total) by mouth 3 (three) times daily.  Dispense: 90 capsule; Refill: 2 - ketorolac (TORADOL) injection 60 mg  6. Neuropathy - gabapentin (NEURONTIN) 100 MG capsule; Take 1 capsule (100 mg total) by mouth 3 (three) times daily.  Dispense: 90 capsule; Refill: 2 - ketorolac (TORADOL) injection 60 mg  7. Mild intermittent asthma, unspecified whether complicated - cetirizine (ZYRTEC ALLERGY) 10 MG tablet; Take 1 tablet (10 mg total) by mouth daily.  Dispense: 30 tablet; Refill: 2  8. Urinary tract infection with hematuria, site unspecified - sulfamethoxazole-trimethoprim (BACTRIM DS,SEPTRA DS) 800-160 MG tablet; Take 1 tablet by mouth 2 (two) times daily.  Dispense: 14 tablet; Refill: 0  9. Abnormal urinalysis - Urine Culture  10. Weight increased She continues to have weight gain. Her chronic knee pain prevents her from exercising regularly and walking. BMI at 44.78. Goal BMI  <25. Encouraged efforts to reduce weight include engaging in physical activity as tolerated with goal of 150 minutes per week. Improve dietary choices and eat a meal regimen consistent with a Mediterranean or DASH diet. Reduce simple carbohydrates. Do not skip meals and eat healthy snacks throughout the day to avoid over-eating at dinner. Set a goal weight loss that is achievable for you.  11. Follow up She will follow up in 3 months.   Meds ordered this encounter  Medications  . gabapentin (NEURONTIN) 100 MG capsule    Sig: Take 1 capsule (100 mg total) by mouth 3 (three) times daily.    Dispense:  90 capsule     Refill:  2  . cetirizine (ZYRTEC ALLERGY) 10 MG tablet    Sig: Take 1 tablet (10 mg total) by mouth daily.    Dispense:  30 tablet    Refill:  2  . ketorolac (  TORADOL) injection 60 mg  . sulfamethoxazole-trimethoprim (BACTRIM DS,SEPTRA DS) 800-160 MG tablet    Sig: Take 1 tablet by mouth 2 (two) times daily.    Dispense:  14 tablet    Refill:  0    Kathe Becton,  MSN, FNP-C [facility] [address] [place], [place] [address] [phone]

## 2018-03-19 LAB — URINE CULTURE

## 2018-04-10 ENCOUNTER — Other Ambulatory Visit: Payer: Self-pay

## 2018-04-10 DIAGNOSIS — I10 Essential (primary) hypertension: Secondary | ICD-10-CM

## 2018-04-10 MED ORDER — LISINOPRIL-HYDROCHLOROTHIAZIDE 20-25 MG PO TABS: 1.0000 | ORAL_TABLET | Freq: Every day | ORAL | 5 refills | Status: DC

## 2018-04-24 MED FILL — ?CETIRIZINE HCL 10 MG TABLE: 10 | 30 days supply | Qty: 30 | Fill #2

## 2018-04-24 MED FILL — IBUPROFEN 800 MG TABLET: 800 | 10 days supply | Qty: 30 | Fill #0

## 2018-04-24 MED FILL — ?LISINOPRIL-HCTZ 20/25 TAB: 20-25 | 30 days supply | Qty: 30 | Fill #5

## 2018-05-09 ENCOUNTER — Ambulatory Visit (INDEPENDENT_AMBULATORY_CARE_PROVIDER_SITE_OTHER): Payer: Self-pay | Admitting: Family Medicine

## 2018-05-09 ENCOUNTER — Ambulatory Visit (HOSPITAL_COMMUNITY)
Admission: RE | Admit: 2018-05-09 | Discharge: 2018-05-09 | Disposition: A | Discharge status: Home / Self Care | Payer: No Typology Code available for payment source | Source: Ambulatory Visit | Attending: Family Medicine | Admitting: Family Medicine

## 2018-05-09 ENCOUNTER — Encounter: Payer: Self-pay | Admitting: Family Medicine

## 2018-05-09 VITALS — BP 163/95 | HR 80 | Temp 97.9°F | Resp 16 | Ht 60.0 in | Wt 245.0 lb

## 2018-05-09 DIAGNOSIS — G8929 Other chronic pain: Secondary | ICD-10-CM | POA: Insufficient documentation

## 2018-05-09 DIAGNOSIS — M25562 Pain in left knee: Secondary | ICD-10-CM

## 2018-05-09 DIAGNOSIS — M25561 Pain in right knee: Secondary | ICD-10-CM | POA: Insufficient documentation

## 2018-05-09 DIAGNOSIS — M170 Bilateral primary osteoarthritis of knee: Secondary | ICD-10-CM | POA: Insufficient documentation

## 2018-05-09 IMAGING — DX DG KNEE 3 VIEWS*L*
3 series · 3 of 3 positions shown · non-contrast
Comparison: None.

CLINICAL DATA: Chronic bilateral knee pain.

EXAM:
LEFT KNEE - 3 VIEW

[knee ap (1 of 2)]
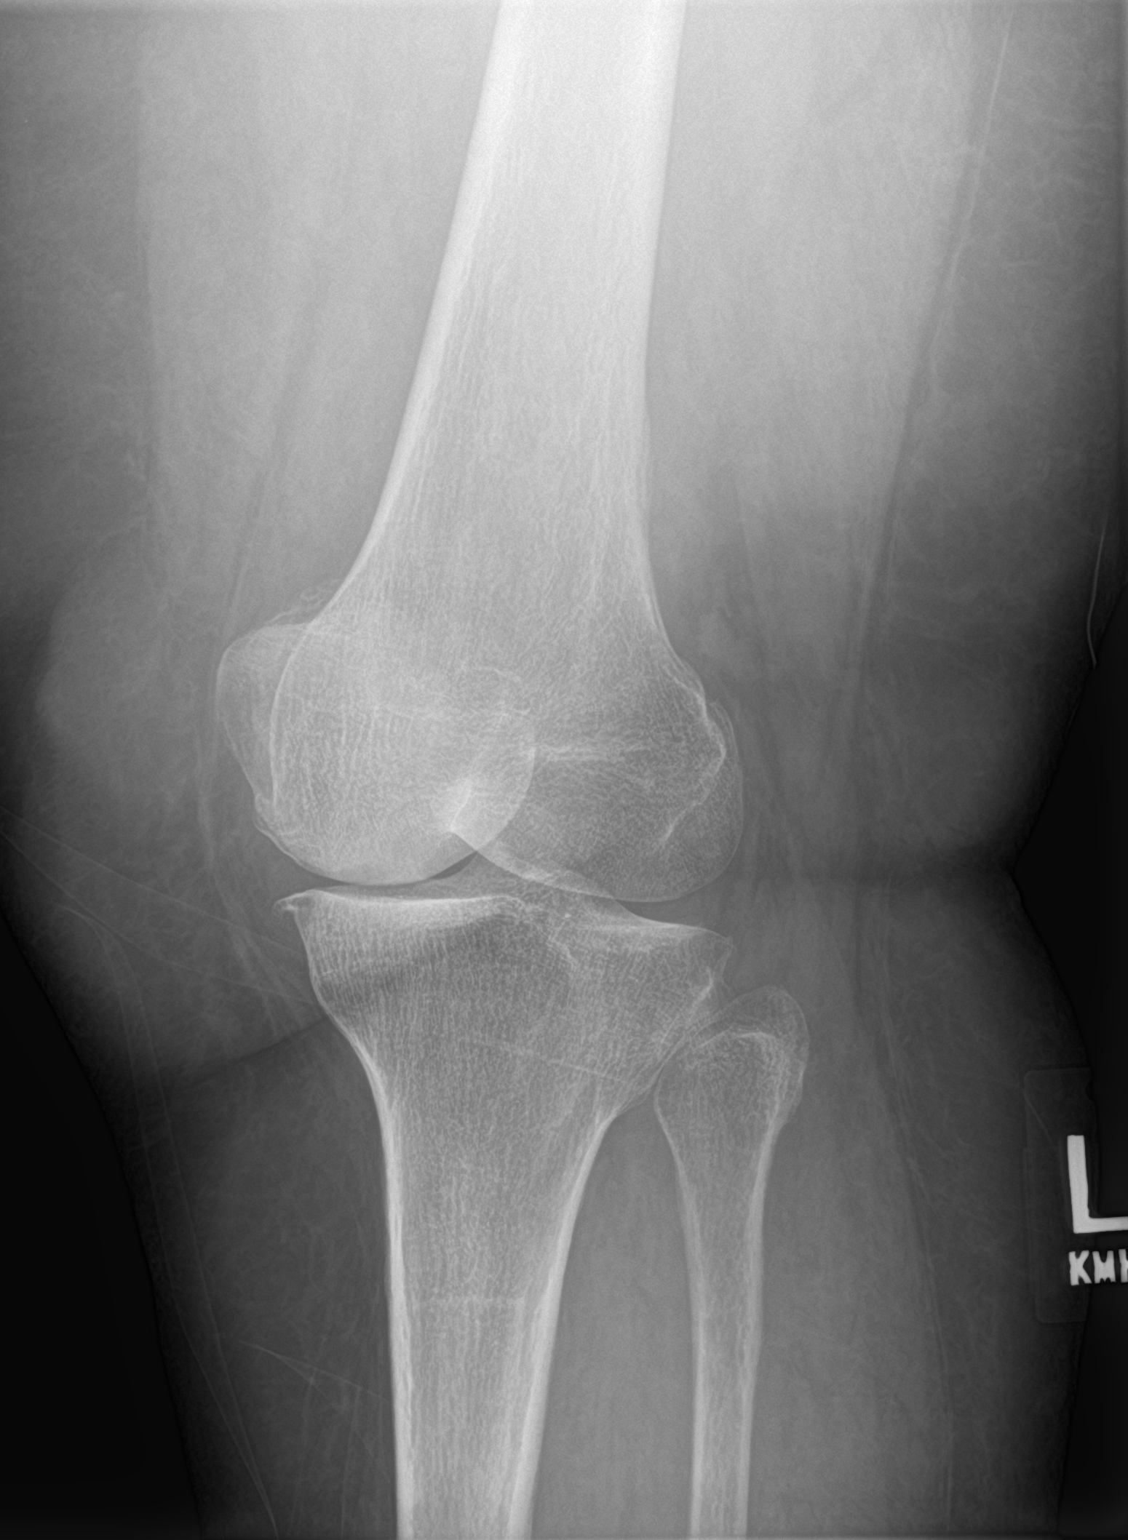

[knee lat]
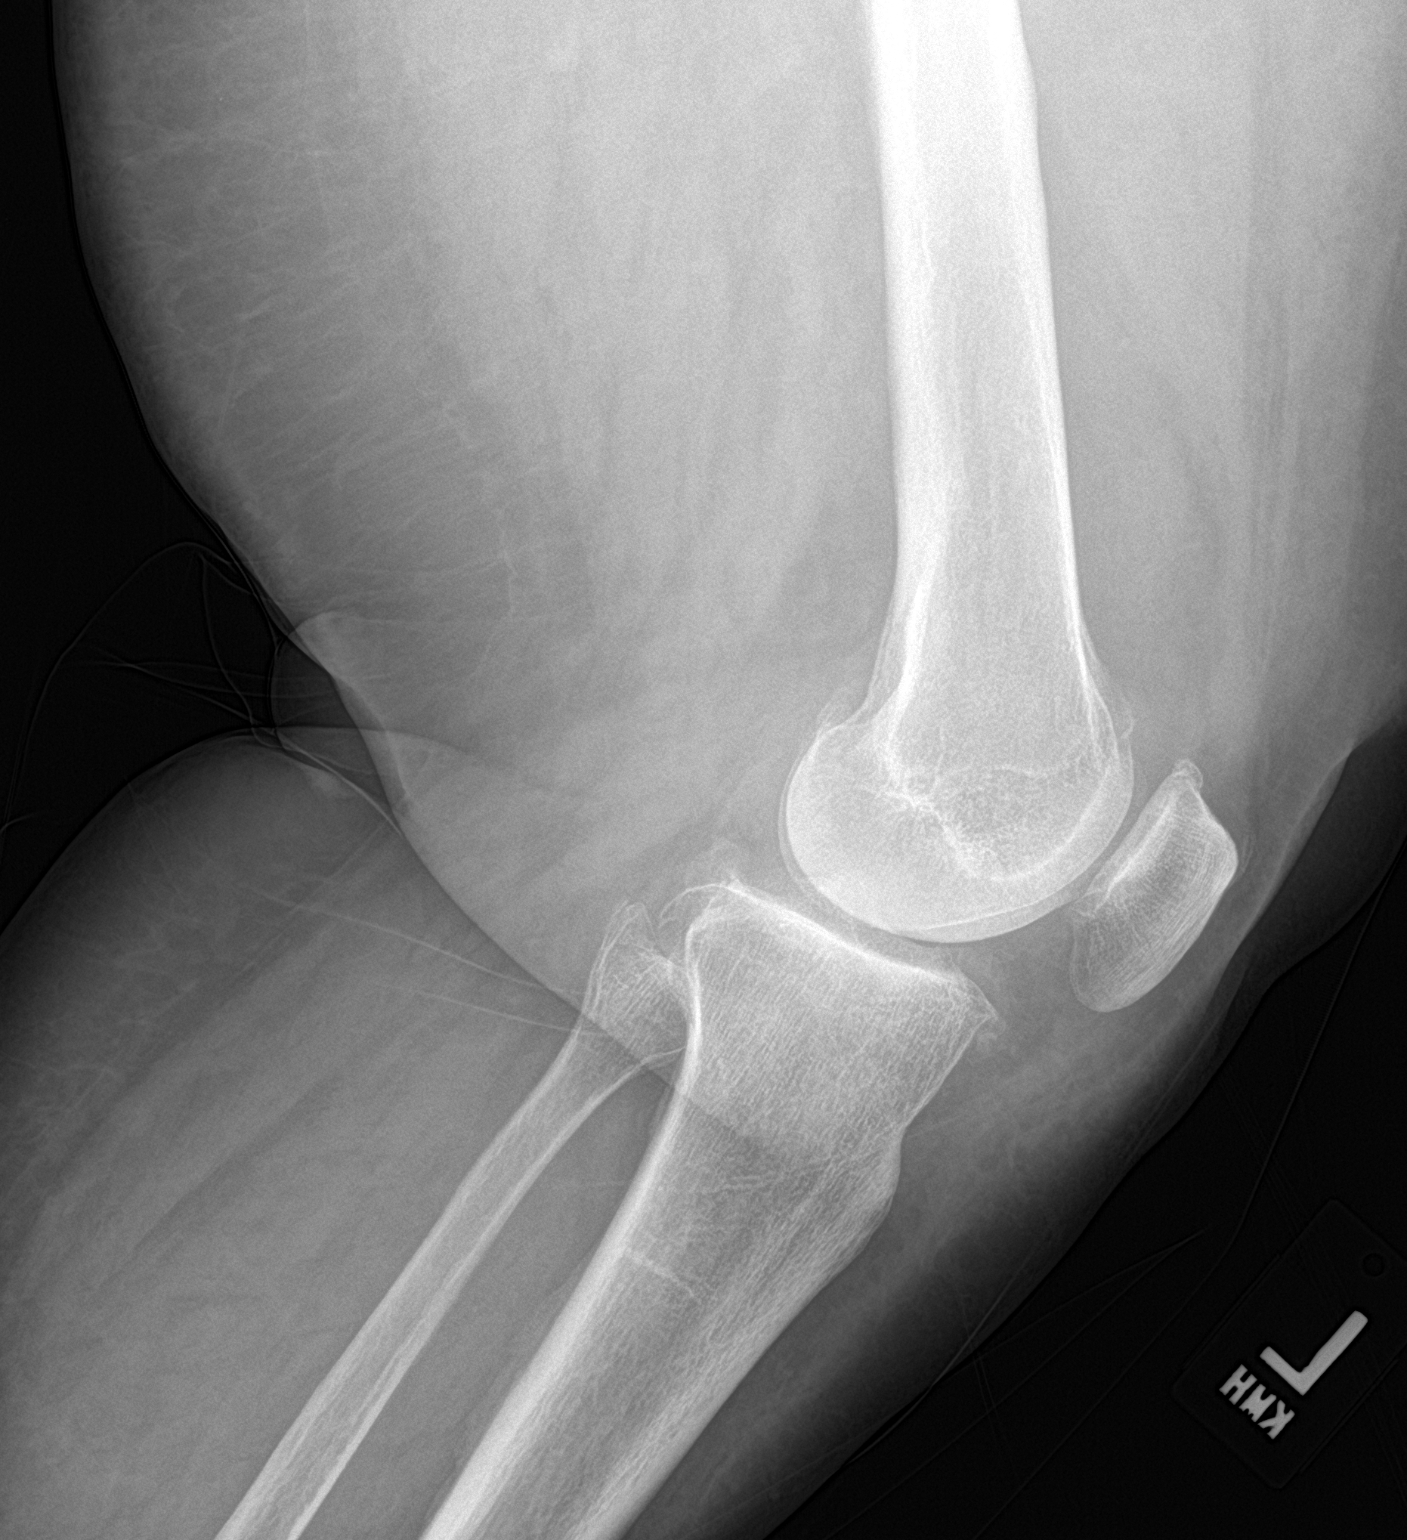

[knee ap (2 of 2)]
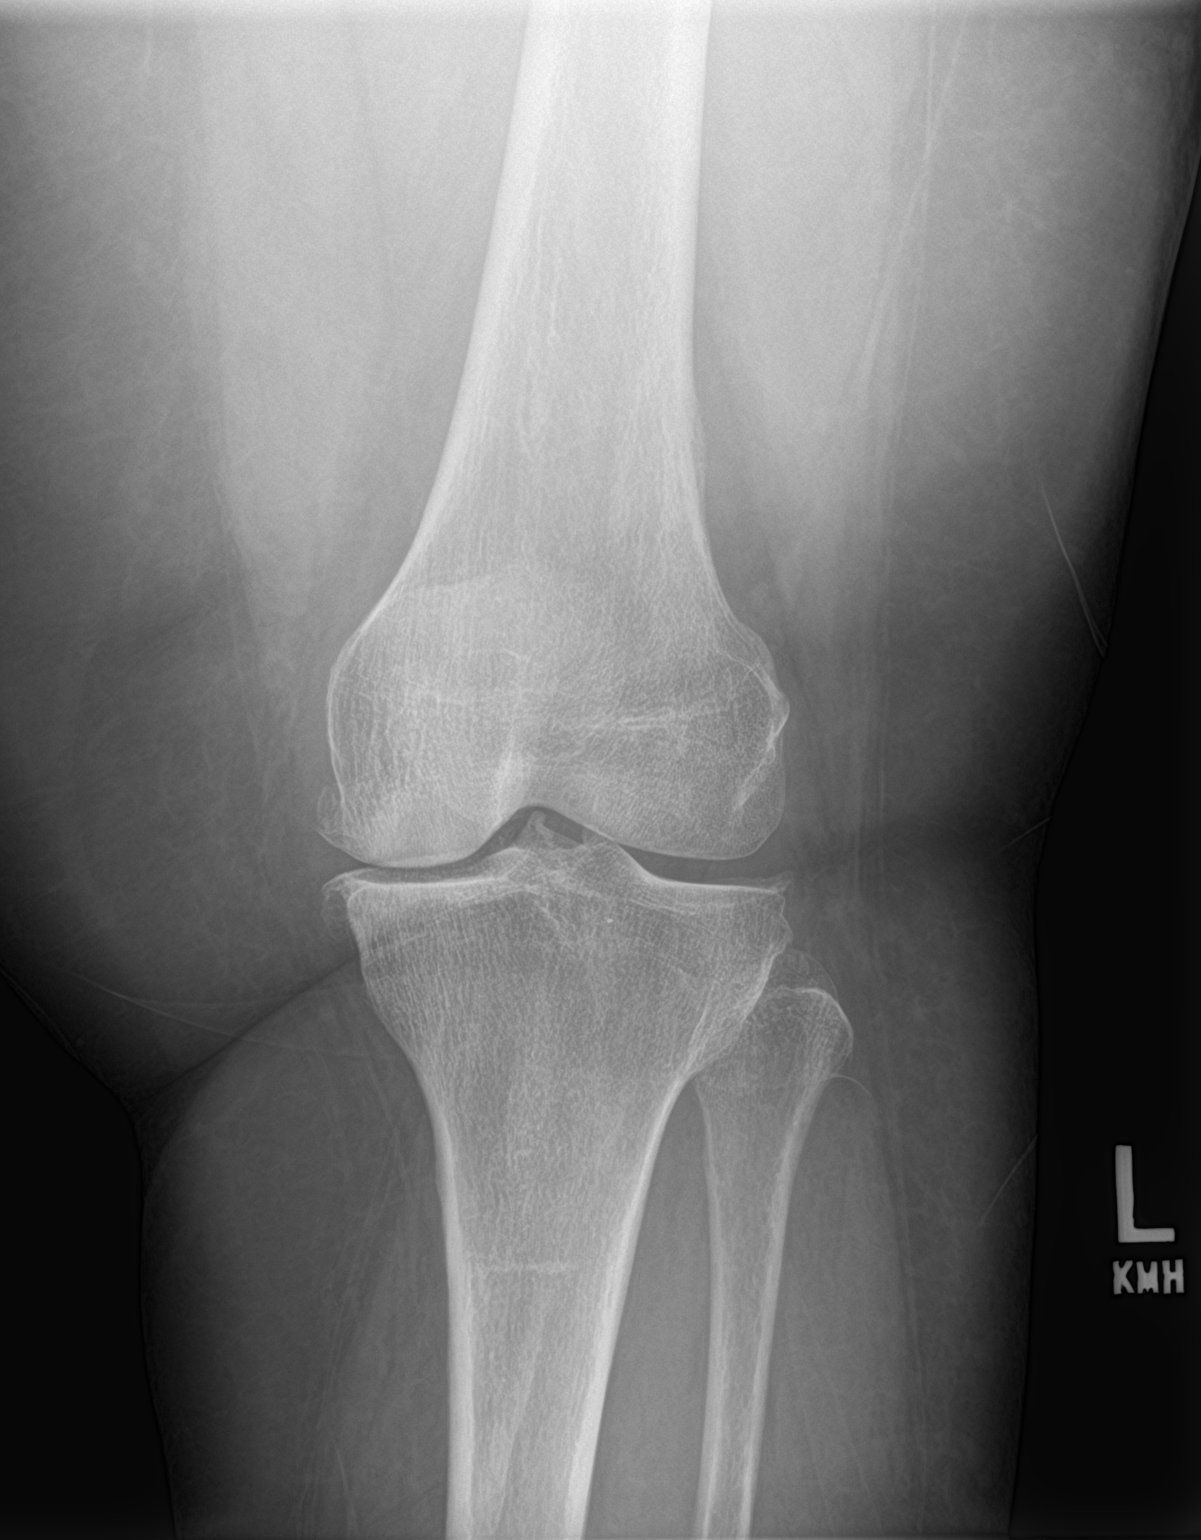

[3 of 3 positions shown; findings below may reference images not displayed]

FINDINGS: There is no fracture or dislocation. There is medial joint space
narrowing with small tricompartmental marginal osteophytes. No
definitive joint effusion.
IMPRESSION: Tricompartmental osteoarthritis, primarily in the compartment.

## 2018-05-09 IMAGING — DX DG KNEE 3 VIEWS*R*
3 series · 3 of 3 positions shown · non-contrast
Comparison: [DATE]

CLINICAL DATA: Chronic knee pain.

EXAM:
RIGHT KNEE - 3 VIEW

[knee ap]
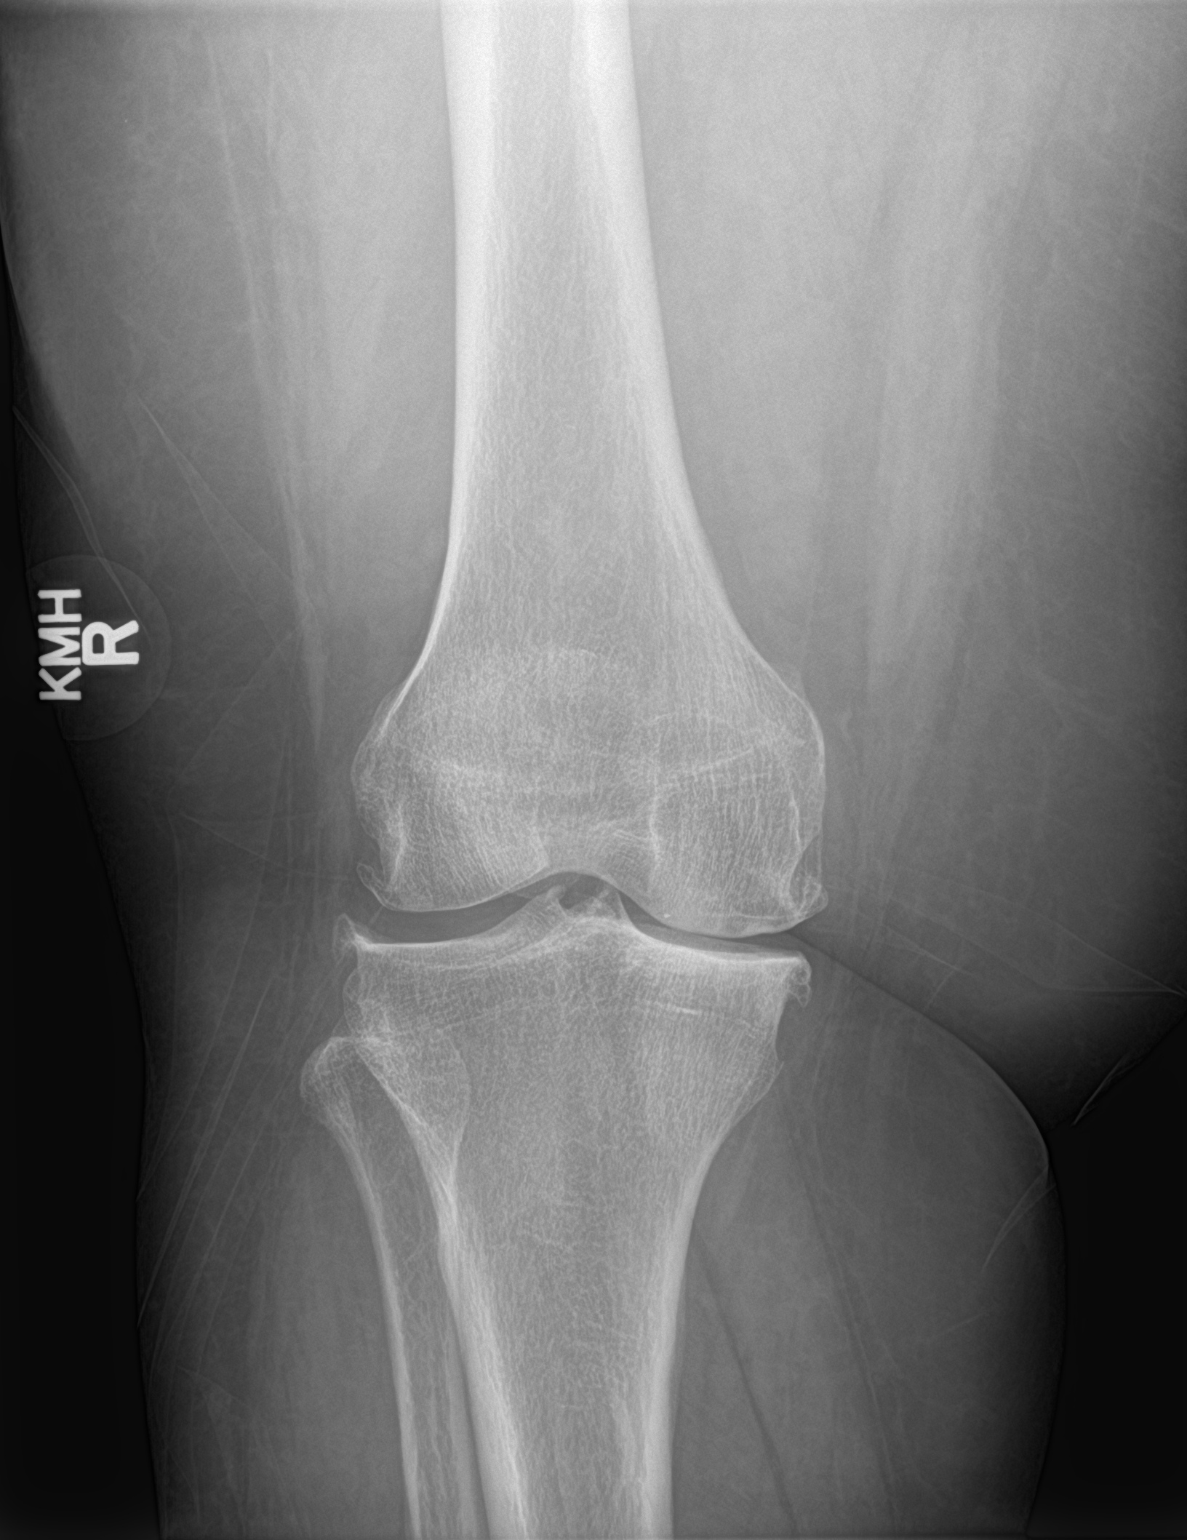

[tunnel]
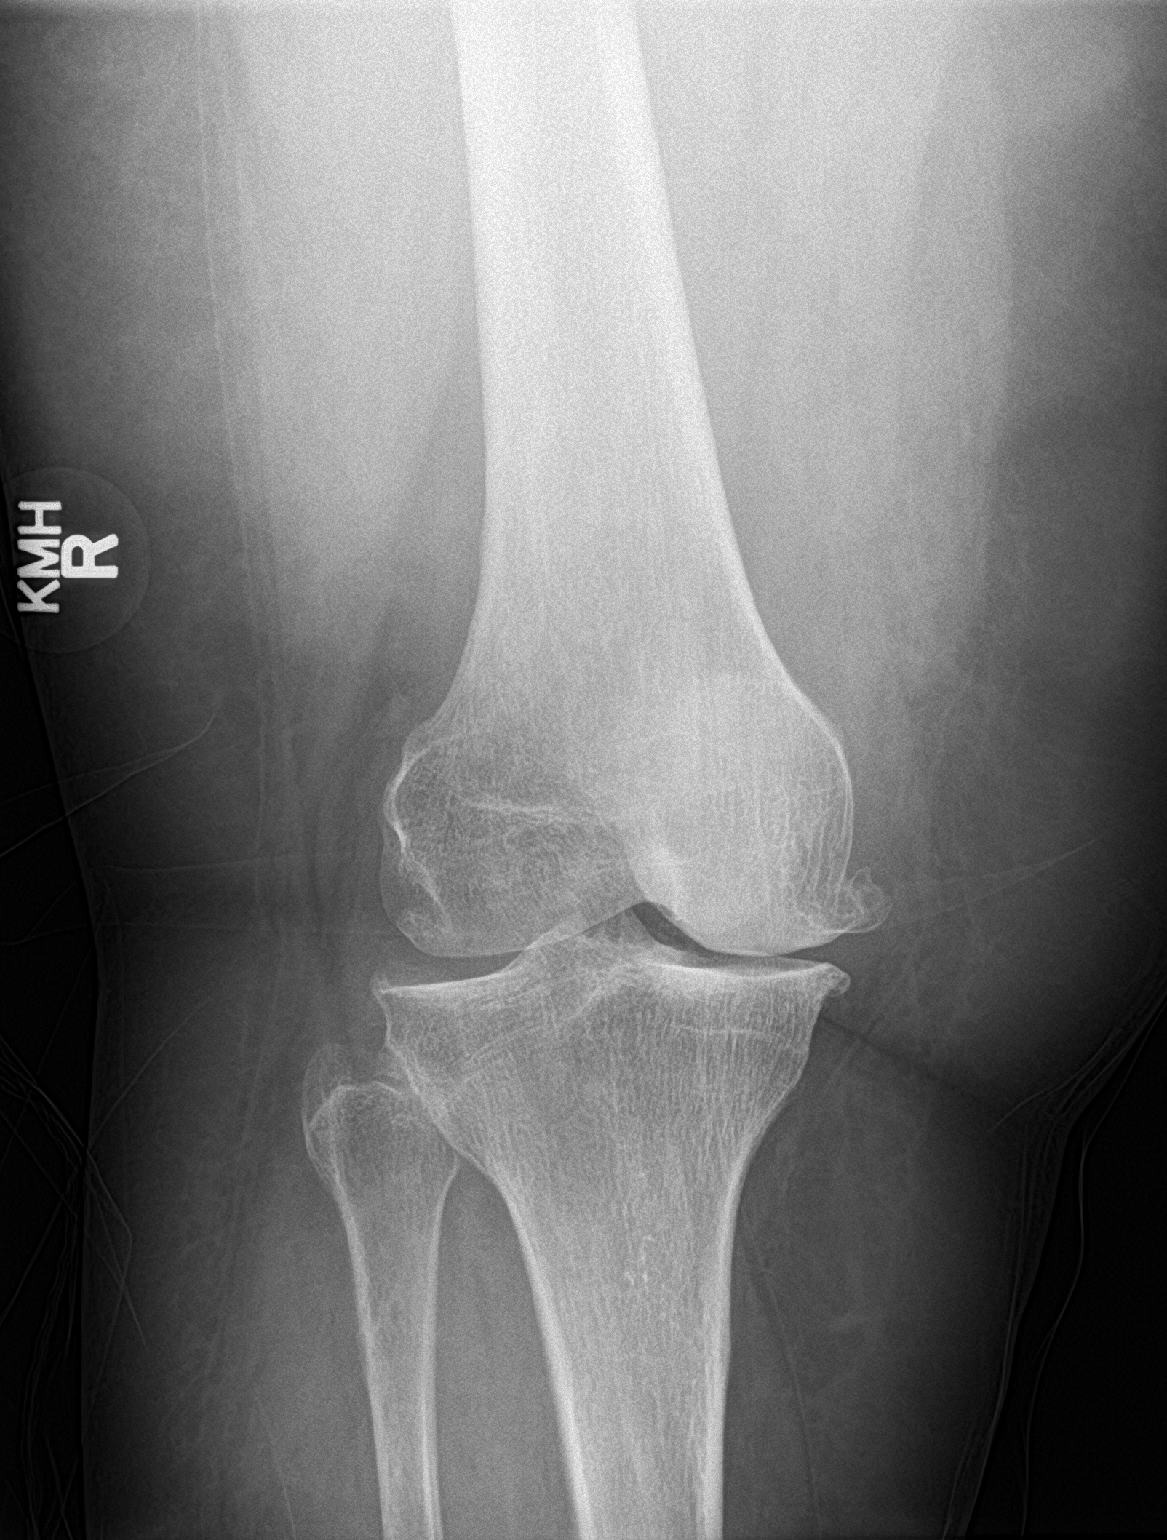

[knee lat]
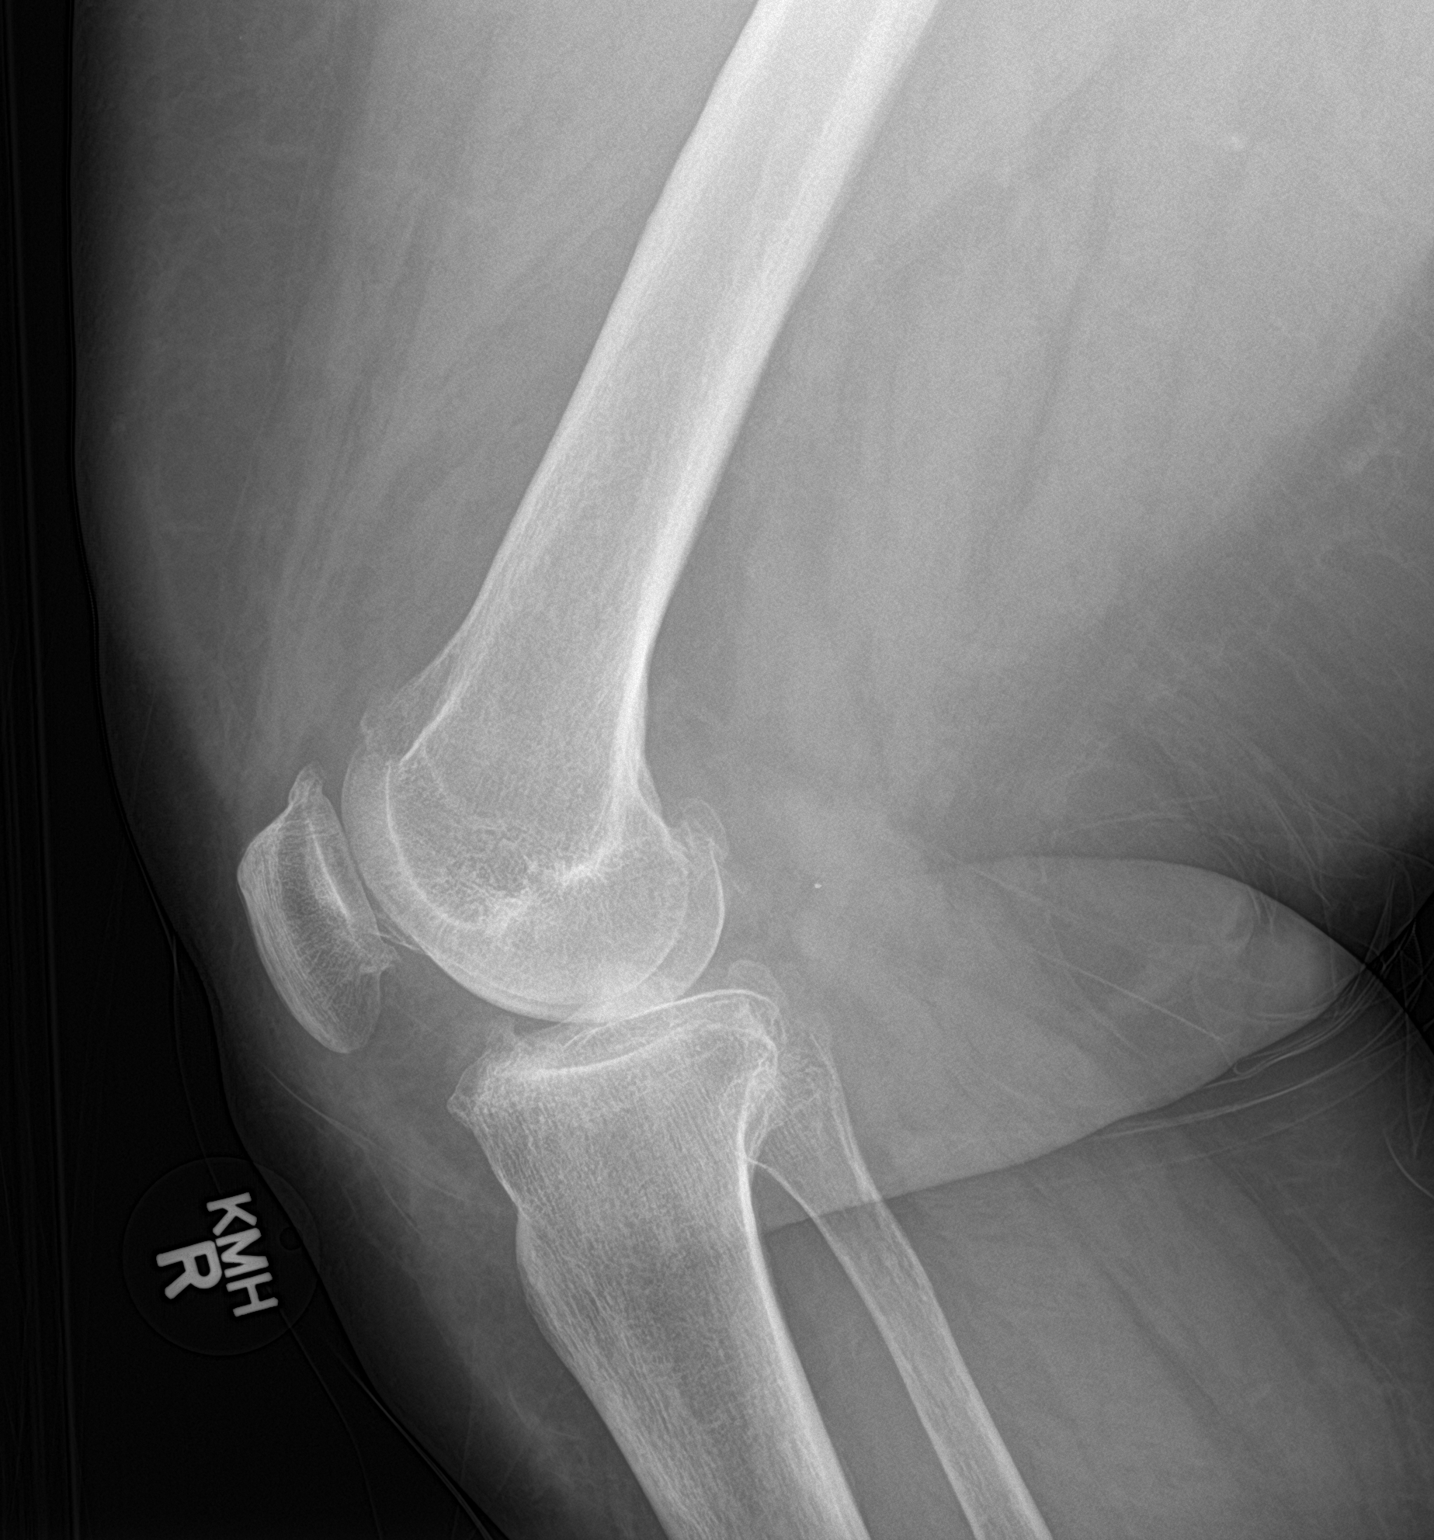

[3 of 3 positions shown; findings below may reference images not displayed]

FINDINGS: There is tricompartmental osteoarthritis, most prominent medial
compartment, progressed since the prior exam. No discrete effusion.
No fracture or dislocation.
IMPRESSION: Tricompartmental osteoarthritis, most severe in the medial
compartment. Findings are slightly worse in the right knee than in
the left knee.

## 2018-05-09 MED ORDER — KETOROLAC TROMETHAMINE 60 MG/2ML IM SOLN
60.0000 mg | Freq: Once | INTRAMUSCULAR | Status: AC
  Administered 2018-05-09: 60 mg via INTRAMUSCULAR

## 2018-05-09 MED ORDER — GABAPENTIN 300 MG PO CAPS: 300.0000 mg | ORAL_CAPSULE | Freq: Three times a day (TID) | ORAL | 3 refills | Status: DC

## 2018-05-09 MED ORDER — NAPROXEN 500 MG PO TABS: 500.0000 mg | ORAL_TABLET | Freq: Two times a day (BID) | ORAL | 0 refills | Status: DC

## 2018-05-09 MED FILL — GABAPENTIN 300 MG CAPSULE: 300 | 30 days supply | Qty: 90 | Fill #0

## 2018-05-09 MED FILL — NAPROXEN 500 MG TABLET: 500 | 15 days supply | Qty: 30 | Fill #0

## 2018-05-09 NOTE — Progress Notes (Signed)
IMPRESSION: Tricompartmental osteoarthritis, most severe in the medial compartment. Findings are slightly worse in the right knee than in the left knee.

## 2018-05-09 NOTE — Patient Instructions (Signed)
Increase gabapentin to 300 mg three times a day.  Stop meloxicam and ibuprofen.  Start naproxen BID.  Compression hose needed.   Knee Pain, Adult Many things can cause knee pain. The pain often goes away on its own with time and rest. If the pain does not go away, tests may be done to find out what is causing the pain. Follow these instructions at home: Activity  Rest your knee.  Do not do things that cause pain.  Avoid activities where both feet leave the ground at the same time (high-impact activities). Examples are running, jumping rope, and doing jumping jacks. General instructions  Take medicines only as told by your doctor.  Raise (elevate) your knee when you are resting. Make sure your knee is higher than your heart.  Sleep with a pillow under your knee.  If told, put ice on the knee: ? Put ice in a plastic bag. ? Place a towel between your skin and the bag. ? Leave the ice on for 20 minutes, 2-3 times a day.  Ask your doctor if you should wear an elastic knee support.  Lose weight if you are overweight. Being overweight can make your knee hurt more.  Do not use any tobacco products. These include cigarettes, chewing tobacco, or electronic cigarettes. If you need help quitting, ask your doctor. Smoking may slow down healing. Contact a doctor if:  The pain does not stop.  The pain changes or gets worse.  You have a fever along with knee pain.  Your knee gives out or locks up.  Your knee swells, and becomes worse. Get help right away if:  Your knee feels warm.  You cannot move your knee.  You have very bad knee pain.  You have chest pain.  You have trouble breathing. Summary  Many things can cause knee pain. The pain often goes away on its own with time and rest.  Avoid activities that put stress on your knee. These include running and jumping rope.  Get help right away if you cannot move your knee, or if your knee feels warm, or if you have trouble  breathing. This information is not intended to replace advice given to you by your health care provider. Make sure you discuss any questions you have with your health care provider. Document Released: 11/02/2008 Document Revised: 07/31/2016 Document Reviewed: 07/31/2016 Elsevier Interactive Patient Education  2017 [employer].

## 2018-05-09 NOTE — Progress Notes (Signed)
  Patient Caroline Ryan   Progress Note: General Provider: Lanae Boast, FNP  SUBJECTIVE:   Caroline Ryan is a 52 y.o. female who  has a past medical history of Anemia, Asthma, Depression, Fibroid, Headache(784.0), History of blood transfusion, Hypertension, Knee pain, bilateral, Seasonal allergies, and SVD (spontaneous vaginal delivery).. Patient presents today for Leg Pain (BILATERAL LEG PAIN. ) Patient states that she is getting muscle cramps and burning sensations in both legs that is worse at night. She states that her legs feel heavy and her legs swell with long periods of standing. Patient states that "I have to pull myself up the steps by pulling the rail".  Patient states that she is taking meloxicam and takes ibuprofen throughout the day.   Review of Systems  Musculoskeletal: Positive for joint pain (bilateral knees).  All other systems reviewed and are negative.    OBJECTIVE: BP (!) 163/95 (BP Location: Left Arm, Patient Position: Sitting, Cuff Size: Large)   Pulse 80   Temp 97.9 F (36.6 C) (Oral)   Resp 16   Ht 5' (1.524 m)   Wt 245 lb (111.1 kg)   LMP 04/03/2017 (Approximate)   SpO2 100%   BMI 47.85 kg/m   Physical Exam  Constitutional: She is oriented to person, place, and time. She appears well-developed and well-nourished. No distress.  HENT:  Head: Normocephalic and atraumatic.  Eyes: Pupils are equal, round, and reactive to light. Conjunctivae and EOM are normal.  Neck: Normal range of motion.  Abdominal: Soft. Bowel sounds are normal. She exhibits no distension.  Musculoskeletal: Normal range of motion. She exhibits edema and tenderness (bilateral knees. No erythema or warmth. There does not appear to be any swelling. She has multiple varicosities noted to the lower extremities bilaterally. ).  Neurological: She is alert and oriented to person, place, and time.  Skin: Skin is warm and dry.  Psychiatric: She has a  normal mood and affect. Her behavior is normal. Thought content normal.  Nursing note and vitals reviewed.   ASSESSMENT/PLAN:  1. Bilateral chronic knee pain Increase gabapentin to 300 mg TID.  Stop meloxicam and ibuprofen.  Start naproxen BID.  Compression hose needed.  Discussed weight loss to help with knee pain.  - DG Knee 3 Views Left; Future - DG Knee 3 Views Right; Future - naproxen (NAPROSYN) 500 MG tablet; Take 1 tablet (500 mg total) by mouth 2 (two) times daily with a meal.  Dispense: 30 tablet; Refill: 0 - Arthritis Panel - Comprehensive metabolic panel - gabapentin (NEURONTIN) 300 MG capsule; Take 1 capsule (300 mg total) by mouth 3 (three) times daily.  Dispense: 90 capsule; Refill: 3   Patient to follow up with her PCP.    The patient was given clear instructions to go to ER or return to medical center if symptoms do not improve, worsen or new problems develop. The patient verbalized understanding and agreed with plan of Ryan.   Ms. Doug Sou. Nathaneil Canary, FNP-BC [facility] [address] [place], [place] [address] [phone]     This note has been created with Dragon speech recognition software and smart phrase technology. Any transcriptional errors are unintentional.

## 2018-05-10 LAB — COMPREHENSIVE METABOLIC PANEL
ALT: 15 IU/L (ref 0–32)
AST: 14 IU/L (ref 0–40)
Albumin/Globulin Ratio: 1.5 (ref 1.2–2.2)
Albumin: 4.4 g/dL (ref 3.5–5.5)
Alkaline Phosphatase: 102 IU/L (ref 39–117)
BUN/Creatinine Ratio: 23 (ref 9–23)
BUN: 14 mg/dL (ref 6–24)
Bilirubin Total: 0.4 mg/dL (ref 0.0–1.2)
CO2: 26 mmol/L (ref 20–29)
Calcium: 10.1 mg/dL (ref 8.7–10.2)
Chloride: 101 mmol/L (ref 96–106)
Creatinine, Ser: 0.6 mg/dL (ref 0.57–1.00)
GFR calc Af Amer: 120 mL/min/{1.73_m2} (ref >59)
GFR calc non Af Amer: 104 mL/min/{1.73_m2} (ref >59)
Globulin, Total: 3 g/dL (ref 1.5–4.5)
Glucose: 89 mg/dL (ref 65–99)
Potassium: 3.8 mmol/L (ref 3.5–5.2)
Sodium: 139 mmol/L (ref 134–144)
Total Protein: 7.4 g/dL (ref 6.0–8.5)

## 2018-05-10 LAB — ARTHRITIS PANEL
Anti Nuclear Antibody(ANA): NEGATIVE
Rhuematoid fact SerPl-aCnc: <10 IU/mL (ref 0.0–13.9)
Sed Rate: 34 mm/hr (ref 0–40)
Uric Acid: 3.4 mg/dL (ref 2.5–7.1)

## 2018-05-12 ENCOUNTER — Ambulatory Visit: Payer: Self-pay | Admitting: Family Medicine

## 2018-05-14 ENCOUNTER — Other Ambulatory Visit: Payer: Self-pay | Admitting: Family Medicine

## 2018-05-14 DIAGNOSIS — R600 Localized edema: Secondary | ICD-10-CM

## 2018-05-14 MED ORDER — FUROSEMIDE 20 MG PO TABS: 20.0000 mg | ORAL_TABLET | Freq: Every day | ORAL | 1 refills | Status: DC

## 2018-05-14 MED FILL — FUROSEMIDE 20 MG TABLET: 20 | 30 days supply | Qty: 30 | Fill #0

## 2018-05-14 NOTE — Progress Notes (Signed)
Rx for Lasix to pharmacy today.

## 2018-05-26 ENCOUNTER — Other Ambulatory Visit: Payer: Self-pay | Admitting: Family Medicine

## 2018-05-26 DIAGNOSIS — G8929 Other chronic pain: Secondary | ICD-10-CM

## 2018-05-26 DIAGNOSIS — M25561 Pain in right knee: Principal | ICD-10-CM

## 2018-05-26 DIAGNOSIS — M25562 Pain in left knee: Principal | ICD-10-CM

## 2018-05-26 MED FILL — IBUPROFEN 800 MG TABLET: 800 | 10 days supply | Qty: 30 | Fill #1

## 2018-05-26 MED FILL — LISINOPRIL-HCTZ 20-25 MG TA: 20-25 | 30 days supply | Qty: 30 | Fill #0

## 2018-05-26 MED FILL — FUROSEMIDE 20 MG TABLET: 20 | 30 days supply | Qty: 30 | Fill #0

## 2018-05-30 MED FILL — NAPROXEN 500 MG TABLET: 500 | 15 days supply | Qty: 30 | Fill #0

## 2018-06-09 ENCOUNTER — Ambulatory Visit (INDEPENDENT_AMBULATORY_CARE_PROVIDER_SITE_OTHER): Payer: Self-pay | Admitting: Family Medicine

## 2018-06-09 ENCOUNTER — Encounter: Payer: Self-pay | Admitting: Family Medicine

## 2018-06-09 VITALS — BP 152/84 | HR 90 | Temp 98.2°F | Ht 60.0 in | Wt 248.0 lb

## 2018-06-09 DIAGNOSIS — M25561 Pain in right knee: Secondary | ICD-10-CM

## 2018-06-09 DIAGNOSIS — M25562 Pain in left knee: Secondary | ICD-10-CM

## 2018-06-09 DIAGNOSIS — G8929 Other chronic pain: Secondary | ICD-10-CM

## 2018-06-09 DIAGNOSIS — R232 Flushing: Secondary | ICD-10-CM

## 2018-06-09 DIAGNOSIS — Z23 Encounter for immunization: Secondary | ICD-10-CM

## 2018-06-09 DIAGNOSIS — N390 Urinary tract infection, site not specified: Secondary | ICD-10-CM

## 2018-06-09 DIAGNOSIS — I10 Essential (primary) hypertension: Secondary | ICD-10-CM

## 2018-06-09 DIAGNOSIS — L309 Dermatitis, unspecified: Secondary | ICD-10-CM

## 2018-06-09 DIAGNOSIS — Z09 Encounter for follow-up examination after completed treatment for conditions other than malignant neoplasm: Secondary | ICD-10-CM

## 2018-06-09 DIAGNOSIS — G629 Polyneuropathy, unspecified: Secondary | ICD-10-CM

## 2018-06-09 DIAGNOSIS — R600 Localized edema: Secondary | ICD-10-CM

## 2018-06-09 DIAGNOSIS — J4520 Mild intermittent asthma, uncomplicated: Secondary | ICD-10-CM

## 2018-06-09 LAB — POCT URINALYSIS DIP (MANUAL ENTRY)
Glucose, UA: NEGATIVE mg/dL
Ketones, POC UA: NEGATIVE mg/dL
Nitrite, UA: NEGATIVE
Protein Ur, POC: NEGATIVE mg/dL
Spec Grav, UA: 1.02 (ref 1.010–1.025)
Urobilinogen, UA: 0.2 E.U./dL
pH, UA: 6 (ref 5.0–8.0)

## 2018-06-09 MED ORDER — KETOROLAC TROMETHAMINE 60 MG/2ML IM SOLN
60.0000 mg | Freq: Once | INTRAMUSCULAR | Status: AC
  Administered 2018-06-09: 60 mg via INTRAMUSCULAR

## 2018-06-09 MED ORDER — DIPHENHYDRAMINE HCL 25 MG PO TABS: 25.0000 mg | ORAL_TABLET | Freq: Four times a day (QID) | ORAL | 0 refills | Status: DC | PRN

## 2018-06-09 MED ORDER — SULFAMETHOXAZOLE-TRIMETHOPRIM 800-160 MG PO TABS: 1.0000 | ORAL_TABLET | Freq: Two times a day (BID) | ORAL | 0 refills | Status: DC

## 2018-06-09 MED ORDER — CETIRIZINE HCL 10 MG PO TABS: 10.0000 mg | ORAL_TABLET | Freq: Every day | ORAL | 2 refills | Status: DC

## 2018-06-09 MED ORDER — DOCUSATE SODIUM 100 MG PO CAPS: 100.0000 mg | ORAL_CAPSULE | Freq: Two times a day (BID) | ORAL | 2 refills | Status: DC | PRN

## 2018-06-09 MED ORDER — DICLOFENAC SODIUM 1 % TD GEL: 2.0000 g | Freq: Four times a day (QID) | TRANSDERMAL | 3 refills | Status: DC

## 2018-06-09 MED ORDER — CETIRIZINE HCL 10 MG PO TABS: 10.0000 mg | ORAL_TABLET | Freq: Every day | ORAL | 3 refills | Status: DC

## 2018-06-09 MED ORDER — HYDROCORTISONE 2.5 % EX CREA: TOPICAL_CREAM | Freq: Two times a day (BID) | CUTANEOUS | 0 refills | Status: DC

## 2018-06-09 NOTE — Progress Notes (Signed)
Follow Up  Subjective:    Patient ID: Caroline Ryan, female    DOB: [birth year], 52 y.o.   MRN: [id number]   Chief Complaint  Patient presents with  . Follow-up    chronic condition   . Eczema   HPI  Caroline Ryan is a 52 year old female with a past medical history of SVD, Seasonal Allergies, Bilateral Chronic Knee Pain, Hypertension, Headaches, Fibroids, Depression, Asthma, and Anemia. She is here today for follow up and assessment and management of chronic diseases.   Current Status: Since her last office visit, she has had increased pain, burning, and warmth in her left leg. She has a history of osteoarthritis bilaterally chronic knee pain. She continues take pain medications as prescribed for her pain. She has been having increased symptoms in her left leg for about 2 months now. Her blood pressure is elevated today, which she r/t her increasing pain. She continues to take all medications as prescribed.   She denies fevers, chills, fatigue, recent infections, weight loss, and night sweats. She has not had any headaches, visual changes, dizziness, and falls. No chest pain, heart palpitations, cough and shortness of breath reported. No reports of GI problems such as nausea, vomiting, diarrhea, and constipation. She has no reports of blood in stools, dysuria and hematuria. No depression or anxiety reported.   Past Medical History:  Diagnosis Date  . Anemia   . Asthma   . Depression    no meds  . Eczema   . Fibroid   . Headache(784.0)    otc prn med  . History of blood transfusion   . Hypertension   . Knee pain, bilateral    arthralgia knee pain bilateral  . Seasonal allergies   . SVD (spontaneous vaginal delivery)    x 3    Family History  Problem Relation Age of Onset  . Hypertension Father   . Diabetes Brother   . Diabetes Brother   . Breast cancer Mother 21  . Other Neg Hx     Social History   Socioeconomic History  . Marital status: Single    Spouse name: Not on  file  . Number of children: Not on file  . Years of education: Not on file  . Highest education level: Not on file  Occupational History  . Not on file  Social Needs  . Financial resource strain: Not on file  . Food insecurity:    Worry: Not on file    Inability: Not on file  . Transportation needs:    Medical: Not on file    Non-medical: Not on file  Tobacco Use  . Smoking status: Former Smoker    Packs/day: 0.25    Years: 2.00    Pack years: 0.50    Types: Cigarettes    Last attempt to quit: 08/20/2014    Years since quitting: 3.8  . Smokeless tobacco: Never Used  Substance and Sexual Activity  . Alcohol use: Yes    Alcohol/week: 2.0 standard drinks    Types: 2 Glasses of wine per week  . Drug use: No  . Sexual activity: Yes    Birth control/protection: Condom  Lifestyle  . Physical activity:    Days per week: Not on file    Minutes per session: Not on file  . Stress: Not on file  Relationships  . Social connections:    Talks on phone: Not on file    Gets together: Not on file    Attends  religious service: Not on file    Active member of club or organization: Not on file    Attends meetings of clubs or organizations: Not on file    Relationship status: Not on file  . Intimate partner violence:    Fear of current or ex partner: Not on file    Emotionally abused: Not on file    Physically abused: Not on file    Forced sexual activity: Not on file  Other Topics Concern  . Not on file  Social History Narrative  . Not on file    Past Surgical History:  Procedure Laterality Date  . ABDOMINAL HYSTERECTOMY    . BILATERAL SALPINGECTOMY Bilateral 10/10/2017   Procedure: BILATERAL SALPINGECTOMY;  Surgeon: Chancy Milroy, MD;  Location: [facility];  Service: Gynecology;  Laterality: Bilateral;  . BREAST BIOPSY     US guided core 2009  . BREAST EXCISIONAL BIOPSY     right 1982 left 1981  . BREAST SURGERY     bilateral cysts/benign  . VAGINAL HYSTERECTOMY N/A 10/10/2017    Procedure: HYSTERECTOMY VAGINAL;  Surgeon: Chancy Milroy, MD;  Location: [facility];  Service: Gynecology;  Laterality: N/A;    Immunization History  Administered Date(s) Administered  . Influenza Whole 06/30/2009  . Influenza,inj,Quad PF,6+ Mos 06/27/2015, 07/04/2016, 06/09/2018  . Pneumococcal Polysaccharide-23 04/14/2008, 06/27/2015  . Td 10/18/2005  . Tdap 07/04/2016    Current Meds  Medication Sig  . albuterol (PROVENTIL HFA;VENTOLIN HFA) 108 (90 Base) MCG/ACT inhaler Inhale 2 puffs into the lungs every 6 (six) hours as needed for wheezing or shortness of breath. Asthma attacks  . cetirizine (ZYRTEC ALLERGY) 10 MG tablet Take 1 tablet (10 mg total) by mouth daily.  . diphenhydrAMINE (BENADRYL) 25 MG tablet Take 1 tablet (25 mg total) by mouth every 6 (six) hours as needed for itching.  . docusate sodium (COLACE) 100 MG capsule Take 1 capsule (100 mg total) by mouth 2 (two) times daily as needed.  . furosemide (LASIX) 20 MG tablet Take 1 tablet (20 mg total) by mouth daily.  Marland Kitchen gabapentin (NEURONTIN) 300 MG capsule Take 1 capsule (300 mg total) by mouth 3 (three) times daily.  . hydrOXYzine (ATARAX/VISTARIL) 25 MG tablet TAKE 1 TABLET BY MOUTH EVERY 6 HOURS AS NEEDED FOR ITCHING.  Marland Kitchen lisinopril-hydrochlorothiazide (PRINZIDE,ZESTORETIC) 20-25 MG tablet Take 1 tablet by mouth daily.  . meloxicam (MOBIC) 15 MG tablet TAKE 1 TABLET BY MOUTH DAILY.  . naproxen (NAPROSYN) 500 MG tablet TAKE 1 TABLET BY MOUTH 2 TIMES DAILY WITH A MEAL.  . [DISCONTINUED] cetirizine (ZYRTEC ALLERGY) 10 MG tablet Take 1 tablet (10 mg total) by mouth daily.  . [DISCONTINUED] cetirizine (ZYRTEC ALLERGY) 10 MG tablet Take 1 tablet (10 mg total) by mouth daily.  . [DISCONTINUED] diphenhydrAMINE (BENADRYL) 25 MG tablet Take 1 tablet (25 mg total) by mouth every 6 (six) hours as needed for itching.  . [DISCONTINUED] diphenhydrAMINE (BENADRYL) 25 MG tablet Take 1 tablet (25 mg total) by mouth every 6 (six) hours as  needed for itching.  . [DISCONTINUED] docusate sodium (COLACE) 100 MG capsule Take 1 capsule (100 mg total) by mouth 2 (two) times daily as needed.    No Known Allergies  BP (!) 152/84 (BP Location: Right Arm, Patient Position: Sitting, Cuff Size: Large)   Pulse 90   Temp 98.2 F (36.8 C) (Oral)   Ht 5' (1.524 m)   Wt 248 lb (112.5 kg)   LMP 04/03/2017 (Approximate)   SpO2 98%  BMI 48.43 kg/m   Review of Systems  Constitutional: Negative.   HENT: Negative.   Eyes: Negative.   Respiratory: Negative.   Cardiovascular: Negative.   Gastrointestinal: Positive for abdominal distention (Obese).  Genitourinary: Negative.   Musculoskeletal: Negative.   Skin: Negative.   Neurological: Negative.   Psychiatric/Behavioral: The patient is nervous/anxious (Moderate anxiety today. ).    Objective:   Physical Exam  Constitutional: She is oriented to person, place, and time. She appears well-developed and well-nourished.  HENT:  Head: Normocephalic and atraumatic.  Right Ear: External ear normal.  Left Ear: External ear normal.  Nose: Nose normal.  Mouth/Throat: Oropharynx is clear and moist.  Eyes: Pupils are equal, round, and reactive to light. Conjunctivae and EOM are normal.  Neck: Normal range of motion. Neck supple.  Cardiovascular: Normal rate, regular rhythm, normal heart sounds and intact distal pulses.  Pulmonary/Chest: Effort normal and breath sounds normal.  Abdominal: Soft. Bowel sounds are normal. She exhibits distension (Obese).  Musculoskeletal:  Chronic bilateral knee pain; r/t Osteoarthritis.  Neurological: She is alert and oriented to person, place, and time.  Skin: Skin is warm and dry.  Psychiatric: She has a normal mood and affect. Her behavior is normal. Judgment and thought content normal.  Nursing note and vitals reviewed.  Assessment & Plan:   1. Essential hypertension Blood pressure is elevated, probable because of increased pain. Continue Lasix and  Lisinopril as prescribed. We will re-evaluate at next office visit. He will continue to decrease high sodium intake, excessive alcohol intake, increase potassium intake, smoking cessation, and increase physical activity of at least 30 minutes of cardio activity daily. He will continue to follow Heart Healthy or DASH diet. - POCT urinalysis dipstick - Urine Culture  2. Mild intermittent asthma, unspecified whether complicated Continue Zyrtec as prescribed. - cetirizine (ZYRTEC ALLERGY) 10 MG tablet; Take 1 tablet (10 mg total) by mouth daily.  Dispense: 30 tablet; Refill: 3  3. Need for immunization against influenza - Flu Vaccine QUAD 36+ mos IM  4. Eczema, unspecified type We will initiate Hydrocortisone cream today. She will use emollients as needed for skin moisturizer, avoid hot baths, avoid clothing containing wool, avoid fragranced or perfumed fabric softeners and lotions, and use lotions like Eucerin. - hydrocortisone 2.5 % cream; Apply topically 2 (two) times daily.  Dispense: 30 g; Refill: 0  5. Chronic pain of right knee Initiate Voltaren gel. Continue Naproxen as prescribed.  - diclofenac sodium (VOLTAREN) 1 % GEL; Apply 2 g topically 4 (four) times daily.  Dispense: 2 g; Refill: 3 - ketorolac (TORADOL) injection 60 mg  6. Chronic pain of left knee She is having signs and symptoms of DVT. We will order Venous Doppler on left leg today. We will initiate Voltaren gel as prescribed. Continue Naproxen as prescribed. We will give Toradol injection at office today.  - diclofenac sodium (VOLTAREN) 1 % GEL; Apply 2 g topically 4 (four) times daily.  Dispense: 2 g; Refill: 3 - ketorolac (TORADOL) injection 60 mg - US Venous Img Lower Unilateral Left; Future  7. Neuropathy Continue Gabapentin as prescribed. Monitor.   8. Hot flashes Stable. Not worsening. She will continue Gabapentin as prescribed. Monitor.   9. Bilateral lower extremity edema - VAS [place] LOWER EXTREMITY VENOUS (DVT);  Future  10. Urinary tract infection without hematuria, site unspecified - sulfamethoxazole-trimethoprim (BACTRIM DS,SEPTRA DS) 800-160 MG tablet; Take 1 tablet by mouth 2 (two) times daily.  Dispense: 14 tablet; Refill: 0  11. Follow up  She will follow up in 1 month.   Meds ordered this encounter  Medications  . docusate sodium (COLACE) 100 MG capsule    Sig: Take 1 capsule (100 mg total) by mouth 2 (two) times daily as needed.    Dispense:  30 capsule    Refill:  2  . DISCONTD: diphenhydrAMINE (BENADRYL) 25 MG tablet    Sig: Take 1 tablet (25 mg total) by mouth every 6 (six) hours as needed for itching.    Dispense:  20 tablet    Refill:  0  . DISCONTD: cetirizine (ZYRTEC ALLERGY) 10 MG tablet    Sig: Take 1 tablet (10 mg total) by mouth daily.    Dispense:  30 tablet    Refill:  2  . diclofenac sodium (VOLTAREN) 1 % GEL    Sig: Apply 2 g topically 4 (four) times daily.    Dispense:  2 g    Refill:  3  . hydrocortisone 2.5 % cream    Sig: Apply topically 2 (two) times daily.    Dispense:  30 g    Refill:  0  . ketorolac (TORADOL) injection 60 mg  . sulfamethoxazole-trimethoprim (BACTRIM DS,SEPTRA DS) 800-160 MG tablet    Sig: Take 1 tablet by mouth 2 (two) times daily.    Dispense:  14 tablet    Refill:  0  . diphenhydrAMINE (BENADRYL) 25 MG tablet    Sig: Take 1 tablet (25 mg total) by mouth every 6 (six) hours as needed for itching.    Dispense:  20 tablet    Refill:  0  . cetirizine (ZYRTEC ALLERGY) 10 MG tablet    Sig: Take 1 tablet (10 mg total) by mouth daily.    Dispense:  30 tablet    Refill:  [place],  MSN, FNP-C [facility] [address] [place], [place] [address] [phone]

## 2018-06-09 NOTE — Patient Instructions (Addendum)
Hydrocortisone skin cream, ointment, lotion, or solution What is this medicine? HYDROCORTISONE (hye droe KOR ti sone) is a corticosteroid. It is used on the skin to reduce swelling, redness, itching, and allergic reactions. This medicine may be used for other purposes; ask your health care provider or pharmacist if you have questions. COMMON BRAND NAME(S): Ala-Cort, Ala-Scalp, Anusol HC, Aqua Glycolic HC, Balneol for Her, Caldecort, Cetacort, Cortaid, Cortaid Advanced, Cortaid Intensive Therapy, Cortaid Sensitive Skin, CortAlo, Corticaine, Corticool, Cortizone, Cortizone-10, Cortizone-10 Cooling Relief, Cortizone-10 Intensive Healing, Cortizone-10 Plus, Dermarest Dricort, Dermarest Eczema, DERMASORB HC Complete, Gly-Cort, Hycort, Hydro Skin, Hydroskin, Hytone, Instacort, Lacticare HC, Locoid, Locoid Lipocream, MiCort-HC, Monistat Complete Care Instant Itch Relief Cream, Neosporin Eczema, NuCort, Nutracort, NuZon, Pandel, Pediaderm HC, Penecort, Preparation H Hydrocortisone, Procto-Kit, Procto-Med HC, Proctocream-HC, Proctosol-HC, Proctozone-HC, Rederm, Sarnol-HC, Nurse, adult, Engineer, site, Contractor, Tucks HC, Human resources officer What should I tell my health care provider before I take this medicine? They need to know if you have any of these conditions: -any active infection -diabetes -large areas of burned or damaged skin -skin wasting or thinning -an unusual or allergic reaction to hydrocortisone, corticosteroids, sulfites, other medicines, foods, dyes, or preservatives -pregnant or trying to get pregnant -breast-feeding How should I use this medicine? This medicine is for external use only. Do not take by mouth. Follow the directions on the prescription label. Wash your hands before and after use. Apply a thin film of medicine to the affected area. Do not cover with a bandage or dressing unless your doctor or health care professional tells you to. Do not use on healthy skin or over large areas of skin.  Do not get this medicine in your eyes. If you do, rinse out with plenty of cool tap water. Do not to use more medicine than prescribed. Do not use your medicine more often than directed or for more than 14 days. Talk to your pediatrician regarding the use of this medicine in children. Special care may be needed. While this drug may be prescribed for children as young as 55 years of age for selected conditions, precautions do apply. Do not use this medicine for the treatment of diaper rash unless directed to do so by your doctor or health care professional. If applying this medicine to the diaper area of a child, do not cover with tight-fitting diapers or plastic pants. This may increase the amount of medicine that passes through the skin and increase the risk of serious side effects. Elderly patients are more likely to have damaged skin through aging, and this may increase side effects. This medicine should only be used for brief periods and infrequently in older patients. Overdosage: If you think you have taken too much of this medicine contact a poison control center or emergency room at once. NOTE: This medicine is only for you. Do not share this medicine with others. What if I miss a dose? If you miss a dose, use it as soon as you can. If it is almost time for your next dose, use only that dose. Do not use double or extra doses. What may interact with this medicine? Interactions are not expected. Do not use any other skin products on the affected area without asking your doctor or health care professional. This list may not describe all possible interactions. Give your health care provider a list of all the medicines, herbs, non-prescription drugs, or dietary supplements you use. Also tell them if you smoke, drink alcohol, or use illegal drugs. Some items may interact with your  medicine. What should I watch for while using this medicine? Tell your doctor or health care professional if your symptoms do  not start to get better within 7 days or if they get worse. Tell your doctor or health care professional if you are exposed to anyone with measles or chickenpox, or if you develop sores or blisters that do not heal properly. What side effects may I notice from receiving this medicine? Side effects that you should report to your doctor or health care professional as soon as possible: -allergic reactions like skin rash, itching or hives, swelling of the face, lips, or tongue -burning feeling on the skin -dark red spots on the skin -infection -lack of healing of skin condition -painful, red, pus filled blisters in hair follicles -thinning of the skin Side effects that usually do not require medical attention (report to your doctor or health care professional if they continue or are bothersome): -dry skin, irritation -unusual increased growth of hair on the face or body This list may not describe all possible side effects. Call your doctor for medical advice about side effects. You may report side effects to FDA at 1-800-FDA-1088. Where should I keep my medicine? Keep out of the reach of children. Store at room temperature between 15 and 30 degrees C (59 and 86 degrees F). Do not freeze. Throw away any unused medicine after the expiration date. NOTE: This sheet is a summary. It may not cover all possible information. If you have questions about this medicine, talk to your doctor, pharmacist, or health care provider.  2018 Elsevier/Gold Standard (2007-12-19 16:08:48) Calorie Counting for Weight Loss Calories are units of energy. Your body needs a certain amount of calories from food to keep you going throughout the day. When you eat more calories than your body needs, your body stores the extra calories as fat. When you eat fewer calories than your body needs, your body burns fat to get the energy it needs. Calorie counting means keeping track of how many calories you eat and drink each day.  Calorie counting can be helpful if you need to lose weight. If you make sure to eat fewer calories than your body needs, you should lose weight. Ask your health care provider what a healthy weight is for you. For calorie counting to work, you will need to eat the right number of calories in a day in order to lose a healthy amount of weight per week. A dietitian can help you determine how many calories you need in a day and will give you suggestions on how to reach your calorie goal.  A healthy amount of weight to lose per week is usually 1-2 lb (0.5-0.9 kg). This usually means that your daily calorie intake should be reduced by 500-750 calories.  Eating 1,200 - 1,500 calories per day can help most women lose weight.  Eating 1,500 - 1,800 calories per day can help most men lose weight.  What is my plan? My goal is to have __________ calories per day. If I have this many calories per day, I should lose around __________ pounds per week. What do I need to know about calorie counting? In order to meet your daily calorie goal, you will need to:  Find out how many calories are in each food you would like to eat. Try to do this before you eat.  Decide how much of the food you plan to eat.  Write down what you ate and how many calories it  had. Doing this is called keeping a food log.  To successfully lose weight, it is important to balance calorie counting with a healthy lifestyle that includes regular activity. Aim for 150 minutes of moderate exercise (such as walking) or 75 minutes of vigorous exercise (such as running) each week. Where do I find calorie information?  The number of calories in a food can be found on a Nutrition Facts label. If a food does not have a Nutrition Facts label, try to look up the calories online or ask your dietitian for help. Remember that calories are listed per serving. If you choose to have more than one serving of a food, you will have to multiply the calories per  serving by the amount of servings you plan to eat. For example, the label on a package of bread might say that a serving size is 1 slice and that there are 90 calories in a serving. If you eat 1 slice, you will have eaten 90 calories. If you eat 2 slices, you will have eaten 180 calories. How do I keep a food log? Immediately after each meal, record the following information in your food log:  What you ate. Don't forget to include toppings, sauces, and other extras on the food.  How much you ate. This can be measured in cups, ounces, or number of items.  How many calories each food and drink had.  The total number of calories in the meal.  Keep your food log near you, such as in a small notebook in your pocket, or use a mobile app or website. Some programs will calculate calories for you and show you how many calories you have left for the day to meet your goal. What are some calorie counting tips?  Use your calories on foods and drinks that will fill you up and not leave you hungry: ? Some examples of foods that fill you up are nuts and nut butters, vegetables, lean proteins, and high-fiber foods like whole grains. High-fiber foods are foods with more than 5 g fiber per serving. ? Drinks such as sodas, specialty coffee drinks, alcohol, and juices have a lot of calories, yet do not fill you up.  Eat nutritious foods and avoid empty calories. Empty calories are calories you get from foods or beverages that do not have many vitamins or protein, such as candy, sweets, and soda. It is better to have a nutritious high-calorie food (such as an avocado) than a food with few nutrients (such as a bag of chips).  Know how many calories are in the foods you eat most often. This will help you calculate calorie counts faster.  Pay attention to calories in drinks. Low-calorie drinks include water and unsweetened drinks.  Pay attention to nutrition labels for "low fat" or "fat free" foods. These foods  sometimes have the same amount of calories or more calories than the full fat versions. They also often have added sugar, starch, or salt, to make up for flavor that was removed with the fat.  Find a way of tracking calories that works for you. Get creative. Try different apps or programs if writing down calories does not work for you. What are some portion control tips?  Know how many calories are in a serving. This will help you know how many servings of a certain food you can have.  Use a measuring cup to measure serving sizes. You could also try weighing out portions on a kitchen scale. With time,  you will be able to estimate serving sizes for some foods.  Take some time to put servings of different foods on your favorite plates, bowls, and cups so you know what a serving looks like.  Try not to eat straight from a bag or box. Doing this can lead to overeating. Put the amount you would like to eat in a cup or on a plate to make sure you are eating the right portion.  Use smaller plates, glasses, and bowls to prevent overeating.  Try not to multitask (for example, watch TV or use your computer) while eating. If it is time to eat, sit down at a table and enjoy your food. This will help you to know when you are full. It will also help you to be aware of what you are eating and how much you are eating. What are tips for following this plan? Reading food labels  Check the calorie count compared to the serving size. The serving size may be smaller than what you are used to eating.  Check the source of the calories. Make sure the food you are eating is high in vitamins and protein and low in saturated and trans fats. Shopping  Read nutrition labels while you shop. This will help you make healthy decisions before you decide to purchase your food.  Make a grocery list and stick to it. Cooking  Try to cook your favorite foods in a healthier way. For example, try baking instead of  frying.  Use low-fat dairy products. Meal planning  Use more fruits and vegetables. Half of your plate should be fruits and vegetables.  Include lean proteins like poultry and fish. How do I count calories when eating out?  Ask for smaller portion sizes.  Consider sharing an entree and sides instead of getting your own entree.  If you get your own entree, eat only half. Ask for a box at the beginning of your meal and put the rest of your entree in it so you are not tempted to eat it.  If calories are listed on the menu, choose the lower calorie options.  Choose dishes that include vegetables, fruits, whole grains, low-fat dairy products, and lean protein.  Choose items that are boiled, broiled, grilled, or steamed. Stay away from items that are buttered, battered, fried, or served with cream sauce. Items labeled "crispy" are usually fried, unless stated otherwise.  Choose water, low-fat milk, unsweetened iced tea, or other drinks without added sugar. If you want an alcoholic beverage, choose a lower calorie option such as a glass of wine or light beer.  Ask for dressings, sauces, and syrups on the side. These are usually high in calories, so you should limit the amount you eat.  If you want a salad, choose a garden salad and ask for grilled meats. Avoid extra toppings like bacon, cheese, or fried items. Ask for the dressing on the side, or ask for olive oil and vinegar or lemon to use as dressing.  Estimate how many servings of a food you are given. For example, a serving of cooked rice is  cup or about the size of half a baseball. Knowing serving sizes will help you be aware of how much food you are eating at restaurants. The list below tells you how big or small some common portion sizes are based on everyday objects: ? 1 oz-4 stacked dice. ? 3 oz-1 deck of cards. ? 1 tsp-1 die. ? 1 Tbsp- a ping-pong ball. ?  2 Tbsp-1 ping-pong ball. ?  cup- baseball. ? 1 cup-1  baseball. Summary  Calorie counting means keeping track of how many calories you eat and drink each day. If you eat fewer calories than your body needs, you should lose weight.  A healthy amount of weight to lose per week is usually 1-2 lb (0.5-0.9 kg). This usually means reducing your daily calorie intake by 500-750 calories.  The number of calories in a food can be found on a Nutrition Facts label. If a food does not have a Nutrition Facts label, try to look up the calories online or ask your dietitian for help.  Use your calories on foods and drinks that will fill you up, and not on foods and drinks that will leave you hungry.  Use smaller plates, glasses, and bowls to prevent overeating. This information is not intended to replace advice given to you by your health care provider. Make sure you discuss any questions you have with your health care provider. Document Released: 08/06/2005 Document Revised: 07/06/2016 Document Reviewed: 07/06/2016 Elsevier Interactive Patient Education  2018 [place] Heart-healthy meal planning includes:  Limiting unhealthy fats.  Increasing healthy fats.  Making other small dietary changes.  You may need to talk with your doctor or a diet specialist (dietitian) to create an eating plan that is right for you. What types of fat should I choose?  Choose healthy fats. These include olive oil and canola oil, flaxseeds, walnuts, almonds, and seeds.  Eat more omega-3 fats. These include salmon, mackerel, sardines, tuna, flaxseed oil, and ground flaxseeds. Try to eat fish at least twice each week.  Limit saturated fats. ? Saturated fats are often found in animal products, such as meats, butter, and cream. ? Plant sources of saturated fats include palm oil, palm kernel oil, and coconut oil.  Avoid foods with partially hydrogenated oils in them. These include stick margarine, some tub margarines, cookies, crackers, and other  baked goods. These contain trans fats. What general guidelines do I need to follow?  Check food labels carefully. Identify foods with trans fats or high amounts of saturated fat.  Fill one half of your plate with vegetables and green salads. Eat 4-5 servings of vegetables per day. A serving of vegetables is: ? 1 cup of raw leafy vegetables. ?  cup of raw or cooked cut-up vegetables. ?  cup of vegetable juice.  Fill one fourth of your plate with whole grains. Look for the word "whole" as the first word in the ingredient list.  Fill one fourth of your plate with lean protein foods.  Eat 4-5 servings of fruit per day. A serving of fruit is: ? One medium whole fruit. ?  cup of dried fruit. ?  cup of fresh, frozen, or canned fruit. ?  cup of 100% fruit juice.  Eat more foods that contain soluble fiber. These include apples, broccoli, carrots, beans, peas, and barley. Try to get 20-30 g of fiber per day.  Eat more home-cooked food. Eat less restaurant, buffet, and fast food.  Limit or avoid alcohol.  Limit foods high in starch and sugar.  Avoid fried foods.  Avoid frying your food. Try baking, boiling, grilling, or broiling it instead. You can also reduce fat by: ? Removing the skin from poultry. ? Removing all visible fats from meats. ? Skimming the fat off of stews, soups, and gravies before serving them. ? Steaming vegetables in water or broth.  Lose weight if you are  overweight.  Eat 4-5 servings of nuts, legumes, and seeds per week: ? One serving of dried beans or legumes equals  cup after being cooked. ? One serving of nuts equals 1 ounces. ? One serving of seeds equals  ounce or one tablespoon.  You may need to keep track of how much salt or sodium you eat. This is especially true if you have high blood pressure. Talk with your doctor or dietitian to get more information. What foods can I eat? Grains Breads, including [place], white, pita, wheat, raisin, rye,  oatmeal, and [place]. Tortillas that are neither fried nor made with lard or trans fat. Low-fat rolls, including hotdog and hamburger buns and English muffins. Biscuits. Muffins. Waffles. Pancakes. Light popcorn. Whole-grain cereals. Flatbread. Melba toast. Pretzels. Breadsticks. Rusks. Low-fat snacks. Low-fat crackers, including oyster, saltine, matzo, graham, animal, and rye. Rice and pasta, including brown rice and pastas that are made with whole wheat. Vegetables All vegetables. Fruits All fruits, but limit coconut. Meats and Other Protein Sources Lean, well-trimmed beef, veal, pork, and lamb. Chicken and [place] without skin. All fish and shellfish. Wild duck, rabbit, pheasant, and venison. Egg whites or low-cholesterol egg substitutes. Dried beans, peas, lentils, and tofu. Seeds and most nuts. Dairy Low-fat or nonfat cheeses, including ricotta, string, and mozzarella. Skim or 1% milk that is liquid, powdered, or evaporated. Buttermilk that is made with low-fat milk. Nonfat or low-fat yogurt. Beverages Mineral water. Diet carbonated beverages. Sweets and Desserts Sherbets and fruit ices. Honey, jam, marmalade, jelly, and syrups. Meringues and gelatins. Pure sugar candy, such as hard candy, jelly beans, gumdrops, mints, marshmallows, and small amounts of dark chocolate. [employer] Eat all sweets and desserts in moderation. Fats and Oils Nonhydrogenated (trans-free) margarines. Vegetable oils, including soybean, sesame, sunflower, olive, peanut, safflower, corn, canola, and cottonseed. Salad dressings or mayonnaise made with a vegetable oil. Limit added fats and oils that you use for cooking, baking, salads, and as spreads. Other Cocoa powder. Coffee and tea. All seasonings and condiments. The items listed above may not be a complete list of recommended foods or beverages. Contact your dietitian for more options. What foods are not recommended? Grains Breads that are made with saturated  or trans fats, oils, or whole milk. Croissants. Butter rolls. Cheese breads. Sweet rolls. Donuts. Buttered popcorn. Chow mein noodles. High-fat crackers, such as cheese or butter crackers. Meats and Other Protein Sources Fatty meats, such as hotdogs, short ribs, sausage, spareribs, bacon, rib eye roast or steak, and mutton. High-fat deli meats, such as salami and bologna. Caviar. Domestic duck and goose. Organ meats, such as kidney, liver, sweetbreads, and heart. Dairy Cream, sour cream, cream cheese, and creamed cottage cheese. Whole-milk cheeses, including blue (bleu), Monte[place] Jack, [place], [place], American, [place], Swiss, cheddar, [place], and [place]. Whole or 2% milk that is liquid, evaporated, or condensed. Whole buttermilk. Cream sauce or high-fat cheese sauce. Yogurt that is made from whole milk. Beverages Regular sodas and juice drinks with added sugar. Sweets and Desserts Frosting. Pudding. Cookies. Cakes other than angel food cake. Candy that has milk chocolate or white chocolate, hydrogenated fat, butter, coconut, or unknown ingredients. Buttered syrups. Full-fat ice cream or ice cream drinks. Fats and Oils Gravy that has suet, meat fat, or shortening. Cocoa butter, hydrogenated oils, palm oil, coconut oil, palm kernel oil. These can often be found in baked products, candy, fried foods, nondairy creamers, and whipped toppings. Solid fats and shortenings, including bacon fat, salt pork, lard, and butter. Nondairy cream  substitutes, such as coffee creamers and sour cream substitutes. Salad dressings that are made of unknown oils, cheese, or sour cream. The items listed above may not be a complete list of foods and beverages to avoid. Contact your dietitian for more information. This information is not intended to replace advice given to you by your health care provider. Make sure you discuss any questions you have with your health care provider. Document Released: 02/05/2012 Document  Revised: 01/12/2016 Document Reviewed: 01/28/2014 Elsevier Interactive Patient Education  2018 [place] Eating Plan DASH stands for "Dietary Approaches to Stop Hypertension." The DASH eating plan is a healthy eating plan that has been shown to reduce high blood pressure (hypertension). It may also reduce your risk for type 2 diabetes, heart disease, and stroke. The DASH eating plan may also help with weight loss. What are tips for following this plan? General guidelines  Avoid eating more than 2,300 mg (milligrams) of salt (sodium) a day. If you have hypertension, you may need to reduce your sodium intake to 1,500 mg a day.  Limit alcohol intake to no more than 1 drink a day for nonpregnant women and 2 drinks a day for men. One drink equals 12 oz of beer, 5 oz of wine, or 1 oz of hard liquor.  Work with your health care provider to maintain a healthy body weight or to lose weight. Ask what an ideal weight is for you.  Get at least 30 minutes of exercise that causes your heart to beat faster (aerobic exercise) most days of the week. Activities may include walking, swimming, or biking.  Work with your health care provider or diet and nutrition specialist (dietitian) to adjust your eating plan to your individual calorie needs. Reading food labels  Check food labels for the amount of sodium per serving. Choose foods with less than 5 percent of the Daily Value of sodium. Generally, foods with less than 300 mg of sodium per serving fit into this eating plan.  To find whole grains, look for the word "whole" as the first word in the ingredient list. Shopping  Buy products labeled as "low-sodium" or "no salt added."  Buy fresh foods. Avoid canned foods and premade or frozen meals. Cooking  Avoid adding salt when cooking. Use salt-free seasonings or herbs instead of table salt or sea salt. Check with your health care provider or pharmacist before using salt substitutes.  Do not fry  foods. Cook foods using healthy methods such as baking, boiling, grilling, and broiling instead.  Cook with heart-healthy oils, such as olive, canola, soybean, or sunflower oil. Meal planning   Eat a balanced diet that includes: ? 5 or more servings of fruits and vegetables each day. At each meal, try to fill half of your plate with fruits and vegetables. ? Up to 6-8 servings of whole grains each day. ? Less than 6 oz of lean meat, poultry, or fish each day. A 3-oz serving of meat is about the same size as a deck of cards. One egg equals 1 oz. ? 2 servings of low-fat dairy each day. ? A serving of nuts, seeds, or beans 5 times each week. ? Heart-healthy fats. Healthy fats called Omega-3 fatty acids are found in foods such as flaxseeds and coldwater fish, like sardines, salmon, and mackerel.  Limit how much you eat of the following: ? Canned or prepackaged foods. ? Food that is high in trans fat, such as fried foods. ? Food that is high in  saturated fat, such as fatty meat. ? Sweets, desserts, sugary drinks, and other foods with added sugar. ? Full-fat dairy products.  Do not salt foods before eating.  Try to eat at least 2 vegetarian meals each week.  Eat more home-cooked food and less restaurant, buffet, and fast food.  When eating at a restaurant, ask that your food be prepared with less salt or no salt, if possible. What foods are recommended? The items listed may not be a complete list. Talk with your dietitian about what dietary choices are best for you. Grains Whole-grain or whole-wheat bread. Whole-grain or whole-wheat pasta. Brown rice. [place]. Bulgur. Whole-grain and low-sodium cereals. Pita bread. Low-fat, low-sodium crackers. Whole-wheat flour tortillas. Vegetables Fresh or frozen vegetables (raw, steamed, roasted, or grilled). Low-sodium or reduced-sodium tomato and vegetable juice. Low-sodium or reduced-sodium tomato sauce and tomato paste. Low-sodium or  reduced-sodium canned vegetables. Fruits All fresh, dried, or frozen fruit. Canned fruit in natural juice (without added sugar). Meat and other protein foods Skinless chicken or [place]. Ground chicken or [place]. Pork with fat trimmed off. Fish and seafood. Egg whites. Dried beans, peas, or lentils. Unsalted nuts, nut butters, and seeds. Unsalted canned beans. Lean cuts of beef with fat trimmed off. Low-sodium, lean deli meat. Dairy Low-fat (1%) or fat-free (skim) milk. Fat-free, low-fat, or reduced-fat cheeses. Nonfat, low-sodium ricotta or cottage cheese. Low-fat or nonfat yogurt. Low-fat, low-sodium cheese. Fats and oils Soft margarine without trans fats. Vegetable oil. Low-fat, reduced-fat, or light mayonnaise and salad dressings (reduced-sodium). Canola, safflower, olive, soybean, and sunflower oils. Avocado. Seasoning and other foods Herbs. Spices. Seasoning mixes without salt. Unsalted popcorn and pretzels. Fat-free sweets. What foods are not recommended? The items listed may not be a complete list. Talk with your dietitian about what dietary choices are best for you. Grains Baked goods made with fat, such as croissants, muffins, or some breads. Dry pasta or rice meal packs. Vegetables Creamed or fried vegetables. Vegetables in a cheese sauce. Regular canned vegetables (not low-sodium or reduced-sodium). Regular canned tomato sauce and paste (not low-sodium or reduced-sodium). Regular tomato and vegetable juice (not low-sodium or reduced-sodium). Angie Fava. Olives. Fruits Canned fruit in a light or heavy syrup. Fried fruit. Fruit in cream or butter sauce. Meat and other protein foods Fatty cuts of meat. Ribs. Fried meat. Berniece Salines. Sausage. Bologna and other processed lunch meats. Salami. Fatback. Hotdogs. Bratwurst. Salted nuts and seeds. Canned beans with added salt. Canned or smoked fish. Whole eggs or egg yolks. Chicken or [place] with skin. Dairy Whole or 2% milk, cream, and half-and-half.  Whole or full-fat cream cheese. Whole-fat or sweetened yogurt. Full-fat cheese. Nondairy creamers. Whipped toppings. Processed cheese and cheese spreads. Fats and oils Butter. Stick margarine. Lard. Shortening. Ghee. Bacon fat. Tropical oils, such as coconut, palm kernel, or palm oil. Seasoning and other foods Salted popcorn and pretzels. Onion salt, garlic salt, seasoned salt, table salt, and sea salt. Worcestershire sauce. Tartar sauce. Barbecue sauce. Teriyaki sauce. Soy sauce, including reduced-sodium. Steak sauce. Canned and packaged gravies. Fish sauce. Oyster sauce. Cocktail sauce. Horseradish that you find on the shelf. Ketchup. Mustard. Meat flavorings and tenderizers. Bouillon cubes. Hot sauce and Tabasco sauce. Premade or packaged marinades. Premade or packaged taco seasonings. Relishes. Regular salad dressings. Where to find more information:  National Heart, Lung, and [place]: https://wilson-eaton.com/  American Heart Association: www.heart.org Summary  The DASH eating plan is a healthy eating plan that has been shown to reduce high blood pressure (hypertension). It may also  reduce your risk for type 2 diabetes, heart disease, and stroke.  With the DASH eating plan, you should limit salt (sodium) intake to 2,300 mg a day. If you have hypertension, you may need to reduce your sodium intake to 1,500 mg a day.  When on the DASH eating plan, aim to eat more fresh fruits and vegetables, whole grains, lean proteins, low-fat dairy, and heart-healthy fats.  Work with your health care provider or diet and nutrition specialist (dietitian) to adjust your eating plan to your individual calorie needs. This information is not intended to replace advice given to you by your health care provider. Make sure you discuss any questions you have with your health care provider. Document Released: 07/26/2011 Document Revised: 07/30/2016 Document Reviewed: 07/30/2016 Elsevier Interactive Patient Education   2018 [place].    Sulfamethoxazole; Trimethoprim, SMX-TMP tablets What is this medicine? SULFAMETHOXAZOLE; TRIMETHOPRIM or SMX-TMP (suhl fuh meth OK suh zohl; trye METH oh prim) is a combination of a sulfonamide antibiotic and a second antibiotic, trimethoprim. It is used to treat or prevent certain kinds of bacterial infections. It will not work for colds, flu, or other viral infections. This medicine may be used for other purposes; ask your health care provider or pharmacist if you have questions. COMMON BRAND NAME(S): Bacter-Aid DS, Bactrim, Bactrim DS, Septra, Septra DS What should I tell my health care provider before I take this medicine? They need to know if you have any of these conditions: -anemia -asthma -being treated with anticonvulsants -if you frequently drink alcohol containing drinks -kidney disease -liver disease -low level of folic acid or [id number] dehydrogenase -poor nutrition or malabsorption -porphyria -severe allergies -thyroid disorder -an unusual or allergic reaction to sulfamethoxazole, trimethoprim, sulfa drugs, other medicines, foods, dyes, or preservatives -pregnant or trying to get pregnant -breast-feeding How should I use this medicine? Take this medicine by mouth with a full glass of water. Follow the directions on the prescription label. Take your medicine at regular intervals. Do not take it more often than directed. Do not skip doses or stop your medicine early. Talk to your pediatrician regarding the use of this medicine in children. Special care may be needed. This medicine has been used in children as young as 71 months of age. Overdosage: If you think you have taken too much of this medicine contact a poison control center or emergency room at once. NOTE: This medicine is only for you. Do not share this medicine with others. What if I miss a dose? If you miss a dose, take it as soon as you can. If it is almost time for your next  dose, take only that dose. Do not take double or extra doses. What may interact with this medicine? Do not take this medicine with any of the following medications: -aminobenzoate potassium -dofetilide -metronidazole This medicine may also interact with the following medications: -ACE inhibitors like benazepril, enalapril, lisinopril, and ramipril -birth control pills -cyclosporine -digoxin -diuretics -indomethacin -medicines for diabetes -methenamine -methotrexate -phenytoin -potassium supplements -pyrimethamine -sulfinpyrazone -tricyclic antidepressants -warfarin This list may not describe all possible interactions. Give your health care provider a list of all the medicines, herbs, non-prescription drugs, or dietary supplements you use. Also tell them if you smoke, drink alcohol, or use illegal drugs. Some items may interact with your medicine. What should I watch for while using this medicine? Tell your doctor or health care professional if your symptoms do not improve. Drink several glasses of water a day to reduce the risk  of kidney problems. Do not treat diarrhea with over the counter products. Contact your doctor if you have diarrhea that lasts more than 2 days or if it is severe and watery. This medicine can make you more sensitive to the sun. Keep out of the sun. If you cannot avoid being in the sun, wear protective clothing and use a sunscreen. Do not use sun lamps or tanning beds/booths. What side effects may I notice from receiving this medicine? Side effects that you should report to your doctor or health care professional as soon as possible: -allergic reactions like skin rash or hives, swelling of the face, lips, or tongue -breathing problems -fever or chills, sore throat -irregular heartbeat, chest pain -joint or muscle pain -pain or difficulty passing urine -red pinpoint spots on skin -redness, blistering, peeling or loosening of the skin, including inside the  mouth -unusual bleeding or bruising -unusually weak or tired -yellowing of the eyes or skin Side effects that usually do not require medical attention (report to your doctor or health care professional if they continue or are bothersome): -diarrhea -dizziness -headache -loss of appetite -nausea, vomiting -nervousness This list may not describe all possible side effects. Call your doctor for medical advice about side effects. You may report side effects to FDA at 1-800-FDA-1088. Where should I keep my medicine? Keep out of the reach of children. Store at room temperature between 20 to 25 degrees C (68 to 77 degrees F). Protect from light. Throw away any unused medicine after the expiration date. NOTE: This sheet is a summary. It may not cover all possible information. If you have questions about this medicine, talk to your doctor, pharmacist, or health care provider.  2018 Elsevier/Gold Standard (2013-03-13 14:38:26)

## 2018-06-10 ENCOUNTER — Ambulatory Visit (HOSPITAL_COMMUNITY)
Admission: RE | Admit: 2018-06-10 | Discharge: 2018-06-10 | Disposition: A | Discharge status: Home / Self Care | Payer: No Typology Code available for payment source | Source: Ambulatory Visit | Attending: Family Medicine | Admitting: Family Medicine

## 2018-06-10 DIAGNOSIS — R600 Localized edema: Secondary | ICD-10-CM | POA: Insufficient documentation

## 2018-06-10 NOTE — Progress Notes (Signed)
Left lower extremity venous has been completed. Negative for DVT. Results were given to Kathe Becton FNP.  06/10/18 10:06 AM Carlos Levering RVT

## 2018-06-11 LAB — URINE CULTURE

## 2018-06-18 MED FILL — HYDROCORTISONE 2.5% CREAM: 2.5 | 15 days supply | Qty: 30 | Fill #0

## 2018-06-18 MED FILL — SULFAMETHOXAZOLE-TMP DS TAB: 800-160 | 7 days supply | Qty: 14 | Fill #0

## 2018-06-20 ENCOUNTER — Other Ambulatory Visit: Payer: Self-pay | Admitting: Family Medicine

## 2018-06-20 DIAGNOSIS — J4520 Mild intermittent asthma, uncomplicated: Secondary | ICD-10-CM

## 2018-06-20 MED FILL — LISINOPRIL-HCTZ 20-25 MG TA: 20-25 | 30 days supply | Qty: 30 | Fill #1

## 2018-06-20 MED FILL — ALBUTEROL SULFATE HFA 108 (: 108 (90 BAS | 25 days supply | Qty: 18 | Fill #0

## 2018-07-28 ENCOUNTER — Ambulatory Visit: Payer: Self-pay | Admitting: Family Medicine

## 2018-08-04 ENCOUNTER — Ambulatory Visit: Payer: Self-pay | Admitting: Family Medicine

## 2018-09-03 MED FILL — LISINOPRIL-HCTZ 20-25 MG TA: 20-25 | 30 days supply | Qty: 30 | Fill #2

## 2018-09-03 MED FILL — hydrOXYzine HCL 25 MG TABS: 25 | 7 days supply | Qty: 30 | Fill #1

## 2018-09-03 MED FILL — IBUPROFEN 800 MG TABLET: 800 | 10 days supply | Qty: 30 | Fill #2

## 2018-10-06 ENCOUNTER — Ambulatory Visit: Payer: Self-pay | Admitting: Family Medicine

## 2018-10-13 ENCOUNTER — Ambulatory Visit: Payer: Self-pay | Admitting: Family Medicine

## 2018-10-20 MED FILL — IBUPROFEN 800 MG TABLET: 800 | 10 days supply | Qty: 30 | Fill #3

## 2018-10-20 MED FILL — ?CETIRIZINE HCL 10 MG TABLE: 10 | 30 days supply | Qty: 30 | Fill #0

## 2018-10-20 MED FILL — LISINOPRIL-HCTZ 20-25 MG TA: 20-25 | 30 days supply | Qty: 30 | Fill #3

## 2018-10-23 MED FILL — ALBUTEROL SULFATE HFA 108 (: 108 (90 BAS | 25 days supply | Qty: 18 | Fill #1

## 2018-10-28 ENCOUNTER — Ambulatory Visit: Payer: Self-pay | Admitting: Family Medicine

## 2018-10-31 ENCOUNTER — Encounter: Payer: Self-pay | Admitting: Family Medicine

## 2018-10-31 ENCOUNTER — Ambulatory Visit (INDEPENDENT_AMBULATORY_CARE_PROVIDER_SITE_OTHER): Payer: Self-pay | Admitting: Family Medicine

## 2018-10-31 ENCOUNTER — Other Ambulatory Visit: Payer: Self-pay

## 2018-10-31 VITALS — BP 160/94 | HR 86 | Temp 97.7°F | Ht 60.0 in | Wt 248.0 lb

## 2018-10-31 DIAGNOSIS — Z09 Encounter for follow-up examination after completed treatment for conditions other than malignant neoplasm: Secondary | ICD-10-CM

## 2018-10-31 DIAGNOSIS — I10 Essential (primary) hypertension: Secondary | ICD-10-CM

## 2018-10-31 DIAGNOSIS — R8290 Unspecified abnormal findings in urine: Secondary | ICD-10-CM | POA: Diagnosis not present

## 2018-10-31 DIAGNOSIS — R600 Localized edema: Secondary | ICD-10-CM

## 2018-10-31 DIAGNOSIS — Z131 Encounter for screening for diabetes mellitus: Secondary | ICD-10-CM

## 2018-10-31 LAB — POCT URINALYSIS DIP (MANUAL ENTRY)
Bilirubin, UA: NEGATIVE
Glucose, UA: NEGATIVE mg/dL
Ketones, POC UA: NEGATIVE mg/dL
Leukocytes, UA: NEGATIVE
Nitrite, UA: NEGATIVE
Protein Ur, POC: NEGATIVE mg/dL
Spec Grav, UA: 1.025 (ref 1.010–1.025)
Urobilinogen, UA: 0.2 E.U./dL
pH, UA: 5.5 (ref 5.0–8.0)

## 2018-10-31 LAB — POCT GLYCOSYLATED HEMOGLOBIN (HGB A1C): Hemoglobin A1C: 5.4 % (ref 4.0–5.6)

## 2018-10-31 NOTE — Progress Notes (Signed)
Patient [facility]  Established Patient Office Visit  Subjective:  Patient ID: Caroline Ryan, female    DOB: 19-Mar-1966  Age: 53 y.o. MRN: [id number]  CC:  Chief Complaint  Patient presents with  . Follow-up    chronic condition   . Fall    knee pain    HPI Tatyana Biber is a 53 year female who presents for follow up today.   Past Medical History:  Diagnosis Date  . Anemia   . Asthma   . Depression    no meds  . Eczema   . Fibroid   . Headache(784.0)    otc prn med  . History of blood transfusion   . Hypertension   . Knee pain, bilateral    arthralgia knee pain bilateral  . Seasonal allergies   . SVD (spontaneous vaginal delivery)    x 3   Current Status: Since her last office visit, she continues work on her diet and  increased her physical exercises. She has chronic bilateral knee pain. She denies visual changes, chest pain, cough, shortness of breath, heart palpitations, and falls. She has occasional headaches and dizziness with position changes. Denies severe headaches, confusion, seizures, double vision, and blurred vision, nausea and vomiting.  She denies fevers, chills, fatigue, recent infections, weight loss, and night sweats. She has not had visual changes, and falls. No chest pain, heart palpitations, cough and shortness of breath reported. No reports of GI problems such as diarrhea, and constipation. She has no reports of blood in stools, dysuria and hematuria. No depression or anxiety reported. She denies pain today.   Past Surgical History:  Procedure Laterality Date  . ABDOMINAL HYSTERECTOMY    . BILATERAL SALPINGECTOMY Bilateral 10/10/2017   Procedure: BILATERAL SALPINGECTOMY;  Surgeon: Chancy Milroy, MD;  Location: [facility];  Service: Gynecology;  Laterality: Bilateral;  . BREAST BIOPSY     US guided core 2009  . BREAST EXCISIONAL BIOPSY     right 1982 left 1981  . BREAST SURGERY     bilateral cysts/benign   . VAGINAL HYSTERECTOMY N/A 10/10/2017   Procedure: HYSTERECTOMY VAGINAL;  Surgeon: Chancy Milroy, MD;  Location: [facility];  Service: Gynecology;  Laterality: N/A;    Family History  Problem Relation Age of Onset  . Hypertension Father   . Diabetes Brother   . Diabetes Brother   . Breast cancer Mother 23  . Other Neg Hx     Social History   Socioeconomic History  . Marital status: Single    Spouse name: Not on file  . Number of children: Not on file  . Years of education: Not on file  . Highest education level: Not on file  Occupational History  . Not on file  Social Needs  . Financial resource strain: Not on file  . Food insecurity:    Worry: Not on file    Inability: Not on file  . Transportation needs:    Medical: Not on file    Non-medical: Not on file  Tobacco Use  . Smoking status: Former Smoker    Packs/day: 0.25    Years: 2.00    Pack years: 0.50    Types: Cigarettes    Last attempt to quit: 08/20/2014    Years since quitting: 4.2  . Smokeless tobacco: Never Used  Substance and Sexual Activity  . Alcohol use: Yes    Alcohol/week: 2.0 standard drinks    Types: 2 Glasses of  wine per week  . Drug use: No  . Sexual activity: Yes    Birth control/protection: Condom  Lifestyle  . Physical activity:    Days per week: Not on file    Minutes per session: Not on file  . Stress: Not on file  Relationships  . Social connections:    Talks on phone: Not on file    Gets together: Not on file    Attends religious service: Not on file    Active member of club or organization: Not on file    Attends meetings of clubs or organizations: Not on file    Relationship status: Not on file  . Intimate partner violence:    Fear of current or ex partner: Not on file    Emotionally abused: Not on file    Physically abused: Not on file    Forced sexual activity: Not on file  Other Topics Concern  . Not on file  Social History Narrative  . Not on file    Outpatient  Medications Prior to Visit  Medication Sig Dispense Refill  . cetirizine (ZYRTEC ALLERGY) 10 MG tablet Take 1 tablet (10 mg total) by mouth daily. 30 tablet 3  . diclofenac sodium (VOLTAREN) 1 % GEL Apply 2 g topically 4 (four) times daily. 2 g 3  . diphenhydrAMINE (BENADRYL) 25 MG tablet Take 1 tablet (25 mg total) by mouth every 6 (six) hours as needed for itching. 20 tablet 0  . docusate sodium (COLACE) 100 MG capsule Take 1 capsule (100 mg total) by mouth 2 (two) times daily as needed. 30 capsule 2  . hydrocortisone 2.5 % cream Apply topically 2 (two) times daily. 30 g 0  . hydrOXYzine (ATARAX/VISTARIL) 25 MG tablet TAKE 1 TABLET BY MOUTH EVERY 6 HOURS AS NEEDED FOR ITCHING. 30 tablet 3  . lisinopril-hydrochlorothiazide (PRINZIDE,ZESTORETIC) 20-25 MG tablet Take 1 tablet by mouth daily. 30 tablet 5  . montelukast (SINGULAIR) 10 MG tablet TAKE 1 TABLET (10 MG TOTAL) BY MOUTH AT BEDTIME. 30 tablet 3  . naproxen (NAPROSYN) 500 MG tablet TAKE 1 TABLET BY MOUTH 2 TIMES DAILY WITH A MEAL. 30 tablet 0  . VENTOLIN HFA 108 (90 Base) MCG/ACT inhaler INHALE 2 PUFFS INTO THE LUNGS EVERY 6 HOURS AS NEEDED FOR WHEEZING OR SHORTNESS OF BREATH. ASTHMA ATTACKS 18 g 2  . furosemide (LASIX) 20 MG tablet Take 1 tablet (20 mg total) by mouth daily. 30 tablet 1  . gabapentin (NEURONTIN) 300 MG capsule Take 1 capsule (300 mg total) by mouth 3 (three) times daily. (Patient not taking: Reported on 10/31/2018) 90 capsule 3  . meloxicam (MOBIC) 15 MG tablet TAKE 1 TABLET BY MOUTH DAILY. (Patient not taking: Reported on 10/31/2018) 30 tablet 0  . famotidine (PEPCID) 20 MG tablet Take 1 tablet (20 mg total) by mouth 2 (two) times daily as needed (itching). (Patient not taking: Reported on 05/09/2018) 20 tablet 0  . sulfamethoxazole-trimethoprim (BACTRIM DS,SEPTRA DS) 800-160 MG tablet Take 1 tablet by mouth 2 (two) times daily. 14 tablet 0   No facility-administered medications prior to visit.     No Known Allergies  ROS  Review of Systems  Constitutional: Negative.   HENT: Negative.   Eyes: Negative.   Respiratory: Negative.   Cardiovascular: Negative.   Gastrointestinal: Negative.   Endocrine: Negative.   Genitourinary: Negative.   Musculoskeletal:       Bilateral knee pain   Allergic/Immunologic: Negative.   Neurological: Positive for dizziness and headaches.  Hematological: Negative.   Psychiatric/Behavioral: Negative.    Objective:    Physical Exam  Constitutional: She is oriented to person, place, and time. She appears well-developed and well-nourished.  HENT:  Head: Normocephalic and atraumatic.  Eyes: Conjunctivae are normal.  Neck: Normal range of motion. Neck supple.  Cardiovascular: Normal rate, regular rhythm, normal heart sounds and intact distal pulses.  Pulmonary/Chest: Effort normal and breath sounds normal.  Abdominal: Soft. Bowel sounds are normal.  Musculoskeletal: Normal range of motion.  Neurological: She is alert and oriented to person, place, and time. She has normal reflexes.  Skin: Skin is warm and dry.  Psychiatric: She has a normal mood and affect. Her behavior is normal. Judgment and thought content normal.  Nursing note and vitals reviewed.   BP (!) 160/94 (BP Location: Left Wrist, Patient Position: Sitting, Cuff Size: Large)   Pulse 86   Temp 97.7 F (36.5 C) (Oral)   Ht 5' (1.524 m)   Wt 248 lb (112.5 kg)   LMP 04/03/2017 (Approximate)   SpO2 100%   BMI 48.43 kg/m  Wt Readings from Last 3 Encounters:  10/31/18 248 lb (112.5 kg)  06/09/18 248 lb (112.5 kg)  05/09/18 245 lb (111.1 kg)     Health Maintenance Due  Topic Date Due  . COLONOSCOPY  02/07/2014    There are no preventive care reminders to display for this patient.  Lab Results  Component Value Date   TSH 1.450 11/15/2017   Lab Results  Component Value Date   WBC 9.6 10/11/2017   HGB 8.8 (L) 10/11/2017   HCT 27.2 (L) 10/11/2017   MCV 91.9 10/11/2017   PLT 158 10/11/2017   Lab  Results  Component Value Date   NA 139 05/09/2018   K 3.8 05/09/2018   CO2 26 05/09/2018   GLUCOSE 89 05/09/2018   BUN 14 05/09/2018   CREATININE 0.60 05/09/2018   BILITOT 0.4 05/09/2018   ALKPHOS 102 05/09/2018   AST 14 05/09/2018   ALT 15 05/09/2018   PROT 7.4 05/09/2018   ALBUMIN 4.4 05/09/2018   CALCIUM 10.1 05/09/2018   ANIONGAP 7 10/11/2017   Lab Results  Component Value Date   CHOL 179 11/15/2017   Lab Results  Component Value Date   HDL 73 11/15/2017   Lab Results  Component Value Date   LDLCALC 89 11/15/2017   Lab Results  Component Value Date   TRIG 83 11/15/2017   Lab Results  Component Value Date   CHOLHDL 2.5 11/15/2017   Lab Results  Component Value Date   HGBA1C 5.4 10/31/2018   Assessment & Plan:   1. Essential hypertension Blood pressure elevated today. Continue Lisinopril-HCTZ as prescribed. She will continue to decrease high sodium intake, excessive alcohol intake, increase potassium intake, smoking cessation, and increase physical activity of at least 30 minutes of cardio activity daily. She will continue to follow Heart Healthy or DASH diet.  2. Bilateral lower extremity edema 1+ bilateral edema. Continue Lasix as prescribed.   3. Screening for diabetes mellitus Hgb A1c is within normal level.  She will continue to decrease foods/beverages high in sugars and carbs and follow Heart Healthy or DASH diet. Increase physical activity to at least 30 minutes cardio exercise daily.  - POCT glycosylated hemoglobin (Hb A1C)  4. Abnormal urinalysis Results are pending.  - Urine Culture  5. Follow up She will follow up in 1 month.  - POCT urinalysis dipstick  No orders of the defined types were placed  in this encounter.   Orders Placed This Encounter  Procedures  . Urine Culture  . POCT urinalysis dipstick  . POCT glycosylated hemoglobin (Hb A1C)    Referral Orders  No referral(s) requested today    Kathe Becton,  MSN, FNP-C  Patient [place], [place] [address] [phone]  Problem List Items Addressed This Visit    None    Visit Diagnoses    Essential hypertension    -  Primary   Bilateral lower extremity edema       Screening for diabetes mellitus       Relevant Orders   POCT glycosylated hemoglobin (Hb A1C) (Completed)   Abnormal urinalysis       Relevant Orders   Urine Culture (Completed)   Follow up       Relevant Orders   POCT urinalysis dipstick (Completed)      No orders of the defined types were placed in this encounter.   Follow-up: Return in about 1 month (around 12/01/2018).    [name], FNP

## 2018-10-31 NOTE — Patient Instructions (Addendum)

## 2018-11-02 DIAGNOSIS — R600 Localized edema: Secondary | ICD-10-CM | POA: Insufficient documentation

## 2018-11-02 LAB — URINE CULTURE

## 2018-11-13 MED FILL — IBUPROFEN 800 MG TABLET: 800 | 10 days supply | Qty: 30 | Fill #4

## 2018-11-13 MED FILL — LISINOPRIL-HCTZ 20-25 MG TA: 20-25 | 30 days supply | Qty: 30 | Fill #4

## 2018-11-13 MED FILL — hydrOXYzine HCL 25 MG TABS: 25 | 7 days supply | Qty: 30 | Fill #2

## 2018-11-24 MED FILL — !VENTOLIN HFA INHALER: 108 (90 BAS | 25 days supply | Qty: 18 | Fill #2

## 2018-11-24 MED FILL — ?CETIRIZINE HCL 10 MG TABLE: 10 | 30 days supply | Qty: 30 | Fill #1

## 2018-12-05 ENCOUNTER — Other Ambulatory Visit: Payer: Self-pay

## 2018-12-05 ENCOUNTER — Ambulatory Visit (INDEPENDENT_AMBULATORY_CARE_PROVIDER_SITE_OTHER): Payer: Medicaid Other | Admitting: Family Medicine

## 2018-12-05 DIAGNOSIS — M25561 Pain in right knee: Secondary | ICD-10-CM | POA: Diagnosis not present

## 2018-12-05 DIAGNOSIS — Z09 Encounter for follow-up examination after completed treatment for conditions other than malignant neoplasm: Secondary | ICD-10-CM

## 2018-12-05 DIAGNOSIS — L299 Pruritus, unspecified: Secondary | ICD-10-CM | POA: Diagnosis not present

## 2018-12-05 DIAGNOSIS — J4520 Mild intermittent asthma, uncomplicated: Secondary | ICD-10-CM | POA: Diagnosis not present

## 2018-12-05 DIAGNOSIS — R600 Localized edema: Secondary | ICD-10-CM

## 2018-12-05 DIAGNOSIS — G8929 Other chronic pain: Secondary | ICD-10-CM | POA: Diagnosis not present

## 2018-12-05 DIAGNOSIS — M25562 Pain in left knee: Secondary | ICD-10-CM | POA: Diagnosis not present

## 2018-12-05 DIAGNOSIS — I10 Essential (primary) hypertension: Secondary | ICD-10-CM

## 2018-12-05 MED ORDER — LISINOPRIL-HYDROCHLOROTHIAZIDE 20-25 MG PO TABS: 1.0000 | ORAL_TABLET | Freq: Every day | ORAL | 6 refills | Status: DC

## 2018-12-05 MED ORDER — MONTELUKAST SODIUM 10 MG PO TABS: 10.0000 mg | ORAL_TABLET | Freq: Every day | ORAL | 6 refills | Status: DC

## 2018-12-05 MED ORDER — GABAPENTIN 300 MG PO CAPS: 300.0000 mg | ORAL_CAPSULE | Freq: Three times a day (TID) | ORAL | 6 refills | Status: DC

## 2018-12-05 MED ORDER — FUROSEMIDE 20 MG PO TABS: 20.0000 mg | ORAL_TABLET | Freq: Every day | ORAL | 6 refills | Status: DC

## 2018-12-05 MED ORDER — ALBUTEROL SULFATE HFA 108 (90 BASE) MCG/ACT IN AERS: INHALATION_SPRAY | RESPIRATORY_TRACT | 11 refills | Status: DC

## 2018-12-05 MED ORDER — DICLOFENAC SODIUM 75 MG PO TBEC: 75.0000 mg | DELAYED_RELEASE_TABLET | Freq: Two times a day (BID) | ORAL | 6 refills | Status: DC

## 2018-12-05 MED FILL — LISINOPRIL-HCTZ 20-25 MG TA: 20-25 | 30 days supply | Qty: 30 | Fill #0

## 2018-12-05 MED FILL — MONTELUKAST SOD 10 MG TAB: 10 | 30 days supply | Qty: 30 | Fill #0

## 2018-12-05 MED FILL — ?FUROSEMIDE 20 MG TABLET: 20 | 30 days supply | Qty: 30 | Fill #0

## 2018-12-05 MED FILL — GABAPENTIN 300 MG CAPSULE: 300 | 30 days supply | Qty: 90 | Fill #0

## 2018-12-05 MED FILL — DICLOFENAC SOD EC 75 MG TAB: 75 | 30 days supply | Qty: 60 | Fill #0

## 2018-12-05 NOTE — Progress Notes (Signed)
Virtual Visit via Telephone Note  I connected with Caroline Ryan on 12/05/18 at  2:00 PM EDT by telephone and verified that I am speaking with the correct person using two identifiers.   I discussed the limitations, risks, security and privacy concerns of performing an evaluation and management service by telephone and the availability of in person appointments. I also discussed with the patient that there may be a patient responsible charge related to this service. The patient expressed understanding and agreed to proceed.   History of Present Illness:  Past Medical History:  Diagnosis Date  . Anemia   . Asthma   . Depression    no meds  . Eczema   . Fibroid   . Headache(784.0)    otc prn med  . History of blood transfusion   . Hypertension   . Knee pain, bilateral    arthralgia knee pain bilateral  . Seasonal allergies   . SVD (spontaneous vaginal delivery)    x 3    Current Outpatient Medications on File Prior to Visit  Medication Sig Dispense Refill  . cetirizine (ZYRTEC ALLERGY) 10 MG tablet Take 1 tablet (10 mg total) by mouth daily. 30 tablet 3  . diclofenac sodium (VOLTAREN) 1 % GEL Apply 2 g topically 4 (four) times daily. 2 g 3  . diphenhydrAMINE (BENADRYL) 25 MG tablet Take 1 tablet (25 mg total) by mouth every 6 (six) hours as needed for itching. 20 tablet 0  . docusate sodium (COLACE) 100 MG capsule Take 1 capsule (100 mg total) by mouth 2 (two) times daily as needed. 30 capsule 2  . furosemide (LASIX) 20 MG tablet Take 1 tablet (20 mg total) by mouth daily. 30 tablet 1  . gabapentin (NEURONTIN) 300 MG capsule Take 1 capsule (300 mg total) by mouth 3 (three) times daily. (Patient not taking: Reported on 10/31/2018) 90 capsule 3  . hydrocortisone 2.5 % cream Apply topically 2 (two) times daily. 30 g 0  . hydrOXYzine (ATARAX/VISTARIL) 25 MG tablet TAKE 1 TABLET BY MOUTH EVERY 6 HOURS AS NEEDED FOR ITCHING. 30 tablet 3  . lisinopril-hydrochlorothiazide  (PRINZIDE,ZESTORETIC) 20-25 MG tablet Take 1 tablet by mouth daily. 30 tablet 5  . meloxicam (MOBIC) 15 MG tablet TAKE 1 TABLET BY MOUTH DAILY. (Patient not taking: Reported on 10/31/2018) 30 tablet 0  . montelukast (SINGULAIR) 10 MG tablet TAKE 1 TABLET (10 MG TOTAL) BY MOUTH AT BEDTIME. 30 tablet 3  . naproxen (NAPROSYN) 500 MG tablet TAKE 1 TABLET BY MOUTH 2 TIMES DAILY WITH A MEAL. 30 tablet 0  . VENTOLIN HFA 108 (90 Base) MCG/ACT inhaler INHALE 2 PUFFS INTO THE LUNGS EVERY 6 HOURS AS NEEDED FOR WHEEZING OR SHORTNESS OF BREATH. ASTHMA ATTACKS 18 g 2   No current facility-administered medications on file prior to visit.    Current Status: Since her last office visit, she is doing well with no complaints. She denies visual changes, chest pain, cough, shortness of breath, heart palpitations, and falls. She has occasional headaches and dizziness with position changes. Denies severe headaches, confusion, seizures, double vision, and blurred vision, nausea and vomiting. Her anxiety is mild today. She denies suicidal ideations, homicidal ideations, or auditory hallucinations.  She denies fevers, chills, fatigue, recent infections, weight loss, and night sweats. No reports of GI problems such as diarrhea, and constipation. She has no reports of blood in stools, dysuria and hematuria.   Observations/Objective:  Telephone Virtual Visit   Assessment and Plan:  1. Chronic pain  of right knee We will initiate Diclofenac tablets today, and discontinue gel form.  - diclofenac (VOLTAREN) 75 MG EC tablet; Take 1 tablet (75 mg total) by mouth 2 (two) times daily.  Dispense: 60 tablet; Refill: 6  2. Mild intermittent asthma, unspecified whether complicated Stable today. Continue medications as prescribed. She reports no respiratory distress.  - albuterol (VENTOLIN HFA) 108 (90 Base) MCG/ACT inhaler; INHALE 2 PUFFS INTO THE LUNGS EVERY 6 HOURS AS NEEDED FOR WHEEZING OR SHORTNESS OF BREATH. ASTHMA ATTACKS   Dispense: 18 g; Refill: 11 - montelukast (SINGULAIR) 10 MG tablet; Take 1 tablet (10 mg total) by mouth at bedtime.  Dispense: 30 tablet; Refill: 6  3. HYPERTENSION, BENIGN ESSENTIAL She will continue to decrease high sodium intake, excessive alcohol intake, increase potassium intake, smoking cessation, and increase physical activity of at least 30 minutes of cardio activity daily. She will continue to follow Heart Healthy or DASH diet. - lisinopril-hydrochlorothiazide (ZESTORETIC) 20-25 MG tablet; Take 1 tablet by mouth daily.  Dispense: 30 tablet; Refill: 6  4. Bilateral lower extremity edema - furosemide (LASIX) 20 MG tablet; Take 1 tablet (20 mg total) by mouth daily.  Dispense: 30 tablet; Refill: 6  5. Bilateral chronic knee pain - gabapentin (NEURONTIN) 300 MG capsule; Take 1 capsule (300 mg total) by mouth 3 (three) times daily.  Dispense: 90 capsule; Refill: 6  6. Pruritus Continue Hydroxyzine as prescribed.    Meds ordered this encounter  Medications  . diclofenac (VOLTAREN) 75 MG EC tablet    Sig: Take 1 tablet (75 mg total) by mouth 2 (two) times daily.    Dispense:  60 tablet    Refill:  6  . furosemide (LASIX) 20 MG tablet    Sig: Take 1 tablet (20 mg total) by mouth daily.    Dispense:  30 tablet    Refill:  6  . gabapentin (NEURONTIN) 300 MG capsule    Sig: Take 1 capsule (300 mg total) by mouth 3 (three) times daily.    Dispense:  90 capsule    Refill:  6  . lisinopril-hydrochlorothiazide (ZESTORETIC) 20-25 MG tablet    Sig: Take 1 tablet by mouth daily.    Dispense:  30 tablet    Refill:  6  . albuterol (VENTOLIN HFA) 108 (90 Base) MCG/ACT inhaler    Sig: INHALE 2 PUFFS INTO THE LUNGS EVERY 6 HOURS AS NEEDED FOR WHEEZING OR SHORTNESS OF BREATH. ASTHMA ATTACKS    Dispense:  18 g    Refill:  11  . montelukast (SINGULAIR) 10 MG tablet    Sig: Take 1 tablet (10 mg total) by mouth at bedtime.    Dispense:  30 tablet    Refill:  6    No orders of the defined  types were placed in this encounter.   Referral Orders  No referral(s) requested today    Kathe Becton,  MSN, FNP-C [facility] [address] [place], [place] [address] [phone]   Follow Up Instructions:  She will follow up in 6 months.    I discussed the assessment and treatment plan with the patient. The patient was provided an opportunity to ask questions and all were answered. The patient agreed with the plan and demonstrated an understanding of the instructions.   The patient was advised to call back or seek an in-person evaluation if the symptoms worsen or if the condition fails to improve as anticipated.  I provided 15-20 minutes of  non-face-to-face time during this encounter.   [name], FNP

## 2018-12-16 NOTE — Telephone Encounter (Signed)
Message sent to provider 

## 2018-12-24 ENCOUNTER — Ambulatory Visit (INDEPENDENT_AMBULATORY_CARE_PROVIDER_SITE_OTHER): Payer: Medicaid Other | Admitting: Family Medicine

## 2018-12-24 ENCOUNTER — Other Ambulatory Visit: Payer: Self-pay

## 2018-12-24 ENCOUNTER — Encounter: Payer: Self-pay | Admitting: Family Medicine

## 2018-12-24 VITALS — BP 150/92 | HR 94 | Temp 97.8°F | Ht 60.0 in | Wt 252.0 lb

## 2018-12-24 DIAGNOSIS — R319 Hematuria, unspecified: Secondary | ICD-10-CM

## 2018-12-24 DIAGNOSIS — N390 Urinary tract infection, site not specified: Secondary | ICD-10-CM

## 2018-12-24 DIAGNOSIS — R8290 Unspecified abnormal findings in urine: Secondary | ICD-10-CM

## 2018-12-24 DIAGNOSIS — N898 Other specified noninflammatory disorders of vagina: Secondary | ICD-10-CM | POA: Diagnosis not present

## 2018-12-24 DIAGNOSIS — Z0000 Encounter for general adult medical examination without abnormal findings: Secondary | ICD-10-CM

## 2018-12-24 DIAGNOSIS — Z09 Encounter for follow-up examination after completed treatment for conditions other than malignant neoplasm: Secondary | ICD-10-CM | POA: Diagnosis not present

## 2018-12-24 LAB — POCT GLYCOSYLATED HEMOGLOBIN (HGB A1C): Hemoglobin A1C: 5.1 % (ref 4.0–5.6)

## 2018-12-24 LAB — POCT URINALYSIS DIP (MANUAL ENTRY)
Bilirubin, UA: NEGATIVE
Glucose, UA: NEGATIVE mg/dL
Ketones, POC UA: NEGATIVE mg/dL
Nitrite, UA: NEGATIVE
Protein Ur, POC: NEGATIVE mg/dL
Spec Grav, UA: 1.025 (ref 1.010–1.025)
Urobilinogen, UA: 0.2 E.U./dL
pH, UA: 6 (ref 5.0–8.0)

## 2018-12-24 MED ORDER — SULFAMETHOXAZOLE-TRIMETHOPRIM 800-160 MG PO TABS: 1.0000 | ORAL_TABLET | Freq: Two times a day (BID) | ORAL | 0 refills | Status: DC

## 2018-12-24 MED FILL — SULFAMETHOXAZOLE-TMP DS TAB: 800-160 | 7 days supply | Qty: 14 | Fill #0

## 2018-12-24 NOTE — Patient Instructions (Addendum)
Sulfamethoxazole; Trimethoprim, SMX-TMP tablets What is this medicine? SULFAMETHOXAZOLE; TRIMETHOPRIM or SMX-TMP (suhl fuh meth OK suh zohl; trye METH oh prim) is a combination of a sulfonamide antibiotic and a second antibiotic, trimethoprim. It is used to treat or prevent certain kinds of bacterial infections. It will not work for colds, flu, or other viral infections. This medicine may be used for other purposes; ask your health care provider or pharmacist if you have questions. COMMON BRAND NAME(S): Bacter-Aid DS, Bactrim, Bactrim DS, Septra, Septra DS What should I tell my health care provider before I take this medicine? They need to know if you have any of these conditions: -anemia -asthma -being treated with anticonvulsants -if you frequently drink alcohol containing drinks -kidney disease -liver disease -low level of folic acid or glucose-6-phosphate dehydrogenase -poor nutrition or malabsorption -porphyria -severe allergies -thyroid disorder -an unusual or allergic reaction to sulfamethoxazole, trimethoprim, sulfa drugs, other medicines, foods, dyes, or preservatives -pregnant or trying to get pregnant -breast-feeding How should I use this medicine? Take this medicine by mouth with a full glass of water. Follow the directions on the prescription label. Take your medicine at regular intervals. Do not take it more often than directed. Do not skip doses or stop your medicine early. Talk to your pediatrician regarding the use of this medicine in children. Special care may be needed. This medicine has been used in children as young as 2 months of age. Overdosage: If you think you have taken too much of this medicine contact a poison control center or emergency room at once. NOTE: This medicine is only for you. Do not share this medicine with others. What if I miss a dose? If you miss a dose, take it as soon as you can. If it is almost time for your next dose, take only that dose. Do  not take double or extra doses. What may interact with this medicine? Do not take this medicine with any of the following medications: -aminobenzoate potassium -dofetilide -metronidazole This medicine may also interact with the following medications: -ACE inhibitors like benazepril, enalapril, lisinopril, and ramipril -birth control pills -cyclosporine -digoxin -diuretics -indomethacin -medicines for diabetes -methenamine -methotrexate -phenytoin -potassium supplements -pyrimethamine -sulfinpyrazone -tricyclic antidepressants -warfarin This list may not describe all possible interactions. Give your health care provider a list of all the medicines, herbs, non-prescription drugs, or dietary supplements you use. Also tell them if you smoke, drink alcohol, or use illegal drugs. Some items may interact with your medicine. What should I watch for while using this medicine? Tell your doctor or health care professional if your symptoms do not improve. Drink several glasses of water a day to reduce the risk of kidney problems. Do not treat diarrhea with over the counter products. Contact your doctor if you have diarrhea that lasts more than 2 days or if it is severe and watery. This medicine can make you more sensitive to the sun. Keep out of the sun. If you cannot avoid being in the sun, wear protective clothing and use a sunscreen. Do not use sun lamps or tanning beds/booths. What side effects may I notice from receiving this medicine? Side effects that you should report to your doctor or health care professional as soon as possible: -allergic reactions like skin rash or hives, swelling of the face, lips, or tongue -breathing problems -fever or chills, sore throat -irregular heartbeat, chest pain -joint or muscle pain -pain or difficulty passing urine -red pinpoint spots on skin -redness, blistering, peeling or loosening of   the skin, including inside the mouth -unusual bleeding or  bruising -unusually weak or tired -yellowing of the eyes or skin Side effects that usually do not require medical attention (report to your doctor or health care professional if they continue or are bothersome): -diarrhea -dizziness -headache -loss of appetite -nausea, vomiting -nervousness This list may not describe all possible side effects. Call your doctor for medical advice about side effects. You may report side effects to FDA at 1-800-FDA-1088. Where should I keep my medicine? Keep out of the reach of children. Store at room temperature between 20 to 25 degrees C (68 to 77 degrees F). Protect from light. Throw away any unused medicine after the expiration date. NOTE: This sheet is a summary. It may not cover all possible information. If you have questions about this medicine, talk to your doctor, pharmacist, or health care provider.  2019 Elsevier/Gold Standard (2013-03-13 14:38:26)     Urinary Tract Infection, Adult A urinary tract infection (UTI) is an infection of any part of the urinary tract. The urinary tract includes:  The kidneys.  The ureters.  The bladder.  The urethra. These organs make, store, and get rid of pee (urine) in the body. What are the causes? This is caused by germs (bacteria) in your genital area. These germs grow and cause swelling (inflammation) of your urinary tract. What increases the risk? You are more likely to develop this condition if:  You have a small, thin tube (catheter) to drain pee.  You cannot control when you pee or poop (incontinence).  You are female, and: ? You use these methods to prevent pregnancy: ? A medicine that kills sperm (spermicide). ? A device that blocks sperm (diaphragm). ? You have low levels of a female hormone (estrogen). ? You are pregnant.  You have genes that add to your risk.  You are sexually active.  You take antibiotic medicines.  You have trouble peeing because of: ? A prostate that is  bigger than normal, if you are female. ? A blockage in the part of your body that drains pee from the bladder (urethra). ? A kidney stone. ? A nerve condition that affects your bladder (neurogenic bladder). ? Not getting enough to drink. ? Not peeing often enough.  You have other conditions, such as: ? Diabetes. ? A weak disease-fighting system (immune system). ? Sickle cell disease. ? Gout. ? Injury of the spine. What are the signs or symptoms? Symptoms of this condition include:  Needing to pee right away (urgently).  Peeing often.  Peeing small amounts often.  Pain or burning when peeing.  Blood in the pee.  Pee that smells bad or not like normal.  Trouble peeing.  Pee that is cloudy.  Fluid coming from the vagina, if you are female.  Pain in the belly or lower back. Other symptoms include:  Throwing up (vomiting).  No urge to eat.  Feeling mixed up (confused).  Being tired and grouchy (irritable).  A fever.  Watery poop (diarrhea). How is this treated? This condition may be treated with:  Antibiotic medicine.  Other medicines.  Drinking enough water. Follow these instructions at home:  Medicines  Take over-the-counter and prescription medicines only as told by your doctor.  If you were prescribed an antibiotic medicine, take it as told by your doctor. Do not stop taking it even if you start to feel better. General instructions  Make sure you: ? Pee until your bladder is empty. ? Do not hold pee  for a long time. ? Empty your bladder after sex. ? Wipe from front to back after pooping if you are a female. Use each tissue one time when you wipe.  Drink enough fluid to keep your pee pale yellow.  Keep all follow-up visits as told by your doctor. This is important. Contact a doctor if:  You do not get better after 1-2 days.  Your symptoms go away and then come back. Get help right away if:  You have very bad back pain.  You have very bad  pain in your lower belly.  You have a fever.  You are sick to your stomach (nauseous).  You are throwing up. Summary  A urinary tract infection (UTI) is an infection of any part of the urinary tract.  This condition is caused by germs in your genital area.  There are many risk factors for a UTI. These include having a small, thin tube to drain pee and not being able to control when you pee or poop.  Treatment includes antibiotic medicines for germs.  Drink enough fluid to keep your pee pale yellow. This information is not intended to replace advice given to you by your health care provider. Make sure you discuss any questions you have with your health care provider. Document Released: 01/23/2008 Document Revised: 02/13/2018 Document Reviewed: 02/13/2018 Elsevier Interactive Patient Education  2019 [employer].     Palpitations Palpitations are feelings that your heartbeat is not normal. Your heartbeat may feel like it is:  Uneven.  Faster than normal.  Fluttering.  Skipping a beat. This is usually not a serious problem. In some cases, you may need tests to rule out any serious problems. Follow these instructions at home: Pay attention to any changes in your condition. Take these actions to help manage your symptoms: Eating and drinking  Avoid: ? Coffee, tea, soft drinks, and energy drinks. ? Chocolate. ? Alcohol. ? Diet pills. Lifestyle   Try to lower your stress. These things can help you relax: ? Yoga. ? Deep breathing and meditation. ? Exercise. ? Using words and images to create positive thoughts (guided imagery). ? Using your mind to control things in your body (biofeedback).  Do not use drugs.  Get plenty of rest and sleep. Keep a regular bed time. General instructions   Take over-the-counter and prescription medicines only as told by your doctor.  Do not use any products that contain nicotine or tobacco, such as cigarettes and e-cigarettes. If  you need help quitting, ask your doctor.  Keep all follow-up visits as told by your doctor. This is important. You may need more tests if palpitations do not go away or get worse. Contact a doctor if:  Your symptoms last more than 24 hours.  Your symptoms occur more often. Get help right away if you:  Have chest pain.  Feel short of breath.  Have a very bad headache.  Feel dizzy.  Pass out (faint). Summary  Palpitations are feelings that your heartbeat is uneven or faster than normal. It may feel like your heart is fluttering or skipping a beat.  Avoid food and drinks that may cause palpitations. These include caffeine, chocolate, and alcohol.  Try to lower your stress. Do not smoke or use drugs.  Get help right away if you faint or have chest pain, shortness of breath, a severe headache, or dizziness. This information is not intended to replace advice given to you by your health care provider. Make sure you discuss  any questions you have with your health care provider. Document Released: 05/15/2008 Document Revised: 09/18/2017 Document Reviewed: 09/18/2017 Elsevier Interactive Patient Education  2019 [employer].

## 2018-12-24 NOTE — Progress Notes (Signed)
Patient Caroline Ryan   Sick Visit  Subjective:  Patient ID: Caroline Ryan, female    DOB: [birth year]  Age: 53 y.o. MRN: [id number]  CC:  Chief Complaint  Patient presents with  . Vaginal Itching  . Vaginal Discharge    HPI Caroline Ryan is a 53 year old female who presents for Sick Visit today.   Past Medical History:  Diagnosis Date  . Anemia   . Asthma   . Depression    no meds  . Eczema   . Fibroid   . Headache(784.0)    otc prn med  . History of blood transfusion   . Hypertension   . Knee pain, bilateral    arthralgia knee pain bilateral  . Seasonal allergies   . SVD (spontaneous vaginal delivery)    x 3   Current Status: Since her last office visit, she is doing well with no complaints. She has c/o vaginal itching and discharge X 5 days. She not has used any medications to aide in relief. She denies urinary frequency, dysuria, burning, odor, hematuria, and suprapubic pain/discomfort. She continues to work on her weight loss goals. She walks for exercise daily.   She denies fevers, chills, fatigue, recent infections, weight loss, and night sweats. She has not had any headaches, visual changes, dizziness, and falls. No chest pain, heart palpitations, cough and shortness of breath reported. No reports of GI problems such as nausea, vomiting, diarrhea, and constipation. She has no reports of blood in stools. No depression or anxiety reported.  She denies pain today.   Past Surgical History:  Procedure Laterality Date  . ABDOMINAL HYSTERECTOMY    . BILATERAL SALPINGECTOMY Bilateral 10/10/2017   Procedure: BILATERAL SALPINGECTOMY;  Surgeon: Chancy Milroy, MD;  Location: [facility];  Service: Gynecology;  Laterality: Bilateral;  . BREAST BIOPSY     US guided core 2009  . BREAST EXCISIONAL BIOPSY     right 1982 left 1981  . BREAST SURGERY     bilateral cysts/benign  . VAGINAL HYSTERECTOMY N/A 10/10/2017   Procedure: HYSTERECTOMY  VAGINAL;  Surgeon: Chancy Milroy, MD;  Location: [facility];  Service: Gynecology;  Laterality: N/A;    Family History  Problem Relation Age of Onset  . Hypertension Father   . Diabetes Brother   . Diabetes Brother   . Breast cancer Mother 61  . Other Neg Hx     Social History   Socioeconomic History  . Marital status: Single    Spouse name: Not on file  . Number of children: Not on file  . Years of education: Not on file  . Highest education level: Not on file  Occupational History  . Not on file  Social Needs  . Financial resource strain: Not on file  . Food insecurity:    Worry: Not on file    Inability: Not on file  . Transportation needs:    Medical: Not on file    Non-medical: Not on file  Tobacco Use  . Smoking status: Former Smoker    Packs/day: 0.25    Years: 2.00    Pack years: 0.50    Types: Cigarettes    Last attempt to quit: 08/20/2014    Years since quitting: 4.3  . Smokeless tobacco: Never Used  Substance and Sexual Activity  . Alcohol use: Yes    Alcohol/week: 2.0 standard drinks    Types: 2 Glasses of wine per week  . Drug use:  No  . Sexual activity: Yes    Birth control/protection: Condom  Lifestyle  . Physical activity:    Days per week: Not on file    Minutes per session: Not on file  . Stress: Not on file  Relationships  . Social connections:    Talks on phone: Not on file    Gets together: Not on file    Attends religious service: Not on file    Active member of club or organization: Not on file    Attends meetings of clubs or organizations: Not on file    Relationship status: Not on file  . Intimate partner violence:    Fear of current or ex partner: Not on file    Emotionally abused: Not on file    Physically abused: Not on file    Forced sexual activity: Not on file  Other Topics Concern  . Not on file  Social History Narrative  . Not on file    Outpatient Medications Prior to Visit  Medication Sig Dispense Refill  .  albuterol (VENTOLIN HFA) 108 (90 Base) MCG/ACT inhaler INHALE 2 PUFFS INTO THE LUNGS EVERY 6 HOURS AS NEEDED FOR WHEEZING OR SHORTNESS OF BREATH. ASTHMA ATTACKS 18 g 11  . cetirizine (ZYRTEC ALLERGY) 10 MG tablet Take 1 tablet (10 mg total) by mouth daily. 30 tablet 3  . diclofenac (VOLTAREN) 75 MG EC tablet Take 1 tablet (75 mg total) by mouth 2 (two) times daily. 60 tablet 6  . diphenhydrAMINE (BENADRYL) 25 MG tablet Take 1 tablet (25 mg total) by mouth every 6 (six) hours as needed for itching. 20 tablet 0  . docusate sodium (COLACE) 100 MG capsule Take 1 capsule (100 mg total) by mouth 2 (two) times daily as needed. 30 capsule 2  . furosemide (LASIX) 20 MG tablet Take 1 tablet (20 mg total) by mouth daily. 30 tablet 6  . gabapentin (NEURONTIN) 300 MG capsule Take 1 capsule (300 mg total) by mouth 3 (three) times daily. 90 capsule 6  . hydrocortisone 2.5 % cream Apply topically 2 (two) times daily. 30 g 0  . hydrOXYzine (ATARAX/VISTARIL) 25 MG tablet TAKE 1 TABLET BY MOUTH EVERY 6 HOURS AS NEEDED FOR ITCHING. 30 tablet 3  . lisinopril-hydrochlorothiazide (ZESTORETIC) 20-25 MG tablet Take 1 tablet by mouth daily. 30 tablet 6  . montelukast (SINGULAIR) 10 MG tablet Take 1 tablet (10 mg total) by mouth at bedtime. 30 tablet 6   No facility-administered medications prior to visit.     No Known Allergies  ROS Review of Systems  Constitutional: Negative.   HENT: Negative.   Eyes: Negative.   Respiratory: Negative.   Cardiovascular: Negative.   Gastrointestinal: Positive for abdominal distention (obese).  Endocrine: Negative.   Genitourinary: Positive for vaginal discharge.  Musculoskeletal: Negative.   Skin: Negative.   Allergic/Immunologic: Negative.   Neurological: Negative.   Psychiatric/Behavioral: Negative.       Objective:    Physical Exam  BP (!) 150/92 (BP Location: Right Arm, Patient Position: Sitting, Cuff Size: Large)   Pulse 94   Temp 97.8 F (36.6 C) (Oral)   Ht  5' (1.524 m)   Wt 252 lb (114.3 kg)   LMP 04/03/2017 (Approximate)   SpO2 99%   BMI 49.22 kg/m  Wt Readings from Last 3 Encounters:  12/24/18 252 lb (114.3 kg)  10/31/18 248 lb (112.5 kg)  06/09/18 248 lb (112.5 kg)     Health Maintenance Due  Topic Date Due  .  COLONOSCOPY  02/07/2014    There are no preventive Ryan reminders to display for this patient.  Lab Results  Component Value Date   TSH 1.530 12/24/2018   Lab Results  Component Value Date   WBC 6.6 12/24/2018   HGB 11.4 12/24/2018   HCT 35.1 12/24/2018   MCV 94 12/24/2018   PLT 170 12/24/2018   Lab Results  Component Value Date   NA 142 12/24/2018   K 4.0 12/24/2018   CO2 26 12/24/2018   GLUCOSE 86 12/24/2018   BUN 20 12/24/2018   CREATININE 0.65 12/24/2018   BILITOT 0.3 12/24/2018   ALKPHOS 102 12/24/2018   AST 13 12/24/2018   ALT 17 12/24/2018   PROT 7.2 12/24/2018   ALBUMIN 4.4 12/24/2018   CALCIUM 10.2 12/24/2018   ANIONGAP 7 10/11/2017   Lab Results  Component Value Date   CHOL 180 12/24/2018   Lab Results  Component Value Date   HDL 72 12/24/2018   Lab Results  Component Value Date   LDLCALC 97 12/24/2018   Lab Results  Component Value Date   TRIG 57 12/24/2018   Lab Results  Component Value Date   CHOLHDL 2.5 12/24/2018   Lab Results  Component Value Date   HGBA1C 5.1 12/24/2018      Assessment & Plan:   1. Vaginal irritation Results are pending.  - POCT urinalysis dipstick - NuSwab Vaginitis Plus (VG+)  2. Urinary tract infection with hematuria, site unspecified We will initiate Septra today.  - sulfamethoxazole-trimethoprim (BACTRIM DS) 800-160 MG tablet; Take 1 tablet by mouth 2 (two) times daily.  Dispense: 14 tablet; Refill: 0  3. Healthcare maintenance - TSH - Lipid Panel - CBC with Differential - Comprehensive metabolic panel - Vitamin D, 25-hydroxy - Vitamin B12 - HgB A1c  4. Abnormal urinalysis Results are pending. - Urine Culture  5. Follow up  She will follow up in 6 months.  Meds ordered this encounter  Medications  . sulfamethoxazole-trimethoprim (BACTRIM DS) 800-160 MG tablet    Sig: Take 1 tablet by mouth 2 (two) times daily.    Dispense:  14 tablet    Refill:  0    Orders Placed This Encounter  Procedures  . Urine Culture  . NuSwab Vaginitis Plus (VG+)  . TSH  . Lipid Panel  . CBC with Differential  . Comprehensive metabolic panel  . Vitamin D, 25-hydroxy  . Vitamin B12  . POCT urinalysis dipstick  . HgB A1c    Referral Orders  No referral(s) requested today    Kathe Becton,  MSN, FNP-C Patient [address], [place] [address] [phone]   Problem List Items Addressed This Visit    None    Visit Diagnoses    Vaginal irritation    -  Primary   Relevant Orders   POCT urinalysis dipstick (Completed)   NuSwab Vaginitis Plus (VG+)   Urinary tract infection with hematuria, site unspecified       Relevant Medications   sulfamethoxazole-trimethoprim (BACTRIM DS) 800-160 MG tablet   Healthcare maintenance       Relevant Orders   TSH (Completed)   Lipid Panel (Completed)   CBC with Differential (Completed)   Comprehensive metabolic panel (Completed)   Vitamin D, 25-hydroxy (Completed)   Vitamin B12 (Completed)   HgB A1c (Completed)   Abnormal urinalysis       Relevant Orders   Urine Culture   Follow up  Meds ordered this encounter  Medications  . sulfamethoxazole-trimethoprim (BACTRIM DS) 800-160 MG tablet    Sig: Take 1 tablet by mouth 2 (two) times daily.    Dispense:  14 tablet    Refill:  0    Follow-up: Return in about 6 months (around 06/26/2019).    [name], FNP

## 2018-12-25 LAB — CBC WITH DIFFERENTIAL/PLATELET
Basophils Absolute: 0.1 10*3/uL (ref 0.0–0.2)
Basos: 1 %
EOS (ABSOLUTE): 0.1 10*3/uL (ref 0.0–0.4)
Eos: 2 %
Hematocrit: 35.1 % (ref 34.0–46.6)
Hemoglobin: 11.4 g/dL (ref 11.1–15.9)
Immature Grans (Abs): 0 10*3/uL (ref 0.0–0.1)
Immature Granulocytes: 0 %
Lymphocytes Absolute: 1.7 10*3/uL (ref 0.7–3.1)
Lymphs: 26 %
MCH: 30.5 pg (ref 26.6–33.0)
MCHC: 32.5 g/dL (ref 31.5–35.7)
MCV: 94 fL (ref 79–97)
Monocytes Absolute: 0.5 10*3/uL (ref 0.1–0.9)
Monocytes: 7 %
Neutrophils Absolute: 4.3 10*3/uL (ref 1.4–7.0)
Neutrophils: 64 %
Platelets: 170 10*3/uL (ref 150–450)
RBC: 3.74 x10E6/uL — ABNORMAL LOW (ref 3.77–5.28)
RDW: 12.4 % (ref 11.7–15.4)
WBC: 6.6 10*3/uL (ref 3.4–10.8)

## 2018-12-25 LAB — COMPREHENSIVE METABOLIC PANEL
ALT: 17 IU/L (ref 0–32)
AST: 13 IU/L (ref 0–40)
Albumin/Globulin Ratio: 1.6 (ref 1.2–2.2)
Albumin: 4.4 g/dL (ref 3.8–4.9)
Alkaline Phosphatase: 102 IU/L (ref 39–117)
BUN/Creatinine Ratio: 31 — ABNORMAL HIGH (ref 9–23)
BUN: 20 mg/dL (ref 6–24)
Bilirubin Total: 0.3 mg/dL (ref 0.0–1.2)
CO2: 26 mmol/L (ref 20–29)
Calcium: 10.2 mg/dL (ref 8.7–10.2)
Chloride: 100 mmol/L (ref 96–106)
Creatinine, Ser: 0.65 mg/dL (ref 0.57–1.00)
GFR calc Af Amer: 116 mL/min/{1.73_m2} (ref >59)
GFR calc non Af Amer: 101 mL/min/{1.73_m2} (ref >59)
Globulin, Total: 2.8 g/dL (ref 1.5–4.5)
Glucose: 86 mg/dL (ref 65–99)
Potassium: 4 mmol/L (ref 3.5–5.2)
Sodium: 142 mmol/L (ref 134–144)
Total Protein: 7.2 g/dL (ref 6.0–8.5)

## 2018-12-25 LAB — LIPID PANEL
Chol/HDL Ratio: 2.5 ratio (ref 0.0–4.4)
Cholesterol, Total: 180 mg/dL (ref 100–199)
HDL: 72 mg/dL (ref >39)
LDL Calculated: 97 mg/dL (ref 0–99)
Triglycerides: 57 mg/dL (ref 0–149)
VLDL Cholesterol Cal: 11 mg/dL (ref 5–40)

## 2018-12-25 LAB — VITAMIN B12: Vitamin B-12: 1825 pg/mL — ABNORMAL HIGH (ref 232–1245)

## 2018-12-25 LAB — VITAMIN D 25 HYDROXY (VIT D DEFICIENCY, FRACTURES): Vit D, 25-Hydroxy: 41.3 ng/mL (ref 30.0–100.0)

## 2018-12-25 LAB — TSH: TSH: 1.53 u[IU]/mL (ref 0.450–4.500)

## 2018-12-26 LAB — URINE CULTURE

## 2019-01-01 ENCOUNTER — Other Ambulatory Visit: Payer: Self-pay | Admitting: Family Medicine

## 2019-01-01 DIAGNOSIS — J4520 Mild intermittent asthma, uncomplicated: Secondary | ICD-10-CM

## 2019-01-01 DIAGNOSIS — L309 Dermatitis, unspecified: Secondary | ICD-10-CM

## 2019-01-01 MED FILL — PROAIR HFA 90 MCG INHALER: 108 (90 BAS | 25 days supply | Qty: 9 | Fill #0

## 2019-01-01 MED FILL — IBUPROFEN 800 MG TABLET: 800 | 10 days supply | Qty: 30 | Fill #5

## 2019-01-02 ENCOUNTER — Telehealth: Payer: Self-pay

## 2019-01-05 ENCOUNTER — Other Ambulatory Visit: Payer: Self-pay

## 2019-01-05 DIAGNOSIS — L309 Dermatitis, unspecified: Secondary | ICD-10-CM

## 2019-01-05 MED ORDER — HYDROCORTISONE 2.5 % EX CREA: TOPICAL_CREAM | Freq: Two times a day (BID) | CUTANEOUS | 0 refills | Status: DC

## 2019-01-05 NOTE — Telephone Encounter (Signed)
Medication sent to pharmacy  

## 2019-01-06 NOTE — Telephone Encounter (Signed)
ERROR

## 2019-01-10 LAB — NUSWAB VAGINITIS PLUS (VG+)
Candida albicans, NAA: NEGATIVE
Candida glabrata, NAA: NEGATIVE
Chlamydia trachomatis, NAA: NEGATIVE
Neisseria gonorrhoeae, NAA: NEGATIVE
Trich vag by NAA: POSITIVE — AB

## 2019-01-13 ENCOUNTER — Other Ambulatory Visit: Payer: Self-pay | Admitting: Family Medicine

## 2019-01-13 DIAGNOSIS — A5901 Trichomonal vulvovaginitis: Secondary | ICD-10-CM

## 2019-01-13 MED ORDER — METRONIDAZOLE 500 MG PO TABS: 2000.0000 mg | ORAL_TABLET | Freq: Once | ORAL | 0 refills | Status: AC

## 2019-01-13 MED FILL — metroNIDAZOLE 500 MG TABS: 500 | 1 days supply | Qty: 4 | Fill #0

## 2019-01-22 MED FILL — PROAIR HFA 90 MCG INHALER: 108 (90 BAS | 25 days supply | Qty: 9 | Fill #1

## 2019-01-28 MED FILL — LISINOPRIL-HCTZ 20-25 MG TA: 20-25 | 30 days supply | Qty: 30 | Fill #1

## 2019-01-30 ENCOUNTER — Other Ambulatory Visit: Payer: Self-pay | Admitting: Family Medicine

## 2019-01-30 ENCOUNTER — Other Ambulatory Visit: Payer: Self-pay

## 2019-01-30 DIAGNOSIS — Z1231 Encounter for screening mammogram for malignant neoplasm of breast: Secondary | ICD-10-CM

## 2019-01-30 DIAGNOSIS — G8929 Other chronic pain: Secondary | ICD-10-CM

## 2019-01-30 MED ORDER — DICLOFENAC SODIUM 75 MG PO TBEC: 75.0000 mg | DELAYED_RELEASE_TABLET | Freq: Two times a day (BID) | ORAL | 6 refills | Status: DC

## 2019-01-30 NOTE — Telephone Encounter (Signed)
Medication sent to pharmacy  

## 2019-02-10 ENCOUNTER — Other Ambulatory Visit: Payer: Self-pay | Admitting: Obstetrics

## 2019-02-10 DIAGNOSIS — R52 Pain, unspecified: Secondary | ICD-10-CM

## 2019-02-11 MED FILL — IBUPROFEN 800 MG TABLET: 800 | 10 days supply | Qty: 30 | Fill #0

## 2019-02-23 MED FILL — LISINOPRIL-HCTZ 20-25 MG TA: 20-25 | 30 days supply | Qty: 30 | Fill #2

## 2019-02-26 MED FILL — GABAPENTIN 300 MG CAPSULE: 300 | 30 days supply | Qty: 90 | Fill #1

## 2019-02-26 MED FILL — ?CETIRIZINE HCL 10 MG TABLE: 10 | 30 days supply | Qty: 30 | Fill #2

## 2019-03-19 ENCOUNTER — Ambulatory Visit
Admission: RE | Admit: 2019-03-19 | Discharge: 2019-03-19 | Disposition: A | Discharge status: Home / Self Care | Payer: Medicaid Other | Source: Ambulatory Visit | Attending: Family Medicine | Admitting: Family Medicine

## 2019-03-19 ENCOUNTER — Other Ambulatory Visit: Payer: Self-pay

## 2019-03-19 DIAGNOSIS — Z1231 Encounter for screening mammogram for malignant neoplasm of breast: Secondary | ICD-10-CM

## 2019-03-19 IMAGING — MG DIGITAL SCREENING BILATERAL MAMMOGRAM WITH CAD
6 series · 6 of 6 positions shown · non-contrast
Comparison: Previous exam(s).

CLINICAL DATA: Screening.

EXAM:
DIGITAL SCREENING BILATERAL MAMMOGRAM WITH CAD

[R MLO]
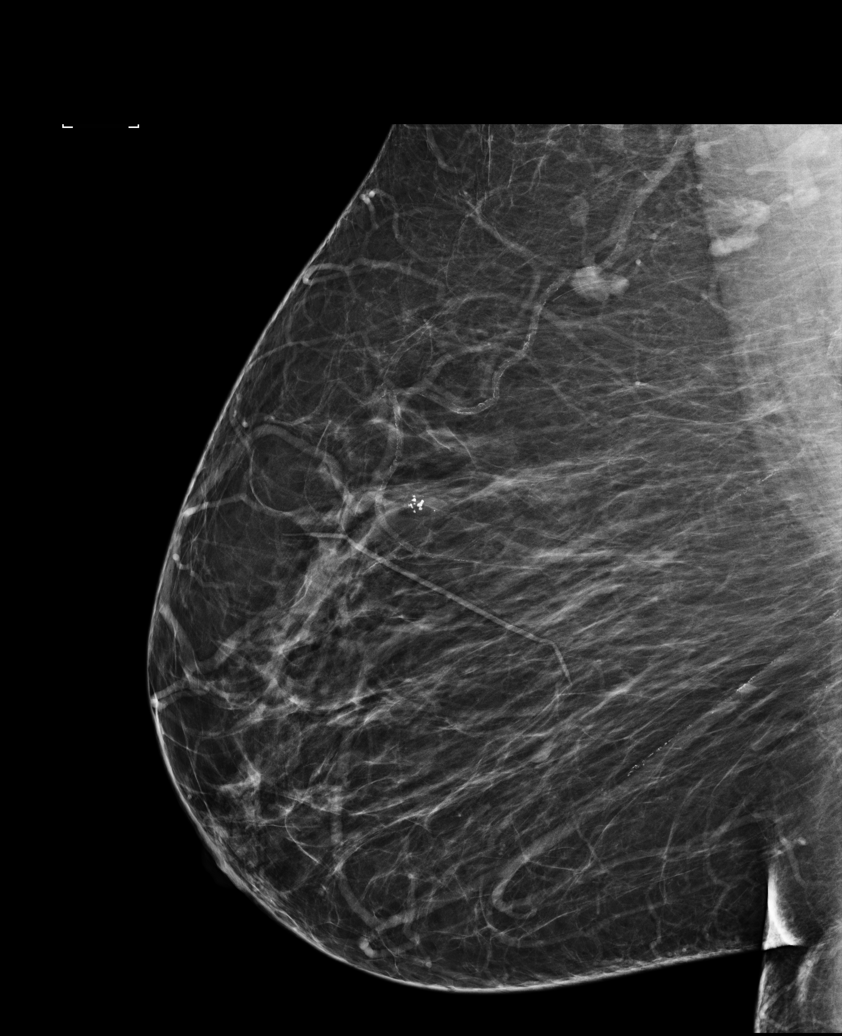

[L CC]
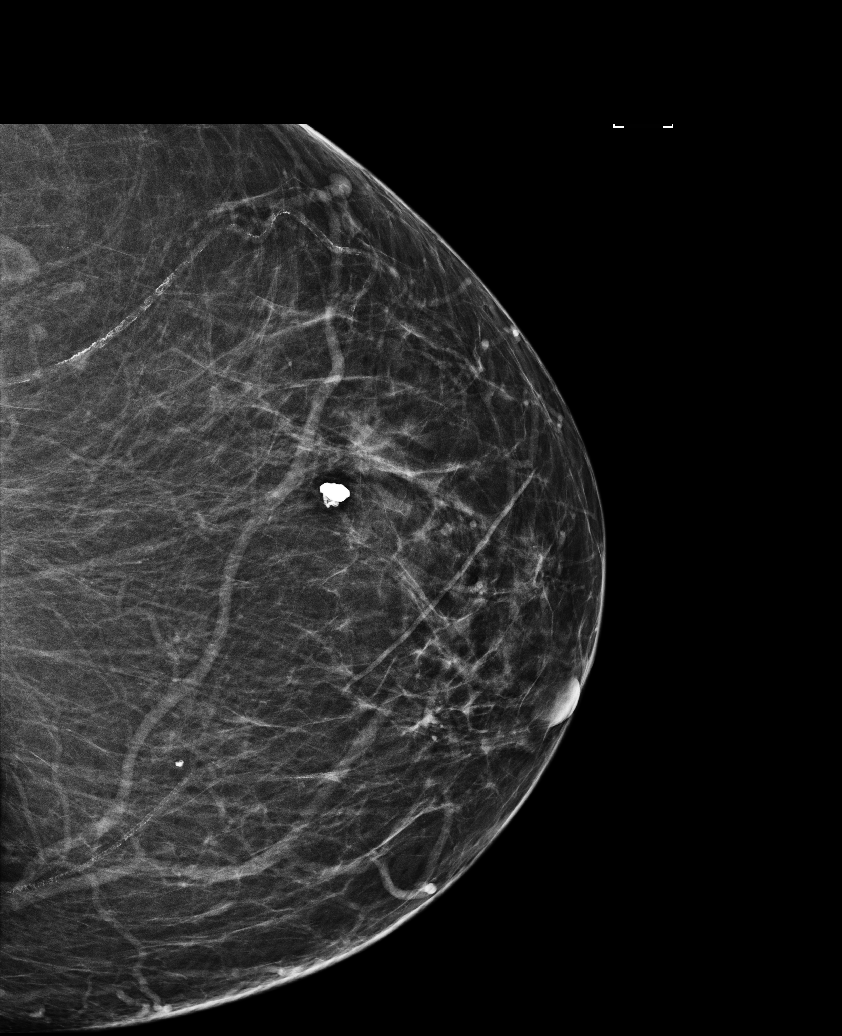

[L CV]
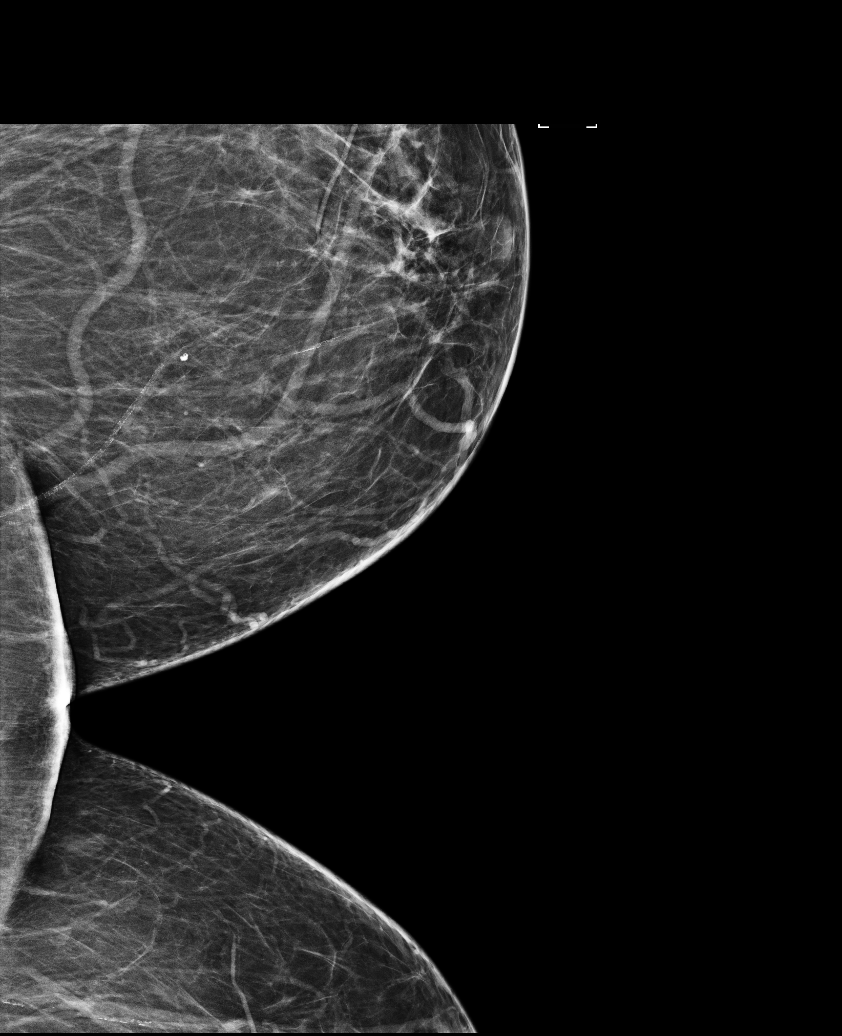

[L MLO]
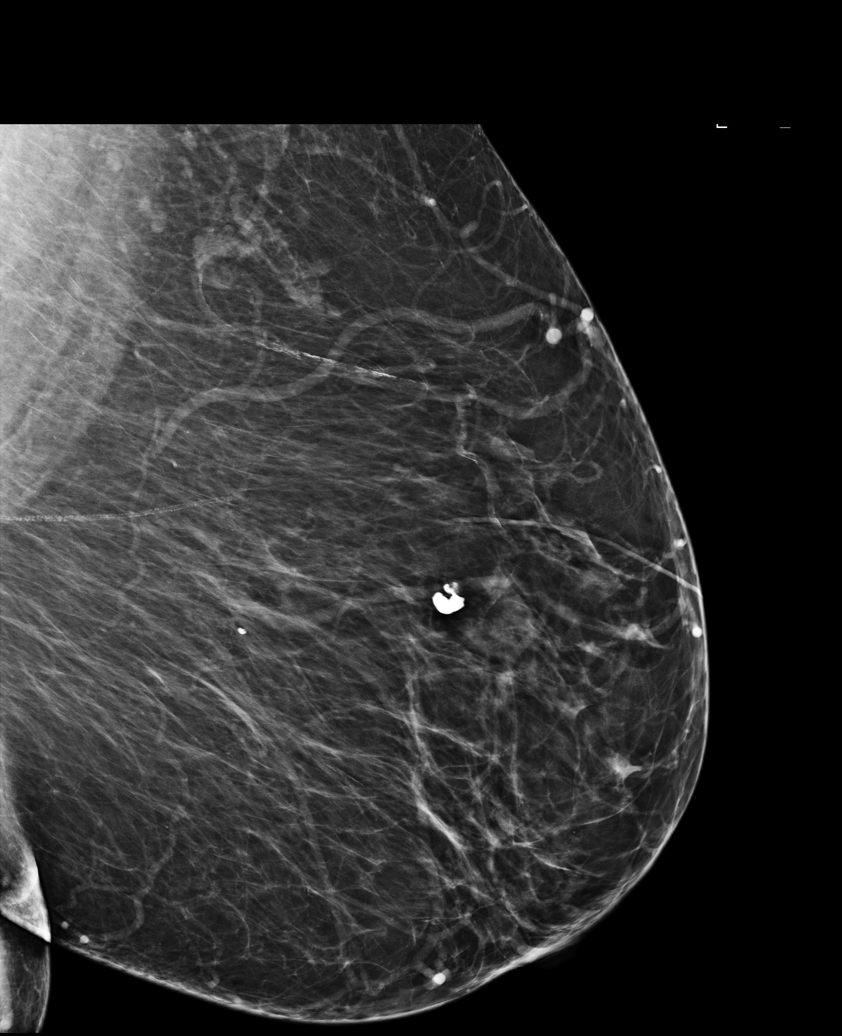

[R CC (1 of 2)]
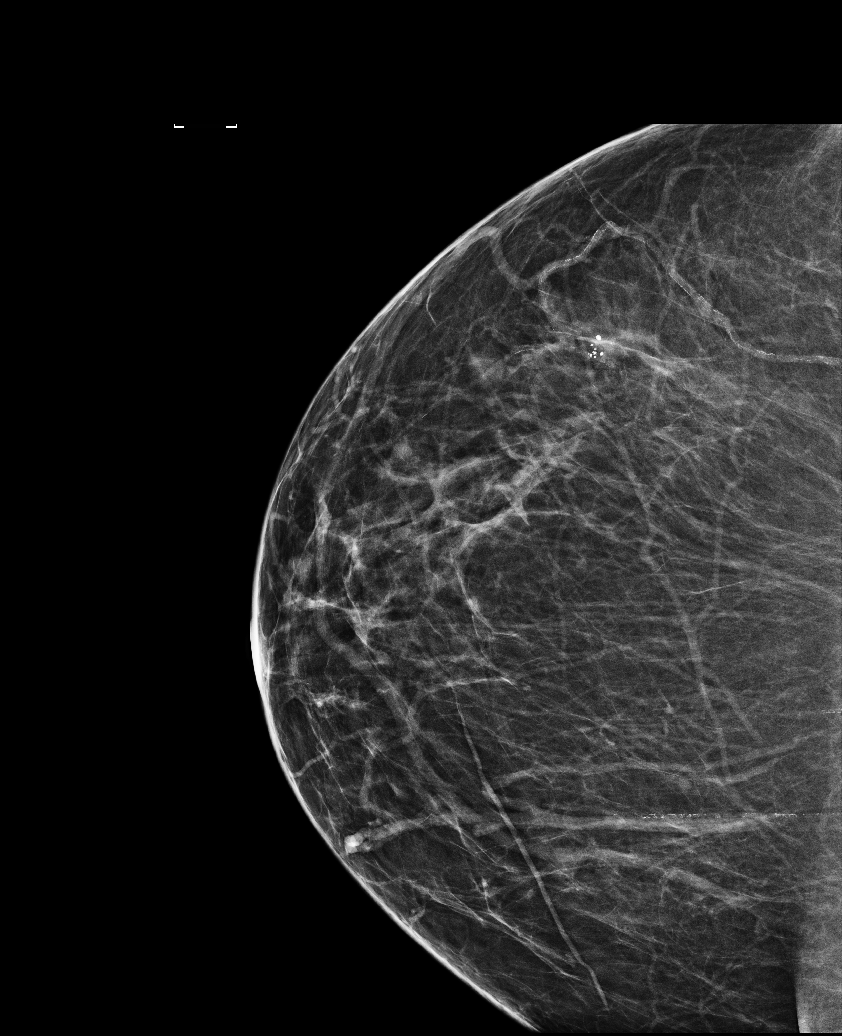

[R CC (2 of 2)]
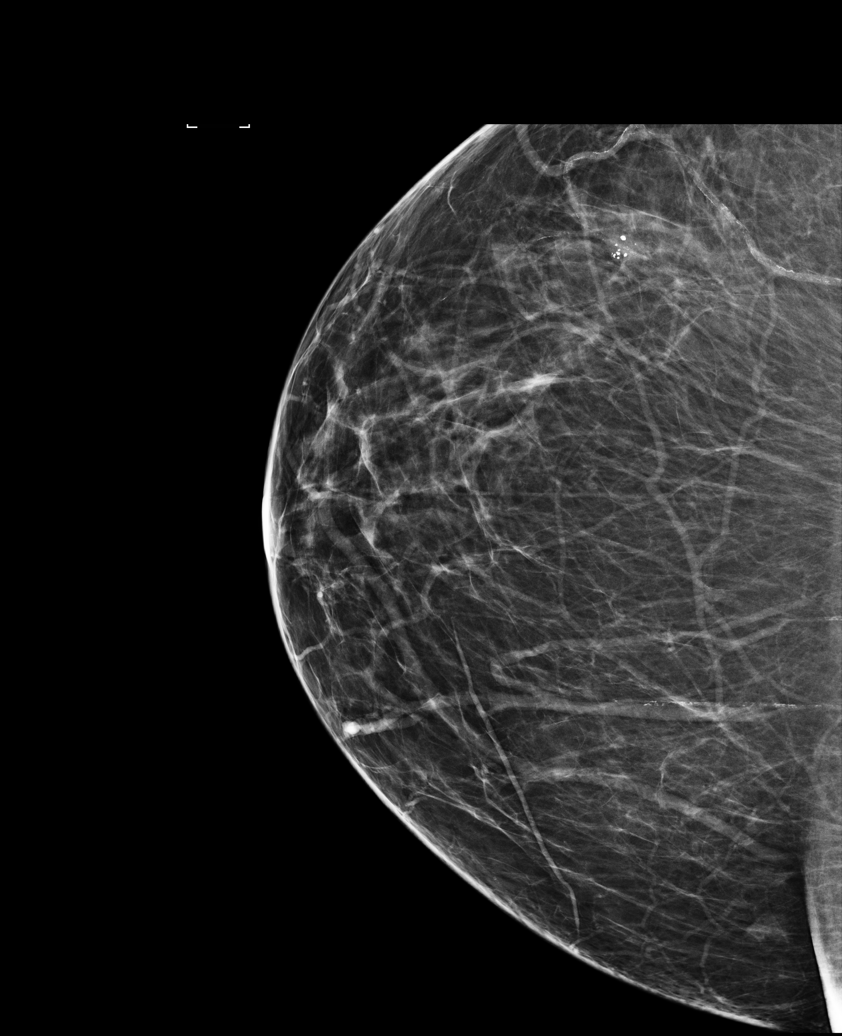

[6 of 6 positions shown; findings below may reference images not displayed]

ACR Breast Density Category b: There are scattered areas of
fibroglandular density.
FINDINGS: There are no findings suspicious for malignancy. Images were
processed with CAD.
IMPRESSION: No mammographic evidence of malignancy. A result letter of this
screening mammogram will be mailed directly to the patient.

RECOMMENDATION:
Screening mammogram in one year. (Code:[US])

BI-RADS CATEGORY  1: Negative.

## 2019-03-19 MED FILL — MONTELUKAST SOD 10 MG TAB: 10 | 30 days supply | Qty: 30 | Fill #1

## 2019-03-19 MED FILL — ?CETIRIZINE HCL 10 MG TABLE: 10 | 30 days supply | Qty: 30 | Fill #3

## 2019-03-19 MED FILL — FUROSEMIDE 20 MG TABS: 20 | 30 days supply | Qty: 30 | Fill #1

## 2019-03-23 ENCOUNTER — Other Ambulatory Visit: Payer: Self-pay

## 2019-03-23 DIAGNOSIS — L299 Pruritus, unspecified: Secondary | ICD-10-CM

## 2019-03-23 MED ORDER — HYDROXYZINE HCL 25 MG PO TABS: ORAL_TABLET | ORAL | 2 refills | Status: DC

## 2019-03-23 MED FILL — hydrOXYzine HCL 25 MG TABS: 25 | 7 days supply | Qty: 30 | Fill #0

## 2019-03-23 NOTE — Telephone Encounter (Signed)
Medication sent to pharmacy  

## 2019-03-30 MED FILL — LISINOPRIL-HCTZ 20-25 MG TA: 20-25 | 30 days supply | Qty: 30 | Fill #3

## 2019-03-31 MED FILL — hydrOXYzine HCL 25 MG TABS: 25 | 7 days supply | Qty: 30 | Fill #0

## 2019-04-13 MED FILL — IBUPROFEN 800 MG TABLET: 800 | 10 days supply | Qty: 30 | Fill #1

## 2019-04-21 MED FILL — GABAPENTIN 300 MG CAPSULE: 300 | 30 days supply | Qty: 90 | Fill #2

## 2019-04-21 MED FILL — IBUPROFEN 800 MG TABLET: 800 | 10 days supply | Qty: 30 | Fill #1

## 2019-05-20 ENCOUNTER — Ambulatory Visit: Payer: Medicaid Other | Admitting: Family Medicine

## 2019-05-20 MED FILL — IBUPROFEN 800 MG TABLET: 800 | 10 days supply | Qty: 30 | Fill #2

## 2019-05-20 MED FILL — LISINOPRIL-HCTZ 20-25 MG TA: 20-25 | 30 days supply | Qty: 30 | Fill #4

## 2019-05-20 MED FILL — FUROSEMIDE 20 MG TABS: 20 | 30 days supply | Qty: 30 | Fill #2

## 2019-05-30 ENCOUNTER — Other Ambulatory Visit: Payer: Self-pay

## 2019-05-30 ENCOUNTER — Emergency Department (HOSPITAL_COMMUNITY)
Admission: EM | Admit: 2019-05-30 | Discharge: 2019-05-30 | Disposition: A | Discharge status: Home / Self Care | Payer: Medicaid Other | Attending: Emergency Medicine | Admitting: Emergency Medicine

## 2019-05-30 ENCOUNTER — Ambulatory Visit (HOSPITAL_COMMUNITY)
Admission: EM | Admit: 2019-05-30 | Discharge: 2019-05-30 | Disposition: A | Discharge status: Home / Self Care | Payer: Medicaid Other | Attending: Internal Medicine | Admitting: Internal Medicine

## 2019-05-30 ENCOUNTER — Ambulatory Visit (HOSPITAL_COMMUNITY)
Admission: EM | Admit: 2019-05-30 | Discharge: 2019-05-30 | Discharge status: Absent without leave | Payer: Medicaid Other

## 2019-05-30 ENCOUNTER — Encounter (HOSPITAL_COMMUNITY): Payer: Self-pay | Admitting: Emergency Medicine

## 2019-05-30 DIAGNOSIS — I10 Essential (primary) hypertension: Secondary | ICD-10-CM | POA: Insufficient documentation

## 2019-05-30 DIAGNOSIS — T7840XA Allergy, unspecified, initial encounter: Secondary | ICD-10-CM | POA: Insufficient documentation

## 2019-05-30 DIAGNOSIS — L299 Pruritus, unspecified: Secondary | ICD-10-CM | POA: Diagnosis present

## 2019-05-30 DIAGNOSIS — T7849XA Other allergy, initial encounter: Secondary | ICD-10-CM | POA: Diagnosis not present

## 2019-05-30 DIAGNOSIS — Z87891 Personal history of nicotine dependence: Secondary | ICD-10-CM | POA: Insufficient documentation

## 2019-05-30 DIAGNOSIS — J45909 Unspecified asthma, uncomplicated: Secondary | ICD-10-CM | POA: Insufficient documentation

## 2019-05-30 DIAGNOSIS — Z79899 Other long term (current) drug therapy: Secondary | ICD-10-CM | POA: Insufficient documentation

## 2019-05-30 MED ORDER — PREDNISONE 20 MG PO TABS: ORAL_TABLET | ORAL | 0 refills | Status: DC

## 2019-05-30 MED ORDER — DIPHENHYDRAMINE HCL 50 MG/ML IJ SOLN
50.0000 mg | Freq: Once | INTRAMUSCULAR | Status: AC
  Administered 2019-05-30: 50 mg via INTRAMUSCULAR

## 2019-05-30 MED ORDER — DIPHENHYDRAMINE HCL 25 MG PO CAPS
50.0000 mg | ORAL_CAPSULE | Freq: Once | ORAL | Status: AC
  Administered 2019-05-30: 50 mg via ORAL
  Filled 2019-05-30: qty 2

## 2019-05-30 MED ORDER — FAMOTIDINE 20 MG PO TABS
20.0000 mg | ORAL_TABLET | Freq: Once | ORAL | Status: AC
  Administered 2019-05-30: 20 mg via ORAL
  Filled 2019-05-30: qty 1

## 2019-05-30 MED ORDER — DIPHENHYDRAMINE HCL 50 MG/ML IJ SOLN: 50.0000 mg | Freq: Once | INTRAMUSCULAR | Status: DC

## 2019-05-30 MED ORDER — PREDNISONE 20 MG PO TABS
60.0000 mg | ORAL_TABLET | Freq: Once | ORAL | Status: AC
  Administered 2019-05-30: 13:00:00 60 mg via ORAL
  Filled 2019-05-30: qty 3

## 2019-05-30 MED ORDER — EPINEPHRINE 0.3 MG/0.3ML IJ SOAJ
INTRAMUSCULAR | Status: AC
  Filled 2019-05-30: qty 0.3

## 2019-05-30 NOTE — ED Triage Notes (Signed)
Niece stated, she is having an allergic reaction to Henna. Went to UC given Benadryl 50 mg.IM and sent her to ER cause she stated she was having trouble swallow. Pt. Able to answer questions.

## 2019-05-30 NOTE — ED Notes (Signed)
Pt will take benadryl after 5pm -- no resp distress -- no swallowing difficulty.  Her mother's funeral is at 2pm-- is going with family.

## 2019-05-30 NOTE — ED Provider Notes (Addendum)
[facility] Provider Note   CSN: [id number] Arrival date & time: 05/30/19  1248     History   Chief Complaint Chief Complaint  Patient presents with  . Allergic Reaction    HPI Caroline Ryan is a 53 y.o. female.     52 yo F with a cc of allergic reaction.  Patient's mother died and is having a funeral today.  Patient got her hair done yesterday in anticipation of this.  She has had a reaction to hair dye in the past.  Started having significant itching to her face and her scalp.  Denies shortness of breath denies wheezing denies vomiting diarrhea denies feeling that she might pass out.  The history is provided by the patient.  Allergic Reaction Presenting symptoms: itching and rash   Presenting symptoms: no wheezing   Severity:  Moderate Duration:  2 days Prior allergic episodes:  No prior episodes Context: cosmetics   Relieved by:  Nothing Worsened by:  Nothing Ineffective treatments:  None tried   Past Medical History:  Diagnosis Date  . Anemia   . Asthma   . Depression    no meds  . Eczema   . Fibroid   . Headache(784.0)    otc prn med  . History of blood transfusion   . Hypertension   . Knee pain, bilateral    arthralgia knee pain bilateral  . Seasonal allergies   . SVD (spontaneous vaginal delivery)    x 3    Patient Active Problem List   Diagnosis Date Noted  . Bilateral lower extremity edema 11/02/2018  . Post-operative state 10/10/2017  . Symptomatic anemia 05/20/2017  . Arthralgia of both knees 06/27/2015  . BACK STRAIN, LUMBAR 03/16/2010  . KNEE PAIN, BILATERAL 06/30/2009  . OBESITY 04/13/2009  . EDEMA 04/13/2009  . [place], BREAST 04/20/2008  . HYPERTENSION, BENIGN ESSENTIAL 04/14/2008  . Allergic rhinitis 04/14/2008  . Asthma 04/14/2008  . LYMPHADENOPATHY, LEFT AXILLA 04/14/2008    Past Surgical History:  Procedure Laterality Date  . ABDOMINAL HYSTERECTOMY    . BILATERAL  SALPINGECTOMY Bilateral 10/10/2017   Procedure: BILATERAL SALPINGECTOMY;  Surgeon: Chancy Milroy, MD;  Location: [facility];  Service: Gynecology;  Laterality: Bilateral;  . BREAST BIOPSY     US guided core 2009  . BREAST EXCISIONAL BIOPSY     right 1982 left 1981  . BREAST SURGERY     bilateral cysts/benign  . VAGINAL HYSTERECTOMY N/A 10/10/2017   Procedure: HYSTERECTOMY VAGINAL;  Surgeon: Chancy Milroy, MD;  Location: [facility];  Service: Gynecology;  Laterality: N/A;     OB History    Gravida  3   Para  3   Term  3   Preterm  0   AB  0   Living  3     SAB  0   TAB  0   Ectopic  0   Multiple  0   Live Births               Home Medications    Prior to Admission medications   Medication Sig Start Date End Date Taking? Authorizing Provider  albuterol (VENTOLIN HFA) 108 (90 Base) MCG/ACT inhaler INHALE 2 PUFFS INTO THE LUNGS EVERY 6 HOURS AS NEEDED FOR WHEEZING OR SHORTNESS OF BREATH. ASTHMA ATTACKS 01/01/19   [name], FNP  cetirizine (ZYRTEC ALLERGY) 10 MG tablet Take 1 tablet (10 mg total) by mouth daily. 06/09/18   Kathe Becton  M, FNP  diclofenac (VOLTAREN) 75 MG EC tablet Take 1 tablet (75 mg total) by mouth 2 (two) times daily. 01/30/19   [name], FNP  diphenhydrAMINE (BENADRYL) 25 MG tablet Take 1 tablet (25 mg total) by mouth every 6 (six) hours as needed for itching. 06/09/18   [name], FNP  docusate sodium (COLACE) 100 MG capsule Take 1 capsule (100 mg total) by mouth 2 (two) times daily as needed. 06/09/18   [name], FNP  furosemide (LASIX) 20 MG tablet Take 1 tablet (20 mg total) by mouth daily. 12/05/18 07/03/19  [name], FNP  gabapentin (NEURONTIN) 300 MG capsule Take 1 capsule (300 mg total) by mouth 3 (three) times daily. 12/05/18   [name], FNP  hydrocortisone 2.5 % cream Apply topically 2 (two) times daily. 01/05/19   [name], FNP  hydrOXYzine (ATARAX/VISTARIL) 25 MG tablet TAKE 1  TABLET BY MOUTH EVERY 6 HOURS AS NEEDED FOR ITCHING. 03/23/19   [name], FNP  ibuprofen (ADVIL) 800 MG tablet TAKE 1 TABLET BY MOUTH 3 TIMES DAILY. 02/11/19   Shelly Bombard, MD  lisinopril-hydrochlorothiazide (ZESTORETIC) 20-25 MG tablet Take 1 tablet by mouth daily. 12/05/18   [name], FNP  montelukast (SINGULAIR) 10 MG tablet Take 1 tablet (10 mg total) by mouth at bedtime. 12/05/18   [name], FNP  predniSONE (DELTASONE) 20 MG tablet 2 tabs po daily x 4 days 05/30/19   Deno Etienne, DO  sulfamethoxazole-trimethoprim (BACTRIM DS) 800-160 MG tablet Take 1 tablet by mouth 2 (two) times daily. 12/24/18   [name], FNP    Family History Family History  Problem Relation Age of Onset  . Hypertension Father   . Diabetes Brother   . Diabetes Brother   . Breast cancer Mother 11  . Other Neg Hx     Social History Social History   Tobacco Use  . Smoking status: Former Smoker    Packs/day: 0.25    Years: 2.00    Pack years: 0.50    Types: Cigarettes    Quit date: 08/20/2014    Years since quitting: 4.7  . Smokeless tobacco: Never Used  Substance Use Topics  . Alcohol use: Yes    Alcohol/week: 2.0 standard drinks    Types: 2 Glasses of wine per week  . Drug use: No     Allergies   Patient has no known allergies.   Review of Systems Review of Systems  Constitutional: Negative for chills and fever.  HENT: Negative for congestion and rhinorrhea.   Eyes: Negative for redness and visual disturbance.  Respiratory: Negative for shortness of breath and wheezing.   Cardiovascular: Negative for chest pain and palpitations.  Gastrointestinal: Negative for nausea and vomiting.  Genitourinary: Negative for dysuria and urgency.  Musculoskeletal: Negative for arthralgias and myalgias.  Skin: Positive for itching and rash. Negative for pallor and wound.  Neurological: Negative for dizziness and headaches.     Physical Exam Updated Vital Signs BP (!)  160/101 (BP Location: Left Wrist)   Pulse (!) 103   Temp 98.4 F (36.9 C) (Oral)   Resp (!) 22   Ht 5\' 1"  (1.549 m)   Wt 72.6 kg   LMP 04/03/2017 (Approximate)   SpO2 98%   BMI 30.23 kg/m   Physical Exam Vitals signs and nursing note reviewed.  Constitutional:      General: She is not in acute distress.    Appearance: She  is well-developed. She is not diaphoretic.  HENT:     Head: Normocephalic and atraumatic.     Comments: Rash and edema to the face.  Mostly to the region of the eyebrows.  Some mild edema to the hands.  No other rash. Eyes:     Pupils: Pupils are equal, round, and reactive to light.  Neck:     Musculoskeletal: Normal range of motion and neck supple.  Cardiovascular:     Rate and Rhythm: Normal rate and regular rhythm.     Heart sounds: No murmur. No friction rub. No gallop.   Pulmonary:     Effort: Pulmonary effort is normal.     Breath sounds: No wheezing or rales.  Abdominal:     General: There is no distension.     Palpations: Abdomen is soft.     Tenderness: There is no abdominal tenderness.  Musculoskeletal:        General: No tenderness.  Skin:    General: Skin is warm and dry.  Neurological:     Mental Status: She is alert and oriented to person, place, and time.  Psychiatric:        Behavior: Behavior normal.      ED Treatments / Results  Labs (all labs ordered are listed, but only abnormal results are displayed) Labs Reviewed - No data to display  EKG None  Radiology No results found.  Procedures Procedures (including critical care time)  Medications Ordered in ED Medications  predniSONE (DELTASONE) tablet 60 mg (60 mg Oral Given 05/30/19 1312)  famotidine (PEPCID) tablet 20 mg (20 mg Oral Given 05/30/19 1312)  diphenhydrAMINE (BENADRYL) capsule 50 mg (50 mg Oral Given 05/30/19 1312)     Initial Impression / Assessment and Plan / ED Course  I have reviewed the triage vital signs and the nursing notes.  Pertinent labs &  imaging results that were available during my care of the patient were reviewed by me and considered in my medical decision making (see chart for details).        53 yo F with a chief complaint of a rash.  Patient has had a similar issue when she had her hair dyed previously.  Most likely this is a contact dermatitis due to the hair dye.  She otherwise is well-appearing and nontoxic.  Been going on for greater than a day.  I do not feel that I need to watch her in the ED as this been slowly progressing since yesterday.  I suggested that she go to a hair salon and get the hair dye thoroughly removed.  Will start on burst dose of prednisone.  Antihistamines.    1:14 PM:  I have discussed the diagnosis/risks/treatment options with the patient and believe the pt to be eligible for discharge home to follow-up with PCP. We also discussed returning to the ED immediately if new or worsening sx occur. We discussed the sx which are most concerning (e.g., sudden worsening pain, fever, inability to tolerate by mouth) that necessitate immediate return. Medications administered to the patient during their visit and any new prescriptions provided to the patient are listed below.  Medications given during this visit Medications  predniSONE (DELTASONE) tablet 60 mg (60 mg Oral Given 05/30/19 1312)  famotidine (PEPCID) tablet 20 mg (20 mg Oral Given 05/30/19 1312)  diphenhydrAMINE (BENADRYL) capsule 50 mg (50 mg Oral Given 05/30/19 1312)     The patient appears reasonably screen and/or stabilized for discharge and I doubt any other  medical condition or other [facility] requiring further screening, evaluation, or treatment in the ED at this time prior to discharge.    Final Clinical Impressions(s) / ED Diagnoses   Final diagnoses:  Allergic reaction, initial encounter    ED Discharge Orders         Ordered    predniSONE (DELTASONE) 20 MG tablet     05/30/19 1258           Deno Etienne, DO 05/30/19 [place], [place], DO 05/30/19 1314

## 2019-05-30 NOTE — ED Notes (Signed)
Pt states she had allergic reaction to henna, per Sunday Spillers PA, given 50mg  IM bendryl and head to ER. Pt agreeable to plan.

## 2019-05-30 NOTE — Discharge Instructions (Signed)
Most likely you are having allergic reaction to here hair dye.  Please go to a hair salon and get this removed.  Take the steroids as prescribed.  You can take either Benadryl 4 times a day 25 mg to help with the itching, or you can take 2 over-the-counter Zyrtec tablets once a day.  The second option would be less likely make you sleepy.  Please return to the emergency department for shortness of breath vomiting diarrhea or if you feel like you are going to pass out.  Follow-up with your family doctor in the office.

## 2019-06-01 MED FILL — predniSONE 20 MG TABS: 20 | 8 days supply | Qty: 8 | Fill #0

## 2019-06-08 ENCOUNTER — Ambulatory Visit: Payer: Self-pay | Admitting: Family Medicine

## 2019-06-10 ENCOUNTER — Other Ambulatory Visit: Payer: Self-pay

## 2019-06-10 ENCOUNTER — Ambulatory Visit (INDEPENDENT_AMBULATORY_CARE_PROVIDER_SITE_OTHER): Payer: Medicaid Other | Admitting: Family Medicine

## 2019-06-10 ENCOUNTER — Encounter: Payer: Self-pay | Admitting: Family Medicine

## 2019-06-10 VITALS — BP 124/86 | HR 95 | Temp 97.6°F | Ht 61.0 in | Wt 249.4 lb

## 2019-06-10 DIAGNOSIS — Z0000 Encounter for general adult medical examination without abnormal findings: Secondary | ICD-10-CM | POA: Diagnosis not present

## 2019-06-10 DIAGNOSIS — Z23 Encounter for immunization: Secondary | ICD-10-CM

## 2019-06-10 DIAGNOSIS — R8290 Unspecified abnormal findings in urine: Secondary | ICD-10-CM

## 2019-06-10 DIAGNOSIS — Z6841 Body Mass Index (BMI) 40.0 and over, adult: Secondary | ICD-10-CM | POA: Diagnosis not present

## 2019-06-10 DIAGNOSIS — F4321 Adjustment disorder with depressed mood: Secondary | ICD-10-CM | POA: Insufficient documentation

## 2019-06-10 DIAGNOSIS — Z09 Encounter for follow-up examination after completed treatment for conditions other than malignant neoplasm: Secondary | ICD-10-CM

## 2019-06-10 DIAGNOSIS — R634 Abnormal weight loss: Secondary | ICD-10-CM | POA: Diagnosis not present

## 2019-06-10 DIAGNOSIS — T7840XS Allergy, unspecified, sequela: Secondary | ICD-10-CM

## 2019-06-10 DIAGNOSIS — L299 Pruritus, unspecified: Secondary | ICD-10-CM | POA: Diagnosis not present

## 2019-06-10 DIAGNOSIS — T7840XA Allergy, unspecified, initial encounter: Secondary | ICD-10-CM | POA: Diagnosis not present

## 2019-06-10 DIAGNOSIS — E66813 Obesity, class 3: Secondary | ICD-10-CM

## 2019-06-10 LAB — POCT URINALYSIS DIPSTICK
Bilirubin, UA: NEGATIVE
Glucose, UA: NEGATIVE
Ketones, UA: NEGATIVE
Nitrite, UA: NEGATIVE
Protein, UA: NEGATIVE
Spec Grav, UA: 1.025 (ref 1.010–1.025)
Urobilinogen, UA: 0.2 E.U./dL
pH, UA: 5.5 (ref 5.0–8.0)

## 2019-06-10 LAB — POCT GLYCOSYLATED HEMOGLOBIN (HGB A1C): Hemoglobin A1C: 5 % (ref 4.0–5.6)

## 2019-06-10 LAB — GLUCOSE, POCT (MANUAL RESULT ENTRY): POC Glucose: 86 mg/dl (ref 70–99)

## 2019-06-10 MED ORDER — HYDROXYZINE HCL 25 MG PO TABS: ORAL_TABLET | ORAL | 2 refills | Status: DC

## 2019-06-10 MED ORDER — PREDNISONE 20 MG PO TABS: ORAL_TABLET | ORAL | 0 refills | Status: DC

## 2019-06-10 MED FILL — predniSONE 20 MG TABS: 20 | 4 days supply | Qty: 8 | Fill #0

## 2019-06-10 MED FILL — hydrOXYzine HCL 25 MG TABS: 25 | 7 days supply | Qty: 30 | Fill #0

## 2019-06-10 NOTE — Addendum Note (Signed)
Addended by: [name] on: 06/10/2019 02:44 PM   Modules accepted: Orders

## 2019-06-10 NOTE — Progress Notes (Signed)
Patient Caroline Ryan  Subjective:  Patient ID: Caroline Ryan, female    DOB: 1966-01-03  Age: 53 y.o. MRN: [id number]  CC:  Chief Complaint  Patient presents with  . Follow-Ryan    htn  . skin itching    scalp- seen  in ER for allergic reaction to hair color    HPI Caroline Ryan is a 53 year old female who presents for Hospital Follow Ryan today.   Past Medical History:  Diagnosis Date  . Anemia   . Asthma   . Depression    no meds  . Eczema   . Fibroid   . Headache(784.0)    otc prn med  . History of blood transfusion   . Hypertension   . Knee pain, bilateral    arthralgia knee pain bilateral  . Seasonal allergies   . SVD (spontaneous vaginal delivery)    x 3   Current Status: Since her last office visit, she has had an ED visit for an Allergic Reaction to hair color. Today, she doing well, but continues to have itching and rash. She states that she is not taking medications as prescribed by ED for allergic reaction. She denies fevers, chills, fatigue, recent infections, weight loss, and night sweats. She has not had any headaches, visual changes, dizziness, and falls. No chest pain, heart palpitations, cough and shortness of breath reported. No reports of GI problems such as nausea, vomiting, diarrhea, and constipation. She has no reports of blood in stools, dysuria and hematuria. Her anxiety is moderate today, r/t the recent death of her mother a few weeks ago. She denies suicidal ideations, homicidal ideations, or auditory hallucinations. She denies pain today.   Past Surgical History:  Procedure Laterality Date  . ABDOMINAL HYSTERECTOMY    . BILATERAL SALPINGECTOMY Bilateral 10/10/2017   Procedure: BILATERAL SALPINGECTOMY;  Surgeon: Chancy Milroy, MD;  Location: [facility];  Service: Gynecology;  Laterality: Bilateral;  . BREAST BIOPSY     US guided core 2009  . BREAST EXCISIONAL BIOPSY     right  1982 left 1981  . BREAST SURGERY     bilateral cysts/benign  . VAGINAL HYSTERECTOMY N/A 10/10/2017   Procedure: HYSTERECTOMY VAGINAL;  Surgeon: Chancy Milroy, MD;  Location: [facility];  Service: Gynecology;  Laterality: N/A;    Family History  Problem Relation Age of Onset  . Hypertension Father   . Diabetes Brother   . Diabetes Brother   . Breast cancer Mother 72  . Other Neg Hx     Social History   Socioeconomic History  . Marital status: Single    Spouse name: Not on file  . Number of children: Not on file  . Years of education: Not on file  . Highest education level: Not on file  Occupational History  . Not on file  Social Needs  . Financial resource strain: Not on file  . Food insecurity    Worry: Not on file    Inability: Not on file  . Transportation needs    Medical: Not on file    Non-medical: Not on file  Tobacco Use  . Smoking status: Former Smoker    Packs/day: 0.25    Years: 2.00    Pack years: 0.50    Types: Cigarettes    Quit date: 08/20/2014    Years since quitting: 4.8  . Smokeless tobacco: Never Used  Substance and Sexual Activity  .  Alcohol use: Yes    Alcohol/week: 2.0 standard drinks    Types: 2 Glasses of wine per week  . Drug use: No  . Sexual activity: Yes    Birth control/protection: Condom  Lifestyle  . Physical activity    Days per week: Not on file    Minutes per session: Not on file  . Stress: Not on file  Relationships  . Social Herbalist on phone: Not on file    Gets together: Not on file    Attends religious service: Not on file    Active member of club or organization: Not on file    Attends meetings of clubs or organizations: Not on file    Relationship status: Not on file  . Intimate partner violence    Fear of current or ex partner: Not on file    Emotionally abused: Not on file    Physically abused: Not on file    Forced sexual activity: Not on file  Other Topics Concern  . Not on file  Social History  Narrative  . Not on file    Outpatient Medications Prior to Visit  Medication Sig Dispense Refill  . albuterol (VENTOLIN HFA) 108 (90 Base) MCG/ACT inhaler INHALE 2 PUFFS INTO THE LUNGS EVERY 6 HOURS AS NEEDED FOR WHEEZING OR SHORTNESS OF BREATH. ASTHMA ATTACKS 18 g 2  . cetirizine (ZYRTEC ALLERGY) 10 MG tablet Take 1 tablet (10 mg total) by mouth daily. 30 tablet 3  . diclofenac (VOLTAREN) 75 MG EC tablet Take 1 tablet (75 mg total) by mouth 2 (two) times daily. 60 tablet 6  . diphenhydrAMINE (BENADRYL) 25 MG tablet Take 1 tablet (25 mg total) by mouth every 6 (six) hours as needed for itching. 20 tablet 0  . docusate sodium (COLACE) 100 MG capsule Take 1 capsule (100 mg total) by mouth 2 (two) times daily as needed. 30 capsule 2  . furosemide (LASIX) 20 MG tablet Take 1 tablet (20 mg total) by mouth daily. 30 tablet 6  . gabapentin (NEURONTIN) 300 MG capsule Take 1 capsule (300 mg total) by mouth 3 (three) times daily. 90 capsule 6  . hydrocortisone 2.5 % cream Apply topically 2 (two) times daily. 30 g 0  . ibuprofen (ADVIL) 800 MG tablet TAKE 1 TABLET BY MOUTH 3 TIMES DAILY. 30 tablet 5  . lisinopril-hydrochlorothiazide (ZESTORETIC) 20-25 MG tablet Take 1 tablet by mouth daily. 30 tablet 6  . montelukast (SINGULAIR) 10 MG tablet Take 1 tablet (10 mg total) by mouth at bedtime. 30 tablet 6  . hydrOXYzine (ATARAX/VISTARIL) 25 MG tablet TAKE 1 TABLET BY MOUTH EVERY 6 HOURS AS NEEDED FOR ITCHING. 30 tablet 2  . predniSONE (DELTASONE) 20 MG tablet 2 tabs po daily x 4 days 8 tablet 0  . sulfamethoxazole-trimethoprim (BACTRIM DS) 800-160 MG tablet Take 1 tablet by mouth 2 (two) times daily. 14 tablet 0   No facility-administered medications prior to visit.     No Known Allergies  ROS Review of Systems  Constitutional: Negative.   HENT: Negative.   Eyes: Negative.   Respiratory: Positive for shortness of breath (occasional ).   Cardiovascular: Negative.   Gastrointestinal: Negative.    Endocrine: Negative.   Genitourinary: Negative.   Musculoskeletal: Negative.   Skin: Negative.   Allergic/Immunologic: Negative.   Neurological: Positive for dizziness (occasional ) and headaches (occasional ).  Psychiatric/Behavioral: Negative.       Objective:    Physical Exam  Constitutional: She is  oriented to person, place, and time. She appears well-developed and well-nourished.  HENT:  Head: Normocephalic and atraumatic.  Eyes: Conjunctivae are normal.  Neck: Normal range of motion. Neck supple.  Cardiovascular: Normal rate, regular rhythm, normal heart sounds and intact distal pulses.  Pulmonary/Chest: Effort normal and breath sounds normal.  Abdominal: Soft. Bowel sounds are normal. She exhibits distension (obese).  Musculoskeletal: Normal range of motion.  Neurological: She is alert and oriented to person, place, and time. She has normal reflexes.  Skin: Skin is warm and dry. Rash noted.  Psychiatric: She has a normal mood and affect. Her behavior is normal. Judgment and thought content normal.    BP 124/86   Pulse 95   Temp 97.6 F (36.4 C) (Oral)   Ht 5\' 1"  (1.549 m)   Wt 249 lb 6.4 oz (113.1 kg)   LMP 04/03/2017 (Approximate)   SpO2 98%   BMI 47.12 kg/m  Wt Readings from Last 3 Encounters:  06/10/19 249 lb 6.4 oz (113.1 kg)  05/30/19 160 lb (72.6 kg)  12/24/18 252 lb (114.3 kg)     Health Maintenance Due  Topic Date Due  . COLONOSCOPY  02/07/2014    There are no preventive care reminders to display for this patient.  Lab Results  Component Value Date   TSH 1.530 12/24/2018   Lab Results  Component Value Date   WBC 6.6 12/24/2018   HGB 11.4 12/24/2018   HCT 35.1 12/24/2018   MCV 94 12/24/2018   PLT 170 12/24/2018   Lab Results  Component Value Date   NA 142 12/24/2018   K 4.0 12/24/2018   CO2 26 12/24/2018   GLUCOSE 86 12/24/2018   BUN 20 12/24/2018   CREATININE 0.65 12/24/2018   BILITOT 0.3 12/24/2018   ALKPHOS 102 12/24/2018    AST 13 12/24/2018   ALT 17 12/24/2018   PROT 7.2 12/24/2018   ALBUMIN 4.4 12/24/2018   CALCIUM 10.2 12/24/2018   ANIONGAP 7 10/11/2017   Lab Results  Component Value Date   CHOL 180 12/24/2018   Lab Results  Component Value Date   HDL 72 12/24/2018   Lab Results  Component Value Date   LDLCALC 97 12/24/2018   Lab Results  Component Value Date   TRIG 57 12/24/2018   Lab Results  Component Value Date   CHOLHDL 2.5 12/24/2018   Lab Results  Component Value Date   HGBA1C 5.0 06/10/2019      Assessment & Plan:   1. Hospital discharge follow-Ryan  2. Allergic reaction, sequela She will begin Prednisone today.  - predniSONE (DELTASONE) 20 MG tablet; 2 tabs po daily x 4 days  Dispense: 8 tablet; Refill: 0 - hydrOXYzine (ATARAX/VISTARIL) 25 MG tablet; TAKE 1 TABLET BY MOUTH EVERY 6 HOURS AS NEEDED FOR ITCHING.  Dispense: 30 tablet; Refill: 2  3. Grieving Recent loss of her mother. We will contact Hospice Grievance Counselor.  4. Pruritus - hydrOXYzine (ATARAX/VISTARIL) 25 MG tablet; TAKE 1 TABLET BY MOUTH EVERY 6 HOURS AS NEEDED FOR ITCHING.  Dispense: 30 tablet; Refill: 2  5. Class 3 severe obesity due to excess calories with serious comorbidity and body mass index (BMI) of 45.0 to 49.9 in adult [facility]) Body mass index is 47.12 kg/m.  Goal BMI  is <30. Encouraged efforts to reduce weight include engaging in physical activity as tolerated with goal of 150 minutes per week. Improve dietary choices and eat a meal regimen consistent with a Mediterranean or DASH diet. Reduce simple  carbohydrates. Do not skip meals and eat healthy snacks throughout the day to avoid over-eating at dinner. Set a goal weight loss that is achievable for you.  6. Weight loss She has 3 lbs weight loss.   7. Healthcare maintenance - POCT glucose (manual entry) - POCT glycosylated hemoglobin (Hb A1C) - POCT urinalysis dipstick  8. Flu vaccine need - Flu Vaccine QUAD 36+ mos IM  9. Follow Ryan She  will follow Ryan in 3 months.   Meds ordered this encounter  Medications  . predniSONE (DELTASONE) 20 MG tablet    Sig: 2 tabs po daily x 4 days    Dispense:  8 tablet    Refill:  0  . hydrOXYzine (ATARAX/VISTARIL) 25 MG tablet    Sig: TAKE 1 TABLET BY MOUTH EVERY 6 HOURS AS NEEDED FOR ITCHING.    Dispense:  30 tablet    Refill:  2    Orders Placed This Encounter  Procedures  . Flu Vaccine QUAD 36+ mos IM  . POCT glucose (manual entry)  . POCT glycosylated hemoglobin (Hb A1C)  . POCT urinalysis dipstick    Referral Orders  No referral(s) requested today    Kathe Becton,  MSN, FNP-BC [facility] [address] [place], [place] [address] [phone] fax   Problem List Items Addressed This Visit      Other   OBESITY    Other Visit Diagnoses    Hospital discharge follow-Ryan    -  Primary   Allergic reaction, sequela       Relevant Medications   predniSONE (DELTASONE) 20 MG tablet   hydrOXYzine (ATARAX/VISTARIL) 25 MG tablet   Grieving       Pruritus       Relevant Medications   hydrOXYzine (ATARAX/VISTARIL) 25 MG tablet   Weight loss       Healthcare maintenance       Relevant Orders   POCT glucose (manual entry) (Completed)   POCT glycosylated hemoglobin (Hb A1C) (Completed)   POCT urinalysis dipstick (Completed)   Flu vaccine need       Relevant Orders   Flu Vaccine QUAD 36+ mos IM (Completed)   Follow Ryan          Meds ordered this encounter  Medications  . predniSONE (DELTASONE) 20 MG tablet    Sig: 2 tabs po daily x 4 days    Dispense:  8 tablet    Refill:  0  . hydrOXYzine (ATARAX/VISTARIL) 25 MG tablet    Sig: TAKE 1 TABLET BY MOUTH EVERY 6 HOURS AS NEEDED FOR ITCHING.    Dispense:  30 tablet    Refill:  2    Follow-Ryan: Return in about 3 months (around 09/10/2019).    [name], FNP

## 2019-06-12 LAB — URINE CULTURE

## 2019-06-22 ENCOUNTER — Other Ambulatory Visit: Payer: Self-pay | Admitting: Family Medicine

## 2019-06-22 DIAGNOSIS — J4520 Mild intermittent asthma, uncomplicated: Secondary | ICD-10-CM

## 2019-06-22 MED FILL — MONTELUKAST SOD 10 MG TAB: 10 | 30 days supply | Qty: 30 | Fill #2

## 2019-06-22 MED FILL — LISINOPRIL-HCTZ 20-25 MG TA: 20-25 | 30 days supply | Qty: 30 | Fill #5

## 2019-06-22 MED FILL — HYDROCORTISONE 2.5% CREAM: 2.5 | 15 days supply | Qty: 30 | Fill #0

## 2019-06-22 MED FILL — hydrOXYzine HCL 25 MG TABS: 25 | 7 days supply | Qty: 30 | Fill #1

## 2019-06-22 MED FILL — CETIRIZINE HCL 10 MG TABS: 10 | 30 days supply | Qty: 30 | Fill #0

## 2019-07-09 MED FILL — IBUPROFEN 800 MG TABLET: 800 | 10 days supply | Qty: 30 | Fill #3

## 2019-07-09 MED FILL — FUROSEMIDE 20 MG TABS: 20 | 30 days supply | Qty: 30 | Fill #3

## 2019-07-09 MED FILL — LISINOPRIL-HCTZ 20-25 MG TA: 20-25 | 30 days supply | Qty: 30 | Fill #5

## 2019-07-09 MED FILL — hydrOXYzine HCL 25 MG TABS: 25 | 7 days supply | Qty: 30 | Fill #1

## 2019-07-09 MED FILL — HYDROCORTISONE 2.5% CREAM: 2.5 | 15 days supply | Qty: 30 | Fill #0

## 2019-07-21 MED FILL — ALBUTEROL SULFATE HFA 108 (: 108 (90 BAS | 25 days supply | Qty: 9 | Fill #2

## 2019-07-22 ENCOUNTER — Other Ambulatory Visit: Payer: Self-pay | Admitting: Family Medicine

## 2019-07-22 DIAGNOSIS — M25561 Pain in right knee: Secondary | ICD-10-CM

## 2019-07-22 DIAGNOSIS — G8929 Other chronic pain: Secondary | ICD-10-CM

## 2019-07-22 DIAGNOSIS — M25562 Pain in left knee: Secondary | ICD-10-CM

## 2019-08-07 MED FILL — IBUPROFEN 800 MG TABLET: 800 | 10 days supply | Qty: 30 | Fill #4

## 2019-08-07 MED FILL — MONTELUKAST SOD 10 MG TAB: 10 | 30 days supply | Qty: 30 | Fill #2

## 2019-08-07 MED FILL — FUROSEMIDE 20 MG TABS: 20 | 30 days supply | Qty: 30 | Fill #4

## 2019-08-07 MED FILL — LISINOPRIL-HCTZ 20-25 MG TA: 20-25 | 30 days supply | Qty: 30 | Fill #6

## 2019-09-02 ENCOUNTER — Other Ambulatory Visit: Payer: Self-pay | Admitting: Family Medicine

## 2019-09-02 DIAGNOSIS — I10 Essential (primary) hypertension: Secondary | ICD-10-CM

## 2019-09-02 MED FILL — FUROSEMIDE 20 MG TABS: 20 | 30 days supply | Qty: 30 | Fill #5

## 2019-09-02 MED FILL — IBUPROFEN 800 MG TABLET: 800 | 10 days supply | Qty: 30 | Fill #5

## 2019-09-02 MED FILL — GABAPENTIN 300 MG CAPSULE: 300 | 30 days supply | Qty: 90 | Fill #3

## 2019-09-03 MED FILL — LISINOPRIL-HCTZ 20-25 MG TA: 20-25 | 30 days supply | Qty: 30 | Fill #0

## 2019-09-09 ENCOUNTER — Ambulatory Visit: Payer: Medicaid Other | Admitting: Family Medicine

## 2019-09-29 ENCOUNTER — Ambulatory Visit (INDEPENDENT_AMBULATORY_CARE_PROVIDER_SITE_OTHER): Payer: Medicaid Other | Admitting: Family Medicine

## 2019-09-29 ENCOUNTER — Other Ambulatory Visit: Payer: Self-pay

## 2019-09-29 ENCOUNTER — Encounter: Payer: Self-pay | Admitting: Family Medicine

## 2019-09-29 VITALS — BP 138/84 | HR 86 | Temp 98.7°F | Ht 61.0 in | Wt 258.4 lb

## 2019-09-29 DIAGNOSIS — F4321 Adjustment disorder with depressed mood: Secondary | ICD-10-CM | POA: Diagnosis not present

## 2019-09-29 DIAGNOSIS — Z09 Encounter for follow-up examination after completed treatment for conditions other than malignant neoplasm: Secondary | ICD-10-CM

## 2019-09-29 DIAGNOSIS — M25561 Pain in right knee: Secondary | ICD-10-CM

## 2019-09-29 DIAGNOSIS — M25562 Pain in left knee: Secondary | ICD-10-CM | POA: Diagnosis not present

## 2019-09-29 DIAGNOSIS — R52 Pain, unspecified: Secondary | ICD-10-CM | POA: Diagnosis not present

## 2019-09-29 DIAGNOSIS — Z6841 Body Mass Index (BMI) 40.0 and over, adult: Secondary | ICD-10-CM | POA: Diagnosis not present

## 2019-09-29 DIAGNOSIS — G8929 Other chronic pain: Secondary | ICD-10-CM

## 2019-09-29 DIAGNOSIS — E66813 Obesity, class 3: Secondary | ICD-10-CM

## 2019-09-29 DIAGNOSIS — J4520 Mild intermittent asthma, uncomplicated: Secondary | ICD-10-CM | POA: Diagnosis not present

## 2019-09-29 DIAGNOSIS — R8290 Unspecified abnormal findings in urine: Secondary | ICD-10-CM | POA: Diagnosis not present

## 2019-09-29 DIAGNOSIS — Z0000 Encounter for general adult medical examination without abnormal findings: Secondary | ICD-10-CM | POA: Diagnosis not present

## 2019-09-29 LAB — POCT URINALYSIS DIPSTICK
Bilirubin, UA: NEGATIVE
Glucose, UA: NEGATIVE
Ketones, UA: NEGATIVE
Nitrite, UA: NEGATIVE
Protein, UA: NEGATIVE
Spec Grav, UA: >=1.03 — AB (ref 1.010–1.025)
Urobilinogen, UA: 0.2 E.U./dL
pH, UA: 5 (ref 5.0–8.0)

## 2019-09-29 MED ORDER — CETIRIZINE HCL 10 MG PO TABS: 10.0000 mg | ORAL_TABLET | Freq: Every day | ORAL | 6 refills | Status: DC

## 2019-09-29 MED ORDER — IBUPROFEN 800 MG PO TABS: 800.0000 mg | ORAL_TABLET | Freq: Three times a day (TID) | ORAL | 5 refills | Status: DC

## 2019-09-29 MED FILL — CETIRIZINE HCL 10 MG TABS: 10 | 30 days supply | Qty: 30 | Fill #0

## 2019-09-29 MED FILL — IBUPROFEN 800 MG TABLET: 800 | 10 days supply | Qty: 30 | Fill #0

## 2019-09-29 NOTE — Progress Notes (Signed)
Patient Caroline Ryan    Established Patient Office Visit  Subjective:  Patient ID: Caroline Ryan, female    DOB: 1966-03-06  Age: 54 y.o. MRN: [id number]  CC:  Chief Complaint  Patient presents with  . Follow-up    HTN  . Knee Pain  . Weight Loss    HPI Caroline Ryan is a 54 year old female who presents for Follow Up today.   Past Medical History:  Diagnosis Date  . Anemia   . Asthma   . Depression    no meds  . Eczema   . Fibroid   . Headache(784.0)    otc prn med  . History of blood transfusion   . Hypertension   . Knee pain, bilateral    arthralgia knee pain bilateral  . Seasonal allergies   . SVD (spontaneous vaginal delivery)    x 3   Current Status: Since her last office visit, she is doing well with no complaints.  She denies visual changes, chest pain, cough, shortness of breath, heart palpitations, and falls. She has occasional headaches and dizziness with position changes. Denies severe headaches, confusion, seizures, double vision, and blurred vision, nausea and vomiting. Her anxiety is moderate today. She denies suicidal ideations, homicidal ideations, or auditory hallucinations. She continues to struggle towards her weight lose goal. She denies fevers, chills, fatigue, recent infections, weight loss, and night sweats. No reports of GI problems such as diarrhea, and constipation. She has no reports of blood in stools, dysuria and hematuria.She has bilateral knee pain.    Past Surgical History:  Procedure Laterality Date  . ABDOMINAL HYSTERECTOMY    . BILATERAL SALPINGECTOMY Bilateral 10/10/2017   Procedure: BILATERAL SALPINGECTOMY;  Surgeon: Chancy Milroy, MD;  Location: [facility];  Service: Gynecology;  Laterality: Bilateral;  . BREAST BIOPSY     US guided core 2009  . BREAST EXCISIONAL BIOPSY     right 1982 left 1981  . BREAST SURGERY     bilateral cysts/benign  . VAGINAL HYSTERECTOMY N/A 10/10/2017    Procedure: HYSTERECTOMY VAGINAL;  Surgeon: Chancy Milroy, MD;  Location: [facility];  Service: Gynecology;  Laterality: N/A;    Family History  Problem Relation Age of Onset  . Hypertension Father   . Diabetes Brother   . Diabetes Brother   . Breast cancer Mother 84  . Other Neg Hx     Social History   Socioeconomic History  . Marital status: Single    Spouse name: Not on file  . Number of children: Not on file  . Years of education: Not on file  . Highest education level: Not on file  Occupational History  . Not on file  Tobacco Use  . Smoking status: Former Smoker    Packs/day: 0.25    Years: 2.00    Pack years: 0.50    Types: Cigarettes    Quit date: 08/20/2014    Years since quitting: 5.1  . Smokeless tobacco: Never Used  Substance and Sexual Activity  . Alcohol use: Yes    Alcohol/week: 2.0 standard drinks    Types: 2 Glasses of wine per week  . Drug use: No  . Sexual activity: Yes    Birth control/protection: Condom  Other Topics Concern  . Not on file  Social History Narrative  . Not on file   Social Determinants of Health   Financial Resource Strain:   . Difficulty of Paying Living Expenses: Not  on file  Food Insecurity:   . Worried About Charity fundraiser in the Last Year: Not on file  . Ran Out of Food in the Last Year: Not on file  Transportation Needs:   . Lack of Transportation (Medical): Not on file  . Lack of Transportation (Non-Medical): Not on file  Physical Activity:   . Days of Exercise per Week: Not on file  . Minutes of Exercise per Session: Not on file  Stress:   . Feeling of Stress : Not on file  Social Connections:   . Frequency of Communication with Friends and Family: Not on file  . Frequency of Social Gatherings with Friends and Family: Not on file  . Attends Religious Services: Not on file  . Active Member of Clubs or Organizations: Not on file  . Attends Archivist Meetings: Not on file  . Marital Status: Not on  file  Intimate Partner Violence:   . Fear of Current or Ex-Partner: Not on file  . Emotionally Abused: Not on file  . Physically Abused: Not on file  . Sexually Abused: Not on file    Outpatient Medications Prior to Visit  Medication Sig Dispense Refill  . albuterol (VENTOLIN HFA) 108 (90 Base) MCG/ACT inhaler INHALE 2 PUFFS INTO THE LUNGS EVERY 6 HOURS AS NEEDED FOR WHEEZING OR SHORTNESS OF BREATH. ASTHMA ATTACKS 18 g 2  . diclofenac Sodium (VOLTAREN) 1 % GEL APPLY 2 G TOPICALLY 4 (FOUR) TIMES DAILY. 100 g 3  . diphenhydrAMINE (BENADRYL) 25 MG tablet Take 1 tablet (25 mg total) by mouth every 6 (six) hours as needed for itching. 20 tablet 0  . docusate sodium (COLACE) 100 MG capsule Take 1 capsule (100 mg total) by mouth 2 (two) times daily as needed. 30 capsule 2  . furosemide (LASIX) 20 MG tablet Take 1 tablet (20 mg total) by mouth daily. 30 tablet 6  . gabapentin (NEURONTIN) 300 MG capsule Take 1 capsule (300 mg total) by mouth 3 (three) times daily. 90 capsule 6  . hydrocortisone 2.5 % cream Apply topically 2 (two) times daily. 30 g 0  . hydrOXYzine (ATARAX/VISTARIL) 25 MG tablet TAKE 1 TABLET BY MOUTH EVERY 6 HOURS AS NEEDED FOR ITCHING. 30 tablet 2  . ibuprofen (ADVIL) 800 MG tablet TAKE 1 TABLET BY MOUTH 3 TIMES DAILY. 30 tablet 5  . lisinopril-hydrochlorothiazide (ZESTORETIC) 20-25 MG tablet TAKE 1 TABLET BY MOUTH DAILY. 30 tablet 6  . montelukast (SINGULAIR) 10 MG tablet Take 1 tablet (10 mg total) by mouth at bedtime. 30 tablet 6  . cetirizine (ZYRTEC) 10 MG tablet TAKE 1 TABLET (10 MG TOTAL) BY MOUTH DAILY. 30 tablet 3  . diclofenac (VOLTAREN) 75 MG EC tablet Take 1 tablet (75 mg total) by mouth 2 (two) times daily. 60 tablet 6  . predniSONE (DELTASONE) 20 MG tablet 2 tabs po daily x 4 days 8 tablet 0   No facility-administered medications prior to visit.    No Known Allergies  ROS Review of Systems  Constitutional: Negative.   HENT: Negative.   Eyes: Negative.    Respiratory: Positive for shortness of breath (on exertion).   Cardiovascular: Negative.   Gastrointestinal: Negative.   Endocrine: Negative.   Genitourinary: Negative.   Musculoskeletal: Positive for arthralgias (generalized joint pain; bilatera chronic knee pain).  Skin: Negative.   Allergic/Immunologic: Negative.   Neurological: Positive for dizziness (occasional ) and headaches (occasional ).  Hematological: Negative.   Psychiatric/Behavioral: Negative.  Objective:    Physical Exam  Constitutional: She is oriented to person, place, and time. She appears well-developed and well-nourished.  HENT:  Head: Normocephalic and atraumatic.  Eyes: Conjunctivae are normal.  Cardiovascular: Normal rate, regular rhythm, normal heart sounds and intact distal pulses.  Pulmonary/Chest: Effort normal and breath sounds normal.  Abdominal: Soft. Bowel sounds are normal. She exhibits distension (obese).  Musculoskeletal:        General: Normal range of motion.     Cervical back: Normal range of motion and neck supple.  Neurological: She is alert and oriented to person, place, and time. She has normal reflexes.  Skin: Skin is warm and dry.  Psychiatric: She has a normal mood and affect. Her behavior is normal. Judgment and thought content normal.  Nursing note and vitals reviewed.   BP (!) 153/76   Pulse 86   Temp 98.7 F (37.1 C)   Ht 5\' 1"  (1.549 m)   Wt 258 lb 6.4 oz (117.2 kg)   LMP 04/03/2017 (Approximate)   SpO2 99%   BMI 48.82 kg/m  Wt Readings from Last 3 Encounters:  09/29/19 258 lb 6.4 oz (117.2 kg)  06/10/19 249 lb 6.4 oz (113.1 kg)  05/30/19 160 lb (72.6 kg)     Health Maintenance Due  Topic Date Due  . COLONOSCOPY  02/07/2014    There are no preventive Ryan reminders to display for this patient.  Lab Results  Component Value Date   TSH 1.530 12/24/2018   Lab Results  Component Value Date   WBC 6.6 12/24/2018   HGB 11.4 12/24/2018   HCT 35.1  12/24/2018   MCV 94 12/24/2018   PLT 170 12/24/2018   Lab Results  Component Value Date   NA 142 12/24/2018   K 4.0 12/24/2018   CO2 26 12/24/2018   GLUCOSE 86 12/24/2018   BUN 20 12/24/2018   CREATININE 0.65 12/24/2018   BILITOT 0.3 12/24/2018   ALKPHOS 102 12/24/2018   AST 13 12/24/2018   ALT 17 12/24/2018   PROT 7.2 12/24/2018   ALBUMIN 4.4 12/24/2018   CALCIUM 10.2 12/24/2018   ANIONGAP 7 10/11/2017   Lab Results  Component Value Date   CHOL 180 12/24/2018   Lab Results  Component Value Date   HDL 72 12/24/2018   Lab Results  Component Value Date   LDLCALC 97 12/24/2018   Lab Results  Component Value Date   TRIG 57 12/24/2018   Lab Results  Component Value Date   CHOLHDL 2.5 12/24/2018   Lab Results  Component Value Date   HGBA1C 5.0 06/10/2019      Assessment & Plan:   1. Morbid obesity with BMI of 45.0-49.9, adult (HCC) Body mass index is 48.82 kg/m. Goal BMI  is <30. Encouraged efforts to reduce weight include engaging in physical activity as tolerated with goal of 150 minutes per week. Improve dietary choices and eat a meal regimen consistent with a Mediterranean or DASH diet. Reduce simple carbohydrates. Do not skip meals and eat healthy snacks throughout the day to avoid over-eating at dinner. Set a goal weight loss that is achievable for you.  2. Class 3 severe obesity due to excess calories with serious comorbidity and body mass index (BMI) of 45.0 to 49.9 in adult ([facility])  3. Chronic pain of both knees We will initiate Motrin today.  - ibuprofen (ADVIL) 800 MG tablet; Take 1 tablet (800 mg total) by mouth 3 (three) times daily.  Dispense: 30 tablet; Refill:  5  4. Pain aggravated by activities of daily living - ibuprofen (ADVIL) 800 MG tablet; Take 1 tablet (800 mg total) by mouth 3 (three) times daily.  Dispense: 30 tablet; Refill: 5  5. Mild intermittent asthma, unspecified whether complicated - cetirizine (ZYRTEC) 10 MG tablet; Take 1 tablet  (10 mg total) by mouth daily.  Dispense: 30 tablet; Refill: 6  6. Grieving She continues her grieving for her mother.   7. Abnormal urinalysis Results are pending.  - Urine Culture  8. Healthcare maintenance - POCT urinalysis dipstick  9. Follow up She will follow up in 3 months.   Meds ordered this encounter  Medications  . cetirizine (ZYRTEC) 10 MG tablet    Sig: Take 1 tablet (10 mg total) by mouth daily.    Dispense:  30 tablet    Refill:  6  . ibuprofen (ADVIL) 800 MG tablet    Sig: Take 1 tablet (800 mg total) by mouth 3 (three) times daily.    Dispense:  30 tablet    Refill:  5    Orders Placed This Encounter  Procedures  . Urine Culture  . POCT urinalysis dipstick    Referral Orders  No referral(s) requested today    Kathe Becton,  MSN, FNP-BC [place], [place] [address] [phone] fax    Problem List Items Addressed This Visit      Respiratory   Asthma   Relevant Medications   cetirizine (ZYRTEC) 10 MG tablet     Other   Grieving   OBESITY - Primary    Other Visit Diagnoses    Pain aggravated by activities of daily living       Chronic pain of both knees       Healthcare maintenance       Relevant Orders   POCT urinalysis dipstick (Completed)   Follow up          Meds ordered this encounter  Medications  . cetirizine (ZYRTEC) 10 MG tablet    Sig: Take 1 tablet (10 mg total) by mouth daily.    Dispense:  30 tablet    Refill:  6    Follow-up: Return in about 3 months (around 12/27/2019).    [name], FNP

## 2019-09-29 NOTE — Patient Instructions (Signed)
Calorie Counting for Weight Loss Calories are units of energy. Your body needs a certain amount of calories from food to keep you going throughout the day. When you eat more calories than your body needs, your body stores the extra calories as fat. When you eat fewer calories than your body needs, your body burns fat to get the energy it needs. Calorie counting means keeping track of how many calories you eat and drink each day. Calorie counting can be helpful if you need to lose weight. If you make sure to eat fewer calories than your body needs, you should lose weight. Ask your health care provider what a healthy weight is for you. For calorie counting to work, you will need to eat the right number of calories in a day in order to lose a healthy amount of weight per week. A dietitian can help you determine how many calories you need in a day and will give you suggestions on how to reach your calorie goal.  A healthy amount of weight to lose per week is usually 1-2 lb (0.5-0.9 kg). This usually means that your daily calorie intake should be reduced by 500-750 calories.  Eating 1,200 - 1,500 calories per day can help most women lose weight.  Eating 1,500 - 1,800 calories per day can help most men lose weight. What is my plan? My goal is to have __________ calories per day. If I have this many calories per day, I should lose around __________ pounds per week. What do I need to know about calorie counting? In order to meet your daily calorie goal, you will need to:  Find out how many calories are in each food you would like to eat. Try to do this before you eat.  Decide how much of the food you plan to eat.  Write down what you ate and how many calories it had. Doing this is called keeping a food log. To successfully lose weight, it is important to balance calorie counting with a healthy lifestyle that includes regular activity. Aim for 150 minutes of moderate exercise (such as walking) or 75  minutes of vigorous exercise (such as running) each week. Where do I find calorie information?  The number of calories in a food can be found on a Nutrition Facts label. If a food does not have a Nutrition Facts label, try to look up the calories online or ask your dietitian for help. Remember that calories are listed per serving. If you choose to have more than one serving of a food, you will have to multiply the calories per serving by the amount of servings you plan to eat. For example, the label on a package of bread might say that a serving size is 1 slice and that there are 90 calories in a serving. If you eat 1 slice, you will have eaten 90 calories. If you eat 2 slices, you will have eaten 180 calories. How do I keep a food log? Immediately after each meal, record the following information in your food log:  What you ate. Don't forget to include toppings, sauces, and other extras on the food.  How much you ate. This can be measured in cups, ounces, or number of items.  How many calories each food and drink had.  The total number of calories in the meal. Keep your food log near you, such as in a small notebook in your pocket, or use a mobile app or website. Some programs will calculate   calories for you and show you how many calories you have left for the day to meet your goal. What are some calorie counting tips?   Use your calories on foods and drinks that will fill you up and not leave you hungry: ? Some examples of foods that fill you up are nuts and nut butters, vegetables, lean proteins, and high-fiber foods like whole grains. High-fiber foods are foods with more than 5 g fiber per serving. ? Drinks such as sodas, specialty coffee drinks, alcohol, and juices have a lot of calories, yet do not fill you up.  Eat nutritious foods and avoid empty calories. Empty calories are calories you get from foods or beverages that do not have many vitamins or protein, such as candy, sweets, and  soda. It is better to have a nutritious high-calorie food (such as an avocado) than a food with few nutrients (such as a bag of chips).  Know how many calories are in the foods you eat most often. This will help you calculate calorie counts faster.  Pay attention to calories in drinks. Low-calorie drinks include water and unsweetened drinks.  Pay attention to nutrition labels for "low fat" or "fat free" foods. These foods sometimes have the same amount of calories or more calories than the full fat versions. They also often have added sugar, starch, or salt, to make up for flavor that was removed with the fat.  Find a way of tracking calories that works for you. Get creative. Try different apps or programs if writing down calories does not work for you. What are some portion control tips?  Know how many calories are in a serving. This will help you know how many servings of a certain food you can have.  Use a measuring cup to measure serving sizes. You could also try weighing out portions on a kitchen scale. With time, you will be able to estimate serving sizes for some foods.  Take some time to put servings of different foods on your favorite plates, bowls, and cups so you know what a serving looks like.  Try not to eat straight from a bag or box. Doing this can lead to overeating. Put the amount you would like to eat in a cup or on a plate to make sure you are eating the right portion.  Use smaller plates, glasses, and bowls to prevent overeating.  Try not to multitask (for example, watch TV or use your computer) while eating. If it is time to eat, sit down at a table and enjoy your food. This will help you to know when you are full. It will also help you to be aware of what you are eating and how much you are eating. What are tips for following this plan? Reading food labels  Check the calorie count compared to the serving size. The serving size may be smaller than what you are used to  eating.  Check the source of the calories. Make sure the food you are eating is high in vitamins and protein and low in saturated and trans fats. Shopping  Read nutrition labels while you shop. This will help you make healthy decisions before you decide to purchase your food.  Make a grocery list and stick to it. Cooking  Try to cook your favorite foods in a healthier way. For example, try baking instead of frying.  Use low-fat dairy products. Meal planning  Use more fruits and vegetables. Half of your plate should be fruits   and vegetables.  Include lean proteins like poultry and fish. How do I count calories when eating out?  Ask for smaller portion sizes.  Consider sharing an entree and sides instead of getting your own entree.  If you get your own entree, eat only half. Ask for a box at the beginning of your meal and put the rest of your entree in it so you are not tempted to eat it.  If calories are listed on the menu, choose the lower calorie options.  Choose dishes that include vegetables, fruits, whole grains, low-fat dairy products, and lean protein.  Choose items that are boiled, broiled, grilled, or steamed. Stay away from items that are buttered, battered, fried, or served with cream sauce. Items labeled "crispy" are usually fried, unless stated otherwise.  Choose water, low-fat milk, unsweetened iced tea, or other drinks without added sugar. If you want an alcoholic beverage, choose a lower calorie option such as a glass of wine or light beer.  Ask for dressings, sauces, and syrups on the side. These are usually high in calories, so you should limit the amount you eat.  If you want a salad, choose a garden salad and ask for grilled meats. Avoid extra toppings like bacon, cheese, or fried items. Ask for the dressing on the side, or ask for olive oil and vinegar or lemon to use as dressing.  Estimate how many servings of a food you are given. For example, a serving of  cooked rice is  cup or about the size of half a baseball. Knowing serving sizes will help you be aware of how much food you are eating at restaurants. The list below tells you how big or small some common portion sizes are based on everyday objects: ? 1 oz--4 stacked dice. ? 3 oz--1 deck of cards. ? 1 tsp--1 die. ? 1 Tbsp-- a ping-pong ball. ? 2 Tbsp--1 ping-pong ball. ?  cup-- baseball. ? 1 cup--1 baseball. Summary  Calorie counting means keeping track of how many calories you eat and drink each day. If you eat fewer calories than your body needs, you should lose weight.  A healthy amount of weight to lose per week is usually 1-2 lb (0.5-0.9 kg). This usually means reducing your daily calorie intake by 500-750 calories.  The number of calories in a food can be found on a Nutrition Facts label. If a food does not have a Nutrition Facts label, try to look up the calories online or ask your dietitian for help.  Use your calories on foods and drinks that will fill you up, and not on foods and drinks that will leave you hungry.  Use smaller plates, glasses, and bowls to prevent overeating. This information is not intended to replace advice given to you by your health care provider. Make sure you discuss any questions you have with your health care provider. Document Revised: 04/25/2018 Document Reviewed: 07/06/2016 Elsevier Patient Education  2020 [employer] DASH Eating Plan DASH stands for "Dietary Approaches to Stop Hypertension." The DASH eating plan is a healthy eating plan that has been shown to reduce high blood pressure (hypertension). It may also reduce your risk for type 2 diabetes, heart disease, and stroke. The DASH eating plan may also help with weight loss. What are tips for following this plan?  General guidelines  Avoid eating more than 2,300 mg (milligrams) of salt (sodium) a day. If you have hypertension, you may need to reduce your sodium intake to 1,500 mg   a  day.  Limit alcohol intake to no more than 1 drink a day for nonpregnant women and 2 drinks a day for men. One drink equals 12 oz of beer, 5 oz of wine, or 1 oz of hard liquor.  Work with your health care provider to maintain a healthy body weight or to lose weight. Ask what an ideal weight is for you.  Get at least 30 minutes of exercise that causes your heart to beat faster (aerobic exercise) most days of the week. Activities may include walking, swimming, or biking.  Work with your health care provider or diet and nutrition specialist (dietitian) to adjust your eating plan to your individual calorie needs. Reading food labels   Check food labels for the amount of sodium per serving. Choose foods with less than 5 percent of the Daily Value of sodium. Generally, foods with less than 300 mg of sodium per serving fit into this eating plan.  To find whole grains, look for the word "whole" as the first word in the ingredient list. Shopping  Buy products labeled as "low-sodium" or "no salt added."  Buy fresh foods. Avoid canned foods and premade or frozen meals. Cooking  Avoid adding salt when cooking. Use salt-free seasonings or herbs instead of table salt or sea salt. Check with your health care provider or pharmacist before using salt substitutes.  Do not fry foods. Cook foods using healthy methods such as baking, boiling, grilling, and broiling instead.  Cook with heart-healthy oils, such as olive, canola, soybean, or sunflower oil. Meal planning  Eat a balanced diet that includes: ? 5 or more servings of fruits and vegetables each day. At each meal, try to fill half of your plate with fruits and vegetables. ? Up to 6-8 servings of whole grains each day. ? Less than 6 oz of lean meat, poultry, or fish each day. A 3-oz serving of meat is about the same size as a deck of cards. One egg equals 1 oz. ? 2 servings of low-fat dairy each day. ? A serving of nuts, seeds, or beans 5 times  each week. ? Heart-healthy fats. Healthy fats called Omega-3 fatty acids are found in foods such as flaxseeds and coldwater fish, like sardines, salmon, and mackerel.  Limit how much you eat of the following: ? Canned or prepackaged foods. ? Food that is high in trans fat, such as fried foods. ? Food that is high in saturated fat, such as fatty meat. ? Sweets, desserts, sugary drinks, and other foods with added sugar. ? Full-fat dairy products.  Do not salt foods before eating.  Try to eat at least 2 vegetarian meals each week.  Eat more home-cooked food and less restaurant, buffet, and fast food.  When eating at a restaurant, ask that your food be prepared with less salt or no salt, if possible. What foods are recommended? The items listed may not be a complete list. Talk with your dietitian about what dietary choices are best for you. Grains Whole-grain or whole-wheat bread. Whole-grain or whole-wheat pasta. Brown rice. Oatmeal. Quinoa. Bulgur. Whole-grain and low-sodium cereals. Pita bread. Low-fat, low-sodium crackers. Whole-wheat flour tortillas. Vegetables Fresh or frozen vegetables (raw, steamed, roasted, or grilled). Low-sodium or reduced-sodium tomato and vegetable juice. Low-sodium or reduced-sodium tomato sauce and tomato paste. Low-sodium or reduced-sodium canned vegetables. Fruits All fresh, dried, or frozen fruit. Canned fruit in natural juice (without added sugar). Meat and other protein foods Skinless chicken or turkey. Ground chicken or turkey.   Pork with fat trimmed off. Fish and seafood. Egg whites. Dried beans, peas, or lentils. Unsalted nuts, nut butters, and seeds. Unsalted canned beans. Lean cuts of beef with fat trimmed off. Low-sodium, lean deli meat. Dairy Low-fat (1%) or fat-free (skim) milk. Fat-free, low-fat, or reduced-fat cheeses. Nonfat, low-sodium ricotta or cottage cheese. Low-fat or nonfat yogurt. Low-fat, low-sodium cheese. Fats and oils Soft margarine  without trans fats. Vegetable oil. Low-fat, reduced-fat, or light mayonnaise and salad dressings (reduced-sodium). Canola, safflower, olive, soybean, and sunflower oils. Avocado. Seasoning and other foods Herbs. Spices. Seasoning mixes without salt. Unsalted popcorn and pretzels. Fat-free sweets. What foods are not recommended? The items listed may not be a complete list. Talk with your dietitian about what dietary choices are best for you. Grains Baked goods made with fat, such as croissants, muffins, or some breads. Dry pasta or rice meal packs. Vegetables Creamed or fried vegetables. Vegetables in a cheese sauce. Regular canned vegetables (not low-sodium or reduced-sodium). Regular canned tomato sauce and paste (not low-sodium or reduced-sodium). Regular tomato and vegetable juice (not low-sodium or reduced-sodium). Pickles. Olives. Fruits Canned fruit in a light or heavy syrup. Fried fruit. Fruit in cream or butter sauce. Meat and other protein foods Fatty cuts of meat. Ribs. Fried meat. Bacon. Sausage. Bologna and other processed lunch meats. Salami. Fatback. Hotdogs. Bratwurst. Salted nuts and seeds. Canned beans with added salt. Canned or smoked fish. Whole eggs or egg yolks. Chicken or turkey with skin. Dairy Whole or 2% milk, cream, and half-and-half. Whole or full-fat cream cheese. Whole-fat or sweetened yogurt. Full-fat cheese. Nondairy creamers. Whipped toppings. Processed cheese and cheese spreads. Fats and oils Butter. Stick margarine. Lard. Shortening. Ghee. Bacon fat. Tropical oils, such as coconut, palm kernel, or palm oil. Seasoning and other foods Salted popcorn and pretzels. Onion salt, garlic salt, seasoned salt, table salt, and sea salt. Worcestershire sauce. Tartar sauce. Barbecue sauce. Teriyaki sauce. Soy sauce, including reduced-sodium. Steak sauce. Canned and packaged gravies. Fish sauce. Oyster sauce. Cocktail sauce. Horseradish that you find on the shelf. Ketchup.  Mustard. Meat flavorings and tenderizers. Bouillon cubes. Hot sauce and Tabasco sauce. Premade or packaged marinades. Premade or packaged taco seasonings. Relishes. Regular salad dressings. Where to find more information:  National Heart, Lung, and Blood Institute: www.nhlbi.nih.gov  American Heart Association: www.heart.org Summary  The DASH eating plan is a healthy eating plan that has been shown to reduce high blood pressure (hypertension). It may also reduce your risk for type 2 diabetes, heart disease, and stroke.  With the DASH eating plan, you should limit salt (sodium) intake to 2,300 mg a day. If you have hypertension, you may need to reduce your sodium intake to 1,500 mg a day.  When on the DASH eating plan, aim to eat more fresh fruits and vegetables, whole grains, lean proteins, low-fat dairy, and heart-healthy fats.  Work with your health care provider or diet and nutrition specialist (dietitian) to adjust your eating plan to your individual calorie needs. This information is not intended to replace advice given to you by your health care provider. Make sure you discuss any questions you have with your health care provider. Document Revised: 07/19/2017 Document Reviewed: 07/30/2016 Elsevier Patient Education  2020 [employer] Inc[employer]  

## 2019-10-01 LAB — URINE CULTURE

## 2019-10-09 MED FILL — CETIRIZINE HCL 10 MG TABS: 10 | 30 days supply | Qty: 30 | Fill #0

## 2019-10-09 MED FILL — IBUPROFEN 800 MG TABLET: 800 | 10 days supply | Qty: 30 | Fill #0

## 2019-10-09 MED FILL — GABAPENTIN 300 MG CAPSULE: 300 | 30 days supply | Qty: 90 | Fill #4

## 2019-10-14 ENCOUNTER — Telehealth: Payer: Self-pay | Admitting: Family Medicine

## 2019-10-14 ENCOUNTER — Telehealth: Payer: Self-pay

## 2019-10-14 ENCOUNTER — Other Ambulatory Visit: Payer: Self-pay | Admitting: Family Medicine

## 2019-10-14 DIAGNOSIS — M25561 Pain in right knee: Secondary | ICD-10-CM

## 2019-10-14 DIAGNOSIS — M25462 Effusion, left knee: Secondary | ICD-10-CM

## 2019-10-14 DIAGNOSIS — M25461 Effusion, right knee: Secondary | ICD-10-CM

## 2019-10-14 DIAGNOSIS — Z9181 History of falling: Secondary | ICD-10-CM

## 2019-10-14 DIAGNOSIS — M25562 Pain in left knee: Secondary | ICD-10-CM

## 2019-10-14 NOTE — Telephone Encounter (Signed)
Patient  is aware of X-Ray order and hours of operation.    She is requesting a referral to Ortho. Per patient "I don't have transportaion to get an xray. Why can't I just get a referral."

## 2019-10-14 NOTE — Telephone Encounter (Signed)
Message left for call back 

## 2019-10-14 NOTE — Telephone Encounter (Signed)
-----   Message from [name], [place] sent at 10/14/2019 11:44 AM EST ----- Regarding: "Knee Pain" Orders placed for STAT bilateral knee X-rays. Please inform patient to report to Radiology at [facility] for scans. Thank you.

## 2019-10-14 NOTE — Telephone Encounter (Signed)
Pt fell and hit knees. Now her knees are swollen. Please call pt back.

## 2019-10-14 NOTE — Telephone Encounter (Signed)
Patient fell on 2/19 and hurt both knees. Knees are swollen she thinks something must have cracked. There has been a decease in swelling since the fall.   She declined to come today due to transportation. She declined to be seen at Urgent Care  She is aware you are out of the office for 2 days.   Patient wants a referral to [facility] Fax [phone].

## 2019-10-15 ENCOUNTER — Telehealth: Payer: Self-pay | Admitting: Family Medicine

## 2019-10-15 NOTE — Telephone Encounter (Signed)
Pt called requesting a referral to [facility] for her swollen knees. Pt states they can take her on Monday. She just needs the referral.

## 2019-10-15 NOTE — Telephone Encounter (Signed)
Patient has not went for her X-ray. Today she is still  requesting the referral.

## 2019-10-16 ENCOUNTER — Other Ambulatory Visit: Payer: Self-pay | Admitting: Family Medicine

## 2019-10-16 DIAGNOSIS — M25461 Effusion, right knee: Secondary | ICD-10-CM

## 2019-10-16 DIAGNOSIS — G8929 Other chronic pain: Secondary | ICD-10-CM

## 2019-10-16 DIAGNOSIS — Z6841 Body Mass Index (BMI) 40.0 and over, adult: Secondary | ICD-10-CM

## 2019-10-22 ENCOUNTER — Ambulatory Visit: Payer: Self-pay

## 2019-10-22 ENCOUNTER — Other Ambulatory Visit: Payer: Self-pay

## 2019-10-22 ENCOUNTER — Encounter: Payer: Self-pay | Admitting: Orthopaedic Surgery

## 2019-10-22 ENCOUNTER — Ambulatory Visit (INDEPENDENT_AMBULATORY_CARE_PROVIDER_SITE_OTHER): Payer: Medicaid Other | Admitting: Orthopaedic Surgery

## 2019-10-22 ENCOUNTER — Ambulatory Visit (INDEPENDENT_AMBULATORY_CARE_PROVIDER_SITE_OTHER): Payer: Medicaid Other

## 2019-10-22 VITALS — Ht 60.63 in | Wt 261.6 lb

## 2019-10-22 DIAGNOSIS — M1712 Unilateral primary osteoarthritis, left knee: Secondary | ICD-10-CM | POA: Insufficient documentation

## 2019-10-22 DIAGNOSIS — M25561 Pain in right knee: Secondary | ICD-10-CM | POA: Diagnosis not present

## 2019-10-22 DIAGNOSIS — M25562 Pain in left knee: Secondary | ICD-10-CM

## 2019-10-22 DIAGNOSIS — Z6841 Body Mass Index (BMI) 40.0 and over, adult: Secondary | ICD-10-CM | POA: Insufficient documentation

## 2019-10-22 DIAGNOSIS — M1711 Unilateral primary osteoarthritis, right knee: Secondary | ICD-10-CM

## 2019-10-22 DIAGNOSIS — G8929 Other chronic pain: Secondary | ICD-10-CM | POA: Diagnosis not present

## 2019-10-22 MED ORDER — METHYLPREDNISOLONE ACETATE 40 MG/ML IJ SUSP
40.0000 mg | INTRAMUSCULAR | Status: AC | PRN
  Administered 2019-10-22: 40 mg via INTRA_ARTICULAR

## 2019-10-22 MED ORDER — LIDOCAINE HCL 1 % IJ SOLN
3.0000 mL | INTRAMUSCULAR | Status: AC | PRN
  Administered 2019-10-22: 3 mL

## 2019-10-22 NOTE — Progress Notes (Signed)
Office Visit Note   Patient: Caroline Ryan           Date of Birth: 17-Jul-1966           MRN: [id number] Visit Date: 10/22/2019              Requested by: [name], [place],  [place] [address] PCP: [name], FNP   Assessment & Plan: Visit Diagnoses:  1. Chronic pain of right knee   2. Chronic pain of left knee   3. Unilateral primary osteoarthritis, left knee   4. Unilateral primary osteoarthritis, right knee   5. BMI 50.0-59.9, adult ([place])     Plan: She understands that her BMI is contributing significantly to the pain in her knees and I recommended weight loss and quad shaving exercises.  Also feel that she could benefit from injections of steroid in both knees since she is not a diabetic and she has never had these before.  She agreed with this treatment plan and did tolerate the steroid injections well in both knees.  I did demonstrate quad strengthening exercises I wonder try on her own at home.  All questions and concerns were answered and addressed.  I would like to see her back in 3 months for repeat weight and BMI calculation.  Follow-Up Instructions: Return in about 3 months (around 01/22/2020).   Orders:  Orders Placed This Encounter  Procedures   Large Joint Inj   Large Joint Inj   XR Knee 1-2 Views Right   XR Knee 1-2 Views Left   No orders of the defined types were placed in this encounter.     Procedures: Large Joint Inj: R knee on 10/22/2019 4:10 PM Indications: diagnostic evaluation and pain Details: 22 G 1.5 in needle, superolateral approach  Arthrogram: No  Medications: 3 mL lidocaine 1 %; 40 mg methylPREDNISolone acetate 40 MG/ML Outcome: tolerated well, no immediate complications Procedure, treatment alternatives, risks and benefits explained, specific risks discussed. Consent was given by the patient. Immediately prior to procedure a time out was called to verify the correct patient, procedure, equipment,  support staff and site/side marked as required. Patient was prepped and draped in the usual sterile fashion.   Large Joint Inj: L knee on 10/22/2019 4:10 PM Indications: diagnostic evaluation and pain Details: 22 G 1.5 in needle, superolateral approach  Arthrogram: No  Medications: 3 mL lidocaine 1 %; 40 mg methylPREDNISolone acetate 40 MG/ML Outcome: tolerated well, no immediate complications Procedure, treatment alternatives, risks and benefits explained, specific risks discussed. Consent was given by the patient. Immediately prior to procedure a time out was called to verify the correct patient, procedure, equipment, support staff and site/side marked as required. Patient was prepped and draped in the usual sterile fashion.       Clinical Data: No additional findings.   Subjective: Chief Complaint  Patient presents with   Right Knee - Pain   Left Knee - Pain  The patient is very pleasant 54 year old female who comes in with a chief complaint of bilateral knee pain is been getting worse for several years now.  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  She has had significant weight gains and weight loss.  Her weight is up.  Her BMI today is 50.03.  She has generalized knee pain with both knees with both knees.  She says is worse to walk and going up and down steps.  She gets a  lot of tightness and clicking and grinding in her knees with swelling as well.  She is not a diabetic.  Her weight fluctuates due to inactivity she states.  I was able to see hemoglobin A1c of 5.1 on her chart.  She has never had injections in either knee.  She has never had surgery on her knees and reports no injuries that she is aware of.  She does have severe pain daily.  At this point her knee pain is definitely affecting her mobility, her quality of life, and her activities daily living.  HPI  Review of Systems She currently denies any headache, chest pain, shortness of breath, fever, chills, nausea,  vomiting  Objective: Vital Signs: Ht 5' 0.63" (1.54 m)    Wt 261 lb 9.6 oz (118.7 kg)    LMP 04/03/2017 (Approximate)    BMI 50.03 kg/m   Physical Exam She is alert and orient x3 and in no acute distress Ortho Exam Examination of both knees show significant varus malalignment.  She does have both knees when she walks.  She walks with a significant limp.  Both knees are ligamentously stable and have full range of motion.  She has significant medial joint line tenderness with both knees. Specialty Comments:  No specialty comments available.  Imaging: XR Knee 1-2 Views Left  Result Date: 10/22/2019 2 views of the left knee show severe end-stage arthritis with varus malalignment.  There is complete loss of the medial joint space and the patellofemoral joint with large osteophytes in all 3 compartments.  The varus alignment is significant.  XR Knee 1-2 Views Right  Result Date: 10/22/2019 2 views of the right knee show severe varus malalignment.  There is significant end-stage arthritis with complete loss of the medial compartment joint space in the patellofemoral joint.  There are large osteophytes throughout the knee.    PMFS History: Patient Active Problem List   Diagnosis Date Noted   Unilateral primary osteoarthritis, left knee 10/22/2019   Unilateral primary osteoarthritis, right knee 10/22/2019   BMI 50.0-59.9, adult ([place]) 10/22/2019   Grieving 06/10/2019   Pruritus 06/10/2019   Bilateral lower extremity edema 11/02/2018   Post-operative state 10/10/2017   Symptomatic anemia 05/20/2017   Arthralgia of both knees 06/27/2015   BACK STRAIN, LUMBAR 03/16/2010   KNEE PAIN, BILATERAL 06/30/2009   OBESITY 04/13/2009   EDEMA 04/13/2009   FIBROADENOMA, BREAST 04/20/2008   HYPERTENSION, BENIGN ESSENTIAL 04/14/2008   Allergic rhinitis 04/14/2008   Asthma 04/14/2008   Past Medical History:  Diagnosis Date   Anemia    Asthma    Depression    no meds   Eczema     Fibroid    Headache(784.0)    otc prn med   History of blood transfusion    Hypertension    Knee pain, bilateral    arthralgia knee pain bilateral   Seasonal allergies    SVD (spontaneous vaginal delivery)    x 3    Family History  Problem Relation Age of Onset   Hypertension Father    Diabetes Brother    Diabetes Brother    Breast cancer Mother 101   Other Neg Hx     Past Surgical History:  Procedure Laterality Date   ABDOMINAL HYSTERECTOMY     BILATERAL SALPINGECTOMY Bilateral 10/10/2017   Procedure: BILATERAL SALPINGECTOMY;  Surgeon: Chancy Milroy, MD;  Location: [facility];  Service: Gynecology;  Laterality: Bilateral;   BREAST BIOPSY     US guided core  2009   BREAST EXCISIONAL BIOPSY     right 1982 left 1981   BREAST SURGERY     bilateral cysts/benign   VAGINAL HYSTERECTOMY N/A 10/10/2017   Procedure: HYSTERECTOMY VAGINAL;  Surgeon: Chancy Milroy, MD;  Location: [facility];  Service: Gynecology;  Laterality: N/A;   Social History   Occupational History   Not on file  Tobacco Use   Smoking status: Former Smoker    Packs/day: 0.25    Years: 2.00    Pack years: 0.50    Types: Cigarettes    Quit date: 08/20/2014    Years since quitting: 5.1   Smokeless tobacco: Never Used  Substance and Sexual Activity   Alcohol use: Yes    Alcohol/week: 2.0 standard drinks    Types: 2 Glasses of wine per week   Drug use: No   Sexual activity: Yes    Birth control/protection: Condom

## 2019-10-26 MED FILL — LISINOPRIL-HCTZ 20-25 MG TA: 20-25 | 90 days supply | Qty: 90 | Fill #1

## 2019-10-26 MED FILL — IBUPROFEN 800 MG TABLET: 800 | 30 days supply | Qty: 90 | Fill #1

## 2019-10-26 MED FILL — MONTELUKAST SOD 10 MG TAB: 10 | 90 days supply | Qty: 90 | Fill #3

## 2019-11-03 MED FILL — hydrOXYzine HCL 25 MG TABS: 25 | 7 days supply | Qty: 30 | Fill #2

## 2019-11-03 MED FILL — CETIRIZINE HCL 10 MG TABS: 10 | 30 days supply | Qty: 30 | Fill #1

## 2019-11-03 MED FILL — FUROSEMIDE 20 MG TABS: 20 | 30 days supply | Qty: 30 | Fill #6

## 2019-12-07 ENCOUNTER — Ambulatory Visit (INDEPENDENT_AMBULATORY_CARE_PROVIDER_SITE_OTHER): Payer: Medicaid Other | Admitting: Physician Assistant

## 2019-12-07 ENCOUNTER — Other Ambulatory Visit: Payer: Self-pay

## 2019-12-07 ENCOUNTER — Encounter: Payer: Self-pay | Admitting: Physician Assistant

## 2019-12-07 VITALS — Ht 60.63 in | Wt 260.4 lb

## 2019-12-07 DIAGNOSIS — Z6841 Body Mass Index (BMI) 40.0 and over, adult: Secondary | ICD-10-CM | POA: Diagnosis not present

## 2019-12-07 DIAGNOSIS — G8929 Other chronic pain: Secondary | ICD-10-CM | POA: Diagnosis not present

## 2019-12-07 DIAGNOSIS — M25561 Pain in right knee: Secondary | ICD-10-CM

## 2019-12-07 DIAGNOSIS — M25562 Pain in left knee: Secondary | ICD-10-CM | POA: Diagnosis not present

## 2019-12-07 NOTE — Progress Notes (Signed)
Office Visit Note   Patient: Caroline Ryan           Date of Birth: January 21, 1966           MRN: [id number] Visit Date: 12/07/2019              Requested by: [name], [address] [place],  [place] [address] PCP: [name], FNP   Assessment & Plan: Visit Diagnoses:  1. BMI 50.0-59.9, adult ([place])   2. Chronic pain of right knee   3. Chronic pain of left knee     Plan: Patient will like some type of relief for knee pain.  Recommended she continue use Voltaren gel for her knees.  Also continue to work on strengthening particularly quad strengthening both knees.  We will send her for referral for weight loss.   She is not a candidate for supplemental injections of either knee.   Follow-Up Instructions: Return in about 6 weeks (around 01/18/2020).   Orders:  Orders Placed This Encounter  Procedures  . Amb Ref to Medical Weight Management   No orders of the defined types were placed in this encounter.     Procedures: No procedures performed   Clinical Data: No additional findings.   Subjective: Chief Complaint  Patient presents with  . Right Knee - Follow-up  . Left Knee - Follow-up    HPI Patient returns today mainly for weight check.  She states she is trying to work out with the pain in her knees so bad that she has difficulty working out.  She has also adjusted her eating habits.She ask about any other injections in her knees to ease the pain. Had bilateral knee injections with cortisone 10/22/19 with minimal relief.   Review of Systems Negative for fevers , chills or chest pain. See HPI.   Objective: Vital Signs: Ht 5' 0.63" (1.54 m)   Wt 260 lb 6.4 oz (118.1 kg)   LMP 04/03/2017 (Approximate)   BMI 49.80 kg/m   Physical Exam Constitutional:      Appearance: She is not ill-appearing or diaphoretic.  Neurological:     Mental Status: She is alert.  Psychiatric:        Mood and Affect: Mood normal.     Ortho Exam Walks  with an antalgic gait. Varus deformities of both knees.  Specialty Comments:  No specialty comments available.  Imaging: No results found.   PMFS History: Patient Active Problem List   Diagnosis Date Noted  . Unilateral primary osteoarthritis, left knee 10/22/2019  . Unilateral primary osteoarthritis, right knee 10/22/2019  . BMI 50.0-59.9, adult ([place]) 10/22/2019  . Grieving 06/10/2019  . Pruritus 06/10/2019  . Bilateral lower extremity edema 11/02/2018  . Post-operative state 10/10/2017  . Symptomatic anemia 05/20/2017  . Arthralgia of both knees 06/27/2015  . BACK STRAIN, LUMBAR 03/16/2010  . KNEE PAIN, BILATERAL 06/30/2009  . OBESITY 04/13/2009  . EDEMA 04/13/2009  . [place], BREAST 04/20/2008  . HYPERTENSION, BENIGN ESSENTIAL 04/14/2008  . Allergic rhinitis 04/14/2008  . Asthma 04/14/2008   Past Medical History:  Diagnosis Date  . Anemia   . Asthma   . Depression    no meds  . Eczema   . Fibroid   . Headache(784.0)    otc prn med  . History of blood transfusion   . Hypertension   . Knee pain, bilateral    arthralgia knee pain bilateral  . Seasonal allergies   . SVD (spontaneous vaginal  delivery)    x 3    Family History  Problem Relation Age of Onset  . Hypertension Father   . Diabetes Brother   . Diabetes Brother   . Breast cancer Mother 53  . Other Neg Hx     Past Surgical History:  Procedure Laterality Date  . ABDOMINAL HYSTERECTOMY    . BILATERAL SALPINGECTOMY Bilateral 10/10/2017   Procedure: BILATERAL SALPINGECTOMY;  Surgeon: Chancy Milroy, MD;  Location: [facility];  Service: Gynecology;  Laterality: Bilateral;  . BREAST BIOPSY     US guided core 2009  . BREAST EXCISIONAL BIOPSY     right 1982 left 1981  . BREAST SURGERY     bilateral cysts/benign  . VAGINAL HYSTERECTOMY N/A 10/10/2017   Procedure: HYSTERECTOMY VAGINAL;  Surgeon: Chancy Milroy, MD;  Location: [facility];  Service: Gynecology;  Laterality: N/A;   Social History    Occupational History  . Not on file  Tobacco Use  . Smoking status: Former Smoker    Packs/day: 0.25    Years: 2.00    Pack years: 0.50    Types: Cigarettes    Quit date: 08/20/2014    Years since quitting: 5.3  . Smokeless tobacco: Never Used  Substance and Sexual Activity  . Alcohol use: Yes    Alcohol/week: 2.0 standard drinks    Types: 2 Glasses of wine per week  . Drug use: No  . Sexual activity: Yes    Birth control/protection: Condom

## 2019-12-24 ENCOUNTER — Ambulatory Visit: Payer: Medicaid Other

## 2019-12-31 ENCOUNTER — Other Ambulatory Visit: Payer: Self-pay | Admitting: Family Medicine

## 2019-12-31 DIAGNOSIS — J4520 Mild intermittent asthma, uncomplicated: Secondary | ICD-10-CM

## 2019-12-31 NOTE — Telephone Encounter (Signed)
Requesting Albuterol. No follow up appointment. Please advise.

## 2020-01-04 ENCOUNTER — Ambulatory Visit (INDEPENDENT_AMBULATORY_CARE_PROVIDER_SITE_OTHER): Payer: Medicaid Other | Admitting: Physician Assistant

## 2020-01-04 ENCOUNTER — Other Ambulatory Visit: Payer: Self-pay

## 2020-01-04 ENCOUNTER — Encounter: Payer: Self-pay | Admitting: Physician Assistant

## 2020-01-04 VITALS — Ht 59.45 in | Wt 253.6 lb

## 2020-01-04 DIAGNOSIS — M1712 Unilateral primary osteoarthritis, left knee: Secondary | ICD-10-CM

## 2020-01-04 DIAGNOSIS — M1711 Unilateral primary osteoarthritis, right knee: Secondary | ICD-10-CM | POA: Diagnosis not present

## 2020-01-04 MED ORDER — METHYLPREDNISOLONE ACETATE 40 MG/ML IJ SUSP
40.0000 mg | INTRAMUSCULAR | Status: AC | PRN
  Administered 2020-01-04: 40 mg via INTRA_ARTICULAR

## 2020-01-04 MED ORDER — LIDOCAINE HCL 1 % IJ SOLN
0.5000 mL | INTRAMUSCULAR | Status: AC | PRN
  Administered 2020-01-04: .5 mL

## 2020-01-04 NOTE — Progress Notes (Signed)
Office Visit Note   Patient: Caroline Ryan           Date of Birth: 1966-07-13           MRN: [id number] Visit Date: 01/04/2020              Requested by: [name], [place],  [place] [address] PCP: [name], FNP   Assessment & Plan: Visit Diagnoses:  1. Primary osteoarthritis of right knee   2. Primary osteoarthritis of left knee     Plan: She will continue to work on weight loss.  She understands to wait at least 3 months between cortisone injections.  We will see her back in 6 weeks for height and weight check.  Questions were encouraged and answered.  Follow-Up Instructions: Return in about 6 weeks (around 02/15/2020).   Orders:  No orders of the defined types were placed in this encounter.  No orders of the defined types were placed in this encounter.     Procedures: Large Joint Inj: bilateral knee on 01/04/2020 12:08 PM Indications: pain Details: 22 G 1.5 in needle, anterolateral approach  Arthrogram: No  Medications (Right): 0.5 mL lidocaine 1 %; 40 mg methylPREDNISolone acetate 40 MG/ML Medications (Left): 0.5 mL lidocaine 1 %; 40 mg methylPREDNISolone acetate 40 MG/ML Outcome: tolerated well, no immediate complications Procedure, treatment alternatives, risks and benefits explained, specific risks discussed. Consent was given by the patient. Immediately prior to procedure a time out was called to verify the correct patient, procedure, equipment, support staff and site/side marked as required. Patient was prepped and draped in the usual sterile fashion.       Clinical Data: No additional findings.   Subjective: Chief Complaint  Patient presents with  . Right Knee - Pain  . Left Knee - Pain    HPI Caroline Ryan returns today requesting bilateral knee injections.  Last injections were 10/22/2019.  She states she is trying to lose weight by walking and diet.  She did contact the weight management center did not have  the money for weight clinic.  She states that both knees give out at times.  She has had no new injury. Review of Systems See HPI otherwise negative or noncontributory.  Objective: Vital Signs: Ht 4' 11.45" (1.51 m)   Wt 253 lb 9.6 oz (115 kg)   LMP 04/03/2017 (Approximate)   BMI 50.45 kg/m   Physical Exam Constitutional:      Appearance: She is not ill-appearing or diaphoretic.  Neurological:     Mental Status: She is alert and oriented to person, place, and time.  Psychiatric:        Behavior: Behavior normal.     Ortho Exam Bilateral knees significant crepitus with passive range of motion.  No abnormal warmth erythema.  Good range of motion of both knees otherwise. Specialty Comments:  No specialty comments available.  Imaging: No results found.   PMFS History: Patient Active Problem List   Diagnosis Date Noted  . Unilateral primary osteoarthritis, left knee 10/22/2019  . Unilateral primary osteoarthritis, right knee 10/22/2019  . BMI 50.0-59.9, adult ([place]) 10/22/2019  . Grieving 06/10/2019  . Pruritus 06/10/2019  . Bilateral lower extremity edema 11/02/2018  . Post-operative state 10/10/2017  . Symptomatic anemia 05/20/2017  . Arthralgia of both knees 06/27/2015  . BACK STRAIN, LUMBAR 03/16/2010  . KNEE PAIN, BILATERAL 06/30/2009  . OBESITY 04/13/2009  . EDEMA 04/13/2009  . [place], BREAST 04/20/2008  .  HYPERTENSION, BENIGN ESSENTIAL 04/14/2008  . Allergic rhinitis 04/14/2008  . Asthma 04/14/2008   Past Medical History:  Diagnosis Date  . Anemia   . Asthma   . Depression    no meds  . Eczema   . Fibroid   . Headache(784.0)    otc prn med  . History of blood transfusion   . Hypertension   . Knee pain, bilateral    arthralgia knee pain bilateral  . Seasonal allergies   . SVD (spontaneous vaginal delivery)    x 3    Family History  Problem Relation Age of Onset  . Hypertension Father   . Diabetes Brother   . Diabetes Brother   . Breast  cancer Mother 53  . Other Neg Hx     Past Surgical History:  Procedure Laterality Date  . ABDOMINAL HYSTERECTOMY    . BILATERAL SALPINGECTOMY Bilateral 10/10/2017   Procedure: BILATERAL SALPINGECTOMY;  Surgeon: Chancy Milroy, MD;  Location: [facility];  Service: Gynecology;  Laterality: Bilateral;  . BREAST BIOPSY     US guided core 2009  . BREAST EXCISIONAL BIOPSY     right 1982 left 1981  . BREAST SURGERY     bilateral cysts/benign  . VAGINAL HYSTERECTOMY N/A 10/10/2017   Procedure: HYSTERECTOMY VAGINAL;  Surgeon: Chancy Milroy, MD;  Location: [facility];  Service: Gynecology;  Laterality: N/A;   Social History   Occupational History  . Not on file  Tobacco Use  . Smoking status: Former Smoker    Packs/day: 0.25    Years: 2.00    Pack years: 0.50    Types: Cigarettes    Quit date: 08/20/2014    Years since quitting: 5.3  . Smokeless tobacco: Never Used  Substance and Sexual Activity  . Alcohol use: Yes    Alcohol/week: 2.0 standard drinks    Types: 2 Glasses of wine per week  . Drug use: No  . Sexual activity: Yes    Birth control/protection: Condom

## 2020-01-22 ENCOUNTER — Other Ambulatory Visit: Payer: Self-pay

## 2020-01-22 ENCOUNTER — Ambulatory Visit (INDEPENDENT_AMBULATORY_CARE_PROVIDER_SITE_OTHER): Payer: Medicaid Other | Admitting: Nurse Practitioner

## 2020-01-22 ENCOUNTER — Other Ambulatory Visit: Payer: Self-pay | Admitting: Nurse Practitioner

## 2020-01-22 ENCOUNTER — Encounter: Payer: Self-pay | Admitting: Nurse Practitioner

## 2020-01-22 VITALS — BP 163/86 | HR 90 | Temp 98.0°F | Ht 59.5 in | Wt 258.0 lb

## 2020-01-22 DIAGNOSIS — G8929 Other chronic pain: Secondary | ICD-10-CM | POA: Diagnosis not present

## 2020-01-22 DIAGNOSIS — M170 Bilateral primary osteoarthritis of knee: Secondary | ICD-10-CM | POA: Diagnosis not present

## 2020-01-22 DIAGNOSIS — M25561 Pain in right knee: Secondary | ICD-10-CM

## 2020-01-22 DIAGNOSIS — M25562 Pain in left knee: Secondary | ICD-10-CM | POA: Diagnosis not present

## 2020-01-22 MED ORDER — HYDROCODONE-ACETAMINOPHEN 10-325 MG PO TABS: 1.0000 | ORAL_TABLET | Freq: Three times a day (TID) | ORAL | 0 refills | Status: DC | PRN

## 2020-01-22 NOTE — Patient Instructions (Signed)

## 2020-01-22 NOTE — Progress Notes (Signed)
° °  [place], [place]  [address] Phone:  [phone]   Fax:  [phone]  Subjective:    Caroline Ryan is a 54 y.o. female who presents with knee pain involving both knees. Onset was gradual, starting about several months ago. Inciting event: this is a longstanding problem which has been getting worse. Current symptoms include: pain located enitre knee. Pain is aggravated by any weight bearing, going up and down stairs and walking. Patient has had prior knee problems. Evaluation to date: plain films: abnormal tricompartmental OA. Treatment to date: corticosteroid injection which was somewhat effective and prescription NSAIDS which are not very effective. She has been followed by ortho.  She is need of knee replacement. She is losing weight.She was instructed to come to PCP for hydrocodone/APAP 10/325mg . She is using the [employer]. She admits that she is not wanting to over use the [employer]. She is needing a referral to pain management. She is walking with her daughter and she working in her vegetable garden. She has lost 7 pounds with her recent efforts.   The following portions of the patient's history were reviewed and updated as appropriate: allergies, current medications, past family history, past medical history, past social history, past surgical history and problem list.   Review of Systems Pertinent items are noted in HPI.   Objective:    BP (!) 163/86 (BP Location: Left Arm, Patient Position: Sitting, Cuff Size: Normal)    Pulse 90    Temp 98 F (36.7 C)    Ht 4' 11.5" (1.511 m)    Wt 258 lb (117 kg)    LMP 04/03/2017 (Approximate)    SpO2 99%    BMI 51.24 kg/m  Right knee: positive exam findings: medial joint line tenderness and lateral joint line tenderness  Left knee:  positive exam findings: effusion, medial joint line tenderness and lateral joint line tenderness   X-ray both knees: shows DJD changes, likely chronic    Assessment:    Bilateral Severe osteoarthritis bilaterally L>R   Plan:    Natural history and expected course discussed. Questions answered. Scientist, clinical (histocompatibility and immunogenetics) distributed. Patellar compression sleeve. NSAIDs per medication orders. PT referral. Pain mgmt and one time RX for Norco 10/325mg  provided

## 2020-01-22 NOTE — Progress Notes (Signed)
   [address] [place], [place]  [address] Phone:  [phone]   Fax:  [phone]   Hydrocodone/acetaminophen 10/325 prescription resent to [facility] per patient request

## 2020-01-25 ENCOUNTER — Other Ambulatory Visit: Payer: Self-pay | Admitting: Nurse Practitioner

## 2020-01-25 ENCOUNTER — Encounter: Payer: Self-pay | Admitting: Physical Medicine and Rehabilitation

## 2020-01-25 ENCOUNTER — Ambulatory Visit: Payer: Medicaid Other | Admitting: Orthopaedic Surgery

## 2020-01-25 DIAGNOSIS — M170 Bilateral primary osteoarthritis of knee: Secondary | ICD-10-CM

## 2020-01-25 DIAGNOSIS — G8929 Other chronic pain: Secondary | ICD-10-CM

## 2020-01-28 ENCOUNTER — Other Ambulatory Visit: Payer: Self-pay | Admitting: Family Medicine

## 2020-01-28 DIAGNOSIS — M25562 Pain in left knee: Secondary | ICD-10-CM

## 2020-01-28 DIAGNOSIS — R600 Localized edema: Secondary | ICD-10-CM

## 2020-01-29 ENCOUNTER — Other Ambulatory Visit: Payer: Self-pay | Admitting: Family Medicine

## 2020-01-29 NOTE — Telephone Encounter (Signed)
Is this okay to refill pt hasn't had this since 2020.

## 2020-02-01 MED FILL — FUROSEMIDE 20 MG TABS: 20 | 30 days supply | Qty: 30 | Fill #0

## 2020-02-09 ENCOUNTER — Ambulatory Visit: Payer: Medicaid Other | Admitting: Family Medicine

## 2020-02-11 ENCOUNTER — Ambulatory Visit (INDEPENDENT_AMBULATORY_CARE_PROVIDER_SITE_OTHER): Payer: Medicaid Other | Admitting: Orthopaedic Surgery

## 2020-02-11 ENCOUNTER — Other Ambulatory Visit: Payer: Self-pay

## 2020-02-11 ENCOUNTER — Encounter: Payer: Self-pay | Admitting: Orthopaedic Surgery

## 2020-02-11 DIAGNOSIS — M1711 Unilateral primary osteoarthritis, right knee: Secondary | ICD-10-CM | POA: Diagnosis not present

## 2020-02-11 DIAGNOSIS — Z6841 Body Mass Index (BMI) 40.0 and over, adult: Secondary | ICD-10-CM

## 2020-02-11 DIAGNOSIS — M1712 Unilateral primary osteoarthritis, left knee: Secondary | ICD-10-CM

## 2020-02-11 NOTE — Progress Notes (Signed)
The patient comes in today requesting steroid injections in both of her knees.  However she just had this done in both knees only 5 weeks ago.  She is someone with a BMI of around 50.  She has severe arthritis in both her knees.  She says the injections were very helpful and she is exercising more and trying to lose weight and she is not in as much pain as she was before.  I had a long and thorough discussion with her about the fact that she needs to wait at least 2 more months before the steroid injections again in her knees.  I recommended glucosamine and/or turmeric.  I have also praised her for working on increasing activities and losing weight.  We would like to see her back in 2 months for repeat steroid injections in both knees.  At that visit we need her weight and BMI calculated.

## 2020-02-16 ENCOUNTER — Encounter
Payer: Medicaid Other | Attending: Physical Medicine and Rehabilitation | Admitting: Physical Medicine and Rehabilitation

## 2020-02-16 ENCOUNTER — Encounter: Payer: Self-pay | Admitting: Physical Medicine and Rehabilitation

## 2020-02-16 ENCOUNTER — Other Ambulatory Visit: Payer: Self-pay

## 2020-02-16 VITALS — BP 149/96 | HR 84 | Temp 97.3°F | Ht 59.5 in | Wt 260.4 lb

## 2020-02-16 DIAGNOSIS — M1711 Unilateral primary osteoarthritis, right knee: Secondary | ICD-10-CM | POA: Diagnosis not present

## 2020-02-16 DIAGNOSIS — M25561 Pain in right knee: Secondary | ICD-10-CM

## 2020-02-16 DIAGNOSIS — Z6841 Body Mass Index (BMI) 40.0 and over, adult: Secondary | ICD-10-CM | POA: Diagnosis not present

## 2020-02-16 DIAGNOSIS — M1712 Unilateral primary osteoarthritis, left knee: Secondary | ICD-10-CM | POA: Diagnosis not present

## 2020-02-16 DIAGNOSIS — R252 Cramp and spasm: Secondary | ICD-10-CM

## 2020-02-16 DIAGNOSIS — M25562 Pain in left knee: Secondary | ICD-10-CM | POA: Diagnosis not present

## 2020-02-16 NOTE — Progress Notes (Signed)
Subjective:    Patient ID: Caroline Ryan, female    DOB: November 22, 1965, 54 y.o.   MRN: [id number]  HPI Caroline Ryan is a 54 year old woman who presents to establish care for bilateral knee pain and muscle cramps.  Last night she had bilateral cramps and she is not sure why she had this- it was in both thighs. She has not had her magnesium level checked or a recent BMP.  She has been getting steroid injections for her bilateral knees every 3 months which does provide relief.   She has been told that she should get knee replacements but does not want to pursue this. She has never tried viscosupplementation.   She has also been trying to lose weight but she does feel she ate more recently for her birthday. She loves vegetables. She likes to cook them with olive oil. She is currently 260 lbs.   Pain Inventory Average Pain 10 Pain Right Now 9 My pain is intermittent, sharp, burning, tingling and aching  In the last 24 hours, has pain interfered with the following? General activity 7 Relation with others 9 Enjoyment of life 10 What TIME of day is your pain at its worst? Evening & Night Sleep (in general) Poor  Pain is worse with: walking, sitting and some activites Pain improves with: heat/ice, medication and injections Relief from Meds: 10  Mobility walk without assistance how many minutes can you walk? 10-15 mins ability to climb steps?  yes do you drive?  no Do you have any goals in this area?  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 Do you have any goals in this area?  yes  Neuro/Psych depression  Prior Studies Any changes since last visit?  no NEW PATIENT  Physicians involved in your care Any changes since last visit?  no New Patient   Family History  Problem Relation Age of Onset  . Hypertension Father   . Diabetes Brother   . Diabetes Brother   . Breast  cancer Mother 39  . Other Neg Hx    Social History   Socioeconomic History  . Marital status: Single    Spouse name: Not on file  . Number of children: Not on file  . Years of education: Not on file  . Highest education level: Not on file  Occupational History  . Not on file  Tobacco Use  . Smoking status: Former Smoker    Packs/day: 0.25    Years: 2.00    Pack years: 0.50    Types: Cigarettes    Quit date: 08/20/2014    Years since quitting: 5.4  . Smokeless tobacco: Never Used  Vaping Use  . Vaping Use: Never used  Substance and Sexual Activity  . Alcohol use: Yes    Alcohol/week: 2.0 standard drinks    Types: 2 Glasses of wine per week  . Drug use: No  . Sexual activity: Yes    Birth control/protection: Condom  Other Topics Concern  . Not on file  Social History Narrative  . Not on file   Social Determinants of Health   Financial Resource Strain:   . Difficulty of Paying Living Expenses:   Food Insecurity:   . Worried About Charity fundraiser in the Last Year:   . Arboriculturist in the Last Year:   Transportation Needs:   .  Lack of Transportation (Medical):   Marland Kitchen Lack of Transportation (Non-Medical):   Physical Activity:   . Days of Exercise per Week:   . Minutes of Exercise per Session:   Stress:   . Feeling of Stress :   Social Connections:   . Frequency of Communication with Friends and Family:   . Frequency of Social Gatherings with Friends and Family:   . Attends Religious Services:   . Active Member of Clubs or Organizations:   . Attends Archivist Meetings:   Marland Kitchen Marital Status:    Past Surgical History:  Procedure Laterality Date  . ABDOMINAL HYSTERECTOMY    . BILATERAL SALPINGECTOMY Bilateral 10/10/2017   Procedure: BILATERAL SALPINGECTOMY;  Surgeon: Chancy Milroy, MD;  Location: [facility];  Service: Gynecology;  Laterality: Bilateral;  . BREAST BIOPSY     US guided core 2009  . BREAST EXCISIONAL BIOPSY     right 1982 left 1981  .  BREAST SURGERY     bilateral cysts/benign  . VAGINAL HYSTERECTOMY N/A 10/10/2017   Procedure: HYSTERECTOMY VAGINAL;  Surgeon: Chancy Milroy, MD;  Location: [facility];  Service: Gynecology;  Laterality: N/A;   Past Medical History:  Diagnosis Date  . Anemia   . Asthma   . Depression    no meds  . Eczema   . Fibroid   . Headache(784.0)    otc prn med  . History of blood transfusion   . Hypertension   . Knee pain, bilateral    arthralgia knee pain bilateral  . Seasonal allergies   . SVD (spontaneous vaginal delivery)    x 3   BP (!) 149/96   Pulse 84   Temp (!) 97.3 F (36.3 C)   Ht 4' 11.5" (1.511 m)   Wt 260 lb 6.4 oz (118.1 kg)   LMP 04/03/2017 (Approximate)   SpO2 97%   BMI 51.71 kg/m   Opioid Risk Score:   Fall Risk Score:  `1  Depression screen PHQ 2/9  Depression screen [facility] 2/9 02/16/2020 01/22/2020 09/29/2019 12/24/2018 10/31/2018 06/09/2018 03/17/2018  Decreased Interest 1 0 1 0 0 0 1  Down, Depressed, Hopeless 1 1 1  0 0 2 1  PHQ - 2 Score 2 1 2  0 0 2 2  Altered sleeping 0 - 1 - - 2 2  Tired, decreased energy 1 - 3 - - 2 1  Change in appetite 0 - 1 - - 0 0  Feeling bad or failure about yourself  0 - 1 - - 0 0  Trouble concentrating 0 - 0 - - 0 0  Moving slowly or fidgety/restless 0 - 3 - - 1 -  Suicidal thoughts 0 - 0 - - 0 0  PHQ-9 Score 3 - 11 - - 7 5  Difficult doing work/chores - - - - - - -  Some recent data might be hidden   Review of Systems  Constitutional: Negative.   HENT: Negative.   Eyes: Negative.   Respiratory: Negative.   Cardiovascular: Negative.   Gastrointestinal: Negative.   Endocrine: Negative.   Genitourinary: Negative.   Musculoskeletal: Positive for gait problem and joint swelling.       Knees pain Spasms & cramps  Skin: Negative.   Allergic/Immunologic: Negative.   Hematological: Negative.   Psychiatric/Behavioral: Negative.        Objective:   Physical Exam  Gen: no distress, normal appearing HEENT: oral mucosa pink and  moist, NCAT Cardio: Reg rate Chest: normal effort,  normal rate of breathing Abd: soft, non-distended, obese.  Ext: no edema Skin: intact Neuro: Alert and oriented x3. Musculoskeletal: tenderness to palpation over bilateral knees. Antalgic gait.  Psych: pleasant, normal affect      Assessment & Plan:  Caroline Ryan is a 54 year old woman presenting to establish care for bilateral chronic knee pain.  1) Bilateral knee OA: -XRs reviewed and consistent with OA -She received steroid injections q21months. -Advised regarding viscosupplementation and she is interested. Will obtain prior authorization and return for injections, bilateral in 2 weeks.  -Will obtain Vitamin D level.  2) Muscle cramps: will obtain magnesium level and BMP. Will call with results tomorrow.  3) Morbid obesity: discussed intermittent fasting and low carb diet. She eats very healthy! Made goal to start eating at 11:15am instead of 11am and to move dinner time from 8:30pm to 8:15pm.   All questions answered. RTC in 2 weeks for bilateral viscosupplementation injections.

## 2020-02-17 LAB — BASIC METABOLIC PANEL
BUN/Creatinine Ratio: 42 — ABNORMAL HIGH (ref 9–23)
BUN: 33 mg/dL — ABNORMAL HIGH (ref 6–24)
CO2: 26 mmol/L (ref 20–29)
Calcium: 10.5 mg/dL — ABNORMAL HIGH (ref 8.7–10.2)
Chloride: 105 mmol/L (ref 96–106)
Creatinine, Ser: 0.78 mg/dL (ref 0.57–1.00)
GFR calc Af Amer: 98 mL/min/{1.73_m2} (ref >59)
GFR calc non Af Amer: 85 mL/min/{1.73_m2} (ref >59)
Glucose: 108 mg/dL — ABNORMAL HIGH (ref 65–99)
Potassium: 4.5 mmol/L (ref 3.5–5.2)
Sodium: 142 mmol/L (ref 134–144)

## 2020-02-17 LAB — VITAMIN D 25 HYDROXY (VIT D DEFICIENCY, FRACTURES): Vit D, 25-Hydroxy: 43 ng/mL (ref 30.0–100.0)

## 2020-02-17 LAB — MAGNESIUM: Magnesium: 2.2 mg/dL (ref 1.6–2.3)

## 2020-02-19 ENCOUNTER — Other Ambulatory Visit: Payer: Self-pay | Admitting: Family Medicine

## 2020-02-19 ENCOUNTER — Ambulatory Visit (INDEPENDENT_AMBULATORY_CARE_PROVIDER_SITE_OTHER): Payer: Medicaid Other | Admitting: Family Medicine

## 2020-02-19 ENCOUNTER — Encounter: Payer: Self-pay | Admitting: Family Medicine

## 2020-02-19 ENCOUNTER — Other Ambulatory Visit: Payer: Self-pay

## 2020-02-19 VITALS — BP 143/76 | HR 102 | Temp 97.9°F | Ht 59.5 in | Wt 256.6 lb

## 2020-02-19 DIAGNOSIS — J4520 Mild intermittent asthma, uncomplicated: Secondary | ICD-10-CM

## 2020-02-19 DIAGNOSIS — Z09 Encounter for follow-up examination after completed treatment for conditions other than malignant neoplasm: Secondary | ICD-10-CM

## 2020-02-19 DIAGNOSIS — M25562 Pain in left knee: Secondary | ICD-10-CM | POA: Diagnosis not present

## 2020-02-19 DIAGNOSIS — M170 Bilateral primary osteoarthritis of knee: Secondary | ICD-10-CM

## 2020-02-19 DIAGNOSIS — M25561 Pain in right knee: Secondary | ICD-10-CM

## 2020-02-19 DIAGNOSIS — G8929 Other chronic pain: Secondary | ICD-10-CM

## 2020-02-19 DIAGNOSIS — Z6841 Body Mass Index (BMI) 40.0 and over, adult: Secondary | ICD-10-CM

## 2020-02-19 MED ORDER — ALBUTEROL SULFATE HFA 108 (90 BASE) MCG/ACT IN AERS: INHALATION_SPRAY | RESPIRATORY_TRACT | 12 refills | Status: DC

## 2020-02-19 MED ORDER — GABAPENTIN 300 MG PO CAPS: 300.0000 mg | ORAL_CAPSULE | Freq: Three times a day (TID) | ORAL | 6 refills | Status: DC

## 2020-02-19 MED ORDER — HYDROCODONE-ACETAMINOPHEN 10-325 MG PO TABS: 1.0000 | ORAL_TABLET | Freq: Three times a day (TID) | ORAL | 0 refills | Status: DC | PRN

## 2020-02-19 MED FILL — GABAPENTIN 300 MG CAPSULE: 300 | 30 days supply | Qty: 90 | Fill #0

## 2020-02-19 MED FILL — ALBUTEROL SULFATE HFA 108 (: 108 (90 BAS | 25 days supply | Qty: 18 | Fill #0

## 2020-02-19 NOTE — Progress Notes (Signed)
Patient Caroline Ryan   Established Patient Office Visit  Subjective:  Patient ID: Caroline Ryan, female    DOB: 05-Jan-1966  Age: 54 y.o. MRN: [id number]  CC:  Chief Complaint  Patient presents with  . Follow-up    seeing p/t helping  with pain, still having left knee     HPI Caroline Ryan is a 54 year old female who presents for Follow Up today.    Patient Active Problem List   Diagnosis Date Noted  . Unilateral primary osteoarthritis, left knee 10/22/2019  . Unilateral primary osteoarthritis, right knee 10/22/2019  . BMI 50.0-59.9, adult ([place]) 10/22/2019  . Grieving 06/10/2019  . Pruritus 06/10/2019  . Bilateral lower extremity edema 11/02/2018  . Post-operative state 10/10/2017  . Symptomatic anemia 05/20/2017  . Arthralgia of both knees 06/27/2015  . BACK STRAIN, LUMBAR 03/16/2010  . KNEE PAIN, BILATERAL 06/30/2009  . OBESITY 04/13/2009  . Edema 04/13/2009  . [place], BREAST 04/20/2008  . HYPERTENSION, BENIGN ESSENTIAL 04/14/2008  . Allergic rhinitis 04/14/2008  . Asthma 04/14/2008    Past Medical History:  Diagnosis Date  . Anemia   . Asthma   . Depression    no meds  . Eczema   . Fibroid   . Headache(784.0)    otc prn med  . History of blood transfusion   . Hypertension   . Knee pain, bilateral    arthralgia knee pain bilateral  . Seasonal allergies   . SVD (spontaneous vaginal delivery)    x 3   Current Status: Since her last office visit, she denies visual changes, chest pain, cough, shortness of breath, heart palpitations, and falls. She has occasional headaches and dizziness with position changes. Denies severe headaches, confusion, seizures, double vision, and blurred vision, nausea and vomiting. She has bilateral chronic knee pain. She continues to follow up with Physical Therapy, where she receives cortisone in her knees. She states that she does get a few days relief of pain. She denies  fevers, chills, fatigue, recent infections, weight loss, and night sweats.  Denies GI problems such as nausea, vomiting, diarrhea, and constipation. She has no reports of blood in stools, dysuria and hematuria. No depression or anxiety reported today. She is taking all medications as prescribed.   Past Surgical History:  Procedure Laterality Date  . ABDOMINAL HYSTERECTOMY    . BILATERAL SALPINGECTOMY Bilateral 10/10/2017   Procedure: BILATERAL SALPINGECTOMY;  Surgeon: Chancy Milroy, MD;  Location: [facility];  Service: Gynecology;  Laterality: Bilateral;  . BREAST BIOPSY     US guided core 2009  . BREAST EXCISIONAL BIOPSY     right 1982 left 1981  . BREAST SURGERY     bilateral cysts/benign  . VAGINAL HYSTERECTOMY N/A 10/10/2017   Procedure: HYSTERECTOMY VAGINAL;  Surgeon: Chancy Milroy, MD;  Location: [facility];  Service: Gynecology;  Laterality: N/A;    Family History  Problem Relation Age of Onset  . Hypertension Father   . Diabetes Brother   . Diabetes Brother   . Breast cancer Mother 69  . Other Neg Hx     Social History   Socioeconomic History  . Marital status: Single    Spouse name: Not on file  . Number of children: Not on file  . Years of education: Not on file  . Highest education level: Not on file  Occupational History  . Not on file  Tobacco Use  . Smoking status: Former Smoker  Packs/day: 0.25    Years: 2.00    Pack years: 0.50    Types: Cigarettes    Quit date: 08/20/2014    Years since quitting: 5.5  . Smokeless tobacco: Never Used  Vaping Use  . Vaping Use: Never used  Substance and Sexual Activity  . Alcohol use: Yes    Alcohol/week: 2.0 standard drinks    Types: 2 Glasses of wine per week  . Drug use: No  . Sexual activity: Yes    Birth control/protection: Condom  Other Topics Concern  . Not on file  Social History Narrative  . Not on file   Social Determinants of Health   Financial Resource Strain:   . Difficulty of Paying Living  Expenses:   Food Insecurity:   . Worried About Charity fundraiser in the Last Year:   . Arboriculturist in the Last Year:   Transportation Needs:   . Film/video editor (Medical):   Marland Kitchen Lack of Transportation (Non-Medical):   Physical Activity:   . Days of Exercise per Week:   . Minutes of Exercise per Session:   Stress:   . Feeling of Stress :   Social Connections:   . Frequency of Communication with Friends and Family:   . Frequency of Social Gatherings with Friends and Family:   . Attends Religious Services:   . Active Member of Clubs or Organizations:   . Attends Archivist Meetings:   Marland Kitchen Marital Status:   Intimate Partner Violence:   . Fear of Current or Ex-Partner:   . Emotionally Abused:   Marland Kitchen Physically Abused:   . Sexually Abused:     Outpatient Medications Prior to Visit  Medication Sig Dispense Refill  . cetirizine (ZYRTEC) 10 MG tablet Take 1 tablet (10 mg total) by mouth daily. 30 tablet 6  . diclofenac Sodium (VOLTAREN) 1 % GEL APPLY 2 G TOPICALLY 4 (FOUR) TIMES DAILY. 100 g 3  . diphenhydrAMINE (BENADRYL) 25 MG tablet Take 1 tablet (25 mg total) by mouth every 6 (six) hours as needed for itching. 20 tablet 0  . docusate sodium (COLACE) 100 MG capsule Take 1 capsule (100 mg total) by mouth 2 (two) times daily as needed. 30 capsule 2  . furosemide (LASIX) 20 MG tablet TAKE 1 TABLET (20 MG TOTAL) BY MOUTH DAILY. 30 tablet 6  . hydrocortisone 2.5 % cream Apply topically 2 (two) times daily. 30 g 0  . hydrOXYzine (ATARAX/VISTARIL) 25 MG tablet TAKE 1 TABLET BY MOUTH EVERY 6 HOURS AS NEEDED FOR ITCHING. 30 tablet 2  . lisinopril-hydrochlorothiazide (ZESTORETIC) 20-25 MG tablet TAKE 1 TABLET BY MOUTH DAILY. 30 tablet 6  . montelukast (SINGULAIR) 10 MG tablet Take 1 tablet (10 mg total) by mouth at bedtime. 30 tablet 6  . albuterol (VENTOLIN HFA) 108 (90 Base) MCG/ACT inhaler INHALE 2 PUFFS INTO THE LUNGS EVERY 6 HOURS AS NEEDED FOR WHEEZING OR SHORTNESS OF  BREATH. ASTHMA ATTACKS 8.5 g 2  . gabapentin (NEURONTIN) 300 MG capsule TAKE 1 CAPSULE (300 MG TOTAL) BY MOUTH 3 (THREE) TIMES DAILY. 90 capsule 6  . ibuprofen (ADVIL) 800 MG tablet Take 1 tablet (800 mg total) by mouth 3 (three) times daily. (Patient not taking: Reported on 02/16/2020) 30 tablet 5   No facility-administered medications prior to visit.    No Known Allergies  ROS Review of Systems  Constitutional: Negative.   HENT: Negative.   Eyes: Negative.   Respiratory: Negative.   Cardiovascular: Negative.  Gastrointestinal: Negative.   Endocrine: Negative.   Genitourinary: Negative.   Musculoskeletal: Positive for arthralgias (chronic bilateral knee pain).       Bilateral right knee pain  Skin: Negative.   Allergic/Immunologic: Negative.   Neurological: Positive for dizziness (occasional ) and headaches (occasional ).  Hematological: Negative.   Psychiatric/Behavioral: Negative.       Objective:    Physical Exam Vitals and nursing note reviewed.  Constitutional:      Appearance: Normal appearance. She is obese.  HENT:     Head: Normocephalic and atraumatic.     Nose: Nose normal.     Mouth/Throat:     Mouth: Mucous membranes are moist.     Pharynx: Oropharynx is clear.  Cardiovascular:     Rate and Rhythm: Normal rate and regular rhythm.     Pulses: Normal pulses.     Heart sounds: Normal heart sounds.  Pulmonary:     Effort: Pulmonary effort is normal.     Breath sounds: Normal breath sounds.  Abdominal:     General: Bowel sounds are normal.     Palpations: Abdomen is soft.  Musculoskeletal:        General: Swelling (bilateral lower extremity ) present. Normal range of motion.     Cervical back: Normal range of motion and neck supple.     Comments: Decreased ROM in bilateral knees.   Skin:    General: Skin is warm and dry.  Neurological:     General: No focal deficit present.     Mental Status: She is alert and oriented to person, place, and time.    Psychiatric:        Mood and Affect: Mood normal.        Behavior: Behavior normal.        Thought Content: Thought content normal.        Judgment: Judgment normal.     BP (!) 143/76 (BP Location: Right Arm, Patient Position: Sitting)   Pulse (!) 102   Temp 97.9 F (36.6 C) (Temporal)   Ht 4' 11.5" (1.511 m)   Wt 256 lb 9.6 oz (116.4 kg)   LMP 04/03/2017 (Approximate)   SpO2 98%   BMI 50.96 kg/m  Wt Readings from Last 3 Encounters:  02/19/20 256 lb 9.6 oz (116.4 kg)  02/16/20 260 lb 6.4 oz (118.1 kg)  01/22/20 258 lb (117 kg)     Health Maintenance Due  Topic Date Due  . COLONOSCOPY  Never done  . PAP SMEAR-Modifier  02/22/2020    There are no preventive Ryan reminders to display for this patient.  Lab Results  Component Value Date   TSH 1.530 12/24/2018   Lab Results  Component Value Date   WBC 6.6 12/24/2018   HGB 11.4 12/24/2018   HCT 35.1 12/24/2018   MCV 94 12/24/2018   PLT 170 12/24/2018   Lab Results  Component Value Date   NA 142 02/16/2020   K 4.5 02/16/2020   CO2 26 02/16/2020   GLUCOSE 108 (H) 02/16/2020   BUN 33 (H) 02/16/2020   CREATININE 0.78 02/16/2020   BILITOT 0.3 12/24/2018   ALKPHOS 102 12/24/2018   AST 13 12/24/2018   ALT 17 12/24/2018   PROT 7.2 12/24/2018   ALBUMIN 4.4 12/24/2018   CALCIUM 10.5 (H) 02/16/2020   ANIONGAP 7 10/11/2017   Lab Results  Component Value Date   CHOL 180 12/24/2018   Lab Results  Component Value Date   HDL 72 12/24/2018  Lab Results  Component Value Date   LDLCALC 97 12/24/2018   Lab Results  Component Value Date   TRIG 57 12/24/2018   Lab Results  Component Value Date   CHOLHDL 2.5 12/24/2018   Lab Results  Component Value Date   HGBA1C 5.0 06/10/2019      Assessment & Plan:   1. Mild intermittent asthma, unspecified whether complicated Stable. No signs or symptoms or respiratory distress noted or reported today.  - albuterol (VENTOLIN HFA) 108 (90 Base) MCG/ACT inhaler;  Inhale 1 puff daily, every 6 hours as needed for shortness of breath.  Dispense: 18 g; Refill: 12  2. Bilateral chronic knee pain - gabapentin (NEURONTIN) 300 MG capsule; Take 1 capsule (300 mg total) by mouth 3 (three) times daily.  Dispense: 90 capsule; Refill: 6  3. Chronic pain of both knees L>R - HYDROcodone-acetaminophen (NORCO) 10-325 MG tablet; Take 1 tablet by mouth every 8 (eight) hours as needed for up to 10 days.  Dispense: 30 tablet; Refill: 0  4. Tricompartment osteoarthritis of both knees - HYDROcodone-acetaminophen (NORCO) 10-325 MG tablet; Take 1 tablet by mouth every 8 (eight) hours as needed for up to 10 days.  Dispense: 30 tablet; Refill: 0  5. Morbid obesity with BMI of 45.0-49.9, adult ([facility]) Body mass index is 50.96 kg/m.  Goal BMI  is <30. Encouraged efforts to reduce weight include engaging in physical activity as tolerated with goal of 150 minutes per week. Improve dietary choices and eat a meal regimen consistent with a Mediterranean or DASH diet. Reduce simple carbohydrates. Do not skip meals and eat healthy snacks throughout the day to avoid over-eating at dinner. Set a goal weight loss that is achievable for you.   6. Follow up She will follow up in 3 months.   Meds ordered this encounter  Medications  . albuterol (VENTOLIN HFA) 108 (90 Base) MCG/ACT inhaler    Sig: Inhale 1 puff daily, every 6 hours as needed for shortness of breath.    Dispense:  18 g    Refill:  12  . gabapentin (NEURONTIN) 300 MG capsule    Sig: Take 1 capsule (300 mg total) by mouth 3 (three) times daily.    Dispense:  90 capsule    Refill:  6  . HYDROcodone-acetaminophen (NORCO) 10-325 MG tablet    Sig: Take 1 tablet by mouth every 8 (eight) hours as needed for up to 10 days.    Dispense:  30 tablet    Refill:  0   No orders of the defined types were placed in this encounter.   Referral Orders  No referral(s) requested today   Kathe Becton,  MSN, FNP-BC [place] [facility]  [facility] [address] [place], [place] [address] [phone] fax  Problem List Items Addressed This Visit      Respiratory   Asthma - Primary   Relevant Medications   albuterol (VENTOLIN HFA) 108 (90 Base) MCG/ACT inhaler    Other Visit Diagnoses    Bilateral chronic knee pain       Relevant Medications   gabapentin (NEURONTIN) 300 MG capsule   HYDROcodone-acetaminophen (NORCO) 10-325 MG tablet   Chronic pain of both knees L>R       referral to Pain mgmt One time Rx for hydrocodone/APAP to assist with chronic    Relevant Medications   gabapentin (NEURONTIN) 300 MG capsule   HYDROcodone-acetaminophen (NORCO) 10-325 MG tablet   Tricompartment  osteoarthritis of both knees       Relevant Medications   HYDROcodone-acetaminophen (NORCO) 10-325 MG tablet   Morbid obesity with BMI of 45.0-49.9, adult ([place])       Follow up          Meds ordered this encounter  Medications  . albuterol (VENTOLIN HFA) 108 (90 Base) MCG/ACT inhaler    Sig: Inhale 1 puff daily, every 6 hours as needed for shortness of breath.    Dispense:  18 g    Refill:  12  . gabapentin (NEURONTIN) 300 MG capsule    Sig: Take 1 capsule (300 mg total) by mouth 3 (three) times daily.    Dispense:  90 capsule    Refill:  6  . HYDROcodone-acetaminophen (NORCO) 10-325 MG tablet    Sig: Take 1 tablet by mouth every 8 (eight) hours as needed for up to 10 days.    Dispense:  30 tablet    Refill:  0    Follow-up: No follow-ups on file.    [name], FNP

## 2020-02-24 ENCOUNTER — Other Ambulatory Visit: Payer: Self-pay | Admitting: Family Medicine

## 2020-02-24 DIAGNOSIS — Z1231 Encounter for screening mammogram for malignant neoplasm of breast: Secondary | ICD-10-CM

## 2020-02-26 MED FILL — FUROSEMIDE 20 MG TABS: 20 | 30 days supply | Qty: 30 | Fill #1

## 2020-02-26 MED FILL — GABAPENTIN 300 MG CAPSULE: 300 | 30 days supply | Qty: 90 | Fill #1

## 2020-02-26 MED FILL — hydrOXYzine HCL 25 MG TABS: 25 | 7 days supply | Qty: 30 | Fill #2

## 2020-03-04 ENCOUNTER — Other Ambulatory Visit: Payer: Self-pay

## 2020-03-04 ENCOUNTER — Encounter
Payer: Medicaid Other | Attending: Physical Medicine and Rehabilitation | Admitting: Physical Medicine and Rehabilitation

## 2020-03-04 ENCOUNTER — Encounter: Payer: Self-pay | Admitting: Physical Medicine and Rehabilitation

## 2020-03-04 VITALS — BP 139/87 | HR 87 | Temp 98.6°F | Ht 61.0 in | Wt 255.4 lb

## 2020-03-04 DIAGNOSIS — R252 Cramp and spasm: Secondary | ICD-10-CM | POA: Insufficient documentation

## 2020-03-04 DIAGNOSIS — M25562 Pain in left knee: Secondary | ICD-10-CM | POA: Diagnosis present

## 2020-03-04 DIAGNOSIS — M25561 Pain in right knee: Secondary | ICD-10-CM | POA: Insufficient documentation

## 2020-03-04 DIAGNOSIS — M1712 Unilateral primary osteoarthritis, left knee: Secondary | ICD-10-CM | POA: Insufficient documentation

## 2020-03-04 DIAGNOSIS — Z6841 Body Mass Index (BMI) 40.0 and over, adult: Secondary | ICD-10-CM | POA: Insufficient documentation

## 2020-03-04 DIAGNOSIS — M1711 Unilateral primary osteoarthritis, right knee: Secondary | ICD-10-CM | POA: Insufficient documentation

## 2020-03-04 NOTE — Progress Notes (Signed)
Knee injection  Indication:Bilateral Knee pain not relieved by medication management and other conservative care.  Informed consent was obtained after describing risks and benefits of the procedure with the patient, this includes bleeding, bruising, infection and medication side effects. The patient wishes to proceed and has given written consent. The patient was placed in a recumbent position. The medial aspects of bilateral knees was marked and prepped with Betadine and alcohol. It was then entered with a 25-gauge 1-1/2 inch needle and Monovisc was inserted into the bilateral knee joints after negative draw back for blood. The patient tolerated the procedure well. Post procedure instructions were given.

## 2020-03-21 ENCOUNTER — Ambulatory Visit
Admission: RE | Admit: 2020-03-21 | Discharge: 2020-03-21 | Disposition: A | Discharge status: Home / Self Care | Payer: Medicaid Other | Source: Ambulatory Visit | Attending: Family Medicine | Admitting: Family Medicine

## 2020-03-21 ENCOUNTER — Other Ambulatory Visit: Payer: Self-pay

## 2020-03-21 DIAGNOSIS — Z1231 Encounter for screening mammogram for malignant neoplasm of breast: Secondary | ICD-10-CM

## 2020-03-21 IMAGING — MG DIGITAL SCREENING BILAT W/ TOMO W/ CAD
6 of 12 series · 6 of 36 positions shown · non-contrast
Comparison: Previous exam(s).

CLINICAL DATA: Screening.

EXAM:
DIGITAL SCREENING BILATERAL MAMMOGRAM WITH TOMO AND CAD

[L CC synth-2D]
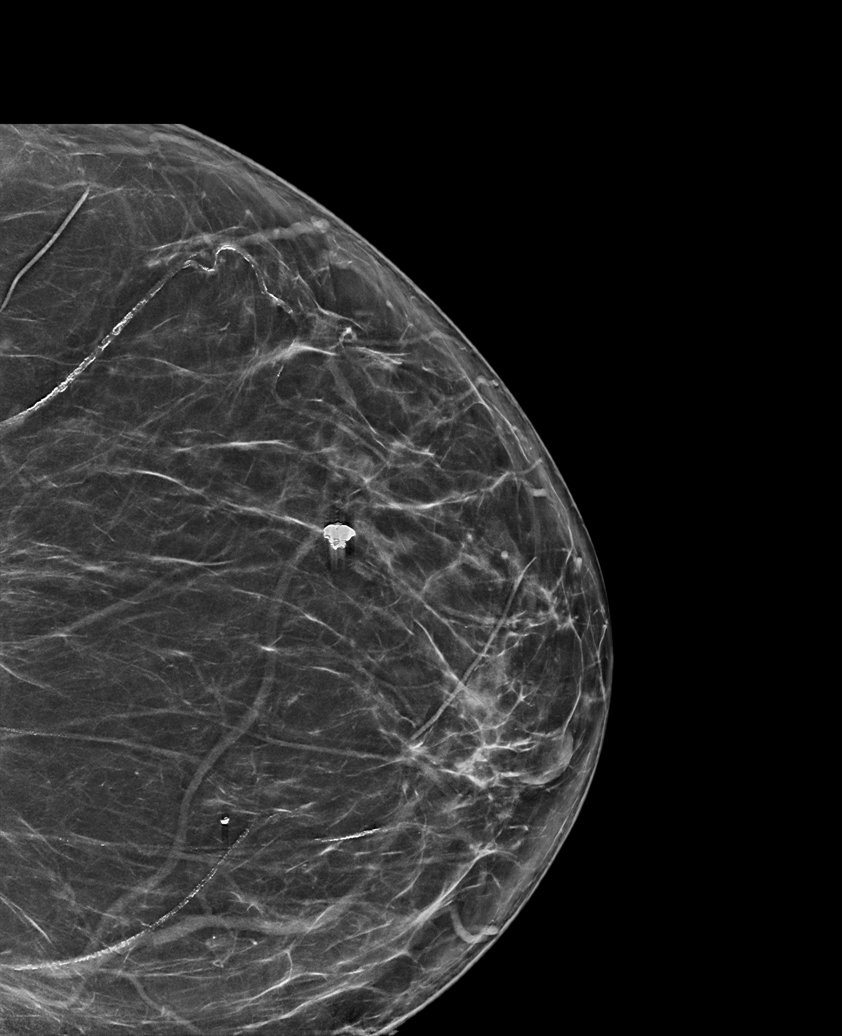

[R MLO synth-2D (1 of 2)]
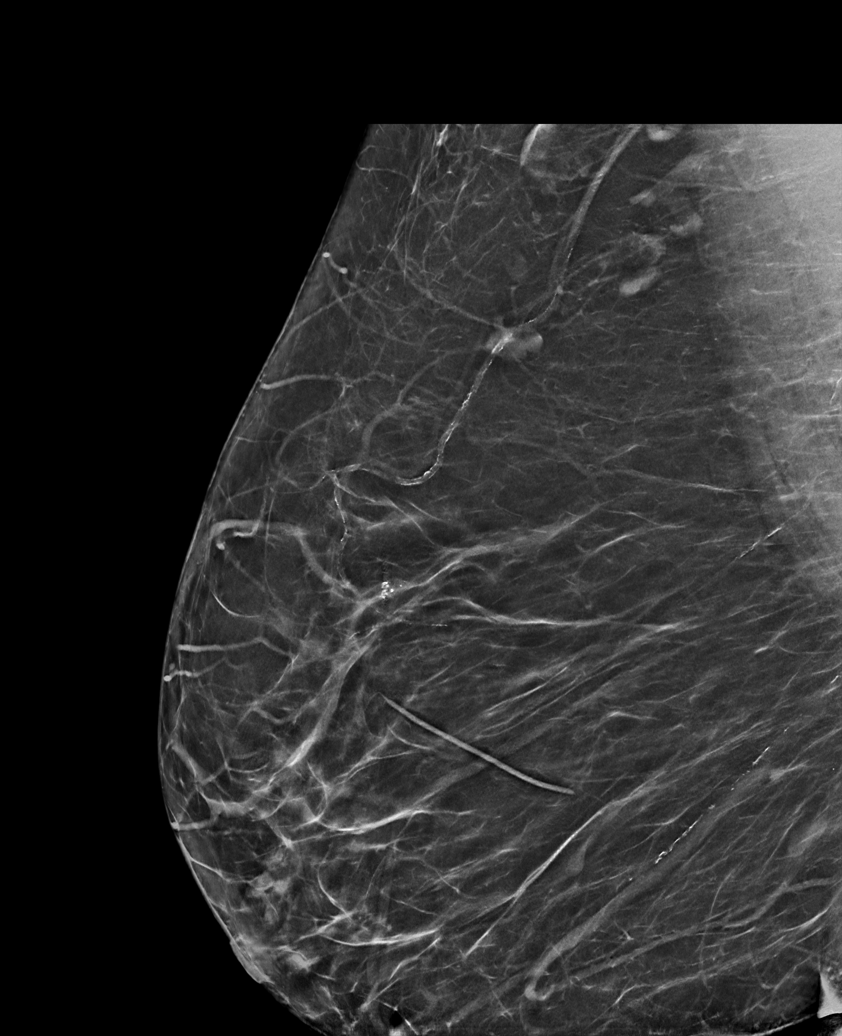

[R MLO synth-2D (2 of 2)]
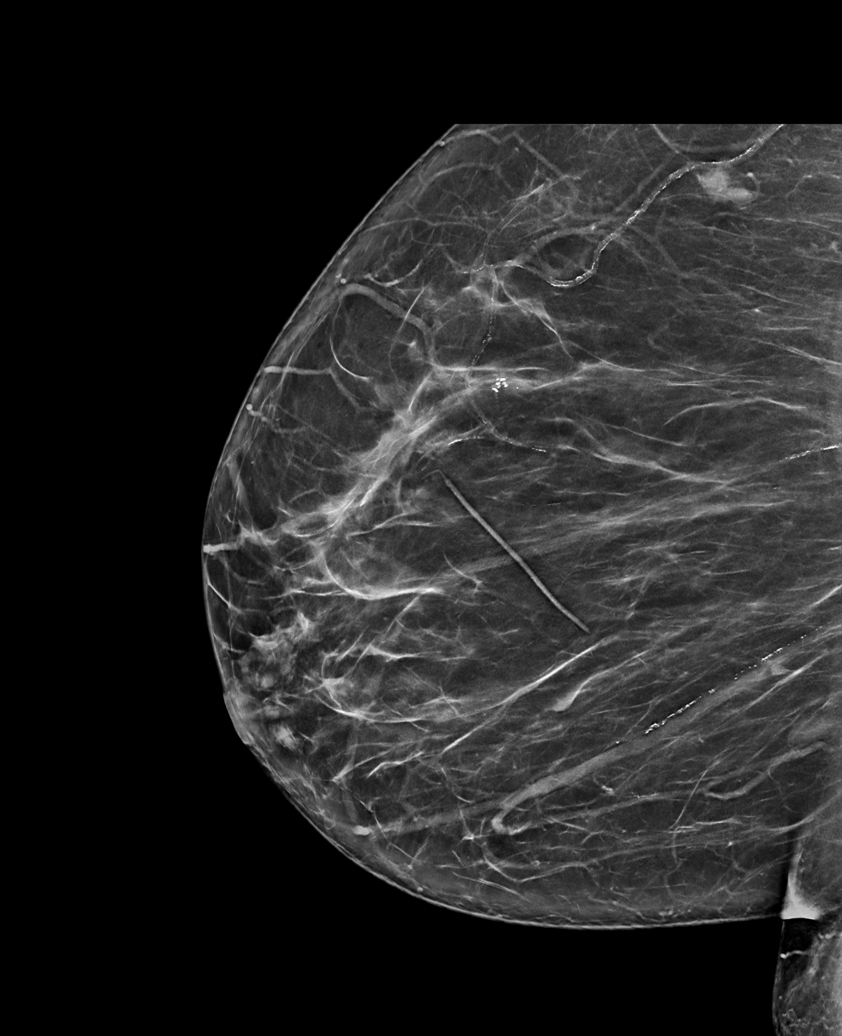

[R CV synth-2D]
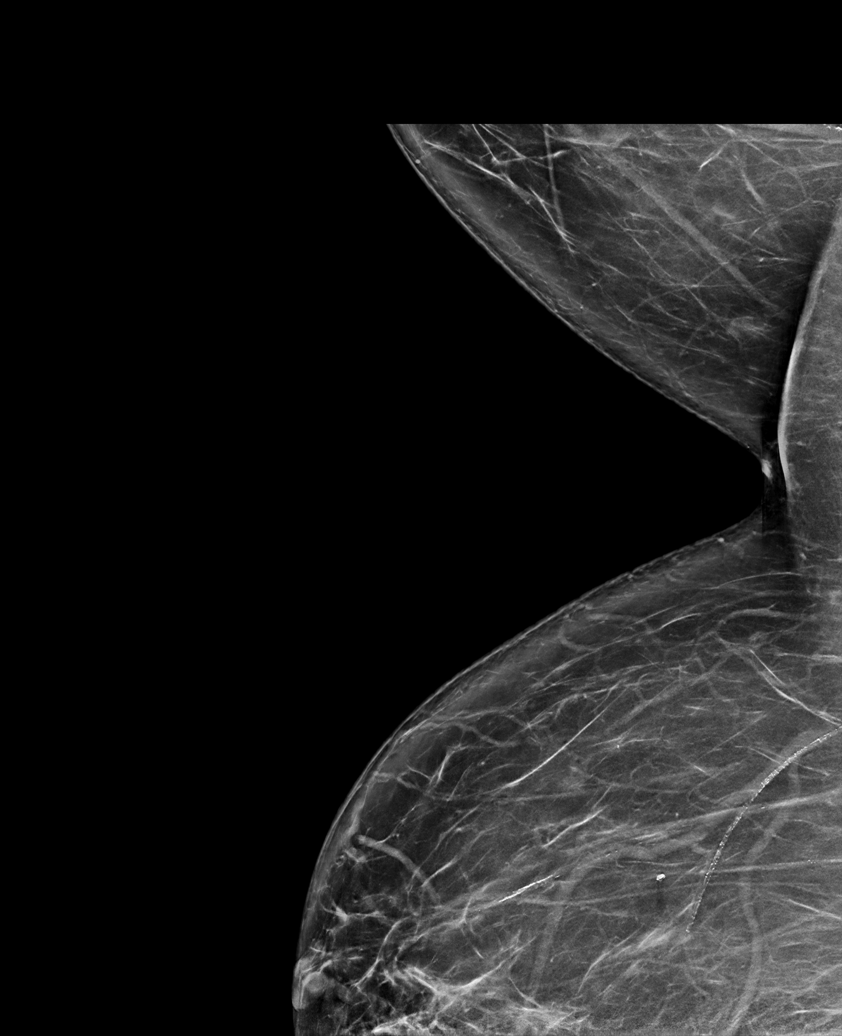

[R CC synth-2D]
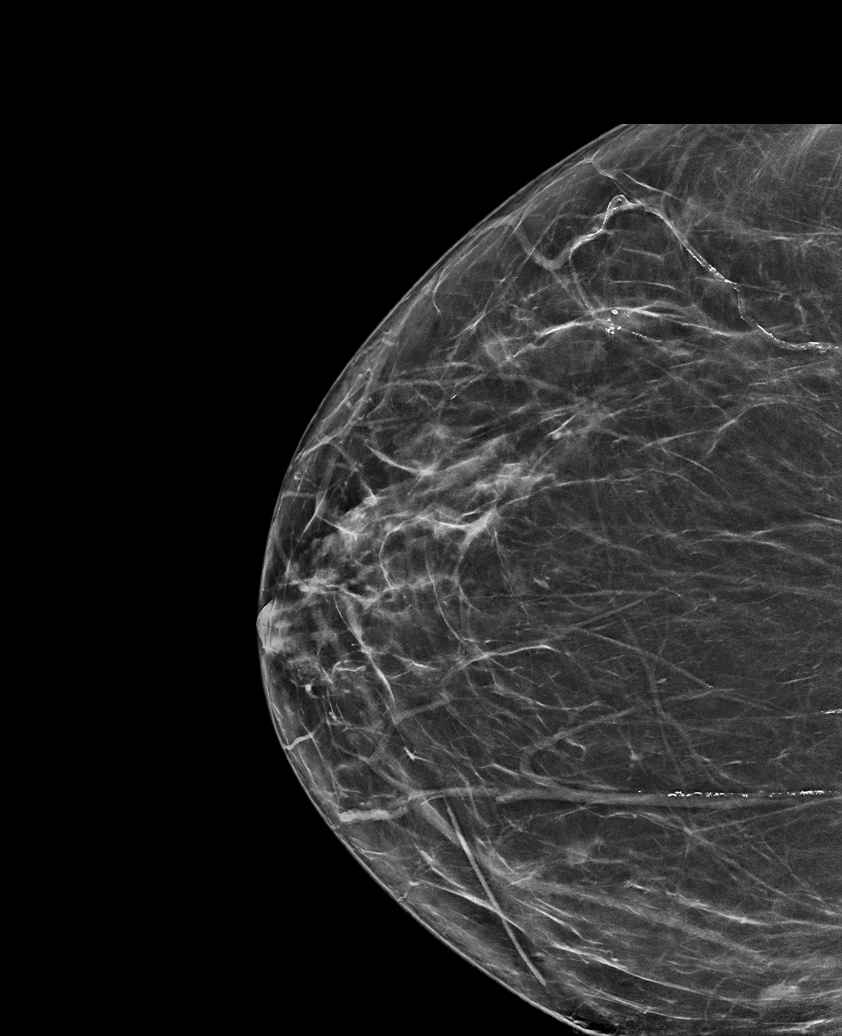

[L MLO synth-2D]
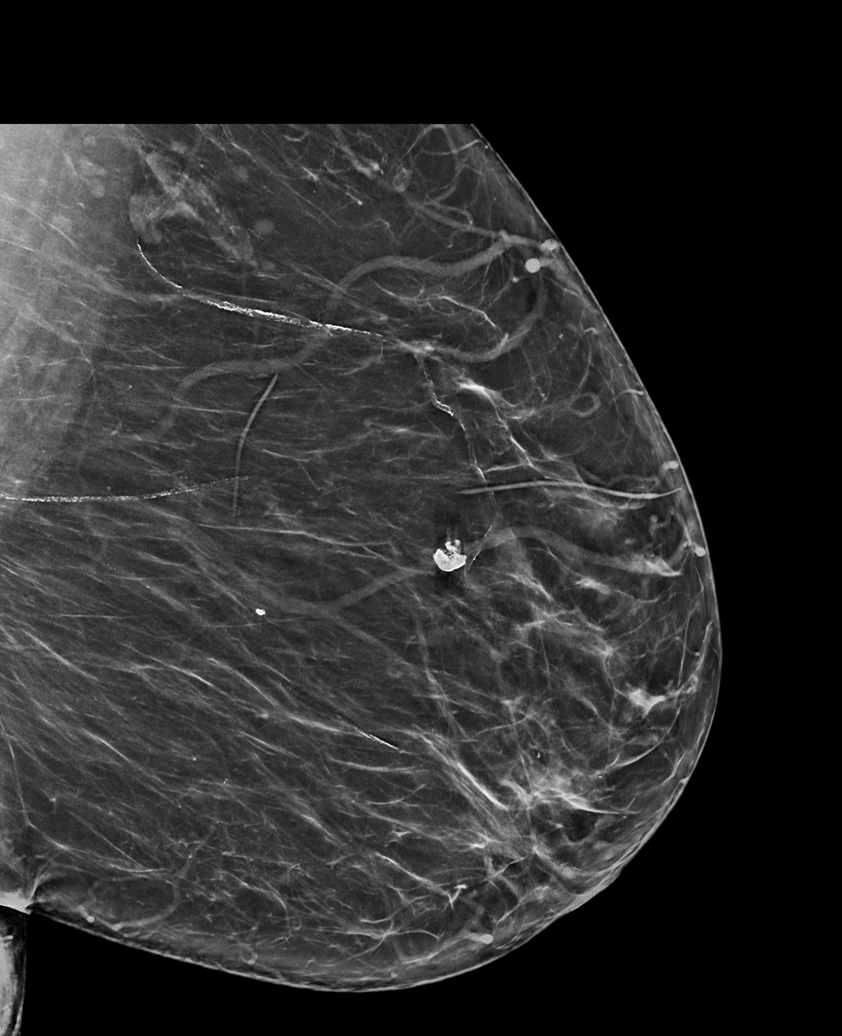

[6 of 36 positions shown; findings below may reference images not displayed]

ACR Breast Density Category b: There are scattered areas of
fibroglandular density.
FINDINGS: There are no findings suspicious for malignancy. Images were
processed with CAD.
IMPRESSION: No mammographic evidence of malignancy. A result letter of this
screening mammogram will be mailed directly to the patient.

RECOMMENDATION:
Screening mammogram in one year. (Code:[TQ])

BI-RADS CATEGORY  1: Negative.

## 2020-03-28 ENCOUNTER — Other Ambulatory Visit: Payer: Self-pay | Admitting: Family Medicine

## 2020-03-28 ENCOUNTER — Telehealth: Payer: Self-pay | Admitting: Nurse Practitioner

## 2020-03-28 DIAGNOSIS — G8929 Other chronic pain: Secondary | ICD-10-CM

## 2020-03-28 DIAGNOSIS — M170 Bilateral primary osteoarthritis of knee: Secondary | ICD-10-CM

## 2020-03-28 MED ORDER — HYDROCODONE-ACETAMINOPHEN 10-325 MG PO TABS: 1.0000 | ORAL_TABLET | Freq: Three times a day (TID) | ORAL | 0 refills | Status: DC | PRN

## 2020-03-28 NOTE — Telephone Encounter (Signed)
Done

## 2020-04-06 ENCOUNTER — Other Ambulatory Visit: Payer: Self-pay | Admitting: Family Medicine

## 2020-04-06 DIAGNOSIS — L299 Pruritus, unspecified: Secondary | ICD-10-CM

## 2020-04-06 DIAGNOSIS — R52 Pain, unspecified: Secondary | ICD-10-CM

## 2020-04-06 DIAGNOSIS — M25561 Pain in right knee: Secondary | ICD-10-CM

## 2020-04-06 DIAGNOSIS — I10 Essential (primary) hypertension: Secondary | ICD-10-CM

## 2020-04-06 DIAGNOSIS — L309 Dermatitis, unspecified: Secondary | ICD-10-CM

## 2020-04-06 DIAGNOSIS — T7840XS Allergy, unspecified, sequela: Secondary | ICD-10-CM

## 2020-04-06 DIAGNOSIS — G8929 Other chronic pain: Secondary | ICD-10-CM

## 2020-04-06 MED FILL — DICLOFENAC SODIUM 1% GEL: 1 | 12 days supply | Qty: 100 | Fill #1

## 2020-04-07 ENCOUNTER — Other Ambulatory Visit: Payer: Self-pay

## 2020-04-07 ENCOUNTER — Encounter: Payer: Self-pay | Admitting: Physical Medicine and Rehabilitation

## 2020-04-07 ENCOUNTER — Encounter
Payer: Medicaid Other | Attending: Physical Medicine and Rehabilitation | Admitting: Physical Medicine and Rehabilitation

## 2020-04-07 VITALS — BP 141/83 | HR 78 | Temp 98.3°F | Ht 61.0 in | Wt 257.0 lb

## 2020-04-07 DIAGNOSIS — M5440 Lumbago with sciatica, unspecified side: Secondary | ICD-10-CM | POA: Insufficient documentation

## 2020-04-07 DIAGNOSIS — M542 Cervicalgia: Secondary | ICD-10-CM | POA: Diagnosis not present

## 2020-04-07 DIAGNOSIS — R252 Cramp and spasm: Secondary | ICD-10-CM | POA: Insufficient documentation

## 2020-04-07 DIAGNOSIS — M1712 Unilateral primary osteoarthritis, left knee: Secondary | ICD-10-CM | POA: Insufficient documentation

## 2020-04-07 DIAGNOSIS — M25562 Pain in left knee: Secondary | ICD-10-CM | POA: Insufficient documentation

## 2020-04-07 DIAGNOSIS — Z6841 Body Mass Index (BMI) 40.0 and over, adult: Secondary | ICD-10-CM | POA: Insufficient documentation

## 2020-04-07 DIAGNOSIS — G8929 Other chronic pain: Secondary | ICD-10-CM | POA: Diagnosis not present

## 2020-04-07 DIAGNOSIS — R7303 Prediabetes: Secondary | ICD-10-CM | POA: Insufficient documentation

## 2020-04-07 DIAGNOSIS — Z124 Encounter for screening for malignant neoplasm of cervix: Secondary | ICD-10-CM | POA: Diagnosis not present

## 2020-04-07 DIAGNOSIS — M1711 Unilateral primary osteoarthritis, right knee: Secondary | ICD-10-CM | POA: Diagnosis not present

## 2020-04-07 DIAGNOSIS — M25561 Pain in right knee: Secondary | ICD-10-CM | POA: Diagnosis present

## 2020-04-07 NOTE — Progress Notes (Signed)
Subjective:    Patient ID: Caroline Ryan, female    DOB: [birth year], 54 y.o.   MRN: [id number]  HPI  Mrs. Cortopassi is a 54 year old woman who presents with bilateral knee OA. Last visit we did bilateral knee viscosupplementation injections that has helped her so much.   She wants to try a juice diet and help get her weight off. She has been eating fruits and vegetables.  She has been noting sharp pains shooting down her legs and arms. She felt weak when she had this pain. Her arm buckled when she had this pain.  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   She got a 30 day supply of Norco and this does help her.   Pain Inventory Average Pain 8 Pain Right Now 8 My pain is constant and burning  In the last 24 hours, has pain interfered with the following? General activity 6 Relation with others 0 Enjoyment of life 8 What TIME of day is your pain at its worst? night Sleep (in general) Fair  Pain is worse with: walking, bending and some activites Pain improves with: rest, heat/ice, therapy/exercise, pacing activities, medication and injections Relief from Meds: 9  Family History  Problem Relation Age of Onset  . Hypertension Father   . Diabetes Brother   . Diabetes Brother   . Breast cancer Mother 44  . Other Neg Hx    Social History   Socioeconomic History  . Marital status: Single    Spouse name: Not on file  . Number of children: Not on file  . Years of education: Not on file  . Highest education level: Not on file  Occupational History  . Not on file  Tobacco Use  . Smoking status: Former Smoker    Packs/day: 0.25    Years: 2.00    Pack years: 0.50    Types: Cigarettes    Quit date: 08/20/2014    Years since quitting: 5.6  . Smokeless tobacco: Never Used  Vaping Use  . Vaping Use: Never used  Substance and Sexual Activity  . Alcohol use: Yes    Alcohol/week: 2.0 standard drinks    Types: 2 Glasses of wine per week  . Drug use: No  . Sexual  activity: Yes    Birth control/protection: Condom  Other Topics Concern  . Not on file  Social History Narrative  . Not on file   Social Determinants of Health   Financial Resource Strain:   . Difficulty of Paying Living Expenses: Not on file  Food Insecurity:   . Worried About Charity fundraiser in the Last Year: Not on file  . Ran Out of Food in the Last Year: Not on file  Transportation Needs:   . Lack of Transportation (Medical): Not on file  . Lack of Transportation (Non-Medical): Not on file  Physical Activity:   . Days of Exercise per Week: Not on file  . Minutes of Exercise per Session: Not on file  Stress:   . Feeling of Stress : Not on file  Social Connections:   . Frequency of Communication with Friends and Family: Not on file  . Frequency of Social Gatherings with Friends and Family: Not on file  . Attends Religious Services: Not on file  . Active Member of Clubs or Organizations: Not on file  . Attends Archivist Meetings: Not on file  . Marital Status: Not on file   Past Surgical History:  Procedure Laterality Date  . ABDOMINAL HYSTERECTOMY    . BILATERAL SALPINGECTOMY Bilateral 10/10/2017   Procedure: BILATERAL SALPINGECTOMY;  Surgeon: Chancy Milroy, MD;  Location: [facility];  Service: Gynecology;  Laterality: Bilateral;  . BREAST BIOPSY     US guided core 2009  . BREAST EXCISIONAL BIOPSY     right 1982 left 1981  . BREAST SURGERY     bilateral cysts/benign  . VAGINAL HYSTERECTOMY N/A 10/10/2017   Procedure: HYSTERECTOMY VAGINAL;  Surgeon: Chancy Milroy, MD;  Location: [facility];  Service: Gynecology;  Laterality: N/A;   Past Surgical History:  Procedure Laterality Date  . ABDOMINAL HYSTERECTOMY    . BILATERAL SALPINGECTOMY Bilateral 10/10/2017   Procedure: BILATERAL SALPINGECTOMY;  Surgeon: Chancy Milroy, MD;  Location: [facility];  Service: Gynecology;  Laterality: Bilateral;  . BREAST BIOPSY     US guided core 2009  . BREAST EXCISIONAL  BIOPSY     right 1982 left 1981  . BREAST SURGERY     bilateral cysts/benign  . VAGINAL HYSTERECTOMY N/A 10/10/2017   Procedure: HYSTERECTOMY VAGINAL;  Surgeon: Chancy Milroy, MD;  Location: [facility];  Service: Gynecology;  Laterality: N/A;   Past Medical History:  Diagnosis Date  . Anemia   . Asthma   . Depression    no meds  . Eczema   . Fibroid   . Headache(784.0)    otc prn med  . History of blood transfusion   . Hypertension   . Knee pain, bilateral    arthralgia knee pain bilateral  . Seasonal allergies   . SVD (spontaneous vaginal delivery)    x 3   BP (!) 141/83   Pulse 78   Temp 98.3 F (36.8 C)   Ht 5\' 1"  (1.549 m)   Wt 257 lb (116.6 kg)   LMP 04/03/2017 (Approximate)   SpO2 97%   BMI 48.56 kg/m   Opioid Risk Score:   Fall Risk Score:  `1  Depression screen PHQ 2/9  Depression screen [facility] 2/9 04/07/2020 03/04/2020 02/16/2020 01/22/2020 09/29/2019 12/24/2018 10/31/2018  Decreased Interest 1 1 1  0 1 0 0  Down, Depressed, Hopeless 1 1 1 1 1  0 0  PHQ - 2 Score 2 2 2 1 2  0 0  Altered sleeping - - 0 - 1 - -  Tired, decreased energy - - 1 - 3 - -  Change in appetite - - 0 - 1 - -  Feeling bad or failure about yourself  - - 0 - 1 - -  Trouble concentrating - - 0 - 0 - -  Moving slowly or fidgety/restless - - 0 - 3 - -  Suicidal thoughts - - 0 - 0 - -  PHQ-9 Score - - 3 - 11 - -  Difficult doing work/chores - - - - - - -  Some recent data might be hidden    Review of Systems  Constitutional: Negative.   HENT: Negative.   Eyes: Negative.   Respiratory: Negative.   Cardiovascular: Negative.   Endocrine: Negative.   Genitourinary: Negative.   Musculoskeletal: Positive for arthralgias and gait problem.  Skin: Negative.   Allergic/Immunologic: Negative.   Hematological: Negative.   Psychiatric/Behavioral: Negative.   All other systems reviewed and are negative.      Objective:   Physical Exam Gen: no distress, normal appearing HEENT: oral mucosa pink and  moist, NCAT Cardio: Reg rate Chest: normal effort, normal rate of breathing Abd: soft, non-distended Ext: no  edema Skin: intact Neuro: Alert and oriented x3. Musculoskeletal: 5/5 strength throughout upper and lower extremities. Sensation intact.  Psych: pleasant, normal affect    Assessment & Plan:  Mrs. Abrams is a 54 year old woman presenting to establish care for bilateral chronic knee pain, neck pain.  1) Bilateral knee OA: -XRs reviewed and consistent with OA -She received steroid injections q39months. -She had viscosupplementation injections last visit with 3 weeks of very good relief.  -Vitamin D level normal.  -Prescribed compounding cream as follows: Diclofenac 3%, Gabapentin 5%, Lidocaine 5%, Menthol 1% compounded at [facility]:  apply 1-2 pumps three to four times per day as needed  2) Muscle cramps:magneisum level was normal last visit.  3) Morbid obesity: discussed intermittent fasting and low carb diet. She eats very healthy! Made goal to start eating at 11:15am instead of 11am and to move dinner time from 8:30pm to 8:15pm. Has lost three lbs in the past month! Advised that this is 18 lbs of the knees! -Check HbA1c today.   4) General health -reviewed to gynecology for pap smear -reviewed lipids and were normal last year.   5) Radicular symptoms in upper and lower extremities: -no evidence of weakness on exam to suggest myelopathy -obtain cervical and lumbar spine XRs. I will call next day for results. -Patient worried about heart attack- advised that symptoms appear to be more radicular and are not accompanied by chest pain.   All questions answered. RTC in 1 month to assess progress with above plan. Forty minutes of time spent in physical examination of patient; calling in script to [facility]; discussion of obesity and intermittent fasting and impact this will have on knee OA; discussion of benefits of viscosupplementation; referring to  gynecology for pap smear and ordering HbA1c to assess for diabetes.

## 2020-04-08 ENCOUNTER — Telehealth (INDEPENDENT_AMBULATORY_CARE_PROVIDER_SITE_OTHER): Payer: Medicaid Other | Admitting: Nurse Practitioner

## 2020-04-08 LAB — HEMOGLOBIN A1C
Est. average glucose Bld gHb Est-mCnc: 103 mg/dL
Hgb A1c MFr Bld: 5.2 % (ref 4.8–5.6)

## 2020-04-09 NOTE — Progress Notes (Signed)
° °  [address] [place], [place]  [address] Phone:  [phone]   Fax:  [phone]  Patient needed a PAP smear scheduled.

## 2020-04-12 ENCOUNTER — Encounter: Payer: Self-pay | Admitting: Orthopaedic Surgery

## 2020-04-12 ENCOUNTER — Ambulatory Visit (INDEPENDENT_AMBULATORY_CARE_PROVIDER_SITE_OTHER): Payer: Medicaid Other | Admitting: Orthopaedic Surgery

## 2020-04-12 VITALS — Ht 60.24 in | Wt 255.0 lb

## 2020-04-12 DIAGNOSIS — M25562 Pain in left knee: Secondary | ICD-10-CM | POA: Diagnosis not present

## 2020-04-12 DIAGNOSIS — M25561 Pain in right knee: Secondary | ICD-10-CM | POA: Diagnosis not present

## 2020-04-12 DIAGNOSIS — G8929 Other chronic pain: Secondary | ICD-10-CM | POA: Diagnosis not present

## 2020-04-12 DIAGNOSIS — M1711 Unilateral primary osteoarthritis, right knee: Secondary | ICD-10-CM

## 2020-04-12 DIAGNOSIS — M1712 Unilateral primary osteoarthritis, left knee: Secondary | ICD-10-CM

## 2020-04-12 MED ORDER — METHYLPREDNISOLONE ACETATE 40 MG/ML IJ SUSP
40.0000 mg | INTRAMUSCULAR | Status: AC | PRN
  Administered 2020-04-12: 40 mg via INTRA_ARTICULAR

## 2020-04-12 MED ORDER — LIDOCAINE HCL 1 % IJ SOLN
3.0000 mL | INTRAMUSCULAR | Status: AC | PRN
  Administered 2020-04-12: 3 mL

## 2020-04-12 NOTE — Progress Notes (Signed)
Office Visit Note   Patient: Caroline Ryan           Date of Birth: 01-10-1966           MRN: [id number] Visit Date: 04/12/2020              Requested by: [name], [place],  [place] [id number] PCP: Vevelyn Francois, NP   Assessment & Plan: Visit Diagnoses:  1. Primary osteoarthritis of right knee   2. Primary osteoarthritis of left knee   3. Chronic pain of right knee   4. Chronic pain of left knee   5. Severe obesity (BMI >= 40) (HCC)     Plan: I did provide a steroid injection in both knees today. I discussed in detail again the risk and benefits of injections. We will see her back in 3 months for repeat weight and BMI calculation and determine whether or not she would benefit from injections again.The patient meets the AMA guidelines for Morbid (severe) obesity with a BMI > 40.0 and I have recommended weight loss.  Follow-Up Instructions: Return in about 3 months (around 07/13/2020).   Orders:  Orders Placed This Encounter  Procedures  . Large Joint Inj  . Large Joint Inj   No orders of the defined types were placed in this encounter.     Procedures: Large Joint Inj: R knee on 04/12/2020 10:10 AM Indications: diagnostic evaluation and pain Details: 22 G 1.5 in needle, superolateral approach  Arthrogram: No  Medications: 3 mL lidocaine 1 %; 40 mg methylPREDNISolone acetate 40 MG/ML Outcome: tolerated well, no immediate complications Procedure, treatment alternatives, risks and benefits explained, specific risks discussed. Consent was given by the patient. Immediately prior to procedure a time out was called to verify the correct patient, procedure, equipment, support staff and site/side marked as required. Patient was prepped and draped in the usual sterile fashion.   Large Joint Inj: L knee on 04/12/2020 10:10 AM Indications: diagnostic evaluation and pain Details: 22 G 1.5 in needle, superolateral approach  Arthrogram:  No  Medications: 3 mL lidocaine 1 %; 40 mg methylPREDNISolone acetate 40 MG/ML Outcome: tolerated well, no immediate complications Procedure, treatment alternatives, risks and benefits explained, specific risks discussed. Consent was given by the patient. Immediately prior to procedure a time out was called to verify the correct patient, procedure, equipment, support staff and site/side marked as required. Patient was prepped and draped in the usual sterile fashion.       Clinical Data: No additional findings.   Subjective: Chief Complaint  Patient presents with  . Right Knee - Injections  . Left Knee - Injections  The patient is here today with bilateral knee pain and known severe end-stage arthritis of both her knees. We last injected her knees with steroids over 3 months ago and it did help her quite a bit. She is try to work on weight loss. Her BMI today is 49.41. She is not diabetic. She said no other acute change in medical status. She would like to have injections in her knees again today. It has been far enough out from her last injections to safely do this.  HPI  Review of Systems She currently denies a headache, chest pain, shortness of breath, fever, chills, nausea, vomiting  Objective: Vital Signs: Ht 5' 0.24" (1.53 m)   Wt 255 lb (115.7 kg)   LMP 04/03/2017 (Approximate)   BMI 49.41 kg/m   Physical Exam She is alert  and orient x3 and in no acute distress Ortho Exam Examination of both knees shows a large soft tissue envelope in her thighs. Both knees have varus malalignment. Both knees have good flexion extension but significant patellofemoral crepitation and global joint line tenderness. Specialty Comments:  No specialty comments available.  Imaging: No results found.   PMFS History: Patient Active Problem List   Diagnosis Date Noted  . Unilateral primary osteoarthritis, left knee 10/22/2019  . Unilateral primary osteoarthritis, right knee 10/22/2019  .  BMI 50.0-59.9, adult ([place]) 10/22/2019  . Grieving 06/10/2019  . Pruritus 06/10/2019  . Bilateral lower extremity edema 11/02/2018  . Post-operative state 10/10/2017  . Symptomatic anemia 05/20/2017  . Arthralgia of both knees 06/27/2015  . BACK STRAIN, LUMBAR 03/16/2010  . KNEE PAIN, BILATERAL 06/30/2009  . OBESITY 04/13/2009  . Edema 04/13/2009  . [place], BREAST 04/20/2008  . HYPERTENSION, BENIGN ESSENTIAL 04/14/2008  . Allergic rhinitis 04/14/2008  . Asthma 04/14/2008   Past Medical History:  Diagnosis Date  . Anemia   . Asthma   . Depression    no meds  . Eczema   . Fibroid   . Headache(784.0)    otc prn med  . History of blood transfusion   . Hypertension   . Knee pain, bilateral    arthralgia knee pain bilateral  . Seasonal allergies   . SVD (spontaneous vaginal delivery)    x 3    Family History  Problem Relation Age of Onset  . Hypertension Father   . Diabetes Brother   . Diabetes Brother   . Breast cancer Mother 52  . Other Neg Hx     Past Surgical History:  Procedure Laterality Date  . ABDOMINAL HYSTERECTOMY    . BILATERAL SALPINGECTOMY Bilateral 10/10/2017   Procedure: BILATERAL SALPINGECTOMY;  Surgeon: Chancy Milroy, MD;  Location: [facility];  Service: Gynecology;  Laterality: Bilateral;  . BREAST BIOPSY     US guided core 2009  . BREAST EXCISIONAL BIOPSY     right 1982 left 1981  . BREAST SURGERY     bilateral cysts/benign  . VAGINAL HYSTERECTOMY N/A 10/10/2017   Procedure: HYSTERECTOMY VAGINAL;  Surgeon: Chancy Milroy, MD;  Location: [facility];  Service: Gynecology;  Laterality: N/A;   Social History   Occupational History  . Not on file  Tobacco Use  . Smoking status: Former Smoker    Packs/day: 0.25    Years: 2.00    Pack years: 0.50    Types: Cigarettes    Quit date: 08/20/2014    Years since quitting: 5.6  . Smokeless tobacco: Never Used  Vaping Use  . Vaping Use: Never used  Substance and Sexual Activity  . Alcohol use: Yes     Alcohol/week: 2.0 standard drinks    Types: 2 Glasses of wine per week  . Drug use: No  . Sexual activity: Yes    Birth control/protection: Condom

## 2020-04-21 ENCOUNTER — Other Ambulatory Visit: Payer: Self-pay | Admitting: Family Medicine

## 2020-04-21 DIAGNOSIS — L299 Pruritus, unspecified: Secondary | ICD-10-CM

## 2020-04-21 DIAGNOSIS — I10 Essential (primary) hypertension: Secondary | ICD-10-CM

## 2020-04-21 DIAGNOSIS — T7840XS Allergy, unspecified, sequela: Secondary | ICD-10-CM

## 2020-04-21 MED FILL — GABAPENTIN 300 MG CAPSULE: 300 | 30 days supply | Qty: 90 | Fill #2

## 2020-04-26 ENCOUNTER — Other Ambulatory Visit: Payer: Self-pay | Admitting: Family Medicine

## 2020-04-26 DIAGNOSIS — R52 Pain, unspecified: Secondary | ICD-10-CM

## 2020-04-26 DIAGNOSIS — I10 Essential (primary) hypertension: Secondary | ICD-10-CM

## 2020-04-26 DIAGNOSIS — L299 Pruritus, unspecified: Secondary | ICD-10-CM

## 2020-04-26 DIAGNOSIS — T7840XS Allergy, unspecified, sequela: Secondary | ICD-10-CM

## 2020-04-26 DIAGNOSIS — M25562 Pain in left knee: Secondary | ICD-10-CM

## 2020-04-26 MED FILL — FUROSEMIDE 20 MG TABS: 20 | 30 days supply | Qty: 30 | Fill #2

## 2020-04-28 ENCOUNTER — Telehealth: Payer: Self-pay | Admitting: Nurse Practitioner

## 2020-04-28 NOTE — Telephone Encounter (Signed)
error 

## 2020-04-28 NOTE — Telephone Encounter (Signed)
Requesting a refill for hydrocodone 325mg  tabs for her chronic knee pain.    Refill to be sent to [facility] on [address]

## 2020-04-28 NOTE — Telephone Encounter (Signed)
I will have to redirect to PCP for this

## 2020-04-29 ENCOUNTER — Ambulatory Visit
Admission: RE | Admit: 2020-04-29 | Discharge: 2020-04-29 | Disposition: A | Discharge status: Home / Self Care | Payer: Medicaid Other | Source: Ambulatory Visit | Attending: Physical Medicine and Rehabilitation | Admitting: Physical Medicine and Rehabilitation

## 2020-04-29 DIAGNOSIS — M47816 Spondylosis without myelopathy or radiculopathy, lumbar region: Secondary | ICD-10-CM | POA: Diagnosis not present

## 2020-04-29 DIAGNOSIS — M4722 Other spondylosis with radiculopathy, cervical region: Secondary | ICD-10-CM | POA: Diagnosis not present

## 2020-04-29 DIAGNOSIS — M4316 Spondylolisthesis, lumbar region: Secondary | ICD-10-CM | POA: Diagnosis not present

## 2020-04-29 DIAGNOSIS — M5440 Lumbago with sciatica, unspecified side: Secondary | ICD-10-CM

## 2020-04-29 DIAGNOSIS — M542 Cervicalgia: Secondary | ICD-10-CM

## 2020-04-29 IMAGING — DX DG CERVICAL SPINE 2 OR 3 VIEWS
3 series · 3 of 3 positions shown · non-contrast
Comparison: None.

CLINICAL DATA: Bilateral upper extremity radiculopathy

EXAM:
CERVICAL SPINE - 2-3 VIEW

[dg cervical spine 2 or 3 views (1 of 3)]
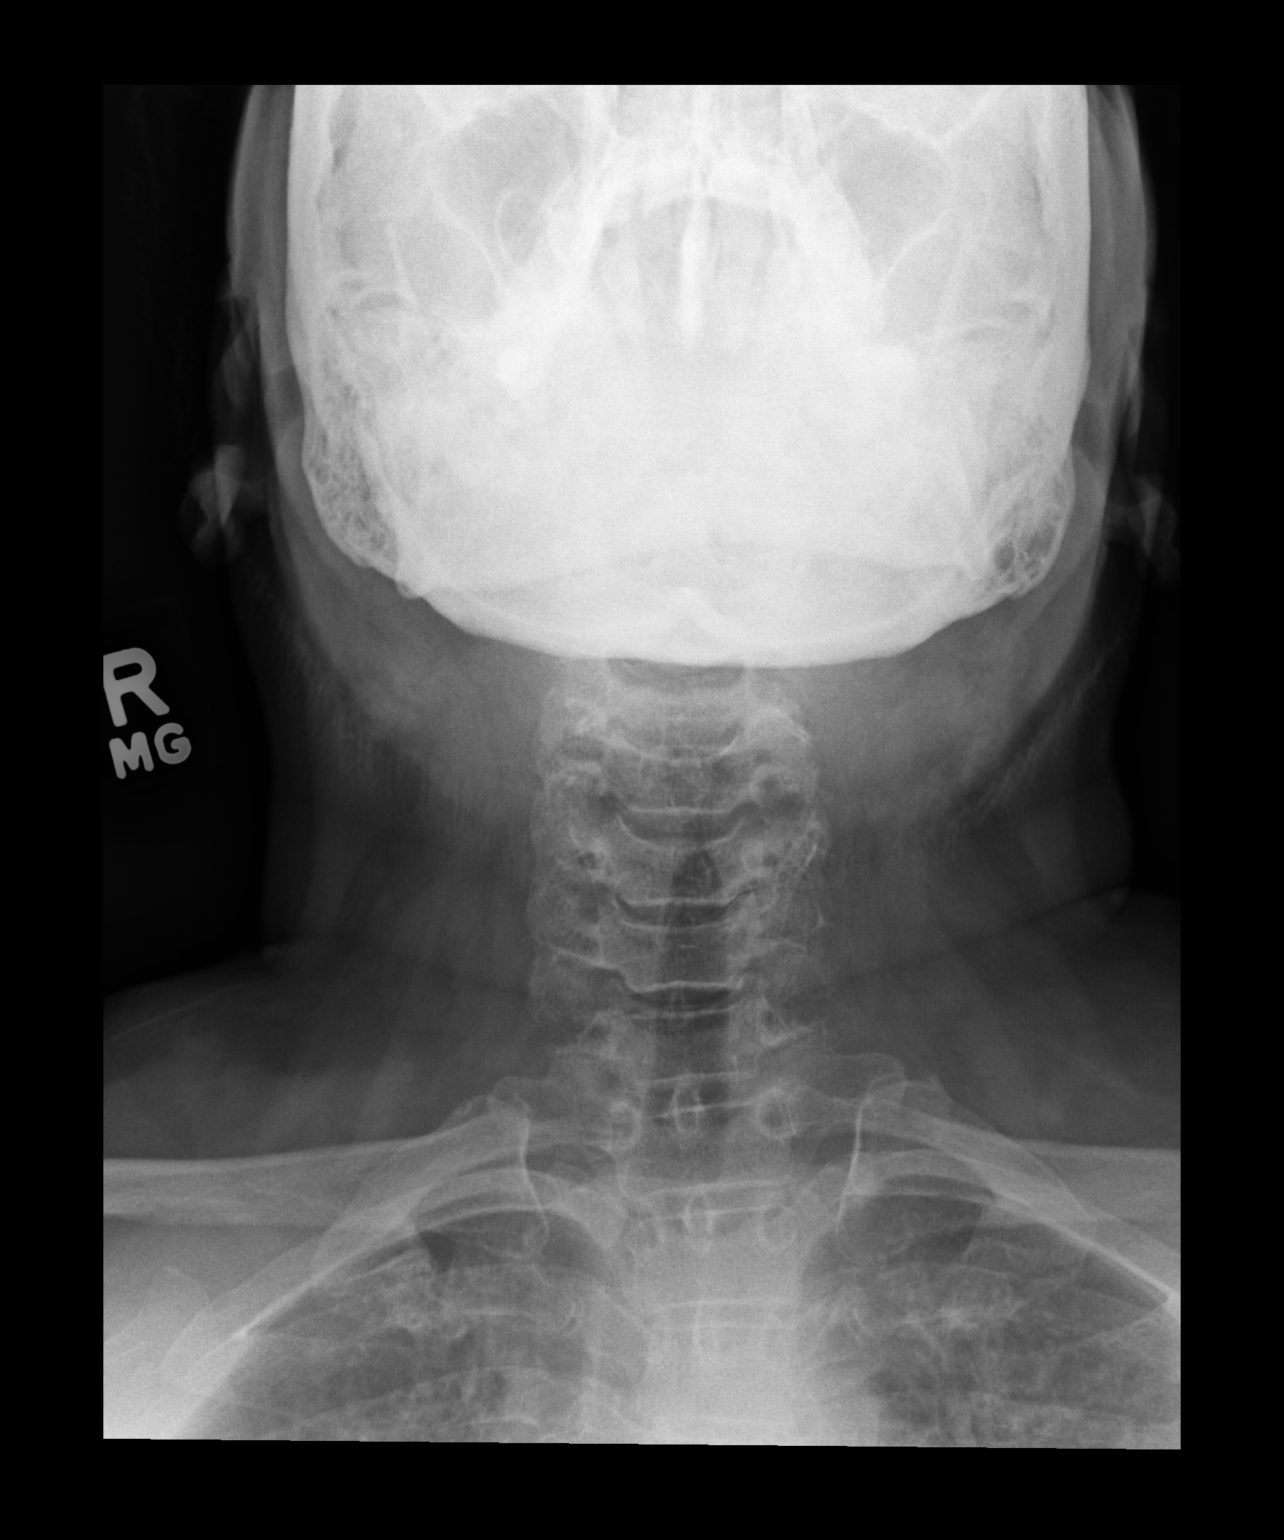

[dg cervical spine 2 or 3 views (2 of 3)]
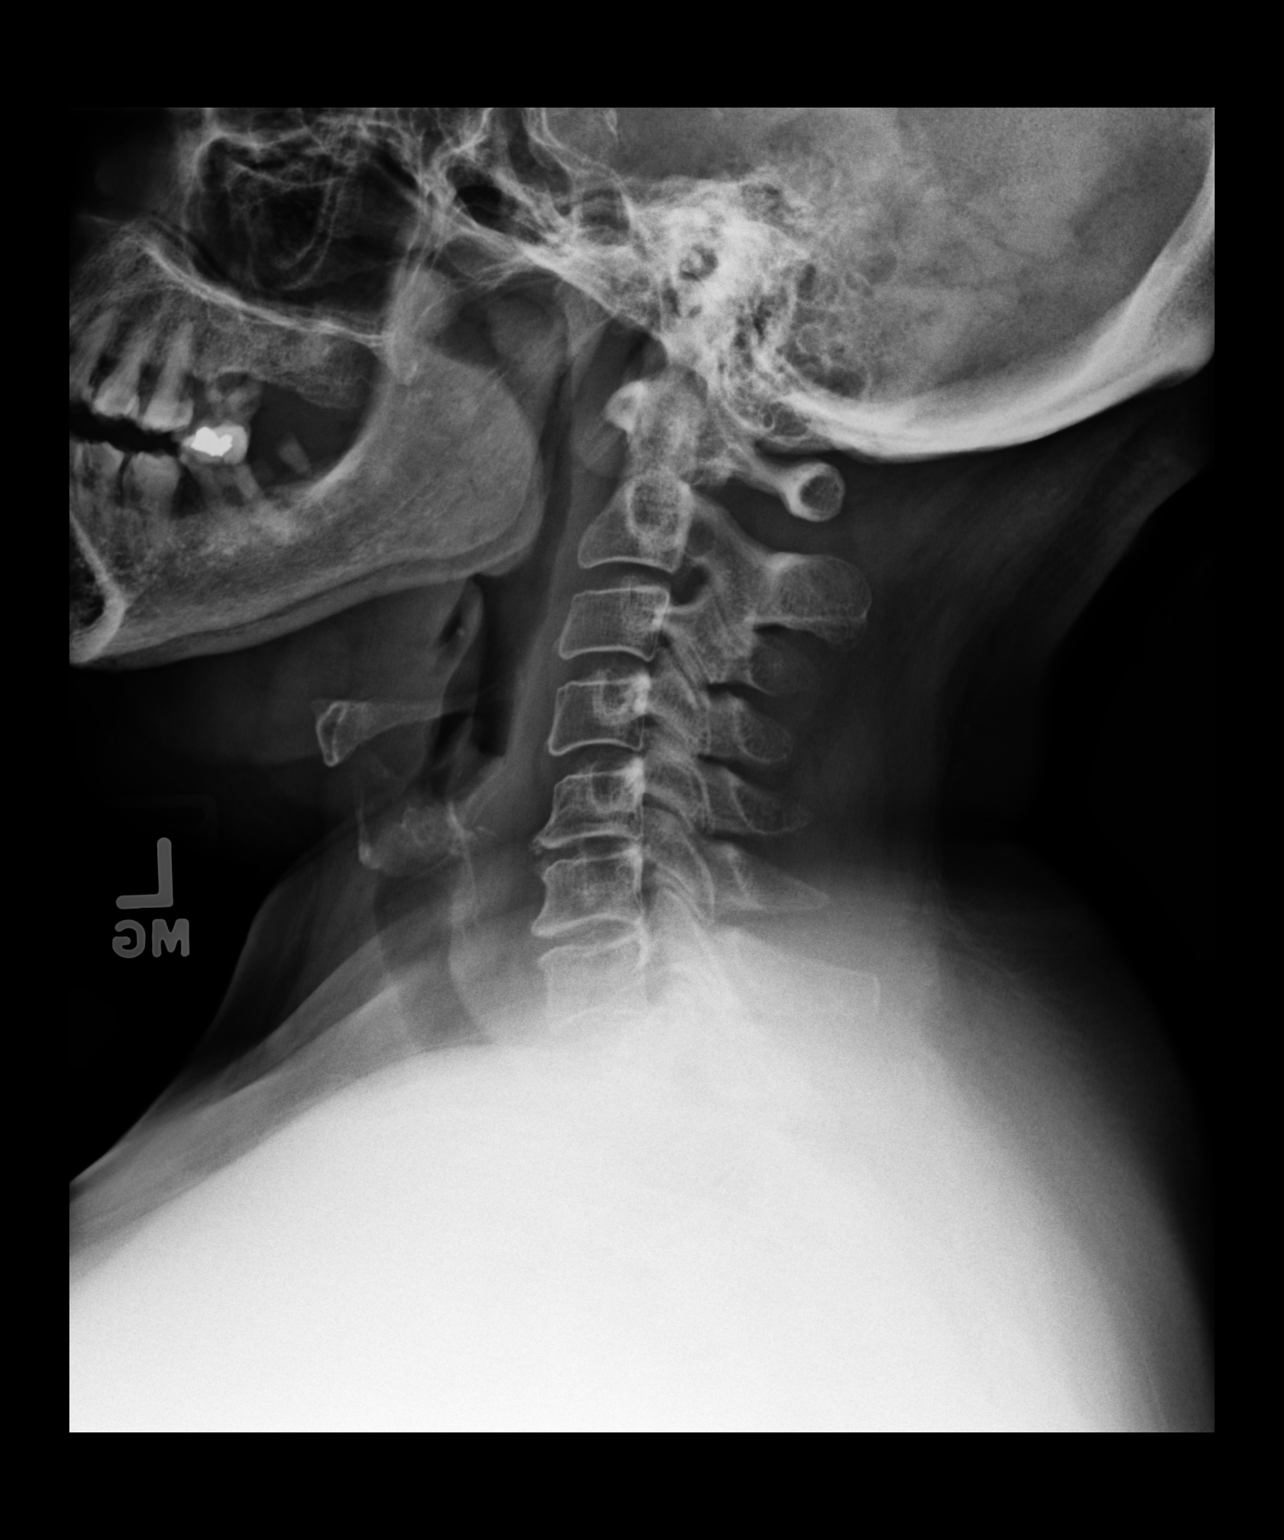

[dg cervical spine 2 or 3 views (3 of 3)]
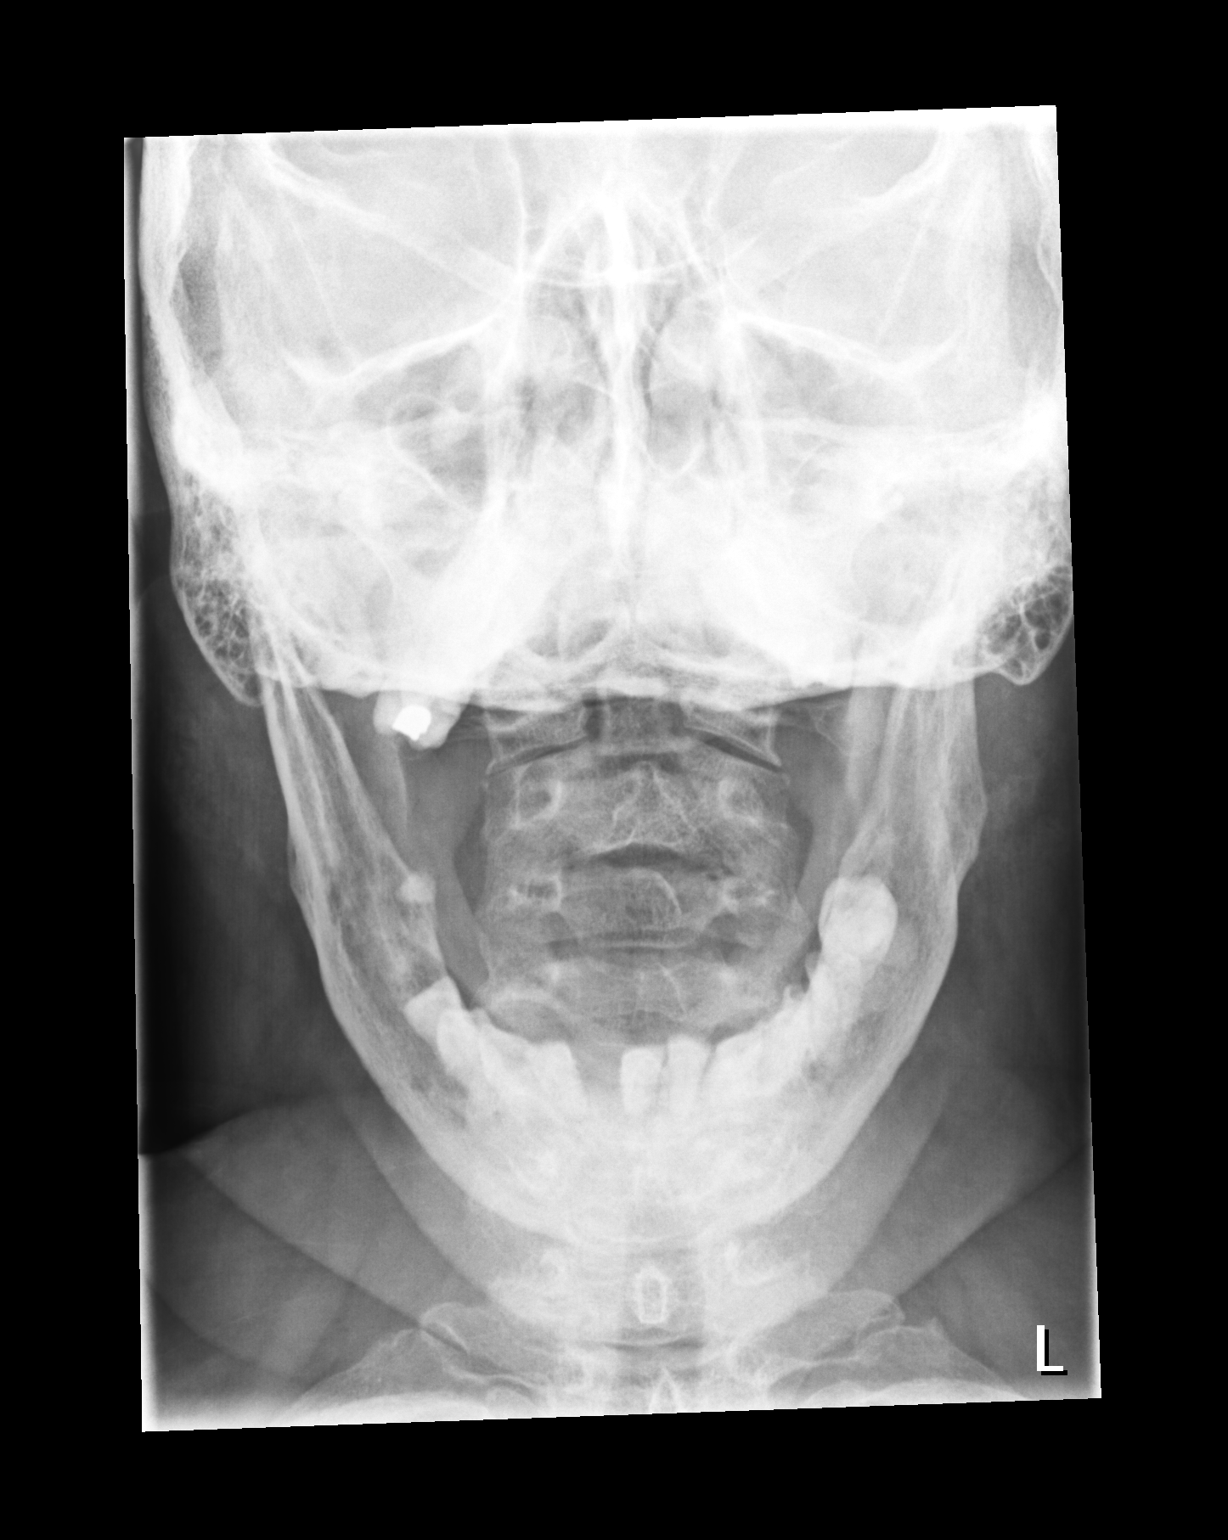

[3 of 3 positions shown; findings below may reference images not displayed]

FINDINGS: Frontal and lateral views of the cervical spine are obtained.
Alignment is anatomic to the cervicothoracic junction. No acute
fracture. Mild spondylosis at C5-6 and C6-7. Prevertebral soft
tissues are unremarkable. Lung apices are clear.
IMPRESSION: 1. Mild spondylosis at C5-6 and C6-7.  No acute fracture.

## 2020-04-29 IMAGING — DX DG LUMBAR SPINE 2-3V
3 series · 3 of 3 positions shown · non-contrast
Comparison: [DATE]

CLINICAL DATA: Pain

EXAM:
LUMBAR SPINE - 2-3 VIEW

[dg lumbar spine 2-3 views (1 of 3)]
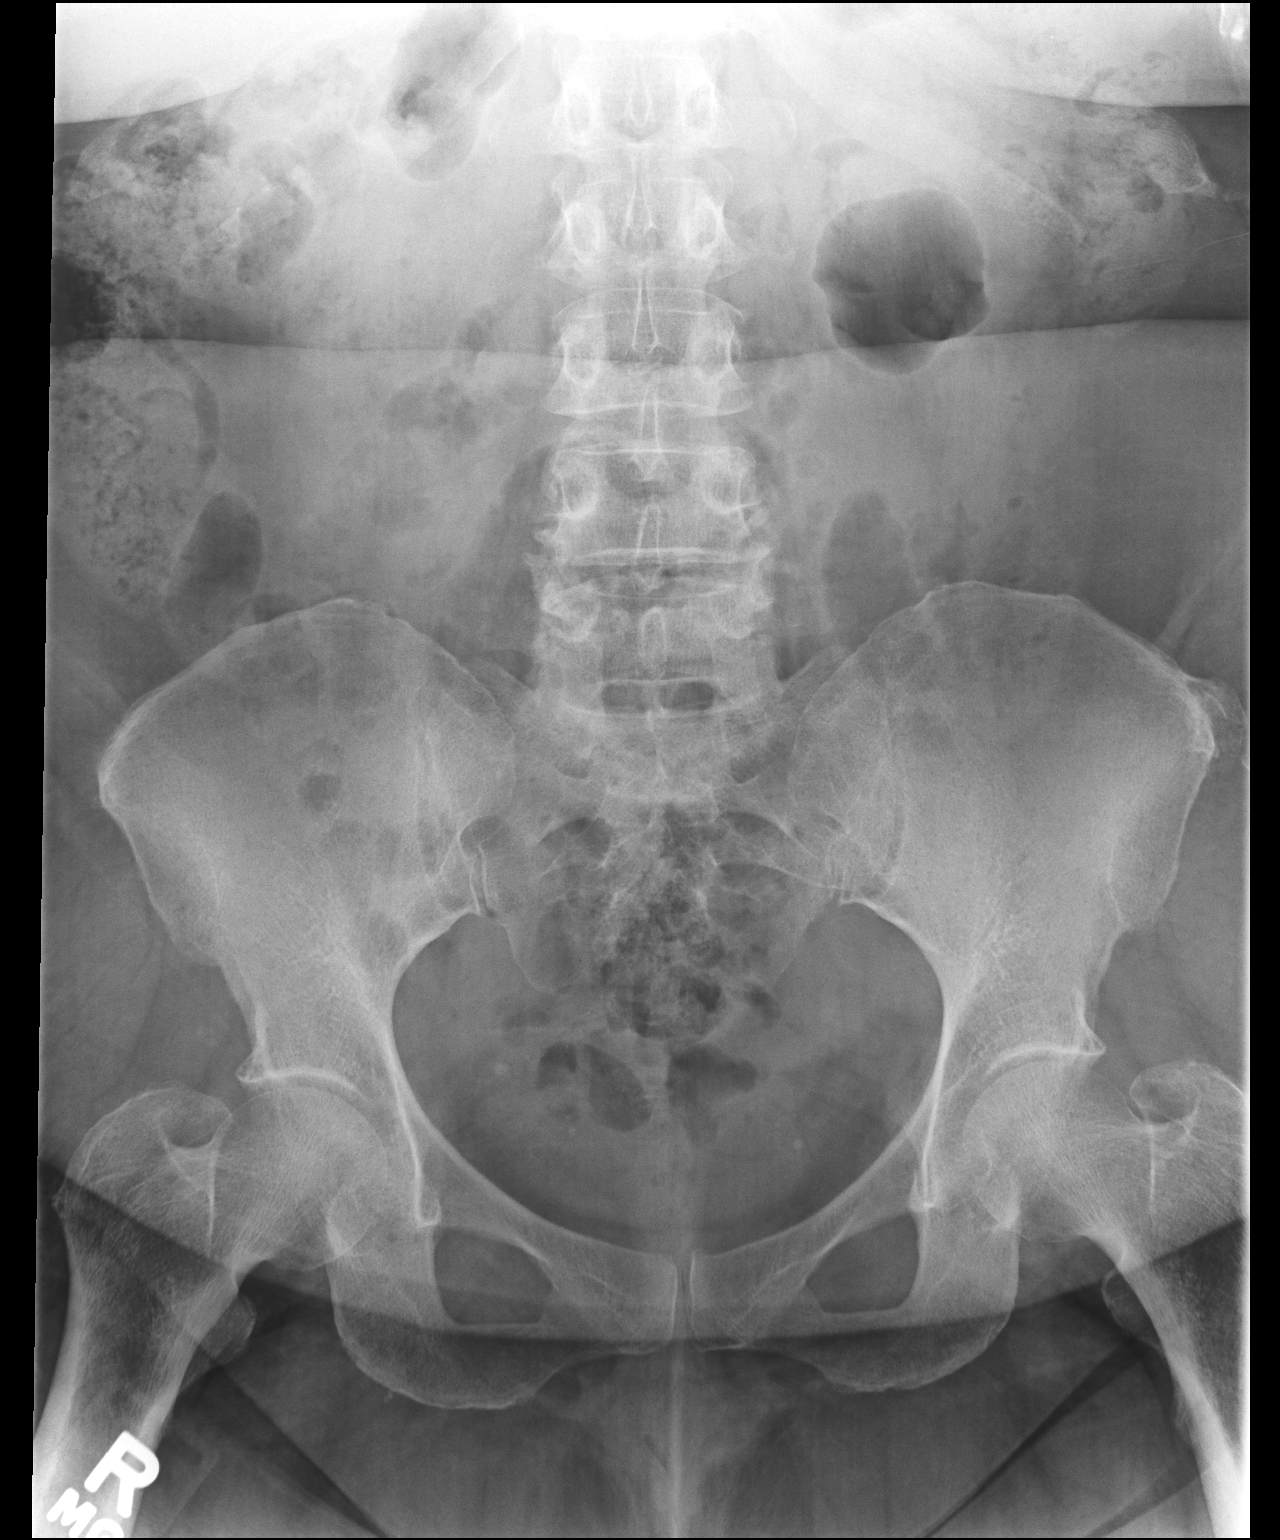

[dg lumbar spine 2-3 views (2 of 3)]
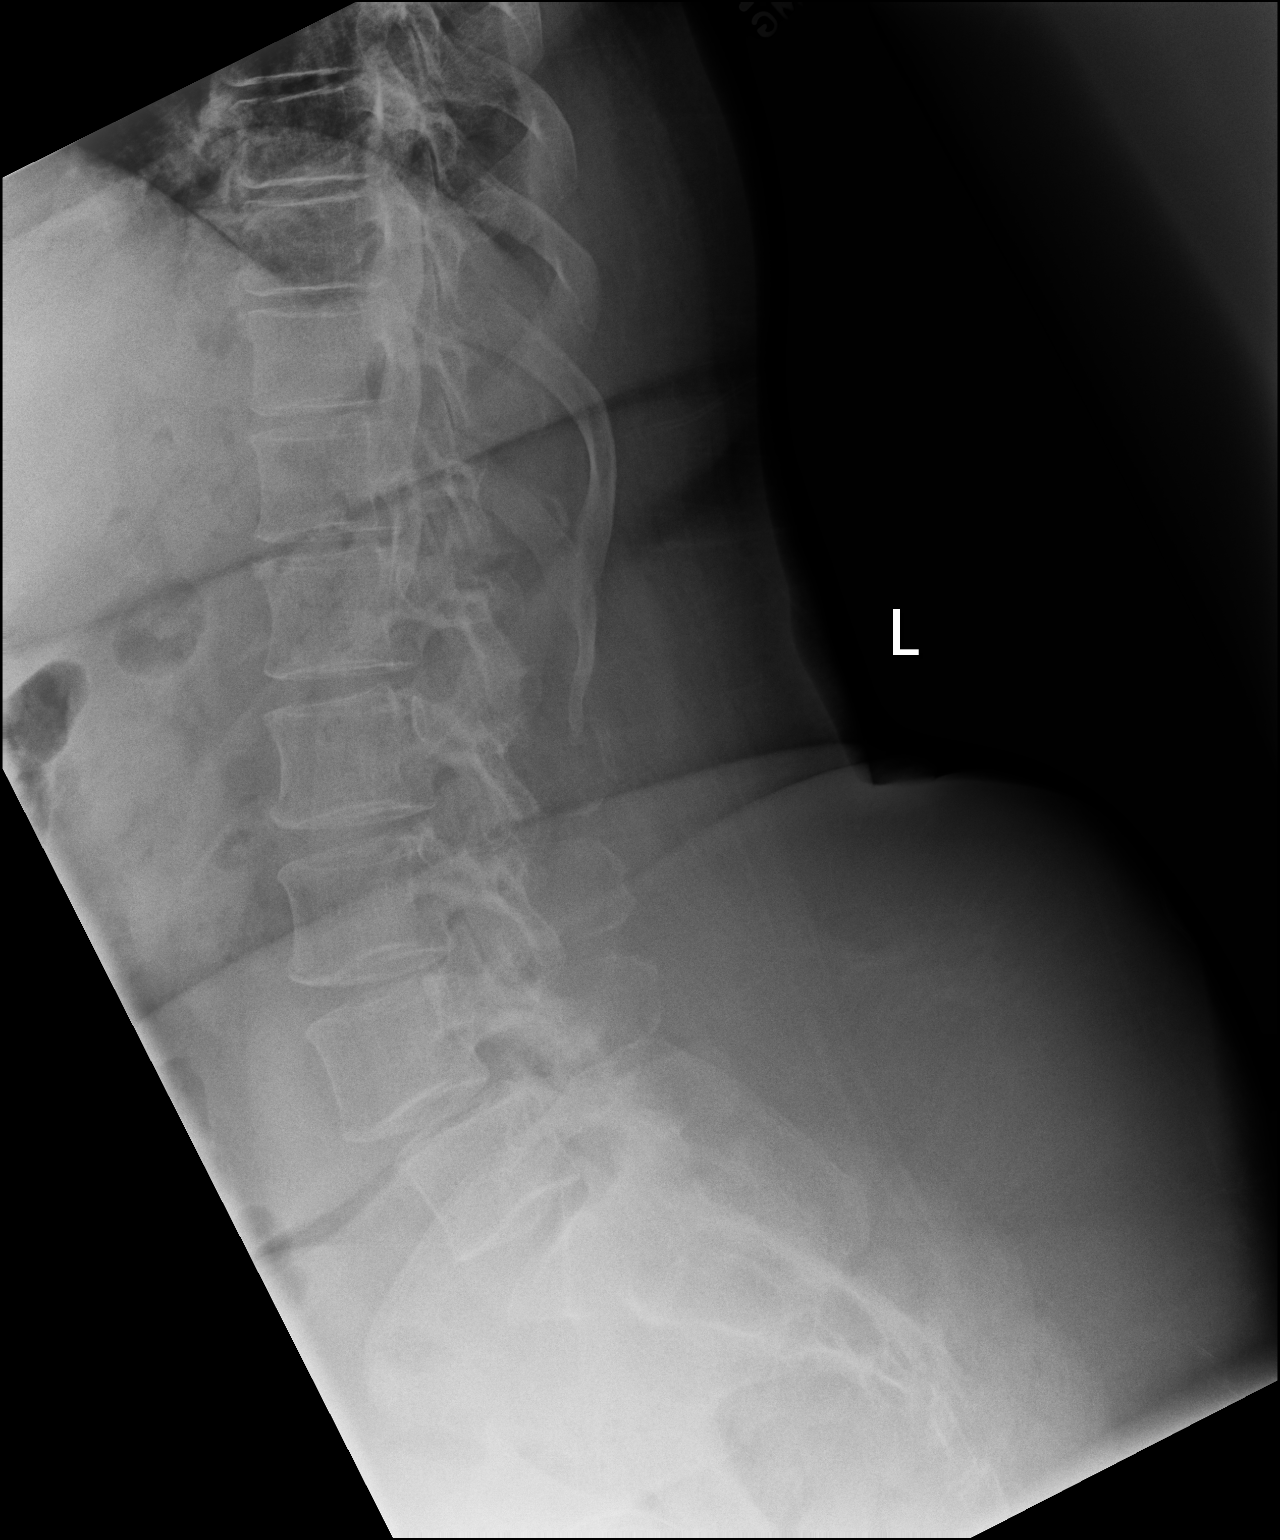

[dg lumbar spine 2-3 views (3 of 3)]
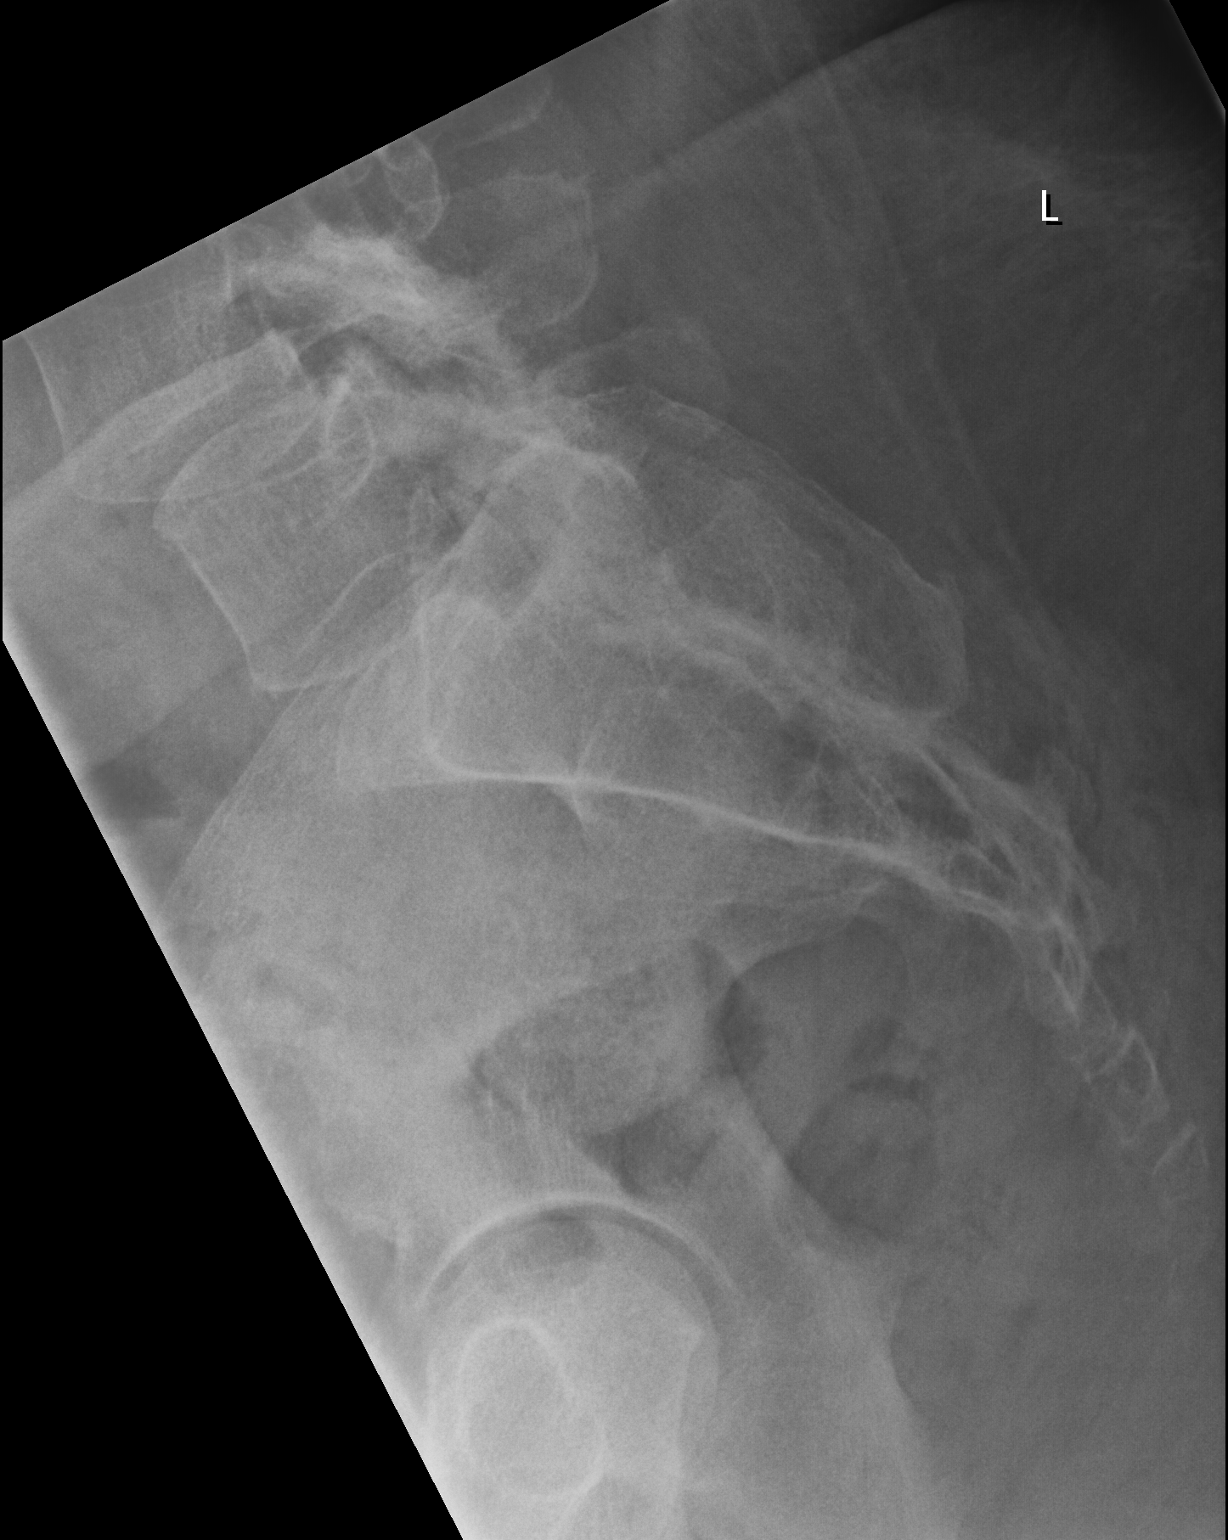

[3 of 3 positions shown; findings below may reference images not displayed]

FINDINGS: There is no acute compression fracture. There is a 7 mm
anterolisthesis of L4 on L5, presumably degenerative in etiology.
There is multilevel disc height loss throughout the lumbar spine
greatest at the L4-L5 and L5-S1 levels. There is facet arthrosis in
the lower lumbar segments.
IMPRESSION: 1. No acute compression fracture.
2. A 7 mm anterolisthesis of L4 on L5, presumably degenerative in
etiology.
3. Multilevel degenerative disc disease and facet arthrosis.

## 2020-04-29 NOTE — Progress Notes (Deleted)
Subjective:    Patient ID: Caroline Ryan, female    DOB: [birth year], 54 y.o.   MRN: [id number]  HPI  Caroline Ryan is a 54 year old woman who presents with bilateral knee OA. Last visit we did bilateral knee viscosupplementation injections that has helped her so much.   She wants to try a juice diet and help get her weight off. She has been eating fruits and vegetables.  She has been noting sharp pains shooting down her legs and arms. She felt weak when she had this pain. Her arm buckled when she had this pain.  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   She got a 30 day supply of Norco and this does help her.   Pain Inventory Average Pain 8 Pain Right Now 8 My pain is constant and burning  In the last 24 hours, has pain interfered with the following? General activity 6 Relation with others 0 Enjoyment of life 8 What TIME of day is your pain at its worst? night Sleep (in general) Fair  Pain is worse with: walking, bending and some activites Pain improves with: rest, heat/ice, therapy/exercise, pacing activities, medication and injections Relief from Meds: 9  Family History  Problem Relation Age of Onset  . Hypertension Father   . Diabetes Brother   . Diabetes Brother   . Breast cancer Mother 65  . Other Neg Hx    Social History   Socioeconomic History  . Marital status: Single    Spouse name: Not on file  . Number of children: Not on file  . Years of education: Not on file  . Highest education level: Not on file  Occupational History  . Not on file  Tobacco Use  . Smoking status: Former Smoker    Packs/day: 0.25    Years: 2.00    Pack years: 0.50    Types: Cigarettes    Quit date: 08/20/2014    Years since quitting: 5.6  . Smokeless tobacco: Never Used  Vaping Use  . Vaping Use: Never used  Substance and Sexual Activity  . Alcohol use: Yes    Alcohol/week: 2.0 standard drinks    Types: 2 Glasses of wine per week  . Drug use: No  . Sexual  activity: Yes    Birth control/protection: Condom  Other Topics Concern  . Not on file  Social History Narrative  . Not on file   Social Determinants of Health   Financial Resource Strain:   . Difficulty of Paying Living Expenses: Not on file  Food Insecurity:   . Worried About Charity fundraiser in the Last Year: Not on file  . Ran Out of Food in the Last Year: Not on file  Transportation Needs:   . Lack of Transportation (Medical): Not on file  . Lack of Transportation (Non-Medical): Not on file  Physical Activity:   . Days of Exercise per Week: Not on file  . Minutes of Exercise per Session: Not on file  Stress:   . Feeling of Stress : Not on file  Social Connections:   . Frequency of Communication with Friends and Family: Not on file  . Frequency of Social Gatherings with Friends and Family: Not on file  . Attends Religious Services: Not on file  . Active Member of Clubs or Organizations: Not on file  . Attends Archivist Meetings: Not on file  . Marital Status: Not on file   Past Surgical History:  Procedure Laterality Date  . ABDOMINAL HYSTERECTOMY    . BILATERAL SALPINGECTOMY Bilateral 10/10/2017   Procedure: BILATERAL SALPINGECTOMY;  Surgeon: Chancy Milroy, MD;  Location: [facility];  Service: Gynecology;  Laterality: Bilateral;  . BREAST BIOPSY     US guided core 2009  . BREAST EXCISIONAL BIOPSY     right 1982 left 1981  . BREAST SURGERY     bilateral cysts/benign  . VAGINAL HYSTERECTOMY N/A 10/10/2017   Procedure: HYSTERECTOMY VAGINAL;  Surgeon: Chancy Milroy, MD;  Location: [facility];  Service: Gynecology;  Laterality: N/A;   Past Surgical History:  Procedure Laterality Date  . ABDOMINAL HYSTERECTOMY    . BILATERAL SALPINGECTOMY Bilateral 10/10/2017   Procedure: BILATERAL SALPINGECTOMY;  Surgeon: Chancy Milroy, MD;  Location: [facility];  Service: Gynecology;  Laterality: Bilateral;  . BREAST BIOPSY     US guided core 2009  . BREAST EXCISIONAL  BIOPSY     right 1982 left 1981  . BREAST SURGERY     bilateral cysts/benign  . VAGINAL HYSTERECTOMY N/A 10/10/2017   Procedure: HYSTERECTOMY VAGINAL;  Surgeon: Chancy Milroy, MD;  Location: [facility];  Service: Gynecology;  Laterality: N/A;   Past Medical History:  Diagnosis Date  . Anemia   . Asthma   . Depression    no meds  . Eczema   . Fibroid   . Headache(784.0)    otc prn med  . History of blood transfusion   . Hypertension   . Knee pain, bilateral    arthralgia knee pain bilateral  . Seasonal allergies   . SVD (spontaneous vaginal delivery)    x 3   LMP 04/03/2017 (Approximate)   Opioid Risk Score:   Fall Risk Score:  `1  Depression screen PHQ 2/9  Depression screen [facility] 2/9 04/07/2020 03/04/2020 02/16/2020 01/22/2020 09/29/2019 12/24/2018 10/31/2018  Decreased Interest 1 1 1  0 1 0 0  Down, Depressed, Hopeless 1 1 1 1 1  0 0  PHQ - 2 Score 2 2 2 1 2  0 0  Altered sleeping - - 0 - 1 - -  Tired, decreased energy - - 1 - 3 - -  Change in appetite - - 0 - 1 - -  Feeling bad or failure about yourself  - - 0 - 1 - -  Trouble concentrating - - 0 - 0 - -  Moving slowly or fidgety/restless - - 0 - 3 - -  Suicidal thoughts - - 0 - 0 - -  PHQ-9 Score - - 3 - 11 - -  Difficult doing work/chores - - - - - - -  Some recent data might be hidden    Review of Systems  Constitutional: Negative.   HENT: Negative.   Eyes: Negative.   Respiratory: Negative.   Cardiovascular: Negative.   Endocrine: Negative.   Genitourinary: Negative.   Musculoskeletal: Positive for arthralgias and gait problem.  Skin: Negative.   Allergic/Immunologic: Negative.   Hematological: Negative.   Psychiatric/Behavioral: Negative.   All other systems reviewed and are negative.      Objective:   Physical Exam Gen: no distress, normal appearing HEENT: oral mucosa pink and moist, NCAT Cardio: Reg rate Chest: normal effort, normal rate of breathing Abd: soft, non-distended Ext: no edema Skin:  intact Neuro: Alert and oriented x3. Musculoskeletal: 5/5 strength throughout upper and lower extremities. Sensation intact.  Psych: pleasant, normal affect    Assessment & Plan:  Caroline Ryan is a 54 year old woman presenting  to establish care for bilateral chronic knee pain, neck pain.  1) Bilateral knee OA: -XRs reviewed and consistent with OA -She received steroid injections q7months. -She had viscosupplementation injections last visit with 3 weeks of very good relief.  -Vitamin D level normal.  -Prescribed compounding cream as follows: Diclofenac 3%, Gabapentin 5%, Lidocaine 5%, Menthol 1% compounded at [facility]:  apply 1-2 pumps three to four times per day as needed  2) Muscle cramps:magneisum level was normal last visit.  3) Morbid obesity: discussed intermittent fasting and low carb diet. She eats very healthy! Made goal to start eating at 11:15am instead of 11am and to move dinner time from 8:30pm to 8:15pm. Has lost three lbs in the past month! Advised that this is 18 lbs of the knees! -Check HbA1c today.   4) General health -reviewed to gynecology for pap smear -reviewed lipids and were normal last year.   5) Radicular symptoms in upper and lower extremities: -no evidence of weakness on exam to suggest myelopathy -obtain cervical and lumbar spine XRs. I will call next day for results. -Patient worried about heart attack- advised that symptoms appear to be more radicular and are not accompanied by chest pain.   All questions answered. RTC in 1 month to assess progress with above plan. Forty minutes of time spent in physical examination of patient; calling in script to [facility]; discussion of obesity and intermittent fasting and impact this will have on knee OA; discussion of benefits of viscosupplementation; referring to gynecology for pap smear and ordering HbA1c to assess for diabetes.

## 2020-05-03 ENCOUNTER — Other Ambulatory Visit: Payer: Self-pay | Admitting: Family Medicine

## 2020-05-03 ENCOUNTER — Telehealth: Payer: Self-pay | Admitting: Nurse Practitioner

## 2020-05-03 DIAGNOSIS — G8929 Other chronic pain: Secondary | ICD-10-CM

## 2020-05-03 DIAGNOSIS — M25562 Pain in left knee: Secondary | ICD-10-CM

## 2020-05-03 DIAGNOSIS — M170 Bilateral primary osteoarthritis of knee: Secondary | ICD-10-CM

## 2020-05-03 MED ORDER — HYDROCODONE-ACETAMINOPHEN 10-325 MG PO TABS: 1.0000 | ORAL_TABLET | Freq: Three times a day (TID) | ORAL | 0 refills | Status: AC | PRN

## 2020-05-03 MED ORDER — ACETAMINOPHEN 500 MG PO TABS: 1000.0000 mg | ORAL_TABLET | Freq: Two times a day (BID) | ORAL | 11 refills | Status: DC | PRN

## 2020-05-03 NOTE — Telephone Encounter (Signed)
Caroline Ryan has sent in a refill for her. I will also copy her in this thanks.

## 2020-05-03 NOTE — Telephone Encounter (Signed)
Done

## 2020-05-04 ENCOUNTER — Encounter: Payer: Medicaid Other | Admitting: Physical Medicine and Rehabilitation

## 2020-05-04 MED FILL — FUROSEMIDE 20 MG TABS: 20 | 90 days supply | Qty: 90 | Fill #2

## 2020-05-04 MED FILL — GABAPENTIN 300 MG CAPSULE: 300 | 90 days supply | Qty: 270 | Fill #2

## 2020-05-04 MED FILL — CETIRIZINE HCL 10 MG TABS: 10 | 34 days supply | Qty: 34 | Fill #2

## 2020-05-05 NOTE — Telephone Encounter (Signed)
Please check previous phone note

## 2020-05-06 ENCOUNTER — Other Ambulatory Visit: Payer: Self-pay | Admitting: Family Medicine

## 2020-05-06 DIAGNOSIS — I10 Essential (primary) hypertension: Secondary | ICD-10-CM

## 2020-05-06 DIAGNOSIS — R634 Abnormal weight loss: Secondary | ICD-10-CM

## 2020-05-06 DIAGNOSIS — L299 Pruritus, unspecified: Secondary | ICD-10-CM

## 2020-05-06 MED ORDER — HYDROXYZINE HCL 25 MG PO TABS: 25.0000 mg | ORAL_TABLET | Freq: Three times a day (TID) | ORAL | 11 refills | Status: DC | PRN

## 2020-05-06 MED ORDER — LISINOPRIL-HYDROCHLOROTHIAZIDE 20-25 MG PO TABS: 1.0000 | ORAL_TABLET | Freq: Every day | ORAL | 11 refills | Status: DC

## 2020-05-06 MED FILL — LISINOPRIL-HYDROCHLOROTHIAZ: 20-25 | 30 days supply | Qty: 30 | Fill #0

## 2020-05-06 MED FILL — hydrOXYzine HCL 25 MG TABS: 25 | 10 days supply | Qty: 30 | Fill #0

## 2020-05-20 ENCOUNTER — Other Ambulatory Visit: Payer: Self-pay | Admitting: Family Medicine

## 2020-05-20 ENCOUNTER — Ambulatory Visit (INDEPENDENT_AMBULATORY_CARE_PROVIDER_SITE_OTHER): Payer: Medicaid Other | Admitting: Family Medicine

## 2020-05-20 ENCOUNTER — Encounter: Payer: Self-pay | Admitting: Family Medicine

## 2020-05-20 ENCOUNTER — Telehealth: Payer: Self-pay | Admitting: Family Medicine

## 2020-05-20 ENCOUNTER — Other Ambulatory Visit: Payer: Self-pay

## 2020-05-20 VITALS — BP 132/94 | HR 89 | Temp 98.1°F | Ht 61.0 in | Wt 255.6 lb

## 2020-05-20 DIAGNOSIS — M25562 Pain in left knee: Secondary | ICD-10-CM | POA: Diagnosis not present

## 2020-05-20 DIAGNOSIS — M25561 Pain in right knee: Secondary | ICD-10-CM

## 2020-05-20 DIAGNOSIS — G8929 Other chronic pain: Secondary | ICD-10-CM | POA: Diagnosis not present

## 2020-05-20 DIAGNOSIS — Z09 Encounter for follow-up examination after completed treatment for conditions other than malignant neoplasm: Secondary | ICD-10-CM | POA: Diagnosis not present

## 2020-05-20 DIAGNOSIS — Z6841 Body Mass Index (BMI) 40.0 and over, adult: Secondary | ICD-10-CM

## 2020-05-20 DIAGNOSIS — J4520 Mild intermittent asthma, uncomplicated: Secondary | ICD-10-CM

## 2020-05-20 DIAGNOSIS — R52 Pain, unspecified: Secondary | ICD-10-CM | POA: Diagnosis not present

## 2020-05-20 DIAGNOSIS — N842 Polyp of vagina: Secondary | ICD-10-CM

## 2020-05-20 DIAGNOSIS — Z0000 Encounter for general adult medical examination without abnormal findings: Secondary | ICD-10-CM | POA: Diagnosis not present

## 2020-05-20 DIAGNOSIS — Z01419 Encounter for gynecological examination (general) (routine) without abnormal findings: Secondary | ICD-10-CM

## 2020-05-20 HISTORY — DX: Polyp of vagina: N84.2

## 2020-05-20 LAB — POCT URINALYSIS DIPSTICK
Bilirubin, UA: NEGATIVE
Glucose, UA: NEGATIVE
Ketones, UA: NEGATIVE
Leukocytes, UA: NEGATIVE
Nitrite, UA: NEGATIVE
Protein, UA: NEGATIVE
Spec Grav, UA: 1.02 (ref 1.010–1.025)
Urobilinogen, UA: 0.2 E.U./dL
pH, UA: 7.5 (ref 5.0–8.0)

## 2020-05-20 MED ORDER — ACETAMINOPHEN 500 MG PO TABS: 1000.0000 mg | ORAL_TABLET | Freq: Two times a day (BID) | ORAL | 11 refills | Status: DC | PRN

## 2020-05-20 MED ORDER — CETIRIZINE HCL 10 MG PO TABS
10.0000 mg | ORAL_TABLET | Freq: Every day | ORAL | 6 refills | Status: DC
  Filled 2021-01-27 – 2021-02-07 (×2): qty 30, 30d supply, fill #0

## 2020-05-20 MED ORDER — DICLOFENAC SODIUM 1 % EX GEL: 4.0000 g | Freq: Four times a day (QID) | CUTANEOUS | 3 refills | Status: DC

## 2020-05-20 MED ORDER — IBUPROFEN 800 MG PO TABS: 800.0000 mg | ORAL_TABLET | Freq: Three times a day (TID) | ORAL | 6 refills | Status: DC

## 2020-05-20 MED FILL — IBUPROFEN 800 MG TABLET: 800 | 30 days supply | Qty: 30 | Fill #0

## 2020-05-20 MED FILL — DICLOFENAC SODIUM 1% GEL: 1 | 6 days supply | Qty: 100 | Fill #0

## 2020-05-20 NOTE — Telephone Encounter (Signed)
Pt was called to schedule 6 mth fu per provider request. lvm

## 2020-05-20 NOTE — Progress Notes (Signed)
Patient [facility]    Pap Smear  Subjective:  Patient ID: Caroline Ryan, female    DOB: 1966-02-07  Age: 54 y.o. MRN: [id number]  CC:  Chief Complaint  Patient presents with  . Follow-up    Pt states she is here for a pap smear and get a refill on her medication.    HPI Caroline Ryan is a 54 year old female who presents for Pap Smear today.  Patient Active Problem List   Diagnosis Date Noted  . Unilateral primary osteoarthritis, left knee 10/22/2019  . Unilateral primary osteoarthritis, right knee 10/22/2019  . BMI 50.0-59.9, adult ([place]) 10/22/2019  . Grieving 06/10/2019  . Pruritus 06/10/2019  . Bilateral lower extremity edema 11/02/2018  . Post-operative state 10/10/2017  . Symptomatic anemia 05/20/2017  . Arthralgia of both knees 06/27/2015  . BACK STRAIN, LUMBAR 03/16/2010  . KNEE PAIN, BILATERAL 06/30/2009  . OBESITY 04/13/2009  . Edema 04/13/2009  . [place], BREAST 04/20/2008  . HYPERTENSION, BENIGN ESSENTIAL 04/14/2008  . Allergic rhinitis 04/14/2008  . Asthma 04/14/2008   Current Status: Since her last office visit, she is doing well with no complaints. She reports chronic bilateral knee pain, which she takes Motrin for aid in her pain relief. She was last referred to pain management on 01/22/2020, but she did not go. She denies visual changes, chest pain, cough, shortness of breath, heart palpitations, and falls. She has occasional headaches and dizziness with position changes. Denies severe headaches, confusion, seizures, double vision, and blurred vision, nausea and vomiting. She denies fevers, chills, fatigue, recent infections, weight loss, and night sweats. Denies GI problems such as nausea, vomiting, diarrhea, and constipation. She has no reports of blood in stools, dysuria and hematuria. No depression or anxiety reported today. She is taking all medications as prescribed.   Past Medical History:    Diagnosis Date  . Anemia   . Asthma   . Depression    no meds  . Eczema   . Fibroid   . Headache(784.0)    otc prn med  . History of blood transfusion   . Hypertension   . Knee pain, bilateral    arthralgia knee pain bilateral  . Seasonal allergies   . SVD (spontaneous vaginal delivery)    x 3  . Vaginal polyp 05/2020    Past Surgical History:  Procedure Laterality Date  . ABDOMINAL HYSTERECTOMY    . BILATERAL SALPINGECTOMY Bilateral 10/10/2017   Procedure: BILATERAL SALPINGECTOMY;  Surgeon: Chancy Milroy, MD;  Location: [facility];  Service: Gynecology;  Laterality: Bilateral;  . BREAST BIOPSY     US guided core 2009  . BREAST EXCISIONAL BIOPSY     right 1982 left 1981  . BREAST SURGERY     bilateral cysts/benign  . VAGINAL HYSTERECTOMY N/A 10/10/2017   Procedure: HYSTERECTOMY VAGINAL;  Surgeon: Chancy Milroy, MD;  Location: [facility];  Service: Gynecology;  Laterality: N/A;    Family History  Problem Relation Age of Onset  . Hypertension Father   . Diabetes Brother   . Diabetes Brother   . Breast cancer Mother 39  . Other Neg Hx     Social History   Socioeconomic History  . Marital status: Single    Spouse name: Not on file  . Number of children: Not on file  . Years of education: Not on file  . Highest education level: Not on file  Occupational History  . Not on  file  Tobacco Use  . Smoking status: Former Smoker    Packs/day: 0.25    Years: 2.00    Pack years: 0.50    Types: Cigarettes    Quit date: 08/20/2014    Years since quitting: 5.7  . Smokeless tobacco: Never Used  Vaping Use  . Vaping Use: Never used  Substance and Sexual Activity  . Alcohol use: Yes    Alcohol/week: 2.0 standard drinks    Types: 2 Glasses of wine per week  . Drug use: No  . Sexual activity: Yes    Birth control/protection: Condom  Other Topics Concern  . Not on file  Social History Narrative  . Not on file   Social Determinants of Health   Financial Resource  Strain:   . Difficulty of Paying Living Expenses: Not on file  Food Insecurity:   . Worried About Charity fundraiser in the Last Year: Not on file  . Ran Out of Food in the Last Year: Not on file  Transportation Needs:   . Lack of Transportation (Medical): Not on file  . Lack of Transportation (Non-Medical): Not on file  Physical Activity:   . Days of Exercise per Week: Not on file  . Minutes of Exercise per Session: Not on file  Stress:   . Feeling of Stress : Not on file  Social Connections:   . Frequency of Communication with Friends and Family: Not on file  . Frequency of Social Gatherings with Friends and Family: Not on file  . Attends Religious Services: Not on file  . Active Member of Clubs or Organizations: Not on file  . Attends Archivist Meetings: Not on file  . Marital Status: Not on file  Intimate Partner Violence:   . Fear of Current or Ex-Partner: Not on file  . Emotionally Abused: Not on file  . Physically Abused: Not on file  . Sexually Abused: Not on file    Outpatient Medications Prior to Visit  Medication Sig Dispense Refill  . albuterol (VENTOLIN HFA) 108 (90 Base) MCG/ACT inhaler Inhale 1 puff daily, every 6 hours as needed for shortness of breath. 18 g 12  . docusate sodium (COLACE) 100 MG capsule Take 1 capsule (100 mg total) by mouth 2 (two) times daily as needed. 30 capsule 2  . furosemide (LASIX) 20 MG tablet TAKE 1 TABLET (20 MG TOTAL) BY MOUTH DAILY. 30 tablet 6  . gabapentin (NEURONTIN) 300 MG capsule Take 1 capsule (300 mg total) by mouth 3 (three) times daily. 90 capsule 6  . HYDROcodone-acetaminophen (NORCO) 10-325 MG tablet Take 1 tablet by mouth every 8 (eight) hours as needed. 30 tablet 0  . hydrocortisone 2.5 % cream Apply topically 2 (two) times daily. 30 g 0  . hydrOXYzine (ATARAX/VISTARIL) 25 MG tablet Take 1 tablet (25 mg total) by mouth 3 (three) times daily as needed. 30 tablet 11  . lisinopril-hydrochlorothiazide (ZESTORETIC)  20-25 MG tablet Take 1 tablet by mouth daily. 30 tablet 11  . montelukast (SINGULAIR) 10 MG tablet Take 1 tablet (10 mg total) by mouth at bedtime. 30 tablet 6  . acetaminophen (TYLENOL) 500 MG tablet Take 2 tablets (1,000 mg total) by mouth 2 (two) times daily as needed. 120 tablet 11  . cetirizine (ZYRTEC) 10 MG tablet Take 1 tablet (10 mg total) by mouth daily. 30 tablet 6  . diclofenac Sodium (VOLTAREN) 1 % GEL APPLY 2 G TOPICALLY 4 (FOUR) TIMES DAILY. 100 g 3  .  ibuprofen (ADVIL) 800 MG tablet Take 1 tablet (800 mg total) by mouth 3 (three) times daily. 30 tablet 5   No facility-administered medications prior to visit.    No Known Allergies  ROS Review of Systems  Constitutional: Negative.   HENT: Negative.   Eyes: Negative.   Respiratory: Negative.   Cardiovascular: Negative.   Gastrointestinal: Positive for abdominal distention (Obese).  Endocrine: Negative.   Genitourinary: Negative.   Musculoskeletal: Positive for arthralgias (Generalized joint pain) and back pain (chronic).  Skin: Negative.   Allergic/Immunologic: Negative.   Neurological: Positive for dizziness (occasional) and headaches (occasional).  Hematological: Negative.   Psychiatric/Behavioral: Negative.    Objective:    Physical Exam Vitals and nursing note reviewed.  Constitutional:      Appearance: Normal appearance. She is obese.  HENT:     Head: Normocephalic and atraumatic.     Nose: Nose normal.     Mouth/Throat:     Mouth: Mucous membranes are moist.     Pharynx: Oropharynx is clear.  Cardiovascular:     Rate and Rhythm: Normal rate and regular rhythm.     Pulses: Normal pulses.     Heart sounds: Normal heart sounds.  Pulmonary:     Effort: Pulmonary effort is normal.     Breath sounds: Normal breath sounds.  Abdominal:     General: Bowel sounds are normal. There is distension (obese).     Palpations: Abdomen is soft.  Genitourinary:   Musculoskeletal:        General: Normal range of  motion.     Cervical back: Normal range of motion and neck supple.  Skin:    General: Skin is warm and dry.  Neurological:     General: No focal deficit present.     Mental Status: She is alert and oriented to person, place, and time.  Psychiatric:        Mood and Affect: Mood normal.        Behavior: Behavior normal.        Thought Content: Thought content normal.        Judgment: Judgment normal.     BP (!) 132/94 (BP Location: Left Arm, Patient Position: Sitting, Cuff Size: Large)   Pulse 89   Temp 98.1 F (36.7 C)   Ht 5\' 1"  (1.549 m)   Wt 255 lb 9.6 oz (115.9 kg)   LMP 04/03/2017 (Approximate)   SpO2 96%   BMI 48.30 kg/m  Wt Readings from Last 3 Encounters:  05/20/20 255 lb 9.6 oz (115.9 kg)  04/12/20 255 lb (115.7 kg)  04/07/20 257 lb (116.6 kg)     Health Maintenance Due  Topic Date Due  . COLONOSCOPY  Never done  . PAP SMEAR-Modifier  02/22/2020    There are no preventive care reminders to display for this patient.  Lab Results  Component Value Date   TSH 1.530 12/24/2018   Lab Results  Component Value Date   WBC 6.6 12/24/2018   HGB 11.4 12/24/2018   HCT 35.1 12/24/2018   MCV 94 12/24/2018   PLT 170 12/24/2018   Lab Results  Component Value Date   NA 142 02/16/2020   K 4.5 02/16/2020   CO2 26 02/16/2020   GLUCOSE 108 (H) 02/16/2020   BUN 33 (H) 02/16/2020   CREATININE 0.78 02/16/2020   BILITOT 0.3 12/24/2018   ALKPHOS 102 12/24/2018   AST 13 12/24/2018   ALT 17 12/24/2018   PROT 7.2 12/24/2018   ALBUMIN 4.4  12/24/2018   CALCIUM 10.5 (H) 02/16/2020   ANIONGAP 7 10/11/2017   Lab Results  Component Value Date   CHOL 180 12/24/2018   Lab Results  Component Value Date   HDL 72 12/24/2018   Lab Results  Component Value Date   LDLCALC 97 12/24/2018   Lab Results  Component Value Date   TRIG 57 12/24/2018   Lab Results  Component Value Date   CHOLHDL 2.5 12/24/2018   Lab Results  Component Value Date   HGBA1C 5.2 04/07/2020        Assessment & Plan:   1. Encounter for cervical Pap smear with pelvic exam Patient tolerated exam well with no complaints. Pap Smear results are pending.  - Pap IG w/ reflex to HPV when ASC-U ([facility])  - IGP, rfx Aptima HPV ASCU  2. Vaginal polyp - Ambulatory referral to Obstetrics / Gynecology  3. Morbid obesity with BMI of 45.0-49.9, adult ([place])  4. BMI 50.0-59.9, adult ([facility]) Body mass index is 48.3 kg/m.  Goal BMI  is <30. Encouraged efforts to reduce weight include engaging in physical activity as tolerated with goal of 150 minutes per week. Improve dietary choices and eat a meal regimen consistent with a Mediterranean or DASH diet. Reduce simple carbohydrates. Do not skip meals and eat healthy snacks throughout the day to avoid over-eating at dinner. Set a goal weight loss that is achievable for you.  5. Mild intermittent asthma, unspecified whether complicated Stable. No signs or symptoms of respiratory distress noted or reported.  - cetirizine (ZYRTEC) 10 MG tablet; Take 1 tablet (10 mg total) by mouth daily.  Dispense: 30 tablet; Refill: 6  6. Chronic pain of both knees L>R - acetaminophen (TYLENOL) 500 MG tablet; Take 2 tablets (1,000 mg total) by mouth 2 (two) times daily as needed.  Dispense: 120 tablet; Refill: 11  7. Chronic pain of right knee - diclofenac Sodium (VOLTAREN) 1 % GEL; Apply 4 g topically 4 (four) times daily.  Dispense: 100 g; Refill: 3  8. Pain aggravated by activities of daily living - ibuprofen (ADVIL) 800 MG tablet; Take 1 tablet (800 mg total) by mouth 3 (three) times daily.  Dispense: 30 tablet; Refill: 6  9. Healthcare maintenance - Urinalysis Dipstick - CBC with Differential - Comprehensive metabolic panel - Lipid Panel - TSH - Vitamin D, 25-hydroxy - Vitamin B12  10. Follow up She will follow up in 6 months.   Meds ordered this encounter  Medications  . cetirizine (ZYRTEC) 10 MG tablet    Sig: Take 1 tablet (10 mg total)  by mouth daily.    Dispense:  30 tablet    Refill:  6  . acetaminophen (TYLENOL) 500 MG tablet    Sig: Take 2 tablets (1,000 mg total) by mouth 2 (two) times daily as needed.    Dispense:  120 tablet    Refill:  11  . diclofenac Sodium (VOLTAREN) 1 % GEL    Sig: Apply 4 g topically 4 (four) times daily.    Dispense:  100 g    Refill:  3  . ibuprofen (ADVIL) 800 MG tablet    Sig: Take 1 tablet (800 mg total) by mouth 3 (three) times daily.    Dispense:  30 tablet    Refill:  6    Orders Placed This Encounter  Procedures  . CBC with Differential  . Comprehensive metabolic panel  . Lipid Panel  . TSH  . Vitamin D, 25-hydroxy  .  Vitamin B12  . Ambulatory referral to Obstetrics / Gynecology  . Urinalysis Dipstick     Referral Orders     Ambulatory referral to Obstetrics / Gynecology   Kathe Becton,  MSN, FNP-BC [place], [place] [address] [phone] fax  Problem List Items Addressed This Visit      Respiratory   Asthma   Relevant Medications   cetirizine (ZYRTEC) 10 MG tablet     Other   BMI 50.0-59.9, adult ([place])    Other Visit Diagnoses    Encounter for cervical Pap smear with pelvic exam    -  Primary   Relevant Orders   IGP, rfx Aptima HPV ASCU   Vaginal polyp       Relevant Orders   Ambulatory referral to Obstetrics / Gynecology   Morbid obesity with BMI of 45.0-49.9, adult ([place])       Chronic pain of both knees L>R       referral to Pain mgmt One time Rx for hydrocodone/APAP to assist with chronic    Relevant Medications   acetaminophen (TYLENOL) 500 MG tablet   ibuprofen (ADVIL) 800 MG tablet   Chronic pain of right knee       Relevant Medications   diclofenac Sodium (VOLTAREN) 1 % GEL   Pain aggravated by activities of daily living       Relevant Medications   ibuprofen (ADVIL) 800 MG tablet   Healthcare maintenance        Relevant Orders   Urinalysis Dipstick (Completed)   CBC with Differential   Comprehensive metabolic panel   Lipid Panel   TSH   Vitamin D, 25-hydroxy   Vitamin B12   Follow up          Meds ordered this encounter  Medications  . cetirizine (ZYRTEC) 10 MG tablet    Sig: Take 1 tablet (10 mg total) by mouth daily.    Dispense:  30 tablet    Refill:  6  . acetaminophen (TYLENOL) 500 MG tablet    Sig: Take 2 tablets (1,000 mg total) by mouth 2 (two) times daily as needed.    Dispense:  120 tablet    Refill:  11  . diclofenac Sodium (VOLTAREN) 1 % GEL    Sig: Apply 4 g topically 4 (four) times daily.    Dispense:  100 g    Refill:  3  . ibuprofen (ADVIL) 800 MG tablet    Sig: Take 1 tablet (800 mg total) by mouth 3 (three) times daily.    Dispense:  30 tablet    Refill:  6    Follow-up: Return in about 6 months (around 11/18/2020).    [name], FNP

## 2020-05-24 ENCOUNTER — Other Ambulatory Visit: Payer: Self-pay

## 2020-05-24 ENCOUNTER — Encounter: Payer: Self-pay | Admitting: Physical Medicine and Rehabilitation

## 2020-05-24 ENCOUNTER — Encounter
Payer: Medicaid Other | Attending: Physical Medicine and Rehabilitation | Admitting: Physical Medicine and Rehabilitation

## 2020-05-24 ENCOUNTER — Encounter: Payer: Self-pay | Admitting: Family Medicine

## 2020-05-24 VITALS — BP 122/80 | HR 83 | Temp 98.5°F | Ht 61.0 in | Wt 257.0 lb

## 2020-05-24 DIAGNOSIS — Z5181 Encounter for therapeutic drug level monitoring: Secondary | ICD-10-CM | POA: Diagnosis not present

## 2020-05-24 DIAGNOSIS — D219 Benign neoplasm of connective and other soft tissue, unspecified: Secondary | ICD-10-CM

## 2020-05-24 DIAGNOSIS — M170 Bilateral primary osteoarthritis of knee: Secondary | ICD-10-CM | POA: Diagnosis not present

## 2020-05-24 DIAGNOSIS — N951 Menopausal and female climacteric states: Secondary | ICD-10-CM

## 2020-05-24 DIAGNOSIS — Z79891 Long term (current) use of opiate analgesic: Secondary | ICD-10-CM

## 2020-05-24 NOTE — Addendum Note (Signed)
Addended by: Geryl Rankins D on: 05/24/2020 02:13 PM   Modules accepted: Orders

## 2020-05-24 NOTE — Progress Notes (Signed)
Subjective:    Patient ID: Caroline Ryan, female    DOB: [birth year], 54 y.o.   MRN: [id number]  HPI  Mrs. Bobb is a 54 year old woman who presents with bilateral knee OA. Last visit we did bilateral knee viscosupplementation injections that has helped her so much.   She was helping her dad move last night.   Her BP is well controlled.   She is taking the Norco about twice per day. This is the only medication that helps to ease her pain.   She hopes to be able to do shopping and spend time with her daughter.   She is 257 lbs, same weight is last time.   She is walking more than last month.   She wants to try a juice diet and help get her weight off. She has been eating fruits and vegetables.  She has been noting sharp pains shooting down her legs and arms. She felt weak when she had this pain. Her arm buckled when she had this pain.  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   Pain is 9/10 on average, and 7/10 right now.   She was diagnosed with fibroids and is stressed by the fact that she has not heard from the gynecologist yet.   She is also having hot flashes from menopause and asks what kind of treatments are available.   Pain Inventory Average Pain 9 Pain Right Now 7 My pain is constant, burning and aching  In the last 24 hours, has pain interfered with the following? General activity 8 Relation with others 8 Enjoyment of life 9 What TIME of day is your pain at its worst? morning  and night Sleep (in general) Fair  Pain is worse with: walking and some activites Pain improves with: heat/ice, medication and injections Relief from Meds: 10  Family History  Problem Relation Age of Onset  . Hypertension Father   . Diabetes Brother   . Diabetes Brother   . Breast cancer Mother 35  . Other Neg Hx    Social History   Socioeconomic History  . Marital status: Single    Spouse name: Not on file  . Number of children: Not on file  . Years of  education: Not on file  . Highest education level: Not on file  Occupational History  . Not on file  Tobacco Use  . Smoking status: Former Smoker    Packs/day: 0.25    Years: 2.00    Pack years: 0.50    Types: Cigarettes    Quit date: 08/20/2014    Years since quitting: 5.7  . Smokeless tobacco: Never Used  Vaping Use  . Vaping Use: Never used  Substance and Sexual Activity  . Alcohol use: Yes    Alcohol/week: 2.0 standard drinks    Types: 2 Glasses of wine per week  . Drug use: No  . Sexual activity: Yes    Birth control/protection: Condom  Other Topics Concern  . Not on file  Social History Narrative  . Not on file   Social Determinants of Health   Financial Resource Strain:   . Difficulty of Paying Living Expenses: Not on file  Food Insecurity:   . Worried About Charity fundraiser in the Last Year: Not on file  . Ran Out of Food in the Last Year: Not on file  Transportation Needs:   . Lack of Transportation (Medical): Not on file  . Lack of Transportation (  Non-Medical): Not on file  Physical Activity:   . Days of Exercise per Week: Not on file  . Minutes of Exercise per Session: Not on file  Stress:   . Feeling of Stress : Not on file  Social Connections:   . Frequency of Communication with Friends and Family: Not on file  . Frequency of Social Gatherings with Friends and Family: Not on file  . Attends Religious Services: Not on file  . Active Member of Clubs or Organizations: Not on file  . Attends Archivist Meetings: Not on file  . Marital Status: Not on file   Past Surgical History:  Procedure Laterality Date  . ABDOMINAL HYSTERECTOMY    . BILATERAL SALPINGECTOMY Bilateral 10/10/2017   Procedure: BILATERAL SALPINGECTOMY;  Surgeon: Chancy Milroy, MD;  Location: [facility];  Service: Gynecology;  Laterality: Bilateral;  . BREAST BIOPSY     US guided core 2009  . BREAST EXCISIONAL BIOPSY     right 1982 left 1981  . BREAST SURGERY     bilateral  cysts/benign  . VAGINAL HYSTERECTOMY N/A 10/10/2017   Procedure: HYSTERECTOMY VAGINAL;  Surgeon: Chancy Milroy, MD;  Location: [facility];  Service: Gynecology;  Laterality: N/A;   Past Surgical History:  Procedure Laterality Date  . ABDOMINAL HYSTERECTOMY    . BILATERAL SALPINGECTOMY Bilateral 10/10/2017   Procedure: BILATERAL SALPINGECTOMY;  Surgeon: Chancy Milroy, MD;  Location: [facility];  Service: Gynecology;  Laterality: Bilateral;  . BREAST BIOPSY     US guided core 2009  . BREAST EXCISIONAL BIOPSY     right 1982 left 1981  . BREAST SURGERY     bilateral cysts/benign  . VAGINAL HYSTERECTOMY N/A 10/10/2017   Procedure: HYSTERECTOMY VAGINAL;  Surgeon: Chancy Milroy, MD;  Location: [facility];  Service: Gynecology;  Laterality: N/A;   Past Medical History:  Diagnosis Date  . Anemia   . Asthma   . Depression    no meds  . Eczema   . Fibroid   . Headache(784.0)    otc prn med  . History of blood transfusion   . Hypertension   . Knee pain, bilateral    arthralgia knee pain bilateral  . Seasonal allergies   . SVD (spontaneous vaginal delivery)    x 3  . Vaginal polyp 05/2020   LMP 04/03/2017 (Approximate)   Opioid Risk Score:   Fall Risk Score:  `1  Depression screen PHQ 2/9  Depression screen [facility] 2/9 05/20/2020 04/07/2020 03/04/2020 02/16/2020 01/22/2020 09/29/2019 12/24/2018  Decreased Interest 0 1 1 1  0 1 0  Down, Depressed, Hopeless 1 1 1 1 1 1  0  PHQ - 2 Score 1 2 2 2 1 2  0  Altered sleeping - - - 0 - 1 -  Tired, decreased energy - - - 1 - 3 -  Change in appetite - - - 0 - 1 -  Feeling bad or failure about yourself  - - - 0 - 1 -  Trouble concentrating - - - 0 - 0 -  Moving slowly or fidgety/restless - - - 0 - 3 -  Suicidal thoughts - - - 0 - 0 -  PHQ-9 Score - - - 3 - 11 -  Difficult doing work/chores - - - - - - -  Some recent data might be hidden    Review of Systems  Constitutional: Negative.   HENT: Negative.   Eyes: Negative.   Respiratory: Negative.     Cardiovascular: Negative.  Gastrointestinal: Negative.   Endocrine: Negative.   Genitourinary: Negative.   Musculoskeletal: Positive for arthralgias and gait problem.  Skin: Negative.   Allergic/Immunologic: Negative.   Hematological: Negative.   Psychiatric/Behavioral: Negative.   All other systems reviewed and are negative.      Objective:   Physical Exam Gen: no distress, normal appearing HEENT: oral mucosa pink and moist, NCAT Cardio: Reg rate Chest: normal effort, normal rate of breathing Abd: soft, non-distended Ext: no edema Skin: intact Neuro: Alert and oriented x3. Musculoskeletal: 5/5 strength throughout upper and lower extremities. Sensation intact. No tenderness to palpation, but she has knee pain on sit-to-stand.  Psych: pleasant, normal affect     Assessment & Plan:  Mrs. Saffran is a 55 year old woman presenting to establish care for bilateral chronic knee pain, neck pain.  1) Bilateral knee OA: -XRs reviewed and consistent with OA -She received steroid injections q70months. -She had viscosupplementation injections in July.  -Vitamin D level normal.  -Prescribed compounding cream as follows: Diclofenac 3%, Gabapentin 5%, Lidocaine 5%, Menthol 1% compounded at [facility]:  apply 1-2 pumps three to four times per day as needed -Discussed her walking.Made goal to increase to 15 minutes per day.  -Discussed that every pound she loses is 6 lbs off her knees.  -Pain contract signed today and UDS sample obtained -Advised that it the Gabapentin may be making her sleepy. Educated that Gabapentin can help with her hot flashes.   2) Muscle cramps:magneisum level was normal last visit.  3) Morbid obesity: Weight is 257 lbs, which is same as last visit. Discussed intermittent fasting and low carb diet. She eats very healthy! Made goal to start eating at 11:15am instead of 11am and to move dinner time from 8:30pm to 8:15pm. HgbA1C 5.2- discussed that there is  no prediabetes  4) General health -Reviewed to gynecology for pap smear -Reviewed lipids and were normal last year.   5) Radicular symptoms in upper and lower extremities: -no evidence of weakness on exam to suggest myelopathy -Cervical XR results reviewed with her: they show mild spondylosis at C5-6 and C6-7.  No acute fracture. Lumbar XR reviewed with patient and shows 1. No acute compression fracture. 2. A 7 mm anterolisthesis of L4 on L5, presumably degenerative in etiology. 3. Multilevel degenerative disc disease and facet arthrosis. -Patient worried about heart attack- advised that symptoms appear to be more radicular and are not accompanied by chest pain.   6) Palpitations:  -She has had occasional episodes -Denies chest pain. She gets SOB due to her asthma.   40 minutes spent in discussion of her bilateral knee pain, hot flashes, her medications, reviewing cervical and lumbar XRs, fibroids, her weight, walking, her diet, pain contract, and urine sample, viscosupplementation

## 2020-05-25 LAB — IGP, RFX APTIMA HPV ASCU

## 2020-05-26 MED FILL — LISINOPRIL-HYDROCHLOROTHIAZ: 20-25 | 30 days supply | Qty: 30 | Fill #0

## 2020-05-26 MED FILL — hydrOXYzine HCL 25 MG TABS: 25 | 10 days supply | Qty: 30 | Fill #0

## 2020-05-26 NOTE — Progress Notes (Signed)
Pt was called to discuss her lab results. Pt stated she understood.

## 2020-05-26 NOTE — Progress Notes (Signed)
Pt was called to discuss her lab result. Pt was happy to hear her results.

## 2020-05-27 LAB — TOXASSURE SELECT,+ANTIDEPR,UR

## 2020-06-01 ENCOUNTER — Telehealth: Payer: Self-pay | Admitting: *Deleted

## 2020-06-01 NOTE — Telephone Encounter (Signed)
Urine drug screen is positive for alcohol, as well as hydrocodone and oxycodone.  She is only prescribed hydrocodone by the pmp.Marland Kitchen

## 2020-06-01 NOTE — Telephone Encounter (Signed)
Thank you Sybil, if you can please inform her of this- we are not able to prescribe for her unless she has a urine sample with expected metabolites.

## 2020-06-02 ENCOUNTER — Other Ambulatory Visit: Payer: Self-pay | Admitting: Family Medicine

## 2020-06-02 DIAGNOSIS — L309 Dermatitis, unspecified: Secondary | ICD-10-CM

## 2020-06-02 MED FILL — PROAIR HFA 90 MCG INHALER: 108 (90 BAS | 25 days supply | Qty: 9 | Fill #0

## 2020-06-02 NOTE — Telephone Encounter (Signed)
A letter has been mailed to Caroline Ryan informing her of the inconsistent urine drug screen.

## 2020-06-03 ENCOUNTER — Telehealth: Payer: Self-pay | Admitting: Family Medicine

## 2020-06-06 NOTE — Telephone Encounter (Signed)
Sent to Provider and CMA

## 2020-06-08 ENCOUNTER — Other Ambulatory Visit: Payer: Self-pay | Admitting: Family Medicine

## 2020-06-08 DIAGNOSIS — L309 Dermatitis, unspecified: Secondary | ICD-10-CM

## 2020-06-08 MED ORDER — HYDROCORTISONE 2.5 % EX CREA: TOPICAL_CREAM | Freq: Two times a day (BID) | CUTANEOUS | 1 refills | Status: DC

## 2020-06-09 ENCOUNTER — Telehealth: Payer: Self-pay | Admitting: *Deleted

## 2020-06-09 MED ORDER — NONFORMULARY OR COMPOUNDED ITEM: 0 refills | Status: DC

## 2020-06-09 MED ORDER — NONFORMULARY OR COMPOUNDED ITEM: 0 refills | Status: AC

## 2020-06-09 NOTE — Telephone Encounter (Signed)
Yes. Faxed Rx and face sheet to [facility] on [address]. [phone]. Ms Rhyner notified.

## 2020-06-09 NOTE — Telephone Encounter (Signed)
Mrs Nyquist called about getting pain medication. I called her back and let her know that we cannot prescribe a narcotic for her at this time because of her UDS result. I let her know we mailed her a letter.  She is asking about the cream Dr Ranell Patrick said she was going to order.  I see mention of it in the note but I do not see an order for it to [facility]. If it was not sent in, Mrs Pha is actually closer to [place] (compounding pharmacy) on [address].  The order for the cream is usually ordered in Epic as "non formulary compounded item"

## 2020-06-09 NOTE — Telephone Encounter (Signed)
Thanks so much Sybil! Would you be able to send this compounded cream- dispense 100g? Thank you!!

## 2020-06-10 ENCOUNTER — Telehealth: Payer: Self-pay | Admitting: Family Medicine

## 2020-06-10 NOTE — Telephone Encounter (Signed)
Done

## 2020-06-23 ENCOUNTER — Encounter: Payer: Self-pay | Admitting: Physical Medicine and Rehabilitation

## 2020-06-23 ENCOUNTER — Encounter
Payer: Medicaid Other | Attending: Physical Medicine and Rehabilitation | Admitting: Physical Medicine and Rehabilitation

## 2020-06-23 ENCOUNTER — Other Ambulatory Visit: Payer: Self-pay

## 2020-06-23 VITALS — BP 159/99 | HR 79 | Temp 98.7°F | Ht 61.0 in | Wt 255.0 lb

## 2020-06-23 DIAGNOSIS — M170 Bilateral primary osteoarthritis of knee: Secondary | ICD-10-CM | POA: Insufficient documentation

## 2020-06-23 NOTE — Progress Notes (Signed)
Subjective:    Patient ID: Caroline Ryan, female    DOB: 11-23-1965, 54 y.o.   MRN: [id number]  HPI  The cream was too expensive for her at [facility]. She has been trying to walk. Her mood is a little more down today. The handicap sticker allows her to go out more. Current pain is 8/10. She is about to walk 15-20 minutes three times per week. She would like to repeat Monovisc next visit in December.   Weight is 255 lbs.   She is feeling very fatigued today. I have bene unable to get her into see the OBGYN as they did not accept her insurance.   Prior history:  Caroline Ryan is a 54 year old woman who presents with bilateral knee OA. Last visit we did bilateral knee viscosupplementation injections that has helped her so much.   She was helping her dad move last night.   Her BP is well controlled.   She is taking the Norco about twice per day. This is the only medication that helps to ease her pain.   She hopes to be able to do shopping and spend time with her daughter.   She is 257 lbs, same weight is last time.   She is walking more than last month.   She wants to try a juice diet and help get her weight off. She has been eating fruits and vegetables.  She has been noting sharp pains shooting down her legs and arms. She felt weak when she had this pain. Her arm buckled when she had this pain.  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   Pain is 9/10 on average, and 7/10 right now.   She was diagnosed with fibroids and is stressed by the fact that she has not heard from the gynecologist yet.   She is also having hot flashes from menopause and asks what kind of treatments are available.   Pain Inventory Average Pain 9 Pain Right Now 10 My pain is constant, sharp and burning  In the last 24 hours, has pain interfered with the following? General activity 8 Relation with others 9 Enjoyment of life 9 What TIME of day is your pain at its worst? night Sleep (in  general) Fair  Pain is worse with: walking, sitting and some activites Pain improves with: therapy/exercise, medication, TENS and injections Relief from Meds: 10  Family History  Problem Relation Age of Onset  . Hypertension Father   . Diabetes Brother   . Diabetes Brother   . Breast cancer Mother 15  . Other Neg Hx    Social History   Socioeconomic History  . Marital status: Single    Spouse name: Not on file  . Number of children: Not on file  . Years of education: Not on file  . Highest education level: Not on file  Occupational History  . Not on file  Tobacco Use  . Smoking status: Former Smoker    Packs/day: 0.25    Years: 2.00    Pack years: 0.50    Types: Cigarettes    Quit date: 08/20/2014    Years since quitting: 5.8  . Smokeless tobacco: Never Used  Vaping Use  . Vaping Use: Never used  Substance and Sexual Activity  . Alcohol use: Yes    Alcohol/week: 2.0 standard drinks    Types: 2 Glasses of wine per week  . Drug use: No  . Sexual activity: Yes  Birth control/protection: Condom  Other Topics Concern  . Not on file  Social History Narrative  . Not on file   Social Determinants of Health   Financial Resource Strain:   . Difficulty of Paying Living Expenses: Not on file  Food Insecurity:   . Worried About Charity fundraiser in the Last Year: Not on file  . Ran Out of Food in the Last Year: Not on file  Transportation Needs:   . Lack of Transportation (Medical): Not on file  . Lack of Transportation (Non-Medical): Not on file  Physical Activity:   . Days of Exercise per Week: Not on file  . Minutes of Exercise per Session: Not on file  Stress:   . Feeling of Stress : Not on file  Social Connections:   . Frequency of Communication with Friends and Family: Not on file  . Frequency of Social Gatherings with Friends and Family: Not on file  . Attends Religious Services: Not on file  . Active Member of Clubs or Organizations: Not on file  .  Attends Archivist Meetings: Not on file  . Marital Status: Not on file   Past Surgical History:  Procedure Laterality Date  . ABDOMINAL HYSTERECTOMY    . BILATERAL SALPINGECTOMY Bilateral 10/10/2017   Procedure: BILATERAL SALPINGECTOMY;  Surgeon: Chancy Milroy, MD;  Location: [facility];  Service: Gynecology;  Laterality: Bilateral;  . BREAST BIOPSY     US guided core 2009  . BREAST EXCISIONAL BIOPSY     right 1982 left 1981  . BREAST SURGERY     bilateral cysts/benign  . VAGINAL HYSTERECTOMY N/A 10/10/2017   Procedure: HYSTERECTOMY VAGINAL;  Surgeon: Chancy Milroy, MD;  Location: [facility];  Service: Gynecology;  Laterality: N/A;   Past Surgical History:  Procedure Laterality Date  . ABDOMINAL HYSTERECTOMY    . BILATERAL SALPINGECTOMY Bilateral 10/10/2017   Procedure: BILATERAL SALPINGECTOMY;  Surgeon: Chancy Milroy, MD;  Location: [facility];  Service: Gynecology;  Laterality: Bilateral;  . BREAST BIOPSY     US guided core 2009  . BREAST EXCISIONAL BIOPSY     right 1982 left 1981  . BREAST SURGERY     bilateral cysts/benign  . VAGINAL HYSTERECTOMY N/A 10/10/2017   Procedure: HYSTERECTOMY VAGINAL;  Surgeon: Chancy Milroy, MD;  Location: [facility];  Service: Gynecology;  Laterality: N/A;   Past Medical History:  Diagnosis Date  . Anemia   . Asthma   . Depression    no meds  . Eczema   . Fibroid   . Headache(784.0)    otc prn med  . History of blood transfusion   . Hypertension   . Knee pain, bilateral    arthralgia knee pain bilateral  . Seasonal allergies   . SVD (spontaneous vaginal delivery)    x 3  . Vaginal polyp 05/2020   LMP 04/03/2017 (Approximate)   Opioid Risk Score:   Fall Risk Score:  `1  Depression screen PHQ 2/9  Depression screen [facility] 2/9 05/20/2020 04/07/2020 03/04/2020 02/16/2020 01/22/2020 09/29/2019 12/24/2018  Decreased Interest 0 1 1 1  0 1 0  Down, Depressed, Hopeless 1 1 1 1 1 1  0  PHQ - 2 Score 1 2 2 2 1 2  0  Altered sleeping - - - 0 - 1 -   Tired, decreased energy - - - 1 - 3 -  Change in appetite - - - 0 - 1 -  Feeling bad or failure about yourself  - - -  0 - 1 -  Trouble concentrating - - - 0 - 0 -  Moving slowly or fidgety/restless - - - 0 - 3 -  Suicidal thoughts - - - 0 - 0 -  PHQ-9 Score - - - 3 - 11 -  Difficult doing work/chores - - - - - - -  Some recent data might be hidden    Review of Systems  Constitutional: Negative.   HENT: Negative.   Eyes: Negative.   Respiratory: Negative.   Cardiovascular: Negative.   Gastrointestinal: Negative.   Endocrine: Negative.   Genitourinary: Negative.   Musculoskeletal: Positive for arthralgias and gait problem.  Skin: Negative.   Allergic/Immunologic: Negative.   Hematological: Negative.   Psychiatric/Behavioral: Negative.   All other systems reviewed and are negative.      Objective:   Physical Exam Gen: no distress, normal appearing HEENT: oral mucosa pink and moist, NCAT Cardio: Reg rate Chest: normal effort, normal rate of breathing Abd: soft, non-distended Ext: no edema Skin: intact Neuro: Alert and oriented x3. Musculoskeletal: 5/5 strength throughout upper and lower extremities. Sensation intact. No tenderness to palpation, but she has knee pain on sit-to-stand. Psych: pleasant, normal affect    Assessment & Plan:  Caroline Ryan is a 54 year old woman presenting to establish care for bilateral chronic knee pain, neck pain.  1) Bilateral knee OA: -XRs reviewed and consistent with OA -She received steroid injections q57months. -She had viscosupplementation injections in July with excellent benefits. She would like to repeat this in December (6 months) as effects have started to wear off.  -Vitamin D level normal.  -Prescribed compounding cream as follows: Diclofenac 3%, Gabapentin 5%, Lidocaine 5%, Menthol 1% compounded at [facility]:  apply 1-2 pumps three to four times per day as needed-- this was too expensive for her to purchase.  Recommended blue emu oil -Discussed her walking.Made goal to increase to 15 minutes at least 4 times per week.  -Discussed that every pound she loses is 6 lbs off her knees.  -Unable to  -Advised that it the Gabapentin may be making her sleepy. Educated that Gabapentin can help with her hot flashes.   2) Muscle cramps:magneisum level was normal last visit.  3) Morbid obesity: Weight is 257 lbs, which is same as last visit. Discussed intermittent fasting and low carb diet. She eats very healthy! Made goal to start eating at 11:15am instead of 11am and to move dinner time from 8:30pm to 8:15pm. HgbA1C 5.2- discussed that there is no prediabetes  4) General health -Reviewed to gynecology for pap smear -Reviewed lipids and were normal last year.   5) Radicular symptoms in upper and lower extremities: -no evidence of weakness on exam to suggest myelopathy -Cervical XR results reviewed with her: they show mild spondylosis at C5-6 and C6-7.  No acute fracture. Lumbar XR reviewed with patient and shows 1. No acute compression fracture. 2. A 7 mm anterolisthesis of L4 on L5, presumably degenerative in etiology. 3. Multilevel degenerative disc disease and facet arthrosis. -Patient worried about heart attack- advised that symptoms appear to be more radicular and are not accompanied by chest pain.   6) Palpitations:  -She has had occasional episodes -Denies chest pain. She gets SOB due to her asthma.   7) Iron deficiency anemia: Iron was very low in 2018- she has a history of menorrhagia and so she could continue to have anemia. Hemoglobin was within normal limits when checked in May. Discussed iron rich foods  as she prefers no supplement at this time.   40 minutes spent in discussion of her bilateral knee pain, hot flashes, her medications, reviewing cervical and lumbar XRs, fibroids, her weight, walking, her diet, pain contract, and urine sample, viscosupplementation

## 2020-06-23 NOTE — Patient Instructions (Signed)
Blue emu oil 

## 2020-07-04 ENCOUNTER — Other Ambulatory Visit: Payer: Self-pay | Admitting: Family Medicine

## 2020-07-04 DIAGNOSIS — J4520 Mild intermittent asthma, uncomplicated: Secondary | ICD-10-CM

## 2020-07-04 MED FILL — LISINOPRIL-HYDROCHLOROTHIAZ: 20-25 | 30 days supply | Qty: 30 | Fill #1

## 2020-07-04 MED FILL — IBUPROFEN 800 MG TABLET: 800 | 10 days supply | Qty: 30 | Fill #1

## 2020-07-13 ENCOUNTER — Encounter: Payer: Self-pay | Admitting: Orthopaedic Surgery

## 2020-07-13 ENCOUNTER — Ambulatory Visit (INDEPENDENT_AMBULATORY_CARE_PROVIDER_SITE_OTHER): Payer: Medicaid Other | Admitting: Orthopaedic Surgery

## 2020-07-13 VITALS — Ht 61.0 in | Wt 256.0 lb

## 2020-07-13 DIAGNOSIS — M1712 Unilateral primary osteoarthritis, left knee: Secondary | ICD-10-CM

## 2020-07-13 DIAGNOSIS — M1711 Unilateral primary osteoarthritis, right knee: Secondary | ICD-10-CM

## 2020-07-13 MED ORDER — LIDOCAINE HCL 1 % IJ SOLN
0.5000 mL | INTRAMUSCULAR | Status: AC | PRN
  Administered 2020-07-13: .5 mL

## 2020-07-13 MED ORDER — METHYLPREDNISOLONE ACETATE 40 MG/ML IJ SUSP
40.0000 mg | INTRAMUSCULAR | Status: AC | PRN
  Administered 2020-07-13: 40 mg via INTRA_ARTICULAR

## 2020-07-13 NOTE — Progress Notes (Signed)
Office Visit Note   Patient: Caroline Ryan [facility]           Date of Birth: 12-26-1965           MRN: [id number] Visit Date: 07/13/2020              Requested by: Vevelyn Francois, NP [address] [place],  [place] [address] PCP: [name], FNP   Assessment & Plan: Visit Diagnoses:  1. Primary osteoarthritis of right knee   2. Primary osteoarthritis of left knee     Plan: She will continue to try to work on weight loss.  We will see her back 3 months for weight check for possible repeat injections both knees.  Questions encouraged and answered  Follow-Up Instructions: No follow-ups on file.   Orders:  Orders Placed This Encounter  Procedures  . Large Joint Inj: bilateral knee   No orders of the defined types were placed in this encounter.     Procedures: Large Joint Inj: bilateral knee on 07/13/2020 11:23 AM Indications: pain Details: 22 G 1.5 in needle, anterolateral approach  Arthrogram: No  Medications (Right): 0.5 mL lidocaine 1 %; 40 mg methylPREDNISolone acetate 40 MG/ML Medications (Left): 0.5 mL lidocaine 1 %; 40 mg methylPREDNISolone acetate 40 MG/ML Outcome: tolerated well, no immediate complications Procedure, treatment alternatives, risks and benefits explained, specific risks discussed. Consent was given by the patient. Immediately prior to procedure a time out was called to verify the correct patient, procedure, equipment, support staff and site/side marked as required. Patient was prepped and draped in the usual sterile fashion.       Clinical Data: No additional findings.   Subjective: Chief Complaint  Patient presents with  . Left Knee - Pain, Follow-up  . Right Knee - Pain, Follow-up    HPI Patient comes in today follow-up bilateral knee she is wearing injections of both knees.  Also for weight check.  She has known osteoarthritis of both knees.  Left knee has been bothering her for more than right.  No known injury.  She has had  no recent vaccines.  Denies any fevers chills. Review of Systems See HPI  Objective: Vital Signs: Ht 5\' 1"  (1.549 m)   Wt 256 lb (116.1 kg)   LMP 04/03/2017 (Approximate)   BMI 48.37 kg/m   Physical Exam General: Well-developed well-nourished female no acute distress Ortho Exam Bilateral knees good range of motion no abnormal warmth erythema or effusion. Specialty Comments:  No specialty comments available.  Imaging: No results found.   PMFS History: Patient Active Problem List   Diagnosis Date Noted  . Unilateral primary osteoarthritis, left knee 10/22/2019  . Unilateral primary osteoarthritis, right knee 10/22/2019  . BMI 50.0-59.9, adult ([place]) 10/22/2019  . Grieving 06/10/2019  . Pruritus 06/10/2019  . Bilateral lower extremity edema 11/02/2018  . Post-operative state 10/10/2017  . Symptomatic anemia 05/20/2017  . Arthralgia of both knees 06/27/2015  . BACK STRAIN, LUMBAR 03/16/2010  . KNEE PAIN, BILATERAL 06/30/2009  . OBESITY 04/13/2009  . Edema 04/13/2009  . [place], BREAST 04/20/2008  . HYPERTENSION, BENIGN ESSENTIAL 04/14/2008  . Allergic rhinitis 04/14/2008  . Asthma 04/14/2008   Past Medical History:  Diagnosis Date  . Anemia   . Asthma   . Depression    no meds  . Eczema   . Fibroid   . Headache(784.0)    otc prn med  . History of blood transfusion   . Hypertension   .  Knee pain, bilateral    arthralgia knee pain bilateral  . Seasonal allergies   . SVD (spontaneous vaginal delivery)    x 3  . Vaginal polyp 05/2020    Family History  Problem Relation Age of Onset  . Hypertension Father   . Diabetes Brother   . Diabetes Brother   . Breast cancer Mother 24  . Other Neg Hx     Past Surgical History:  Procedure Laterality Date  . ABDOMINAL HYSTERECTOMY    . BILATERAL SALPINGECTOMY Bilateral 10/10/2017   Procedure: BILATERAL SALPINGECTOMY;  Surgeon: Chancy Milroy, MD;  Location: [facility];  Service: Gynecology;  Laterality: Bilateral;   . BREAST BIOPSY     US guided core 2009  . BREAST EXCISIONAL BIOPSY     right 1982 left 1981  . BREAST SURGERY     bilateral cysts/benign  . VAGINAL HYSTERECTOMY N/A 10/10/2017   Procedure: HYSTERECTOMY VAGINAL;  Surgeon: Chancy Milroy, MD;  Location: [facility];  Service: Gynecology;  Laterality: N/A;   Social History   Occupational History  . Not on file  Tobacco Use  . Smoking status: Former Smoker    Packs/day: 0.25    Years: 2.00    Pack years: 0.50    Types: Cigarettes    Quit date: 08/20/2014    Years since quitting: 5.9  . Smokeless tobacco: Never Used  Vaping Use  . Vaping Use: Never used  Substance and Sexual Activity  . Alcohol use: Yes    Alcohol/week: 2.0 standard drinks    Types: 2 Glasses of wine per week  . Drug use: No  . Sexual activity: Yes    Birth control/protection: Condom

## 2020-07-26 MED FILL — hydrOXYzine HCL 25 MG TABS: 25 | 10 days supply | Qty: 30 | Fill #1

## 2020-07-26 MED FILL — GABAPENTIN 300 MG CAPSULE: 300 | 60 days supply | Qty: 180 | Fill #3

## 2020-08-02 ENCOUNTER — Other Ambulatory Visit: Payer: Self-pay | Admitting: Family Medicine

## 2020-08-02 MED FILL — MONTELUKAST SOD 10 MG TAB: 10 | 90 days supply | Qty: 90 | Fill #0

## 2020-08-05 ENCOUNTER — Encounter: Payer: Self-pay | Admitting: Physical Medicine and Rehabilitation

## 2020-08-05 ENCOUNTER — Encounter
Payer: Medicaid Other | Attending: Physical Medicine and Rehabilitation | Admitting: Physical Medicine and Rehabilitation

## 2020-08-05 ENCOUNTER — Other Ambulatory Visit: Payer: Self-pay

## 2020-08-05 VITALS — BP 152/92 | HR 82 | Temp 98.3°F | Ht 61.0 in | Wt 253.2 lb

## 2020-08-05 DIAGNOSIS — I10 Essential (primary) hypertension: Secondary | ICD-10-CM | POA: Diagnosis present

## 2020-08-05 DIAGNOSIS — M170 Bilateral primary osteoarthritis of knee: Secondary | ICD-10-CM | POA: Diagnosis present

## 2020-08-05 MED ORDER — GABAPENTIN 600 MG PO TABS: 600.0000 mg | ORAL_TABLET | Freq: Three times a day (TID) | ORAL | 1 refills | Status: DC

## 2020-08-05 NOTE — Progress Notes (Signed)
Subjective:    Patient ID: Caroline Ryan, female    DOB: 05/10/1966, 54 y.o.   MRN: [id number]  HPI  Caroline Ryan presents for follow-up of bilateral knee pain.  Her pain has been well controlled, 8/10 today.   BP 152/92 in office today. It is usually 140/70. Diet has ben pretty good.   The cream was too expensive for her at [facility]. She has been trying to walk. Her mood is a little more down today. The handicap sticker allows her to go out more.  She has been taking ibuprofen 800mg  and Gabapentin. She has tried Tylenol in the past and it does not help much. She would like to repeat Monovisc next visit in December.   Weight is 253 lbs, down 2 lbs from last visit.   Prior history: She is feeling very fatigued today. I have bene unable to get her into see the OBGYN as they did not accept her insurance.   Prior history:  Caroline Ryan is a 54 year old woman who presents with bilateral knee OA. Last visit we did bilateral knee viscosupplementation injections that has helped her so much.   She was helping her dad move last night.   Her BP is well controlled.   She is taking the Norco about twice per day. This is the only medication that helps to ease her pain.   She hopes to be able to do shopping and spend time with her daughter.   She is 257 lbs, same weight is last time.   She is walking more than last month.   She wants to try a juice diet and help get her weight off. She has been eating fruits and vegetables.  She has been noting sharp pains shooting down her legs and arms. She felt weak when she had this pain. Her arm buckled when she had this pain.  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   Pain is 9/10 on average, and 7/10 right now.   She was diagnosed with fibroids and is stressed by the fact that she has not heard from the gynecologist yet.   She is also having hot flashes from menopause and asks what kind of treatments are available.   Pain  Inventory Average Pain 8 Pain Right Now 8 My pain is sharp, burning and stabbing  In the last 24 hours, has pain interfered with the following? General activity 8 Relation with others 7 Enjoyment of life 9 What TIME of day is your pain at its worst? daytime Sleep (in general) Fair  Pain is worse with: walking and sitting Pain improves with: rest, heat/ice, therapy/exercise, medication, TENS and injections Relief from Meds: 8  Family History  Problem Relation Age of Onset  . Hypertension Father   . Diabetes Brother   . Diabetes Brother   . Breast cancer Mother 54  . Other Neg Hx    Social History   Socioeconomic History  . Marital status: Single    Spouse name: Not on file  . Number of children: Not on file  . Years of education: Not on file  . Highest education level: Not on file  Occupational History  . Not on file  Tobacco Use  . Smoking status: Former Smoker    Packs/day: 0.25    Years: 2.00    Pack years: 0.50    Types: Cigarettes    Quit date: 08/20/2014    Years since quitting: 5.9  . Smokeless  tobacco: Never Used  Vaping Use  . Vaping Use: Never used  Substance and Sexual Activity  . Alcohol use: Yes    Alcohol/week: 2.0 standard drinks    Types: 2 Glasses of wine per week  . Drug use: No  . Sexual activity: Yes    Birth control/protection: Condom  Other Topics Concern  . Not on file  Social History Narrative  . Not on file   Social Determinants of Health   Financial Resource Strain: Not on file  Food Insecurity: Not on file  Transportation Needs: Not on file  Physical Activity: Not on file  Stress: Not on file  Social Connections: Not on file   Past Surgical History:  Procedure Laterality Date  . ABDOMINAL HYSTERECTOMY    . BILATERAL SALPINGECTOMY Bilateral 10/10/2017   Procedure: BILATERAL SALPINGECTOMY;  Surgeon: Chancy Milroy, MD;  Location: [facility];  Service: Gynecology;  Laterality: Bilateral;  . BREAST BIOPSY     US guided core 2009   . BREAST EXCISIONAL BIOPSY     right 1982 left 1981  . BREAST SURGERY     bilateral cysts/benign  . VAGINAL HYSTERECTOMY N/A 10/10/2017   Procedure: HYSTERECTOMY VAGINAL;  Surgeon: Chancy Milroy, MD;  Location: [facility];  Service: Gynecology;  Laterality: N/A;   Past Surgical History:  Procedure Laterality Date  . ABDOMINAL HYSTERECTOMY    . BILATERAL SALPINGECTOMY Bilateral 10/10/2017   Procedure: BILATERAL SALPINGECTOMY;  Surgeon: Chancy Milroy, MD;  Location: [facility];  Service: Gynecology;  Laterality: Bilateral;  . BREAST BIOPSY     US guided core 2009  . BREAST EXCISIONAL BIOPSY     right 1982 left 1981  . BREAST SURGERY     bilateral cysts/benign  . VAGINAL HYSTERECTOMY N/A 10/10/2017   Procedure: HYSTERECTOMY VAGINAL;  Surgeon: Chancy Milroy, MD;  Location: [facility];  Service: Gynecology;  Laterality: N/A;   Past Medical History:  Diagnosis Date  . Anemia   . Asthma   . Depression    no meds  . Eczema   . Fibroid   . Headache(784.0)    otc prn med  . History of blood transfusion   . Hypertension   . Knee pain, bilateral    arthralgia knee pain bilateral  . Seasonal allergies   . SVD (spontaneous vaginal delivery)    x 3  . Vaginal polyp 05/2020   BP (!) 152/92   Pulse 82   Temp 98.3 F (36.8 C)   Ht 5\' 1"  (1.549 m)   Wt 253 lb 3.2 oz (114.9 kg)   LMP 04/03/2017 (Approximate)   SpO2 96%   BMI 47.84 kg/m   Opioid Risk Score:   Fall Risk Score:  `1  Depression screen PHQ 2/9  Depression screen [facility] 2/9 05/20/2020 04/07/2020 03/04/2020 02/16/2020 01/22/2020 09/29/2019 12/24/2018  Decreased Interest 0 1 1 1  0 1 0  Down, Depressed, Hopeless 1 1 1 1 1 1  0  PHQ - 2 Score 1 2 2 2 1 2  0  Altered sleeping - - - 0 - 1 -  Tired, decreased energy - - - 1 - 3 -  Change in appetite - - - 0 - 1 -  Feeling bad or failure about yourself  - - - 0 - 1 -  Trouble concentrating - - - 0 - 0 -  Moving slowly or fidgety/restless - - - 0 - 3 -  Suicidal thoughts - - - 0 - 0 -   PHQ-9  Score - - - 3 - 11 -  Difficult doing work/chores - - - - - - -  Some recent data might be hidden    Review of Systems  Constitutional: Negative.   HENT: Negative.   Eyes: Negative.   Respiratory: Negative.   Cardiovascular: Negative.   Gastrointestinal: Negative.   Endocrine: Negative.   Genitourinary: Negative.   Musculoskeletal: Positive for arthralgias and gait problem.  Skin: Negative.   Allergic/Immunologic: Negative.   Hematological: Negative.   Psychiatric/Behavioral: Negative.   All other systems reviewed and are negative.      Objective:   Physical Exam Gen: no distress, normal appearing HEENT: oral mucosa pink and moist, NCAT Cardio: Reg rate Chest: normal effort, normal rate of breathing Abd: soft, non-distended Ext: no edema Skin: intact Neuro: Musculoskeletal: Psych: pleasant, normal affect Neuro: Alert and oriented x3. Musculoskeletal: 5/5 strength throughout upper and lower extremities. Sensation intact. No tenderness to palpation, but she has knee pain on sit-to-stand. Psych: pleasant, normal affect    Assessment & Plan:  Caroline Ryan is a 54 year old woman presenting to establish care for bilateral chronic knee pain, neck pain.  1) Bilateral knee OA: -XRs reviewed and consistent with OA -She received steroid injections q66months. -She had viscosupplementation injections in July with excellent benefits. Repeat viscosupplementation injection next week.  -Vitamin D level normal.  -Prescribed compounding cream as follows: Diclofenac 3%, Gabapentin 5%, Lidocaine 5%, Menthol 1% compounded at [facility]:  apply 1-2 pumps three to four times per day as needed-- this was too expensive for her to purchase. Recommended blue emu oil -Discussed her walking.Made goal to increase to 15 minutes at least 4 times per week.  -Discussed that every pound she loses is 6 lbs off her knees.  -Unable to  -Increase Gabapentin to 600mg  TID. Start at night and  don't drive initially after starting higher dose as could make her very sleepy. Educated that Gabapentin can help with her hot flashes.   2) Muscle cramps:magneisum level was normal last visit.  3) Morbid obesity: Weight is 255 lbs, which is 2 lbs less than last visit. Discussed intermittent fasting and low carb diet. Continue healthy diet. Made goal to start eating at 11:15am instead of 11am and to move dinner time from 8:30pm to 8:15pm. HgbA1C 5.2- discussed that there is no prediabetes  4) General health -Reviewed to gynecology for pap smear -Reviewed lipids and were normal last year.   5) Radicular symptoms in upper and lower extremities: -no evidence of weakness on exam to suggest myelopathy -Cervical XR results reviewed with her: they show mild spondylosis at C5-6 and C6-7.  No acute fracture. Lumbar XR reviewed with patient and shows 1. No acute compression fracture. 2. A 7 mm anterolisthesis of L4 on L5, presumably degenerative in etiology. 3. Multilevel degenerative disc disease and facet arthrosis. -Patient worried about heart attack- advised that symptoms appear to be more radicular and are not accompanied by chest pain.   6) Palpitations:  -She has had occasional episodes -Denies chest pain. She gets SOB due to her asthma.   7) Iron deficiency anemia: Iron was very low in 2018- she has a history of menorrhagia and so she could continue to have anemia. Hemoglobin was within normal limits when checked in May. Discussed iron rich foods as she prefers no supplement at this time.

## 2020-08-05 NOTE — Patient Instructions (Signed)
Blue emu oil 

## 2020-08-09 ENCOUNTER — Ambulatory Visit (INDEPENDENT_AMBULATORY_CARE_PROVIDER_SITE_OTHER): Payer: Medicaid Other | Admitting: Family Medicine

## 2020-08-09 ENCOUNTER — Other Ambulatory Visit: Payer: Self-pay

## 2020-08-09 ENCOUNTER — Encounter: Payer: Self-pay | Admitting: Family Medicine

## 2020-08-09 VITALS — BP 137/84 | HR 88 | Wt 248.4 lb

## 2020-08-09 DIAGNOSIS — Q529 Congenital malformation of female genitalia, unspecified: Secondary | ICD-10-CM

## 2020-08-09 NOTE — Assessment & Plan Note (Signed)
Benign enlargement of hymenal ring, appears to have some mild vascular engorgement. No concerning findings, patient reassured.

## 2020-08-09 NOTE — Patient Instructions (Signed)
If you are in need of transportation to get to and from your appointments in our office.  You can reach Transportation Services by calling [phone] Monday - Friday  7am-6pm.

## 2020-08-09 NOTE — Progress Notes (Signed)
GYNECOLOGY OFFICE VISIT NOTE  History:   Caroline Ryan is a 54 y.o. [phone] here today for evaluation of vaginal abnormality.  Patient hx notable for TVH/BLS  Seen by PCP office several months ago, c/f vaginal polyp so referred here No other concerns Has not had any symptoms of discharge or bleeding from the vagina Some pressure but no other symptoms  Past Medical History:  Diagnosis Date  . Anemia   . Asthma   . Depression    no meds  . Eczema   . Fibroid   . Headache(784.0)    otc prn med  . History of blood transfusion   . Hypertension   . Knee pain, bilateral    arthralgia knee pain bilateral  . Seasonal allergies   . SVD (spontaneous vaginal delivery)    x 3  . Vaginal polyp 05/2020    Past Surgical History:  Procedure Laterality Date  . ABDOMINAL HYSTERECTOMY    . BILATERAL SALPINGECTOMY Bilateral 10/10/2017   Procedure: BILATERAL SALPINGECTOMY;  Surgeon: Chancy Milroy, MD;  Location: [facility];  Service: Gynecology;  Laterality: Bilateral;  . BREAST BIOPSY     US guided core 2009  . BREAST EXCISIONAL BIOPSY     right 1982 left 1981  . BREAST SURGERY     bilateral cysts/benign  . VAGINAL HYSTERECTOMY N/A 10/10/2017   Procedure: HYSTERECTOMY VAGINAL;  Surgeon: Chancy Milroy, MD;  Location: [facility];  Service: Gynecology;  Laterality: N/A;    The following portions of the patient's history were reviewed and updated as appropriate: allergies, current medications, past family history, past medical history, past social history, past surgical history and problem list.   Health Maintenance:  Normal pap and negative HRHPV: s/p TVH.  Normal mammogram: 03/21/2020.   Review of Systems:  Pertinent items noted in HPI and remainder of comprehensive ROS otherwise negative.  Physical Exam:  BP 137/84   Pulse 88   Wt 248 lb 6.4 oz (112.7 kg)   LMP 04/03/2017 (Approximate)   BMI 46.93 kg/m  CONSTITUTIONAL: Well-developed, well-nourished female in no acute  distress.  HEENT:  Normocephalic, atraumatic. External right and left ear normal. No scleral icterus.  NECK: Normal range of motion, supple, no masses noted on observation SKIN: No rash noted. Not diaphoretic. No erythema. No pallor. MUSCULOSKELETAL: Normal range of motion. No edema noted. NEUROLOGIC: Alert and oriented to person, place, and time. Normal muscle tone coordination.  PSYCHIATRIC: Normal mood and affect. Normal behavior. Normal judgment and thought content. RESPIRATORY: Effort normal, no problems with respiration noted ABDOMEN: No masses noted. No other overt distention noted.   PELVIC: Unremarkable external genitalia. Mildly enlarged hymenal tissue at 11 o clock, vaginal cuff unremarkable, vaginal mucosa unremarkable. See attached image    Labs and Imaging No results found for this or any previous visit (from the past 168 hour(s)). No results found.    Assessment and Plan:   Problem List Items Addressed This Visit      Genitourinary   Abnormality of hymen - Primary    Benign enlargement of hymenal ring, appears to have some mild vascular engorgement. No concerning findings, patient reassured.          Routine preventative health maintenance measures emphasized. Please refer to After Visit Summary for other counseling recommendations.   Return if symptoms worsen or fail to improve.    Total face-to-face time with patient: 20 minutes.  Over 50% of encounter was spent on counseling and coordination of care.  Clarnce Flock, MD/[facility] [employer], [place]

## 2020-08-10 ENCOUNTER — Encounter: Payer: Medicaid Other | Admitting: Physical Medicine and Rehabilitation

## 2020-08-10 ENCOUNTER — Encounter: Payer: Self-pay | Admitting: Physical Medicine and Rehabilitation

## 2020-08-10 ENCOUNTER — Other Ambulatory Visit: Payer: Self-pay | Admitting: Physical Medicine and Rehabilitation

## 2020-08-10 VITALS — BP 157/92 | HR 90 | Temp 98.5°F | Ht 61.0 in | Wt 251.0 lb

## 2020-08-10 DIAGNOSIS — I10 Essential (primary) hypertension: Secondary | ICD-10-CM | POA: Diagnosis not present

## 2020-08-10 DIAGNOSIS — M170 Bilateral primary osteoarthritis of knee: Secondary | ICD-10-CM

## 2020-08-10 MED ORDER — DICLOFENAC SODIUM 75 MG PO TBEC: 75.0000 mg | DELAYED_RELEASE_TABLET | Freq: Two times a day (BID) | ORAL | 1 refills | Status: DC | PRN

## 2020-08-10 MED ORDER — LISINOPRIL-HYDROCHLOROTHIAZIDE 20-25 MG PO TABS: 1.0000 | ORAL_TABLET | Freq: Every day | ORAL | 11 refills | Status: DC

## 2020-08-10 NOTE — Progress Notes (Signed)
Knee injection, bilateral  Indication: Knee pain not relieved by medication management and other conservative care.  Informed consent was obtained after describing risks and benefits of the procedure with the patient, this includes bleeding, bruising, infection and medication side effects. The patient wishes to proceed and has given written consent. The patient was placed in a seated position. The lateral aspect of the knee was marked and prepped with Betadine and alcohol. After negative draw back for blood, a solution containing Monovisc was injected with a 25 gauge 1.5 inch needle. The patient tolerated the procedure well. Post procedure instructions were given.

## 2020-08-17 MED FILL — MONTELUKAST SOD 10 MG TAB: 10 | 90 days supply | Qty: 90 | Fill #0

## 2020-08-17 MED FILL — GABAPENTIN 300 MG CAPSULE: 300 | 60 days supply | Qty: 180 | Fill #3

## 2020-08-17 MED FILL — hydrOXYzine HCL 25 MG TABS: 25 | 10 days supply | Qty: 30 | Fill #1

## 2020-08-17 MED FILL — LISINOPRIL-HYDROCHLOROTHIAZ: 20-25 | 30 days supply | Qty: 30 | Fill #2

## 2020-08-23 ENCOUNTER — Other Ambulatory Visit: Payer: Self-pay | Admitting: Family Medicine

## 2020-08-23 DIAGNOSIS — J4520 Mild intermittent asthma, uncomplicated: Secondary | ICD-10-CM

## 2020-08-24 ENCOUNTER — Other Ambulatory Visit: Payer: Self-pay | Admitting: Family Medicine

## 2020-08-25 MED FILL — PROAIR HFA 90 MCG INHALER: 108 (90 BAS | 25 days supply | Qty: 9 | Fill #0

## 2020-09-22 ENCOUNTER — Encounter
Payer: Medicaid Other | Attending: Physical Medicine and Rehabilitation | Admitting: Physical Medicine and Rehabilitation

## 2020-09-22 DIAGNOSIS — I10 Essential (primary) hypertension: Secondary | ICD-10-CM | POA: Insufficient documentation

## 2020-09-22 DIAGNOSIS — M170 Bilateral primary osteoarthritis of knee: Secondary | ICD-10-CM | POA: Insufficient documentation

## 2020-09-28 MED FILL — PROAIR HFA 90 MCG INHALER: 108 (90 BAS | 25 days supply | Qty: 9 | Fill #0

## 2020-09-28 MED FILL — LISINOPRIL-HYDROCHLOROTHIAZ: 20-25 | 30 days supply | Qty: 30 | Fill #0

## 2020-09-28 MED FILL — DICLOFENAC SOD EC 75 MG TAB: 75 | 30 days supply | Qty: 60 | Fill #0

## 2020-11-09 MED FILL — IBUPROFEN 800 MG TABLET: 800 | 10 days supply | Qty: 30 | Fill #2

## 2020-11-09 MED FILL — hydrOXYzine HCL 25 MG TABS: 25 | 10 days supply | Qty: 30 | Fill #2

## 2020-11-09 MED FILL — MONTELUKAST SOD 10 MG TAB: 10 | 90 days supply | Qty: 90 | Fill #1

## 2020-11-09 MED FILL — LISINOPRIL-HYDROCHLOROTHIAZ: 20-25 | 30 days supply | Qty: 30 | Fill #1

## 2020-11-09 MED FILL — PROAIR HFA 90 MCG INHALER: 108 (90 BAS | 25 days supply | Qty: 9 | Fill #1

## 2020-11-15 ENCOUNTER — Ambulatory Visit: Payer: Medicaid Other | Admitting: Family Medicine

## 2020-11-18 ENCOUNTER — Ambulatory Visit: Payer: Medicaid Other | Admitting: Family Medicine

## 2020-12-05 ENCOUNTER — Other Ambulatory Visit: Payer: Self-pay

## 2020-12-05 ENCOUNTER — Other Ambulatory Visit: Payer: Self-pay | Admitting: Family Medicine

## 2020-12-14 ENCOUNTER — Other Ambulatory Visit: Payer: Self-pay

## 2020-12-23 ENCOUNTER — Other Ambulatory Visit: Payer: Self-pay

## 2020-12-23 ENCOUNTER — Other Ambulatory Visit: Payer: Self-pay | Admitting: Nurse Practitioner

## 2020-12-23 MED FILL — Ibuprofen Tab 800 MG: ORAL | 10 days supply | Qty: 30 | Fill #0 | Status: AC

## 2020-12-23 MED FILL — Albuterol Sulfate Inhal Aero 108 MCG/ACT (90MCG Base Equiv): RESPIRATORY_TRACT | 25 days supply | Qty: 8.5 | Fill #0 | Status: AC

## 2020-12-23 MED FILL — Diclofenac Sodium Gel 1% (1.16% Diethylamine Equiv): CUTANEOUS | 6 days supply | Qty: 100 | Fill #0 | Status: AC

## 2020-12-23 MED FILL — Hydroxyzine HCl Tab 25 MG: ORAL | 10 days supply | Qty: 30 | Fill #0 | Status: AC

## 2020-12-23 MED FILL — Lisinopril & Hydrochlorothiazide Tab 20-25 MG: ORAL | 30 days supply | Qty: 30 | Fill #0 | Status: AC

## 2020-12-24 MED ORDER — GABAPENTIN 300 MG PO CAPS
ORAL_CAPSULE | Freq: Three times a day (TID) | ORAL | 6 refills | Status: DC
  Filled 2020-12-24: qty 90, 30d supply, fill #0
  Filled 2021-04-06 – 2021-05-30 (×2): qty 90, 30d supply, fill #1
  Filled 2021-06-29 – 2021-07-10 (×2): qty 90, 30d supply, fill #2

## 2020-12-26 ENCOUNTER — Other Ambulatory Visit: Payer: Self-pay

## 2021-01-25 ENCOUNTER — Telehealth: Payer: Self-pay | Admitting: *Deleted

## 2021-01-25 NOTE — Telephone Encounter (Signed)
Caroline Ryan called and says that she needs Dr Ranell Patrick to call her because she is having severe knee pain. She reports that she was taking care of her father and she slipped and has been having pain ever since.  I have advised her that Dr Ranell Patrick is not in the office today but should be back tomorrow. Caroline Ryan said she would just like to speak with Dr Ranell Patrick when it is convenient. Her number is [phone].

## 2021-01-26 ENCOUNTER — Other Ambulatory Visit: Payer: Self-pay

## 2021-01-26 ENCOUNTER — Encounter: Payer: Medicaid Other | Admitting: Physical Medicine and Rehabilitation

## 2021-01-27 ENCOUNTER — Other Ambulatory Visit: Payer: Self-pay

## 2021-01-27 MED FILL — Lisinopril & Hydrochlorothiazide Tab 20-25 MG: ORAL | 30 days supply | Qty: 30 | Fill #1 | Status: CN

## 2021-01-27 MED FILL — Ibuprofen Tab 800 MG: ORAL | 10 days supply | Qty: 30 | Fill #1 | Status: CN

## 2021-01-30 ENCOUNTER — Other Ambulatory Visit: Payer: Self-pay

## 2021-02-03 ENCOUNTER — Other Ambulatory Visit: Payer: Self-pay

## 2021-02-06 ENCOUNTER — Other Ambulatory Visit: Payer: Self-pay

## 2021-02-06 ENCOUNTER — Encounter
Payer: Medicaid Other | Attending: Physical Medicine and Rehabilitation | Admitting: Physical Medicine and Rehabilitation

## 2021-02-06 VITALS — BP 149/83 | HR 86 | Temp 98.7°F | Ht 61.0 in | Wt 252.4 lb

## 2021-02-06 DIAGNOSIS — M170 Bilateral primary osteoarthritis of knee: Secondary | ICD-10-CM | POA: Diagnosis not present

## 2021-02-06 NOTE — Progress Notes (Signed)
Knee injection, bilateral  Indication:Bilateral Knee pain not relieved by medication management and other conservative care.  Informed consent was obtained after describing risks and benefits of the procedure with the patient, this includes bleeding, bruising, infection and medication side effects. The patient wishes to proceed and has given written consent. The patient was placed in a recumbent position. The medial aspect of bilateral knees were marked and prepped with Betadine and alcohol. They were then entered with 25-gauge 1-1/2 inch needles and Monovisc was inserted into the bilateral knee joints after negative draw back for blood. The patient tolerated the procedure well. Post procedure instructions were given.

## 2021-02-07 ENCOUNTER — Other Ambulatory Visit: Payer: Self-pay

## 2021-02-08 ENCOUNTER — Other Ambulatory Visit: Payer: Self-pay

## 2021-02-09 ENCOUNTER — Other Ambulatory Visit: Payer: Self-pay

## 2021-02-09 MED FILL — Lisinopril & Hydrochlorothiazide Tab 20-25 MG: ORAL | 30 days supply | Qty: 30 | Fill #1 | Status: AC

## 2021-02-09 MED FILL — Ibuprofen Tab 800 MG: ORAL | 10 days supply | Qty: 30 | Fill #1 | Status: AC

## 2021-02-15 ENCOUNTER — Other Ambulatory Visit: Payer: Self-pay

## 2021-02-15 MED FILL — Hydroxyzine HCl Tab 25 MG: ORAL | 10 days supply | Qty: 30 | Fill #1 | Status: AC

## 2021-02-15 MED FILL — Diclofenac Sodium Gel 1% (1.16% Diethylamine Equiv): CUTANEOUS | 6 days supply | Qty: 100 | Fill #1 | Status: AC

## 2021-02-17 ENCOUNTER — Other Ambulatory Visit: Payer: Self-pay

## 2021-02-22 ENCOUNTER — Other Ambulatory Visit: Payer: Self-pay

## 2021-02-22 DIAGNOSIS — Z1231 Encounter for screening mammogram for malignant neoplasm of breast: Secondary | ICD-10-CM

## 2021-03-21 ENCOUNTER — Other Ambulatory Visit: Payer: Self-pay | Admitting: Physical Medicine and Rehabilitation

## 2021-03-21 DIAGNOSIS — R600 Localized edema: Secondary | ICD-10-CM

## 2021-03-21 MED ORDER — FUROSEMIDE 20 MG PO TABS
20.0000 mg | ORAL_TABLET | Freq: Every day | ORAL | 6 refills | Status: AC
  Filled 2021-03-23: qty 90, 90d supply, fill #0

## 2021-03-23 ENCOUNTER — Other Ambulatory Visit: Payer: Self-pay

## 2021-03-23 DIAGNOSIS — R600 Localized edema: Secondary | ICD-10-CM | POA: Diagnosis not present

## 2021-03-23 MED FILL — Lisinopril & Hydrochlorothiazide Tab 20-25 MG: ORAL | 30 days supply | Qty: 30 | Fill #2 | Status: AC

## 2021-03-24 LAB — BASIC METABOLIC PANEL
BUN/Creatinine Ratio: 23 (ref 9–23)
BUN: 18 mg/dL (ref 6–24)
CO2: 24 mmol/L (ref 20–29)
Calcium: 9.9 mg/dL (ref 8.7–10.2)
Chloride: 103 mmol/L (ref 96–106)
Creatinine, Ser: 0.79 mg/dL (ref 0.57–1.00)
Glucose: 85 mg/dL (ref 65–99)
Potassium: 4.2 mmol/L (ref 3.5–5.2)
Sodium: 141 mmol/L (ref 134–144)
eGFR: 87 mL/min/{1.73_m2} (ref >59)

## 2021-03-24 LAB — BRAIN NATRIURETIC PEPTIDE: BNP: 41 pg/mL (ref 0.0–100.0)

## 2021-03-27 ENCOUNTER — Other Ambulatory Visit: Payer: Self-pay

## 2021-03-27 ENCOUNTER — Encounter
Payer: Medicaid Other | Attending: Physical Medicine and Rehabilitation | Admitting: Physical Medicine and Rehabilitation

## 2021-03-27 DIAGNOSIS — M170 Bilateral primary osteoarthritis of knee: Secondary | ICD-10-CM

## 2021-03-27 DIAGNOSIS — Z6841 Body Mass Index (BMI) 40.0 and over, adult: Secondary | ICD-10-CM

## 2021-03-27 DIAGNOSIS — Z1211 Encounter for screening for malignant neoplasm of colon: Secondary | ICD-10-CM

## 2021-03-27 NOTE — Progress Notes (Signed)
Subjective:    Patient ID: Caroline Ryan, female    DOB: 1965-11-17, 55 y.o.   MRN: [id number]  HPI  An audio/video tele-health visit is felt to be the most appropriate encounter for this patient at this time. This is a follow up tele-visit via phone. The patient is at home. MD is at office.    1) Bilateral knee OA: The cream was too expensive for her at [employer]. She has been trying to walk. Her mood is a little more down. The handicap sticker allows her to go out more. Current pain is 8/10. She is about to walk 15-20 minutes three times per week. She would like to repeat Monovisc next visit in December.  -she would like to get MRIs of her knees done to see if there is something more than arthritis   2) Obesity: Weight has gone down 5 lbs, 252 lbs today -she has been working on this.   3) Fatigue: She is feeling very fatigued. I have bene unable to get her into see the OBGYN as they did not accept her insurance.   4) Bilateral lower extremity edema: -the fluid keeps coming back even when she takes the Lasix up to 3 times per day -I gave her a referral to cardiology and she heard from them last week, but their next opening is in October -she sometimes feels short of breath when sleeping at night  5) Screening -she has never been screened for colon cancer and asks whether she should be screened for this.   Prior history:  Caroline Ryan is a 55 year old woman who presents with bilateral knee OA. Last visit we did bilateral knee viscosupplementation injections that has helped her so much.   She was helping her dad move last night.   Her BP is well controlled.   She is taking the Norco about twice per day. This is the only medication that helps to ease her pain.   She hopes to be able to do shopping and spend time with her daughter.   She is 257 lbs, same weight is last time.   She is walking more than last month.   She wants to try a juice diet and help get her  weight off. She has been eating fruits and vegetables.  She has been noting sharp pains shooting down her legs and arms. She felt weak when she had this pain. Her arm buckled when she had this pain.  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   Pain is 9/10 on average, and 7/10 right now.   She was diagnosed with fibroids and is stressed by the fact that she has not heard from the gynecologist yet.   She is also having hot flashes from menopause and asks what kind of treatments are available.   Pain Inventory Average Pain 9 Pain Right Now 10 My pain is constant, sharp and burning  In the last 24 hours, has pain interfered with the following? General activity 8 Relation with others 9 Enjoyment of life 9 What TIME of day is your pain at its worst? night Sleep (in general) Fair  Pain is worse with: walking, sitting and some activites Pain improves with: therapy/exercise, medication, TENS and injections Relief from Meds: 10  Family History  Problem Relation Age of Onset   Hypertension Father    Diabetes Brother    Diabetes Brother    Breast cancer Mother 66   Other Neg Hx  Social History   Socioeconomic History   Marital status: Single    Spouse name: Not on file   Number of children: Not on file   Years of education: Not on file   Highest education level: Not on file  Occupational History   Not on file  Tobacco Use   Smoking status: Former    Packs/day: 0.25    Years: 2.00    Pack years: 0.50    Types: Cigarettes    Quit date: 08/20/2014    Years since quitting: 6.6   Smokeless tobacco: Never  Vaping Use   Vaping Use: Never used  Substance and Sexual Activity   Alcohol use: Yes    Alcohol/week: 2.0 standard drinks    Types: 2 Glasses of wine per week   Drug use: No   Sexual activity: Yes    Birth control/protection: Condom  Other Topics Concern   Not on file  Social History Narrative   Not on file   Social Determinants of Health   Financial  Resource Strain: Not on file  Food Insecurity: Food Insecurity Present   Worried About [place] in the Last Year: Sometimes true   Ran Out of Food in the Last Year: Sometimes true  Transportation Needs: Unmet Transportation Needs   Lack of Transportation (Medical): Yes   Lack of Transportation (Non-Medical): Yes  Physical Activity: Not on file  Stress: Not on file  Social Connections: Not on file   Past Surgical History:  Procedure Laterality Date   ABDOMINAL HYSTERECTOMY     BILATERAL SALPINGECTOMY Bilateral 10/10/2017   Procedure: BILATERAL SALPINGECTOMY;  Surgeon: Chancy Milroy, MD;  Location: [facility];  Service: Gynecology;  Laterality: Bilateral;   BREAST BIOPSY     US guided core 2009   BREAST EXCISIONAL BIOPSY     right 1982 left [place]     bilateral cysts/benign   VAGINAL HYSTERECTOMY N/A 10/10/2017   Procedure: HYSTERECTOMY VAGINAL;  Surgeon: Chancy Milroy, MD;  Location: [facility];  Service: Gynecology;  Laterality: N/A;   Past Surgical History:  Procedure Laterality Date   ABDOMINAL HYSTERECTOMY     BILATERAL SALPINGECTOMY Bilateral 10/10/2017   Procedure: BILATERAL SALPINGECTOMY;  Surgeon: Chancy Milroy, MD;  Location: [facility];  Service: Gynecology;  Laterality: Bilateral;   BREAST BIOPSY     US guided core 2009   BREAST EXCISIONAL BIOPSY     right 1982 left [place]     bilateral cysts/benign   VAGINAL HYSTERECTOMY N/A 10/10/2017   Procedure: HYSTERECTOMY VAGINAL;  Surgeon: Chancy Milroy, MD;  Location: [facility];  Service: Gynecology;  Laterality: N/A;   Past Medical History:  Diagnosis Date   Anemia    Asthma    Depression    no meds   Eczema    Fibroid    Headache(784.0)    otc prn med   History of blood transfusion    Hypertension    Knee pain, bilateral    arthralgia knee pain bilateral   Seasonal allergies    SVD (spontaneous vaginal delivery)    x 3   Vaginal polyp 05/2020   LMP 04/03/2017  (Approximate)   Opioid Risk Score:   Fall Risk Score:  `1  Depression screen PHQ 2/9  Depression screen [facility] 2/9 02/06/2021 08/09/2020 05/20/2020 04/07/2020 03/04/2020 02/16/2020 01/22/2020  Decreased Interest 1 2 0 '1 1 1 '$ 0  Down, Depressed, Hopeless '1 2 1 1 1 '$ 1  1  PHQ - 2 Score '2 4 1 2 2 2 1  '$ Altered sleeping - 2 - - - 0 -  Tired, decreased energy - 1 - - - 1 -  Change in appetite - 1 - - - 0 -  Feeling bad or failure about yourself  - 1 - - - 0 -  Trouble concentrating - 0 - - - 0 -  Moving slowly or fidgety/restless - 0 - - - 0 -  Suicidal thoughts - 0 - - - 0 -  PHQ-9 Score - 9 - - - 3 -  Difficult doing work/chores - - - - - - -  Some recent data might be hidden    Review of Systems     Objective:   Physical Exam Not performed as patient was seen via phone    Assessment & Plan:  Caroline Ryan is a 55 year old woman presenting to establish care for bilateral chronic knee pain, neck pain.   1) Bilateral knee OA: -XRs reviewed and consistent with OA -She received steroid injections q41month. -She had viscosupplementation injections in July with excellent benefits. She would like to repeat this in December (6 months) as effects have started to wear off.  -Vitamin D level normal.  -Prescribed compounding cream as follows: Diclofenac 3%, Gabapentin 5%, Lidocaine 5%, [facility]ol 1[facility]  apply 1-2 pumps three to four times per day as needed-- this was too expensive for her to purchase. Recommended blue emu oil -Discussed her walking.Made goal to increase to 15 minutes at least 4 times per week.  -Discussed that every pound she loses is 6 lbs off her knees.  -Advised that it the Gabapentin may be making her sleepy. Educated that Gabapentin can help with her hot flashes.  -Bilateral MRI ordered to assess for meniscal/ligament tears   2) Muscle cramps:magneisum level was normal last visit.   3) Morbid obesity: Weight is 252 lbs, which is down 5 lbs from last  visit. Discussed intermittent fasting and low carb diet. She eats very healthy! Made goal to start eating at 11:15am instead of 11am and to move dinner time from 8:30pm to 8:15pm. HgbA1C 5.2- discussed that there is no prediabetes -discussed benefits of intermittent fasting  4) General health -Reviewed to gynecology for pap smear -Reviewed lipids and were normal last year.  -Referred for colonoscopy.   5) Radicular symptoms in upper and lower extremities: -no evidence of weakness on exam to suggest myelopathy -Cervical XR results reviewed with her: they show mild spondylosis at C5-6 and C6-7.  No acute fracture. Lumbar XR reviewed with patient and shows 1. No acute compression fracture. 2. A 7 mm anterolisthesis of L4 on L5, presumably degenerative in etiology. 3. Multilevel degenerative disc disease and facet arthrosis. -Patient worried about heart attack- advised that symptoms appear to be more radicular and are not accompanied by chest pain.   6) Palpitations:  -She has had occasional episodes -Denies chest pain. She gets SOB due to her asthma.   7) Iron deficiency anemia: Iron was very low in 2018- she has a history of menorrhagia and so she could continue to have anemia. Hemoglobin was within normal limits when checked in May. Discussed iron rich foods as she prefers no supplement at this time.   8) Bilateral lower extremity edema -continue lasix '20mg'$  daily -discussed that Cr and BMP were normal -f/u with cardiology in October -call me if symptoms worsen  7 minutes spent in discussing  patient's lab results (Cr and BMP), recommending to continue Lasix given continued swelling and orthopnea, f/u with cardiology in October, call me if symptoms worsen, referred for routine colonoscopy, discussed weight loss and benefits of intermittent fasting

## 2021-04-06 ENCOUNTER — Other Ambulatory Visit: Payer: Self-pay

## 2021-04-13 ENCOUNTER — Other Ambulatory Visit: Payer: Self-pay

## 2021-04-18 ENCOUNTER — Ambulatory Visit
Admission: RE | Admit: 2021-04-18 | Discharge: 2021-04-18 | Disposition: A | Discharge status: Home / Self Care | Payer: Medicaid Other | Source: Ambulatory Visit | Attending: *Deleted | Admitting: *Deleted

## 2021-04-18 ENCOUNTER — Other Ambulatory Visit: Payer: Self-pay

## 2021-04-18 DIAGNOSIS — Z1231 Encounter for screening mammogram for malignant neoplasm of breast: Secondary | ICD-10-CM

## 2021-04-18 IMAGING — MG MM DIGITAL SCREENING BILAT W/ TOMO AND CAD
8 of 14 series · 8 of 40 positions shown · non-contrast
Comparison: Previous exam(s).

CLINICAL DATA: Screening.

EXAM:
DIGITAL SCREENING BILATERAL MAMMOGRAM WITH TOMOSYNTHESIS AND CAD
TECHNIQUE: Bilateral screening digital craniocaudal and mediolateral oblique
mammograms were obtained. Bilateral screening digital breast
tomosynthesis was performed. The images were evaluated with
computer-aided detection.

[L MLO synth-2D (1 of 2)]
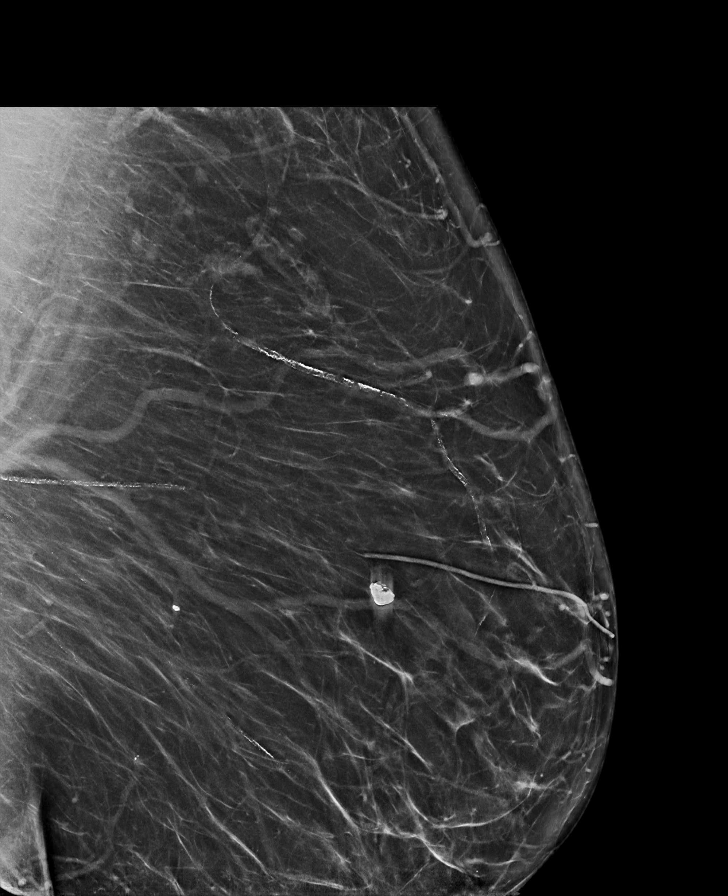

[L MLO synth-2D (2 of 2)]
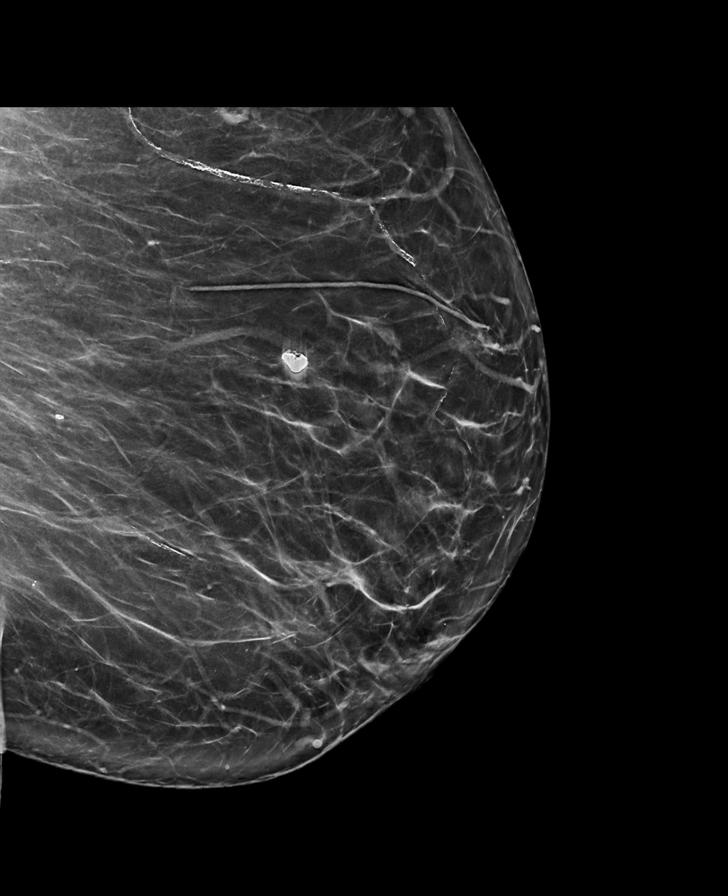

[L CC synth-2D]
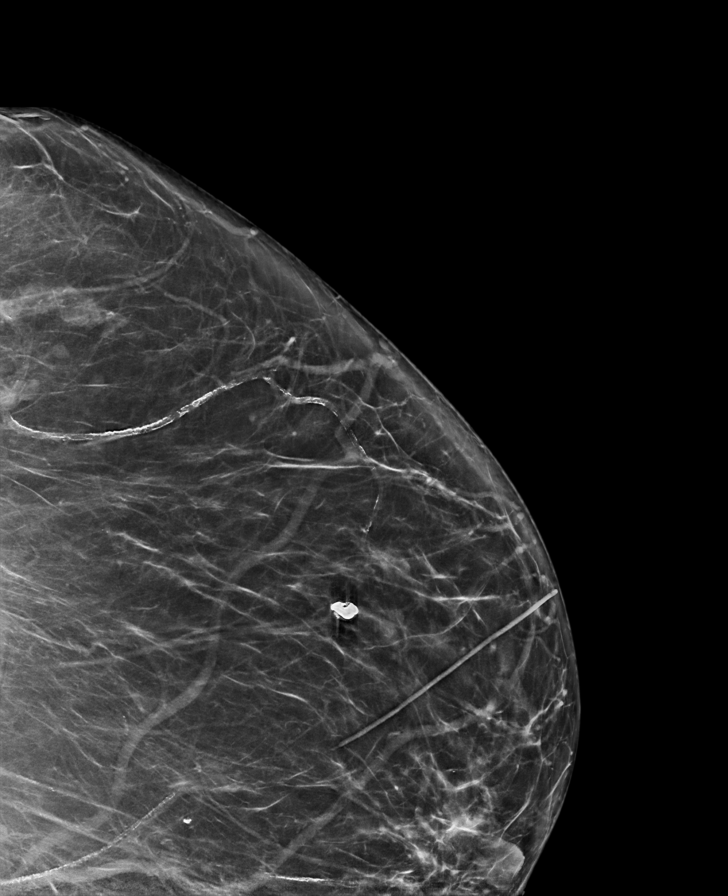

[R CC synth-2D]
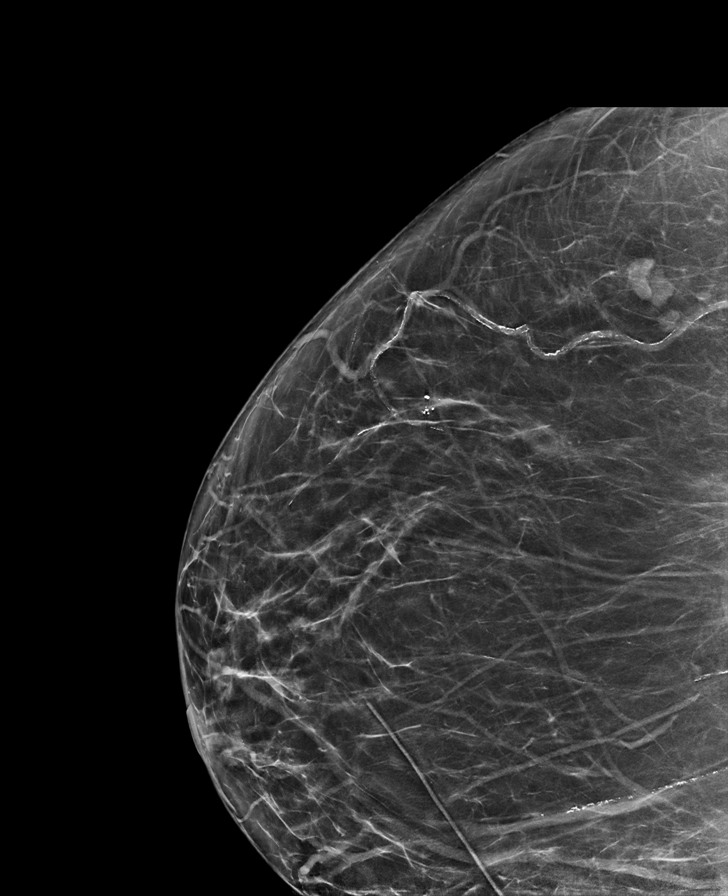

[R MLO synth-2D (1 of 2)]
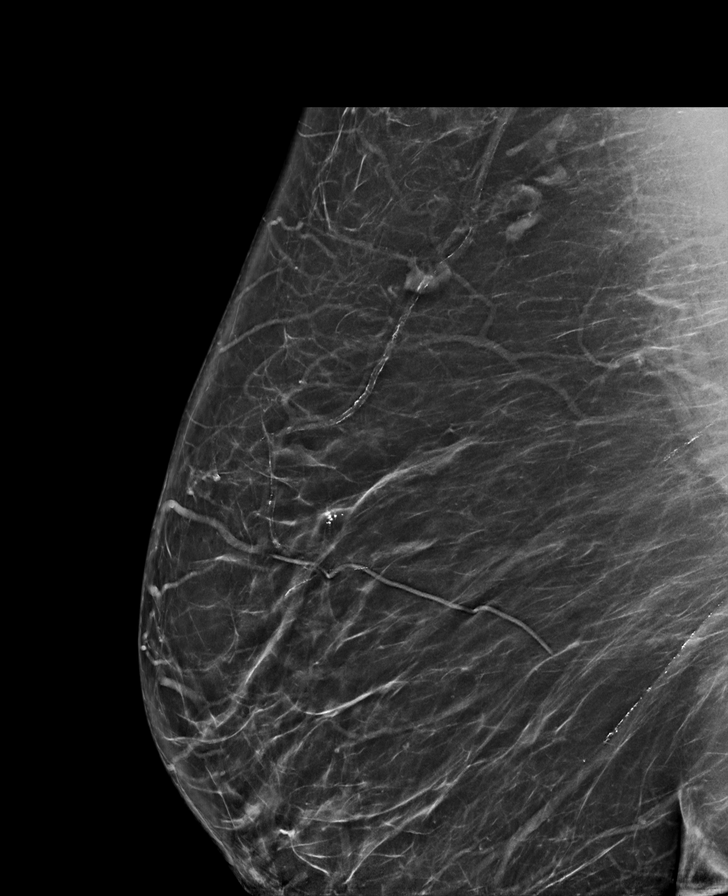

[R CV synth-2D]
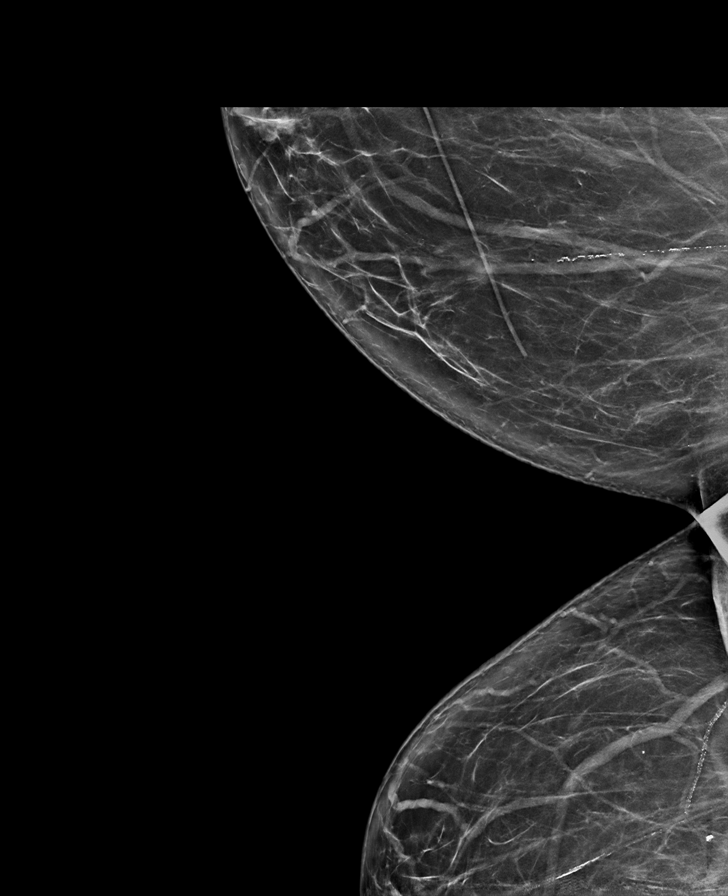

[R MLO synth-2D (2 of 2)]
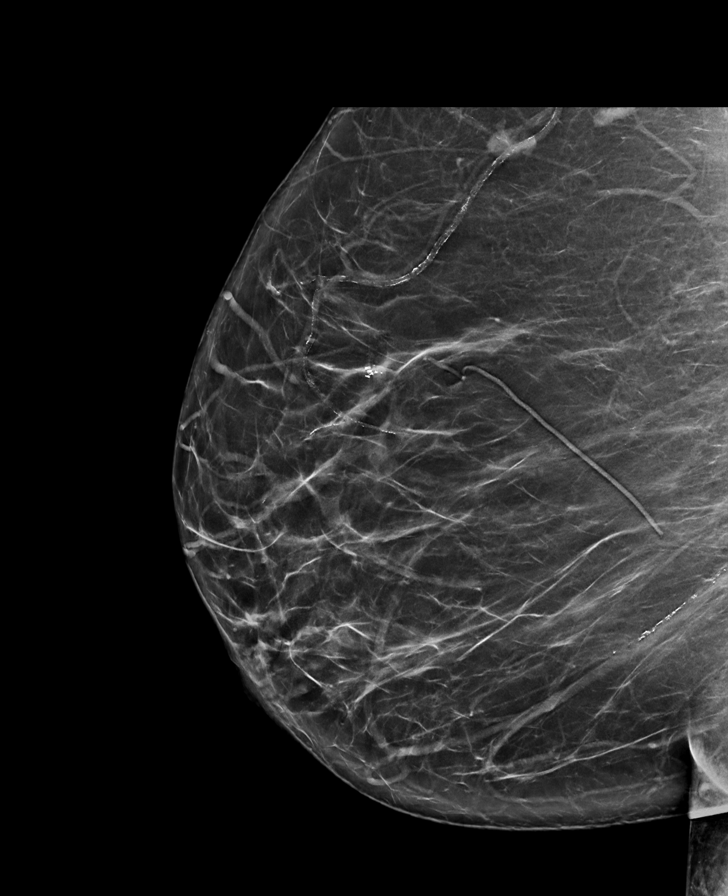

[R MLO tomo · tomo slice 47/92.0]
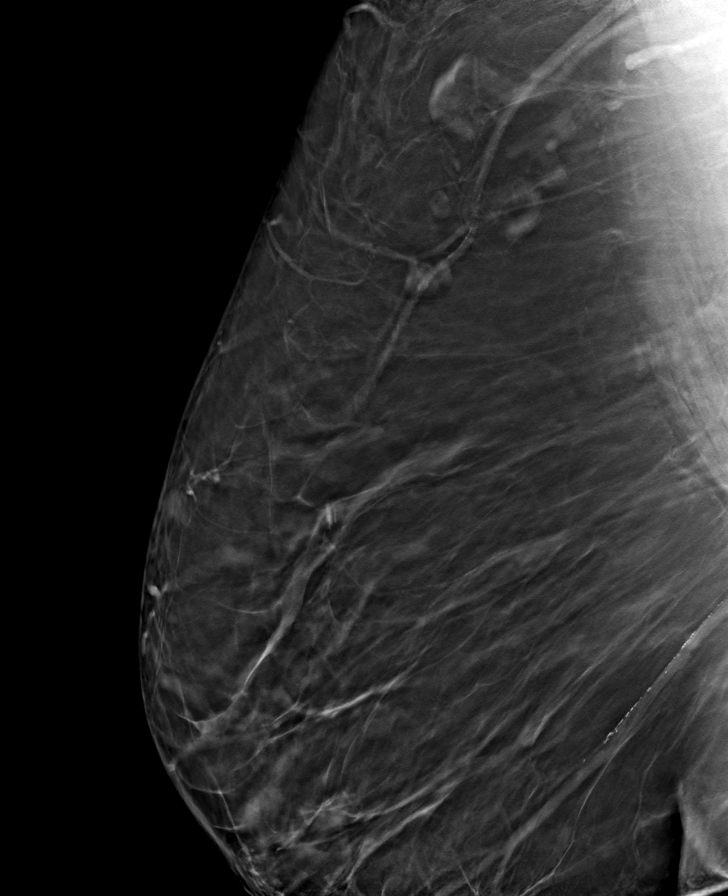

[8 of 40 positions shown; findings below may reference images not displayed]

ACR Breast Density Category b: There are scattered areas of
fibroglandular density.
FINDINGS: There are no findings suspicious for malignancy.
IMPRESSION: No mammographic evidence of malignancy. A result letter of this
screening mammogram will be mailed directly to the patient.

RECOMMENDATION:
Screening mammogram in one year. (Code:[BY])

BI-RADS CATEGORY  1: Negative.

## 2021-04-25 ENCOUNTER — Encounter
Payer: Medicaid Other | Attending: Physical Medicine and Rehabilitation | Admitting: Physical Medicine and Rehabilitation

## 2021-04-25 ENCOUNTER — Encounter: Payer: Self-pay | Admitting: Physical Medicine and Rehabilitation

## 2021-04-25 ENCOUNTER — Other Ambulatory Visit: Payer: Self-pay

## 2021-04-25 VITALS — BP 149/83 | HR 86 | Temp 98.7°F | Ht 61.0 in | Wt 252.4 lb

## 2021-04-25 DIAGNOSIS — M25369 Other instability, unspecified knee: Secondary | ICD-10-CM | POA: Diagnosis not present

## 2021-04-25 NOTE — Progress Notes (Signed)
Subjective:    Patient ID: Caroline Ryan, female    DOB: March 06, 1966, 55 y.o.   MRN: [id number]  HPI  An audio/video tele-health visit is felt to be the most appropriate encounter for this patient at this time. See MyChart message from today for the patient's consent to a tele-health encounter with Thomas. This is a follow up tele-visit via phone. The patient is at home. MD is at office.    Caroline Ryan presents for follow-up of bilateral knee pain. She is concerned that the MRI was not approved as feels she may have meniscal damage. Stairs worsen her pain- she uses rails and tries to minimize using the stairs. The other day her legs gave way.   Her pain has been well controlled, 8/10 today.   BP 152/92 in office today. It is usually 140/70. Diet has ben pretty good.   The cream was too expensive for her at [facility]. She has been trying to walk. Her mood is a little more down today. The handicap sticker allows her to go out more.  She has been taking ibuprofen '800mg'$  and Gabapentin. She has tried Tylenol in the past and it does not help much. She would like to repeat Monovisc next visit in December.   Weight is 253 lbs, down 2 lbs from last visit.   Prior history: She is feeling very fatigued today. I have bene unable to get her into see the OBGYN as they did not accept her insurance.   Prior history:  Caroline Ryan is a 55 year old woman who presents with bilateral knee OA. Last visit we did bilateral knee viscosupplementation injections that has helped her so much.   She was helping her dad move last night.   Her BP is well controlled.   She is taking the Norco about twice per day. This is the only medication that helps to ease her pain.   She hopes to be able to do shopping and spend time with her daughter.   She is 257 lbs, same weight is last time.   She is walking more than last month.   She wants to try a juice diet and help  get her weight off. She has been eating fruits and vegetables.  She has been noting sharp pains shooting down her legs and arms. She felt weak when she had this pain. Her arm buckled when she had this pain.  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   Pain is 9/10 on average, and 7/10 right now.   She was diagnosed with fibroids and is stressed by the fact that she has not heard from the gynecologist yet.   She is also having hot flashes from menopause and asks what kind of treatments are available.   Pain Inventory Average Pain 8 Pain Right Now 8 My pain is sharp, burning and stabbing  In the last 24 hours, has pain interfered with the following? General activity 8 Relation with others 7 Enjoyment of life 9 What TIME of day is your pain at its worst? daytime Sleep (in general) Fair  Pain is worse with: walking and sitting Pain improves with: rest, heat/ice, therapy/exercise, medication, TENS and injections Relief from Meds: 8  Family History  Problem Relation Age of Onset   Hypertension Father    Diabetes Brother    Diabetes Brother    Breast cancer Mother 22   Other Neg Hx    Social  History   Socioeconomic History   Marital status: Single    Spouse name: Not on file   Number of children: Not on file   Years of education: Not on file   Highest education level: Not on file  Occupational History   Not on file  Tobacco Use   Smoking status: Former    Packs/day: 0.25    Years: 2.00    Pack years: 0.50    Types: Cigarettes    Quit date: 08/20/2014    Years since quitting: 6.6   Smokeless tobacco: Never  Vaping Use   Vaping Use: Never used  Substance and Sexual Activity   Alcohol use: Yes    Alcohol/week: 2.0 standard drinks    Types: 2 Glasses of wine per week   Drug use: No   Sexual activity: Yes    Birth control/protection: Condom  Other Topics Concern   Not on file  Social History Narrative   Not on file   Social Determinants of Health    Financial Resource Strain: Not on file  Food Insecurity: Food Insecurity Present   Worried About [place] in the Last Year: Sometimes true   Ran Out of Food in the Last Year: Sometimes true  Transportation Needs: Unmet Transportation Needs   Lack of Transportation (Medical): Yes   Lack of Transportation (Non-Medical): Yes  Physical Activity: Not on file  Stress: Not on file  Social Connections: Not on file   Past Surgical History:  Procedure Laterality Date   ABDOMINAL HYSTERECTOMY     BILATERAL SALPINGECTOMY Bilateral 10/10/2017   Procedure: BILATERAL SALPINGECTOMY;  Surgeon: Chancy Milroy, MD;  Location: [facility];  Service: Gynecology;  Laterality: Bilateral;   BREAST BIOPSY     US guided core 2009   BREAST EXCISIONAL BIOPSY     right 1982 left [place]     bilateral cysts/benign   VAGINAL HYSTERECTOMY N/A 10/10/2017   Procedure: HYSTERECTOMY VAGINAL;  Surgeon: Chancy Milroy, MD;  Location: [facility];  Service: Gynecology;  Laterality: N/A;   Past Surgical History:  Procedure Laterality Date   ABDOMINAL HYSTERECTOMY     BILATERAL SALPINGECTOMY Bilateral 10/10/2017   Procedure: BILATERAL SALPINGECTOMY;  Surgeon: Chancy Milroy, MD;  Location: [facility];  Service: Gynecology;  Laterality: Bilateral;   BREAST BIOPSY     US guided core 2009   BREAST EXCISIONAL BIOPSY     right 1982 left [place]     bilateral cysts/benign   VAGINAL HYSTERECTOMY N/A 10/10/2017   Procedure: HYSTERECTOMY VAGINAL;  Surgeon: Chancy Milroy, MD;  Location: [facility];  Service: Gynecology;  Laterality: N/A;   Past Medical History:  Diagnosis Date   Anemia    Asthma    Depression    no meds   Eczema    Fibroid    Headache(784.0)    otc prn med   History of blood transfusion    Hypertension    Knee pain, bilateral    arthralgia knee pain bilateral   Seasonal allergies    SVD (spontaneous vaginal delivery)    x 3   Vaginal polyp 05/2020   BP (!) 149/83  Comment: last recorded  Pulse 86 Comment: last recorded  Temp 98.7 F (37.1 C) Comment: last recorded  Ht '5\' 1"'$  (1.549 m)   Wt 252 lb 6.4 oz (114.5 kg)   LMP 04/03/2017 (Approximate)   BMI 47.69 kg/m   Opioid Risk Score:  Fall Risk Score:  `1  Depression screen PHQ 2/9  Depression screen [facility] 2/9 04/25/2021 02/06/2021 08/09/2020 05/20/2020 04/07/2020 03/04/2020 02/16/2020  Decreased Interest '2 1 2 '$ 0 '1 1 1  '$ Down, Depressed, Hopeless '2 1 2 1 1 1 1  '$ PHQ - 2 Score '4 2 4 1 2 2 2  '$ Altered sleeping - - 2 - - - 0  Tired, decreased energy - - 1 - - - 1  Change in appetite - - 1 - - - 0  Feeling bad or failure about yourself  - - 1 - - - 0  Trouble concentrating - - 0 - - - 0  Moving slowly or fidgety/restless - - 0 - - - 0  Suicidal thoughts - - 0 - - - 0  PHQ-9 Score - - 9 - - - 3  Difficult doing work/chores - - - - - - -  Some recent data might be hidden    Review of Systems  Constitutional: Negative.   HENT: Negative.    Eyes: Negative.   Respiratory: Negative.    Cardiovascular: Negative.   Gastrointestinal: Negative.   Endocrine: Negative.   Genitourinary: Negative.   Musculoskeletal:  Positive for arthralgias and gait problem.  Skin: Negative.   Allergic/Immunologic: Negative.   Hematological: Negative.   Psychiatric/Behavioral: Negative.    All other systems reviewed and are negative.      Objective:   Physical Exam Seen via phone.     Assessment & Plan:  Caroline Ryan is a 55 year old woman presenting to establish care for bilateral chronic knee pain, neck pain.   1) Bilateral knee OA: -XRs reviewed and consistent with OA -She received steroid injections q73month. -She had viscosupplementation injections in July with excellent benefits. Repeat viscosupplementation injection next week.  -Vitamin D level normal.  -Prescribed compounding cream as follows: Diclofenac 3%, Gabapentin 5%, Lidocaine 5%, [facility] 1[facility]  apply 1-2 pumps three  to four times per day as needed-- this was too expensive for her to purchase. Recommended blue emu oil -Discussed her walking.Made goal to increase to 15 minutes at least 4 times per week.  -Discussed that every pound she loses is 6 lbs off her knees.  -Unable to  -Increase Gabapentin to '600mg'$  TID. Start at night and don't drive initially after starting higher dose as could make her very sleepy. Educated that Gabapentin can help with her hot flashes.  -Ordered MRI of bilateral knees given instability. Concern for meniscal tear -continue to work on weight loss -Zilretta bilateral as soon as available.    2) Muscle cramps:magneisum level was normal last visit.   3) Morbid obesity: Weight is 252, which is 5 lbs less! Discussed intermittent fasting and low carb diet. Continue healthy diet. Made goal to start eating at 11:15am instead of 11am and to move dinner time from 8:30pm to 8:15pm. HgbA1C 5.2- discussed that there is no prediabetes -referred to bariatric surgery  4) General health -Reviewed to gynecology for pap smear -Reviewed lipids and were normal last year.   5) Radicular symptoms in upper and lower extremities: -no evidence of weakness on exam to suggest myelopathy -Cervical XR results reviewed with her: they show mild spondylosis at C5-6 and C6-7.  No acute fracture. Lumbar XR reviewed with patient and shows 1. No acute compression fracture. 2. A 7 mm anterolisthesis of L4 on L5, presumably degenerative in etiology. 3. Multilevel degenerative disc disease and facet arthrosis. -Patient worried about heart attack-  advised that symptoms appear to be more radicular and are not accompanied by chest pain.   6) Palpitations:  -She has had occasional episodes -Denies chest pain. She gets SOB due to her asthma.   7) Iron deficiency anemia: Iron was very low in 2018- she has a history of menorrhagia and so she could continue to have anemia. Hemoglobin was within normal limits when  checked in May. Discussed iron rich foods as she prefers no supplement at this time.   10 minutes spent in discussion of bilateral knee pain, re-ordering MRIs given knee instability, weight loss efforts, fall risk, trying Zilretta injections next visit

## 2021-05-04 ENCOUNTER — Other Ambulatory Visit: Payer: Self-pay | Admitting: Physical Medicine and Rehabilitation

## 2021-05-04 DIAGNOSIS — M25369 Other instability, unspecified knee: Secondary | ICD-10-CM

## 2021-05-04 NOTE — Progress Notes (Signed)
This encounter was created in error - please disregard.

## 2021-05-12 ENCOUNTER — Other Ambulatory Visit: Payer: Self-pay

## 2021-05-12 ENCOUNTER — Ambulatory Visit: Payer: Medicaid Other | Admitting: Physical Medicine and Rehabilitation

## 2021-05-12 MED FILL — Lisinopril & Hydrochlorothiazide Tab 20-25 MG: ORAL | 30 days supply | Qty: 30 | Fill #3 | Status: CN

## 2021-05-12 MED FILL — Ibuprofen Tab 800 MG: ORAL | 10 days supply | Qty: 30 | Fill #2 | Status: CN

## 2021-05-17 ENCOUNTER — Other Ambulatory Visit: Payer: Medicaid Other

## 2021-05-19 ENCOUNTER — Other Ambulatory Visit: Payer: Self-pay

## 2021-05-19 MED FILL — Ibuprofen Tab 800 MG: ORAL | 10 days supply | Qty: 30 | Fill #2 | Status: AC

## 2021-05-19 MED FILL — Lisinopril & Hydrochlorothiazide Tab 20-25 MG: ORAL | 30 days supply | Qty: 30 | Fill #3 | Status: AC

## 2021-05-27 ENCOUNTER — Other Ambulatory Visit: Payer: Medicaid Other

## 2021-05-30 ENCOUNTER — Other Ambulatory Visit: Payer: Self-pay

## 2021-05-30 ENCOUNTER — Ambulatory Visit (INDEPENDENT_AMBULATORY_CARE_PROVIDER_SITE_OTHER): Payer: Medicaid Other | Admitting: Cardiology

## 2021-05-30 ENCOUNTER — Encounter: Payer: Self-pay | Admitting: Cardiology

## 2021-05-30 VITALS — BP 160/98 | HR 96 | Ht 61.0 in | Wt 257.2 lb

## 2021-05-30 DIAGNOSIS — R600 Localized edema: Secondary | ICD-10-CM | POA: Diagnosis not present

## 2021-05-30 DIAGNOSIS — I10 Essential (primary) hypertension: Secondary | ICD-10-CM

## 2021-05-30 DIAGNOSIS — R9431 Abnormal electrocardiogram [ECG] [EKG]: Secondary | ICD-10-CM | POA: Diagnosis not present

## 2021-05-30 LAB — BASIC METABOLIC PANEL
BUN/Creatinine Ratio: 23 (ref 9–23)
BUN: 15 mg/dL (ref 6–24)
CO2: 25 mmol/L (ref 20–29)
Calcium: 10.6 mg/dL — ABNORMAL HIGH (ref 8.7–10.2)
Chloride: 102 mmol/L (ref 96–106)
Creatinine, Ser: 0.64 mg/dL (ref 0.57–1.00)
Glucose: 109 mg/dL — ABNORMAL HIGH (ref 70–99)
Potassium: 3.8 mmol/L (ref 3.5–5.2)
Sodium: 140 mmol/L (ref 134–144)
eGFR: 103 mL/min/{1.73_m2} (ref >59)

## 2021-05-30 MED ORDER — LISINOPRIL 40 MG PO TABS
40.0000 mg | ORAL_TABLET | Freq: Every day | ORAL | 3 refills | Status: DC
  Filled 2021-05-30: qty 90, 90d supply, fill #0

## 2021-05-30 NOTE — Patient Instructions (Addendum)
Medication Instructions:  Your physician has recommended you make the following change in your medication:  1) STOP taking Zestoretic (lisinopril-HCTZ)  2) START taking lisinopril 40 mg daily   *If you need a refill on your cardiac medications before your next appointment, please call your pharmacy*   Lab Work: BMET in one week  If you have labs (blood work) drawn today and your tests are completely normal, you will receive your results only by: [place] (if you have MyChart) OR A paper copy in the mail If you have any lab test that is abnormal or we need to change your treatment, we will call you to review the results.   Testing/Procedures: Your physician has requested that you have an echocardiogram. Echocardiography is a painless test that uses sound waves to create images of your heart. It provides your doctor with information about the size and shape of your heart and how well your heart's chambers and valves are working. This procedure takes approximately one hour. There are no restrictions for this procedure.  Your physician has recommended that you have a sleep study. This test records several body functions during sleep, including: brain activity, eye movement, oxygen and carbon dioxide blood levels, heart rate and rhythm, breathing rate and rhythm, the flow of air through your mouth and nose, snoring, body muscle movements, and chest and belly movement.  Follow-Up: At [facility], you and your health needs are our priority.  As part of our continuing mission to provide you with exceptional heart care, we have created designated Provider Care Teams.  These Care Teams include your primary Cardiologist (physician) and Advanced Practice Providers (APPs -  Physician Assistants and Nurse Practitioners) who all work together to provide you with the care you need, when you need it.  Your next appointment:   3 month(s)  The format for your next appointment:   In  Person  Provider:   You may see Fransico Him, MD or one of the following Advanced Practice Providers on your designated Care Team:   Melina Copa, PA-C Ermalinda Barrios, PA-C  Other Instructions You have been referred to see our PharmD in the Hypertension clinic in one week.   You have been referred to see the [facility].

## 2021-05-30 NOTE — Progress Notes (Signed)
Cardiology CONSULT Note    Date:  05/30/2021   ID:  Caroline Ryan, DOB 1966/08/14, MRN [id number]  PCP:  [name], FNP (Inactive)  Cardiologist:  Fransico Him, MD   Chief Complaint  Patient presents with   New Patient (Initial Visit)    Marin Comment edema      History of Present Illness:  Caroline Ryan is a 55 y.o. female who is being seen today for the evaluation of lower extremity edema at the request of Raulkar, Clide Deutscher, MD.  This is a 55yo AAF with a hx of asthma, anemia and HTN who has been having problems with LE edema recently. She was found to have an abnormal EKG with LVH by voltage and LAFB.  She tells me that she has been having problems with LE edema since she started having problems with her knees.  She was started on Lasix 20mg  daily which has helped with her edema.  She is on Gabapentin and the dose was increased recently and her LE edema has persisted.  She denies any chest pain or pressure but does admit to DOE when she over does it working in her house.  She denies any dizziness, palpitations or syncope.  She is on Lasix and HCTZ at present. She does not salt her food but does admit to eating sausage and bacon but no chips and does not eat out.  Past Medical History:  Diagnosis Date   Anemia    Asthma    Depression    no meds   Eczema    Fibroid    Headache(784.0)    otc prn med   History of blood transfusion    Hypertension    Knee pain, bilateral    arthralgia knee pain bilateral   Seasonal allergies    SVD (spontaneous vaginal delivery)    x 3   Vaginal polyp 05/2020    Past Surgical History:  Procedure Laterality Date   ABDOMINAL HYSTERECTOMY     BILATERAL SALPINGECTOMY Bilateral 10/10/2017   Procedure: BILATERAL SALPINGECTOMY;  Surgeon: Chancy Milroy, MD;  Location: [facility];  Service: Gynecology;  Laterality: Bilateral;   BREAST BIOPSY     US guided core 2009   BREAST EXCISIONAL BIOPSY     right 1982 left [place]     bilateral cysts/benign   VAGINAL HYSTERECTOMY N/A 10/10/2017   Procedure: HYSTERECTOMY VAGINAL;  Surgeon: Chancy Milroy, MD;  Location: [facility];  Service: Gynecology;  Laterality: N/A;    Current Medications: Current Meds  Medication Sig   acetaminophen (TYLENOL) 500 MG tablet Take 2 tablets (1,000 mg total) by mouth 2 (two) times daily as needed.   albuterol (PROAIR HFA) 108 (90 Base) MCG/ACT inhaler INHALE 1 PUFF EVERY 6 HOURS AS NEEDED FOR SHORTNESS OF BREATH   cetirizine (ZYRTEC) 10 MG tablet Take 1 tablet (10 mg total) by mouth daily.   diclofenac (VOLTAREN) 75 MG EC tablet TAKE 1 TABLET BY MOUTH TWICE DAILY AS NEEDED   diclofenac Sodium (VOLTAREN) 1 % GEL Apply 4 g topically 4 (four) times daily.   docusate sodium (COLACE) 100 MG capsule Take 1 capsule (100 mg total) by mouth 2 (two) times daily as needed.   furosemide (LASIX) 20 MG tablet Take 1 tablet (20 mg total) by mouth daily.   gabapentin (NEURONTIN) 600 MG tablet Take 1 tablet (600 mg total) by mouth 3 (three) times daily.   hydrocortisone 2.5 % cream Apply  topically 2 (two) times daily.   hydrOXYzine (ATARAX/VISTARIL) 25 MG tablet Take 1 tablet (25 mg total) by mouth 3 (three) times daily as needed.   ibuprofen (ADVIL) 800 MG tablet Take 1 tablet (800 mg total) by mouth 3 (three) times daily.   lisinopril-hydrochlorothiazide (ZESTORETIC) 20-25 MG tablet TAKE 1 TABLET BY MOUTH ONCE A DAY   montelukast (SINGULAIR) 10 MG tablet TAKE 1 TABLET (10 MG TOTAL) BY MOUTH AT BEDTIME.   NONFORMULARY OR COMPOUNDED ITEM Diclofenac 3%, Gabapentin 5%, Lidocaine 5%, Menthol 1%.  Apply 1-2 pumps three to four times per day as needed    Allergies:   Patient has no known allergies.   Social History   Socioeconomic History   Marital status: Single    Spouse name: Not on file   Number of children: Not on file   Years of education: Not on file   Highest education level: Not on file  Occupational History   Not on file   Tobacco Use   Smoking status: Former    Packs/day: 0.25    Years: 2.00    Pack years: 0.50    Types: Cigarettes    Quit date: 08/20/2014    Years since quitting: 6.7   Smokeless tobacco: Never  Vaping Use   Vaping Use: Never used  Substance and Sexual Activity   Alcohol use: Yes    Alcohol/week: 2.0 standard drinks    Types: 2 Glasses of wine per week   Drug use: No   Sexual activity: Yes    Birth control/protection: Condom  Other Topics Concern   Not on file  Social History Narrative   Not on file   Social Determinants of Health   Financial Resource Strain: Not on file  Food Insecurity: Food Insecurity Present   Worried About Charity fundraiser in the Last Year: Sometimes true   Arboriculturist in the Last Year: Sometimes true  Transportation Needs: Unmet Transportation Needs   Lack of Transportation (Medical): Yes   Lack of Transportation (Non-Medical): Yes  Physical Activity: Not on file  Stress: Not on file  Social Connections: Not on file     Family History:  The patient's family history includes Breast cancer (age of onset: 22) in her mother; Diabetes in her brother and brother; Hypertension in her father.   ROS:   Please see the history of present illness.    ROS All other systems reviewed and are negative.  No flowsheet data found.   PHYSICAL EXAM:   VS:  BP (!) 160/98   Pulse 96   Ht 5\' 1"  (1.549 m)   Wt 257 lb 3.2 oz (116.7 kg)   LMP 04/03/2017 (Approximate)   SpO2 98%   BMI 48.60 kg/m    GEN: Well nourished, well developed, in no acute distress  HEENT: normal  Neck: no JVD, carotid bruits, or masses Cardiac: RRR; no murmurs, rubs, or gallops,no edema.  Intact distal pulses bilaterally.  Respiratory:  clear to auscultation bilaterally, normal work of breathing GI: soft, nontender, nondistended, + BS MS: no deformity or atrophy  Skin: warm and dry, no rash Neuro:  Alert and Oriented x 3, Strength and sensation are intact Psych: euthymic mood,  full affect  Wt Readings from Last 3 Encounters:  05/30/21 257 lb 3.2 oz (116.7 kg)  04/25/21 252 lb 6.4 oz (114.5 kg)  02/06/21 252 lb 6.4 oz (114.5 kg)      Studies/Labs Reviewed:   EKG:  EKG is ordered  today.  The ekg ordered today demonstrates NSR with LVH and LAFB  Recent Labs: 03/23/2021: BNP 41.0; BUN 18; Creatinine, Ser 0.79; Potassium 4.2; Sodium 141   Lipid Panel    Component Value Date/Time   CHOL 180 12/24/2018 1404   TRIG 57 12/24/2018 1404   HDL 72 12/24/2018 1404   CHOLHDL 2.5 12/24/2018 1404   CHOLHDL 2.0 06/27/2015 1216   VLDL 9 06/27/2015 1216   [place] 97 12/24/2018 1404    Additional studies/ records that were reviewed today include:  OV notes from PCP    ASSESSMENT:    1. Leg edema   2. Nonspecific abnormal electrocardiogram (ECG) (EKG)   3. HYPERTENSION, BENIGN ESSENTIAL   4. Morbid obesity (HCC)      PLAN:  In order of problems listed above:  LE edema -I suspect this is multifactorial related to morbid obesity, gabapentin side effect and dietary indiscretion with Na intake -she has no LE edema on exam today -continue Lasix 20mg  daily -talk with PCP to get off gabapentin which she thinks is not helping at all with pain -check 2D echo to assess LVF -encouraged her to follow a < 2gm Na diet  Abnormal EKG -there is LVH by voltage -I will get a 2D echo to assess LVF and LVH  HTN -BP poorly controlled on exam -stop Lisinopril HCT -start Lisinopril 40mg  daily -check BMET in 1 week -PharmD HTN clinic in 1 week -get in lab PSG to assess for sleep apnea  Morbid Obesity -she has bad knees and needs her knees fixed but weight is too high -I will refer to [facility]  Time Spent: 25 minutes total time of encounter, including 15 minutes spent in face-to-face patient care on the date of this encounter. This time includes coordination of care and counseling regarding above mentioned problem list. Remainder of non-face-to-face  time involved reviewing chart documents/testing relevant to the patient encounter and documentation in the medical record. I have independently reviewed documentation from referring provider  Medication Adjustments/Labs and Tests Ordered: Current medicines are reviewed at length with the patient today.  Concerns regarding medicines are outlined above.  Medication changes, Labs and Tests ordered today are listed in the Patient Instructions below.  There are no Patient Instructions on file for this visit.   Signed, Fransico Him, MD  05/30/2021 10:50 AM    [address], [place], [place]  [address] Phone: [phone]; Fax: [phone]

## 2021-05-30 NOTE — Addendum Note (Signed)
Addended by: Eulis Foster on: 05/30/2021 11:08 AM   Modules accepted: Orders

## 2021-05-30 NOTE — Addendum Note (Signed)
Addended by: Antonieta Iba on: 05/30/2021 10:56 AM   Modules accepted: Orders

## 2021-06-02 ENCOUNTER — Telehealth: Payer: Self-pay | Admitting: *Deleted

## 2021-06-02 ENCOUNTER — Other Ambulatory Visit: Payer: Self-pay

## 2021-06-02 NOTE — Telephone Encounter (Signed)
Prior Authorization for NPSG sent to [facility] via Phone. CASE PENDING [id number]

## 2021-06-02 NOTE — Telephone Encounter (Signed)
-----   Message from Antonieta Iba, RN sent at 05/30/2021 11:00 AM EDT ----- In-lab PSG sleep study has been ordered.  Thanks!

## 2021-06-05 ENCOUNTER — Ambulatory Visit
Admission: RE | Admit: 2021-06-05 | Discharge: 2021-06-05 | Disposition: A | Discharge status: Home / Self Care | Payer: Medicaid Other | Source: Ambulatory Visit | Attending: Physical Medicine and Rehabilitation | Admitting: Physical Medicine and Rehabilitation

## 2021-06-05 DIAGNOSIS — M23222 Derangement of posterior horn of medial meniscus due to old tear or injury, left knee: Secondary | ICD-10-CM | POA: Diagnosis not present

## 2021-06-05 DIAGNOSIS — M25461 Effusion, right knee: Secondary | ICD-10-CM | POA: Diagnosis not present

## 2021-06-05 DIAGNOSIS — M25369 Other instability, unspecified knee: Secondary | ICD-10-CM

## 2021-06-05 DIAGNOSIS — M7122 Synovial cyst of popliteal space [Baker], left knee: Secondary | ICD-10-CM | POA: Diagnosis not present

## 2021-06-05 DIAGNOSIS — R600 Localized edema: Secondary | ICD-10-CM | POA: Diagnosis not present

## 2021-06-05 DIAGNOSIS — M1712 Unilateral primary osteoarthritis, left knee: Secondary | ICD-10-CM | POA: Diagnosis not present

## 2021-06-05 DIAGNOSIS — M25462 Effusion, left knee: Secondary | ICD-10-CM | POA: Diagnosis not present

## 2021-06-05 DIAGNOSIS — M23221 Derangement of posterior horn of medial meniscus due to old tear or injury, right knee: Secondary | ICD-10-CM | POA: Diagnosis not present

## 2021-06-05 IMAGING — MR MR KNEE*R* W/O CM
4 of 6 series · 19 of 40 positions shown · non-contrast
Comparison: Right knee radiograph [DATE]

CLINICAL DATA: severe pain despite multiple injections and
conservative care

EXAM:
MRI OF THE RIGHT KNEE WITHOUT CONTRAST
TECHNIQUE: Multiplanar, multisequence MR imaging of the knee was performed. No
intravenous contrast was administered.

[Series 3: T2 fat-sat · axial · 4.0mm · 0.31mm/px · z∈[-51,+64]mm · 3 of 38 slices shown (1 of 2)]
[im 6/38]
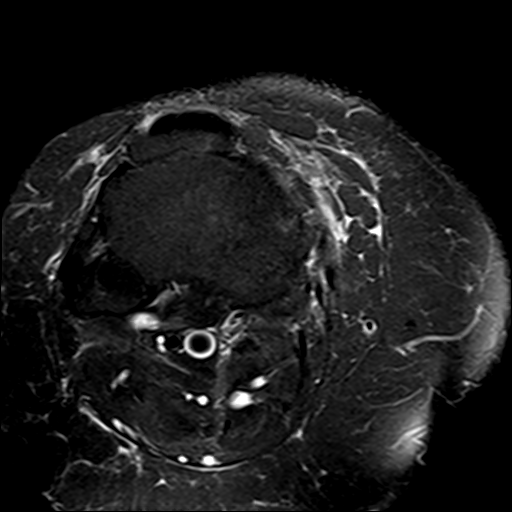
[im 22/38]
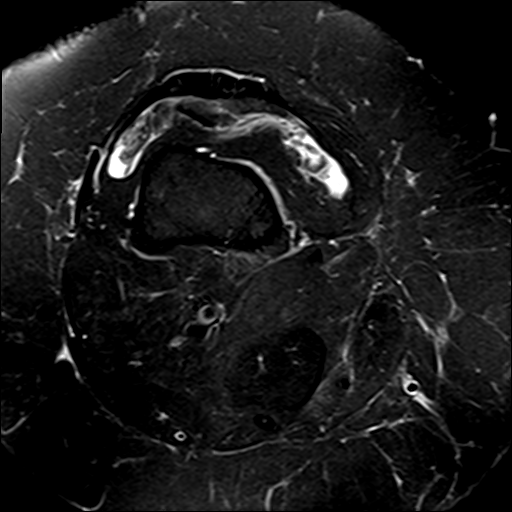
[im 32/38]
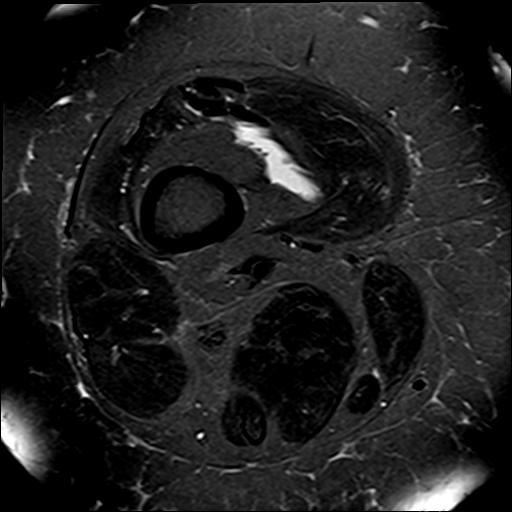

[Series 5: T2 fat-sat · coronal · 4.0mm · 0.31mm/px · 3 of 26 slices shown (2 of 2)]
[im 6/26]
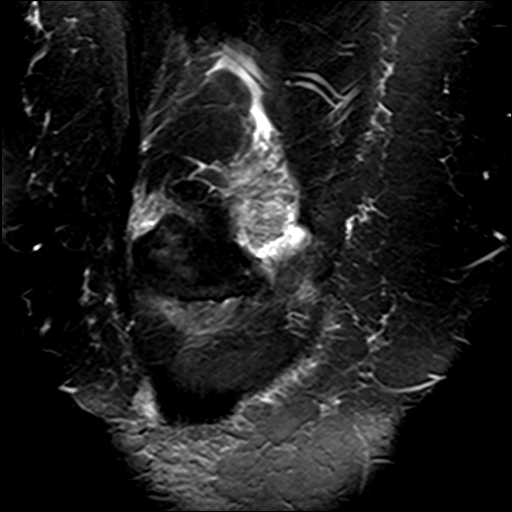
[im 16/26]
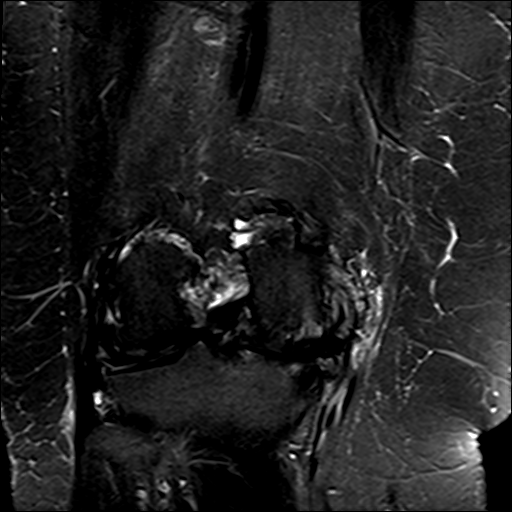
[im 26/26]
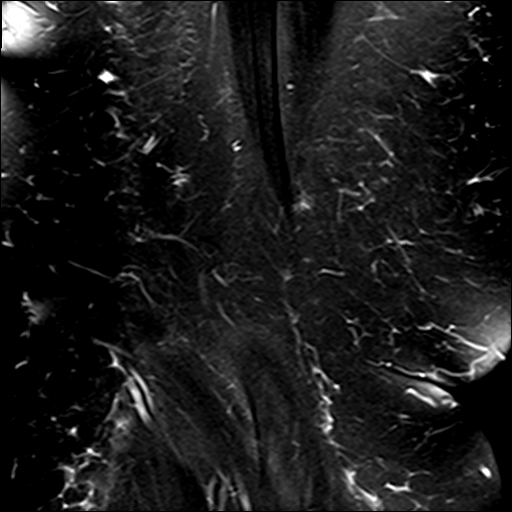

[Series 6: PD fat-sat · coronal · 4.0mm · 0.31mm/px · 6 of 26 slices shown (1 of 2)]
[im 1/26]
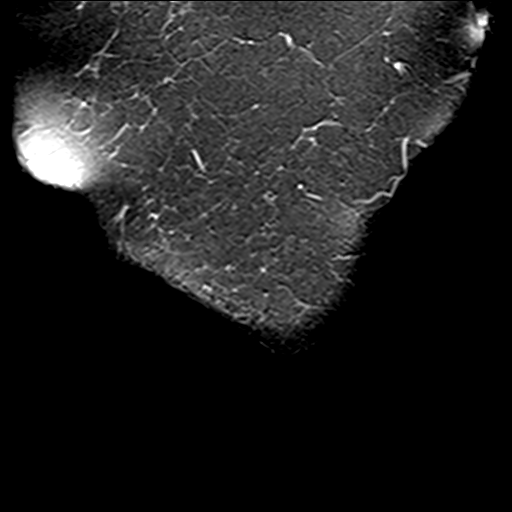
[im 6/26]
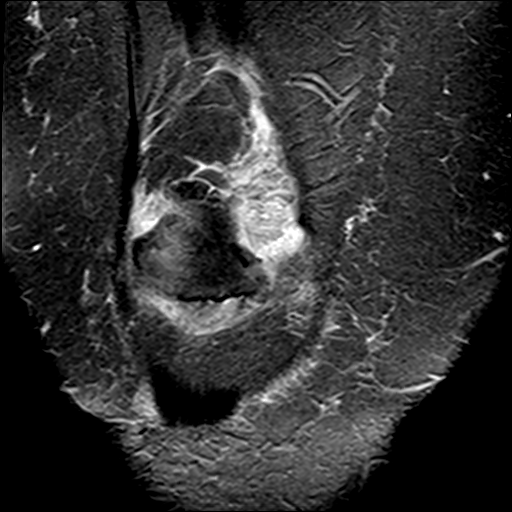
[im 11/26]
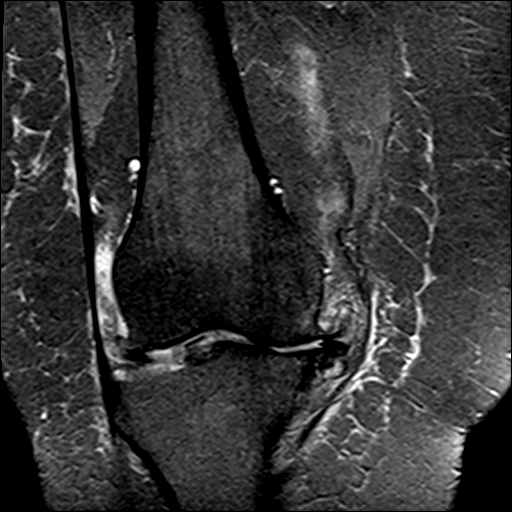
[im 16/26]
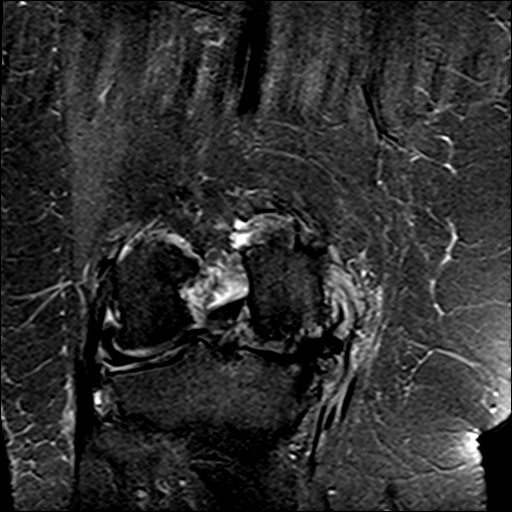
[im 21/26]
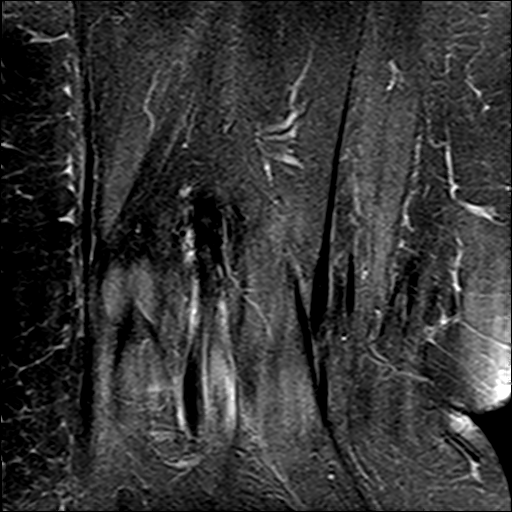
[im 26/26]
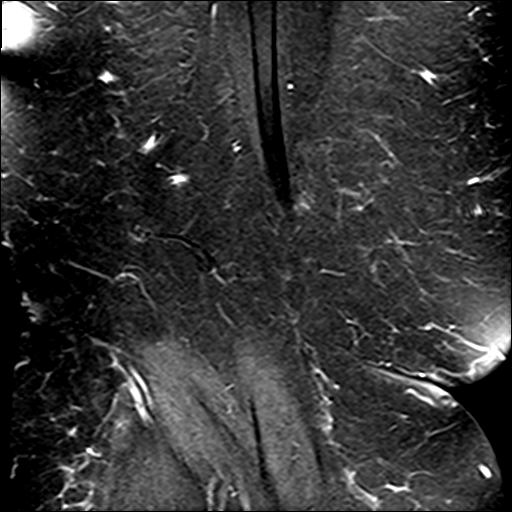

[Series 7: PD fat-sat · sagittal · 3.0mm · 0.31mm/px · 7 of 33 slices shown (2 of 2)]
[im 1/33]
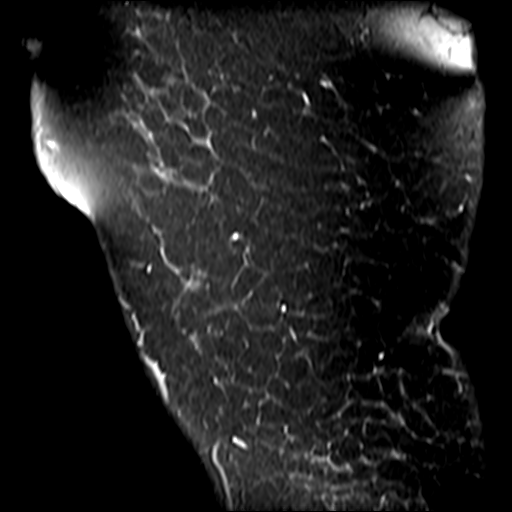
[im 6/33]
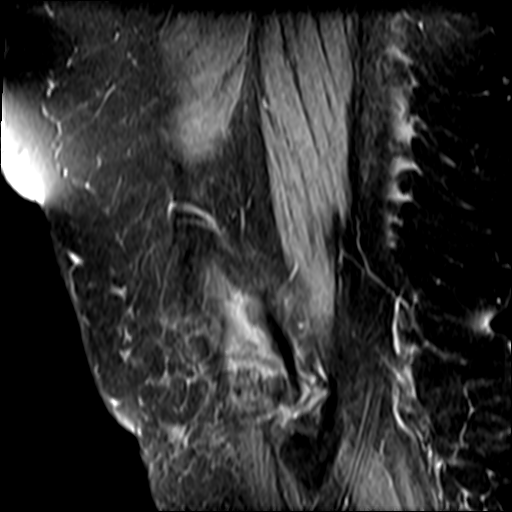
[im 11/33]
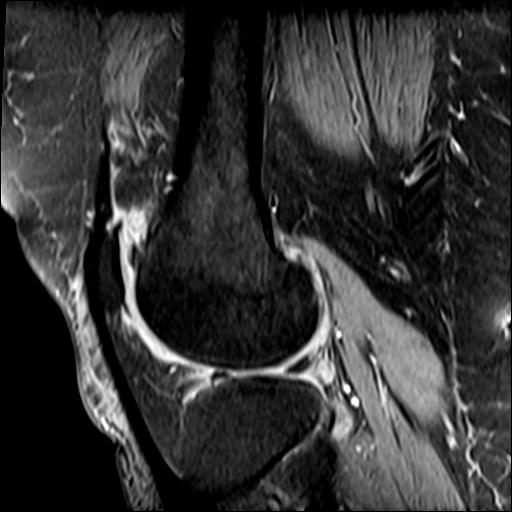
[im 17/33]
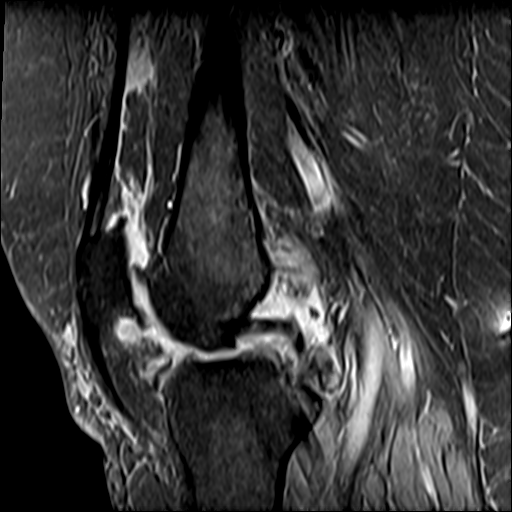
[im 22/33]
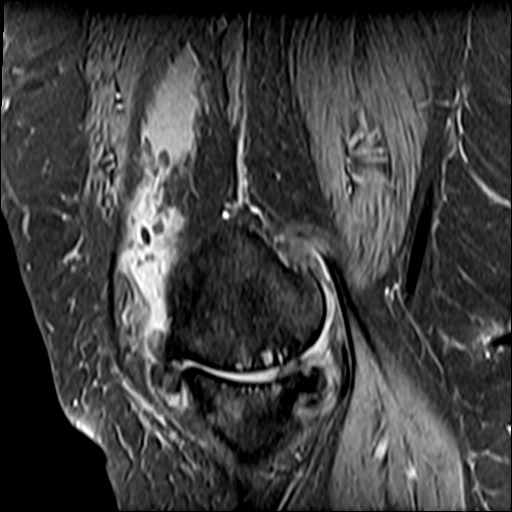
[im 27/33]
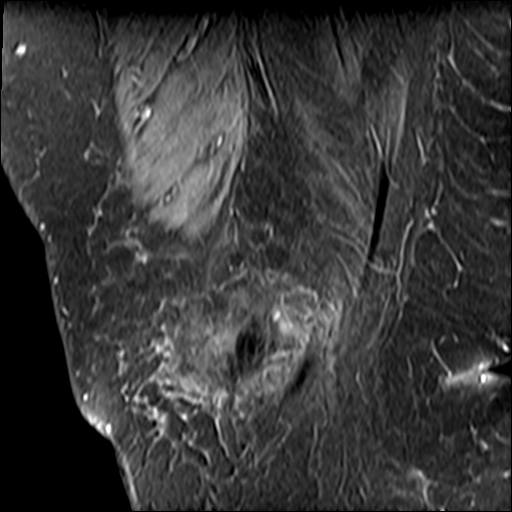
[im 33/33]
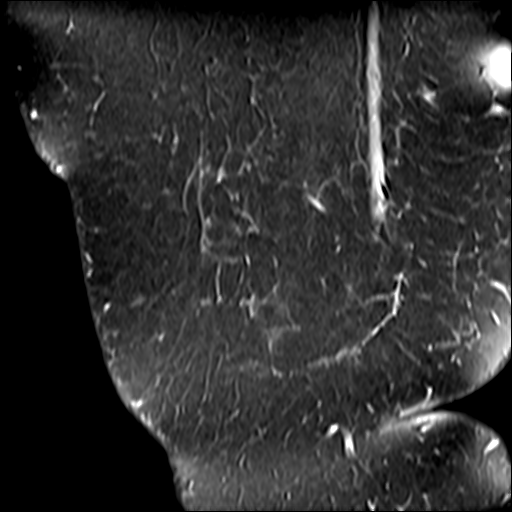

[19 of 40 positions shown; findings below may reference images not displayed]

FINDINGS: MENISCI

Medial: Degenerative tearing of the posterior horn and body of the
medial meniscus, with radial component in the posterior horn and
mild meniscal extrusion.

Lateral: Intact.

LIGAMENTS

Cruciates: Likely intact ACL.  Intact PCL.

Collaterals: Bowing of the medial collateral ligament due to
meniscal extrusion and bulky osteophyte formation. Lateral
collateral ligament complex is intact.

CARTILAGE

Patellofemoral: Intermediate grade partial-thickness cartilage loss
along the medial patellar facet.

Medial: Severe, full-thickness cartilage loss along the
weight-bearing surfaces, with adjacent subchondral marrow edema and
cystic change.

Lateral:  Mild chondrosis.

JOINT: Moderate-sized joint effusion with synovitis. Hypertrophied
prefemoral fat pad.

POPLITEAL FOSSA: No Baker's cyst.

EXTENSOR MECHANISM: Intact quadriceps tendon. Intact patellar
tendon.

BONES: Subchondral marrow edema along the medial compartment and
cystic change. Tricompartment osteophyte formation. No acute
fracture or dislocation. No aggressive osseous lesion.

Other: No additional findings.
IMPRESSION: Tricompartment osteoarthritis, severe in the medial compartment.
Degenerative tearing of the posterior horn and body of the medial
meniscus, with radial component in the posterior horn. Severe,
full-thickness cartilage loss along the weight-bearing surfaces of
medial compartment with subchondral marrow edema and cystic change.

Moderate-sized joint effusion with synovitis.

## 2021-06-05 IMAGING — MR MR KNEE*L* W/O CM
4 of 6 series · 22 of 40 positions shown · non-contrast
Comparison: Left knee radiograph [DATE]

CLINICAL DATA: severe pain despite multiple injections and
conservative care

EXAM:
MRI OF THE LEFT KNEE WITHOUT CONTRAST
TECHNIQUE: Multiplanar, multisequence MR imaging of the knee was performed. No
intravenous contrast was administered.

[Series 3: T2 fat-sat · axial · 4.0mm · 0.50mm/px · z∈[-60,+40]mm · 6 of 26 slices shown (1 of 2)]
[im 1/26]
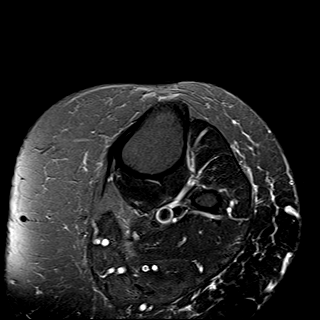
[im 5/26]
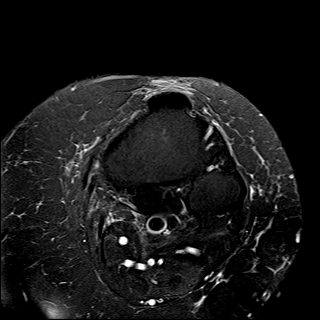
[im 9/26]
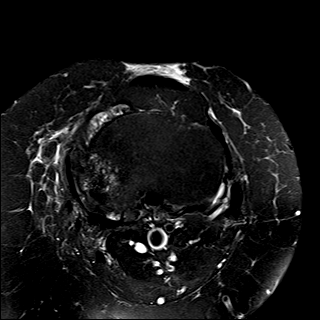
[im 13/26]
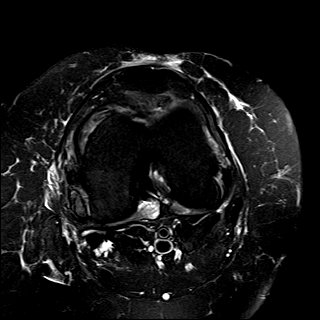
[im 17/26]
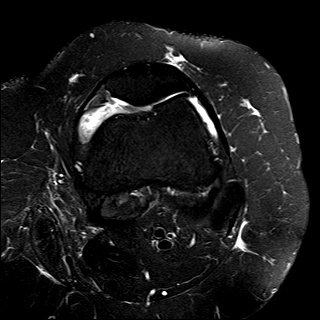
[im 21/26]
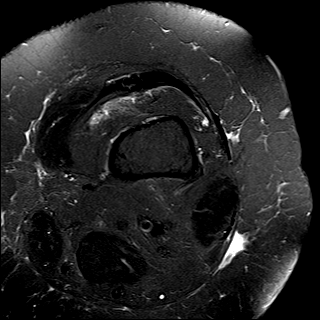

[Series 5: T2 fat-sat · coronal · 4.0mm · 0.29mm/px · 3 of 24 slices shown (2 of 2)]
[im 5/24]
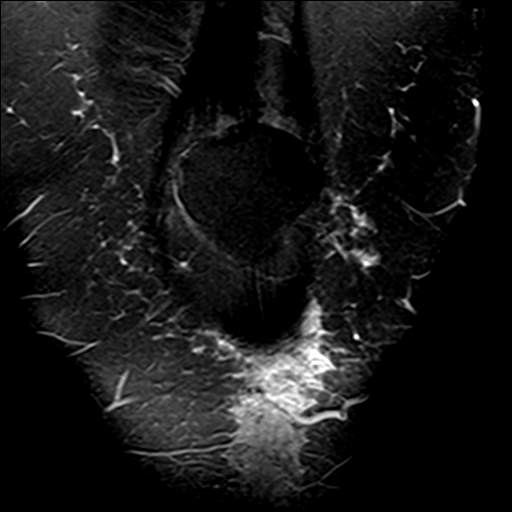
[im 14/24]
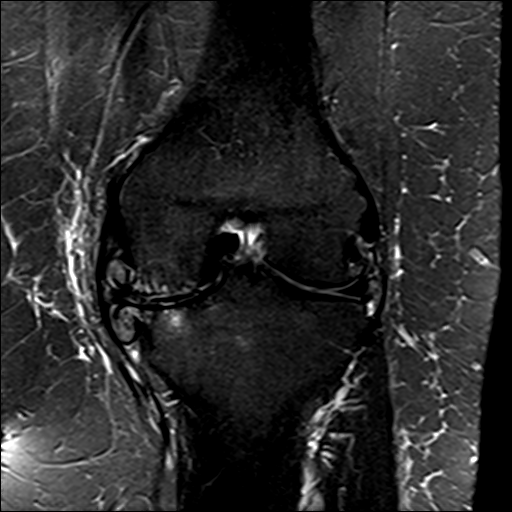
[im 24/24]
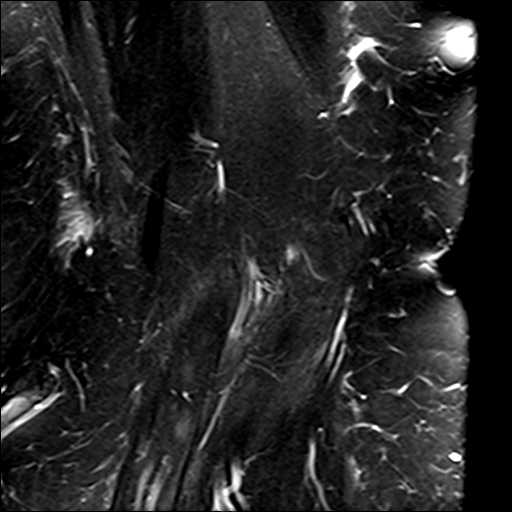

[Series 7: PD fat-sat · sagittal · 3.0mm · 0.29mm/px · 7 of 27 slices shown (1 of 2)]
[im 1/27]
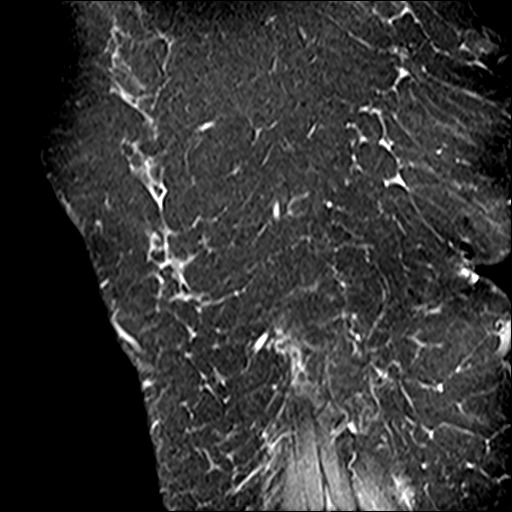
[im 5/27]
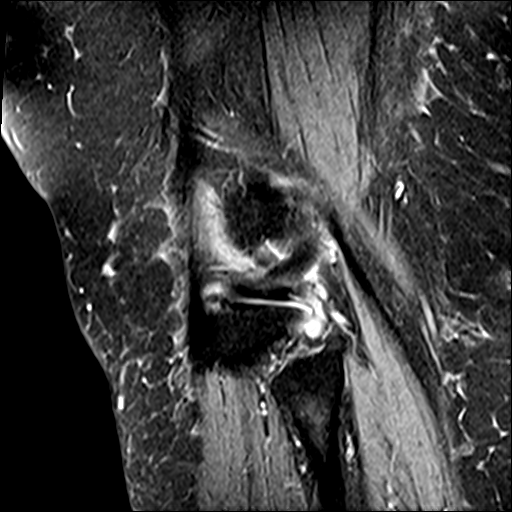
[im 9/27]
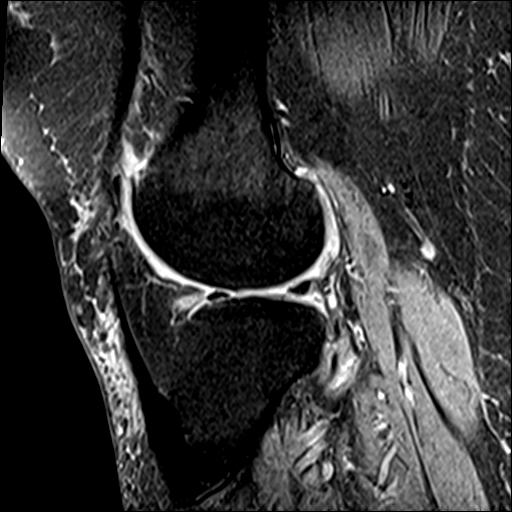
[im 14/27]
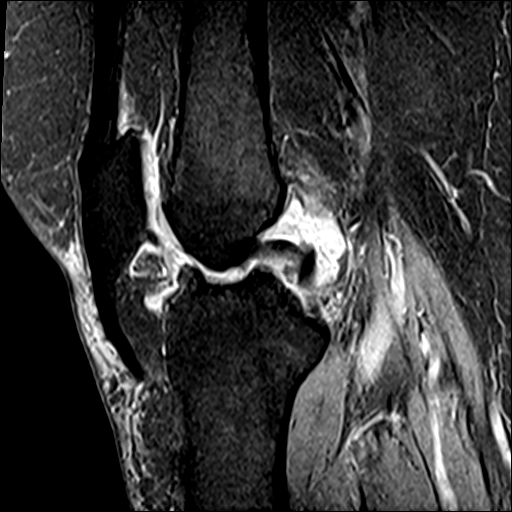
[im 18/27]
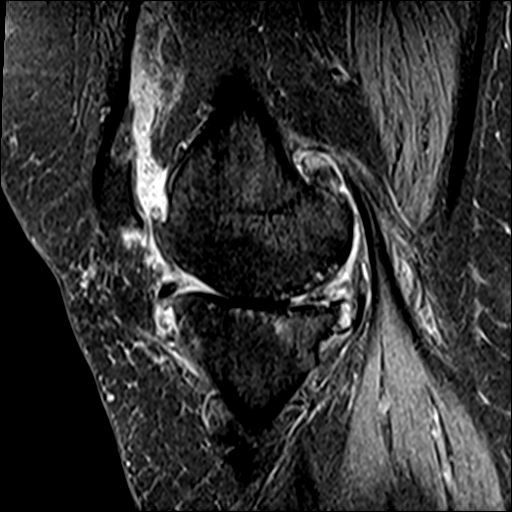
[im 22/27]
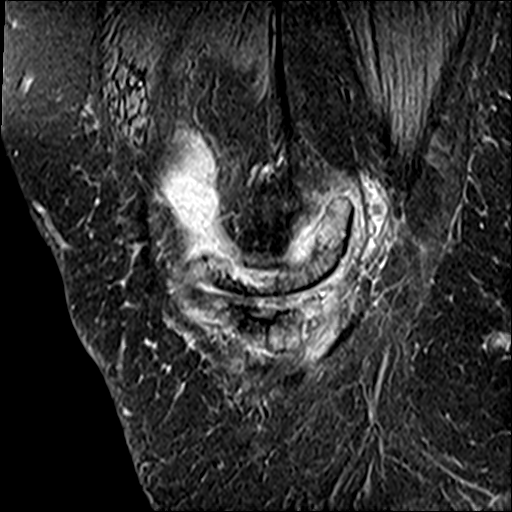
[im 27/27]
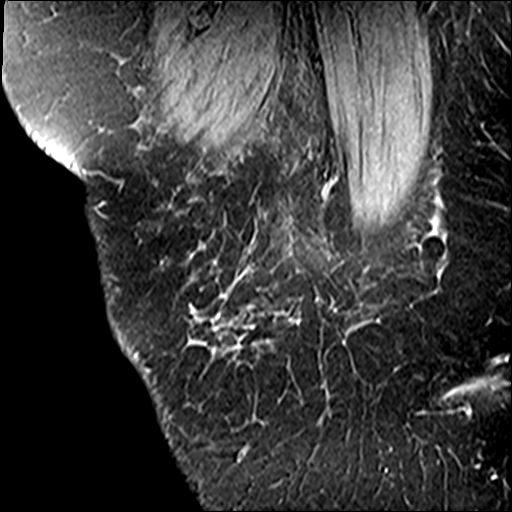

[Series 8: PD fat-sat · coronal · 4.0mm · 0.29mm/px · 6 of 24 slices shown (2 of 2)]
[im 1/24]
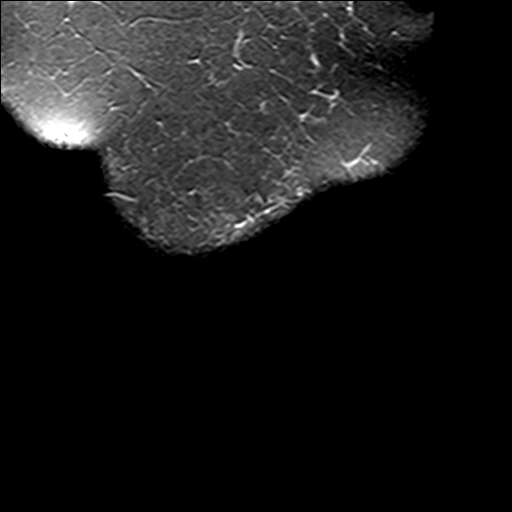
[im 5/24]
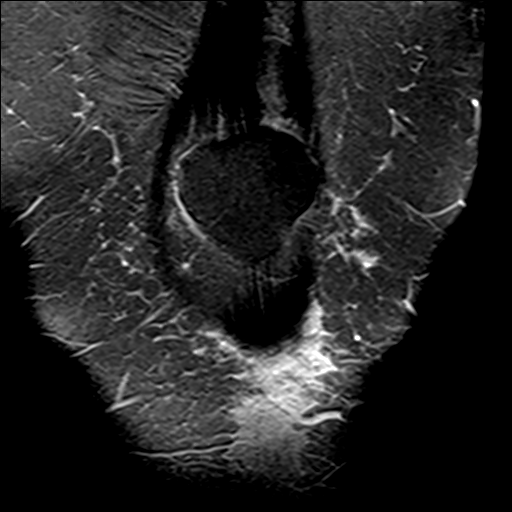
[im 10/24]
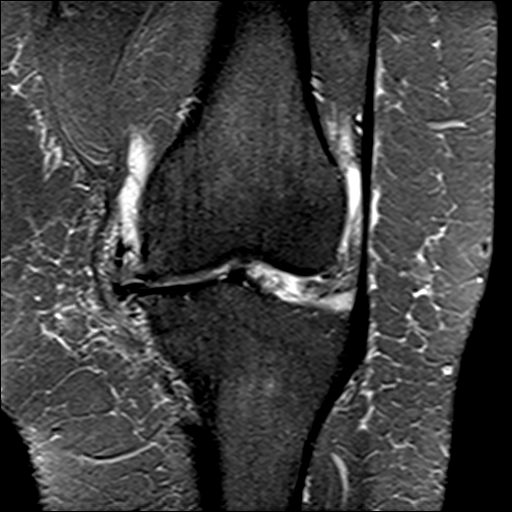
[im 14/24]
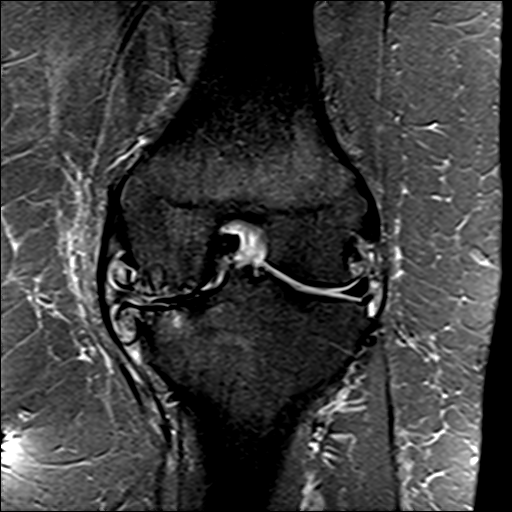
[im 19/24]
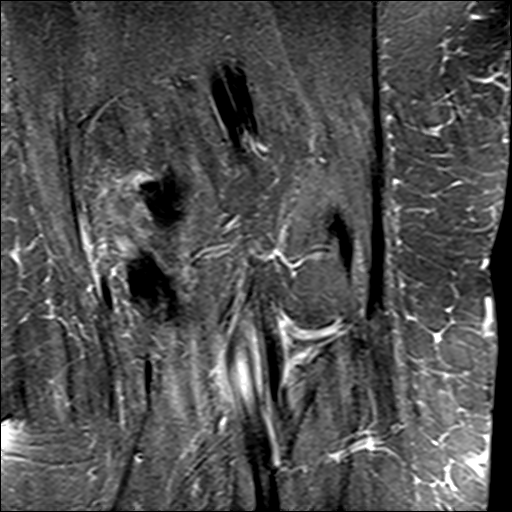
[im 24/24]
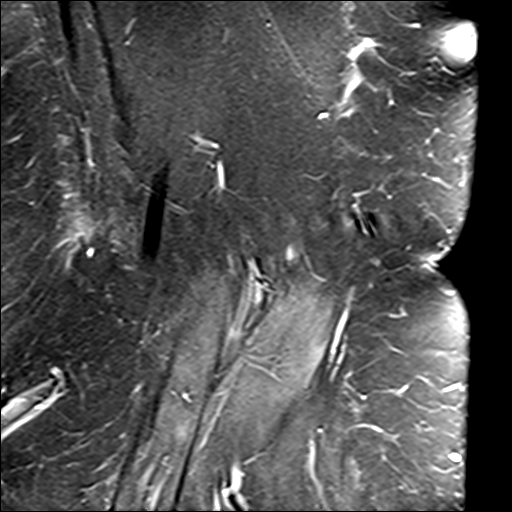

[22 of 40 positions shown; findings below may reference images not displayed]

FINDINGS: MENISCI

Medial: Degenerative tearing and macerated appearance of the
posterior horn and body of the medial meniscus.

Lateral: Intact.

LIGAMENTS

Cruciates: Diminutive appearing ACL with ganglia formation
proximally near its attachment on the femur. The PCL is intact.

Collaterals: Bowing of the MCL due to extruded meniscus and bulky
osteophytes. Lateral collateral ligament complex is intact.

CARTILAGE

Patellofemoral: There is and intermediate grade cartilage loss along
the medial patellar facet.

Medial: Full-thickness cartilage loss with bone on bone articulation
along the weight-bearing surfaces.

Lateral:  Mild chondrosis.

JOINT: Small joint effusion with synovitis.

POPLITEAL FOSSA: Small Baker's cyst which contains a small
osteochondral joint body measuring 4 mm (axial T2 image 15).

EXTENSOR MECHANISM: Intact quadriceps tendon. Intact patellar
tendon.

BONES: No aggressive osseous lesion. No fracture or dislocation.
Tricompartment osteophyte formation. Subchondral cystic change in
edema related to degenerative arthritis in the medial compartment.

Other: No additional findings.
IMPRESSION: Tricompartment osteoarthritis, severe in the medial compartment.
Degenerative tearing and macerated appearance of the posterior horn
and body of the medial meniscus. Full-thickness cartilage loss with
subchondral marrow edema and cystic change along the weight-bearing
surfaces of the medial compartment.

Diminutive appearing ACL with ganglion formation proximally near its
attachment on the femur, possibly with chronic partial tearing,
correlate with instability.

Small joint effusion with synovitis.

Small Baker cyst containing a 4 mm osteochondral joint body.

## 2021-06-06 NOTE — Progress Notes (Addendum)
Patient ID: Caroline Ryan [facility]                 DOB: 1966-05-06                      MRN: [id number]     HPI: Caroline Ryan is a 55 y.o. female referred by Dr. Radford Pax to HTN clinic. PMH is significant for asthma, HTN, obesity, and bilateral knee pain. Pt was last seen by Dr. Radford Pax on 10/11 for LE edema. HCTZ was stopped as pt was already taking Lasix, and lisinopril dose was increased from 20mg  to 40mg  daily. Pt was referred to clinic for follow up, and is also pending sleep study to assess for OSA. She's also pending echo on 10/28.  Today, patient presents to clinic and denies symptoms of headache or dizziness. She also reports tolerating her BP medications well. She has been using a new arm cuff and her BP readings have been 140s/80-90s at home. She reports her weight has been stable around 246 lbs at home. She has mild edema in her legs. She reports she has been more active with helping her son move out of his apartment, but she expresses frustration with her ongoing knee pain and pain medications. Patient takes daily ibuprofen 800 mg daily and voltaren gel as Tylenol and gabapentin don't work for her.  Current HTN meds: lisinopril 40mg  daily, furosemide 20 mg daily Previously tried: lisinopril-hctz  BP goal: <130/82mmHg  Family History: The patient's family history includes Breast cancer (age of onset: 5) in her mother; Diabetes in her brother and brother; Hypertension in her father.   Social History:  reports that she quit smoking about 6 years ago. Her smoking use included cigarettes. She has a 0.50 pack-year smoking history. She has never used smokeless tobacco. She reports current alcohol use of about 2.0 standard drinks per week. She reports that she does not use drugs.   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 She has poor mobility due to her knee pain, weight is currently too high for surgery  Wt Readings from Last 3 Encounters:  05/30/21 257 lb 3.2 oz (116.7 kg)  04/25/21 252 lb 6.4 oz (114.5 kg)  02/06/21 252 lb 6.4 oz (114.5 kg)   BP Readings from Last 3 Encounters:  05/30/21 (!) 160/98  04/25/21 (!) 149/83  02/06/21 (!) 149/83   Pulse Readings from Last 3 Encounters:  05/30/21 96  04/25/21 86  02/06/21 86    Renal function: Estimated Creatinine Clearance: 92.4 mL/min (by C-G formula based on SCr of 0.64 mg/dL).  Past Medical History:  Diagnosis Date   Anemia    Asthma    Depression    no meds   Eczema    Fibroid    Headache(784.0)    otc prn med   History of blood transfusion    Hypertension    Knee pain, bilateral    arthralgia knee pain bilateral   Seasonal allergies    SVD (spontaneous vaginal delivery)    x 3   Vaginal polyp 05/2020    Current Outpatient Medications on File Prior to Visit  Medication Sig Dispense Refill   acetaminophen (TYLENOL) 500 MG tablet Take 2 tablets (1,000 mg total) by mouth 2 (two) times daily as needed. 120 tablet 11   albuterol (PROAIR HFA) 108 (90 Base) MCG/ACT inhaler INHALE 1 PUFF EVERY 6 HOURS AS NEEDED FOR SHORTNESS OF BREATH 8.5 g 5   albuterol (VENTOLIN HFA) 108 (90 Base) MCG/ACT inhaler INHALE 1 PUFF EVERY 6 HOURS AS NEEDED FOR SHORTNESS OF BREATH (Patient not taking: Reported on 05/30/2021) 8.5 g 5   albuterol (VENTOLIN HFA) 108 (90 Base) MCG/ACT inhaler INHALE 1 PUFF EVERY 6 HOURS AS NEEDED FOR SHORTNESS OF BREATH (Patient not taking: Reported on 05/30/2021) 8.5 g 0   cetirizine (ZYRTEC) 10 MG tablet Take 1 tablet (10 mg total) by mouth daily. 30 tablet 6   diclofenac (VOLTAREN) 75 MG EC tablet Take 1 tablet (75 mg total) by mouth 2 (two) times daily as needed. (Patient  not taking: Reported on 05/30/2021) 60 tablet 1   diclofenac (VOLTAREN) 75 MG EC tablet TAKE 1 TABLET BY MOUTH TWICE DAILY AS NEEDED 60 tablet 1   diclofenac Sodium (VOLTAREN) 1 % GEL Apply 4 g topically 4 (four) times daily. 100 g 3   docusate sodium (COLACE) 100 MG capsule Take 1 capsule (100 mg total) by mouth 2 (two) times daily as needed. 30 capsule 2   furosemide (LASIX) 20 MG tablet Take 1 tablet (20 mg total) by mouth daily. 30 tablet 6   gabapentin (NEURONTIN) 300 MG capsule TAKE 1 CAPSULE (300 MG TOTAL) BY MOUTH 3 (THREE) TIMES DAILY. (Patient not taking: Reported on 05/30/2021) 90 capsule 6   gabapentin (NEURONTIN) 600 MG tablet Take 1 tablet (600 mg total) by mouth 3 (three) times daily. 90 tablet 1   hydrocortisone 2.5 % cream Apply topically 2 (two) times daily. 30 g 1   hydrOXYzine (ATARAX/VISTARIL) 25 MG tablet Take 1 tablet (25 mg total) by mouth 3 (three) times daily as needed. 30 tablet 11   ibuprofen (ADVIL) 800 MG tablet Take 1 tablet (800 mg total) by mouth 3 (three) times daily. 30 tablet 6   lisinopril (ZESTRIL) 40 MG tablet Take 1 tablet (40 mg total) by mouth daily. 90 tablet 3   montelukast (SINGULAIR) 10 MG tablet TAKE 1 TABLET (10 MG TOTAL) BY MOUTH AT BEDTIME. (Patient not taking: Reported on 05/30/2021) 90 tablet 6   montelukast (SINGULAIR) 10 MG tablet TAKE 1 TABLET (10 MG TOTAL) BY MOUTH AT BEDTIME. 90 tablet 6   NONFORMULARY OR COMPOUNDED ITEM Diclofenac 3%, Gabapentin 5%, Lidocaine 5%, Menthol 1%.  Apply 1-2 pumps three to four times per day as needed 1 each 0   No current facility-administered medications on file prior to visit.    No Known Allergies  Last menstrual period 04/03/2017.   Assessment/Plan:  1. Hypertension - BP is above goal < 130/48mmHg. Will start spironolactone 25 mg daily. Continue lisinopril 40 mg daily and furosemide 20 mg daily. Avoiding amlodipine as pt already has some LE edema, avoiding thiazides since pt already taking loop  diuretic for her edema. Spironolactone should help with both her edema and BP. Will follow up in 3 weeks for BP check and BMET. Can consider adding carvedilol or switching lisinopril to Entresto at future visit pending results of echo scheduled on 10/28.  Advised patient to follow-up with PCP for alternative pain medications as her ibuprofen 800 mg as well as her pain/stress are likely contributing to elevated BP.  Counseled patient to continue limiting salt intake  and to avoid caffeine, as well as continue regular movement at work and physical activity.   Cyd Silence  Pharm D. Candidate  [facility]. [facility], PharmD, BCACP, [place] [address] [place], [place] [address] Phone: [phone]; Fax: [phone] 06/07/2021 1:09 PM

## 2021-06-07 ENCOUNTER — Ambulatory Visit (INDEPENDENT_AMBULATORY_CARE_PROVIDER_SITE_OTHER): Payer: Medicaid Other | Admitting: Pharmacist

## 2021-06-07 ENCOUNTER — Other Ambulatory Visit: Payer: Self-pay

## 2021-06-07 ENCOUNTER — Other Ambulatory Visit: Payer: Medicaid Other | Admitting: *Deleted

## 2021-06-07 VITALS — BP 144/100 | HR 84

## 2021-06-07 DIAGNOSIS — I10 Essential (primary) hypertension: Secondary | ICD-10-CM | POA: Diagnosis not present

## 2021-06-07 LAB — BASIC METABOLIC PANEL
BUN/Creatinine Ratio: 28 — ABNORMAL HIGH (ref 9–23)
BUN: 19 mg/dL (ref 6–24)
CO2: 23 mmol/L (ref 20–29)
Calcium: 10.5 mg/dL — ABNORMAL HIGH (ref 8.7–10.2)
Chloride: 106 mmol/L (ref 96–106)
Creatinine, Ser: 0.69 mg/dL (ref 0.57–1.00)
Glucose: 98 mg/dL (ref 70–99)
Potassium: 4.4 mmol/L (ref 3.5–5.2)
Sodium: 143 mmol/L (ref 134–144)
eGFR: 101 mL/min/{1.73_m2} (ref >59)

## 2021-06-07 MED ORDER — SPIRONOLACTONE 25 MG PO TABS
25.0000 mg | ORAL_TABLET | Freq: Every day | ORAL | 5 refills | Status: DC
  Filled 2021-06-07: qty 30, 30d supply, fill #0

## 2021-06-07 NOTE — Patient Instructions (Addendum)
It was nice to meet you today!  Your blood pressure is above goal of <130/80. Start spironolactone 25 mg daily. Continue lisinopril 40 mg daily and furosemide 20 mg daily.  We will see you at for lab work in 2 weeks on November 9th. Please bring in your blood pressure cuff  and log to your next visit.

## 2021-06-08 ENCOUNTER — Other Ambulatory Visit: Payer: Self-pay

## 2021-06-08 ENCOUNTER — Encounter
Payer: Medicaid Other | Attending: Physical Medicine and Rehabilitation | Admitting: Physical Medicine and Rehabilitation

## 2021-06-08 DIAGNOSIS — M170 Bilateral primary osteoarthritis of knee: Secondary | ICD-10-CM

## 2021-06-08 DIAGNOSIS — R52 Pain, unspecified: Secondary | ICD-10-CM

## 2021-06-08 DIAGNOSIS — I10 Essential (primary) hypertension: Secondary | ICD-10-CM | POA: Diagnosis not present

## 2021-06-08 MED ORDER — IBUPROFEN 800 MG PO TABS
800.0000 mg | ORAL_TABLET | Freq: Three times a day (TID) | ORAL | 6 refills | Status: DC
  Filled 2021-06-08: qty 30, 10d supply, fill #0
  Filled 2021-06-29 – 2021-07-10 (×2): qty 30, 10d supply, fill #1
  Filled 2021-08-07: qty 30, 10d supply, fill #2
  Filled 2021-09-14: qty 90, 30d supply, fill #0
  Filled 2021-11-16: qty 30, 10d supply, fill #1

## 2021-06-08 MED ORDER — CLONIDINE 0.1 MG/24HR TD PTWK
0.1000 mg | MEDICATED_PATCH | TRANSDERMAL | 12 refills | Status: DC
  Filled 2021-06-08 – 2021-09-14 (×2): qty 4, 28d supply, fill #0
  Filled 2021-10-30: qty 4, 28d supply, fill #1
  Filled 2021-12-18: qty 4, 28d supply, fill #2
  Filled 2022-01-16: qty 4, 28d supply, fill #3
  Filled 2022-02-13: qty 4, 28d supply, fill #4
  Filled 2022-04-06: qty 4, 28d supply, fill #5

## 2021-06-08 NOTE — Progress Notes (Signed)
Subjective:    Patient ID: Caroline Ryan, female    DOB: [birth year], 55 y.o.   MRN: [id number]  HPI  An audio/video tele-health visit is felt to be the most appropriate encounter for this patient at this time. This is a follow up tele-visit via phone. The patient is at home. MD is at office.    Caroline Ryan presents for follow-up of bilateral knee pain. We discussed her MRI results today and she would like to pursue arthroscopic debridement if surgeon feels this would be appropriate. Stairs worsen her pain- she uses rails and tries to minimize using the stairs. The other day her legs gave way.   Her pain has been well controlled, 8/10 today.   BP 152/92 in office previously. It is usually 140/70. Diet has ben pretty good but she still struggles to lose weight.   The cream was too expensive for her at [facility]. She has been trying to walk. Her mood is a little more down today. The handicap sticker allows her to go out more.  She has been taking ibuprofen 800mg  and Gabapentin. She has tried Tylenol in the past and it does not help much. She would like to repeat Monovisc next visit in December.   Weight is 253 lbs, down 2 lbs from last visit.   Prior history: She is feeling very fatigued today. I have bene unable to get her into see the OBGYN as they did not accept her insurance.   Prior history:  Caroline Ryan is a 55 year old woman who presents with bilateral knee OA. Last visit we did bilateral knee viscosupplementation injections that has helped her so much.   She was helping her dad move last night.   Her BP is well controlled.   She is taking the Norco about twice per day. This is the only medication that helps to ease her pain.   She hopes to be able to do shopping and spend time with her daughter.   She is 257 lbs, same weight is last time.   She is walking more than last month.   She wants to try a juice diet and help get her weight off. She has been eating  fruits and vegetables.  She has been noting sharp pains shooting down her legs and arms. She felt weak when she had this pain. Her arm buckled when she had this pain.  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   Pain is 9/10 on average, and 7/10 right now.   She was diagnosed with fibroids and is stressed by the fact that she has not heard from the gynecologist yet.   She is also having hot flashes from menopause and asks what kind of treatments are available.   Pain Inventory Average Pain 8 Pain Right Now 8 My pain is sharp, burning and stabbing  In the last 24 hours, has pain interfered with the following? General activity 8 Relation with others 7 Enjoyment of life 9 What TIME of day is your pain at its worst? daytime Sleep (in general) Fair  Pain is worse with: walking and sitting Pain improves with: rest, heat/ice, therapy/exercise, medication, TENS and injections Relief from Meds: 8  Family History  Problem Relation Age of Onset   Hypertension Father    Diabetes Brother    Diabetes Brother    Breast cancer Mother 64   Other Neg Hx    Social History   Socioeconomic History   Marital  status: Single    Spouse name: Not on file   Number of children: Not on file   Years of education: Not on file   Highest education level: Not on file  Occupational History   Not on file  Tobacco Use   Smoking status: Former    Packs/day: 0.25    Years: 2.00    Pack years: 0.50    Types: Cigarettes    Quit date: 08/20/2014    Years since quitting: 6.8   Smokeless tobacco: Never  Vaping Use   Vaping Use: Never used  Substance and Sexual Activity   Alcohol use: Yes    Alcohol/week: 2.0 standard drinks    Types: 2 Glasses of wine per week   Drug use: No   Sexual activity: Yes    Birth control/protection: Condom  Other Topics Concern   Not on file  Social History Narrative   Not on file   Social Determinants of Health   Financial Resource Strain: Not on file  Food  Insecurity: Food Insecurity Present   Worried About [place] in the Last Year: Sometimes true   Ran Out of Food in the Last Year: Sometimes true  Transportation Needs: Unmet Transportation Needs   Lack of Transportation (Medical): Yes   Lack of Transportation (Non-Medical): Yes  Physical Activity: Not on file  Stress: Not on file  Social Connections: Not on file   Past Surgical History:  Procedure Laterality Date   ABDOMINAL HYSTERECTOMY     BILATERAL SALPINGECTOMY Bilateral 10/10/2017   Procedure: BILATERAL SALPINGECTOMY;  Surgeon: Chancy Milroy, MD;  Location: [facility];  Service: Gynecology;  Laterality: Bilateral;   BREAST BIOPSY     US guided core 2009   BREAST EXCISIONAL BIOPSY     right 1982 left [place]     bilateral cysts/benign   VAGINAL HYSTERECTOMY N/A 10/10/2017   Procedure: HYSTERECTOMY VAGINAL;  Surgeon: Chancy Milroy, MD;  Location: [facility];  Service: Gynecology;  Laterality: N/A;   Past Surgical History:  Procedure Laterality Date   ABDOMINAL HYSTERECTOMY     BILATERAL SALPINGECTOMY Bilateral 10/10/2017   Procedure: BILATERAL SALPINGECTOMY;  Surgeon: Chancy Milroy, MD;  Location: [facility];  Service: Gynecology;  Laterality: Bilateral;   BREAST BIOPSY     US guided core 2009   BREAST EXCISIONAL BIOPSY     right 1982 left [place]     bilateral cysts/benign   VAGINAL HYSTERECTOMY N/A 10/10/2017   Procedure: HYSTERECTOMY VAGINAL;  Surgeon: Chancy Milroy, MD;  Location: [facility];  Service: Gynecology;  Laterality: N/A;   Past Medical History:  Diagnosis Date   Anemia    Asthma    Depression    no meds   Eczema    Fibroid    Headache(784.0)    otc prn med   History of blood transfusion    Hypertension    Knee pain, bilateral    arthralgia knee pain bilateral   Seasonal allergies    SVD (spontaneous vaginal delivery)    x 3   Vaginal polyp 05/2020   LMP 04/03/2017 (Approximate)   Opioid Risk Score:   Fall  Risk Score:  `1  Depression screen PHQ 2/9  Depression screen [facility] 2/9 04/25/2021 02/06/2021 08/09/2020 05/20/2020 04/07/2020 03/04/2020 02/16/2020  Decreased Interest 2 1 2  0 1 1 1   Down, Depressed, Hopeless 2 1 2 1 1 1 1   PHQ - 2 Score 4 2 4  1 2 2 2   Altered sleeping - - 2 - - - 0  Tired, decreased energy - - 1 - - - 1  Change in appetite - - 1 - - - 0  Feeling bad or failure about yourself  - - 1 - - - 0  Trouble concentrating - - 0 - - - 0  Moving slowly or fidgety/restless - - 0 - - - 0  Suicidal thoughts - - 0 - - - 0  PHQ-9 Score - - 9 - - - 3  Difficult doing work/chores - - - - - - -  Some recent data might be hidden    Review of Systems  Constitutional: Negative.   HENT: Negative.    Eyes: Negative.   Respiratory: Negative.    Cardiovascular: Negative.   Gastrointestinal: Negative.   Endocrine: Negative.   Genitourinary: Negative.   Musculoskeletal:  Positive for arthralgias and gait problem.  Skin: Negative.   Allergic/Immunologic: Negative.   Hematological: Negative.   Psychiatric/Behavioral: Negative.    All other systems reviewed and are negative.      Objective:   Physical Exam Seen via phone.     Assessment & Plan:  Caroline Ryan is a 55 year old woman presenting to establish care for bilateral chronic knee pain, neck pain.   1) Bilateral knee OA: -XRs reviewed and consistent with OA -Reviewed MRI results which show bilaterals severe osteoarthritis which is worst in the medial compartment, as well as severe bilateral cartilage damage. Discussed that arthroscopic debridement may be an option for her since she has been asked to lose weight prior to TKA and this has been difficult for her.  -She received steroid injections q8months. -She had viscosupplementation injections in July with excellent benefits.  -Vitamin D level normal.  -Prescribed compounding cream as follows: Diclofenac 3%, Gabapentin 5%, Lidocaine 5%, Menthol 1% compounded at [facility]:   apply 1-2 pumps three to four times per day as needed-- this was too expensive for her to purchase. Recommended blue emu oil -Discussed her walking.Made goal to increase to 15 minutes at least 4 times per week.  -Discussed that every pound she loses is 6 lbs off her knees.  -Increase Gabapentin to 600mg  TID. Start at night and don't drive initially after starting higher dose as could make her very sleepy. Educated that Gabapentin can help with her hot flashes.  -continue to work on weight loss -Zilretta bilateral as soon as available.    2) Muscle cramps:magneisum level was normal last visit.   3) Morbid obesity: Weight is 257, back to baseline. Discussed intermittent fasting and low carb diet. Continue healthy diet. Made goal to start eating at 11:15am instead of 11am and to move dinner time from 8:30pm to 8:15pm. HgbA1C 5.2- discussed that there is no prediabetes -referred to bariatric surgery  4) General health -Reviewed to gynecology for pap smear -Reviewed lipids and were normal last year.   5) Radicular symptoms in upper and lower extremities: -no evidence of weakness on exam to suggest myelopathy -Cervical XR results reviewed with her: they show mild spondylosis at C5-6 and C6-7.  No acute fracture. Lumbar XR reviewed with patient and shows 1. No acute compression fracture. 2. A 7 mm anterolisthesis of L4 on L5, presumably degenerative in etiology. 3. Multilevel degenerative disc disease and facet arthrosis. -Patient worried about heart attack- advised that symptoms appear to be more radicular and are not accompanied by chest pain.   6) Palpitations:  -She  has had occasional episodes -Denies chest pain. She gets SOB due to her asthma.   7) Iron deficiency anemia: Iron was very low in 2018- she has a history of menorrhagia and so she could continue to have anemia. Hemoglobin was within normal limits when checked in May. Discussed iron rich foods as she prefers no supplement at  this time.   8) hypertension: Start clonodine patch 0.1mg .   10 minutes spent in discussing her MRI results, plan for referral to orthopedics for arthroscopic debridement evaluation, referral to aquatherapy, refilled ibuprofen, started on clonodine for hypertension

## 2021-06-09 ENCOUNTER — Encounter: Payer: Self-pay | Admitting: Podiatry

## 2021-06-09 ENCOUNTER — Other Ambulatory Visit: Payer: Self-pay

## 2021-06-09 ENCOUNTER — Ambulatory Visit (INDEPENDENT_AMBULATORY_CARE_PROVIDER_SITE_OTHER): Payer: Medicaid Other | Admitting: Podiatry

## 2021-06-09 ENCOUNTER — Ambulatory Visit (INDEPENDENT_AMBULATORY_CARE_PROVIDER_SITE_OTHER): Payer: Medicaid Other

## 2021-06-09 DIAGNOSIS — M722 Plantar fascial fibromatosis: Secondary | ICD-10-CM

## 2021-06-09 DIAGNOSIS — L600 Ingrowing nail: Secondary | ICD-10-CM | POA: Diagnosis not present

## 2021-06-09 MED ORDER — TRIAMCINOLONE ACETONIDE 10 MG/ML IJ SUSP
10.0000 mg | Freq: Once | INTRAMUSCULAR | Status: AC
  Administered 2021-06-09: 10 mg

## 2021-06-12 NOTE — Telephone Encounter (Signed)
Covered/Approved Valid: 06/19/2021 09/17/2021 NOTIFICATION/PRIOR AUTHORIZATION NUMBER  [id number]

## 2021-06-12 NOTE — Progress Notes (Signed)
Subjective:   Patient ID: Caroline Ryan, female   DOB: 55 y.o.   MRN: [id number]   HPI Patient presents with a lot of pain in the right medial arch 6 months nature with worsening with standing and has tried elevation.  Also has several toenails which are thickened and can be irritated for her.  Patient does not smoke likes to be active   Review of Systems  All other systems reviewed and are negative.      Objective:  Physical Exam Vitals and nursing note reviewed.  Constitutional:      Appearance: She is well-developed.  Pulmonary:     Effort: Pulmonary effort is normal.  Musculoskeletal:        General: Normal range of motion.  Skin:    General: Skin is warm.  Neurological:     Mental Status: She is alert.    \Neurovascular status intact muscle strength was found to be adequate range of motion adequate.  Patient is noted to have exquisite discomfort in the mid arch area right with inflammation fluid buildup and pain with palpation and is noted to have thick damage nailbeds first second third right with history of removal of the fourth and fifth right.  Good digital perfusion well oriented     Assessment:  Acute mid arch [place] fasciitis right along with mycotic nail infection right with pain and removal of previous nails     Plan:  H&P reviewed both conditions sterile prep injected the mid arch right 3 mg Kenalog 5 mg Xylocaine and discussed nail removal which can be done at 1 point in future.  Reappoint for [place] to recheck  X-rays indicate that there is no signs of bone pathology appears to be soft tissue currently

## 2021-06-16 ENCOUNTER — Ambulatory Visit (HOSPITAL_COMMUNITY): Payer: Medicaid Other | Attending: Cardiology

## 2021-06-26 ENCOUNTER — Encounter: Payer: Self-pay | Admitting: Orthopaedic Surgery

## 2021-06-26 ENCOUNTER — Ambulatory Visit: Payer: Self-pay

## 2021-06-26 ENCOUNTER — Ambulatory Visit (INDEPENDENT_AMBULATORY_CARE_PROVIDER_SITE_OTHER): Payer: Medicaid Other | Admitting: Orthopaedic Surgery

## 2021-06-26 ENCOUNTER — Other Ambulatory Visit: Payer: Self-pay

## 2021-06-26 VITALS — Ht 60.0 in | Wt 258.0 lb

## 2021-06-26 DIAGNOSIS — G8929 Other chronic pain: Secondary | ICD-10-CM

## 2021-06-26 DIAGNOSIS — M1712 Unilateral primary osteoarthritis, left knee: Secondary | ICD-10-CM | POA: Diagnosis not present

## 2021-06-26 DIAGNOSIS — Z6841 Body Mass Index (BMI) 40.0 and over, adult: Secondary | ICD-10-CM

## 2021-06-26 DIAGNOSIS — M25562 Pain in left knee: Secondary | ICD-10-CM | POA: Diagnosis not present

## 2021-06-26 DIAGNOSIS — M1711 Unilateral primary osteoarthritis, right knee: Secondary | ICD-10-CM | POA: Diagnosis not present

## 2021-06-26 DIAGNOSIS — M25561 Pain in right knee: Secondary | ICD-10-CM

## 2021-06-26 MED ORDER — LIDOCAINE HCL 1 % IJ SOLN
3.0000 mL | INTRAMUSCULAR | Status: AC | PRN
  Administered 2021-06-26: 3 mL

## 2021-06-26 MED ORDER — METHYLPREDNISOLONE ACETATE 40 MG/ML IJ SUSP
40.0000 mg | INTRAMUSCULAR | Status: AC | PRN
  Administered 2021-06-26: 40 mg via INTRA_ARTICULAR

## 2021-06-26 NOTE — Progress Notes (Signed)
Office Visit Note   Patient: Caroline Ryan           Date of Birth: 09-18-1965           MRN: [id number] Visit Date: 06/26/2021              Requested by: [name], MD [id number] [place],  [place] [address] PCP: [name], FNP (Inactive)   Assessment & Plan: Visit Diagnoses:  1. Chronic pain of left knee   2. Chronic pain of right knee   3. Unilateral primary osteoarthritis, left knee   4. Unilateral primary osteoarthritis, right knee   5. BMI 50.0-59.9, adult [facility])     Plan: We continue to advocate weight loss for her given the large soft tissue envelope around both knees combined with her BMI, she is not a surgical candidate unfortunately.  I did place a steroid injection in both knees per her request.  Follow-up is as needed.  Follow-Up Instructions: Return if symptoms worsen or fail to improve.   Orders:  Orders Placed This Encounter  Procedures   Large Joint Inj   Large Joint Inj   XR Knee 1-2 Views Right   XR Knee 1-2 Views Left   No orders of the defined types were placed in this encounter.     Procedures: Large Joint Inj: R knee on 06/26/2021 2:33 PM Indications: diagnostic evaluation and pain Details: 22 G 1.5 in needle, superolateral approach  Arthrogram: No  Medications: 3 mL lidocaine 1 %; 40 mg methylPREDNISolone acetate 40 MG/ML Outcome: tolerated well, no immediate complications Procedure, treatment alternatives, risks and benefits explained, specific risks discussed. Consent was given by the patient. Immediately prior to procedure a time out was called to verify the correct patient, procedure, equipment, support staff and site/side marked as required. Patient was prepped and draped in the usual sterile fashion.    Large Joint Inj: L knee on 06/26/2021 2:33 PM Indications: diagnostic evaluation and pain Details: 22 G 1.5 in needle, superolateral approach  Arthrogram: No  Medications: 3 mL lidocaine 1 %; 40 mg  methylPREDNISolone acetate 40 MG/ML Outcome: tolerated well, no immediate complications Procedure, treatment alternatives, risks and benefits explained, specific risks discussed. Consent was given by the patient. Immediately prior to procedure a time out was called to verify the correct patient, procedure, equipment, support staff and site/side marked as required. Patient was prepped and draped in the usual sterile fashion.      Clinical Data: No additional findings.   Subjective: Chief Complaint  Patient presents with   Right Knee - Pain   Left Knee - Pain  Patient is actually someone who is seen in the past.  She has severe arthritis in both her knees with significant chronic pain in these knees.  Previous x-rays have shown bone-on-bone arthritis of both knees.  She is very uncomfortable sitting for long period time when she gets up to stand.  It is hard to get up and down stairs as well.  Her BMI today is 50.39.  She denies any other acute change in her medical status.  She has had injections in the past with her knees.  HPI  Review of Systems There is currently listed no headache, chest pain, shortness of breath, fever, chills, nausea, vomiting  Objective: Vital Signs: Ht 5' (1.524 m)   Wt 258 lb (117 kg)   LMP 04/03/2017 (Approximate)   BMI 50.39 kg/m   Physical Exam She is alert  and orient x3 and in no acute Ortho Exam Varus examination of both knees shows significant varus malalignment with patellofemoral crepitation and global pain throughout the arc of motion of both knees. Specialty Comments:  No specialty comments available.  Imaging: XR Knee 1-2 Views Left  Result Date: 06/26/2021 2 views left knee show severe end-stage arthritis with varus malalignment and complete bone-on-bone wear and loss of the medial joint space in the patellofemoral joint.  XR Knee 1-2 Views Right  Result Date: 06/26/2021 2 views of the right knee show severe end-stage tricompartment  arthritis with varus malalignment and complete loss of medial joint space in the patellofemoral joint.    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   Unilateral primary osteoarthritis, left knee 10/22/2019   Unilateral primary osteoarthritis, right knee 10/22/2019   BMI 50.0-59.9, adult ([place]) 10/22/2019   Grieving 06/10/2019   Pruritus 06/10/2019   Bilateral lower extremity edema 11/02/2018   Post-operative state 10/10/2017   Symptomatic anemia 05/20/2017   Arthralgia of both knees 06/27/2015   BACK STRAIN, LUMBAR 03/16/2010   KNEE PAIN, BILATERAL 06/30/2009   OBESITY 04/13/2009   Edema 04/13/2009   FIBROADENOMA, BREAST 04/20/2008   HYPERTENSION, BENIGN ESSENTIAL 04/14/2008   Allergic rhinitis 04/14/2008   Asthma 04/14/2008   Past Medical History:  Diagnosis Date   Anemia    Asthma    Depression    no meds   Eczema    Fibroid    Headache(784.0)    otc prn med   History of blood transfusion    Hypertension    Knee pain, bilateral    arthralgia knee pain bilateral   Seasonal allergies    SVD (spontaneous vaginal delivery)    x 3   Vaginal polyp 05/2020    Family History  Problem Relation Age of Onset   Hypertension Father    Diabetes Brother    Diabetes Brother    Breast cancer Mother 56   Other Neg Hx     Past Surgical History:  Procedure Laterality Date   ABDOMINAL HYSTERECTOMY     BILATERAL SALPINGECTOMY Bilateral 10/10/2017   Procedure: BILATERAL SALPINGECTOMY;  Surgeon: Chancy Milroy, MD;  Location: [facility];  Service: Gynecology;  Laterality: Bilateral;   BREAST BIOPSY     US guided core 2009   BREAST EXCISIONAL BIOPSY     right 1982 left [place]     bilateral cysts/benign   VAGINAL HYSTERECTOMY N/A 10/10/2017   Procedure: HYSTERECTOMY VAGINAL;  Surgeon: Chancy Milroy, MD;  Location: [facility];  Service: Gynecology;  Laterality: N/A;   Social History   Occupational History    Not on file  Tobacco Use   Smoking status: Former    Packs/day: 0.25    Years: 2.00    Pack years: 0.50    Types: Cigarettes    Quit date: 08/20/2014    Years since quitting: 6.8   Smokeless tobacco: Never  Vaping Use   Vaping Use: Never used  Substance and Sexual Activity   Alcohol use: Yes    Alcohol/week: 2.0 standard drinks    Types: 2 Glasses of wine per week   Drug use: No   Sexual activity: Yes    Birth control/protection: Condom

## 2021-06-28 ENCOUNTER — Ambulatory Visit: Payer: Medicaid Other

## 2021-06-29 ENCOUNTER — Other Ambulatory Visit: Payer: Self-pay

## 2021-06-29 ENCOUNTER — Ambulatory Visit (HOSPITAL_COMMUNITY): Payer: Medicaid Other | Attending: Cardiology

## 2021-06-29 ENCOUNTER — Other Ambulatory Visit: Payer: Self-pay | Admitting: Nurse Practitioner

## 2021-06-29 ENCOUNTER — Encounter (HOSPITAL_COMMUNITY): Payer: Self-pay | Admitting: Cardiology

## 2021-06-29 DIAGNOSIS — J4520 Mild intermittent asthma, uncomplicated: Secondary | ICD-10-CM

## 2021-07-04 ENCOUNTER — Ambulatory Visit (HOSPITAL_BASED_OUTPATIENT_CLINIC_OR_DEPARTMENT_OTHER): Payer: Medicaid Other | Attending: Physical Medicine and Rehabilitation | Admitting: Physical Therapy

## 2021-07-04 ENCOUNTER — Encounter (HOSPITAL_BASED_OUTPATIENT_CLINIC_OR_DEPARTMENT_OTHER): Payer: Self-pay | Admitting: Physical Therapy

## 2021-07-04 ENCOUNTER — Other Ambulatory Visit: Payer: Self-pay

## 2021-07-04 DIAGNOSIS — M170 Bilateral primary osteoarthritis of knee: Secondary | ICD-10-CM | POA: Insufficient documentation

## 2021-07-04 DIAGNOSIS — M25562 Pain in left knee: Secondary | ICD-10-CM | POA: Diagnosis present

## 2021-07-04 DIAGNOSIS — M25561 Pain in right knee: Secondary | ICD-10-CM | POA: Insufficient documentation

## 2021-07-04 DIAGNOSIS — R2689 Other abnormalities of gait and mobility: Secondary | ICD-10-CM | POA: Diagnosis present

## 2021-07-04 DIAGNOSIS — G8929 Other chronic pain: Secondary | ICD-10-CM | POA: Insufficient documentation

## 2021-07-04 NOTE — Telephone Encounter (Addendum)
Patient is scheduled for lab study on 08/11/21. Patient understands her sleep study will be done at [facility] sleep lab. Patient understands she will receive a sleep packet in a week or so. Patient understands to call if she does not receive the sleep packet in a timely manner. Patient agrees with treatment and thanked me for call.

## 2021-07-04 NOTE — Therapy (Signed)
OUTPATIENT PHYSICAL THERAPY LOWER EXTREMITY EVALUATION   Patient Name: Caroline Ryan MRN: [id number] DOB:May 10, 1966, 55 y.o., female Today's Date: 07/04/2021   PT End of Session - 07/04/21 2131     Visit Number 1    Number of Visits 12    Date for PT Re-Evaluation 08/15/21    PT Start Time 1430    PT Stop Time 1515    PT Time Calculation (min) 45 min    Activity Tolerance Patient tolerated treatment well    Behavior During Therapy [facility] for tasks assessed/performed             Past Medical History:  Diagnosis Date   Anemia    Asthma    Depression    no meds   Eczema    Fibroid    Headache(784.0)    otc prn med   History of blood transfusion    Hypertension    Knee pain, bilateral    arthralgia knee pain bilateral   Seasonal allergies    SVD (spontaneous vaginal delivery)    x 3   Vaginal polyp 05/2020   Past Surgical History:  Procedure Laterality Date   ABDOMINAL HYSTERECTOMY     BILATERAL SALPINGECTOMY Bilateral 10/10/2017   Procedure: BILATERAL SALPINGECTOMY;  Surgeon: Chancy Milroy, MD;  Location: [facility];  Service: Gynecology;  Laterality: Bilateral;   BREAST BIOPSY     US guided core 2009   BREAST EXCISIONAL BIOPSY     right 1982 left [place]     bilateral cysts/benign   VAGINAL HYSTERECTOMY N/A 10/10/2017   Procedure: HYSTERECTOMY VAGINAL;  Surgeon: Chancy Milroy, MD;  Location: [facility];  Service: Gynecology;  Laterality: N/A;   Patient Active Problem List   Diagnosis Date Noted   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/2020   Unilateral primary osteoarthritis, left knee 10/22/2019   Unilateral primary osteoarthritis, right knee 10/22/2019   BMI 50.0-59.9, adult ([place]) 10/22/2019   Grieving 06/10/2019   Pruritus 06/10/2019   Bilateral lower extremity edema 11/02/2018   Post-operative state 10/10/2017   Symptomatic anemia 05/20/2017   Arthralgia of both knees 06/27/2015   BACK STRAIN, LUMBAR 03/16/2010   KNEE PAIN, BILATERAL  06/30/2009   OBESITY 04/13/2009   Edema 04/13/2009   FIBROADENOMA, BREAST 04/20/2008   HYPERTENSION, BENIGN ESSENTIAL 04/14/2008   Allergic rhinitis 04/14/2008   Asthma 04/14/2008    PCP: [name], FNP (Inactive)  REFERRING PROVIDER: [name], MD  REFERRING DIAG: M17.0 (ICD-10-CM) - Bilateral primary osteoarthritis of knee  THERAPY DIAG:  Bilateral knee pain   ONSET DATE: >2 years   SUBJECTIVE:   SUBJECTIVE STATEMENT: Patient has had increased pain in her knees over time. The right knee is worse then the left but she has severe arthritis in both knees. She has had gel injections and cortisone injections. She is taking Iburpofin. She has increased pain when she stands and walks. She has good says and bad days. She used to enjoy walking. She works as a Quarry manager.   PERTINENT HISTORY: Lumbar strain but she reports its mild; Bilateral Knee Pain    PAIN:  Are you having pain? Yes VAS scale: 8/10 Pain location: medial  Pain orientation: Right  PAIN TYPE: aching and burning Pain description: intermittent  Aggravating factors: Standing and walking  Relieving factors: rest, Voltaren  PAIN:  Are you having pain? Yes VAS scale: 6/10 Pain location: left knee  Pain orientation: Left medial  PAIN TYPE: aching Pain description: intermittent  Aggravating factors: standing and walking  Relieving factors: Standing and walking     PRECAUTIONS: None  WEIGHT BEARING RESTRICTIONS No  FALLS:  Has patient fallen in last 6 months? No  LIVING ENVIRONMENT: 3 slights of stairs into the house. Has guardrail.  OCCUPATION: Works as a Quarry manager    PLOF: Independent  PATIENT GOALS  To be able to do her daily activity without as much pain    OBJECTIVE:   DIAGNOSTIC FINDINGS: Severe Knee OA bilateral   PATIENT SURVEYS:  FOTO    COGNITION:  Overall cognitive status: Within functional limits for tasks assessed     SENSATION:  Light touch: Appears intact  Stereognosis:  Appears intact  Hot/Cold: Appears intact  Proprioception: Appears intact    POSTURE:  Rounded shoulders; Bilateral flat foot. Patient has no bought new shoes in some time She has an undert but they are old as well; bilateral knee verus.   LE AROM/PROM:  AROM Right 07/04/2021 Left 07/04/2021  Hip flexion    Hip extension    Hip abduction    Hip adduction    Hip internal rotation    Hip external rotation    Knee flexion Full with mild pain  Full With mild pain   Knee extension    Ankle dorsiflexion    Ankle plantarflexion    Ankle inversion    Ankle eversion     (Blank rows = not tested)  LE MMT:  MMT Right 07/04/2021 Left 07/04/2021  Hip flexion 3/5 3+/5  Hip extension    Hip abduction 4+/5 4+/5  Hip adduction    Hip internal rotation    Hip external rotation    Knee flexion 3+/5 3+/5  Knee extension 3/5 3/5  Ankle dorsiflexion    Ankle plantarflexion    Ankle inversion    Ankle eversion     (Blank rows = not tested)  Patella mobility:  Significant limitations bilateral    GAIT: Significant lateral movement side to side; decreased hip flexion    TODAY'S TREATMENT:   Exercises Supine Quad Set  10 reps - 5sec hold Supine Straight Leg Raises 10 reps Seated Hip Abduction with Resistance 10 reps   PATIENT EDUCATION:  Education details: anatomy of condition; benefits of hip strengthening for knee stability; POC going forward and progression.  Person educated: Patient Education method: Explanation, Demonstration, Tactile cues, Verbal cues, and Handouts Education comprehension: verbalized understanding, returned demonstration, verbal cues required, tactile cues required, and needs further education   HOME EXERCISE PROGRAM: Access Code: [email or web address] URL: [email or web address] Date: 07/04/2021 Prepared by: Carolyne Littles  Exercises Supine Quad Set - 2 x daily - 7 x weekly - 3 sets - 10 reps - 5sec hold Supine Straight Leg Raises - 2 x  daily - 7 x weekly - 3 sets - 10 reps Seated Hip Abduction with Resistance - 2 x daily - 7 x weekly - 3 sets - 10 reps   ASSESSMENT:  CLINICAL IMPRESSION: Patient is a 55 year old female with bilateral knee pain. Per her X-rays she has severe OA in both knees. She would like to avoid surgery. If she does have to have surgery she will have to loose weight first. At this time she is having difficulty loosing weight 2nd to difficulty ambulating distances and exercises. She is very motivated to get into shape. She would benefit from skilled therapy for aquatic therapy and a progressive strength and stability program that she can continue over time.    Objective impairments  include Abnormal gait, decreased activity tolerance, decreased balance, decreased endurance, decreased mobility, difficulty walking, decreased ROM, decreased strength, and pain. These impairments are limiting patient from cleaning, community activity, occupation, shopping, and yard work. Personal factors including 3+ comorbidities: severe OA; bilateral flat foot; obesity; low back pain   are also affecting patient's functional outcome. Patient will benefit from skilled PT to address above impairments and improve overall function.  REHAB POTENTIAL: Good Patient very motivated   CLINICAL DECISION MAKING: Evolving/moderate complexity  EVALUATION COMPLEXITY: Moderate   GOALS: Goals reviewed with patient? Yes  SHORT TERM GOALS:  STG Name Target Date Goal status  1 Patient will demonstrate proper technique with bilateral patella mobilization  Baseline:  07/25/2021 INITIAL  2 Patient will increase bilateral hip flexion to 4/5  Baseline:  07/25/2021 INITIAL  3 Patient will be independent with basic HEP for home and in the pool  Baseline: 07/25/2021 INITIAL  LONG TERM GOALS:   LTG Name Target Date Goal status  1 Patient will ambulate 1/2 mile without increased pain in order to walk for exercises  Baseline: 08/15/2021 INITIAL  2  Patient will stand for 1/2 hour without increased pain in order to perform ADL's   Baseline 08/15/2021 INITIAL  3 Patient will demonstrate adherence to complete HEP that promotes weight loss and strengthening Baseline: 08/15/2021 INITIAL  PLAN: PT FREQUENCY: 1-2x/week  PT DURATION: 6 weeks  PLANNED INTERVENTIONS: Therapeutic exercises, Therapeutic activity, Neuro Muscular re-education, Balance training, Gait training, Patient/Family education, Joint mobilization, Stair training, Aquatic Therapy, Electrical stimulation, Cryotherapy, Moist heat, Taping, Ultrasound, Ionotophoresis 4mg /ml Dexamethasone, and Manual therapy  PLAN FOR NEXT SESSION: review HEP; begin cardio training; consider intervals on nu-step; in the pool ork on standing hip 4 way; on land also add standing exercises; review hamstring, quad, and gastroc stretching;    Carney Living PT DPT 07/04/2021, 9:34 PM

## 2021-07-06 ENCOUNTER — Other Ambulatory Visit: Payer: Self-pay

## 2021-07-10 ENCOUNTER — Ambulatory Visit (INDEPENDENT_AMBULATORY_CARE_PROVIDER_SITE_OTHER): Payer: Medicaid Other | Admitting: Pharmacist

## 2021-07-10 ENCOUNTER — Other Ambulatory Visit: Payer: Self-pay

## 2021-07-10 ENCOUNTER — Other Ambulatory Visit: Payer: Self-pay | Admitting: Family Medicine

## 2021-07-10 VITALS — BP 146/100 | HR 87 | Wt 244.0 lb

## 2021-07-10 DIAGNOSIS — I10 Essential (primary) hypertension: Secondary | ICD-10-CM | POA: Diagnosis not present

## 2021-07-10 NOTE — Patient Instructions (Addendum)
Your blood pressure goal is < 130/57mmHg  We'll check your labs today and if they're stable, will plan to increase your spironolactone dose. I'll call you tomorrow to discuss  Continue taking your other medications  Increase walking as able  Try to use a lower dose of ibuprofen if able  Bring in your home blood pressure cuff and readings to your next visit. I'll schedule this when we discuss your lab results and finalize your med plan tomorrow

## 2021-07-10 NOTE — Progress Notes (Signed)
Patient ID: Caroline Ryan [facility]                 DOB: Apr 01, 1966                      MRN: [id number]     HPI: Caroline Ryan is a 55 y.o. female referred by Dr. Mayford Knife to HTN clinic. PMH is significant for asthma, HTN, obesity, and bilateral knee pain. Pt was last seen by Dr. Mayford Knife on 10/11 for LE edema. HCTZ was stopped as pt was already taking Lasix, and lisinopril dose was increased from 20mg  to 40mg  daily due to elevated BP of 160/98. Pt is pending sleep study and echo, although she missed her echo scheduled for 11/10 and it has not been rescheduled yet. At last visit with me on 10/19, BP remained elevated at 144/100 and she was started on spironolactone 25mg  daily (avoided amlodipine since pt already reported LE edema, avoided thiazide since pt already on loop diuretic for her edema). She had a virtual visit with sports med the next day, BP was not assessed, she was started on a clonidine patch.  Pt presents today for follow up. Reports tolerating medications well. 1 episode of dizziness yesterday when she climbed the stairs too quickly. Thinks swelling in her legs is a bit better. Pain level controlled better today, took her BP meds and 1 ibuprofen so far today. Started PT last week and is starting water therapy soon. Forgot to bring in home log of BP readings but has been checking them. Reports lowest BP reading of 139/60, highest of 150 systolic. Takes daily ibuprofen 800 mg 1-3 tablets daily, Norco BID, and voltaren gel as Tylenol and gabapentin don't work for osteoarthritis in her knees.  Current HTN meds: lisinopril 40mg  daily, spironolactone 25mg  daily, clonidine patch 0.1mg  weekly, furosemide 20 mg daily Previously tried: lisinopril-hctz  BP goal: <130/83mmHg  Family History: The patient's family history includes Breast cancer (age of onset: 46) in her mother; Diabetes in her brother and brother; Hypertension in her father.   Social History:  reports that she quit smoking about 6  years ago. Her smoking use included cigarettes. She has a 0.50 pack-year smoking history. She has never used smokeless tobacco. She reports current alcohol use of about 2.0 standard drinks per week. She reports that she does not use drugs.   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 She has poor mobility due to her knee pain, weight is currently too high for surgery  Wt Readings from Last 3 Encounters:  06/26/21 258 lb (117 kg)  05/30/21 257 lb 3.2 oz (116.7 kg)  04/25/21 252 lb 6.4 oz (114.5 kg)   BP Readings from Last 3 Encounters:  06/07/21 (!) 144/100  05/30/21 (!) 160/98  04/25/21 (!) 149/83   Pulse Readings from Last 3 Encounters:  06/07/21 84  05/30/21 96  04/25/21 86    Renal function: CrCl cannot be calculated (Patient's most recent lab result is older than the maximum 21 days allowed.).  Past Medical History:  Diagnosis Date   Anemia    Asthma  Depression    no meds   Eczema    Fibroid    Headache(784.0)    otc prn med   History of blood transfusion    Hypertension    Knee pain, bilateral    arthralgia knee pain bilateral   Seasonal allergies    SVD (spontaneous vaginal delivery)    x 3   Vaginal polyp 05/2020    Current Outpatient Medications on File Prior to Visit  Medication Sig Dispense Refill   acetaminophen (TYLENOL) 500 MG tablet Take 2 tablets (1,000 mg total) by mouth 2 (two) times daily as needed. 120 tablet 11   albuterol (PROAIR HFA) 108 (90 Base) MCG/ACT inhaler INHALE 1 PUFF EVERY 6 HOURS AS NEEDED FOR SHORTNESS OF BREATH 8.5 g 5   cetirizine (ZYRTEC) 10 MG tablet Take 1 tablet (10 mg total) by mouth  daily. 30 tablet 6   cloNIDine (CATAPRES - DOSED IN MG/24 HR) 0.1 mg/24hr patch Place 1 patch (0.1 mg total) onto the skin once a week. 4 patch 12   diclofenac Sodium (VOLTAREN) 1 % GEL Apply 4 g topically 4 (four) times daily. 100 g 3   docusate sodium (COLACE) 100 MG capsule Take 1 capsule (100 mg total) by mouth 2 (two) times daily as needed. 30 capsule 2   furosemide (LASIX) 20 MG tablet Take 1 tablet (20 mg total) by mouth daily. 30 tablet 6   gabapentin (NEURONTIN) 300 MG capsule TAKE 1 CAPSULE (300 MG TOTAL) BY MOUTH 3 (THREE) TIMES DAILY. 90 capsule 6   hydrocortisone 2.5 % cream Apply topically 2 (two) times daily. 30 g 1   hydrOXYzine (ATARAX/VISTARIL) 25 MG tablet Take 1 tablet (25 mg total) by mouth 3 (three) times daily as needed. 30 tablet 11   ibuprofen (ADVIL) 800 MG tablet Take 1 tablet (800 mg total) by mouth 3 (three) times daily. 30 tablet 6   lisinopril (ZESTRIL) 40 MG tablet Take 1 tablet (40 mg total) by mouth daily. 90 tablet 3   montelukast (SINGULAIR) 10 MG tablet TAKE 1 TABLET (10 MG TOTAL) BY MOUTH AT BEDTIME. 90 tablet 6   NONFORMULARY OR COMPOUNDED ITEM Diclofenac 3%, Gabapentin 5%, Lidocaine 5%, Menthol 1%.  Apply 1-2 pumps three to four times per day as needed 1 each 0   spironolactone (ALDACTONE) 25 MG tablet Take 1 tablet (25 mg total) by mouth daily. 30 tablet 5   No current facility-administered medications on file prior to visit.    No Known Allergies  Last menstrual period 04/03/2017.   Assessment/Plan:  1. Hypertension - BP remains elevated above goal < 130/75mmHg and is unchanged despite addition of spironolactone 25mg  last month as well as clonidine patch that her sports med MD started after a virtual visit the day after I started pt on spironolactone. Checking BMET today with spironolactone start. If labs are stable, will increase to 50mg  daily. Continue lisinopril 40 mg daily, furosemide 20 mg daily, ok to continue clonidine for now although not  preferred BP med. Avoiding amlodipine as pt already has some LE edema, avoiding thiazides since pt already taking loop diuretic for her edema. Pt to reschedule her echo as well since she missed this on 11/10. Can consider adding carvedilol or switching lisinopril to Entresto at future visit pending results of echo. Advised pt to continue limiting salt intake and to avoid caffeine, as well as continue regular movement at work and physical activity. Hopefully PT and water therapy will help BP too. Will schedule f/u  appt once labs result and med plan is finalized. Pt advised to bring home cuff and readings to next visit.   Jaizon Deroos E. Teyon Odette, PharmD, BCACP, CPP [facility] [address] [place], [place] [address] Phone: [phone]; Fax: [phone] 07/10/2021 8:09 AM

## 2021-07-11 ENCOUNTER — Other Ambulatory Visit: Payer: Self-pay

## 2021-07-11 ENCOUNTER — Telehealth: Payer: Self-pay | Admitting: Pharmacist

## 2021-07-11 LAB — BASIC METABOLIC PANEL
BUN/Creatinine Ratio: 23 (ref 9–23)
BUN: 18 mg/dL (ref 6–24)
CO2: 26 mmol/L (ref 20–29)
Calcium: 10.7 mg/dL — ABNORMAL HIGH (ref 8.7–10.2)
Chloride: 102 mmol/L (ref 96–106)
Creatinine, Ser: 0.77 mg/dL (ref 0.57–1.00)
Glucose: 98 mg/dL (ref 70–99)
Potassium: 4.2 mmol/L (ref 3.5–5.2)
Sodium: 140 mmol/L (ref 134–144)
eGFR: 90 mL/min/{1.73_m2} (ref >59)

## 2021-07-11 MED ORDER — SPIRONOLACTONE 50 MG PO TABS
50.0000 mg | ORAL_TABLET | Freq: Every day | ORAL | 5 refills | Status: DC
  Filled 2021-07-11: qty 30, 30d supply, fill #0
  Filled 2021-08-01 – 2021-12-18 (×3): qty 90, 90d supply, fill #0

## 2021-07-11 NOTE — Telephone Encounter (Signed)
BMET stable. Pt aware to increase spironolactone from 25mg  to 50mg  daily. F/u BP check scheduled on 12/15 same day as her echo, will need BMET that day as well.

## 2021-07-18 ENCOUNTER — Other Ambulatory Visit: Payer: Self-pay

## 2021-07-21 ENCOUNTER — Other Ambulatory Visit: Payer: Self-pay

## 2021-07-21 ENCOUNTER — Encounter (HOSPITAL_BASED_OUTPATIENT_CLINIC_OR_DEPARTMENT_OTHER): Payer: Self-pay | Admitting: Physical Therapy

## 2021-07-21 ENCOUNTER — Ambulatory Visit (HOSPITAL_BASED_OUTPATIENT_CLINIC_OR_DEPARTMENT_OTHER): Payer: Medicaid Other | Attending: Physical Medicine and Rehabilitation | Admitting: Physical Therapy

## 2021-07-21 DIAGNOSIS — M25561 Pain in right knee: Secondary | ICD-10-CM | POA: Diagnosis present

## 2021-07-21 DIAGNOSIS — G8929 Other chronic pain: Secondary | ICD-10-CM | POA: Diagnosis present

## 2021-07-21 DIAGNOSIS — R2689 Other abnormalities of gait and mobility: Secondary | ICD-10-CM | POA: Insufficient documentation

## 2021-07-21 DIAGNOSIS — M25562 Pain in left knee: Secondary | ICD-10-CM | POA: Insufficient documentation

## 2021-07-21 NOTE — Therapy (Signed)
OUTPATIENT PHYSICAL THERAPY TREATMENT NOTE   Patient Name: Caroline Ryan MRN: [id number] DOB:1966/05/06, 55 y.o., female Today's Date: 07/21/2021  PCP: [name], FNP (Inactive) REFERRING PROVIDER: [name], MD   PT End of Session - 07/21/21 1022     Visit Number 2    Number of Visits 12    Date for PT Re-Evaluation 08/15/21    PT Start Time 0945   Patient late thne had to get chnaged   PT Stop Time 1015    PT Time Calculation (min) 30 min    Activity Tolerance Patient tolerated treatment well    Behavior During Therapy [facility] for tasks assessed/performed             Past Medical History:  Diagnosis Date   Anemia    Asthma    Depression    no meds   Eczema    Fibroid    Headache(784.0)    otc prn med   History of blood transfusion    Hypertension    Knee pain, bilateral    arthralgia knee pain bilateral   Seasonal allergies    SVD (spontaneous vaginal delivery)    x 3   Vaginal polyp 05/2020   Past Surgical History:  Procedure Laterality Date   ABDOMINAL HYSTERECTOMY     BILATERAL SALPINGECTOMY Bilateral 10/10/2017   Procedure: BILATERAL SALPINGECTOMY;  Surgeon: Chancy Milroy, MD;  Location: [facility];  Service: Gynecology;  Laterality: Bilateral;   BREAST BIOPSY     US guided core 2009   BREAST EXCISIONAL BIOPSY     right 1982 left [place]     bilateral cysts/benign   VAGINAL HYSTERECTOMY N/A 10/10/2017   Procedure: HYSTERECTOMY VAGINAL;  Surgeon: Chancy Milroy, MD;  Location: [facility];  Service: Gynecology;  Laterality: N/A;   Patient Active Problem List   Diagnosis Date Noted   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/2020   Unilateral primary osteoarthritis, left knee 10/22/2019   Unilateral primary osteoarthritis, right knee 10/22/2019   BMI 50.0-59.9, adult ([place]) 10/22/2019   Grieving 06/10/2019   Pruritus 06/10/2019   Bilateral lower extremity edema 11/02/2018   Post-operative state 10/10/2017   Symptomatic anemia  05/20/2017   Arthralgia of both knees 06/27/2015   BACK STRAIN, LUMBAR 03/16/2010   KNEE PAIN, BILATERAL 06/30/2009   OBESITY 04/13/2009   Edema 04/13/2009   FIBROADENOMA, BREAST 04/20/2008   HYPERTENSION, BENIGN ESSENTIAL 04/14/2008   Allergic rhinitis 04/14/2008   Asthma 04/14/2008     PCP: [name], FNP (Inactive)   REFERRING PROVIDER: [name], MD   REFERRING DIAG: M17.0 (ICD-10-CM) - Bilateral primary osteoarthritis of knee   THERAPY DIAG:  Bilateral knee pain    ONSET DATE: >2 years    SUBJECTIVE:    SUBJECTIVE STATEMENT: Patient reports her knees are stiff this morning. She is in just minor pain. She has been ttrying to work on her exercises. Transportation picked her up late and dropped her off late.   PERTINENT HISTORY: Lumbar strain but she reports its mild; Bilateral Knee Pain             PAIN:  Are you having pain? Yes VAS scale: 4/10 Pain location: medial  Pain orientation: Right  PAIN TYPE: aching and burning Pain description: intermittent  Aggravating factors: Standing and walking  Relieving factors: rest, Voltaren   PAIN:  Are you having pain? Yes VAS scale: 4/10 Pain location: left knee  Pain orientation: Left medial  PAIN  TYPE: aching Pain description: intermittent  Aggravating factors: standing and walking  Relieving factors: Standing and walking      PRECAUTIONS: None   WEIGHT BEARING RESTRICTIONS No   FALLS:  Has patient fallen in last 6 months? No   LIVING ENVIRONMENT: 3 slights of stairs into the house. Has guardrail.  OCCUPATION: Works as a Quarry manager      PLOF: Independent   PATIENT GOALS  To be able to do her daily activity without as much pain      OBJECTIVE:    DIAGNOSTIC FINDINGS: Severe Knee OA bilateral    PATIENT SURVEYS:  FOTO     COGNITION:          Overall cognitive status: Within functional limits for tasks assessed                        SENSATION:          Light touch: Appears intact           Stereognosis: Appears intact          Hot/Cold: Appears intact          Proprioception: Appears intact       POSTURE:  Rounded shoulders; Bilateral flat foot. Patient has no bought new shoes in some time She has an undert but they are old as well; bilateral knee verus.      TODAY'S TREATMENT: 12/2    Pt seen for aquatic therapy today.  Treatment took place in water 3.25-4 ft in depth at the [facility] pool. Temp of water was 91.  Pt entered/exited the pool via stairs (step through pattern) independently with bilat rail.  Introduction to water. Had patient stand at different levels so she could feel the bouncy   Warm up: heel/toe walking x4 laps across pool chest deep side stepping x4 laps from shallow to deep; Log strides 2 laps; marching 2 laps; lateral walking 2x   Exercises;hip 3 way x20; standing weight shift x20   Seated LAQ 2x20 each leg  Seaetdmarch 2x20      Pt requires buoyancy for support and to offload joints with strengthening exercises. Viscosity of the water is needed for resistance of strengthening; water current perturbations provides challenge to standing balance unsupported, requiring increased core activation. Eval  Exercises Supine Quad Set  10 reps - 5sec hold Supine Straight Leg Raises 10 reps Seated Hip Abduction with Resistance 10 reps     PATIENT EDUCATION:  Education details: anatomy of condition; benefits of hip strengthening for knee stability; POC going forward and progression.  Person educated: Patient Education method: Explanation, Demonstration, Tactile cues, Verbal cues, and Handouts Education comprehension: verbalized understanding, returned demonstration, verbal cues required, tactile cues required, and needs further education     HOME EXERCISE PROGRAM: Access Code: [email or web address] URL: [email or web address] Date: 07/04/2021 Prepared by: Carolyne Littles   Exercises Supine Quad Set - 2 x daily - 7 x weekly - 3  sets - 10 reps - 5sec hold Supine Straight Leg Raises - 2 x daily - 7 x weekly - 3 sets - 10 reps Seated Hip Abduction with Resistance - 2 x daily - 7 x weekly - 3 sets - 10 reps     ASSESSMENT:   CLINICAL IMPRESSION: Patient is a 55 year old female with bilateral knee pain. Per her X-rays she has severe OA in both knees. She would like to avoid surgery. If she does have to have surgery she will have  to loose weight first. At this time she is having difficulty loosing weight 2nd to difficulty ambulating distances and exercises. She is very motivated to get into shape. She would benefit from skilled therapy for aquatic therapy and a progressive strength and stability program that she can continue over time.      Objective impairments include Abnormal gait, decreased activity tolerance, decreased balance, decreased endurance, decreased mobility, difficulty walking, decreased ROM, decreased strength, and pain. These impairments are limiting patient from cleaning, community activity, occupation, shopping, and yard work. Personal factors including 3+ comorbidities: severe OA; bilateral flat foot; obesity; low back pain   are also affecting patient's functional outcome. Patient will benefit from skilled PT to address above impairments and improve overall function.   REHAB POTENTIAL: Good Patient very motivated    CLINICAL DECISION MAKING: Evolving/moderate complexity   EVALUATION COMPLEXITY: Moderate     GOALS: Goals reviewed with patient? Yes   SHORT TERM GOALS:   STG Name Target Date Goal status  1 Patient will demonstrate proper technique with bilateral patella mobilization  Baseline:  07/25/2021 INITIAL  2 Patient will increase bilateral hip flexion to 4/5  Baseline:  07/25/2021 INITIAL  3 Patient will be independent with basic HEP for home and in the pool  Baseline: 07/25/2021 INITIAL  LONG TERM GOALS:    LTG Name Target Date Goal status  1 Patient will ambulate 1/2 mile without  increased pain in order to walk for exercises  Baseline: 08/15/2021 INITIAL  2 Patient will stand for 1/2 hour without increased pain in order to perform ADL's   Baseline 08/15/2021 INITIAL  3 Patient will demonstrate adherence to complete HEP that promotes weight loss and strengthening Baseline: 08/15/2021 INITIAL  PLAN: PT FREQUENCY: 1-2x/week   PT DURATION: 6 weeks   PLANNED INTERVENTIONS: Therapeutic exercises, Therapeutic activity, Neuro Muscular re-education, Balance training, Gait training, Patient/Family education, Joint mobilization, Stair training, Aquatic Therapy, Electrical stimulation, Cryotherapy, Moist heat, Taping, Ultrasound, Ionotophoresis 4mg /ml Dexamethasone, and Manual therapy   PLAN FOR NEXT SESSION: review HEP; begin cardio training; consider intervals on nu-step; in the pool ork on standing hip 4 way; on land also add standing exercises; review hamstring, quad, and gastroc stretching;    Carney Living 07/21/2021, 1:52 PM

## 2021-07-26 ENCOUNTER — Other Ambulatory Visit: Payer: Self-pay

## 2021-07-26 ENCOUNTER — Ambulatory Visit (HOSPITAL_BASED_OUTPATIENT_CLINIC_OR_DEPARTMENT_OTHER): Payer: Medicaid Other | Admitting: Physical Therapy

## 2021-07-26 ENCOUNTER — Encounter (HOSPITAL_BASED_OUTPATIENT_CLINIC_OR_DEPARTMENT_OTHER): Payer: Self-pay | Admitting: Physical Therapy

## 2021-07-26 DIAGNOSIS — M25561 Pain in right knee: Secondary | ICD-10-CM | POA: Diagnosis not present

## 2021-07-26 DIAGNOSIS — G8929 Other chronic pain: Secondary | ICD-10-CM

## 2021-07-26 DIAGNOSIS — R2689 Other abnormalities of gait and mobility: Secondary | ICD-10-CM

## 2021-07-26 NOTE — Therapy (Signed)
OUTPATIENT PHYSICAL THERAPY TREATMENT NOTE   Patient Name: Caroline Ryan MRN: [id number] DOB:05-11-1966, 55 y.o., female Today's Date: 07/26/2021  PCP: [name], FNP REFERRING PROVIDER: [name], MD   PT End of Session - 07/26/21 1202     Visit Number 3    Number of Visits 12    Date for PT Re-Evaluation 08/15/21    PT Start Time 1100    PT Stop Time 1143    PT Time Calculation (min) 43 min    Activity Tolerance Patient tolerated treatment well    Behavior During Therapy [facility] for tasks assessed/performed             Past Medical History:  Diagnosis Date   Anemia    Asthma    Depression    no meds   Eczema    Fibroid    Headache(784.0)    otc prn med   History of blood transfusion    Hypertension    Knee pain, bilateral    arthralgia knee pain bilateral   Seasonal allergies    SVD (spontaneous vaginal delivery)    x 3   Vaginal polyp 05/2020   Past Surgical History:  Procedure Laterality Date   ABDOMINAL HYSTERECTOMY     BILATERAL SALPINGECTOMY Bilateral 10/10/2017   Procedure: BILATERAL SALPINGECTOMY;  Surgeon: Chancy Milroy, MD;  Location: [facility];  Service: Gynecology;  Laterality: Bilateral;   BREAST BIOPSY     US guided core 2009   BREAST EXCISIONAL BIOPSY     right 1982 left [place]     bilateral cysts/benign   VAGINAL HYSTERECTOMY N/A 10/10/2017   Procedure: HYSTERECTOMY VAGINAL;  Surgeon: Chancy Milroy, MD;  Location: [facility];  Service: Gynecology;  Laterality: N/A;   Patient Active Problem List   Diagnosis Date Noted   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/2020   Unilateral primary osteoarthritis, left knee 10/22/2019   Unilateral primary osteoarthritis, right knee 10/22/2019   BMI 50.0-59.9, adult ([place]) 10/22/2019   Grieving 06/10/2019   Pruritus 06/10/2019   Bilateral lower extremity edema 11/02/2018   Post-operative state 10/10/2017   Symptomatic anemia 05/20/2017   Arthralgia of both knees 06/27/2015    BACK STRAIN, LUMBAR 03/16/2010   KNEE PAIN, BILATERAL 06/30/2009   OBESITY 04/13/2009   Edema 04/13/2009   FIBROADENOMA, BREAST 04/20/2008   HYPERTENSION, BENIGN ESSENTIAL 04/14/2008   Allergic rhinitis 04/14/2008   Asthma 04/14/2008   PCP: [name], FNP (Inactive)   REFERRING PROVIDER: [name], MD   REFERRING DIAG: M17.0 (ICD-10-CM) - Bilateral primary osteoarthritis of knee   THERAPY DIAG:  Bilateral knee pain    ONSET DATE: >2 years    SUBJECTIVE:    SUBJECTIVE STATEMENT: Patient reports both her knees have been sore. She has been doing a lot of stutff.   PerTINENT HISTORY:  Lumbar strain but she reports its mild; Bilateral Knee Pain             PAIN:  Are you having pain? Yes VAS scale: 7/10 Pain location: medial  Pain orientation: Right  PAIN TYPE: aching and burning Pain description: intermittent  Aggravating factors: Standing and walking  Relieving factors: rest, Voltaren   PAIN:  Are you having pain? Yes VAS scale: 7/10 Pain location: left knee  Pain orientation: Left medial  PAIN TYPE: aching Pain description: intermittent  Aggravating factors: standing and walking  Relieving factors: Standing and walking      PRECAUTIONS: None   WEIGHT  BEARING RESTRICTIONS No   FALLS:  Has patient fallen in last 6 months? No   LIVING ENVIRONMENT: 3 slights of stairs into the house. Has guardrail.  OCCUPATION: Works as a Quarry manager      PLOF: Independent   PATIENT GOALS  To be able to do her daily activity without as much pain      OBJECTIVE:    DIAGNOSTIC FINDINGS: Severe Knee OA bilateral    PATIENT SURVEYS:  FOTO     COGNITION:          Overall cognitive status: Within functional limits for tasks assessed                        SENSATION:          Light touch: Appears intact          Stereognosis: Appears intact          Hot/Cold: Appears intact          Proprioception: Appears intact       POSTURE:  Rounded shoulders;  Bilateral flat foot. Patient has no bought new shoes in some time She has an undert but they are old as well; bilateral knee verus.      TODAY'S TREATMENT: 12/7 Supine LTR x20  Supine piriformis stretch 2x30 sec hold with education on how to progress.   Supine march 2x10 with cuing on how to progress  Ball squeeze 2x10 with core breathing  Hip abduction 2x10 red  Quad set 2x15 bilateral with 3-4 sec hold   Reviewed inflammatory foods to avoid at home and anti-inflammatory foods.   12/2    Pt seen for aquatic therapy today.  Treatment took place in water 3.25-4 ft in depth at the [facility] pool. Temp of water was 91.  Pt entered/exited the pool via stairs (step through pattern) independently with bilat rail.   Introduction to water. Had patient stand at different levels so she could feel the bouncy    Warm up: heel/toe walking x4 laps across pool chest deep side stepping x4 laps from shallow to deep; Log strides 2 laps; marching 2 laps; lateral walking 2x     Exercises;hip 3 way x20; standing weight shift x20    Seated LAQ 2x20 each leg  Seaetdmarch 2x20          Pt requires buoyancy for support and to offload joints with strengthening exercises. Viscosity of the water is needed for resistance of strengthening; water current perturbations provides challenge to standing balance unsupported, requiring increased core activation. Eval   Exercises Supine Quad Set  10 reps - 5sec hold Supine Straight Leg Raises 10 reps Seated Hip Abduction with Resistance 10 reps     PATIENT EDUCATION:  Education details: anatomy of condition; benefits of hip strengthening for knee stability; POC going forward and progression.  Person educated: Patient Education method: Explanation, Demonstration, Tactile cues, Verbal cues, and Handouts Education comprehension: verbalized understanding, returned demonstration, verbal cues required, tactile cues required, and needs further education      HOME EXERCISE PROGRAM: Access Code: [email or web address] URL: [email or web address] Date: 07/04/2021 Prepared by: Carolyne Littles   Exercises Supine Quad Set - 2 x daily - 7 x weekly - 3 sets - 10 reps - 5sec hold Supine Straight Leg Raises - 2 x daily - 7 x weekly - 3 sets - 10 reps Seated Hip Abduction with Resistance - 2 x daily - 7 x weekly - 3 sets - 10 reps  ASSESSMENT:   CLINICAL IMPRESSION: Therapy updated her HEP. She was given a supine series of exercises. She was also given light stretching to work on. She tolerated well.  She required some cuing for the abdominal breathing. She will require some practice to get that down. She also tried the nu-step. She did well. She felt like the quad sets helped her knee swelling. She was encouraged to continue with her exercises at home. Therapy als reviewed infalmatory and anti-inflammatory foods for home.    Objective impairments include Abnormal gait, decreased activity tolerance, decreased balance, decreased endurance, decreased mobility, difficulty walking, decreased ROM, decreased strength, and pain. These impairments are limiting patient from cleaning, community activity, occupation, shopping, and yard work. Personal factors including 3+ comorbidities: severe OA; bilateral flat foot; obesity; low back pain   are also affecting patient's functional outcome. Patient will benefit from skilled PT to address above impairments and improve overall function.   REHAB POTENTIAL: Good Patient very motivated    CLINICAL DECISION MAKING: Evolving/moderate complexity   EVALUATION COMPLEXITY: Moderate     GOALS: Goals reviewed with patient? Yes   SHORT TERM GOALS:   STG Name Target Date Goal status  1 Patient will demonstrate proper technique with bilateral patella mobilization  Baseline:  07/25/2021 INITIAL  2 Patient will increase bilateral hip flexion to 4/5  Baseline:  07/25/2021 INITIAL  3 Patient will be independent with basic  HEP for home and in the pool  Baseline: 07/25/2021 INITIAL  LONG TERM GOALS:    LTG Name Target Date Goal status  1 Patient will ambulate 1/2 mile without increased pain in order to walk for exercises  Baseline: 08/15/2021 INITIAL  2 Patient will stand for 1/2 hour without increased pain in order to perform ADL's   Baseline 08/15/2021 INITIAL  3 Patient will demonstrate adherence to complete HEP that promotes weight loss and strengthening Baseline: 08/15/2021 INITIAL  PLAN: PT FREQUENCY: 1-2x/week   PT DURATION: 6 weeks   PLANNED INTERVENTIONS: Therapeutic exercises, Therapeutic activity, Neuro Muscular re-education, Balance training, Gait training, Patient/Family education, Joint mobilization, Stair training, Aquatic Therapy, Electrical stimulation, Cryotherapy, Moist heat, Taping, Ultrasound, Ionotophoresis 4mg /ml Dexamethasone, and Manual therapy   PLAN FOR NEXT SESSION: review HEP; begin cardio training; consider intervals on nu-step; in the pool ork on standing hip 4 way; on land also add standing exercises; review hamstring, quad, and gastroc stretching;    Carney Living PT DPT  07/26/2021, 12:04 PM

## 2021-08-01 ENCOUNTER — Other Ambulatory Visit: Payer: Self-pay

## 2021-08-01 ENCOUNTER — Other Ambulatory Visit: Payer: Self-pay | Admitting: Nurse Practitioner

## 2021-08-01 MED FILL — Montelukast Sodium Tab 10 MG (Base Equiv): ORAL | 90 days supply | Qty: 90 | Fill #0 | Status: AC

## 2021-08-02 ENCOUNTER — Ambulatory Visit (HOSPITAL_BASED_OUTPATIENT_CLINIC_OR_DEPARTMENT_OTHER): Payer: Medicaid Other | Admitting: Physical Therapy

## 2021-08-02 ENCOUNTER — Encounter (HOSPITAL_BASED_OUTPATIENT_CLINIC_OR_DEPARTMENT_OTHER): Payer: Self-pay | Admitting: Physical Therapy

## 2021-08-02 ENCOUNTER — Other Ambulatory Visit: Payer: Self-pay

## 2021-08-02 DIAGNOSIS — R2689 Other abnormalities of gait and mobility: Secondary | ICD-10-CM

## 2021-08-02 DIAGNOSIS — G8929 Other chronic pain: Secondary | ICD-10-CM

## 2021-08-02 DIAGNOSIS — M25561 Pain in right knee: Secondary | ICD-10-CM | POA: Diagnosis not present

## 2021-08-02 NOTE — Therapy (Addendum)
OUTPATIENT PHYSICAL THERAPY TREATMENT NOTE/discharge    Patient Name: Caroline Ryan MRN: [id number] DOB:12-02-1965, 55 y.o., female Today's Date: 08/02/2021  PCP: [name], FNP REFERRING PROVIDER: [name], MD   PT End of Session - 08/02/21 1050     Visit Number 4    Number of Visits 12    Date for PT Re-Evaluation 08/15/21    PT Start Time 1035    PT Stop Time 1119    PT Time Calculation (min) 44 min    Activity Tolerance Patient tolerated treatment well    Behavior During Therapy [facility] for tasks assessed/performed             Past Medical History:  Diagnosis Date   Anemia    Asthma    Depression    no meds   Eczema    Fibroid    Headache(784.0)    otc prn med   History of blood transfusion    Hypertension    Knee pain, bilateral    arthralgia knee pain bilateral   Seasonal allergies    SVD (spontaneous vaginal delivery)    x 3   Vaginal polyp 05/2020   Past Surgical History:  Procedure Laterality Date   ABDOMINAL HYSTERECTOMY     BILATERAL SALPINGECTOMY Bilateral 10/10/2017   Procedure: BILATERAL SALPINGECTOMY;  Surgeon: Chancy Milroy, MD;  Location: [facility];  Service: Gynecology;  Laterality: Bilateral;   BREAST BIOPSY     US guided core 2009   BREAST EXCISIONAL BIOPSY     right 1982 left [place]     bilateral cysts/benign   VAGINAL HYSTERECTOMY N/A 10/10/2017   Procedure: HYSTERECTOMY VAGINAL;  Surgeon: Chancy Milroy, MD;  Location: [facility];  Service: Gynecology;  Laterality: N/A;   Patient Active Problem List   Diagnosis Date Noted   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/2020   Unilateral primary osteoarthritis, left knee 10/22/2019   Unilateral primary osteoarthritis, right knee 10/22/2019   BMI 50.0-59.9, adult ([place]) 10/22/2019   Grieving 06/10/2019   Pruritus 06/10/2019   Bilateral lower extremity edema 11/02/2018   Post-operative state 10/10/2017   Symptomatic anemia 05/20/2017   Arthralgia of both knees  06/27/2015   BACK STRAIN, LUMBAR 03/16/2010   KNEE PAIN, BILATERAL 06/30/2009   OBESITY 04/13/2009   Edema 04/13/2009   FIBROADENOMA, BREAST 04/20/2008   HYPERTENSION, BENIGN ESSENTIAL 04/14/2008   Allergic rhinitis 04/14/2008   Asthma 04/14/2008   PCP: [name], FNP (Inactive)   REFERRING PROVIDER: [name], MD   REFERRING DIAG: M17.0 (ICD-10-CM) - Bilateral primary osteoarthritis of knee   THERAPY DIAG:  Bilateral knee pain    ONSET DATE: >2 years    SUBJECTIVE:    SUBJECTIVE STATEMENT: Pt states 1.5 days of decreased pain after last aquatics session.  Was also able to walk rather than use WC to get to pool today  PerTINENT HISTORY:  Lumbar strain but she reports its mild; Bilateral Knee Pain             PAIN:  Are you having pain? Yes VAS scale: 4/10 Pain location: medial  Pain orientation: Right  PAIN TYPE: aching and burning Pain description: intermittent  Aggravating factors: Standing and walking  Relieving factors: rest, Voltaren   PAIN:  Are you having pain? Yes VAS scale: 4/10 Pain location: left knee  Pain orientation: Left medial  PAIN TYPE: aching Pain description: intermittent  Aggravating factors: standing and walking  Relieving factors: Standing and walking  PRECAUTIONS: None   WEIGHT BEARING RESTRICTIONS No   FALLS:  Has patient fallen in last 6 months? No   LIVING ENVIRONMENT: 3 slights of stairs into the house. Has guardrail.  OCCUPATION: Works as a Quarry manager      PLOF: Independent   PATIENT GOALS  To be able to do her daily activity without as much pain      OBJECTIVE:    DIAGNOSTIC FINDINGS: Severe Knee OA bilateral    PATIENT SURVEYS:  FOTO     COGNITION:          Overall cognitive status: Within functional limits for tasks assessed                        SENSATION:          Light touch: Appears intact          Stereognosis: Appears intact          Hot/Cold: Appears intact          Proprioception:  Appears intact       POSTURE:  Rounded shoulders; Bilateral flat foot. Patient has no bought new shoes in some time She has an undert but they are old as well; bilateral knee verus.      TODAY'S TREATMENT: 12/14  Pt seen for aquatic therapy today.  Treatment took place in water 3.25-4 ft in depth at the [facility] pool. Temp of water was 93.  Pt entered/exited the pool via stairs (step through pattern) independently with bilat rail.   Warm up: forward, retro and side stepping 4 widths each.    Exercises: squats 2x10 submerged to chest; standing weight shift x20, hip circles 2 x 10 cues for tight TA;add/abd; marching 2x20 Walking with submerged yellow hand buoys 4 widths each forward retro and sidestepping. Gait training:cues for stride length, heel strike Seated LAQ 2x20; cycle 2 x20 revolutions  Pt requires buoyancy for support and to offload joints with strengthening exercises. Viscosity of the water is needed for resistance of strengthening; water current perturbations provides challenge to standing balance unsupported, requiring increased core activation.   9-7 Seated LAQ 2x20 each leg  Seated march 2x20 Supine LTR x20  Supine piriformis stretch 2x30 sec hold with education on how to progress.   Supine march 2x10 with cuing on how to progress  Ball squeeze 2x10 with core breathing  Hip abduction 2x10 red  Quad set 2x15 bilateral with 3-4 sec hold   Reviewed inflammatory foods to avoid at home and anti-inflammatory foods.   12/2    Pt seen for aquatic therapy today.  Treatment took place in water 3.25-4 ft in depth at the [facility] pool. Temp of water was 91.  Pt entered/exited the pool via stairs (step through pattern) independently with bilat rail.   Introduction to water. Had patient stand at different levels so she could feel the bouncy    Warm up: heel/toe walking x4 laps across pool chest deep side stepping x4 laps from shallow to deep; Log  strides 2 laps; marching 2 laps; lateral walking 2x     Exercises;hip 3 way x20; standing weight shift x20    Seated LAQ 2x20 each leg  Seaetdmarch 2x20          Pt requires buoyancy for support and to offload joints with strengthening exercises. Viscosity of the water is needed for resistance of strengthening; water current perturbations provides challenge to standing balance unsupported, requiring increased core activation. Eval  Exercises Supine Quad Set  10 reps - 5sec hold Supine Straight Leg Raises 10 reps Seated Hip Abduction with Resistance 10 reps     PATIENT EDUCATION:  Education details: anatomy of condition; benefits of hip strengthening for knee stability; POC going forward and progression.  Person educated: Patient Education method: Explanation, Demonstration, Tactile cues, Verbal cues, and Handouts Education comprehension: verbalized understanding, returned demonstration, verbal cues required, tactile cues required, and needs further education     HOME EXERCISE PROGRAM: Access Code: [email or web address] URL: [email or web address] Date: 07/04/2021 Prepared by: Carolyne Littles   Exercises Supine Quad Set - 2 x daily - 7 x weekly - 3 sets - 10 reps - 5sec hold Supine Straight Leg Raises - 2 x daily - 7 x weekly - 3 sets - 10 reps Seated Hip Abduction with Resistance - 2 x daily - 7 x weekly - 3 sets - 10 reps     ASSESSMENT:   CLINICAL IMPRESSION: Good result from last aquatics session.  Pt reports being very pleased with decreased pain.  She is directed through stretching and strengthening exercises executes well without complaints of increased pain. Added core strengthening while completing LE exercises and instruction on importance of keeping core strong. Needed cueing for improved gait, knee flex following through with heel strike.     Objective impairments include Abnormal gait, decreased activity tolerance, decreased balance, decreased endurance,  decreased mobility, difficulty walking, decreased ROM, decreased strength, and pain. These impairments are limiting patient from cleaning, community activity, occupation, shopping, and yard work. Personal factors including 3+ comorbidities: severe OA; bilateral flat foot; obesity; low back pain   are also affecting patient's functional outcome. Patient will benefit from skilled PT to address above impairments and improve overall function.   REHAB POTENTIAL: Good Patient very motivated    CLINICAL DECISION MAKING: Evolving/moderate complexity   EVALUATION COMPLEXITY: Moderate     GOALS: Goals reviewed with patient? Yes   SHORT TERM GOALS:   STG Name Target Date Goal status  1 Patient will demonstrate proper technique with bilateral patella mobilization  Baseline:  07/25/2021 INITIAL  2 Patient will increase bilateral hip flexion to 4/5  Baseline:  07/25/2021 INITIAL  3 Patient will be independent with basic HEP for home and in the pool  Baseline: 07/25/2021 INITIAL  LONG TERM GOALS:    LTG Name Target Date Goal status  1 Patient will ambulate 1/2 mile without increased pain in order to walk for exercises  Baseline: 08/15/2021 INITIAL  2 Patient will stand for 1/2 hour without increased pain in order to perform ADL's   Baseline 08/15/2021 INITIAL  3 Patient will demonstrate adherence to complete HEP that promotes weight loss and strengthening Baseline: 08/15/2021 INITIAL  PLAN: PT FREQUENCY: 1-2x/week   PT DURATION: 6 weeks   PLANNED INTERVENTIONS: Therapeutic exercises, Therapeutic activity, Neuro Muscular re-education, Balance training, Gait training, Patient/Family education, Joint mobilization, Stair training, Aquatic Therapy, Electrical stimulation, Cryotherapy, Moist heat, Taping, Ultrasound, Ionotophoresis 29m/ml Dexamethasone, and Manual therapy   PLAN FOR NEXT SESSION: review HEP; begin cardio training; consider intervals on nu-step; in the pool ork on standing hip 4 way; on  land also add standing exercises; review hamstring, quad, and gastroc stretching;  PHYSICAL THERAPY DISCHARGE SUMMARY  Visits from Start of Care:4  Current functional level related to goals / functional outcomes: Improving pain but still had painful knees    Remaining deficits: Continues to have pain    Education / Equipment: HEP   Patient agrees to discharge. Patient  goals were not met. Patient is being discharged due to not returning since the last visit.   Carolyne Littles PT DPT ( discharge)  09/25/2021  Vedia Pereyra MPT 08/02/2021, 1:51 PM

## 2021-08-03 ENCOUNTER — Other Ambulatory Visit: Payer: Self-pay

## 2021-08-03 ENCOUNTER — Ambulatory Visit (INDEPENDENT_AMBULATORY_CARE_PROVIDER_SITE_OTHER): Payer: Medicaid Other | Admitting: Pharmacist

## 2021-08-03 ENCOUNTER — Ambulatory Visit (HOSPITAL_COMMUNITY): Payer: Medicaid Other | Attending: Cardiology

## 2021-08-03 VITALS — BP 164/94 | HR 91

## 2021-08-03 DIAGNOSIS — I10 Essential (primary) hypertension: Secondary | ICD-10-CM

## 2021-08-03 DIAGNOSIS — R9431 Abnormal electrocardiogram [ECG] [EKG]: Secondary | ICD-10-CM | POA: Diagnosis not present

## 2021-08-03 LAB — BASIC METABOLIC PANEL
BUN/Creatinine Ratio: 28 — ABNORMAL HIGH (ref 9–23)
BUN: 16 mg/dL (ref 6–24)
CO2: 24 mmol/L (ref 20–29)
Calcium: 9.8 mg/dL (ref 8.7–10.2)
Chloride: 102 mmol/L (ref 96–106)
Creatinine, Ser: 0.58 mg/dL (ref 0.57–1.00)
Glucose: 89 mg/dL (ref 70–99)
Potassium: 3.6 mmol/L (ref 3.5–5.2)
Sodium: 139 mmol/L (ref 134–144)
eGFR: 105 mL/min/{1.73_m2} (ref >59)

## 2021-08-03 LAB — ECHOCARDIOGRAM COMPLETE
MV M vel: 4.96 m/s
MV Peak grad: 98.4 mmHg
Radius: 0.7 cm
S' Lateral: 3.9 cm

## 2021-08-03 NOTE — Patient Instructions (Signed)
Put your clonidine patch back on  I will call you tomorrow with lab and echo results We will make medication changes at that point  Please call me at [phone] with any questions
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
## 2021-08-04 ENCOUNTER — Telehealth: Payer: Self-pay | Admitting: Pharmacist

## 2021-08-04 NOTE — Telephone Encounter (Signed)
Called patient to review labs. Patient reports that she has not been taking the spironolactone. She ran out and has not picked up the 50mg  tablets at the pharmacy yet. Yesterday she said she was doubling up, which I thought she meant on the spironolactone 25mg , but she meant she was taking 2 of the lisinopril 40mg  tablets. I advised to only take 1 lisinopril 40mg  tablet. To pick up the spironolactone 50mg  and start taking. She did put her clonidine patch on. Follow up 1/9 in office for BP check and labs.

## 2021-08-07 ENCOUNTER — Other Ambulatory Visit: Payer: Self-pay

## 2021-08-09 ENCOUNTER — Encounter (HOSPITAL_BASED_OUTPATIENT_CLINIC_OR_DEPARTMENT_OTHER): Payer: Medicaid Other | Admitting: Physical Therapy

## 2021-08-11 ENCOUNTER — Ambulatory Visit (HOSPITAL_BASED_OUTPATIENT_CLINIC_OR_DEPARTMENT_OTHER): Payer: Medicaid Other | Attending: Cardiology | Admitting: Cardiology

## 2021-08-15 ENCOUNTER — Other Ambulatory Visit: Payer: Self-pay

## 2021-08-15 ENCOUNTER — Encounter
Payer: Medicaid Other | Attending: Physical Medicine and Rehabilitation | Admitting: Physical Medicine and Rehabilitation

## 2021-08-15 ENCOUNTER — Encounter: Payer: Self-pay | Admitting: Physical Medicine and Rehabilitation

## 2021-08-15 VITALS — BP 164/91 | HR 86 | Temp 97.8°F | Ht 60.0 in | Wt 264.0 lb

## 2021-08-15 DIAGNOSIS — M170 Bilateral primary osteoarthritis of knee: Secondary | ICD-10-CM | POA: Diagnosis not present

## 2021-08-15 MED ORDER — CYCLOBENZAPRINE HCL 5 MG PO TABS
5.0000 mg | ORAL_TABLET | Freq: Three times a day (TID) | ORAL | 0 refills | Status: DC | PRN
Start: 2021-08-15 — End: 2021-09-14

## 2021-08-15 MED ORDER — WEGOVY 0.25 MG/0.5ML ~~LOC~~ SOAJ: 0.2500 mg | SUBCUTANEOUS | 4 refills | Status: DC

## 2021-08-15 NOTE — Progress Notes (Signed)
Knee injection, bilateral  Indication: Bilateral knee pain not relieved by medication management and other conservative care.  Informed consent was obtained after describing risks and benefits of the procedure with the patient, this includes bleeding, bruising, infection and medication side effects. The patient wishes to proceed and has given written consent. The patient was placed in a recumbent position. The medial aspect of the knee was marked and prepped with Betadine and alcohol. It was then entered with a 25-gauge 1-1/2 inch needle and Synvisc-One injected into the knee joint after negative draw back for blood. The procedure was repeated on the other side. The patient tolerated the procedure well. Post procedure instructions were given.

## 2021-08-15 NOTE — Patient Instructions (Addendum)
Foods that can assist in weight loss: 1) leafy greens- high in fiber and nutrients 2) dark chocolate- improves metabolism (if prefer sweetened, best to sweeten with honey instead of sugar).  3) cruciferous vegetables- high in fiber and protein 4) full fat yogurt: high in healthy fat, protein, calcium, and probiotics 5) apples- high in a variety of phytochemicals 6) nuts- high in fiber and protein that increase feelings of fullness 7) grapefruit: rich in nutrients, antioxidants, and fiber (not to be taken with anticoagulation) 8) beans- high in protein and fiber 9) salmon- has high quality protein and healthy fats 10) green tea- rich in polyphenols 11) eggs- rich in choline and vitamin D 12) tuna- high protein, boosts metabolism 13) avocado- decreases visceral abdominal fat 14) chicken (pasture raised): high in protein and iron 15) blueberries- reduce abdominal fat and cholesterol 16) whole grains- decreases calories retained during digestion, speeds metabolism 17) chia seeds- curb appetite 18) chilies- increases fat metabolism  -Discussed supplements that can be used:  1) Metatrim 400mg BID 30 minutes before breakfast and dinner  2) Sphaeranthus indicus and Garcinia mangostana (combinations of these and #1 can be found in capsicum and zychrome  3) green coffee bean extract 400mg twice per day or Irvingia (african mango) 150 to 300mg twice per day.  

## 2021-08-15 NOTE — Addendum Note (Signed)
Addended by: [name] on: 08/15/2021 11:46 AM   Modules accepted: Orders

## 2021-08-16 ENCOUNTER — Encounter: Payer: Self-pay | Admitting: Gastroenterology

## 2021-08-18 ENCOUNTER — Ambulatory Visit (HOSPITAL_BASED_OUTPATIENT_CLINIC_OR_DEPARTMENT_OTHER): Payer: Medicaid Other | Admitting: Physical Therapy

## 2021-08-28 ENCOUNTER — Ambulatory Visit: Payer: Medicaid Other

## 2021-09-07 ENCOUNTER — Encounter: Payer: Self-pay | Admitting: Cardiology

## 2021-09-07 ENCOUNTER — Ambulatory Visit (INDEPENDENT_AMBULATORY_CARE_PROVIDER_SITE_OTHER): Payer: Medicaid Other | Admitting: Cardiology

## 2021-09-07 ENCOUNTER — Other Ambulatory Visit: Payer: Self-pay

## 2021-09-07 ENCOUNTER — Encounter
Payer: Medicaid Other | Attending: Physical Medicine and Rehabilitation | Admitting: Physical Medicine and Rehabilitation

## 2021-09-07 VITALS — BP 170/100 | HR 95 | Ht 61.0 in | Wt 259.0 lb

## 2021-09-07 DIAGNOSIS — R600 Localized edema: Secondary | ICD-10-CM | POA: Diagnosis not present

## 2021-09-07 DIAGNOSIS — M170 Bilateral primary osteoarthritis of knee: Secondary | ICD-10-CM | POA: Insufficient documentation

## 2021-09-07 DIAGNOSIS — I10 Essential (primary) hypertension: Secondary | ICD-10-CM | POA: Diagnosis not present

## 2021-09-07 DIAGNOSIS — I340 Nonrheumatic mitral (valve) insufficiency: Secondary | ICD-10-CM | POA: Diagnosis not present

## 2021-09-07 DIAGNOSIS — I77810 Thoracic aortic ectasia: Secondary | ICD-10-CM | POA: Diagnosis not present

## 2021-09-07 DIAGNOSIS — R9431 Abnormal electrocardiogram [ECG] [EKG]: Secondary | ICD-10-CM | POA: Diagnosis not present

## 2021-09-07 MED ORDER — CARVEDILOL 6.25 MG PO TABS
6.2500 mg | ORAL_TABLET | Freq: Two times a day (BID) | ORAL | 3 refills | Status: DC
  Filled 2021-09-07: qty 180, 90d supply, fill #0
  Filled 2021-12-18: qty 180, 90d supply, fill #1

## 2021-09-07 MED ORDER — OLMESARTAN MEDOXOMIL 40 MG PO TABS
40.0000 mg | ORAL_TABLET | Freq: Every day | ORAL | 3 refills | Status: DC
  Filled 2021-09-07: qty 90, 90d supply, fill #0
  Filled 2021-12-18: qty 90, 90d supply, fill #1
  Filled 2022-04-06 – 2022-07-04 (×2): qty 90, 90d supply, fill #2

## 2021-09-07 NOTE — Addendum Note (Signed)
Addended by: Antonieta Iba on: 09/07/2021 11:59 AM   Modules accepted: Orders

## 2021-09-07 NOTE — Progress Notes (Signed)
Cardiology  Note    Date:  09/07/2021   ID:  Caroline Ryan, DOB [birth year], MRN [id number]  PCP:  Caroline Dunk, NP  Cardiologist:  Fransico Him, MD   Chief Complaint  Patient presents with   Follow-up    Chronic lower extremity edema, hypertension, morbid obesity, mitral regurgitation     History of Present Illness:  Caroline Ryan is a 56 y.o. female  with a hx of asthma, anemia and HTN who has been having problems with LE edema . She was found to have an abnormal EKG with LVH by voltage and LAFB.  She has been having problems with LE edema since she started having problems with her knees.  She was started on Lasix 20mg  daily which has helped with her edema.  She is on Gabapentin and the dose was increased  and her LE edema has persisted.  When I saw her a few months ago her blood pressure was poorly controlled and her lisinopril HCT was stopped.  She was started on lisinopril 40 mg daily as continued on Lasix 20 mg daily.  I  recommended she come off gabapentin which could have been contributing to her edema. She has not talked to her PCP about it but she has an appt tomorrow with her.   2D echo done 08/03/2021 showed normal LV function with EF 60 to 65% with mildly dilated LV normal diastolic function, mild to moderate MR and borderline dilated aortic root at 38 mm.  She was also scheduled for an outpatient sleep study which was rescheduled several times and she failed to follow-up.  She is here today for followup and is doing well.  She denies any chest pain or pressure, SOB, DOE, PND, orthopnea, LE edema, dizziness, palpitations or syncope.  She is compliant with her meds and is tolerating meds with no SE.      Past Medical History:  Diagnosis Date   Anemia    Asthma    Depression    no meds   Dilated aortic root ([place])    62mm by echo 07/2021   Eczema    Fibroid    Headache(784.0)    otc prn med   History of blood transfusion    Hypertension    Knee  pain, bilateral    arthralgia knee pain bilateral   Lower extremity edema    Mitral regurgitation    Mild to moderate by echo 07/2021   Seasonal allergies    SVD (spontaneous vaginal delivery)    x 3   Vaginal polyp 05/2020    Past Surgical History:  Procedure Laterality Date   ABDOMINAL HYSTERECTOMY     BILATERAL SALPINGECTOMY Bilateral 10/10/2017   Procedure: BILATERAL SALPINGECTOMY;  Surgeon: Chancy Milroy, MD;  Location: [facility];  Service: Gynecology;  Laterality: Bilateral;   BREAST BIOPSY     US guided core 2009   BREAST EXCISIONAL BIOPSY     right 1982 left [place]     bilateral cysts/benign   VAGINAL HYSTERECTOMY N/A 10/10/2017   Procedure: HYSTERECTOMY VAGINAL;  Surgeon: Chancy Milroy, MD;  Location: [facility];  Service: Gynecology;  Laterality: N/A;    Current Medications: Current Meds  Medication Sig   acetaminophen (TYLENOL) 500 MG tablet Take 2 tablets (1,000 mg total) by mouth 2 (two) times daily as needed.   albuterol (PROAIR HFA) 108 (90 Base) MCG/ACT inhaler INHALE 1 PUFF EVERY 6 HOURS AS NEEDED FOR SHORTNESS  OF BREATH   cetirizine (ZYRTEC) 10 MG tablet Take 1 tablet (10 mg total) by mouth daily.   cloNIDine (CATAPRES - DOSED IN MG/24 HR) 0.1 mg/24hr patch Place 1 patch (0.1 mg total) onto the skin once a week.   cyclobenzaprine (FLEXERIL) 5 MG tablet Take 1 tablet (5 mg total) by mouth 3 (three) times daily as needed for muscle spasms.   diclofenac Sodium (VOLTAREN) 1 % GEL Apply 4 g topically 4 (four) times daily.   docusate sodium (COLACE) 100 MG capsule Take 1 capsule (100 mg total) by mouth 2 (two) times daily as needed.   furosemide (LASIX) 20 MG tablet Take 1 tablet (20 mg total) by mouth daily.   gabapentin (NEURONTIN) 300 MG capsule TAKE 1 CAPSULE (300 MG TOTAL) BY MOUTH 3 (THREE) TIMES DAILY.   hydrocortisone 2.5 % cream Apply topically 2 (two) times daily.   hydrOXYzine (ATARAX/VISTARIL) 25 MG tablet Take 1 tablet (25 mg total) by mouth  3 (three) times daily as needed.   ibuprofen (ADVIL) 800 MG tablet Take 1 tablet (800 mg total) by mouth 3 (three) times daily.   lisinopril (ZESTRIL) 40 MG tablet Take 1 tablet (40 mg total) by mouth daily.   montelukast (SINGULAIR) 10 MG tablet TAKE 1 TABLET (10 MG TOTAL) BY MOUTH AT BEDTIME.   NONFORMULARY OR COMPOUNDED ITEM Diclofenac 3%, Gabapentin 5%, Lidocaine 5%, Menthol 1%.  Apply 1-2 pumps three to four times per day as needed   Semaglutide-Weight Management (WEGOVY) 0.25 MG/0.5ML SOAJ Inject 0.25 mg into the skin once a week.   spironolactone (ALDACTONE) 50 MG tablet Take 1 tablet (50 mg total) by mouth daily.    Allergies:   Patient has no known allergies.   Social History   Socioeconomic History   Marital status: Single    Spouse name: Not on file   Number of children: Not on file   Years of education: Not on file   Highest education level: Not on file  Occupational History   Not on file  Tobacco Use   Smoking status: Former    Packs/day: 0.25    Years: 2.00    Pack years: 0.50    Types: Cigarettes    Quit date: 08/20/2014    Years since quitting: 7.0   Smokeless tobacco: Never  Vaping Use   Vaping Use: Never used  Substance and Sexual Activity   Alcohol use: Yes    Alcohol/week: 2.0 standard drinks    Types: 2 Glasses of wine per week   Drug use: No   Sexual activity: Yes    Birth control/protection: Condom  Other Topics Concern   Not on file  Social History Narrative   Not on file   Social Determinants of Health   Financial Resource Strain: Not on file  Food Insecurity: Not on file  Transportation Needs: Not on file  Physical Activity: Not on file  Stress: Not on file  Social Connections: Not on file     Family History:  The patient's family history includes Breast cancer (age of onset: 67) in her mother; Diabetes in her brother and brother; Hypertension in her father.   ROS:   Please see the history of present illness.    ROS All other systems  reviewed and are negative.  No flowsheet data found.   PHYSICAL EXAM:   VS:  BP (!) 170/100    Pulse 95    Ht 5\' 1"  (1.549 m)    Wt 259 lb (117.5 kg)  LMP 04/03/2017 (Approximate)    SpO2 97%    BMI 48.94 kg/m    GEN: Well nourished, well developed in no acute distress HEENT: Normal NECK: No JVD; No carotid bruits LYMPHATICS: No lymphadenopathy CARDIAC:RRR, no murmurs, rubs, gallops RESPIRATORY:  Clear to auscultation without rales, wheezing or rhonchi  ABDOMEN: Soft, non-tender, non-distended MUSCULOSKELETAL:  No edema; No deformity  SKIN: Warm and dry NEUROLOGIC:  Alert and oriented x 3 PSYCHIATRIC:  Normal affect   Wt Readings from Last 3 Encounters:  09/07/21 259 lb (117.5 kg)  08/15/21 264 lb (119.7 kg)  07/10/21 244 lb (110.7 kg)      Studies/Labs Reviewed:   EKG:  EKG is not ordered today.    Recent Labs: 03/23/2021: BNP 41.0 08/03/2021: BUN 16; Creatinine, Ser 0.58; Potassium 3.6; Sodium 139   Lipid Panel    Component Value Date/Time   CHOL 180 12/24/2018 1404   TRIG 57 12/24/2018 1404   HDL 72 12/24/2018 1404   CHOLHDL 2.5 12/24/2018 1404   CHOLHDL 2.0 06/27/2015 1216   VLDL 9 06/27/2015 1216   [place] 97 12/24/2018 1404    Additional studies/ records that were reviewed today include:  OV notes from PCP    ASSESSMENT:    1. Leg edema   2. Nonspecific abnormal electrocardiogram (ECG) (EKG)   3. HYPERTENSION, BENIGN ESSENTIAL   4. Morbid obesity ([place])   5. Nonrheumatic mitral valve regurgitation   6. Dilated aortic root ([facility])      PLAN:  In order of problems listed above:  LE edema -I suspect this is multifactorial related to morbid obesity, gabapentin side effect and dietary indiscretion with Na intake as well as intermittent NSAID use -She really has no lower extremity edema of note on exam today -She remains on gabapentin and I will send a note again to her PCP to see if they are willing to take her off of that to see if that helps with  the edema as well since the patient does not think it helps much with pain -2D echo showed normal LV function with normal diastolic function and mild to moderate MR -encouraged her to follow a < 2gm Na diet -Continue prescription drug management with Lasix 20 mg daily with as needed refills -Check bmet  Abnormal EKG -there is LVH by voltage -2D echo showed normal LV function with EF 60 to 65% and no LVH.  HTN -BP is poorly controlled -she misunderstood how to to use the Clonidine patch and only thought she was supposed to use it for a week and then take a week off so she has not had it on since yesterday -I am going to stop her Lisinopril and start Benicar 40mg  daily -start Carvedilol 6.25mg  BID -restart Clonidine patch -check BMET in [redacted] week along with PharmD HTN clinic visit in my office -would avoid CCB due to LE edema -get in lab PSG to assess for sleep apnea>>scheduled for early February  Morbid Obesity -she has bad knees and needs her knees fixed but weight is too high  Mitral regurgitation -Mild to moderate by 2D echocardiogram done 08/03/2021 -Repeat echo in 1 year to make sure this is stable  Dilated aortic root -78mm by echo 12/22 -repeat echo in 1 year -needs aggressive control of BP  Followup with me in 3 months  Time Spent: 20 minutes total time of encounter, including 15 minutes spent in face-to-face patient care on the date of this encounter. This time includes coordination of care and  counseling regarding above mentioned problem list. Remainder of non-face-to-face time involved reviewing chart documents/testing relevant to the patient encounter and documentation in the medical record. I have independently reviewed documentation from referring provider  Medication Adjustments/Labs and Tests Ordered: Current medicines are reviewed at length with the patient today.  Concerns regarding medicines are outlined above.  Medication changes, Labs and Tests ordered today are  listed in the Patient Instructions below.  There are no Patient Instructions on file for this visit.   Signed, Fransico Him, MD  09/07/2021 11:47 AM    [address], [place], [place]  [address] Phone: [phone]; Fax: [phone]

## 2021-09-07 NOTE — Patient Instructions (Addendum)
Medication Instructions:  Your physician has recommended you make the following change in your medication: 1) STOP taking Zestril (lisinopril)  2) START taking Benicar (olmesartan) 40 mg daily  3) START taking Coreg (carvedilol) 6.25 mg twice daily   *If you need a refill on your cardiac medications before your next appointment, please call your pharmacy*   Lab Work: IN ONE WEEK: BMET If you have labs (blood work) drawn today and your tests are completely normal, you will receive your results only by: [place] (if you have MyChart) OR A paper copy in the mail If you have any lab test that is abnormal or we need to change your treatment, we will call you to review the results.   Testing/Procedures: Your physician has requested that you have an echocardiogram in December 2023. Echocardiography is a painless test that uses sound waves to create images of your heart. It provides your doctor with information about the size and shape of your heart and how well your hearts chambers and valves are working. This procedure takes approximately one hour. There are no restrictions for this procedure.   Follow-Up: At [facility], you and your health needs are our priority.  As part of our continuing mission to provide you with exceptional heart care, we have created designated Provider Care Teams.  These Care Teams include your primary Cardiologist (physician) and Advanced Practice Providers (APPs -  Physician Assistants and Nurse Practitioners) who all work together to provide you with the care you need, when you need it.   Your next appointment:   3 month(s)  The format for your next appointment:   In Person  Provider:   Fransico Him, MD     Other Instructions You have been referred to see the Pharmacist in the Hypertension Clinic

## 2021-09-08 ENCOUNTER — Other Ambulatory Visit: Payer: Self-pay

## 2021-09-08 ENCOUNTER — Ambulatory Visit (INDEPENDENT_AMBULATORY_CARE_PROVIDER_SITE_OTHER): Payer: Medicaid Other | Admitting: Nurse Practitioner

## 2021-09-08 ENCOUNTER — Encounter: Payer: Self-pay | Admitting: Nurse Practitioner

## 2021-09-08 VITALS — BP 147/85 | HR 90 | Resp 16 | Wt 259.0 lb

## 2021-09-08 DIAGNOSIS — J4520 Mild intermittent asthma, uncomplicated: Secondary | ICD-10-CM

## 2021-09-08 DIAGNOSIS — L299 Pruritus, unspecified: Secondary | ICD-10-CM

## 2021-09-08 DIAGNOSIS — K5909 Other constipation: Secondary | ICD-10-CM | POA: Diagnosis not present

## 2021-09-08 DIAGNOSIS — I10 Essential (primary) hypertension: Secondary | ICD-10-CM

## 2021-09-08 DIAGNOSIS — Z6841 Body Mass Index (BMI) 40.0 and over, adult: Secondary | ICD-10-CM

## 2021-09-08 MED ORDER — HYDROXYZINE HCL 25 MG PO TABS
25.0000 mg | ORAL_TABLET | Freq: Three times a day (TID) | ORAL | 11 refills | Status: DC | PRN
  Filled 2021-09-08: qty 30, 10d supply, fill #0
  Filled 2022-02-13: qty 30, 10d supply, fill #1

## 2021-09-08 MED ORDER — DOCUSATE SODIUM 100 MG PO CAPS
100.0000 mg | ORAL_CAPSULE | Freq: Two times a day (BID) | ORAL | 2 refills | Status: AC | PRN
  Filled 2021-09-08: qty 30, 15d supply, fill #0

## 2021-09-08 MED ORDER — ALBUTEROL SULFATE HFA 108 (90 BASE) MCG/ACT IN AERS
INHALATION_SPRAY | RESPIRATORY_TRACT | 5 refills | Status: DC
  Filled 2021-09-08: qty 18, 34d supply, fill #0
  Filled 2021-10-12: qty 18, 34d supply, fill #1
  Filled 2021-11-16: qty 18, 34d supply, fill #2
  Filled 2021-12-20: qty 18, 34d supply, fill #3
  Filled 2022-03-09: qty 18, 34d supply, fill #4
  Filled 2022-08-06: qty 18, 34d supply, fill #5

## 2021-09-08 NOTE — Progress Notes (Signed)
Subjective:    Patient ID: Caroline Ryan, female    DOB: January 27, 1966, 57 y.o.   MRN: [id number]  HPI  An audio/video tele-health visit is felt to be the most appropriate encounter for this patient at this time. This is a follow up tele-visit via phone. The patient is at home. MD is at office.    Caroline Ryan presents for f/u of bilateral knee pain and obesity.   1) Bilateral knee pain  -We discussed her MRI results previously and she would like to pursue arthroscopic debridement if surgeon feels this would be appropriate. Stairs worsen her pain- she uses rails and tries to minimize using the stairs. The other day her legs gave way.  -she would like to try the compounding cream  -Her pain has been well controlled, 8/10 today.   2) Hypertension BP 152/92 in office previously. It is usually 140/70. Diet has ben pretty good but she still struggles to lose weight.   The cream was too expensive for [facility] at [facility]. She has been trying to walk. Her mood is a little more down today. The handicap sticker allows her to go out more.  She has been taking ibuprofen 800mg  and Gabapentin. She has tried Tylenol in the past and it does not help much. She would like to repeat Monovisc next visit in December.   3) Obesity Weight is 259 lbs, up 6 lbs from last visit.  -Last eats at 8pm -she is frustrated that [name] is not covered for her -Asks if I may write a letter regarding why [name] would be beneficial for her and I have done so, she asks that we mail this to her and she plans to talk with her insurance company.   Prior history: She is feeling very fatigued today. I have bene unable to get her into see the OBGYN as they did not accept her insurance.   Prior history:  Caroline Ryan is a 56 year old woman who presents with bilateral knee OA. Last visit we did bilateral knee viscosupplementation injections that has helped her so much.   She was helping her dad move last night.   Her BP  is well controlled.   She is taking the Norco about twice per day. This is the only medication that helps to ease her pain.   She hopes to be able to do shopping and spend time with her daughter.   She is 257 lbs, same weight is last time.   She is walking more than last month.   She wants to try a juice diet and help get her weight off. She has been eating fruits and vegetables.  She has been noting sharp pains shooting down her legs and arms. She felt weak when she had this pain. Her arm buckled when she had this pain.  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   Pain is 9/10 on average, and 7/10 right now.   She was diagnosed with fibroids and is stressed by the fact that she has not heard from the gynecologist yet.   She is also having hot flashes from menopause and asks what kind of treatments are available.   Pain Inventory Average Pain 8 Pain Right Now 8 My pain is sharp, burning and stabbing  In the last 24 hours, has pain interfered with the following? General activity 8 Relation with others 7 Enjoyment of life 9 What TIME of day is your pain at its worst? daytime  Sleep (in general) Fair  Pain is worse with: walking and sitting Pain improves with: rest, heat/ice, therapy/exercise, medication, TENS and injections Relief from Meds: 8  Family History  Problem Relation Age of Onset   Hypertension Father    Diabetes Brother    Diabetes Brother    Breast cancer Mother 67   Other Neg Hx    Social History   Socioeconomic History   Marital status: Single    Spouse name: Not on file   Number of children: Not on file   Years of education: Not on file   Highest education level: Not on file  Occupational History   Not on file  Tobacco Use   Smoking status: Former    Packs/day: 0.25    Years: 2.00    Pack years: 0.50    Types: Cigarettes    Quit date: 08/20/2014    Years since quitting: 7.0   Smokeless tobacco: Never  Vaping Use   Vaping Use: Never used   Substance and Sexual Activity   Alcohol use: Yes    Alcohol/week: 2.0 standard drinks    Types: 2 Glasses of wine per week   Drug use: No   Sexual activity: Yes    Birth control/protection: Condom  Other Topics Concern   Not on file  Social History Narrative   Not on file   Social Determinants of Health   Financial Resource Strain: Not on file  Food Insecurity: Not on file  Transportation Needs: Not on file  Physical Activity: Not on file  Stress: Not on file  Social Connections: Not on file   Past Surgical History:  Procedure Laterality Date   ABDOMINAL HYSTERECTOMY     BILATERAL SALPINGECTOMY Bilateral 10/10/2017   Procedure: BILATERAL SALPINGECTOMY;  Surgeon: Chancy Milroy, MD;  Location: [facility];  Service: Gynecology;  Laterality: Bilateral;   BREAST BIOPSY     US guided core 2009   BREAST EXCISIONAL BIOPSY     right 1982 left [place]     bilateral cysts/benign   VAGINAL HYSTERECTOMY N/A 10/10/2017   Procedure: HYSTERECTOMY VAGINAL;  Surgeon: Chancy Milroy, MD;  Location: [facility];  Service: Gynecology;  Laterality: N/A;   Past Surgical History:  Procedure Laterality Date   ABDOMINAL HYSTERECTOMY     BILATERAL SALPINGECTOMY Bilateral 10/10/2017   Procedure: BILATERAL SALPINGECTOMY;  Surgeon: Chancy Milroy, MD;  Location: [facility];  Service: Gynecology;  Laterality: Bilateral;   BREAST BIOPSY     US guided core 2009   BREAST EXCISIONAL BIOPSY     right 1982 left [place]     bilateral cysts/benign   VAGINAL HYSTERECTOMY N/A 10/10/2017   Procedure: HYSTERECTOMY VAGINAL;  Surgeon: Chancy Milroy, MD;  Location: [facility];  Service: Gynecology;  Laterality: N/A;   Past Medical History:  Diagnosis Date   Anemia    Asthma    Depression    no meds   Dilated aortic root ([place])    71mm by echo 07/2021   Eczema    Fibroid    Headache(784.0)    otc prn med   History of blood transfusion    Hypertension    Knee pain, bilateral     arthralgia knee pain bilateral   Lower extremity edema    Mitral regurgitation    Mild to moderate by echo 07/2021   Seasonal allergies    SVD (spontaneous vaginal delivery)    x 3   Vaginal  polyp 05/2020   LMP 04/03/2017 (Approximate)   Opioid Risk Score:   Fall Risk Score:  `1  Depression screen PHQ 2/9  Depression screen [facility] 2/9 08/15/2021 04/25/2021 02/06/2021 08/09/2020 05/20/2020 04/07/2020 03/04/2020  Decreased Interest 0 2 1 2  0 1 1  Down, Depressed, Hopeless 0 2 1 2 1 1 1   PHQ - 2 Score 0 4 2 4 1 2 2   Altered sleeping - - - 2 - - -  Tired, decreased energy - - - 1 - - -  Change in appetite - - - 1 - - -  Feeling bad or failure about yourself  - - - 1 - - -  Trouble concentrating - - - 0 - - -  Moving slowly or fidgety/restless - - - 0 - - -  Suicidal thoughts - - - 0 - - -  PHQ-9 Score - - - 9 - - -  Some recent data might be hidden    Review of Systems  Constitutional: Negative.   HENT: Negative.    Eyes: Negative.   Respiratory: Negative.    Cardiovascular: Negative.   Gastrointestinal: Negative.   Endocrine: Negative.   Genitourinary: Negative.   Musculoskeletal:  Positive for arthralgias and gait problem.  Skin: Negative.   Allergic/Immunologic: Negative.   Hematological: Negative.   Psychiatric/Behavioral: Negative.    All other systems reviewed and are negative.     Objective:   Physical Exam Seen via phone.     Assessment & Plan:  Caroline Ryan is a 56 year old woman presenting to establish care for bilateral chronic knee pain, neck pain.   1) Bilateral knee OA: -XRs reviewed and consistent with OA -Reviewed MRI results which show bilaterals severe osteoarthritis which is worst in the medial compartment, as well as severe bilateral cartilage damage. Discussed that arthroscopic debridement may be an option for her since she has been asked to lose weight prior to TKA and this has been difficult for her.  -She received steroid injections q7months. -She  had viscosupplementation injections in July with excellent benefits.  -Vitamin D level normal.  -Recommended blue emu oil -Discussed her walking.Made goal to increase to 15 minutes at least 4 times per week.  -Discussed that every pound she loses is 6 lbs off her knees.  -Increase Gabapentin to 600mg  TID. Start at night and don't drive initially after starting higher dose as could make her very sleepy. Educated that Gabapentin can help with her hot flashes.  -continue to work on weight loss -Zilretta bilateral as soon as available.  -Called in compounding cream of Diclofenac 3%, Gabapentin 5%, Lidocaine 5%, Menthol 1% compounded at [facility]:  apply 1-2 pumps three to four times per day as needed    2) Muscle cramps:magneisum level was normal last visit.   3) Morbid obesity: Weight is 259, 2 lbs up from last visit. Discussed intermittent fasting and low carb diet. Continue healthy diet. Made goal to start eating at 11:15am instead of 11am and to move dinner time from 8:30pm to 8:15pm. She has moved this to 8pm, discussed trying to move to 7:45pm.  HgbA1C 5.2- discussed that there is no prediabetes -referred to bariatric surgery -wrote letter for her explaining why she would benefit from [facility]  4) General health -Reviewed to gynecology for pap smear -Reviewed lipids and were normal last year.   5) Radicular symptoms in upper and lower extremities: -no evidence of weakness on exam to suggest myelopathy -Cervical XR results reviewed  with her: they show mild spondylosis at C5-6 and C6-7.  No acute fracture. Lumbar XR reviewed with patient and shows 1. No acute compression fracture. 2. A 7 mm anterolisthesis of L4 on L5, presumably degenerative in etiology. 3. Multilevel degenerative disc disease and facet arthrosis. -Patient worried about heart attack- advised that symptoms appear to be more radicular and are not accompanied by chest pain.   6) Palpitations:  -She has had  occasional episodes -Denies chest pain. She gets SOB due to her asthma.   7) Iron deficiency anemia: Iron was very low in 2018- she has a history of menorrhagia and so she could continue to have anemia. Hemoglobin was within normal limits when checked in May. Discussed iron rich foods as she prefers no supplement at this time.   8) hypertension: Start clonodine patch 0.1mg .   7 minutes spent in discussion of Wegovy, mechanism of action, recommending high protein/low carb diet, intermittent fasting for weight loss, discussing compounding cream and potential cost

## 2021-09-08 NOTE — Patient Instructions (Signed)
You were seen today in the [facility] for medication management. Labs were collected, results will be available via MyChart or, if abnormal, you will be contacted by clinic staff. You were prescribed medications, please take as directed. Please follow up in 86mths for reevaluation.   Eye Doctors That Accept Medicaid and/or [facility]  [facility] [address] [place],  [place], [place] [address] [phone] [email or web address]   [facility] [address] [place], [place] [address] [phone] [email or web address]   [place], [place], [place] [address] Located next to [employer] Phone: [place], [place], [place] [address] Located next to [employer] Phone: [phone] [email or web address]   [facility] [address], [place], [place] [address] [phone] [email or web address]   [facility] [email or web address] [place], [place] [address] [phone] [email or web address]   [place]. [place],  [address] [phone] [email or web address]

## 2021-09-08 NOTE — Progress Notes (Signed)
[place], [place]  [address] Phone:  [phone]   Fax:  [phone] Subjective:   Patient ID: Caroline Ryan [facility], female    DOB: Jun 20, 1966, 56 y.o.   MRN: [id number]  No chief complaint on file.  HPI Caroline Ryan 56 y.o. female  has a past medical history of Anemia, Asthma, Depression, Dilated aortic root ([place]), Eczema, Fibroid, Headache(784.0), History of blood transfusion, Hypertension, Knee pain, bilateral, Lower extremity edema, Mitral regurgitation, Seasonal allergies, SVD (spontaneous vaginal delivery), and Vaginal polyp (05/2020). To the [facility] for medication management.  States that she suffers from significant arthralgia in bilateral knees, which is currently being managed by orthopedics. Specialist recommend surgical repair of bilateral knees, but she has been unable to receive surgery due weight. Has been prescribed weight loss medication, but is currently not covered by her insurance. Requesting additional screening and/ documentation that will help her get medication at reduced cost. Currently contesting insurance companies decision not to pay for medication. States that she has attempted weight loss through conventional means with no success. Awaiting appointment with weight loss clinic.   Also requesting refill of Atarax, albuterol and colace, all other medications managed by orthopedics and/ cardiology.   Verbalizes recent increase in blurred vision. Uses OTC reading glasses, but suspects she needs new glasses. Requesting referral to opthalmology. Denies any other changes in vision or pain.   Denies any other complaints today. Denies any fever. Denies any fatigue, chest pain, shortness of breath, HA or dizziness. Denies any blurred vision, numbness or tingling.  Past Medical History:  Diagnosis Date   Anemia    Asthma    Depression    no meds   Dilated aortic root ([place])    23m by echo 07/2021   Eczema     Fibroid    Headache(784.0)    otc prn med   History of blood transfusion    Hypertension    Knee pain, bilateral    arthralgia knee pain bilateral   Lower extremity edema    Mitral regurgitation    Mild to moderate by echo 07/2021   Seasonal allergies    SVD (spontaneous vaginal delivery)    x 3   Vaginal polyp 05/2020    Past Surgical History:  Procedure Laterality Date   ABDOMINAL HYSTERECTOMY     BILATERAL SALPINGECTOMY Bilateral 10/10/2017   Procedure: BILATERAL SALPINGECTOMY;  Surgeon: EChancy Milroy MD;  [facility];  Service: Gynecology;  Laterality: Bilateral;   BREAST BIOPSY     u[place] core 2009   BREAST EXCISIONAL BIOPSY     right 1982 left 1[place]    bilateral cysts/benign   VAGINAL HYSTERECTOMY N/A 10/10/2017   Procedure: HYSTERECTOMY VAGINAL;  Surgeon: EChancy Milroy MD;  [facility];  Service: Gynecology;  Laterality: N/A;    Family History  Problem Relation Age of Onset   Hypertension Father    Diabetes Brother    Diabetes Brother    Breast cancer Mother 733  Other Neg Hx     Social History   Socioeconomic History   Marital status: Single    Spouse name: Not on file   Number of children: Not on file   Years of education: Not on file   Highest education level: Not on file  Occupational History   Not on file  Tobacco Use   Smoking status: Former  Packs/day: 0.25    Years: 2.00    Pack years: 0.50    Types: Cigarettes    Quit date: 08/20/2014    Years since quitting: 7.0   Smokeless tobacco: Never  Vaping Use   Vaping Use: Never used  Substance and Sexual Activity   Alcohol use: Yes    Alcohol/week: 2.0 standard drinks    Types: 2 Glasses of wine per week   Drug use: No   Sexual activity: Yes    Birth control/protection: Condom  Other Topics Concern   Not on file  Social History Narrative   Not on file   Social Determinants of Health   Financial Resource Strain: Not on file  Food Insecurity: Not on  file  Transportation Needs: Not on file  Physical Activity: Not on file  Stress: Not on file  Social Connections: Not on file  Intimate Partner Violence: Not on file    Outpatient Medications Prior to Visit  Medication Sig Dispense Refill   acetaminophen (TYLENOL) 500 MG tablet Take 2 tablets (1,000 mg total) by mouth 2 (two) times daily as needed. 120 tablet 11   carvedilol (COREG) 6.25 MG tablet Take 1 tablet (6.25 mg total) by mouth 2 (two) times daily. 180 tablet 3   cetirizine (ZYRTEC) 10 MG tablet Take 1 tablet (10 mg total) by mouth daily. 30 tablet 6   cloNIDine (CATAPRES - DOSED IN MG/24 HR) 0.1 mg/24hr patch Place 1 patch (0.1 mg total) onto the skin once a week. 4 patch 12   cyclobenzaprine (FLEXERIL) 5 MG tablet Take 1 tablet (5 mg total) by mouth 3 (three) times daily as needed for muscle spasms. 30 tablet 0   diclofenac Sodium (VOLTAREN) 1 % GEL Apply 4 g topically 4 (four) times daily. 100 g 3   furosemide (LASIX) 20 MG tablet Take 1 tablet (20 mg total) by mouth daily. 30 tablet 6   gabapentin (NEURONTIN) 300 MG capsule TAKE 1 CAPSULE (300 MG TOTAL) BY MOUTH 3 (THREE) TIMES DAILY. 90 capsule 6   hydrocortisone 2.5 % cream Apply topically 2 (two) times daily. 30 g 1   ibuprofen (ADVIL) 800 MG tablet Take 1 tablet (800 mg total) by mouth 3 (three) times daily. 30 tablet 6   montelukast (SINGULAIR) 10 MG tablet TAKE 1 TABLET (10 MG TOTAL) BY MOUTH AT BEDTIME. 90 tablet 6   NONFORMULARY OR COMPOUNDED ITEM Diclofenac 3%, Gabapentin 5%, Lidocaine 5%, Menthol 1%.  Apply 1-2 pumps three to four times per day as needed 1 each 0   olmesartan (BENICAR) 40 MG tablet Take 1 tablet (40 mg total) by mouth daily. 90 tablet 3   Semaglutide-Weight Management (WEGOVY) 0.25 MG/0.5ML SOAJ Inject 0.25 mg into the skin once a week. 2 mL 4   spironolactone (ALDACTONE) 50 MG tablet Take 1 tablet (50 mg total) by mouth daily. 30 tablet 5   albuterol (PROAIR HFA) 108 (90 Base) MCG/ACT inhaler INHALE 1  PUFF EVERY 6 HOURS AS NEEDED FOR SHORTNESS OF BREATH 8.5 g 5   docusate sodium (COLACE) 100 MG capsule Take 1 capsule (100 mg total) by mouth 2 (two) times daily as needed. 30 capsule 2   hydrOXYzine (ATARAX/VISTARIL) 25 MG tablet Take 1 tablet (25 mg total) by mouth 3 (three) times daily as needed. 30 tablet 11   No facility-administered medications prior to visit.    No Known Allergies  Review of Systems  Constitutional:  Negative for chills, fever and malaise/fatigue.  HENT: Negative.  Eyes:  Positive for blurred vision. Negative for double vision, photophobia, pain, discharge and redness.  Respiratory:  Negative for cough and shortness of breath.   Cardiovascular:  Negative for chest pain, palpitations and leg swelling.  Gastrointestinal:  Negative for abdominal pain, blood in stool, constipation, diarrhea, nausea and vomiting.  Genitourinary: Negative.   Musculoskeletal:  Positive for joint pain. Negative for back pain, falls, myalgias and neck pain.  Skin: Negative.   Neurological: Negative.   Psychiatric/Behavioral:  Negative for depression. The patient is not nervous/anxious.   All other systems reviewed and are negative.     Objective:    Physical Exam Vitals reviewed.  Constitutional:      General: She is not in acute distress.    Appearance: Normal appearance. She is obese.  HENT:     Head: Normocephalic.     Right Ear: Tympanic membrane, ear canal and external ear normal.     Left Ear: Tympanic membrane, ear canal and external ear normal.     Nose: Nose normal.     Mouth/Throat:     Mouth: Mucous membranes are moist.     Pharynx: Oropharynx is clear.  Eyes:     Extraocular Movements: Extraocular movements intact.     Conjunctiva/sclera: Conjunctivae normal.     Pupils: Pupils are equal, round, and reactive to light.  Cardiovascular:     Rate and Rhythm: Normal rate and regular rhythm.     Pulses: Normal pulses.     Heart sounds: Normal heart sounds.      Comments: No obvious peripheral edema Pulmonary:     Effort: Pulmonary effort is normal.     Breath sounds: Normal breath sounds.  Abdominal:     General: Abdomen is flat. Bowel sounds are normal.     Palpations: Abdomen is soft.  Musculoskeletal:        General: No swelling, tenderness, deformity or signs of injury. Normal range of motion.     Cervical back: Normal range of motion and neck supple.     Right lower leg: No edema.     Left lower leg: No edema.  Skin:    General: Skin is warm and dry.     Capillary Refill: Capillary refill takes less than 2 seconds.  Neurological:     General: No focal deficit present.     Mental Status: She is alert and oriented to person, place, and time.  Psychiatric:        Mood and Affect: Mood normal.        Behavior: Behavior normal.        Thought Content: Thought content normal.        Judgment: Judgment normal.    BP (!) 147/85    Pulse 90    Resp 16    Wt 259 lb (117.5 kg)    LMP 04/03/2017 (Approximate)    SpO2 100%    BMI 48.94 kg/m  Wt Readings from Last 3 Encounters:  09/08/21 259 lb (117.5 kg)  09/07/21 259 lb (117.5 kg)  08/15/21 264 lb (119.7 kg)    Immunization History  Administered Date(s) Administered   Influenza Whole 06/30/2009   Influenza,inj,Quad PF,6+ Mos 06/27/2015, 07/04/2016, 06/09/2018, 06/10/2019   PFIZER(Purple Top)SARS-COV-2 Vaccination 12/25/2019, 01/15/2020   Pneumococcal Polysaccharide-23 04/14/2008, 06/27/2015   Td 10/18/2005   Tdap 07/04/2016    Diabetic Foot Exam - Simple   No data filed     Lab Results  Component Value Date   TSH 1.530 12/24/2018  Lab Results  Component Value Date   WBC 6.6 12/24/2018   HGB 11.4 12/24/2018   HCT 35.1 12/24/2018   MCV 94 12/24/2018   PLT 170 12/24/2018   Lab Results  Component Value Date   NA 139 08/03/2021   K 3.6 08/03/2021   CO2 24 08/03/2021   GLUCOSE 89 08/03/2021   BUN 16 08/03/2021   CREATININE 0.58 08/03/2021   BILITOT 0.3 12/24/2018    ALKPHOS 102 12/24/2018   AST 13 12/24/2018   ALT 17 12/24/2018   PROT 7.2 12/24/2018   ALBUMIN 4.4 12/24/2018   CALCIUM 9.8 08/03/2021   ANIONGAP 7 10/11/2017   EGFR 105 08/03/2021   Lab Results  Component Value Date   CHOL 180 12/24/2018   CHOL 179 11/15/2017   CHOL 157 06/27/2015   Lab Results  Component Value Date   HDL 72 12/24/2018   HDL 73 11/15/2017   HDL 79 06/27/2015   Lab Results  Component Value Date   LDLCALC 97 12/24/2018   LDLCALC 89 11/15/2017   LDLCALC 69 06/27/2015   Lab Results  Component Value Date   TRIG 57 12/24/2018   TRIG 83 11/15/2017   TRIG 44 06/27/2015   Lab Results  Component Value Date   CHOLHDL 2.5 12/24/2018   CHOLHDL 2.5 11/15/2017   CHOLHDL 2.0 06/27/2015   Lab Results  Component Value Date   HGBA1C 5.2 04/07/2020   HGBA1C 5.0 06/10/2019   HGBA1C 5.1 12/24/2018       Assessment & Plan:   Problem List Items Addressed This Visit       Cardiovascular and Mediastinum   HYPERTENSION, BENIGN ESSENTIAL   Relevant Orders   Lipid panel Encouraged continued diet and exercises efforts Informed to maintain follow up with cardiology     Respiratory   Asthma   Relevant Medications   albuterol (VENTOLIN HFA) 108 (90 Base) MCG/ACT inhaler Medications refilled as requested   Other Relevant Orders        Musculoskeletal and Integument   Pruritus   Relevant Medications   hydrOXYzine (ATARAX) 25 MG tablet   Other Visit Diagnoses     Morbid obesity with BMI of 45.0-49.9, adult ([place])    -  Primary   Relevant Orders   POCT glycosylated hemoglobin (Hb A1C): 5.2, wnl    Lipid panel Encouraged continued diet and exercise efforts   Other constipation       Relevant Medications   docusate sodium (COLACE) 100 MG capsule   Follow up in 6 mths for wellness exam, sooner as needed    I have changed Marcie Bal D. Macbride's albuterol and hydrOXYzine. I am also having her maintain her cetirizine, acetaminophen, diclofenac Sodium,  hydrocortisone, NONFORMULARY OR COMPOUNDED ITEM, gabapentin, montelukast, furosemide, ibuprofen, cloNIDine, spironolactone, Wegovy, cyclobenzaprine, olmesartan, carvedilol, and docusate sodium.  Meds ordered this encounter  Medications   albuterol (VENTOLIN HFA) 108 (90 Base) MCG/ACT inhaler    Sig: Inhale 1 puff every 6 hours as needed for shortness of breath    Dispense:  18 g    Refill:  5   hydrOXYzine (ATARAX) 25 MG tablet    Sig: Take 1 tablet (25 mg total) by mouth 3 (three) times daily as needed.    Dispense:  30 tablet    Refill:  11   docusate sodium (COLACE) 100 MG capsule    Sig: Take 1 capsule (100 mg total) by mouth 2 (two) times daily as needed.    Dispense:  30 capsule  Refill:  2     [name], NP

## 2021-09-09 LAB — LIPID PANEL
Chol/HDL Ratio: 3 ratio (ref 0.0–4.4)
Cholesterol, Total: 201 mg/dL — ABNORMAL HIGH (ref 100–199)
HDL: 67 mg/dL (ref >39)
LDL Chol Calc (NIH): 119 mg/dL — ABNORMAL HIGH (ref 0–99)
Triglycerides: 81 mg/dL (ref 0–149)
VLDL Cholesterol Cal: 15 mg/dL (ref 5–40)

## 2021-09-11 ENCOUNTER — Other Ambulatory Visit: Payer: Self-pay

## 2021-09-11 ENCOUNTER — Ambulatory Visit (INDEPENDENT_AMBULATORY_CARE_PROVIDER_SITE_OTHER): Payer: Medicaid Other | Admitting: Pharmacist

## 2021-09-11 ENCOUNTER — Other Ambulatory Visit: Payer: Medicaid Other

## 2021-09-11 VITALS — BP 158/98

## 2021-09-11 DIAGNOSIS — I340 Nonrheumatic mitral (valve) insufficiency: Secondary | ICD-10-CM | POA: Diagnosis not present

## 2021-09-11 DIAGNOSIS — I77810 Thoracic aortic ectasia: Secondary | ICD-10-CM

## 2021-09-11 DIAGNOSIS — R9431 Abnormal electrocardiogram [ECG] [EKG]: Secondary | ICD-10-CM

## 2021-09-11 DIAGNOSIS — I10 Essential (primary) hypertension: Secondary | ICD-10-CM | POA: Diagnosis not present

## 2021-09-11 DIAGNOSIS — R600 Localized edema: Secondary | ICD-10-CM | POA: Diagnosis not present

## 2021-09-11 NOTE — Progress Notes (Signed)
Patient ID: Caroline Ryan [facility]                 DOB: 07-16-1966                      MRN: [id number]     HPI: Caroline Ryan is a 56 y.o. female referred by Dr. Radford Pax to HTN clinic. PMH is significant for asthma, HTN, obesity, and bilateral knee pain. Pt was last seen by Dr. Radford Pax on 10/11 for LE edema. HCTZ was stopped as pt was already taking Lasix, and lisinopril dose was increased from 20mg  to 40mg  daily due to elevated BP of 160/98. At previous visit on 10/19, BP remained elevated at 144/100 and she was started on spironolactone 25mg  daily (avoided amlodipine since pt already reported LE edema, avoided thiazide since pt already on loop diuretic for her edema). She had a virtual visit with sports med the next day, BP was not assessed, she was started on a clonidine patch. At last visit patient reported blood pressures from 169/67-893'Y systolic. She does take ibuprofen 800 mg 1-3 tablets daily, Norco BID, and voltaren gel as Tylenol and gabapentin don't work for osteoarthritis in her knees.  At next visit, pt reported 1 episode of dizziness when she climbed the stairs too quickly. Thought swelling in her legs is a bit better. Pain level controlled better, had taken her BP meds and 1 ibuprofen so far that day. Started PT last week and was starting water therapy soon. Forgot to bring in home log of BP readings but had been checking them. Reported lowest BP reading of 139/60, highest of 101 systolic. Spironolactone was increased to 50mg  daily.   Patient presented to clinic on 12/15 for follow up. She stated she was not wearing her clonidine patch. She should had put a new one on a day or two ago.  She had not picked up higher strength of spironolactone but stated she had been doubling up on the 25's. However when patient was called the following day, she reported she did not have any spironolactone lefts and shew as taking 2 lisinopril 40mg  tablets. Advised her to only take 1 tablet of lisinopril  40mg  and to pick up and take 1 tab of spironolactone 50 mg daily.   At visit with Dr. Radford Pax on 1/19, BP was still elevated at 170/100 and lisinopril was stopped and olmesartan 40 mg daily & carvedilol 6.25 mg BID were started. Planned to assess for sleep apnea in early February and check BMET in 1 week at PharmD visit.  Pt presents to clinic today to assess medication changes. She endorses still having pain in her knees. She is doing rehab at [facility] but missed her appt and hasn't been able to reschedule. She is working to lower amount of salt/sweets/snack foods she eats. She is working to fast at night, not eating after 9 PM. Endorsed wanting to lose weight to help pain in knees, but is aware that GLP1ra will be too expensive. She endorses adherence to all blood pressure medications and no new side effects. She took all her blood pressure medications this morning. She said she has not missed any doses this week. Said she previously was using clonidine wrong because she thought it was one week on, one week off, etc, but now understands to simply replace it weekly. She is checking her blood pressure at home twice daily. She brought her blood pressure cuff into the visit today. It was measuring about  3-9 mmHg different than the clinic cuff. Band was correct size.   Current HTN meds: olmesartan 40 mg daily and carvedilol 6.25 mg BID,  spironolactone 50mg  daily, clonidine patch 0.1mg  weekly, furosemide 20 mg daily Previously tried: lisinopril-hctz, lisinopril 40mg  daily BP goal: <130/10mmHg  Family History: The patient's family history includes Breast cancer (age of onset: 65) in her mother; Diabetes in her brother and brother; Hypertension in her father.   Social History:  Reports that she quit smoking about 6 years ago. Her smoking use included cigarettes. She has a 0.50 pack-year smoking history. She has never used smokeless tobacco. She reports current alcohol use of about 2.0 standard drinks per  week. She reports that she does not use drugs.  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 She has poor mobility due to her knee pain, weight is currently too high for surgery, recently doing water aerobics  Home Blood Pressure: 144/94, HR 99 167/69, HR 92 120/66, HR 101 183/76, HR 63 172/81, HR 73  Home Cuff (in-clinic): 153/82 (HR 108), 147/100 (HR 84)  Clinic Cuff: 156/96, 158/98, (HR 95) 156/96   Wt Readings from Last 3 Encounters:  07/10/21 244 lb (110.7 kg)  06/26/21 258 lb (117 kg)  05/30/21 257 lb 3.2 oz (116.7 kg)   BP Readings from Last 3 Encounters:  07/10/21 (!) 146/100  06/07/21 (!) 144/100  05/30/21 (!) 160/98   Pulse Readings from Last 3 Encounters:  07/10/21 87  06/07/21 84  05/30/21 96    Renal function: CrCl cannot be calculated (Patient's most recent lab result is older than the maximum 21 days allowed.).  Past Medical History:  Diagnosis Date   Anemia    Asthma    Depression    no meds   Eczema    Fibroid    Headache(784.0)    otc prn med   History of blood transfusion    Hypertension    Knee pain, bilateral    arthralgia knee pain bilateral   Seasonal allergies    SVD (spontaneous vaginal delivery)    x 3   Vaginal polyp 05/2020    Current Outpatient Medications on File Prior to Visit  Medication Sig Dispense Refill   acetaminophen (TYLENOL) 500 MG tablet Take 2 tablets (1,000 mg total) by mouth 2 (two) times daily as needed. 120 tablet 11   albuterol (PROAIR HFA) 108 (90 Base) MCG/ACT inhaler INHALE 1 PUFF EVERY 6 HOURS AS NEEDED FOR SHORTNESS OF BREATH 8.5 g 5   cetirizine (ZYRTEC) 10 MG  tablet Take 1 tablet (10 mg total) by mouth daily. 30 tablet 6   cloNIDine (CATAPRES - DOSED IN MG/24 HR) 0.1 mg/24hr patch Place 1 patch (0.1 mg total) onto the skin once a week. 4 patch 12   diclofenac Sodium (VOLTAREN) 1 % GEL Apply 4 g topically 4 (four) times daily. 100 g 3   docusate sodium (COLACE) 100 MG capsule Take 1 capsule (100 mg total) by mouth 2 (two) times daily as needed. 30 capsule 2   furosemide (LASIX) 20 MG tablet Take 1 tablet (20 mg total) by mouth daily. [place]  tablet 6   gabapentin (NEURONTIN) 300 MG capsule TAKE 1 CAPSULE (300 MG TOTAL) BY MOUTH 3 (THREE) TIMES DAILY. 90 capsule 6   hydrocortisone 2.5 % cream Apply topically 2 (two) times daily. 30 g 1   hydrOXYzine (ATARAX/VISTARIL) 25 MG tablet Take 1 tablet (25 mg total) by mouth 3 (three) times daily as needed. 30 tablet 11   ibuprofen (ADVIL) 800 MG tablet Take 1 tablet (800 mg total) by mouth 3 (three) times daily. 30 tablet 6   lisinopril (ZESTRIL) 40 MG tablet Take 1 tablet (40 mg total) by mouth daily. 90 tablet 3   montelukast (SINGULAIR) 10 MG tablet TAKE 1 TABLET (10 MG TOTAL) BY MOUTH AT BEDTIME. 90 tablet 6   NONFORMULARY OR COMPOUNDED ITEM Diclofenac 3%, Gabapentin 5%, Lidocaine 5%, Menthol 1%.  Apply 1-2 pumps three to four times per day as needed 1 each 0   spironolactone (ALDACTONE) 50 MG tablet Take 1 tablet (50 mg total) by mouth daily. 30 tablet 5   No current facility-administered medications on file prior to visit.    No Known Allergies  Last menstrual period 04/03/2017.   Assessment/Plan:  1. Hypertension - BP is elevated above goal of <130/80 mmHg despite reported adherence to medication changes. Will call patient tomorrow with results of BMET and discuss next steps in medication management. Will plan to increase spironolactone to 100mg  daily if BMET stable. If not, will consider increasing carvedilol to 12.5mg  BID as HR is still elevated. Encouraged patient to continue pursuing a heart healthy  diet and increasing walking/swimming at rehab appts.   Jethro Poling, PharmD Student assisted in this visit.  Ramond Dial, Pharm.D, BCPS, CPP [employer]  [address] [place], [place] [address]  Phone: [phone]; Fax: [phone]  08/03/2021 8:05 AM

## 2021-09-11 NOTE — Patient Instructions (Signed)
Your BP goal is <130/80 mmHg.  It is elevated at 158/98 mmHg today.  Keep working on increasing your exercise and working on a heart healthy diet.  Continue taking your blood pressure medications.   We will call you tomorrow with your lab results and discuss next steps.

## 2021-09-12 LAB — BASIC METABOLIC PANEL
BUN/Creatinine Ratio: 25 — ABNORMAL HIGH (ref 9–23)
BUN: 18 mg/dL (ref 6–24)
CO2: 24 mmol/L (ref 20–29)
Calcium: 10.6 mg/dL — ABNORMAL HIGH (ref 8.7–10.2)
Chloride: 102 mmol/L (ref 96–106)
Creatinine, Ser: 0.73 mg/dL (ref 0.57–1.00)
Glucose: 100 mg/dL — ABNORMAL HIGH (ref 70–99)
Potassium: 4.1 mmol/L (ref 3.5–5.2)
Sodium: 142 mmol/L (ref 134–144)
eGFR: 96 mL/min/{1.73_m2} (ref >59)

## 2021-09-13 ENCOUNTER — Telehealth: Payer: Self-pay

## 2021-09-13 NOTE — Telephone Encounter (Signed)
3rd attempt to reach patient in the past two days regarding stable BMET and plan to increase spironolactone to 100 mg daily. Second voicemail has been left for patient to return call.

## 2021-09-14 ENCOUNTER — Other Ambulatory Visit: Payer: Self-pay

## 2021-09-14 ENCOUNTER — Other Ambulatory Visit: Payer: Self-pay | Admitting: Nurse Practitioner

## 2021-09-14 DIAGNOSIS — G8929 Other chronic pain: Secondary | ICD-10-CM

## 2021-09-15 ENCOUNTER — Other Ambulatory Visit: Payer: Self-pay

## 2021-09-15 MED ORDER — CYCLOBENZAPRINE HCL 5 MG PO TABS
5.0000 mg | ORAL_TABLET | Freq: Three times a day (TID) | ORAL | 0 refills | Status: DC | PRN
  Filled 2021-09-15: qty 30, 10d supply, fill #0

## 2021-09-15 MED ORDER — DICLOFENAC SODIUM 1 % EX GEL
4.0000 g | Freq: Four times a day (QID) | CUTANEOUS | 3 refills | Status: AC
Start: 2021-09-15 — End: ?
  Filled 2021-09-15: qty 100, 6d supply, fill #0
  Filled 2021-11-16: qty 100, 6d supply, fill #1
  Filled 2022-01-16: qty 100, 6d supply, fill #2
  Filled 2022-08-06 – 2022-08-10 (×2): qty 100, 6d supply, fill #3

## 2021-09-18 ENCOUNTER — Other Ambulatory Visit: Payer: Self-pay | Admitting: Nurse Practitioner

## 2021-09-18 ENCOUNTER — Other Ambulatory Visit: Payer: Self-pay

## 2021-09-18 DIAGNOSIS — J4520 Mild intermittent asthma, uncomplicated: Secondary | ICD-10-CM

## 2021-09-18 MED ORDER — CETIRIZINE HCL 10 MG PO TABS
10.0000 mg | ORAL_TABLET | Freq: Every day | ORAL | 6 refills | Status: DC
  Filled 2021-09-18: qty 30, 30d supply, fill #0
  Filled 2022-02-13: qty 30, 30d supply, fill #1

## 2021-09-20 ENCOUNTER — Other Ambulatory Visit: Payer: Self-pay

## 2021-09-24 ENCOUNTER — Other Ambulatory Visit: Payer: Self-pay

## 2021-09-24 ENCOUNTER — Ambulatory Visit (HOSPITAL_BASED_OUTPATIENT_CLINIC_OR_DEPARTMENT_OTHER): Payer: Medicaid Other | Attending: Cardiology | Admitting: Cardiology

## 2021-09-24 DIAGNOSIS — I10 Essential (primary) hypertension: Secondary | ICD-10-CM | POA: Insufficient documentation

## 2021-09-24 DIAGNOSIS — R9431 Abnormal electrocardiogram [ECG] [EKG]: Secondary | ICD-10-CM | POA: Insufficient documentation

## 2021-09-24 DIAGNOSIS — G4733 Obstructive sleep apnea (adult) (pediatric): Secondary | ICD-10-CM | POA: Diagnosis not present

## 2021-09-24 DIAGNOSIS — G4736 Sleep related hypoventilation in conditions classified elsewhere: Secondary | ICD-10-CM | POA: Insufficient documentation

## 2021-09-24 DIAGNOSIS — Z6841 Body Mass Index (BMI) 40.0 and over, adult: Secondary | ICD-10-CM | POA: Diagnosis not present

## 2021-09-24 DIAGNOSIS — R600 Localized edema: Secondary | ICD-10-CM | POA: Diagnosis not present

## 2021-09-25 ENCOUNTER — Other Ambulatory Visit (HOSPITAL_BASED_OUTPATIENT_CLINIC_OR_DEPARTMENT_OTHER): Payer: Self-pay

## 2021-09-25 DIAGNOSIS — R600 Localized edema: Secondary | ICD-10-CM

## 2021-09-25 DIAGNOSIS — I10 Essential (primary) hypertension: Secondary | ICD-10-CM

## 2021-09-25 DIAGNOSIS — R9431 Abnormal electrocardiogram [ECG] [EKG]: Secondary | ICD-10-CM

## 2021-09-26 NOTE — Procedures (Signed)
° °  Patient Name: Caroline Ryan, [place] Date:09/24/2021 Gender: Female D.O.B: February 15, 1966 Age (years): 67 Referring Provider: Fransico Him MD, ABSM Height (inches): 60 Interpreting Physician: Fransico Him MD, ABSM Weight (lbs): 252 RPSGT: Earney [place] BMI: 49 MRN: [id number] Neck Size: 17.50  CLINICAL INFORMATION Sleep Study Type: NPSG  Indication for sleep study: N/A  Epworth Sleepiness Score:  SLEEP STUDY TECHNIQUE As per the AASM Manual for the Scoring of Sleep and Associated Events v2.3 (April 2016) with a hypopnea requiring 4% desaturations.  The channels recorded and monitored were frontal, central and occipital EEG, electrooculogram (EOG), submentalis EMG (chin), nasal and oral airflow, thoracic and abdominal wall motion, anterior tibialis EMG, snore microphone, electrocardiogram, and pulse oximetry.  MEDICATIONS Medications self-administered by patient taken the night of the study : ZYRTEC, [place] The study was initiated at 10:50:22 PM and ended at 4:55:03 AM.  Sleep onset time was 12.4 minutes and the sleep efficiency was 84.3%. The total sleep time was 307.5 minutes.  Stage REM latency was 117.0 minutes.  The patient spent 1.8% of the night in stage N1 sleep, 86.7% in stage N2 sleep, 0.0% in stage N3 and 11.5% in REM.  Alpha intrusion was absent.  Supine sleep was 97.52%.  RESPIRATORY PARAMETERS The overall apnea/hypopnea index (AHI) was 22.0 per hour. There were 27 total apneas, including 27 obstructive, 0 central and 0 mixed apneas. There were 86 hypopneas and 31 RERAs.  The AHI during Stage REM sleep was 45.6 per hour.  AHI while supine was 21.0 per hour.  The mean oxygen saturation was 93.7%. The minimum SpO2 during sleep was 56.0%.  soft snoring was noted during this study.  CARDIAC DATA The 2 lead EKG demonstrated sinus rhythm. The mean heart rate was 78.9 beats per minute. Other EKG findings include: None.  LEG  MOVEMENT DATA The total PLMS were 0 with a resulting PLMS index of 0.0. Associated arousal with leg movement index was 0.0 .  IMPRESSIONS - Moderate obstructive sleep apnea occurred during this study (AHI = 22.0/h). - Severe oxygen desaturation was noted during this study (Min O2 = 56.0%). - The patient snored with soft snoring volume. - No cardiac abnormalities were noted during this study. - Clinically significant periodic limb movements did not occur during sleep. No significant associated arousals.  DIAGNOSIS - Obstructive Sleep Apnea (G47.33) - Nocturnal Hypoxemia (G47.36)  RECOMMENDATIONS - Therapeutic CPAP titration to determine optimal pressure required to alleviate sleep disordered breathing. - Avoid alcohol, sedatives and other CNS depressants that may worsen sleep apnea and disrupt normal sleep architecture. - Sleep hygiene should be reviewed to assess factors that may improve sleep quality. - Weight management and regular exercise should be initiated or continued if appropriate.  [Electronically signed] 09/26/2021 08:39 AM  Fransico Him MD, ABSM Diplomate, American Board of Sleep Medicine

## 2021-09-27 ENCOUNTER — Encounter: Payer: Self-pay | Admitting: Pharmacist

## 2021-09-28 ENCOUNTER — Telehealth: Payer: Self-pay | Admitting: *Deleted

## 2021-09-28 DIAGNOSIS — G4733 Obstructive sleep apnea (adult) (pediatric): Secondary | ICD-10-CM

## 2021-09-28 NOTE — Telephone Encounter (Signed)
-----   Message from Lauralee Evener, [place] sent at 09/27/2021 10:49 AM EST -----  ----- Message ----- From: [name], MD Sent: 09/26/2021   8:40 AM EST To: [facility] Sleep Studies  Please let patient know that they have sleep apnea.  Recommend therapeutic CPAP titration for treatment of patient's sleep disordered breathing.  If unable to perform an in lab titration then initiate ResMed auto CPAP from 4 to 15cm H2O with heated humidity and mask of choice and overnight pulse ox on CPAP.

## 2021-10-03 ENCOUNTER — Ambulatory Visit (AMBULATORY_SURGERY_CENTER): Payer: Medicaid Other

## 2021-10-03 ENCOUNTER — Other Ambulatory Visit: Payer: Self-pay

## 2021-10-03 VITALS — Ht 61.0 in | Wt 254.0 lb

## 2021-10-03 DIAGNOSIS — Z1211 Encounter for screening for malignant neoplasm of colon: Secondary | ICD-10-CM

## 2021-10-03 MED ORDER — NA SULFATE-K SULFATE-MG SULF 17.5-3.13-1.6 GM/177ML PO SOLN
1.0000 | Freq: Once | ORAL | 0 refills | Status: AC
  Filled 2021-10-03 – 2021-10-10 (×2): qty 354, 1d supply, fill #0

## 2021-10-03 NOTE — Telephone Encounter (Signed)
The patient has been notified of the result and verbalized understanding.  All questions (if any) were answered. [name], [place] 10/03/2021 1:06 PM    TITR to precert

## 2021-10-03 NOTE — Progress Notes (Signed)
° ° ° °  Patient's pre-visit was done today over the phone with the patient  ° °Name,DOB and address verified.  °  °Patient denies any allergies to Eggs and Soy.  ° °Patient denies any problems with anesthesia/sedation. ° °Patient denies taking diet pills or blood thinners.  ° °Denies atrial flutter or atrial fib ° °Denies chronic constipation ° °No home Oxygen.  ° °Packet of Prep instructions sent by My Chart or mail to patient including a copy of a consent form if by mail-pt is aware.  ° °Patient understands to call us back with any questions or concerns.  ° °Patient is aware of our care-partner policy and Covid-19 safety protocol.  ° ° ° °  ° °  °

## 2021-10-04 NOTE — Telephone Encounter (Addendum)
Prior Authorization for TITRATION sent to [facility]   via web portal. AUTH Number [id number]  APPROVED 10-12-21 TO 11-09-21. Will schedule her study.

## 2021-10-05 NOTE — Telephone Encounter (Signed)
Called results Left message on voicemail to call back.

## 2021-10-06 ENCOUNTER — Other Ambulatory Visit: Payer: Self-pay

## 2021-10-09 ENCOUNTER — Telehealth: Payer: Self-pay | Admitting: *Deleted

## 2021-10-09 NOTE — Progress Notes (Signed)
Left msg on patients voicemail providing her with the date, time and location for her cpap titration appointment.

## 2021-10-09 NOTE — Telephone Encounter (Signed)
Left msg on patients voicemail providing her with the date, time and location for her cpap titration appointment.

## 2021-10-10 ENCOUNTER — Other Ambulatory Visit: Payer: Self-pay

## 2021-10-12 ENCOUNTER — Other Ambulatory Visit: Payer: Self-pay

## 2021-10-12 ENCOUNTER — Other Ambulatory Visit: Payer: Self-pay | Admitting: Nurse Practitioner

## 2021-10-12 NOTE — Telephone Encounter (Signed)
Patient is scheduled for lab study on 11/09/21. Patient understands her sleep study will be done at [facility] sleep lab.  Left detailed message on voicemail with date and time of titration and informed patient to call back to confirm or reschedule.

## 2021-10-13 ENCOUNTER — Other Ambulatory Visit: Payer: Self-pay

## 2021-10-13 MED ORDER — CYCLOBENZAPRINE HCL 5 MG PO TABS
5.0000 mg | ORAL_TABLET | Freq: Three times a day (TID) | ORAL | 0 refills | Status: DC | PRN
  Filled 2021-10-13 (×2): qty 30, 10d supply, fill #0

## 2021-10-16 ENCOUNTER — Other Ambulatory Visit: Payer: Self-pay

## 2021-10-17 ENCOUNTER — Other Ambulatory Visit: Payer: Self-pay

## 2021-10-17 ENCOUNTER — Encounter: Payer: Self-pay | Admitting: Gastroenterology

## 2021-10-17 ENCOUNTER — Ambulatory Visit (AMBULATORY_SURGERY_CENTER): Payer: Medicaid Other | Admitting: Gastroenterology

## 2021-10-17 VITALS — BP 125/72 | HR 79 | Temp 97.7°F | Resp 13 | Ht 61.0 in | Wt 259.0 lb

## 2021-10-17 DIAGNOSIS — D123 Benign neoplasm of transverse colon: Secondary | ICD-10-CM

## 2021-10-17 DIAGNOSIS — Z1211 Encounter for screening for malignant neoplasm of colon: Secondary | ICD-10-CM | POA: Diagnosis not present

## 2021-10-17 HISTORY — PX: COLONOSCOPY: SHX174

## 2021-10-17 MED ORDER — SODIUM CHLORIDE 0.9 % IV SOLN: 500.0000 mL | Freq: Once | INTRAVENOUS | Status: DC

## 2021-10-17 NOTE — Progress Notes (Signed)
PT taken to PACU. Monitors in place. VSS. Report given to RN. 

## 2021-10-17 NOTE — Progress Notes (Signed)
Pt's states no medical or surgical changes since previsit or office visit.  ° °Vitals CW °

## 2021-10-17 NOTE — Progress Notes (Signed)
Referring Provider: [name], MD Primary Care Physician:  Bo Merino I, NP  Reason for Procedure:  Colon cancer screening   IMPRESSION:  Need for colon cancer screening Appropriate candidate for monitored anesthesia care  PLAN: Colonoscopy in the [place] today   HPI: Caroline Ryan is a 56 y.o. female presents for screening colonoscopy.  No prior colonoscopy or colon cancer screening.  No baseline GI symptoms.   No known family history of colon cancer or polyps. No family history of uterine/endometrial cancer, pancreatic cancer or gastric/stomach cancer.   Past Medical History:  Diagnosis Date   Anemia    Asthma    Depression    no meds   Dilated aortic root ([place])    72mm by echo 07/2021   Eczema    Fibroid    Headache(784.0)    otc prn med   History of blood transfusion    Hypertension    Knee pain, bilateral    arthralgia knee pain bilateral   Lower extremity edema    Mitral regurgitation    Mild to moderate by echo 07/2021   Seasonal allergies    SVD (spontaneous vaginal delivery)    x 3   Vaginal polyp 05/2020    Past Surgical History:  Procedure Laterality Date   ABDOMINAL HYSTERECTOMY     BILATERAL SALPINGECTOMY Bilateral 10/10/2017   Procedure: BILATERAL SALPINGECTOMY;  Surgeon: Chancy Milroy, MD;  Location: [facility];  Service: Gynecology;  Laterality: Bilateral;   BREAST BIOPSY     US guided core 2009   BREAST EXCISIONAL BIOPSY     right 1982 left [place]     bilateral cysts/benign   COLONOSCOPY  10/17/2021   VAGINAL HYSTERECTOMY N/A 10/10/2017   Procedure: HYSTERECTOMY VAGINAL;  Surgeon: Chancy Milroy, MD;  Location: [facility];  Service: Gynecology;  Laterality: N/A;    Current Outpatient Medications  Medication Sig Dispense Refill   acetaminophen (TYLENOL) 500 MG tablet Take 2 tablets (1,000 mg total) by mouth 2 (two) times daily as needed. 120 tablet 11   albuterol (VENTOLIN HFA) 108 (90 Base) MCG/ACT  inhaler Inhale 1 puff every 6 hours as needed for shortness of breath 18 g 5   aspirin 81 MG chewable tablet Chew by mouth daily.     carvedilol (COREG) 6.25 MG tablet Take 1 tablet (6.25 mg total) by mouth 2 (two) times daily. 180 tablet 3   cetirizine (ZYRTEC) 10 MG tablet Take 1 tablet (10 mg total) by mouth daily. 30 tablet 6   cloNIDine (CATAPRES - DOSED IN MG/24 HR) 0.1 mg/24hr patch Place 1 patch (0.1 mg total) onto the skin once a week. 4 patch 12   cyclobenzaprine (FLEXERIL) 5 MG tablet Take 1 tablet (5 mg total) by mouth 3 (three) times daily as needed for muscle spasms. 30 tablet 0   diclofenac Sodium (VOLTAREN) 1 % GEL Apply 4 g topically 4 (four) times daily. 100 g 3   furosemide (LASIX) 20 MG tablet Take 1 tablet (20 mg total) by mouth daily. 30 tablet 6   hydrocortisone 2.5 % cream Apply topically 2 (two) times daily. 30 g 1   ibuprofen (ADVIL) 800 MG tablet Take 1 tablet (800 mg total) by mouth 3 (three) times daily. 30 tablet 6   montelukast (SINGULAIR) 10 MG tablet TAKE 1 TABLET (10 MG TOTAL) BY MOUTH AT BEDTIME. 90 tablet 6   NONFORMULARY OR COMPOUNDED ITEM Diclofenac 3%, Gabapentin 5%, Lidocaine 5%, Menthol 1%.  Apply 1-2 pumps three to four times per day as needed 1 each 0   olmesartan (BENICAR) 40 MG tablet Take 1 tablet (40 mg total) by mouth daily. 90 tablet 3   spironolactone (ALDACTONE) 50 MG tablet Take 1 tablet (50 mg total) by mouth daily. 30 tablet 5   docusate sodium (COLACE) 100 MG capsule Take 1 capsule (100 mg total) by mouth 2 (two) times daily as needed. 30 capsule 2   hydrOXYzine (ATARAX) 25 MG tablet Take 1 tablet (25 mg total) by mouth 3 (three) times daily as needed. 30 tablet 11   Semaglutide-Weight Management (WEGOVY) 0.25 MG/0.5ML SOAJ Inject 0.25 mg into the skin once a week. (Patient not taking: Reported on 10/17/2021) 2 mL 4   Current Facility-Administered Medications  Medication Dose Route Frequency Provider Last Rate Last Admin   0.9 %  sodium  chloride infusion  500 mL Intravenous Once Thornton Park, MD        Allergies as of 10/17/2021   (No Known Allergies)    Family History  Problem Relation Age of Onset   Breast cancer Mother 61   Hypertension Father    Diabetes Brother    Diabetes Brother    Other Neg Hx    Colon polyps Neg Hx    Colon cancer Neg Hx    Esophageal cancer Neg Hx    Stomach cancer Neg Hx    Rectal cancer Neg Hx      Physical Exam: General:   Alert,  well-nourished, pleasant and cooperative in NAD Head:  Normocephalic and atraumatic. Eyes:  Sclera clear, no icterus.   Conjunctiva pink. Mouth:  No deformity or lesions.   Neck:  Supple; no masses or thyromegaly. Lungs:  Clear throughout to auscultation.   No wheezes. Heart:  Regular rate and rhythm; no murmurs. Abdomen:  Soft, non-tender, nondistended, normal bowel sounds, no rebound or guarding.  Msk:  Symmetrical. No boney deformities LAD: No inguinal or umbilical LAD Extremities:  No clubbing or edema. Neurologic:  Alert and  oriented x4;  grossly nonfocal Skin:  No obvious rash or bruise. Psych:  Alert and cooperative. Normal mood and affect.     Studies/Results: No results found.    Caroline Ryan L. Tarri Glenn, MD, MPH 10/17/2021, 8:27 AM

## 2021-10-17 NOTE — Patient Instructions (Signed)
Information on polyps, diverticulosis and hemorrhoids given to you today.  Await pathology results.  Follow a high fiber diet and drink plenty of water each day.  Resume previous medications.   YOU HAD AN ENDOSCOPIC PROCEDURE TODAY AT [facility]:   Refer to the procedure report that was given to you for any specific questions about what was found during the examination.  If the procedure report does not answer your questions, please call your gastroenterologist to clarify.  If you requested that your care partner not be given the details of your procedure findings, then the procedure report has been included in a sealed envelope for you to review at your convenience later.  YOU SHOULD EXPECT: Some feelings of bloating in the abdomen. Passage of more gas than usual.  Walking can help get rid of the air that was put into your GI tract during the procedure and reduce the bloating. If you had a lower endoscopy (such as a colonoscopy or flexible sigmoidoscopy) you may notice spotting of blood in your stool or on the toilet paper. If you underwent a bowel prep for your procedure, you may not have a normal bowel movement for a few days.  Please Note:  You might notice some irritation and congestion in your nose or some drainage.  This is from the oxygen used during your procedure.  There is no need for concern and it should clear up in a day or so.  SYMPTOMS TO REPORT IMMEDIATELY:  Following lower endoscopy (colonoscopy or flexible sigmoidoscopy):  Excessive amounts of blood in the stool  Significant tenderness or worsening of abdominal pains  Swelling of the abdomen that is new, acute  Fever of 100F or higher   For urgent or emergent issues, a gastroenterologist can be reached at any hour by calling [phone]. Do not use MyChart messaging for urgent concerns.    DIET:  We do recommend a small meal at first, but then you may proceed to your regular diet.  Drink plenty of  fluids but you should avoid alcoholic beverages for 24 hours.  ACTIVITY:  You should plan to take it easy for the rest of today and you should NOT DRIVE or use heavy machinery until tomorrow (because of the sedation medicines used during the test).    FOLLOW UP: Our staff will call the number listed on your records 48-72 hours following your procedure to check on you and address any questions or concerns that you may have regarding the information given to you following your procedure. If we do not reach you, we will leave a message.  We will attempt to reach you two times.  During this call, we will ask if you have developed any symptoms of COVID 19. If you develop any symptoms (ie: fever, flu-like symptoms, shortness of breath, cough etc.) before then, please call [phone].  If you test positive for Covid 19 in the 2 weeks post procedure, please call and report this information to [place].    If any biopsies were taken you will be contacted by phone or by letter within the next 1-3 weeks.  Please call us at [phone] if you have not heard about the biopsies in 3 weeks.    SIGNATURES/CONFIDENTIALITY: You and/or your care partner have signed paperwork which will be entered into your electronic medical record.  These signatures attest to the fact that that the information above on your After Visit Summary has been reviewed and is understood.  Full  responsibility of the confidentiality of this discharge information lies with you and/or your care-partner.

## 2021-10-17 NOTE — Progress Notes (Signed)
Called to room to assist during endoscopic procedure.  Patient ID and intended procedure confirmed with present staff. Received instructions for my participation in the procedure from the performing physician.  

## 2021-10-17 NOTE — Op Note (Signed)
[facility] Patient Name: Caroline Ryan Procedure Date: 10/17/2021 8:26 AM MRN: [id number] Endoscopist: Thornton Park MD, MD Age: 56 Referring MD:  Date of Birth: 05-Oct-1965 Gender: Female Account #: [id number] Procedure:                Colonoscopy Indications:              Screening for colorectal malignant neoplasm, This                            is the patient's first colonoscopy                           No known family history of colon cancer or polyps Medicines:                Monitored Anesthesia Care Procedure:                Pre-Anesthesia Assessment:                           - Prior to the procedure, a History and Physical                            was performed, and patient medications and                            allergies were reviewed. The patient's tolerance of                            previous anesthesia was also reviewed. The risks                            and benefits of the procedure and the sedation                            options and risks were discussed with the patient.                            All questions were answered, and informed consent                            was obtained. Prior Anticoagulants: The patient has                            taken no previous anticoagulant or antiplatelet                            agents. ASA Grade Assessment: III - A patient with                            severe systemic disease. After reviewing the risks                            and benefits, the patient was deemed in  satisfactory condition to undergo the procedure.                           After obtaining informed consent, the colonoscope                            was passed under direct vision. Throughout the                            procedure, the patient's blood pressure, pulse, and                            oxygen saturations were monitored continuously. The                            Olympus CF-HQ190L  [id number]) Colonoscope was                            introduced through the anus and advanced to the 3                            cm into the ileum. A second forward view of the                            right colon was performed. The colonoscopy was                            performed with moderate difficulty due to a                            redundant colon, significant looping and a tortuous                            colon. Successful completion of the procedure was                            aided by applying abdominal pressure. The patient                            tolerated the procedure well. The quality of the                            bowel preparation was good. The terminal ileum,                            ileocecal valve, appendiceal orifice, and rectum                            were photographed. Scope In: 8:25:00 AM Scope Out: 8:53:53 AM Scope Withdrawal Time: 0 hours 13 minutes 19 seconds  Total Procedure Duration: 0 hours 17 minutes 34 seconds  Findings:                 The perianal and digital rectal examinations were  normal.                           Non-bleeding internal hemorrhoids were found.                           A 2-3 mm polyp was found in the transverse colon.                            The polyp was flat. The polyp was removed with a                            cold snare. Resection and retrieval were complete.                            Estimated blood loss was minimal.                           Multiple small and large-mouthed diverticula were                            found in the entire colon.                           The exam was otherwise without abnormality on                            direct and retroflexion views. Complications:            No immediate complications. Estimated blood loss:                            Minimal. Estimated Blood Loss:     Estimated blood loss was minimal. Impression:               -  Non-bleeding internal hemorrhoids.                           - One 2-3 mm polyp in the transverse colon, removed                            with a cold snare. Resected and retrieved.                           - The examination was otherwise normal on direct                            and retroflexion views. Recommendation:           - Patient has a contact number available for                            emergencies. The signs and symptoms of potential                            delayed complications were discussed with the  patient. Return to normal activities tomorrow.                            Written discharge instructions were provided to the                            patient.                           - Continue present medications.                           - Await pathology results.                           - Repeat colonoscopy date to be determined after                            pending pathology results are reviewed for                            surveillance.                           - Follow a high fiber diet. Drink at least 64                            ounces of water daily. Add a daily stool bulking                            agent such as psyllium (an exampled would be                            Metamucil).                           - Emerging evidence supports eating a diet of                            fruits, vegetables, grains, calcium, and yogurt                            while reducing red meat and alcohol may reduce the                            risk of colon cancer.                           - Thank you for allowing me to be involved in your                            colon cancer prevention. Thornton Park MD, MD 10/17/2021 8:58:51 AM This report has been signed electronically.

## 2021-10-19 ENCOUNTER — Telehealth: Payer: Self-pay

## 2021-10-19 ENCOUNTER — Encounter: Payer: Self-pay | Admitting: Gastroenterology

## 2021-10-19 NOTE — Telephone Encounter (Signed)
?  Follow up Call- ? ?Call back number 10/17/2021  ?Post procedure Call Back phone  # [phone]  ?Permission to leave phone message Yes  ?Some recent data might be hidden  ?  ? ?Patient questions: ? ?Do you have a fever, pain , or abdominal swelling? No. ?Pain Score  0 * ? ?Have you tolerated food without any problems? yes ? ?Have you been able to return to your normal activities? Yes.   ? ?Do you have any questions about your discharge instructions: ?Diet   No. ?Medications  No. ?Follow up visit  No. ? ?Do you have questions or concerns about your Care? No. ? ?Actions: ?* If pain score is 4 or above: ?No action needed, pain <4. ? ?Have you developed a fever since your procedure? no ? ?2.   Have you had an respiratory symptoms (SOB or cough) since your procedure? no ? ?3.   Have you tested positive for COVID 19 since your procedure no ? ?4.   Have you had any family members/close contacts diagnosed with the COVID 19 since your procedure?  no ? ? ?If yes to any of these questions please route to Joylene John, RN and Joella Prince, RN  ? ? ?

## 2021-10-25 ENCOUNTER — Other Ambulatory Visit: Payer: Self-pay

## 2021-10-30 ENCOUNTER — Other Ambulatory Visit: Payer: Self-pay

## 2021-10-31 ENCOUNTER — Other Ambulatory Visit: Payer: Self-pay

## 2021-11-09 ENCOUNTER — Other Ambulatory Visit: Payer: Self-pay

## 2021-11-09 ENCOUNTER — Ambulatory Visit (HOSPITAL_BASED_OUTPATIENT_CLINIC_OR_DEPARTMENT_OTHER): Payer: Medicaid Other | Attending: Cardiology | Admitting: Cardiology

## 2021-11-09 DIAGNOSIS — Z79899 Other long term (current) drug therapy: Secondary | ICD-10-CM | POA: Insufficient documentation

## 2021-11-09 DIAGNOSIS — G4733 Obstructive sleep apnea (adult) (pediatric): Secondary | ICD-10-CM | POA: Insufficient documentation

## 2021-11-13 DIAGNOSIS — F321 Major depressive disorder, single episode, moderate: Secondary | ICD-10-CM | POA: Diagnosis not present

## 2021-11-14 DIAGNOSIS — F321 Major depressive disorder, single episode, moderate: Secondary | ICD-10-CM | POA: Diagnosis not present

## 2021-11-15 DIAGNOSIS — F321 Major depressive disorder, single episode, moderate: Secondary | ICD-10-CM | POA: Diagnosis not present

## 2021-11-16 ENCOUNTER — Other Ambulatory Visit: Payer: Self-pay

## 2021-11-16 ENCOUNTER — Other Ambulatory Visit: Payer: Self-pay | Admitting: Nurse Practitioner

## 2021-11-16 NOTE — Procedures (Signed)
? ? ?  Patient Name: Caroline Ryan, Caroline Ryan ?Study Date: 11/09/2021 ?Gender: Female ?D.O.B: [birth year] ?Age (years): 31 ?Referring Provider: Fransico Him MD, ABSM ?Height (inches): 61 ?Interpreting Physician: Fransico Him MD, ABSM ?Weight (lbs): 259 ?RPSGT: Zadie Rhine ?BMI: 49 ?MRN: [id number] ?Neck Size: 16.00 ? ?CLINICAL INFORMATION ?The patient is referred for a CPAP titration to treat sleep apnea. ? ?SLEEP STUDY TECHNIQUE ?As per the AASM Manual for the Scoring of Sleep and Associated Events v2.3 (April 2016) with a hypopnea requiring 4% desaturations. ? ?The channels recorded and monitored were frontal, central and occipital EEG, electrooculogram (EOG), submentalis EMG (chin), nasal and oral airflow, thoracic and abdominal wall motion, anterior tibialis EMG, snore microphone, electrocardiogram, and pulse oximetry. Continuous positive airway pressure (CPAP) was initiated at the beginning of the study and titrated to treat sleep-disordered breathing. ? ?MEDICATIONS ?Medications self-administered by patient taken the night of the study : ZYRTEC, HYDROXYZINE ? ?TECHNICIAN COMMENTS ?Comments added by technician: No restroom visted ?Comments added by scorer: N/A ? ?RESPIRATORY PARAMETERS ?Optimal PAP Pressure (cm):16  ?AHI at Optimal Pressure (/hr):0 ?Overall Minimal O2 (%):51.0  ?Supine % at Optimal Pressure (%):0 ?Minimal O2 at Optimal Pressure (%): 86.0  ? ?SLEEP ARCHITECTURE ?The study was initiated at 10:18:40 PM and ended at 5:11:22 AM. ? ?Sleep onset time was 27.9 minutes and the sleep efficiency was 85.7%. The total sleep time was 353.5 minutes. ? ?The patient spent 5.8% of the night in stage N1 sleep, 60.1% in stage N2 sleep, 0.6%% in stage N3 and 33.5% in REM.Stage REM latency was 89.0 minutes ? ?Wake after sleep onset was 31.3. Alpha intrusion was absent. Supine sleep was 60.54%. ? ?CARDIAC DATA ?The 2 lead EKG demonstrated sinus rhythm. The mean heart rate was 78.3 beats per minute. Other EKG findings include:  None. ? ?LEG MOVEMENT DATA ?The total Periodic Limb Movements of Sleep (PLMS) were 0. The PLMS index was 0.0. A PLMS index of <15 is considered normal in adults. ? ?IMPRESSIONS ?- The optimal PAP pressure was 16 cm of water. ?- Severe oxygen desaturations were observed during this titration (min O2 = 51.0%). ?- No snoring was audible during this study. ?- No cardiac abnormalities were observed during this study. ?- Clinically significant periodic limb movements were not noted during this study. Arousals associated with PLMs were rare. ? ?DIAGNOSIS ?- Obstructive Sleep Apnea (G47.33) ? ?RECOMMENDATIONS ?- Trial of CPAP therapy on 16 cm H2O with a Small size Resmed Full Face AirFit F20 mask and heated humidification. ?- Avoid alcohol, sedatives and other CNS depressants that may worsen sleep apnea and disrupt normal sleep architecture. ?- Sleep hygiene should be reviewed to assess factors that may improve sleep quality. ?- Weight management and regular exercise should be initiated or continued. ?- Return to Sleep Center for re-evaluation after 6 weeks of therapy ? ?[Electronically signed] 11/16/2021 04:38 PM ? ?Fransico Him MD, ABSM ?Diplomate, Tax adviser of Sleep Medicine ? ? ? ? ?

## 2021-11-17 ENCOUNTER — Other Ambulatory Visit: Payer: Self-pay

## 2021-11-17 MED ORDER — CYCLOBENZAPRINE HCL 5 MG PO TABS
5.0000 mg | ORAL_TABLET | Freq: Three times a day (TID) | ORAL | 0 refills | Status: DC | PRN
  Filled 2021-11-17 – 2021-12-18 (×2): qty 30, 10d supply, fill #0

## 2021-11-20 ENCOUNTER — Other Ambulatory Visit: Payer: Self-pay

## 2021-11-20 DIAGNOSIS — F321 Major depressive disorder, single episode, moderate: Secondary | ICD-10-CM | POA: Diagnosis not present

## 2021-11-21 DIAGNOSIS — F321 Major depressive disorder, single episode, moderate: Secondary | ICD-10-CM | POA: Diagnosis not present

## 2021-11-22 DIAGNOSIS — F321 Major depressive disorder, single episode, moderate: Secondary | ICD-10-CM | POA: Diagnosis not present

## 2021-11-23 ENCOUNTER — Ambulatory Visit (INDEPENDENT_AMBULATORY_CARE_PROVIDER_SITE_OTHER): Payer: Medicaid Other | Admitting: Nurse Practitioner

## 2021-11-23 ENCOUNTER — Encounter: Payer: Self-pay | Admitting: Nurse Practitioner

## 2021-11-23 ENCOUNTER — Other Ambulatory Visit: Payer: Self-pay

## 2021-11-23 VITALS — BP 130/76 | HR 82 | Temp 98.3°F | Ht 61.0 in | Wt 261.0 lb

## 2021-11-23 DIAGNOSIS — F321 Major depressive disorder, single episode, moderate: Secondary | ICD-10-CM | POA: Diagnosis not present

## 2021-11-23 DIAGNOSIS — J40 Bronchitis, not specified as acute or chronic: Secondary | ICD-10-CM

## 2021-11-23 DIAGNOSIS — J069 Acute upper respiratory infection, unspecified: Secondary | ICD-10-CM

## 2021-11-23 DIAGNOSIS — K5909 Other constipation: Secondary | ICD-10-CM

## 2021-11-23 MED ORDER — BENZONATATE 100 MG PO CAPS
200.0000 mg | ORAL_CAPSULE | Freq: Two times a day (BID) | ORAL | 0 refills | Status: DC | PRN
  Filled 2021-11-23: qty 20, 5d supply, fill #0

## 2021-11-23 MED ORDER — LINACLOTIDE 72 MCG PO CAPS
72.0000 ug | ORAL_CAPSULE | Freq: Every day | ORAL | 0 refills | Status: DC
  Filled 2021-11-23: qty 30, 30d supply, fill #0

## 2021-11-23 MED ORDER — AZITHROMYCIN 250 MG PO TABS
ORAL_TABLET | ORAL | 0 refills | Status: DC
  Filled 2021-11-23: qty 6, 5d supply, fill #0

## 2021-11-23 MED ORDER — PREDNISONE 20 MG PO TABS
20.0000 mg | ORAL_TABLET | Freq: Every day | ORAL | 0 refills | Status: AC
  Filled 2021-11-23: qty 5, 5d supply, fill #0

## 2021-11-23 NOTE — Patient Instructions (Signed)
You were seen today in the [facility] for URI and prescriptions.  You were prescribed medications, please take as directed. Please follow up  as needed ?

## 2021-11-23 NOTE — Progress Notes (Signed)
? ?[facility] ?[place], [place]  [address] ?Phone:  [phone]   Fax:  [phone] ?Subjective:  ? Patient ID: Caroline Ryan, female    DOB: Mar 02, 1966, 56 y.o.   MRN: [id number] ? ?Chief Complaint  ?Patient presents with  ? Nasal Congestion  ?  Pt stated she has nasal congestion, coughing up green mucus been feeling like this for the past 2 week's. Pt is requesting a refill for flexeril   ? ?HPI ?Caroline Ryan 56 y.o. female  has a past medical history of Anemia, Asthma, Depression, Dilated aortic root ([place]), Eczema, Fibroid, Headache(784.0), History of blood transfusion, Hypertension, Knee pain, bilateral, Lower extremity edema, Mitral regurgitation, Seasonal allergies, SVD (spontaneous vaginal delivery), and Vaginal polyp (05/2020). To the [facility] [address] for upper respiratory symptoms. ? ?Patient states that she has been having upper respiratory symptoms x 2 wks. Endorses productive cough with green sputum. Shortness of breath with increased coughing. Has been taking prescribed inhaler and OTC medications with no improvement in symptoms. Symptoms worsen at night. Has had symptoms in the past, but does not remember diagnosis and/ treatment. Denies any fever or sick contacts.  ? ?Patient also requesting precsription for linzess, states that she took once with positive results. States that it worked well for her constipation. Was given a sample and would like a full prescription. Denies any other concerns today. ? ?Denies any fatigue, chest pain,HA or dizziness. Denies any blurred vision, numbness or tingling. ? ?Past Medical History:  ?Diagnosis Date  ? Anemia   ? Asthma   ? Depression   ? no meds  ? Dilated aortic root ([place])   ? 73m by echo 07/2021  ? Eczema   ? Fibroid   ? Headache(784.0)   ? otc prn med  ? History of blood transfusion   ? Hypertension   ? Knee pain, bilateral   ? arthralgia knee pain bilateral  ? Lower extremity edema   ? Mitral regurgitation   ?  Mild to moderate by echo 07/2021  ? Seasonal allergies   ? SVD (spontaneous vaginal delivery)   ? x 3  ? Vaginal polyp 05/2020  ? ? ?Past Surgical History:  ?Procedure Laterality Date  ? ABDOMINAL HYSTERECTOMY    ? BILATERAL SALPINGECTOMY Bilateral 10/10/2017  ? Procedure: BILATERAL SALPINGECTOMY;  Surgeon: EChancy Milroy MD;  [facility];  Service: Gynecology;  Laterality: Bilateral;  ? BREAST BIOPSY    ? uKoreaguided core 2009  ? BREAST EXCISIONAL BIOPSY    ? right 1982 left 1981  ? BREAST SURGERY    ? bilateral cysts/benign  ? COLONOSCOPY  10/17/2021  ? VAGINAL HYSTERECTOMY N/A 10/10/2017  ? Procedure: HYSTERECTOMY VAGINAL;  Surgeon: EChancy Milroy MD;  [facility];  Service: Gynecology;  Laterality: N/A;  ? ? ?Family History  ?Problem Relation Age of Onset  ? Breast cancer Mother 719 ? Hypertension Father   ? Diabetes Brother   ? Diabetes Brother   ? Other Neg Hx   ? Colon polyps Neg Hx   ? Colon cancer Neg Hx   ? Esophageal cancer Neg Hx   ? Stomach cancer Neg Hx   ? Rectal cancer Neg Hx   ? ? ?Social History  ? ?Socioeconomic History  ? Marital status: Single  ?  Spouse name: Not on file  ? Number of children: Not on file  ? Years of education: Not on file  ? Highest education level: Not  on file  ?Occupational History  ? Not on file  ?Tobacco Use  ? Smoking status: Former  ?  Packs/day: 0.25  ?  Years: 2.00  ?  Pack years: 0.50  ?  Types: Cigarettes  ?  Quit date: 08/20/2014  ?  Years since quitting: 7.2  ? Smokeless tobacco: Never  ?Vaping Use  ? Vaping Use: Never used  ?Substance and Sexual Activity  ? Alcohol use: Yes  ?  Alcohol/week: 2.0 standard drinks  ?  Types: 2 Glasses of wine per week  ? Drug use: No  ? Sexual activity: Yes  ?  Birth control/protection: Condom  ?Other Topics Concern  ? Not on file  ?Social History Narrative  ? Not on file  ? ?Social Determinants of Health  ? ?Financial Resource Strain: Not on file  ?Food Insecurity: Not on file  ?Transportation Needs: Not on file   ?Physical Activity: Not on file  ?Stress: Not on file  ?Social Connections: Not on file  ?Intimate Partner Violence: Not on file  ? ? ?Outpatient Medications Prior to Visit  ?Medication Sig Dispense Refill  ? acetaminophen (TYLENOL) 500 MG tablet Take 2 tablets (1,000 mg total) by mouth 2 (two) times daily as needed. 120 tablet 11  ? albuterol (VENTOLIN HFA) 108 (90 Base) MCG/ACT inhaler Inhale 1 puff every 6 hours as needed for shortness of breath 18 g 5  ? aspirin 81 MG chewable tablet Chew by mouth daily.    ? carvedilol (COREG) 6.25 MG tablet Take 1 tablet (6.25 mg total) by mouth 2 (two) times daily. 180 tablet 3  ? cetirizine (ZYRTEC) 10 MG tablet Take 1 tablet (10 mg total) by mouth daily. 30 tablet 6  ? cloNIDine (CATAPRES - DOSED IN MG/24 HR) 0.1 mg/24hr patch Place 1 patch (0.1 mg total) onto the skin once a week. 4 patch 12  ? cyclobenzaprine (FLEXERIL) 5 MG tablet Take 1 tablet (5 mg total) by mouth 3 (three) times daily as needed for muscle spasms. 30 tablet 0  ? diclofenac Sodium (VOLTAREN) 1 % GEL Apply 4 g topically 4 (four) times daily. 100 g 3  ? docusate sodium (COLACE) 100 MG capsule Take 1 capsule (100 mg total) by mouth 2 (two) times daily as needed. 30 capsule 2  ? hydrocortisone 2.5 % cream Apply topically 2 (two) times daily. 30 g 1  ? hydrOXYzine (ATARAX) 25 MG tablet Take 1 tablet (25 mg total) by mouth 3 (three) times daily as needed. 30 tablet 11  ? NONFORMULARY OR COMPOUNDED ITEM Diclofenac 3%, Gabapentin 5%, Lidocaine 5%, Menthol 1%.  Apply 1-2 pumps three to four times per day as needed 1 each 0  ? olmesartan (BENICAR) 40 MG tablet Take 1 tablet (40 mg total) by mouth daily. 90 tablet 3  ? spironolactone (ALDACTONE) 50 MG tablet Take 1 tablet (50 mg total) by mouth daily. 30 tablet 5  ? furosemide (LASIX) 20 MG tablet Take 1 tablet (20 mg total) by mouth daily. 30 tablet 6  ? ibuprofen (ADVIL) 800 MG tablet Take 1 tablet (800 mg total) by mouth 3 (three) times daily. 30 tablet 6  ?  montelukast (SINGULAIR) 10 MG tablet TAKE 1 TABLET (10 MG TOTAL) BY MOUTH AT BEDTIME. 90 tablet 6  ? Semaglutide-Weight Management (WEGOVY) 0.25 MG/0.5ML SOAJ Inject 0.25 mg into the skin once a week. (Patient not taking: Reported on 10/17/2021) 2 mL 4  ? ?No facility-administered medications prior to visit.  ? ? ?No Known Allergies ? ?  Review of Systems  ?Constitutional:  Negative for chills, fever and malaise/fatigue.  ?HENT:  Positive for congestion.   ?Eyes: Negative.   ?Respiratory:  Positive for cough, sputum production and shortness of breath.   ?Cardiovascular:  Negative for chest pain, palpitations and leg swelling.  ?Gastrointestinal:  Negative for abdominal pain, blood in stool, constipation, diarrhea, nausea and vomiting.  ?Skin: Negative.   ?Neurological: Negative.   ?Psychiatric/Behavioral:  Negative for depression. The patient is not nervous/anxious.   ?All other systems reviewed and are negative. ? ?   ?Objective:  ?  ?Physical Exam ?Vitals reviewed.  ?Constitutional:   ?   General: She is not in acute distress. ?   Appearance: Normal appearance. She is obese.  ?HENT:  ?   Head: Normocephalic.  ?   Right Ear: Tympanic membrane, ear canal and external ear normal. There is no impacted cerumen.  ?   Left Ear: Tympanic membrane, ear canal and external ear normal. There is no impacted cerumen.  ?   Nose: Congestion present. No rhinorrhea.  ?   Mouth/Throat:  ?   Mouth: Mucous membranes are dry.  ?   Pharynx: Oropharynx is clear. No oropharyngeal exudate or posterior oropharyngeal erythema.  ?Eyes:  ?   General: No scleral icterus.    ?   Right eye: No discharge.     ?   Left eye: No discharge.  ?   Extraocular Movements: Extraocular movements intact.  ?   Conjunctiva/sclera: Conjunctivae normal.  ?   Pupils: Pupils are equal, round, and reactive to light.  ?Neck:  ?   Vascular: No carotid bruit.  ?Cardiovascular:  ?   Rate and Rhythm: Normal rate and regular rhythm.  ?   Pulses: Normal pulses.  ?   Heart  sounds: Normal heart sounds.  ?   Comments: No obvious peripheral edema ?Pulmonary:  ?   Effort: Pulmonary effort is normal.  ?   Breath sounds: Normal breath sounds.  ?Musculoskeletal:  ?   Cervical back: Normal r

## 2021-11-24 ENCOUNTER — Other Ambulatory Visit: Payer: Self-pay

## 2021-11-24 DIAGNOSIS — F321 Major depressive disorder, single episode, moderate: Secondary | ICD-10-CM | POA: Diagnosis not present

## 2021-11-27 ENCOUNTER — Ambulatory Visit: Payer: Medicaid Other | Admitting: Nurse Practitioner

## 2021-11-27 DIAGNOSIS — F321 Major depressive disorder, single episode, moderate: Secondary | ICD-10-CM | POA: Diagnosis not present

## 2021-11-28 DIAGNOSIS — F321 Major depressive disorder, single episode, moderate: Secondary | ICD-10-CM | POA: Diagnosis not present

## 2021-11-29 ENCOUNTER — Telehealth: Payer: Self-pay | Admitting: *Deleted

## 2021-11-29 DIAGNOSIS — F321 Major depressive disorder, single episode, moderate: Secondary | ICD-10-CM | POA: Diagnosis not present

## 2021-11-29 NOTE — Telephone Encounter (Signed)
The patient has been called to give her result. Left message to call back.   Caroline Ryan, [place] 11/29/2021 6:55 PM   ?

## 2021-11-29 NOTE — Telephone Encounter (Signed)
-----   Message from Lauralee Evener, [place] sent at 11/17/2021  8:06 AM EDT ----- ? ?----- Message ----- ?From: [name], MD ?Sent: 11/16/2021   4:41 PM EDT ?To: Cv Div Sleep Studies ? ?Please let patient know that they had a successful PAP titration and let DME know that orders are in EPIC.  Please set up 6 week OV with me.  ?  ? ?

## 2021-11-30 DIAGNOSIS — F321 Major depressive disorder, single episode, moderate: Secondary | ICD-10-CM | POA: Diagnosis not present

## 2021-12-01 DIAGNOSIS — F321 Major depressive disorder, single episode, moderate: Secondary | ICD-10-CM | POA: Diagnosis not present

## 2021-12-04 DIAGNOSIS — F321 Major depressive disorder, single episode, moderate: Secondary | ICD-10-CM | POA: Diagnosis not present

## 2021-12-05 DIAGNOSIS — M1711 Unilateral primary osteoarthritis, right knee: Secondary | ICD-10-CM | POA: Diagnosis not present

## 2021-12-05 DIAGNOSIS — Z6841 Body Mass Index (BMI) 40.0 and over, adult: Secondary | ICD-10-CM | POA: Diagnosis not present

## 2021-12-05 DIAGNOSIS — M1712 Unilateral primary osteoarthritis, left knee: Secondary | ICD-10-CM | POA: Diagnosis not present

## 2021-12-05 DIAGNOSIS — F321 Major depressive disorder, single episode, moderate: Secondary | ICD-10-CM | POA: Diagnosis not present

## 2021-12-06 DIAGNOSIS — F321 Major depressive disorder, single episode, moderate: Secondary | ICD-10-CM | POA: Diagnosis not present

## 2021-12-07 DIAGNOSIS — F321 Major depressive disorder, single episode, moderate: Secondary | ICD-10-CM | POA: Diagnosis not present

## 2021-12-08 DIAGNOSIS — F321 Major depressive disorder, single episode, moderate: Secondary | ICD-10-CM | POA: Diagnosis not present

## 2021-12-11 DIAGNOSIS — F321 Major depressive disorder, single episode, moderate: Secondary | ICD-10-CM | POA: Diagnosis not present

## 2021-12-12 DIAGNOSIS — F321 Major depressive disorder, single episode, moderate: Secondary | ICD-10-CM | POA: Diagnosis not present

## 2021-12-13 DIAGNOSIS — F321 Major depressive disorder, single episode, moderate: Secondary | ICD-10-CM | POA: Diagnosis not present

## 2021-12-14 DIAGNOSIS — F321 Major depressive disorder, single episode, moderate: Secondary | ICD-10-CM | POA: Diagnosis not present

## 2021-12-15 DIAGNOSIS — F321 Major depressive disorder, single episode, moderate: Secondary | ICD-10-CM | POA: Diagnosis not present

## 2021-12-18 ENCOUNTER — Other Ambulatory Visit: Payer: Self-pay | Admitting: Physical Medicine and Rehabilitation

## 2021-12-18 ENCOUNTER — Other Ambulatory Visit: Payer: Self-pay

## 2021-12-18 DIAGNOSIS — R52 Pain, unspecified: Secondary | ICD-10-CM

## 2021-12-18 MED ORDER — IBUPROFEN 800 MG PO TABS
800.0000 mg | ORAL_TABLET | Freq: Three times a day (TID) | ORAL | 6 refills | Status: DC
  Filled 2021-12-18: qty 30, 10d supply, fill #0
  Filled 2022-02-13: qty 30, 10d supply, fill #1
  Filled 2022-03-09: qty 30, 10d supply, fill #2
  Filled 2022-04-25 – 2022-05-03 (×2): qty 30, 10d supply, fill #3

## 2021-12-19 ENCOUNTER — Other Ambulatory Visit: Payer: Self-pay

## 2021-12-19 ENCOUNTER — Encounter: Payer: Self-pay | Admitting: *Deleted

## 2021-12-19 DIAGNOSIS — F321 Major depressive disorder, single episode, moderate: Secondary | ICD-10-CM | POA: Diagnosis not present

## 2021-12-19 NOTE — Telephone Encounter (Signed)
The patient has been called to give her result. Left message to call back.  [name], [place] 12/19/2021 10:50 AM   ?

## 2021-12-20 ENCOUNTER — Other Ambulatory Visit: Payer: Self-pay

## 2021-12-20 DIAGNOSIS — F321 Major depressive disorder, single episode, moderate: Secondary | ICD-10-CM | POA: Diagnosis not present

## 2021-12-21 DIAGNOSIS — F321 Major depressive disorder, single episode, moderate: Secondary | ICD-10-CM | POA: Diagnosis not present

## 2021-12-22 DIAGNOSIS — F321 Major depressive disorder, single episode, moderate: Secondary | ICD-10-CM | POA: Diagnosis not present

## 2021-12-23 DIAGNOSIS — F321 Major depressive disorder, single episode, moderate: Secondary | ICD-10-CM | POA: Diagnosis not present

## 2021-12-25 DIAGNOSIS — F321 Major depressive disorder, single episode, moderate: Secondary | ICD-10-CM | POA: Diagnosis not present

## 2021-12-26 ENCOUNTER — Encounter: Payer: Self-pay | Admitting: Cardiology

## 2021-12-26 ENCOUNTER — Other Ambulatory Visit: Payer: Self-pay

## 2021-12-26 ENCOUNTER — Ambulatory Visit (INDEPENDENT_AMBULATORY_CARE_PROVIDER_SITE_OTHER): Payer: Medicaid Other | Admitting: Cardiology

## 2021-12-26 VITALS — BP 142/92 | HR 81 | Ht 61.0 in | Wt 263.6 lb

## 2021-12-26 DIAGNOSIS — I77810 Thoracic aortic ectasia: Secondary | ICD-10-CM

## 2021-12-26 DIAGNOSIS — I340 Nonrheumatic mitral (valve) insufficiency: Secondary | ICD-10-CM

## 2021-12-26 DIAGNOSIS — I10 Essential (primary) hypertension: Secondary | ICD-10-CM

## 2021-12-26 DIAGNOSIS — R9431 Abnormal electrocardiogram [ECG] [EKG]: Secondary | ICD-10-CM | POA: Diagnosis not present

## 2021-12-26 DIAGNOSIS — G4733 Obstructive sleep apnea (adult) (pediatric): Secondary | ICD-10-CM | POA: Diagnosis not present

## 2021-12-26 DIAGNOSIS — R600 Localized edema: Secondary | ICD-10-CM

## 2021-12-26 DIAGNOSIS — F321 Major depressive disorder, single episode, moderate: Secondary | ICD-10-CM | POA: Diagnosis not present

## 2021-12-26 MED ORDER — CARVEDILOL 12.5 MG PO TABS
12.5000 mg | ORAL_TABLET | Freq: Two times a day (BID) | ORAL | 3 refills | Status: DC
  Filled 2021-12-26: qty 180, 90d supply, fill #0

## 2021-12-26 NOTE — Addendum Note (Signed)
Addended by: Antonieta Iba on: 12/26/2021 11:48 AM ? ? Modules accepted: Orders ? ?

## 2021-12-26 NOTE — Patient Instructions (Addendum)
Medication Instructions:  ?Your physician has recommended you make the following change in your medication:  ?1) INCREASE carvedilol to 12.5 mg twice daily  ?*If you need a refill on your cardiac medications before your next appointment, please call your pharmacy* ? ?Follow-Up: ?At [facility], you and your health needs are our priority.  As part of our continuing mission to provide you with exceptional heart care, we have created designated Provider Care Teams.  These Care Teams include your primary Cardiologist (physician) and Advanced Practice Providers (APPs -  Physician Assistants and Nurse Practitioners) who all work together to provide you with the care you need, when you need it. ?  ?Your next appointment:   ?Follow up with Dr. Radford Pax 6 weeks after receiving your CPAP machine.  ? ?Dr. Radford Pax has referred you to see our PharmD in the Hypertension clinic.  ? ?Important Information About Sugar ? ? ? ? ?  ?

## 2021-12-26 NOTE — Progress Notes (Signed)
?  ? ?Cardiology  Note   ? ?Date:  12/26/2021  ? ?ID:  Caroline Ryan, DOB 02-16-1966, MRN [id number] ? ?PCP:  [name], NP  ?Cardiologist:  Fransico Him, MD  ? ?Chief Complaint  ?Patient presents with  ? Sleep Apnea  ? Hypertension  ? Mitral Regurgitation  ? ? ? ?History of Present Illness:  ?Caroline Ryan is a 56 y.o. female  with a hx of asthma, anemia and HTN who has been having problems with LE edema . She was found to have an abnormal EKG with LVH by voltage and LAFB.  She has been having problems with LE edema since she started having problems with her knees.  She was started on Lasix '20mg'$  daily which has helped with her edema.  She is on Gabapentin and the dose was increased  and her LE edema has persisted.  When I saw her a few months ago her blood pressure was poorly controlled and her lisinopril HCT was stopped.  She was started on lisinopril 40 mg daily as continued on Lasix 20 mg daily.  I  recommended she come off gabapentin which could have been contributing to her edema.  ? ?2D echo done 08/03/2021 showed normal LV function with EF 60 to 65% with mildly dilated LV normal diastolic function, mild to moderate MR and borderline dilated aortic root at 38 mm.  She underwent outpatient sleep study on 09/24/2021 which demonstrated moderate obstructive sleep apnea with an AHI of 22/h and severe oxygen desaturations as low as 56%.  She underwent CPAP titration on 11/09/2021 to 16 cm H2O.  Her CPAP was ordered through the DME and the DME has tried to reach her on multiple occasions but she has not responded. ? ?She is here today for followup and is doing well.  She has had some problems with her allergies recently and started taking her inhaler and this has resolved.  She denies any chest pain or pressure, PND, orthopnea, LE edema, dizziness or syncope.  Rarely she will have a skipped heart beat. She is compliant with her meds and is tolerating meds with no SE.    ? ?Past Medical History:   ?Diagnosis Date  ? Anemia   ? Asthma   ? Depression   ? no meds  ? Dilated aortic root ([place])   ? 3m by echo 07/2021  ? Eczema   ? Fibroid   ? Headache(784.0)   ? otc prn med  ? History of blood transfusion   ? Hypertension   ? Knee pain, bilateral   ? arthralgia knee pain bilateral  ? Lower extremity edema   ? Mitral regurgitation   ? Mild to moderate by echo 07/2021  ? Seasonal allergies   ? SVD (spontaneous vaginal delivery)   ? x 3  ? Vaginal polyp 05/2020  ? ? ?Past Surgical History:  ?Procedure Laterality Date  ? ABDOMINAL HYSTERECTOMY    ? BILATERAL SALPINGECTOMY Bilateral 10/10/2017  ? Procedure: BILATERAL SALPINGECTOMY;  Surgeon: EChancy Milroy MD;  [facility];  Service: Gynecology;  Laterality: Bilateral;  ? BREAST BIOPSY    ? uKoreaguided core 2009  ? BREAST EXCISIONAL BIOPSY    ? right 1982 left 1981  ? BREAST SURGERY    ? bilateral cysts/benign  ? COLONOSCOPY  10/17/2021  ? VAGINAL HYSTERECTOMY N/A 10/10/2017  ? Procedure: HYSTERECTOMY VAGINAL;  Surgeon: EChancy Milroy MD;  [facility];  Service: Gynecology;  Laterality: N/A;  ? ? ?  Current Medications: ?No outpatient medications have been marked as taking for the 12/26/21 encounter (Office Visit) with [name], MD.  ? ? ?Allergies:   Patient has no known allergies.  ? ?Social History  ? ?Socioeconomic History  ? Marital status: Single  ?  Spouse name: Not on file  ? Number of children: Not on file  ? Years of education: Not on file  ? Highest education level: Not on file  ?Occupational History  ? Not on file  ?Tobacco Use  ? Smoking status: Former  ?  Packs/day: 0.25  ?  Years: 2.00  ?  Pack years: 0.50  ?  Types: Cigarettes  ?  Quit date: 08/20/2014  ?  Years since quitting: 7.3  ? Smokeless tobacco: Never  ?Vaping Use  ? Vaping Use: Never used  ?Substance and Sexual Activity  ? Alcohol use: Yes  ?  Alcohol/week: 2.0 standard drinks  ?  Types: 2 Glasses of wine per week  ? Drug use: No  ? Sexual activity: Yes  ?  Birth  control/protection: Condom  ?Other Topics Concern  ? Not on file  ?Social History Narrative  ? Not on file  ? ?Social Determinants of Health  ? ?Financial Resource Strain: Not on file  ?Food Insecurity: Not on file  ?Transportation Needs: Not on file  ?Physical Activity: Not on file  ?Stress: Not on file  ?Social Connections: Not on file  ?  ? ?Family History:  The patient's family history includes Breast cancer (age of onset: 71) in her mother; Diabetes in her brother and brother; Hypertension in her father.  ? ?ROS:   ?Please see the history of present illness.    ?ROS All other systems reviewed and are negative. ? ?   ? View : No data to display.  ?  ?  ?  ? ? ? ?PHYSICAL EXAM:   ?VS:  LMP 04/03/2017 (Approximate)    ?GEN: Well nourished, well developed in no acute distress ?HEENT: Normal ?NECK: No JVD; No carotid bruits ?LYMPHATICS: No lymphadenopathy ?CARDIAC:RRR, no murmurs, rubs, gallops ?RESPIRATORY:  Clear to auscultation without rales, wheezing or rhonchi  ?ABDOMEN: Soft, non-tender, non-distended ?MUSCULOSKELETAL:  No edema; No deformity  ?SKIN: Warm and dry ?NEUROLOGIC:  Alert and oriented x 3 ?PSYCHIATRIC:  Normal affect   ?Wt Readings from Last 3 Encounters:  ?11/23/21 261 lb (118.4 kg)  ?11/09/21 259 lb (117.5 kg)  ?10/17/21 259 lb (117.5 kg)  ?  ? ? ?Studies/Labs Reviewed:  ? ?EKG:  EKG is not ordered today.   ? ?Recent Labs: ?03/23/2021: BNP 41.0 ?09/11/2021: BUN 18; Creatinine, Ser 0.73; Potassium 4.1; Sodium 142  ? ?Lipid Panel ?   ?Component Value Date/Time  ? CHOL 201 (H) 09/08/2021 1229  ? TRIG 81 09/08/2021 1229  ? HDL 67 09/08/2021 1229  ? CHOLHDL 3.0 09/08/2021 1229  ? CHOLHDL 2.0 06/27/2015 1216  ? VLDL 9 06/27/2015 1216  ? LDLCALC 119 (H) 09/08/2021 1229  ? ? ?Additional studies/ records that were reviewed today include:  ?OV notes from PCP ? ? ? ?ASSESSMENT:   ? ?1. Leg edema   ?2. Nonspecific abnormal electrocardiogram (ECG) (EKG)   ?3. HYPERTENSION, BENIGN ESSENTIAL   ?4. OSA (obstructive  sleep apnea)   ?5. Nonrheumatic mitral valve regurgitation   ?6. Dilated aortic root ([place])   ? ? ? ? ?PLAN:  ?In order of problems listed above: ? ?LE edema ?-I suspect this is multifactorial related to morbid obesity, gabapentin side effect  and dietary indiscretion with Na intake as well as intermittent NSAID use ?-She really has no lower extremity edema of note on exam today ?-2D echo showed normal LV function with normal diastolic function and mild to moderate MR ?-encouraged her to follow a < 2gm Na diet ?-Continue prescription drug management Lasix 20 mg with as needed refills ?-I have personally reviewed and interpreted outside labs performed by patient's PCP which showed serum creatinine 0.73 potassium 4.1 on 09/11/2021 ? ?Abnormal EKG ?-there is LVH by voltage ?-2D echo showed normal LV function with EF 60 to 65% and no LVH. ? ?HTN ?-BP is elevated but improved on exam today ?-Continue prescription drug management with Benicar 40 mg daily and clonidine patch ?-increase Carvedilol to 12.'5mg'$  BID ?-would avoid CCB due to LE edema ?-hopefully her BP will improve after getting on CPAP ? ?OSA  ?-she was diagnosed with severe obstructive sleep apnea and underwent CPAP titration ?-Her CPAP was ordered but her DME has not been able to get in touch with the patient ?-I have told her today that she needs to contact the DME to get set up on her CPAP and stressed the importance of treating her sleep apnea which will help improve her blood pressure control ? ?Mitral regurgitation ?-Mild to moderate by 2D echocardiogram done 08/03/2021 ?-repeat echo 07/2022 to make sure this remains stable ? ?Dilated aortic root ?-67m by echo 12/22 ?-Repeat echo 08/08/2022 ?-needs aggressive control of BP ? ?Followup with me in 6 months and HTN clinic in 2 weeks ? ? ?Medication Adjustments/Labs and Tests Ordered: ?Current medicines are reviewed at length with the patient today.  Concerns regarding medicines are outlined above.  Medication  changes, Labs and Tests ordered today are listed in the Patient Instructions below. ? ?There are no Patient Instructions on file for this visit. ? ? ?Signed, ?TFransico Him MD  ?12/26/2021 11:04 AM    ?[facility]

## 2021-12-26 NOTE — Telephone Encounter (Signed)
Patient was in the office today and received her test result and her order has been sent to [facility]. ? ?Upon patient request DME selection is [facility] ?Patient understands he will be contacted by [facility] to set up his cpap. ?Patient understands to call if [facility] does not contact him with new setup in a timely manner. ?Patient understands they will be called once confirmation has been received from [facility]/ that they have received their new machine to schedule 10 week follow up appointment. ?  ?[facility] notified of new cpap order  ?Please add to airview ?Patient was grateful for the call and thanked me.  ?

## 2021-12-27 ENCOUNTER — Other Ambulatory Visit: Payer: Self-pay

## 2021-12-27 DIAGNOSIS — F321 Major depressive disorder, single episode, moderate: Secondary | ICD-10-CM | POA: Diagnosis not present

## 2021-12-28 DIAGNOSIS — F321 Major depressive disorder, single episode, moderate: Secondary | ICD-10-CM | POA: Diagnosis not present

## 2021-12-29 DIAGNOSIS — F321 Major depressive disorder, single episode, moderate: Secondary | ICD-10-CM | POA: Diagnosis not present

## 2021-12-30 DIAGNOSIS — F321 Major depressive disorder, single episode, moderate: Secondary | ICD-10-CM | POA: Diagnosis not present

## 2021-12-31 DIAGNOSIS — F321 Major depressive disorder, single episode, moderate: Secondary | ICD-10-CM | POA: Diagnosis not present

## 2022-01-01 DIAGNOSIS — F321 Major depressive disorder, single episode, moderate: Secondary | ICD-10-CM | POA: Diagnosis not present

## 2022-01-02 ENCOUNTER — Encounter
Payer: Medicaid Other | Attending: Physical Medicine and Rehabilitation | Admitting: Physical Medicine and Rehabilitation

## 2022-01-02 ENCOUNTER — Encounter: Payer: Self-pay | Admitting: Physical Medicine and Rehabilitation

## 2022-01-02 VITALS — BP 157/85 | HR 90 | Ht 61.0 in | Wt 264.0 lb

## 2022-01-02 DIAGNOSIS — Z789 Other specified health status: Secondary | ICD-10-CM | POA: Diagnosis present

## 2022-01-02 DIAGNOSIS — Z7409 Other reduced mobility: Secondary | ICD-10-CM | POA: Insufficient documentation

## 2022-01-02 DIAGNOSIS — F321 Major depressive disorder, single episode, moderate: Secondary | ICD-10-CM | POA: Diagnosis not present

## 2022-01-02 DIAGNOSIS — M170 Bilateral primary osteoarthritis of knee: Secondary | ICD-10-CM | POA: Diagnosis present

## 2022-01-02 NOTE — Patient Instructions (Signed)
Weight loss: ?-Consider [place] tea daily.  ?-Discussed the benefits of intermittent fasting. ?-Discussed foods that can assist in weight loss: ?1) leafy greens- high in fiber and nutrients ?2) dark chocolate- improves metabolism (if prefer sweetened, best to sweeten with honey instead of sugar).  ?3) cruciferous vegetables- high in fiber and protein ?4) full fat yogurt: high in healthy fat, protein, calcium, and probiotics ?5) apples- high in a variety of phytochemicals ?6) nuts- high in fiber and protein that increase feelings of fullness ?7) grapefruit: rich in nutrients, antioxidants, and fiber (not to be taken with anticoagulation) ?8) beans- high in protein and fiber ?9) salmon- has high quality protein and healthy fats ?10) green tea- rich in polyphenols ?11) eggs- rich in choline and vitamin D ?12) tuna- high protein, boosts metabolism ?13) avocado- decreases visceral abdominal fat ?14) chicken (pasture raised): high in protein and iron ?15) blueberries- reduce abdominal fat and cholesterol ?16) whole grains- decreases calories retained during digestion, speeds metabolism ?17) chia seeds- curb appetite ?18) chilies- increases fat metabolism  ?-Discussed supplements that can be used: ? 1) Metatrim '400mg'$  BID 30 minutes before breakfast and dinner ? 2) Sphaeranthus indicus and Garcinia mangostana (combinations of these and #1 can be found in capsicum and zychrome ? 3) green coffee bean extract '400mg'$  twice per day or Irvingia (african mango) 150 to '300mg'$  twice per day. ? ? ?

## 2022-01-03 DIAGNOSIS — F321 Major depressive disorder, single episode, moderate: Secondary | ICD-10-CM | POA: Diagnosis not present

## 2022-01-03 NOTE — Progress Notes (Signed)
Knee injection, bilateral ? ?Indication: Bilateral knee pain not relieved by medication management and other conservative care. ? ?Informed consent was obtained after describing risks and benefits of the procedure with the patient, this includes bleeding, bruising, infection and medication side effects. The patient wishes to proceed and has given written consent. The patient was placed in a recumbent position. The medial aspect of the knee was marked and prepped with Betadine and alcohol. It was then entered with a 25-gauge 1-1/2 inch needle and Zilretta injected into knee joint after negative draw back for blood. The procedure was repeated on the other side. The patient tolerated the procedure well. Post procedure instructions were given.  ? ?

## 2022-01-04 DIAGNOSIS — F321 Major depressive disorder, single episode, moderate: Secondary | ICD-10-CM | POA: Diagnosis not present

## 2022-01-05 ENCOUNTER — Encounter (HOSPITAL_BASED_OUTPATIENT_CLINIC_OR_DEPARTMENT_OTHER): Payer: Medicaid Other | Admitting: Physical Medicine and Rehabilitation

## 2022-01-05 DIAGNOSIS — Z7409 Other reduced mobility: Secondary | ICD-10-CM

## 2022-01-05 DIAGNOSIS — Z789 Other specified health status: Secondary | ICD-10-CM | POA: Diagnosis not present

## 2022-01-05 DIAGNOSIS — F321 Major depressive disorder, single episode, moderate: Secondary | ICD-10-CM | POA: Diagnosis not present

## 2022-01-05 NOTE — Progress Notes (Signed)
Subjective:    Patient ID: Caroline Ryan, female    DOB: 1966/05/18, 56 y.o.   MRN: [id number]  HPI  An audio/video tele-health visit is felt to be the most appropriate encounter for this patient at this time. This is a follow up tele-visit via phone. The patient is at home. MD is at office. Prior to scheduling this appointment, our staff discussed the limitations of evaluation and management by telemedicine and the availability of in-person appointments. The patient expressed understanding and agreed to proceed.   Caroline Ryan presents for f/u of impaired mobility secondary to bilateral knee pain and obesity.   1) Bilateral knee pain  -We discussed her MRI results previously and she would like to pursue arthroscopic debridement if surgeon feels this would be appropriate. Stairs worsen her pain- she uses rails and tries to minimize using the stairs. The other day her legs gave way.  -she would like to try the compounding cream  -Her pain has been well controlled, was a little sore after Zilretta injections but feeling a little better. She asked what exactly Zilretta injections contain  2) Hypertension BP 152/92 in office previously. It is usually 140/70. Diet has ben pretty good but she still struggles to lose weight.   The cream was too expensive for her at [facility]. She has been trying to walk. Her mood is a little more down today. The handicap sticker allows her to go out more.  She has been taking ibuprofen '800mg'$  and Gabapentin. She has tried Tylenol in the past and it does not help much. She would like to repeat Monovisc next visit in December.   3) Obesity Weight is 264 lbs, up 11 lbs from second to last visit.  -Last eats at 8pm -she is frustrated that [name] is not covered for her -Asks if I may write a letter regarding why [name] would be beneficial for her and I have done so, she asks that we mail this to her and she plans to talk with her insurance company.   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 She is unable to increase her hours due to her limited mobility from her knee pain. She asks about disability as she currently receives limited financial payment and it is difficult for her to pay her bills. She does not have much knowledge of computers to try for a computer-based work-from-home job.   Prior history: She is feeling very fatigued today. I have bene unable to get her into see the OBGYN as they did not accept her insurance.   Prior history:  Caroline Ryan is a 56 year old woman who presents with bilateral knee OA. Last visit we did bilateral knee viscosupplementation injections that has helped her so much.   She was helping her dad move last night.   Her BP is well controlled.   She is taking the Norco about twice per day. This is the only medication that helps to ease her pain.   She hopes to be able to do shopping and spend time with her daughter.   She is 257 lbs, same weight is last time.   She is walking more than last month.   She wants to try a juice diet and help get her weight off. She has been eating fruits and vegetables.  She has been noting sharp pains shooting down her legs  and arms. She felt weak when she had this pain. Her arm buckled when she had this pain.  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   Pain is 9/10 on average, and 7/10 right now.   She was diagnosed with fibroids and is stressed by the fact that she has not heard from the gynecologist yet.   She is also having hot flashes from menopause and asks what kind of treatments are available.   Pain Inventory Average Pain 8 Pain Right Now 8 My pain is sharp, burning and stabbing  In the last 24 hours, has pain interfered with the following? General activity 8 Relation with others 7 Enjoyment of life 9 What TIME of day is your pain at its worst?  daytime Sleep (in general) Fair  Pain is worse with: walking and sitting Pain improves with: rest, heat/ice, therapy/exercise, medication, TENS and injections Relief from Meds: 8  Family History  Problem Relation Age of Onset   Breast cancer Mother 36   Hypertension Father    Diabetes Brother    Diabetes Brother    Other Neg Hx    Colon polyps Neg Hx    Colon cancer Neg Hx    Esophageal cancer Neg Hx    Stomach cancer Neg Hx    Rectal cancer Neg Hx    Social History   Socioeconomic History   Marital status: Single    Spouse name: Not on file   Number of children: Not on file   Years of education: Not on file   Highest education level: Not on file  Occupational History   Not on file  Tobacco Use   Smoking status: Former    Packs/day: 0.25    Years: 2.00    Pack years: 0.50    Types: Cigarettes    Quit date: 08/20/2014    Years since quitting: 7.3   Smokeless tobacco: Never  Vaping Use   Vaping Use: Never used  Substance and Sexual Activity   Alcohol use: Yes    Alcohol/week: 2.0 standard drinks    Types: 2 Glasses of wine per week   Drug use: No   Sexual activity: Yes    Birth control/protection: Condom  Other Topics Concern   Not on file  Social History Narrative   Not on file   Social Determinants of Health   Financial Resource Strain: Not on file  Food Insecurity: Not on file  Transportation Needs: Not on file  Physical Activity: Not on file  Stress: Not on file  Social Connections: Not on file   Past Surgical History:  Procedure Laterality Date   ABDOMINAL HYSTERECTOMY     BILATERAL SALPINGECTOMY Bilateral 10/10/2017   Procedure: BILATERAL SALPINGECTOMY;  Surgeon: Chancy Milroy, MD;  Location: [facility];  Service: Gynecology;  Laterality: Bilateral;   BREAST BIOPSY     US guided core 2009   BREAST EXCISIONAL BIOPSY     right 1982 left [place]     bilateral cysts/benign   COLONOSCOPY  10/17/2021   VAGINAL HYSTERECTOMY N/A  10/10/2017   Procedure: HYSTERECTOMY VAGINAL;  Surgeon: Chancy Milroy, MD;  Location: [facility];  Service: Gynecology;  Laterality: N/A;   Past Surgical History:  Procedure Laterality Date   ABDOMINAL HYSTERECTOMY     BILATERAL SALPINGECTOMY Bilateral 10/10/2017   Procedure: BILATERAL SALPINGECTOMY;  Surgeon: Chancy Milroy, MD;  Location: [facility];  Service: Gynecology;  Laterality: Bilateral;   BREAST BIOPSY  US guided core 2009   BREAST EXCISIONAL BIOPSY     right 1982 left [place]     bilateral cysts/benign   COLONOSCOPY  10/17/2021   VAGINAL HYSTERECTOMY N/A 10/10/2017   Procedure: HYSTERECTOMY VAGINAL;  Surgeon: Chancy Milroy, MD;  Location: [facility];  Service: Gynecology;  Laterality: N/A;   Past Medical History:  Diagnosis Date   Anemia    Asthma    Depression    no meds   Dilated aortic root ([place])    43m by echo 07/2021   Eczema    Fibroid    Headache(784.0)    otc prn med   History of blood transfusion    Hypertension    Knee pain, bilateral    arthralgia knee pain bilateral   Lower extremity edema    Mitral regurgitation    Mild to moderate by echo 07/2021   Seasonal allergies    SVD (spontaneous vaginal delivery)    x 3   Vaginal polyp 05/2020   LMP 04/03/2017 (Approximate)   Opioid Risk Score:   Fall Risk Score:  `1  Depression screen [facility]2/9     01/02/2022    1:45 PM 11/23/2021   11:06 AM 08/15/2021   11:09 AM 04/25/2021   10:04 AM 02/06/2021    9:16 AM 08/09/2020    1:58 PM 05/20/2020   11:54 AM  Depression screen PHQ 2/9  Decreased Interest 0 0 0 '2 1 2 '$ 0  Down, Depressed, Hopeless 0 0 0 '2 1 2 1  '$ PHQ - 2 Score 0 0 0 '4 2 4 1  '$ Altered sleeping  0    2   Tired, decreased energy  0    1   Change in appetite  0    1   Feeling bad or failure about yourself   0    1   Trouble concentrating  0    0   Moving slowly or fidgety/restless  0    0   Suicidal thoughts  0    0   PHQ-9 Score  0    9     Review of Systems   Constitutional: Negative.   HENT: Negative.    Eyes: Negative.   Respiratory: Negative.    Cardiovascular: Negative.   Gastrointestinal: Negative.   Endocrine: Negative.   Genitourinary: Negative.   Musculoskeletal:  Positive for arthralgias and gait problem.  Skin: Negative.   Allergic/Immunologic: Negative.   Hematological: Negative.   Psychiatric/Behavioral: Negative.    All other systems reviewed and are negative.     Objective:   Physical Exam Seen via phone.     Assessment & Plan:  Caroline Ryan a 56year old woman presenting to establish care for bilateral chronic knee pain, neck pain.   1) Bilateral knee OA: -XRs reviewed and consistent with OA -Reviewed MRI results which show bilaterals severe osteoarthritis which is worst in the medial compartment, as well as severe bilateral cartilage damage. Discussed that arthroscopic debridement may be an option for her since she has been asked to lose weight prior to TKA and this has been difficult for her.  -She received steroid injections q330month -She had viscosupplementation injections in July with excellent benefits.  -Vitamin D level normal.  -Recommended blue emu oil -Discussed her walking.Made goal to increase to 15 minutes at least 4 times per week.  -Discussed that every pound she loses is 6 lbs off her knees.  -Increase Gabapentin to  $'600mg'G$  TID. Start at night and don't drive initially after starting higher dose as could make her very sleepy. Educated that Gabapentin can help with her hot flashes.  -continue to work on weight loss -Zilretta bilateral as soon as available.  -Called in compounding cream of Diclofenac 3%, Gabapentin 5%, Lidocaine 5%, Menthol 1% compounded at [facility]:  apply 1-2 pumps three to four times per day as needed    2) Muscle cramps:magneisum level was normal last visit.   3) Morbid obesity: Weight is 259, 2 lbs up from last visit. Discussed intermittent fasting and low carb diet.  Continue healthy diet. Made goal to start eating at 11:15am instead of 11am and to move dinner time from 8:30pm to 8:15pm. She has moved this to 8pm, discussed trying to move to 7:45pm.  HgbA1C 5.2- discussed that there is no prediabetes -referred to bariatric surgery -wrote letter for her explaining why she would benefit from [facility]  4) General health -Reviewed to gynecology for pap smear -Reviewed lipids and were normal last year.   5) Radicular symptoms in upper and lower extremities: -no evidence of weakness on exam to suggest myelopathy -Cervical XR results reviewed with her: they show mild spondylosis at C5-6 and C6-7.  No acute fracture. Lumbar XR reviewed with patient and shows 1. No acute compression fracture. 2. A 7 mm anterolisthesis of L4 on L5, presumably degenerative in etiology. 3. Multilevel degenerative disc disease and facet arthrosis. -Patient worried about heart attack- advised that symptoms appear to be more radicular and are not accompanied by chest pain.   6) Palpitations:  -She has had occasional episodes -Denies chest pain. She gets SOB due to her asthma.   7) Iron deficiency anemia: Iron was very low in 2018- she has a history of menorrhagia and so she could continue to have anemia. Hemoglobin was within normal limits when checked in May. Discussed iron rich foods as she prefers no supplement at this time.   8) hypertension: Start clonodine patch 0.'1mg'$ .   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 -discussed that she was previously denies disability and that she is impaired in her ability to walk and stand due to her knee pain -discussed that she should look for work that requires upper body strength or cognition -discussed that I can complete disability paperwork for her addressing her current limitations -encouraged continued lifestyle changes to help reduce weight and knee pain  5  minutes spent in discussing her current limitations, response to Zilretta, limited finances 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

## 2022-01-06 DIAGNOSIS — F321 Major depressive disorder, single episode, moderate: Secondary | ICD-10-CM | POA: Diagnosis not present

## 2022-01-08 DIAGNOSIS — F321 Major depressive disorder, single episode, moderate: Secondary | ICD-10-CM | POA: Diagnosis not present

## 2022-01-09 DIAGNOSIS — F321 Major depressive disorder, single episode, moderate: Secondary | ICD-10-CM | POA: Diagnosis not present

## 2022-01-10 DIAGNOSIS — F321 Major depressive disorder, single episode, moderate: Secondary | ICD-10-CM | POA: Diagnosis not present

## 2022-01-10 NOTE — Progress Notes (Unsigned)
Patient ID: Caroline Ryan [facility]                 DOB: 1966/07/30                      MRN: [id number]     HPI: Caroline Ryan is a 56 y.o. female referred by Dr. Radford Pax to HTN clinic. PMH is significant for asthma, HTN, obesity, and bilateral knee pain. Pt seen by Dr. Radford Pax on 05/30/21 for LE edema. HCTZ was stopped as pt was already taking Lasix, and lisinopril dose was increased from '20mg'$  to '40mg'$  daily due to elevated BP of 160/98. On 06/07/21, BP remained elevated at 144/100 and she was started on spironolactone '25mg'$  daily (avoided amlodipine since pt already reported LE edema, avoided thiazide since pt already on loop diuretic for her edema). She had a virtual visit with sports med the next day, BP was not assessed, she was started on a clonidine patch. She does take ibuprofen 800 mg 1-3 tablets daily, Norco BID, and voltaren gel as Tylenol and gabapentin don't work for osteoarthritis in her knees.   On 07/10/21 PharmD appt [name]), spironolactone was increase to 50 mg daily as home/clinic BP readings above goal. At PharmD f/u on 08/03/21 pt was out of some medications, so adherence to regimen of lisinopril 40 mg daily, spironolactone 50 mg daily, and clonidine patch was emphasized. At visit with Dr. Radford Pax on 09/07/21 BP was still elevated to 170/100, stopped lisinopril and started olmesartan and carvedilol 6.25 mg BID. Had f/u with PharmD one week later on 09/11/21 where BMP was stable. Attempted to call pt with instructions to increase spironolactone to 100 mg daily, but unable to reach her. At most recent visit with Dr. Radford Pax on 12/26/21, pt did not have LEE on exam. Was instructed to continue furosemide 20 mg daily. Her BP was 142/92. Instructed to continue olmesartan 40 mg and clonidine patch, and increase carvedilol to 12.5 mg BID. Also planning to start pt on CPAP for further BP control, but pt needs to contact DME supplier.   Pt presents today for HTN management.   Notes:  - confirm BP  regimen - home readings? Previously validated cuff in clinic (band was correct size) - replacing clonidine patch weekly?  - NSAID use update?  - clean up med list-> assume [name] was not covered with medicaid - review diet and exercise - any rehab appt lately? Previously encouraged to continue water aerobics.  OLD HPI:  Pt presents to clinic today to assess medication changes. She endorses still having pain in her knees. She is doing rehab at [facility] but missed her appt and hasn't been able to reschedule. She is working to lower amount of salt/sweets/snack foods she eats. She is working to fast at night, not eating after 9 PM. Endorsed wanting to lose weight to help pain in knees, but is aware that GLP1ra will be too expensive. She endorses adherence to all blood pressure medications and no new side effects. She took all her blood pressure medications this morning. She said she has not missed any doses this week. Said she previously was using clonidine wrong because she thought it was one week on, one week off, etc, but now understands to simply replace it weekly. She is checking her blood pressure at home twice daily. She brought her blood pressure cuff into the visit today. It was measuring about 3-9 mmHg different than the clinic cuff. Band was correct size.  Current HTN meds: olmesartan 40 mg daily, carvedilol 12.5 mg BID,  spironolactone '50mg'$  daily, clonidine patch 0.'1mg'$  weekly, furosemide 20 mg daily Previously tried: lisinopril-hctz, lisinopril '40mg'$  daily BP goal: <130/92mHg  Family History: The patient's family history includes Breast cancer (age of onset: 795 in her mother; Diabetes in her brother and brother; Hypertension in her father.   Social History:  Reports that she quit smoking about 6 years ago. Her smoking use included cigarettes. She has a 0.50 pack-year smoking history. She has never used smokeless tobacco. She reports current alcohol use of about 2.0 standard drinks per  week. She reports that she does not use drugs.  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 She has poor mobility due to her knee pain, weight is currently too high for surgery, recently doing water aerobics  Home Blood Pressure: 144/94, HR 99 167/69, HR 92 120/66, HR 101 183/76, HR 63 172/81, HR 73  Home Cuff (in-clinic): 153/82 (HR 108), 147/100 (HR 84)  Clinic Cuff: 156/96, 158/98, (HR 95) 156/96   Wt Readings from Last 3 Encounters:  07/10/21 244 lb (110.7 kg)  06/26/21 258 lb (117 kg)  05/30/21 257 lb 3.2 oz (116.7 kg)   BP Readings from Last 3 Encounters:  07/10/21 (!) 146/100  06/07/21 (!) 144/100  05/30/21 (!) 160/98   Pulse Readings from Last 3 Encounters:  07/10/21 87  06/07/21 84  05/30/21 96    Renal function: CrCl cannot be calculated (Patient's most recent lab result is older than the maximum 21 days allowed.).  Past Medical History:  Diagnosis Date   Anemia    Asthma    Depression    no meds   Eczema    Fibroid    Headache(784.0)    otc prn med   History of blood transfusion    Hypertension    Knee pain, bilateral    arthralgia knee pain bilateral   Seasonal allergies    SVD (spontaneous vaginal delivery)    x 3   Vaginal polyp 05/2020    Current Outpatient Medications on File Prior to Visit  Medication Sig Dispense Refill   acetaminophen (TYLENOL) 500 MG tablet Take 2 tablets (1,000 mg total) by mouth 2 (two) times daily as needed. 120 tablet 11   albuterol (PROAIR HFA) 108 (90 Base) MCG/ACT inhaler INHALE 1 PUFF EVERY 6 HOURS AS NEEDED FOR SHORTNESS OF BREATH 8.5 g 5   cetirizine (ZYRTEC) 10 MG  tablet Take 1 tablet (10 mg total) by mouth daily. 30 tablet 6   cloNIDine (CATAPRES - DOSED IN MG/24 HR) 0.1 mg/24hr patch Place 1 patch (0.1 mg total) onto the skin once a week. 4 patch 12   diclofenac Sodium (VOLTAREN) 1 % GEL Apply 4 g topically 4 (four) times daily. 100 g 3   docusate sodium (COLACE) 100 MG capsule Take 1 capsule (100 mg total) by mouth 2 (two) times daily as needed. 30 capsule 2   furosemide (LASIX) 20 MG tablet Take 1 tablet (20 mg total) by mouth daily. 30 tablet 6   gabapentin (NEURONTIN) 300 MG capsule TAKE 1 CAPSULE (300 MG  TOTAL) BY MOUTH 3 (THREE) TIMES DAILY. 90 capsule 6   hydrocortisone 2.5 % cream Apply topically 2 (two) times daily. 30 g 1   hydrOXYzine (ATARAX/VISTARIL) 25 MG tablet Take 1 tablet (25 mg total) by mouth 3 (three) times daily as needed. 30 tablet 11   ibuprofen (ADVIL) 800 MG tablet Take 1 tablet (800 mg total) by mouth 3 (three) times daily. 30 tablet 6   lisinopril (ZESTRIL) 40 MG tablet Take 1 tablet (40 mg total) by mouth daily. 90 tablet 3   montelukast (SINGULAIR) 10 MG tablet TAKE 1 TABLET (10 MG TOTAL) BY MOUTH AT BEDTIME. 90 tablet 6   NONFORMULARY OR COMPOUNDED ITEM Diclofenac 3%, Gabapentin 5%, Lidocaine 5%, Menthol 1%.  Apply 1-2 pumps three to four times per day as needed 1 each 0   spironolactone (ALDACTONE) 50 MG tablet Take 1 tablet (50 mg total) by mouth daily. 30 tablet 5   No current facility-administered medications on file prior to visit.    No Known Allergies  Last menstrual period 04/03/2017.   Assessment/Plan:  1. Hypertension -    Patient seen with Park Liter, PY4 PharmD Candidate  Ramond Dial, Mathews.D, BCPS, CPP [place]  [address], [place], [place] [address]  Phone: [phone]; Fax: [phone]  08/03/2021 8:05 AM

## 2022-01-11 ENCOUNTER — Ambulatory Visit (INDEPENDENT_AMBULATORY_CARE_PROVIDER_SITE_OTHER): Payer: Medicaid Other | Admitting: Pharmacist

## 2022-01-11 ENCOUNTER — Other Ambulatory Visit: Payer: Self-pay

## 2022-01-11 VITALS — BP 158/98 | HR 77

## 2022-01-11 DIAGNOSIS — I10 Essential (primary) hypertension: Secondary | ICD-10-CM

## 2022-01-11 DIAGNOSIS — F321 Major depressive disorder, single episode, moderate: Secondary | ICD-10-CM | POA: Diagnosis not present

## 2022-01-11 MED ORDER — CARVEDILOL 25 MG PO TABS
25.0000 mg | ORAL_TABLET | Freq: Two times a day (BID) | ORAL | 3 refills | Status: DC
  Filled 2022-01-11: qty 180, 90d supply, fill #0
  Filled 2022-04-06 – 2022-07-04 (×2): qty 180, 90d supply, fill #1
  Filled 2022-10-11 (×2): qty 180, 90d supply, fill #2

## 2022-01-11 MED ORDER — SPIRONOLACTONE 100 MG PO TABS
100.0000 mg | ORAL_TABLET | Freq: Every day | ORAL | 3 refills | Status: DC
  Filled 2022-01-11: qty 90, 90d supply, fill #0
  Filled 2022-04-06: qty 90, 90d supply, fill #1
  Filled 2022-07-04: qty 50, 50d supply, fill #1
  Filled 2022-07-04: qty 15, 15d supply, fill #1
  Filled 2022-07-04: qty 75, 75d supply, fill #1
  Filled 2022-07-04: qty 40, 40d supply, fill #1
  Filled 2022-09-20: qty 90, 90d supply, fill #2

## 2022-01-11 NOTE — Patient Instructions (Addendum)
Your blood pressure goal is < 130/84mHg. Your blood pressure today was 158/98 mmHg. You can recheck your resting blood pressure later this afternoon if you would like. Ask the pharmacy for refills of any medications you are running low on.   Increase your spironolactone from '50mg'$  to '100mg'$  daily  Increase your carvedilol from 12.'5mg'$  to '25mg'$  twice daily  Continue taking your other medications and monitor/record your blood pressure at home: olmesartan 40 mg daily, clonidine 0.1 mg patch weekly.   Please bring a log of your home blood pressure readings to your next appointment.

## 2022-01-12 DIAGNOSIS — F321 Major depressive disorder, single episode, moderate: Secondary | ICD-10-CM | POA: Diagnosis not present

## 2022-01-15 DIAGNOSIS — F321 Major depressive disorder, single episode, moderate: Secondary | ICD-10-CM | POA: Diagnosis not present

## 2022-01-16 ENCOUNTER — Other Ambulatory Visit: Payer: Self-pay | Admitting: Nurse Practitioner

## 2022-01-16 ENCOUNTER — Other Ambulatory Visit: Payer: Self-pay

## 2022-01-16 DIAGNOSIS — F321 Major depressive disorder, single episode, moderate: Secondary | ICD-10-CM | POA: Diagnosis not present

## 2022-01-16 DIAGNOSIS — L309 Dermatitis, unspecified: Secondary | ICD-10-CM

## 2022-01-17 DIAGNOSIS — F321 Major depressive disorder, single episode, moderate: Secondary | ICD-10-CM | POA: Diagnosis not present

## 2022-01-18 ENCOUNTER — Other Ambulatory Visit: Payer: Self-pay

## 2022-01-18 DIAGNOSIS — F321 Major depressive disorder, single episode, moderate: Secondary | ICD-10-CM | POA: Diagnosis not present

## 2022-01-19 ENCOUNTER — Other Ambulatory Visit: Payer: Self-pay

## 2022-01-19 DIAGNOSIS — F321 Major depressive disorder, single episode, moderate: Secondary | ICD-10-CM | POA: Diagnosis not present

## 2022-01-20 DIAGNOSIS — F321 Major depressive disorder, single episode, moderate: Secondary | ICD-10-CM | POA: Diagnosis not present

## 2022-01-22 ENCOUNTER — Other Ambulatory Visit: Payer: Self-pay

## 2022-01-22 DIAGNOSIS — G4733 Obstructive sleep apnea (adult) (pediatric): Secondary | ICD-10-CM | POA: Diagnosis not present

## 2022-01-22 DIAGNOSIS — F321 Major depressive disorder, single episode, moderate: Secondary | ICD-10-CM | POA: Diagnosis not present

## 2022-01-22 MED ORDER — HYDROCORTISONE 2.5 % EX CREA
TOPICAL_CREAM | Freq: Two times a day (BID) | CUTANEOUS | 1 refills | Status: DC
  Filled 2022-01-22: qty 30, 15d supply, fill #0

## 2022-01-23 DIAGNOSIS — F321 Major depressive disorder, single episode, moderate: Secondary | ICD-10-CM | POA: Diagnosis not present

## 2022-01-24 DIAGNOSIS — F321 Major depressive disorder, single episode, moderate: Secondary | ICD-10-CM | POA: Diagnosis not present

## 2022-01-25 DIAGNOSIS — F321 Major depressive disorder, single episode, moderate: Secondary | ICD-10-CM | POA: Diagnosis not present

## 2022-01-26 DIAGNOSIS — F321 Major depressive disorder, single episode, moderate: Secondary | ICD-10-CM | POA: Diagnosis not present

## 2022-01-29 ENCOUNTER — Other Ambulatory Visit: Payer: Self-pay

## 2022-01-29 DIAGNOSIS — F321 Major depressive disorder, single episode, moderate: Secondary | ICD-10-CM | POA: Diagnosis not present

## 2022-01-30 DIAGNOSIS — F321 Major depressive disorder, single episode, moderate: Secondary | ICD-10-CM | POA: Diagnosis not present

## 2022-01-31 DIAGNOSIS — F321 Major depressive disorder, single episode, moderate: Secondary | ICD-10-CM | POA: Diagnosis not present

## 2022-02-01 DIAGNOSIS — F321 Major depressive disorder, single episode, moderate: Secondary | ICD-10-CM | POA: Diagnosis not present

## 2022-02-02 DIAGNOSIS — F321 Major depressive disorder, single episode, moderate: Secondary | ICD-10-CM | POA: Diagnosis not present

## 2022-02-03 DIAGNOSIS — F321 Major depressive disorder, single episode, moderate: Secondary | ICD-10-CM | POA: Diagnosis not present

## 2022-02-05 DIAGNOSIS — F321 Major depressive disorder, single episode, moderate: Secondary | ICD-10-CM | POA: Diagnosis not present

## 2022-02-06 DIAGNOSIS — F321 Major depressive disorder, single episode, moderate: Secondary | ICD-10-CM | POA: Diagnosis not present

## 2022-02-07 DIAGNOSIS — F321 Major depressive disorder, single episode, moderate: Secondary | ICD-10-CM | POA: Diagnosis not present

## 2022-02-08 ENCOUNTER — Ambulatory Visit (INDEPENDENT_AMBULATORY_CARE_PROVIDER_SITE_OTHER): Payer: Medicaid Other | Admitting: Pharmacist

## 2022-02-08 VITALS — BP 118/84 | HR 79

## 2022-02-08 DIAGNOSIS — I10 Essential (primary) hypertension: Secondary | ICD-10-CM | POA: Diagnosis not present

## 2022-02-08 DIAGNOSIS — F321 Major depressive disorder, single episode, moderate: Secondary | ICD-10-CM | POA: Diagnosis not present

## 2022-02-08 LAB — BASIC METABOLIC PANEL
BUN/Creatinine Ratio: 29 — ABNORMAL HIGH (ref 9–23)
BUN: 26 mg/dL — ABNORMAL HIGH (ref 6–24)
CO2: 26 mmol/L (ref 20–29)
Calcium: 9.8 mg/dL (ref 8.7–10.2)
Chloride: 101 mmol/L (ref 96–106)
Creatinine, Ser: 0.9 mg/dL (ref 0.57–1.00)
Glucose: 119 mg/dL — ABNORMAL HIGH (ref 70–99)
Potassium: 4.9 mmol/L (ref 3.5–5.2)
Sodium: 137 mmol/L (ref 134–144)
eGFR: 74 mL/min/{1.73_m2} (ref >59)

## 2022-02-08 NOTE — Progress Notes (Signed)
Patient ID: Vickye Golen [facility]                 DOB: 03/28/1966                      MRN: [id number]     HPI: Caroline Ryan is a 56 y.o. female referred by Dr. Mayford Knife to HTN clinic. PMH is significant for asthma, HTN, obesity, and bilateral knee pain. Pt seen by Dr. Mayford Knife on 05/30/21 for LE edema. HCTZ was stopped as pt was already taking Lasix, and lisinopril dose was increased from 20mg  to 40mg  daily due to elevated BP of 160/98. On 06/07/21, BP remained elevated at 144/100 and she was started on spironolactone 25mg  daily (avoided amlodipine since pt already reported LE edema, avoided thiazide since pt already on loop diuretic for her edema). She had a virtual visit with sports med the next day, BP was not assessed but she was started on a clonidine patch. She does take ibuprofen 800 mg 1-3 tablets daily, Norco BID, and Voltaren gel as Tylenol and gabapentin don't work for osteoarthritis in her knees.   On 07/10/21 PharmD appt with Efraim Kaufmann, spironolactone was increase to 50 mg daily as home/clinic BP readings remained above goal. At PharmD f/u on 08/03/21 pt was out of some medications, so adherence to regimen of lisinopril 40 mg daily, spironolactone 50 mg daily, and clonidine patch was emphasized. At visit with Dr. Mayford Knife on 09/07/21, BP was still elevated to 170/100, stopped lisinopril and started olmesartan and carvedilol 6.25 mg BID. Had f/u with PharmD one week later on 09/11/21 where BMP was stable. Attempted to call pt 3x with instructions to increase spironolactone to 100 mg daily, but unable to reach her. At most recent visit with Dr. Mayford Knife on 12/26/21, pt did not have LEE on exam. Was instructed to continue furosemide 20 mg daily. Her BP was 142/92. Instructed to continue olmesartan 40 mg and clonidine patch, and increase carvedilol to 12.5 mg BID. Also planning to start pt on CPAP for further BP control, but pt needs to contact DME supplier. Most recently seen by PharmD 5/25 where BP  remained elevated at 158/98. Spironolactone was increased to 100 mg daily and carvedilol was increased to 25 mg BID.  Pt presents today for follow up. Reports tolerating her medications well. Received an injection in her knee from PCP which has helped reduce pain and improve mobility. She has been staying more active as a result and has tried to decrease use of NSAIDs, taking ibuprofen ~1x/day now. Received CPAP but it doesn't fit well. Can wear for about an hour or so. Waiting for a call back on resuming water aerobics. Reports eating better as well, more shakes and fruit. Denies dizziness and headache, minimal swelling (stable and not bothersome).  Current HTN meds: olmesartan 40mg  daily, carvedilol 25mg  BID,  spironolactone 100mg  daily, clonidine patch 0.1mg  weekly, furosemide 20mg  daily Previously tried: lisinopril-hctz, lisinopril 40mg  daily BP goal: <130/53mmHg  Family History: The patient's family history includes Breast cancer (age of onset: 29) in her mother; Diabetes in her brother and brother; Hypertension in her father.   Social History:  Reports that she quit smoking about 6 years ago. Her smoking use included cigarettes. She has a 0.50 pack-year smoking history. She has never used smokeless tobacco. She reports current alcohol use of about 2.0 standard drinks per week. She reports that she does not use drugs.  Diet: Eating better recently since son has been  helping with diet. Making juice/smoothies: cucumbers, vegetables, berries, peaches, lemon, juice, honey, ginger. Drinks 1 cup in the AM and PM.  - Not adding extra salt to food, being careful with blends. Uses Mrs. Dash, non-salt seasonings.  - lunch: tuna fish sandwich - dinner: piece of meat - Does not drink coffee or soda - only water and tea  Exercise: Works as a Engineer, structural. She has poor mobility due to her knee pain, weight is currently too high for surgery. Exercise is mostly walking during the day with normal activities.    Wt Readings from Last 3 Encounters:  07/10/21 244 lb (110.7 kg)  06/26/21 258 lb (117 kg)  05/30/21 257 lb 3.2 oz (116.7 kg)   BP Readings from Last 3 Encounters:  07/10/21 (!) 146/100  06/07/21 (!) 144/100  05/30/21 (!) 160/98   Pulse Readings from Last 3 Encounters:  07/10/21 87  06/07/21 84  05/30/21 96    Renal function: CrCl cannot be calculated (Patient's most recent lab result is older than the maximum 21 days allowed.).  Past Medical History:  Diagnosis Date   Anemia    Asthma    Depression    no meds   Eczema    Fibroid    Headache(784.0)    otc prn med   History of blood transfusion    Hypertension    Knee pain, bilateral    arthralgia knee pain bilateral   Seasonal allergies    SVD (spontaneous vaginal delivery)    x 3   Vaginal polyp 05/2020    Current Outpatient Medications on File Prior to Visit  Medication Sig Dispense Refill   acetaminophen (TYLENOL) 500 MG tablet Take 2 tablets (1,000 mg total) by mouth 2 (two) times daily as needed. 120 tablet 11   albuterol (PROAIR HFA) 108 (90 Base) MCG/ACT inhaler INHALE 1 PUFF EVERY 6 HOURS AS NEEDED FOR SHORTNESS OF BREATH 8.5 g 5   cetirizine (ZYRTEC) 10 MG tablet Take 1 tablet (10 mg total) by mouth daily. 30 tablet 6   cloNIDine (CATAPRES - DOSED IN MG/24 HR) 0.1 mg/24hr patch Place 1 patch (0.1 mg total) onto the skin once a week. 4 patch 12   diclofenac Sodium (VOLTAREN) 1 % GEL Apply 4 g topically 4 (four) times daily. 100 g 3   docusate sodium (COLACE) 100 MG capsule Take 1 capsule (100 mg total) by mouth 2 (two) times daily as needed. 30 capsule 2   furosemide (LASIX) 20 MG tablet Take 1 tablet (20 mg total) by mouth daily. 30 tablet 6   gabapentin (NEURONTIN) 300 MG capsule TAKE 1 CAPSULE (300 MG TOTAL) BY MOUTH 3 (THREE) TIMES DAILY. 90 capsule 6   hydrocortisone 2.5 % cream Apply topically 2 (two) times daily. 30 g 1   hydrOXYzine (ATARAX/VISTARIL) 25 MG tablet Take 1 tablet (25 mg total) by  mouth 3 (three) times daily as needed. 30 tablet 11   ibuprofen (ADVIL) 800 MG tablet Take 1 tablet (800 mg total) by mouth 3 (three) times daily. 30 tablet 6   lisinopril (ZESTRIL) 40 MG tablet Take 1 tablet (40 mg total) by mouth daily. 90 tablet 3   montelukast (SINGULAIR) 10 MG tablet TAKE 1 TABLET (10 MG TOTAL) BY MOUTH AT BEDTIME. 90 tablet 6   NONFORMULARY OR COMPOUNDED ITEM Diclofenac 3%, Gabapentin 5%, Lidocaine 5%, Menthol 1%.  Apply 1-2 pumps three to four times per day as needed 1 each 0   spironolactone (ALDACTONE) 50 MG tablet Take 1  tablet (50 mg total) by mouth daily. 30 tablet 5   No current facility-administered medications on file prior to visit.    No Known Allergies  Last menstrual period 04/03/2017.   Assessment/Plan:  1. Hypertension - Clinic BP much improved and now very close to goal <130/80 mmHg. Will check BMET today with recent dose increase of spironolactone. Will continue spironolactone 100 mg daily, carvedilol 25 mg BID, olmesartan 40mg  daily, clonidine patch 0.1mg  weekly, and furosemide 20mg  daily. Encouraged patient to continue eating healthier and working towards weight loss. Encouraged increased physical activity outside of daily walking, increasing moderate intensity exercise working towards a goal of 150 minutes/week. She'll continue to limit NSAIDs as able. Advised her to reach out to company about refitting her CPAP, and to water aerobics program as well F/u with PharmD as needed.   Zillah Alexie E. Khalis Hittle, PharmD, BCACP, CPP [facility] [address] [place], [place] [address] Phone: [phone]; Fax: [phone] 02/08/2022 8:32 AM

## 2022-02-08 NOTE — Patient Instructions (Signed)
It was nice to see you today!  Your blood pressure is looking much better today, your goal is < 130/83mHg  Continue taking your current medications  Reach out about your CPAP machine and water aerobics

## 2022-02-09 DIAGNOSIS — F321 Major depressive disorder, single episode, moderate: Secondary | ICD-10-CM | POA: Diagnosis not present

## 2022-02-10 DIAGNOSIS — F321 Major depressive disorder, single episode, moderate: Secondary | ICD-10-CM | POA: Diagnosis not present

## 2022-02-12 DIAGNOSIS — F321 Major depressive disorder, single episode, moderate: Secondary | ICD-10-CM | POA: Diagnosis not present

## 2022-02-13 ENCOUNTER — Other Ambulatory Visit: Payer: Self-pay

## 2022-02-13 DIAGNOSIS — F321 Major depressive disorder, single episode, moderate: Secondary | ICD-10-CM | POA: Diagnosis not present

## 2022-02-14 DIAGNOSIS — F321 Major depressive disorder, single episode, moderate: Secondary | ICD-10-CM | POA: Diagnosis not present

## 2022-02-15 ENCOUNTER — Other Ambulatory Visit: Payer: Self-pay

## 2022-02-15 DIAGNOSIS — F321 Major depressive disorder, single episode, moderate: Secondary | ICD-10-CM | POA: Diagnosis not present

## 2022-02-16 ENCOUNTER — Other Ambulatory Visit: Payer: Self-pay

## 2022-02-16 DIAGNOSIS — F321 Major depressive disorder, single episode, moderate: Secondary | ICD-10-CM | POA: Diagnosis not present

## 2022-02-17 DIAGNOSIS — F321 Major depressive disorder, single episode, moderate: Secondary | ICD-10-CM | POA: Diagnosis not present

## 2022-02-19 DIAGNOSIS — F321 Major depressive disorder, single episode, moderate: Secondary | ICD-10-CM | POA: Diagnosis not present

## 2022-02-20 DIAGNOSIS — F321 Major depressive disorder, single episode, moderate: Secondary | ICD-10-CM | POA: Diagnosis not present

## 2022-02-21 DIAGNOSIS — G4733 Obstructive sleep apnea (adult) (pediatric): Secondary | ICD-10-CM | POA: Diagnosis not present

## 2022-02-21 DIAGNOSIS — F321 Major depressive disorder, single episode, moderate: Secondary | ICD-10-CM | POA: Diagnosis not present

## 2022-02-22 DIAGNOSIS — F321 Major depressive disorder, single episode, moderate: Secondary | ICD-10-CM | POA: Diagnosis not present

## 2022-02-23 DIAGNOSIS — F321 Major depressive disorder, single episode, moderate: Secondary | ICD-10-CM | POA: Diagnosis not present

## 2022-02-24 DIAGNOSIS — F321 Major depressive disorder, single episode, moderate: Secondary | ICD-10-CM | POA: Diagnosis not present

## 2022-02-26 DIAGNOSIS — F321 Major depressive disorder, single episode, moderate: Secondary | ICD-10-CM | POA: Diagnosis not present

## 2022-02-27 DIAGNOSIS — F321 Major depressive disorder, single episode, moderate: Secondary | ICD-10-CM | POA: Diagnosis not present

## 2022-02-28 DIAGNOSIS — F321 Major depressive disorder, single episode, moderate: Secondary | ICD-10-CM | POA: Diagnosis not present

## 2022-03-01 DIAGNOSIS — F321 Major depressive disorder, single episode, moderate: Secondary | ICD-10-CM | POA: Diagnosis not present

## 2022-03-02 DIAGNOSIS — F321 Major depressive disorder, single episode, moderate: Secondary | ICD-10-CM | POA: Diagnosis not present

## 2022-03-03 DIAGNOSIS — F321 Major depressive disorder, single episode, moderate: Secondary | ICD-10-CM | POA: Diagnosis not present

## 2022-03-05 DIAGNOSIS — F321 Major depressive disorder, single episode, moderate: Secondary | ICD-10-CM | POA: Diagnosis not present

## 2022-03-06 ENCOUNTER — Encounter
Payer: Medicaid Other | Attending: Physical Medicine and Rehabilitation | Admitting: Physical Medicine and Rehabilitation

## 2022-03-06 DIAGNOSIS — M170 Bilateral primary osteoarthritis of knee: Secondary | ICD-10-CM | POA: Insufficient documentation

## 2022-03-06 DIAGNOSIS — F321 Major depressive disorder, single episode, moderate: Secondary | ICD-10-CM | POA: Diagnosis not present

## 2022-03-07 DIAGNOSIS — F321 Major depressive disorder, single episode, moderate: Secondary | ICD-10-CM | POA: Diagnosis not present

## 2022-03-07 NOTE — Progress Notes (Signed)
Subjective:    Patient ID: Caroline Ryan, female    DOB: 02/16/1966, 56 y.o.   MRN: [id number]  HPI  An audio/video tele-health visit is felt to be the most appropriate encounter for this patient at this time. This is a follow up tele-visit via phone. The patient is at home. MD is at office. Prior to scheduling this appointment, our staff discussed the limitations of evaluation and management by telemedicine and the availability of in-person appointments. The patient expressed understanding and agreed to proceed.   Caroline Ryan presents for f/u of impaired mobility secondary to bilateral knee pain and obesity.   1) Bilateral knee pain  -We discussed her MRI results previously and she would like to pursue arthroscopic debridement if surgeon feels this would be appropriate. Stairs worsen her pain- she uses rails and tries to minimize using the stairs. The other day her legs gave way.  -she would like to try the compounding cream -she would like repeat steroid injection as she is going to be going on vacation during the first week of August and would like to be as active as possible then  -Her pain has been well controlled, was a little sore after Zilretta injections but feeling a little better. She asked what exactly Zilretta injections contain. She had good response to prior Zilretta injection in May  2) Hypertension BP 152/92 in office previously. It is usually 140/70. Diet has ben pretty good but she still struggles to lose weight.   The cream was too expensive for her at [facility]. She has been trying to walk. Her mood is a little more down today. The handicap sticker allows her to go out more.  She has been taking ibuprofen '800mg'$  and Gabapentin. She has tried Tylenol in the past and it does not help much. She would like to repeat Monovisc next visit in December.   3) Obesity Weight is 264 lbs, up 11 lbs from second to last visit.  -Last eats at 8pm -she is frustrated that  [name] is not covered for her -Asks if I may write a letter regarding why [name] would be beneficial for her and I have done so, she asks that we mail this to her and she plans to talk with her insurance company.   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 She is unable to increase her hours due to her limited mobility from her knee pain. She asks about disability as she currently receives limited financial payment and it is difficult for her to pay her bills. She does not have much knowledge of computers to try for a computer-based work-from-home job.   Prior history: She is feeling very fatigued today. I have bene unable to get her into see the OBGYN as they did not accept her insurance.   Prior history:  Caroline Ryan is a 56 year old woman who presents with bilateral knee OA. Last visit we did bilateral knee viscosupplementation injections that has helped her so much.   She was helping her dad move last night.   Her BP is well controlled.   She is taking the Norco about twice per day. This is the only medication that helps to ease her pain.   She hopes to be able to do shopping and spend time with her daughter.   She is 257 lbs, same weight is last time.  She is walking more than last month.   She wants to try a juice diet and help get her weight off. She has been eating fruits and vegetables.  She has been noting sharp pains shooting down her legs and arms. She felt weak when she had this pain. Her arm buckled when she had this pain.  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   Pain is 9/10 on average, and 7/10 right now.   She was diagnosed with fibroids and is stressed by the fact that she has not heard from the gynecologist yet.   She is also having hot flashes from menopause and asks what kind of treatments are available.   Pain Inventory Average Pain 8 Pain Right  Now 8 My pain is sharp, burning and stabbing  In the last 24 hours, has pain interfered with the following? General activity 8 Relation with others 7 Enjoyment of life 9 What TIME of day is your pain at its worst? daytime Sleep (in general) Fair  Pain is worse with: walking and sitting Pain improves with: rest, heat/ice, therapy/exercise, medication, TENS and injections Relief from Meds: 8  Family History  Problem Relation Age of Onset   Breast cancer Mother 70   Hypertension Father    Diabetes Brother    Diabetes Brother    Other Neg Hx    Colon polyps Neg Hx    Colon cancer Neg Hx    Esophageal cancer Neg Hx    Stomach cancer Neg Hx    Rectal cancer Neg Hx    Social History   Socioeconomic History   Marital status: Single    Spouse name: Not on file   Number of children: Not on file   Years of education: Not on file   Highest education level: Not on file  Occupational History   Not on file  Tobacco Use   Smoking status: Former    Packs/day: 0.25    Years: 2.00    Total pack years: 0.50    Types: Cigarettes    Quit date: 08/20/2014    Years since quitting: 7.5   Smokeless tobacco: Never  Vaping Use   Vaping Use: Never used  Substance and Sexual Activity   Alcohol use: Yes    Alcohol/week: 2.0 standard drinks of alcohol    Types: 2 Glasses of wine per week   Drug use: No   Sexual activity: Yes    Birth control/protection: Condom  Other Topics Concern   Not on file  Social History Narrative   Not on file   Social Determinants of Health   Financial Resource Strain: Not on file  Food Insecurity: Food Insecurity Present (08/09/2020)   Hunger Vital Sign    Worried About Running Out of Food in the Last Year: Sometimes true    Ran Out of Food in the Last Year: Sometimes true  Transportation Needs: Unmet Transportation Needs (08/09/2020)   PRAPARE - Hydrologist (Medical): Yes    Lack of Transportation (Non-Medical): Yes   Physical Activity: Not on file  Stress: Not on file  Social Connections: Not on file   Past Surgical History:  Procedure Laterality Date   ABDOMINAL HYSTERECTOMY     BILATERAL SALPINGECTOMY Bilateral 10/10/2017   Procedure: BILATERAL SALPINGECTOMY;  Surgeon: Chancy Milroy, MD;  Location: [facility];  Service: Gynecology;  Laterality: Bilateral;   BREAST BIOPSY     US guided core 2009   BREAST EXCISIONAL  BIOPSY     right 1982 left 1981   BREAST SURGERY     bilateral cysts/benign   COLONOSCOPY  10/17/2021   VAGINAL HYSTERECTOMY N/A 10/10/2017   Procedure: HYSTERECTOMY VAGINAL;  Surgeon: Chancy Milroy, MD;  Location: [facility];  Service: Gynecology;  Laterality: N/A;   Past Surgical History:  Procedure Laterality Date   ABDOMINAL HYSTERECTOMY     BILATERAL SALPINGECTOMY Bilateral 10/10/2017   Procedure: BILATERAL SALPINGECTOMY;  Surgeon: Chancy Milroy, MD;  Location: [facility];  Service: Gynecology;  Laterality: Bilateral;   BREAST BIOPSY     US guided core 2009   BREAST EXCISIONAL BIOPSY     right 1982 left Force     bilateral cysts/benign   COLONOSCOPY  10/17/2021   VAGINAL HYSTERECTOMY N/A 10/10/2017   Procedure: HYSTERECTOMY VAGINAL;  Surgeon: Chancy Milroy, MD;  Location: [facility];  Service: Gynecology;  Laterality: N/A;   Past Medical History:  Diagnosis Date   Anemia    Asthma    Depression    no meds   Dilated aortic root ([place])    34m by echo 07/2021   Eczema    Fibroid    Headache(784.0)    otc prn med   History of blood transfusion    Hypertension    Knee pain, bilateral    arthralgia knee pain bilateral   Lower extremity edema    Mitral regurgitation    Mild to moderate by echo 07/2021   Seasonal allergies    SVD (spontaneous vaginal delivery)    x 3   Vaginal polyp 05/2020   LMP 04/03/2017 (Approximate)   Opioid Risk Score:   Fall Risk Score:  `1  [facility]     01/02/2022    1:45 PM 11/23/2021   11:06 AM  08/15/2021   11:09 AM 04/25/2021   10:04 AM 02/06/2021    9:16 AM 08/09/2020    1:58 PM 05/20/2020   11:54 AM  Depression screen PHQ 2/9  Decreased Interest 0 0 0 '2 1 2 '$ 0  Down, Depressed, Hopeless 0 0 0 '2 1 2 1  '$ PHQ - 2 Score 0 0 0 '4 2 4 1  '$ Altered sleeping  0    2   Tired, decreased energy  0    1   Change in appetite  0    1   Feeling bad or failure about yourself   0    1   Trouble concentrating  0    0   Moving slowly or fidgety/restless  0    0   Suicidal thoughts  0    0   PHQ-9 Score  0    9     Review of Systems  Constitutional: Negative.   HENT: Negative.    Eyes: Negative.   Respiratory: Negative.    Cardiovascular: Negative.   Gastrointestinal: Negative.   Endocrine: Negative.   Genitourinary: Negative.   Musculoskeletal:  Positive for arthralgias and gait problem.  Skin: Negative.   Allergic/Immunologic: Negative.   Hematological: Negative.   Psychiatric/Behavioral: Negative.    All other systems reviewed and are negative.      Objective:   Physical Exam Seen via phone.     Assessment & Plan:  Mrs. WSterlingis a 56year old woman presenting to establish care for bilateral chronic knee pain, neck pain.   1) Bilateral knee OA: -XRs reviewed and consistent with OA -Reviewed MRI results which show  bilaterals severe osteoarthritis which is worst in the medial compartment, as well as severe bilateral cartilage damage. Discussed that arthroscopic debridement may be an option for her since she has been asked to lose weight prior to TKA and this has been difficult for her.  -She received steroid injections q38month. -She had viscosupplementation injections in July with excellent benefits.  -Vitamin D level normal.  -Recommended blue emu oil -Discussed her walking.Made goal to increase to 15 minutes at least 4 times per week.  -Discussed that every pound she loses is 6 lbs off her knees.  -Increase Gabapentin to '600mg'$  TID. Start at night and don't drive initially  after starting higher dose as could make her very sleepy. Educated that Gabapentin can help with her hot flashes.  -continue to work on weight loss -Schedule bilateral steroid injection on Friday July 28th, discussed with patient, staff to get prior auth -discussed response to prior Zilretta injection and that this is a safer option for the joint than regular steroid, but may not be approved more frequently than every 3 months -Called in compounding cream of Diclofenac 3%, Gabapentin 5%, Lidocaine 5%, [facility]ol 1% [facility]  apply 1-2 pumps three to four times per day as needed    2) Muscle cramps:magneisum level was normal last visit.   3) Morbid obesity: Weight is 259, 2 lbs up from last visit. Discussed intermittent fasting and low carb diet. Continue healthy diet. Made goal to start eating at 11:15am instead of 11am and to move dinner time from 8:30pm to 8:15pm. She has moved this to 8pm, discussed trying to move to 7:45pm.  HgbA1C 5.2- discussed that there is no prediabetes -referred to bariatric surgery -wrote letter for her explaining [facility] 4) General health -Reviewed to gynecology for pap smear -Reviewed lipids and were normal last year.   5) Radicular symptoms in upper and lower extremities: -no evidence of weakness on exam to suggest myelopathy -Cervical XR results reviewed with her: they show mild spondylosis at C5-6 and C6-7.  No acute fracture. Lumbar XR reviewed with patient and shows 1. No acute compression fracture. 2. A 7 mm anterolisthesis of L4 on L5, presumably degenerative in etiology. 3. Multilevel degenerative disc disease and facet arthrosis. -Patient worried about heart attack- advised that symptoms appear to be more radicular and are not accompanied by chest pain.   6) Palpitations:  -She has had occasional episodes -Denies chest pain. She gets SOB due to her asthma.   7) Iron deficiency anemia: Iron was very  low in 2018- she has a history of menorrhagia and so she could continue to have anemia. Hemoglobin was within normal limits when checked in May. Discussed iron rich foods as she prefers no supplement at this time.   8) hypertension: Start clonodine patch 0.'1mg'$ .   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 -discussed that she was previously denies disability and that she is impaired in her ability to walk and stand due to her knee pain -discussed that she should look for work that requires upper body strength or cognition -discussed that I can complete disability paperwork for her addressing her current limitations -encouraged continued lifestyle changes to help reduce weight and knee pain  5 minutes spent in discussion of her response to last Zilretta injection, her desire for repeat injection prior to her vacation in the first week of August, assessing  availability and confirming this is ok with patient

## 2022-03-08 DIAGNOSIS — F321 Major depressive disorder, single episode, moderate: Secondary | ICD-10-CM | POA: Diagnosis not present

## 2022-03-09 ENCOUNTER — Ambulatory Visit: Payer: Medicaid Other | Admitting: Nurse Practitioner

## 2022-03-09 ENCOUNTER — Other Ambulatory Visit: Payer: Self-pay | Admitting: *Deleted

## 2022-03-09 ENCOUNTER — Other Ambulatory Visit: Payer: Self-pay

## 2022-03-09 DIAGNOSIS — F321 Major depressive disorder, single episode, moderate: Secondary | ICD-10-CM | POA: Diagnosis not present

## 2022-03-09 NOTE — Patient Instructions (Signed)
Visit Information  Caroline Ryan was given information about Medicaid Managed Care team care coordination services as a part of their [place] Medicaid benefit. Caroline Ryan verbally consented to engagement with the [facility] Managed Care team.   If you are experiencing a medical emergency, please call 911 or report to your local emergency department or urgent care.   If you have a non-emergency medical problem during routine business hours, please contact your provider's office and ask to speak with a nurse.   For questions related to your [facility], please call: [phone] or visit the homepage here: [email or web address]  If you would like to schedule transportation through your [facility], please call the following number at least 2 days in advance of your appointment: [phone]   Rides for urgent appointments can also be made after hours by calling Member Services.  Call the [place] at [phone], at any time, 24 hours a day, 7 days a week. If you are in danger or need immediate medical attention call 911.  If you would like help to quit smoking, call 1-800-QUIT-NOW 930-777-5790) OR Espaol: 1-855-Djelo-Ya (4-268-341-9622) o para ms informacin haga clic aqu or Text READY to 200-400 to register via text  Caroline Ryan,   Please see education materials related to pain management provided as print materials.   The patient verbalized understanding of instructions, educational materials, and care plan provided today and agreed to receive a mailed copy of patient instructions, educational materials, and care plan.   Telephone follow up appointment with Managed Medicaid care management team member scheduled for:03/23/22 @ 1:15pm  Caroline Joiner RN, BSN [place] RN Care Coordinator   Following is  a copy of your plan of care:  Care Plan : RN Care Manager Plan of Care  Updates made by Caroline Montane, RN since 03/09/2022 12:00 AM     Problem: Health Management needs related to Chronic Pain      Long-Range Goal: Development of Plan of Care to Address Health Management needs related to Chronic Pain   Start Date: 03/09/2022  Expected End Date: 06/07/2022  Priority: High  Note:   Current Barriers:  Chronic Disease Management support and education needs related to chronic pain Financial Constraints.  Transportation barriers  RNCM Clinical Goal(s):  Patient will verbalize understanding of plan for management of chronic pain as evidenced by patient reports attend all scheduled medical appointments: 03/12/22 with Physical Rehabilitation for knee injections as evidenced by provider documentation        work with Education officer, museum to Musician and financial difficulty related to the management of chronic pain as evidenced by review of EMR and patient or Education officer, museum report     through collaboration with Consulting civil engineer, provider, and care team.   Interventions: Inter-disciplinary care team collaboration (see longitudinal plan of care) Evaluation of current treatment plan related to  self management and patient's adherence to plan as established by provider   Pain:  (Status: New goal.) Long Term Goal  Pain assessment performed Medications reviewed Reviewed provider established plan for pain management; Discussed importance of adherence to all scheduled medical appointments; Counseled on the importance of reporting any/all new or changed pain symptoms or management strategies to pain management provider; Advised patient to report to care team affect of pain on daily activities; Discussed use of relaxation techniques and/or diversional activities to assist with pain reduction (distraction, imagery, relaxation, massage,  acupressure, TENS, heat, and cold application; Reviewed with  patient prescribed pharmacological and nonpharmacological pain relief strategies; Advised patient to discuss pain management with provider; Assessed social determinant of health barriers;  Advised patient to arrange transportation a week before her appointments to ensure transportation availability Referral to BSW for assistance with non-medical transportation and financial resources, scheduled on 03/15/22 at 1:30pm  Patient Goals/Self-Care Activities: Take medications as prescribed   Attend all scheduled provider appointments Call provider office for new concerns or questions  Work with the social worker to address care coordination needs and will continue to work with the clinical team to address health care and disease management related needs

## 2022-03-09 NOTE — Patient Outreach (Signed)
Medicaid Managed Care   Nurse Care Manager Note  03/09/2022 Name:  Caroline Ryan MRN:  [id number] DOB:  08/04/1966  Caroline Ryan is an 56 y.o. year old female who is a primary patient of Passmore, Enid Derry I, NP.  The [facility] Managed Care Coordination team was consulted for assistance with:    Chronic pain  Ms. Dumont was given information about Medicaid Managed Care Coordination team services today. Jeral Fruit Dickie Patient agreed to services and verbal consent obtained.  Engaged with patient by telephone for initial visit in response to provider referral for case management and/or care coordination services.   Assessments/Interventions:  Review of past medical history, allergies, medications, health status, including review of consultants reports, laboratory and other test data, was performed as part of comprehensive evaluation and provision of chronic care management services.  SDOH (Social Determinants of Health) assessments and interventions performed: SDOH Interventions    Flowsheet Row Most Recent Value  SDOH Interventions   Food Insecurity Interventions Other (Comment)  [BSW referral for assistance with transportation to food pantries and non medical errands]  Housing Interventions Intervention Not Indicated  Transportation Interventions Other (Comment)  [UHC transportion [phone]]       Care Plan  No Known Allergies  Medications Reviewed Today     Reviewed by Heidi Dach, RN (Registered Nurse) on 03/09/22 at 1503  Med List Status: <None>   Medication Order Taking? Sig Documenting Provider Last Dose Status Informant  acetaminophen (TYLENOL) 500 MG tablet [id number] Yes Take 2 tablets (1,000 mg total) by mouth 2 (two) times daily as needed. [name], FNP Taking Active   albuterol (VENTOLIN HFA) 108 (90 Base) MCG/ACT inhaler [id number] Yes Inhale 1 puff every 6 hours as needed for shortness of breath [name], NP Taking Active    aspirin 81 MG chewable tablet [id number] Yes Chew by mouth daily. [provider] Taking Active   carvedilol (COREG) 25 MG tablet [id number] Yes Take 1 tablet (25 mg total) by mouth 2 (two) times daily. [name], MD Taking Active   cetirizine (ZYRTEC) 10 MG tablet [id number] Yes Take 1 tablet (10 mg total) by mouth daily. [name], NP Taking Active   cloNIDine (CATAPRES - DOSED IN MG/24 HR) 0.1 mg/24hr patch [id number] Yes Place 1 patch (0.1 mg total) onto the skin once a week. [name], MD Taking Active   cyclobenzaprine (FLEXERIL) 5 MG tablet [id number] No Take 1 tablet (5 mg total) by mouth 3 (three) times daily as needed for muscle spasms.  Patient not taking: Reported on 03/09/2022   [name], NP Not Taking Active            Med Note (Wilhelmena Zea A   Fri Mar 09, 2022  2:35 PM) Needs refill  diclofenac Sodium (VOLTAREN) 1 % GEL [id number] Yes Apply 4 g topically 4 (four) times daily. [name], NP Taking Active   docusate sodium (COLACE) 100 MG capsule [id number] Yes Take 1 capsule (100 mg total) by mouth 2 (two) times daily as needed. [name], NP Taking Active   furosemide (LASIX) 20 MG tablet [id number] Yes Take 1 tablet (20 mg total) by mouth daily. [name], MD Taking Active   hydrocortisone 2.5 % cream [id number] Yes Apply topically 2 (two) times daily. [name], NP Taking Active   hydrOXYzine (ATARAX) 25 MG tablet [id number] Yes Take 1 tablet (25 mg total) by mouth 3 (three) times daily as needed.  [name], NP Taking Active   ibuprofen (ADVIL) 800 MG tablet [id number] Yes Take 1 tablet (800 mg total) by mouth 3 (three) times daily. [name], MD Taking Active   linaclotide [name]) 72 MCG capsule [id number] Yes Take 1 capsule (72 mcg total) by mouth daily before breakfast. [name], NP Taking Active   montelukast (SINGULAIR) 10 MG tablet [id number] Yes TAKE 1 TABLET (10 MG TOTAL) BY  MOUTH AT BEDTIME. [name], FNP Taking Active   NONFORMULARY OR COMPOUNDED ITEM [id number] No Diclofenac 3%, Gabapentin 5%, Lidocaine 5%, Menthol 1%.  Apply 1-2 pumps three to four times per day as needed  Patient not taking: Reported on 03/09/2022   [name], MD Not Taking Active   olmesartan (BENICAR) 40 MG tablet [id number] Yes Take 1 tablet (40 mg total) by mouth daily. [name], MD Taking Active   spironolactone (ALDACTONE) 100 MG tablet [id number] Yes Take 1 tablet (100 mg total) by mouth daily. 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   Unilateral primary osteoarthritis, left knee 10/22/2019   Unilateral primary osteoarthritis, right knee 10/22/2019   BMI 50.0-59.9, adult (HCC) 10/22/2019   Grieving 06/10/2019   Pruritus 06/10/2019   Lower extremity edema 11/02/2018   Post-operative state 10/10/2017   Symptomatic anemia 05/20/2017   Arthralgia of both knees 06/27/2015   BACK STRAIN, LUMBAR 03/16/2010   KNEE PAIN, BILATERAL 06/30/2009   OBESITY 04/13/2009   Edema 04/13/2009   FIBROADENOMA, BREAST 04/20/2008   HYPERTENSION, BENIGN ESSENTIAL 04/14/2008   Allergic rhinitis 04/14/2008   Asthma 04/14/2008    Conditions to be addressed/monitored per PCP order:   chronic pain  Care Plan : RN Care Manager Plan of Care  Updates made by Heidi Dach, RN since 03/09/2022 12:00 AM     Problem: Health Management needs related to Chronic Pain      Long-Range Goal: Development of Plan of Care to Address Health Management needs related to Chronic Pain   Start Date: 03/09/2022  Expected End Date: 06/07/2022  Priority: High  Note:   Current Barriers:  Chronic Disease Management support and education needs related to chronic pain Financial  Constraints.  Transportation barriers  RNCM Clinical Goal(s):  Patient will verbalize understanding of plan for management of chronic pain as evidenced by patient reports attend all scheduled medical appointments: 03/12/22 with Physical Rehabilitation for knee injections as evidenced by provider documentation        work with Child psychotherapist to Loss adjuster, chartered and financial difficulty related to the management of chronic pain as evidenced by review of EMR and patient or Child psychotherapist report     through collaboration with Medical illustrator, provider, and care team.   Interventions: Inter-disciplinary care team collaboration (see longitudinal plan of care) Evaluation of current treatment plan related to  self management and patient's adherence to plan as established by provider   Pain:  (Status: New goal.) Long Term Goal  Pain assessment performed Medications reviewed Reviewed provider established plan for pain management; Discussed importance of adherence to all scheduled medical appointments; Counseled on the importance of reporting any/all new or changed pain symptoms or management strategies to pain management provider; Advised patient to report to care team  affect of pain on daily activities; Discussed use of relaxation techniques and/or diversional activities to assist with pain reduction (distraction, imagery, relaxation, massage, acupressure, TENS, heat, and cold application; Reviewed with patient prescribed pharmacological and nonpharmacological pain relief strategies; Advised patient to discuss pain management with provider; Assessed social determinant of health barriers;  Advised patient to arrange transportation a week before her appointments to ensure transportation availability Referral to BSW for assistance with non-medical transportation and financial resources, scheduled on 03/15/22 at 1:30pm  Patient Goals/Self-Care Activities: Take medications as prescribed   Attend all  scheduled provider appointments Call provider office for new concerns or questions  Work with the social worker to address care coordination needs and will continue to work with the clinical team to address health care and disease management related needs       Follow Up:  Patient agrees to Care Plan and Follow-up.  Plan: The Managed Medicaid care management team will reach out to the patient again over the next 14 days.  Date/time of next scheduled RN care management/care coordination outreach:  03/23/22 @ 1:15pm  Estanislado Emms RN, BSN [place]  [employer]

## 2022-03-10 DIAGNOSIS — F321 Major depressive disorder, single episode, moderate: Secondary | ICD-10-CM | POA: Diagnosis not present

## 2022-03-12 ENCOUNTER — Other Ambulatory Visit: Payer: Self-pay

## 2022-03-12 ENCOUNTER — Encounter: Payer: Medicaid Other | Admitting: Physical Medicine and Rehabilitation

## 2022-03-12 VITALS — BP 134/86 | HR 68 | Ht 61.0 in | Wt 263.8 lb

## 2022-03-12 DIAGNOSIS — M170 Bilateral primary osteoarthritis of knee: Secondary | ICD-10-CM

## 2022-03-12 DIAGNOSIS — F321 Major depressive disorder, single episode, moderate: Secondary | ICD-10-CM | POA: Diagnosis not present

## 2022-03-12 MED ORDER — TRIAMCINOLONE ACETONIDE 32 MG IX SRER: 32.0000 mg | Freq: Once | INTRA_ARTICULAR | Status: AC

## 2022-03-12 NOTE — Progress Notes (Signed)
Knee injection, bilateral  Indication:Bilateral Knee pain not relieved by medication management and other conservative care.  Informed consent was obtained after describing risks and benefits of the procedure with the patient, this includes bleeding, bruising, infection and medication side effects. The patient wishes to proceed and has given written consent. The patient was placed in a recumbent position. The medial aspect of the knee was marked and prepped with Betadine and alcohol. It was then entered with a 25-gauge 1-1/2 inch needle and Zilretta was  inserted into the knee joint. The patient tolerated the procedure well. Post procedure instructions were given.  

## 2022-03-13 DIAGNOSIS — F321 Major depressive disorder, single episode, moderate: Secondary | ICD-10-CM | POA: Diagnosis not present

## 2022-03-14 DIAGNOSIS — F321 Major depressive disorder, single episode, moderate: Secondary | ICD-10-CM | POA: Diagnosis not present

## 2022-03-15 ENCOUNTER — Other Ambulatory Visit: Payer: Self-pay

## 2022-03-15 DIAGNOSIS — F321 Major depressive disorder, single episode, moderate: Secondary | ICD-10-CM | POA: Diagnosis not present

## 2022-03-15 NOTE — Patient Outreach (Signed)
Care Coordination  03/15/2022  Caroline Ryan [facility] August 11, 1966 [id number]   Medicaid Managed Care   Unsuccessful Outreach Note  03/15/2022 Name: Caroline Ryan [facility] MRN: [id number] DOB: 1965/10/02  Referred by: [name], NP Reason for referral : High Risk Managed Medicaid (MM social Work [employer])   An unsuccessful telephone outreach was attempted today. The patient was referred to the case management team for assistance with care management and care coordination.   Follow Up Plan: A HIPAA compliant phone message was left for the patient providing contact information and requesting a return call.   SIGNATURE

## 2022-03-15 NOTE — Patient Instructions (Signed)
Visit Information  [name] [facility]  - as a part of your Medicaid benefit, you are eligible for care management and care coordination services at no cost or copay. I was unable to reach you by phone today but would be happy to help you with your health related needs. Please feel free to call me @ [phone]).   A member of the Managed Medicaid care management team will reach out to you again over the next 7 days.   Mickel Fuchs, BSW, [place] Managed Medicaid Team  [phone]

## 2022-03-16 ENCOUNTER — Encounter: Payer: Medicaid Other | Admitting: Physical Medicine and Rehabilitation

## 2022-03-16 DIAGNOSIS — F321 Major depressive disorder, single episode, moderate: Secondary | ICD-10-CM | POA: Diagnosis not present

## 2022-03-17 DIAGNOSIS — F321 Major depressive disorder, single episode, moderate: Secondary | ICD-10-CM | POA: Diagnosis not present

## 2022-03-19 DIAGNOSIS — F321 Major depressive disorder, single episode, moderate: Secondary | ICD-10-CM | POA: Diagnosis not present

## 2022-03-20 DIAGNOSIS — F321 Major depressive disorder, single episode, moderate: Secondary | ICD-10-CM | POA: Diagnosis not present

## 2022-03-21 DIAGNOSIS — F321 Major depressive disorder, single episode, moderate: Secondary | ICD-10-CM | POA: Diagnosis not present

## 2022-03-22 DIAGNOSIS — F321 Major depressive disorder, single episode, moderate: Secondary | ICD-10-CM | POA: Diagnosis not present

## 2022-03-23 ENCOUNTER — Other Ambulatory Visit: Payer: Self-pay | Admitting: *Deleted

## 2022-03-23 DIAGNOSIS — F321 Major depressive disorder, single episode, moderate: Secondary | ICD-10-CM | POA: Diagnosis not present

## 2022-03-23 NOTE — Patient Instructions (Signed)
Visit Information  Caroline Ryan was given information about Medicaid Managed Care team care coordination services as a part of their [place] Medicaid benefit. Caroline Ryan verbally consented to engagement with the [facility] Managed Care team.   If you are experiencing a medical emergency, please call 911 or report to your local emergency department or urgent care.   If you have a non-emergency medical problem during routine business hours, please contact your provider's office and ask to speak with a nurse.   For questions related to your [facility], please call: [phone] or visit the homepage here: [email or web address]  If you would like to schedule transportation through your [facility], please call the following number at least 2 days in advance of your appointment: [phone]   Rides for urgent appointments can also be made after hours by calling Member Services.  Call the [place] at [phone], at any time, 24 hours a day, 7 days a week. If you are in danger or need immediate medical attention call 911.  If you would like help to quit smoking, call 1-800-QUIT-NOW 409 770 7853) OR Espaol: 1-855-Djelo-Ya (2-876-811-5726) o para ms informacin haga clic aqu or Text READY to 200-400 to register via text  Caroline Ryan,   Please see education materials related to knee pain and HTN provided as Advertising account planner.   The patient verbalized understanding of instructions, educational materials, and care plan provided today and agreed to receive a mailed copy of patient instructions, educational materials, and care plan.   Telephone follow up appointment with Managed Medicaid care management team member scheduled for:04/24/22@ 2:30pm  Lurena Joiner RN, Pink RN Care Coordinator   Following is  a copy of your plan of care:  Care Plan : RN Care Manager Plan of Care  Updates made by Melissa Montane, RN since 03/23/2022 12:00 AM     Problem: Health Management needs related to Chronic Pain      Long-Range Goal: Development of Plan of Care to Address Health Management needs related to Chronic Pain   Start Date: 03/09/2022  Expected End Date: 06/07/2022  Priority: High  Note:   Current Barriers:  Chronic Disease Management support and education needs related to chronic pain Financial Constraints.  Transportation barriers  Caroline Ryan received bilat knee injections on 7/24, reports improved pain for about a week. She would like a different medication to manage her pain, gabapentin was stopped by Dr. Radford Pax on 12/26/21 due to edema. Patient reports BP at home 130/80 and 120/82, feels like her medications are working well.   RNCM Clinical Goal(s):  Patient will verbalize understanding of plan for management of chronic pain as evidenced by patient reports attend all scheduled medical appointments: 8/7 with BSW, 8/16 with PCP as evidenced by provider documentation        work with Education officer, museum to Musician and financial difficulty related to the management of chronic pain as evidenced by review of EMR and patient or Education officer, museum report     through collaboration with Consulting civil engineer, provider, and care team.   Interventions: Inter-disciplinary care team collaboration (see longitudinal plan of care) Evaluation of current treatment plan related to  self management and patient's adherence to plan as established by provider  Hypertension Interventions:  (Status:  New goal.) Long Term Goal Last practice recorded BP readings:  BP Readings from Last 3 Encounters:  03/12/22 134/86  02/08/22  118/84  01/11/22 (!) 158/98  Most recent eGFR/CrCl:  Lab Results  Component Value Date   EGFR 74 02/08/2022    No components found for: "CRCL"  Evaluation of current treatment plan related  to hypertension self management and patient's adherence to plan as established by provider Reviewed medications with patient and discussed importance of compliance Counseled on the importance of exercise goals with target of 150 minutes per week Discussed plans with patient for ongoing care management follow up and provided patient with direct contact information for care management team Advised patient, providing education and rationale, to monitor blood pressure daily and record, calling PCP for findings outside established parameters Reviewed scheduled/upcoming provider appointments including: 04/04/22 with PCP   Pain:  (Status: Goal on Track (progressing): YES.) Long Term Goal  Pain assessment performed Medications reviewed Reviewed provider established plan for pain management; Discussed importance of adherence to all scheduled medical appointments; Counseled on the importance of reporting any/all new or changed pain symptoms or management strategies to pain management provider; Advised patient to report to care team affect of pain on daily activities; Discussed use of relaxation techniques and/or diversional activities to assist with pain reduction (distraction, imagery, relaxation, massage, acupressure, TENS, heat, and cold application; Reviewed with patient prescribed pharmacological and nonpharmacological pain relief strategies; Advised patient to discuss pain management with provider; Assessed social determinant of health barriers;  Advised patient to arrange transportation a week before her appointments to ensure transportation availability Referral to BSW for assistance with non-medical transportation and financial resources, rescheduled to 03/26/22 at 1:30pm Advised patient to contact Dr. Adam Phenix for pain management, Gabapentin was discontinued on 12/26/21 by Dr. Radford Pax due to edema.  Patient Goals/Self-Care Activities: Take medications as prescribed   Attend all scheduled provider  appointments Call provider office for new concerns or questions  Work with the social worker to address care coordination needs and will continue to work with the clinical team to address health care and disease management related needs

## 2022-03-23 NOTE — Patient Outreach (Signed)
Medicaid Managed Care   Nurse Care Manager Note  03/23/2022 Name:  Caroline Ryan MRN:  [id number] DOB:  07-16-1966  Caroline Ryan is an 56 y.o. year old female who is a primary patient of Passmore, Enid Derry I, NP.  The [facility] Managed Care Coordination team was consulted for assistance with:    HTN Chronic pain  Caroline Ryan was given information about Medicaid Managed Care Coordination team services today. [name] Patient agreed to services and verbal consent obtained.  Engaged with patient by telephone for follow up visit in response to provider referral for case management and/or care coordination services.   Assessments/Interventions:  Review of past medical history, allergies, medications, health status, including review of consultants reports, laboratory and other test data, was performed as part of comprehensive evaluation and provision of chronic care management services.  SDOH (Social Determinants of Health) assessments and interventions performed:   Care Plan  No Known Allergies  Medications Reviewed Today     Reviewed by Heidi Dach, RN (Registered Nurse) on 03/23/22 at 1324  Med List Status: <None>   Medication Order Taking? Sig Documenting Provider Last Dose Status Informant  acetaminophen (TYLENOL) 500 MG tablet [id number] Yes Take 2 tablets (1,000 mg total) by mouth 2 (two) times daily as needed. [name], FNP Taking Active   albuterol (VENTOLIN HFA) 108 (90 Base) MCG/ACT inhaler [id number] Yes Inhale 1 puff every 6 hours as needed for shortness of breath [name], NP Taking Active   aspirin 81 MG chewable tablet [id number] Yes Chew by mouth daily. [provider] Taking Active   carvedilol (COREG) 25 MG tablet [id number] Yes Take 1 tablet (25 mg total) by mouth 2 (two) times daily. [name], MD Taking Active   cetirizine (ZYRTEC) 10 MG tablet [id number] Yes Take 1 tablet (10 mg total) by mouth daily. [name], NP Taking Active   cloNIDine (CATAPRES - DOSED IN MG/24 HR) 0.1 mg/24hr patch [id number] Yes Place 1 patch (0.1 mg total) onto the skin once a week. [name], MD Taking Active   cyclobenzaprine (FLEXERIL) 5 MG tablet [id number] No Take 1 tablet (5 mg total) by mouth 3 (three) times daily as needed for muscle spasms.  Patient not taking: Reported on 03/23/2022   [name], NP Not Taking Active            Med Note (Oveda Dadamo A   Fri Mar 09, 2022  2:35 PM) Needs refill  diclofenac Sodium (VOLTAREN) 1 % GEL [id number] Yes Apply 4 g topically 4 (four) times daily. [name], NP Taking Active   docusate sodium (COLACE) 100 MG capsule [id number] Yes Take 1 capsule (100 mg total) by mouth 2 (two) times daily as needed. [name], NP Taking Active   furosemide (LASIX) 20 MG tablet [id number] Yes Take 1 tablet (20 mg total) by mouth daily. [name], MD Taking Active   hydrocortisone 2.5 % cream [id number] Yes Apply topically 2 (two) times daily. [name], NP Taking Active   hydrOXYzine (ATARAX) 25 MG tablet [id number] Yes Take 1 tablet (25 mg total) by mouth 3 (three) times daily as needed. [name], NP Taking Active   ibuprofen (ADVIL) 800 MG tablet [id number] Yes Take 1 tablet (800 mg total) by mouth 3 (three) times daily. [name], MD Taking Active   linaclotide [name]) 72 MCG capsule [id number] Yes Take 1 capsule (72 mcg total) by mouth daily  before breakfast. [name], NP Taking Active   montelukast (SINGULAIR) 10 MG tablet [id number] Yes TAKE 1 TABLET (10 MG TOTAL) BY MOUTH AT BEDTIME. [name], FNP Taking Active   NONFORMULARY OR COMPOUNDED ITEM [id number] No Diclofenac 3%, Gabapentin 5%, Lidocaine 5%, Menthol 1%.  Apply 1-2 pumps three to four times per day as needed  Patient not taking: Reported on 03/23/2022   [name], MD Not Taking Active   olmesartan (BENICAR) 40 MG tablet [id number] Yes  Take 1 tablet (40 mg total) by mouth daily. [name], MD Taking Active   spironolactone (ALDACTONE) 100 MG tablet [id number] Yes Take 1 tablet (100 mg total) by mouth daily. 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   Unilateral primary osteoarthritis, left knee 10/22/2019   Unilateral primary osteoarthritis, right knee 10/22/2019   BMI 50.0-59.9, adult (HCC) 10/22/2019   Grieving 06/10/2019   Pruritus 06/10/2019   Lower extremity edema 11/02/2018   Post-operative state 10/10/2017   Symptomatic anemia 05/20/2017   Arthralgia of both knees 06/27/2015   BACK STRAIN, LUMBAR 03/16/2010   KNEE PAIN, BILATERAL 06/30/2009   OBESITY 04/13/2009   Edema 04/13/2009   FIBROADENOMA, BREAST 04/20/2008   HYPERTENSION, BENIGN ESSENTIAL 04/14/2008   Allergic rhinitis 04/14/2008   Asthma 04/14/2008    Conditions to be addressed/monitored per PCP order:  HTN and chronic pain  Care Plan : RN Care Manager Plan of Care  Updates made by Heidi Dach, RN since 03/23/2022 12:00 AM     Problem: Health Management needs related to Chronic Pain      Long-Range Goal: Development of Plan of Care to Address Health Management needs related to Chronic Pain   Start Date: 03/09/2022  Expected End Date: 06/07/2022  Priority: High  Note:   Current Barriers:  Chronic Disease Management support and education needs related to chronic pain Financial Constraints.  Transportation barriers  Caroline Ryan received bilat knee injections on 7/24, reports improved pain for about a week.  She would like a different medication to manage her pain, gabapentin was stopped by Dr. Mayford Knife on 12/26/21 due to edema. Patient reports BP at home 130/80 and 120/82, feels like her medications are working well.   RNCM Clinical Goal(s):  Patient will verbalize understanding of plan for management of chronic pain as evidenced by patient reports attend all scheduled medical appointments: 8/7 with BSW, 8/16 with PCP as evidenced by provider documentation        work with Child psychotherapist to Loss adjuster, chartered and financial difficulty related to the management of chronic pain as evidenced by review of EMR and patient or Child psychotherapist report     through collaboration with Medical illustrator, provider, and care team.   Interventions: Inter-disciplinary care team collaboration (see longitudinal plan of care) Evaluation of current treatment plan related to  self management and patient's adherence to plan as established by provider  Hypertension Interventions:  (Status:  New goal.) Long Term Goal Last practice recorded BP readings:  BP Readings from  Last 3 Encounters:  03/12/22 134/86  02/08/22 118/84  01/11/22 (!) 158/98  Most recent eGFR/CrCl:  Lab Results  Component Value Date   EGFR 74 02/08/2022    No components found for: "CRCL"  Evaluation of current treatment plan related to hypertension self management and patient's adherence to plan as established by provider Reviewed medications with patient and discussed importance of compliance Counseled on the importance of exercise goals with target of 150 minutes per week Discussed plans with patient for ongoing care management follow up and provided patient with direct contact information for care management team Advised patient, providing education and rationale, to monitor blood pressure daily and record, calling PCP for findings outside established parameters Reviewed scheduled/upcoming provider appointments including: 04/04/22 with PCP   Pain:   (Status: Goal on Track (progressing): YES.) Long Term Goal  Pain assessment performed Medications reviewed Reviewed provider established plan for pain management; Discussed importance of adherence to all scheduled medical appointments; Counseled on the importance of reporting any/all new or changed pain symptoms or management strategies to pain management provider; Advised patient to report to care team affect of pain on daily activities; Discussed use of relaxation techniques and/or diversional activities to assist with pain reduction (distraction, imagery, relaxation, massage, acupressure, TENS, heat, and cold application; Reviewed with patient prescribed pharmacological and nonpharmacological pain relief strategies; Advised patient to discuss pain management with provider; Assessed social determinant of health barriers;  Advised patient to arrange transportation a week before her appointments to ensure transportation availability Referral to BSW for assistance with non-medical transportation and financial resources, rescheduled to 03/26/22 at 1:30pm Advised patient to contact Dr. Dalene Carrow for pain management, Gabapentin was discontinued on 12/26/21 by Dr. Mayford Knife due to edema.  Patient Goals/Self-Care Activities: Take medications as prescribed   Attend all scheduled provider appointments Call provider office for new concerns or questions  Work with the social worker to address care coordination needs and will continue to work with the clinical team to address health care and disease management related needs       Follow Up:  Patient agrees to Care Plan and Follow-up.  Plan: The Managed Medicaid care management team will reach out to the patient again over the next 30 days.  Date/time of next scheduled RN care management/care coordination outreach:  04/24/22 @ 2:30pm  Estanislado Emms RN, BSN [place]  [facility] RN Care Coordinator

## 2022-03-24 DIAGNOSIS — G4733 Obstructive sleep apnea (adult) (pediatric): Secondary | ICD-10-CM | POA: Diagnosis not present

## 2022-03-24 DIAGNOSIS — F321 Major depressive disorder, single episode, moderate: Secondary | ICD-10-CM | POA: Diagnosis not present

## 2022-03-26 ENCOUNTER — Other Ambulatory Visit: Payer: Self-pay | Admitting: Nurse Practitioner

## 2022-03-26 ENCOUNTER — Other Ambulatory Visit: Payer: Self-pay

## 2022-03-26 DIAGNOSIS — Z1231 Encounter for screening mammogram for malignant neoplasm of breast: Secondary | ICD-10-CM

## 2022-03-26 DIAGNOSIS — F321 Major depressive disorder, single episode, moderate: Secondary | ICD-10-CM | POA: Diagnosis not present

## 2022-03-26 NOTE — Patient Outreach (Signed)
Medicaid Managed Care Social Work Note  03/26/2022 Name:  Caroline Ryan MRN:  [id number] DOB:  September 23, 1965  Caroline Ryan is an 56 y.o. year old female who is a primary patient of Passmore, Jake Church I, NP.  The Medicaid Managed Care Coordination team was consulted for assistance with:  Community Resources   Caroline Ryan was given information about Medicaid Managed Care Coordination team services today. Caroline Ryan Patient agreed to services and verbal consent obtained.  Engaged with patient  for by telephone forinitial visit in response to referral for case management and/or care coordination services.   Assessments/Interventions:  Review of past medical history, allergies, medications, health status, including review of consultants reports, laboratory and other test data, was performed as part of comprehensive evaluation and provision of chronic care management services.  SDOH: (Social Determinant of Health) assessments and interventions performed: BSW completed a telephone outreach with patient. She stated she now has transportation. 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 She has been out of work because they can not find her a patient and because she has knee pain. Patient states her children are paying her bills right now and not resources are needed. BSW will continue to follow up with patient.   Advanced Directives Status:  Not addressed in this encounter.  Care Plan                 No Known Allergies  Medications Reviewed Today     Reviewed by Melissa Montane, RN (Registered Nurse) on 03/23/22 at 1324  Med List Status: <None>   Medication Order Taking? Sig Documenting Provider Last Dose Status Informant  acetaminophen (TYLENOL) 500 MG tablet [id number] Yes Take 2 tablets (1,000 mg total) by mouth 2 (two) times daily as needed. [name], FNP Taking Active   albuterol (VENTOLIN HFA) 108 (90 Base)  MCG/ACT inhaler [id number] Yes Inhale 1 puff every 6 hours as needed for shortness of breath Bo Merino I, NP Taking Active   aspirin 81 MG chewable tablet [id number] Yes Chew by mouth daily. [provider] Taking Active   carvedilol (COREG) 25 MG tablet [id number] Yes Take 1 tablet (25 mg total) by mouth 2 (two) times daily. [name], MD Taking Active   cetirizine (ZYRTEC) 10 MG tablet [id number] Yes Take 1 tablet (10 mg total) by mouth daily. Bo Merino I, NP Taking Active   cloNIDine (CATAPRES - DOSED IN MG/24 HR) 0.1 mg/24hr patch [id number] Yes Place 1 patch (0.1 mg total) onto the skin once a week. [name], MD Taking Active   cyclobenzaprine (FLEXERIL) 5 MG tablet [id number] No Take 1 tablet (5 mg total) by mouth 3 (three) times daily as needed for muscle spasms.  Patient not taking: Reported on 03/23/2022   Bo Merino I, NP Not Taking Active            Med Note (ROBB, MELANIE A   Fri Mar 09, 2022  2:35 PM) Needs refill  diclofenac Sodium (VOLTAREN) 1 % GEL [id number] Yes Apply 4 g topically 4 (four) times daily. Bo Merino I, NP Taking Active   docusate sodium (COLACE) 100 MG capsule [id number] Yes Take 1 capsule (100 mg total) by mouth 2 (two) times daily as needed. Bo Merino I, NP Taking Active   furosemide (LASIX) 20 MG tablet [id number] Yes Take 1 tablet (20 mg total) by mouth daily. Ranell Patrick Clide Deutscher, MD Taking  Active   hydrocortisone 2.5 % cream [id number] Yes Apply topically 2 (two) times daily. Bo Merino I, NP Taking Active   hydrOXYzine (ATARAX) 25 MG tablet [id number] Yes Take 1 tablet (25 mg total) by mouth 3 (three) times daily as needed. Bo Merino I, NP Taking Active   ibuprofen (ADVIL) 800 MG tablet [id number] Yes Take 1 tablet (800 mg total) by mouth 3 (three) times daily. [name], MD Taking Active   linaclotide Rolan Lipa) 72 MCG capsule [id number] Yes Take 1 capsule (72 mcg total) by mouth daily before  breakfast. Bo Merino I, NP Taking Active   montelukast (SINGULAIR) 10 MG tablet [id number] Yes TAKE 1 TABLET (10 MG TOTAL) BY MOUTH AT BEDTIME. [name], FNP Taking Active   NONFORMULARY OR COMPOUNDED ITEM [id number] No Diclofenac 3%, Gabapentin 5%, Lidocaine 5%, Menthol 1%.  Apply 1-2 pumps three to four times per day as needed  Patient not taking: Reported on 03/23/2022   [name], MD Not Taking Active   olmesartan (BENICAR) 40 MG tablet [id number] Yes Take 1 tablet (40 mg total) by mouth daily. [name], MD Taking Active   spironolactone (ALDACTONE) 100 MG tablet [id number] Yes Take 1 tablet (100 mg total) by mouth daily. [name], MD Taking Active   Triamcinolone Acetonide Orvan Seen) intra-articular injection 32 mg [id number]   [name], MD  Active   Triamcinolone Acetonide (ZILRETTA) intra-articular injection 32 mg [id number]   [name], MD  Active             Patient Active Problem List   Diagnosis Date Noted   OSA (obstructive sleep apnea) 11/09/2021   Mitral regurgitation 09/07/2021   Dilated aortic root ([place]) 09/07/2021   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/2020   Unilateral primary osteoarthritis, left knee 10/22/2019   Unilateral primary osteoarthritis, right knee 10/22/2019   BMI 50.0-59.9, adult ([place]) 10/22/2019   Grieving 06/10/2019   Pruritus 06/10/2019   Lower extremity edema 11/02/2018   Post-operative state 10/10/2017   Symptomatic anemia 05/20/2017   Arthralgia of both knees 06/27/2015   BACK STRAIN, LUMBAR 03/16/2010   KNEE PAIN, BILATERAL 06/30/2009   OBESITY 04/13/2009   Edema 04/13/2009   FIBROADENOMA, BREAST 04/20/2008   HYPERTENSION, BENIGN ESSENTIAL 04/14/2008   Allergic rhinitis 04/14/2008   Asthma 04/14/2008    Conditions to be addressed/monitored per PCP order:   community resources  There are no care plans that you recently modified to display for this patient.   Follow up:  Patient agrees to Care  Plan and Follow-up.  Plan: The Managed Medicaid care management team will reach out to the patient again over the next 30 days.  Date/time of next scheduled Social Work care management/care coordination outreach:  04/26/22  Mickel Fuchs, Arita Miss, [place] Managed Medicaid Team  [phone]

## 2022-03-26 NOTE — Patient Instructions (Signed)
Visit Information  [name] was given information about Medicaid Managed Care team care coordination services as a part of their [place] Medicaid benefit. Caroline Ryan verbally consented to engagement with the [facility] Managed Care team.   If you are experiencing a medical emergency, please call 911 or report to your local emergency department or urgent care.   If you have a non-emergency medical problem during routine business hours, please contact your provider's office and ask to speak with a nurse.   For questions related to your [facility], please call: [phone] or visit the homepage here: [email or web address]  If you would like to schedule transportation through your [facility], please call the following number at least 2 days in advance of your appointment: [phone]   Rides for urgent appointments can also be made after hours by calling Member Services.  Call the [facility] at [phone], at any time, 24 hours a day, 7 days a week. If you are in danger or need immediate medical attention call 911.  If you would like help to quit smoking, call 1-800-QUIT-NOW (346)881-7212) OR Espaol: 1-855-Djelo-Ya (9-357-017-7939) o para ms informacin haga clic aqu or Text READY to 200-400 to register via text  [name] - following are the goals we discussed in your visit today:   Goals Addressed   None     Social Worker will follow up in 30 days .   Caroline Ryan, BSW, [place] Managed Medicaid Team  [phone]   Following is a copy of your plan of care:  There are no care plans that you recently modified to display for this patient.

## 2022-03-27 DIAGNOSIS — F321 Major depressive disorder, single episode, moderate: Secondary | ICD-10-CM | POA: Diagnosis not present

## 2022-03-28 DIAGNOSIS — F321 Major depressive disorder, single episode, moderate: Secondary | ICD-10-CM | POA: Diagnosis not present

## 2022-03-29 DIAGNOSIS — F321 Major depressive disorder, single episode, moderate: Secondary | ICD-10-CM | POA: Diagnosis not present

## 2022-03-30 DIAGNOSIS — F321 Major depressive disorder, single episode, moderate: Secondary | ICD-10-CM | POA: Diagnosis not present

## 2022-04-04 ENCOUNTER — Ambulatory Visit: Payer: Medicaid Other | Admitting: Nurse Practitioner

## 2022-04-06 ENCOUNTER — Telehealth: Payer: Self-pay | Admitting: Cardiology

## 2022-04-06 ENCOUNTER — Other Ambulatory Visit: Payer: Self-pay

## 2022-04-06 MED ORDER — SPIRONOLACTONE 100 MG PO TABS: 100.0000 mg | ORAL_TABLET | Freq: Every day | ORAL | 0 refills | Status: DC

## 2022-04-06 MED ORDER — CARVEDILOL 25 MG PO TABS
25.0000 mg | ORAL_TABLET | Freq: Two times a day (BID) | ORAL | 0 refills | Status: DC
Start: 2022-04-06 — End: 2022-05-01

## 2022-04-06 MED ORDER — OLMESARTAN MEDOXOMIL 40 MG PO TABS: 40.0000 mg | ORAL_TABLET | Freq: Every day | ORAL | 0 refills | Status: DC

## 2022-04-06 MED ORDER — CLONIDINE 0.1 MG/24HR TD PTWK: 0.1000 mg | MEDICATED_PATCH | TRANSDERMAL | 0 refills | Status: DC

## 2022-04-06 NOTE — Telephone Encounter (Signed)
Pt requesting a refill on clonidine. This medication was refill by another provider. Would Dr. Radford Pax like to refill this medication? Please address

## 2022-04-06 NOTE — Telephone Encounter (Signed)
Pt c/o medication issue:  1. Name of Medication:   Blood pressure medication  2. How are you currently taking this medication (dosage and times per day)? As prescribed  3. Are you having a reaction (difficulty breathing--STAT)? No  4. What is your medication issue?   Patient called stating she traveled to [place] and the baggage with her medication has been misplaced in transit.  Patient stated she could not remember the name of the blood pressure medication she needs.  Patient would like a prescription sent to pharmacy in [place], [address], [place] [address], ph# [phone], fax# [phone] for  enough medication to last her 7 days.

## 2022-04-06 NOTE — Telephone Encounter (Signed)
Pt calling requesting a refill on clonidine. This medication was reorder by another provider, would Dr. Radford Pax like to refill this medication? Please address

## 2022-04-06 NOTE — Telephone Encounter (Signed)
Pt's medications were sent to pt's pharmacy as requested. Confirmation received.  

## 2022-04-09 DIAGNOSIS — F321 Major depressive disorder, single episode, moderate: Secondary | ICD-10-CM | POA: Diagnosis not present

## 2022-04-10 DIAGNOSIS — F321 Major depressive disorder, single episode, moderate: Secondary | ICD-10-CM | POA: Diagnosis not present

## 2022-04-10 NOTE — Telephone Encounter (Signed)
Pt's medication was sent to pt's pharmacy as requested. Confirmation received.  °

## 2022-04-11 DIAGNOSIS — F321 Major depressive disorder, single episode, moderate: Secondary | ICD-10-CM | POA: Diagnosis not present

## 2022-04-12 DIAGNOSIS — F321 Major depressive disorder, single episode, moderate: Secondary | ICD-10-CM | POA: Diagnosis not present

## 2022-04-13 DIAGNOSIS — F321 Major depressive disorder, single episode, moderate: Secondary | ICD-10-CM | POA: Diagnosis not present

## 2022-04-14 DIAGNOSIS — F321 Major depressive disorder, single episode, moderate: Secondary | ICD-10-CM | POA: Diagnosis not present

## 2022-04-16 DIAGNOSIS — F321 Major depressive disorder, single episode, moderate: Secondary | ICD-10-CM | POA: Diagnosis not present

## 2022-04-17 DIAGNOSIS — F321 Major depressive disorder, single episode, moderate: Secondary | ICD-10-CM | POA: Diagnosis not present

## 2022-04-18 DIAGNOSIS — F321 Major depressive disorder, single episode, moderate: Secondary | ICD-10-CM | POA: Diagnosis not present

## 2022-04-19 ENCOUNTER — Ambulatory Visit
Admission: RE | Admit: 2022-04-19 | Discharge: 2022-04-19 | Disposition: A | Discharge status: Home / Self Care | Payer: Medicaid Other | Source: Ambulatory Visit | Attending: Nurse Practitioner | Admitting: Nurse Practitioner

## 2022-04-19 DIAGNOSIS — Z1231 Encounter for screening mammogram for malignant neoplasm of breast: Secondary | ICD-10-CM | POA: Diagnosis not present

## 2022-04-19 DIAGNOSIS — F321 Major depressive disorder, single episode, moderate: Secondary | ICD-10-CM | POA: Diagnosis not present

## 2022-04-20 DIAGNOSIS — F321 Major depressive disorder, single episode, moderate: Secondary | ICD-10-CM | POA: Diagnosis not present

## 2022-04-21 DIAGNOSIS — F321 Major depressive disorder, single episode, moderate: Secondary | ICD-10-CM | POA: Diagnosis not present

## 2022-04-23 DIAGNOSIS — F321 Major depressive disorder, single episode, moderate: Secondary | ICD-10-CM | POA: Diagnosis not present

## 2022-04-24 ENCOUNTER — Other Ambulatory Visit: Payer: Self-pay | Admitting: *Deleted

## 2022-04-24 DIAGNOSIS — F321 Major depressive disorder, single episode, moderate: Secondary | ICD-10-CM | POA: Diagnosis not present

## 2022-04-24 DIAGNOSIS — G4733 Obstructive sleep apnea (adult) (pediatric): Secondary | ICD-10-CM | POA: Diagnosis not present

## 2022-04-24 NOTE — Patient Outreach (Signed)
Medicaid Managed Care   Nurse Care Manager Note  04/24/2022 Name:  Caroline Ryan MRN:  [id number] DOB:  21-Jun-1966  Caroline Ryan is an 56 y.o. year old female who is a primary patient of [place], [place] Derry I, NP.  The [facility] Managed Care Coordination team was consulted for assistance with:    HTN Pain  Caroline Ryan was given information about Medicaid Managed Care Coordination team services today. Caroline Ryan Patient agreed to services and verbal consent obtained.  Engaged with patient by telephone for follow up visit in response to provider referral for case management and/or care coordination services.   Assessments/Interventions:  Review of past medical history, allergies, medications, health status, including review of consultants reports, laboratory and other test data, was performed as part of comprehensive evaluation and provision of chronic care management services.  SDOH (Social Determinants of Health) assessments and interventions performed: SDOH Interventions    Flowsheet Row Patient Outreach Telephone from 03/09/2022 in [facility] Care Coordination Office Visit from 08/09/2020 in [facility] [employer] [facility] [employer] [facility]  SDOH Interventions    Food Insecurity Interventions Other (Comment)  [BSW referral for assistance with transportation to food pantries and non medical errands] 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  Housing Interventions Intervention Not Indicated --  Transportation Interventions Other (Comment)  [UHC transportion [phone]] [employer]       Care Plan  No Known Allergies  Medications Reviewed Today     Reviewed by Heidi Dach, RN (Registered Nurse) on 04/24/22 at 1441  Med List Status: <None>   Medication Order Taking? Sig Documenting Provider Last Dose Status Informant  acetaminophen (TYLENOL) 500 MG tablet [id number] Yes Take 2 tablets (1,000 mg total) by  mouth 2 (two) times daily as needed. [name], FNP Taking Active   albuterol (VENTOLIN HFA) 108 (90 Base) MCG/ACT inhaler [id number] Yes Inhale 1 puff every 6 hours as needed for shortness of breath [name], NP Taking Active   aspirin 81 MG chewable tablet [id number] Yes Chew by mouth daily. [provider] Taking Active   carvedilol (COREG) 25 MG tablet [id number] Yes Take 1 tablet (25 mg total) by mouth 2 (two) times daily. [name], MD Taking Active   cetirizine (ZYRTEC) 10 MG tablet [id number] Yes Take 1 tablet (10 mg total) by mouth daily. [name], NP Taking Active   cloNIDine (CATAPRES - DOSED IN MG/24 HR) 0.1 mg/24hr patch [id number] Yes Place 1 patch (0.1 mg total) onto the skin once a week. [name], MD Taking Active   cyclobenzaprine (FLEXERIL) 5 MG tablet [id number] Yes Take 1 tablet (5 mg total) by mouth 3 (three) times daily as needed for muscle spasms. [name], NP Taking Active            Med Note (Tallis Soledad A   Tue Apr 24, 2022  2:36 PM)    diclofenac Sodium (VOLTAREN) 1 % GEL [id number] Yes Apply 4 g topically 4 (four) times daily. [name], NP Taking Active   docusate sodium (COLACE) 100 MG capsule [id number] Yes Take 1 capsule (100 mg total) by mouth 2 (two) times daily as needed. [name], NP Taking Active   furosemide (LASIX) 20 MG tablet [id number] Yes Take 1 tablet (20 mg total) by mouth daily. [name], MD Taking Active   hydrocortisone 2.5 % cream [id number] Yes Apply topically 2 (two) times daily. Orion Crook  I, NP Taking Active   hydrOXYzine (ATARAX) 25 MG tablet [id number] Yes Take 1 tablet (25 mg total) by mouth 3 (three) times daily as needed. [name], NP Taking Active   ibuprofen (ADVIL) 800 MG tablet [id number] Yes Take 1 tablet (800 mg total) by mouth 3 (three) times daily. [name], MD Taking Active   linaclotide [name]) 72 MCG capsule [id number] Yes Take 1  capsule (72 mcg total) by mouth daily before breakfast. [name], NP Taking Active   montelukast (SINGULAIR) 10 MG tablet [id number] Yes TAKE 1 TABLET (10 MG TOTAL) BY MOUTH AT BEDTIME. [name], FNP Taking Active   NONFORMULARY OR COMPOUNDED ITEM [id number] No Diclofenac 3%, Gabapentin 5%, Lidocaine 5%, Menthol 1%.  Apply 1-2 pumps three to four times per day as needed  Patient not taking: Reported on 03/23/2022   Raulkar, Drema Pry, MD Not Taking Active   olmesartan (BENICAR) 40 MG tablet [id number] Yes Take 1 tablet (40 mg total) by mouth daily. [name], MD Taking Active   spironolactone (ALDACTONE) 100 MG tablet [id number] Yes Take 1 tablet (100 mg total) by mouth daily. [name], MD Taking Active   Triamcinolone Acetonide [facility]) intra-articular injection 32 mg [id number]   [name], MD  Active   Triamcinolone Acetonide (ZILRETTA) intra-articular injection 32 mg [id number]   [name], MD  Active             Patient Active Problem List   Diagnosis Date Noted   OSA (obstructive sleep apnea) 11/09/2021   Mitral regurgitation 09/07/2021   Dilated aortic root (HCC) 09/07/2021   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/2020   Unilateral primary osteoarthritis, left knee 10/22/2019   Unilateral primary osteoarthritis, right knee 10/22/2019   BMI 50.0-59.9, adult (HCC) 10/22/2019   Grieving 06/10/2019   Pruritus 06/10/2019   Lower extremity edema 11/02/2018   Post-operative state 10/10/2017   Symptomatic anemia 05/20/2017   Arthralgia of both knees 06/27/2015   BACK STRAIN, LUMBAR 03/16/2010   KNEE PAIN, BILATERAL 06/30/2009   OBESITY 04/13/2009   Edema 04/13/2009   FIBROADENOMA, BREAST 04/20/2008   HYPERTENSION, BENIGN ESSENTIAL 04/14/2008   Allergic rhinitis 04/14/2008   Asthma 04/14/2008    Conditions to be addressed/monitored per PCP order:  HTN and Pain  Care Plan : RN Care Manager Plan of Care  Updates made by Heidi Dach, RN since  04/24/2022 12:00 AM     Problem: Health Management needs related to Chronic Pain      Long-Range Goal: Development of Plan of Care to Address Health Management needs related to Chronic Pain   Start Date: 03/09/2022  Expected End Date: 06/07/2022  Priority: High  Note:   Current Barriers:  Chronic Disease Management support and education needs related to chronic pain Financial Constraints.  Transportation barriers  Ms. Oleski recently experienced the loss of a loved one and had to travel out of state. She reports the pain in her knees is so bad, that it limits her activity level. Her next appointment with Pain Management is in November. She is working to get disability and needs help with resources to help with utilities. Her therapist will take her to [employer].   RNCM Clinical Goal(s):  Patient will verbalize understanding of plan for management of chronic pain as evidenced by patient reports attend all scheduled medical appointments: BSW on 9/7, LCSW 9/13 and PCP on 9/25 as evidenced by provider documentation  work with Child psychotherapist to Loss adjuster, chartered and financial difficulty related to the management of chronic pain as evidenced by review of EMR and patient or Child psychotherapist report     through collaboration with Medical illustrator, provider, and care team.   Interventions: Inter-disciplinary care team collaboration (see longitudinal plan of care) Evaluation of current treatment plan related to  self management and patient's adherence to plan as established by provider  Hypertension Interventions:  (Status:  Condition stable.  Not addressed this visit.) Long Term Goal Last practice recorded BP readings:  BP Readings from Last 3 Encounters:  03/12/22 134/86  02/08/22 118/84  01/11/22 (!) 158/98  Most recent eGFR/CrCl:  Lab Results  Component Value Date   EGFR 74 02/08/2022    No components found for: "CRCL"  Evaluation of current treatment plan related to  hypertension self management and patient's adherence to plan as established by provider Reviewed medications with patient and discussed importance of compliance Counseled on the importance of exercise goals with target of 150 minutes per week Discussed plans with patient for ongoing care management follow up and provided patient with direct contact information for care management team Advised patient, providing education and rationale, to monitor blood pressure daily and record, calling PCP for findings outside established parameters Reviewed scheduled/upcoming provider appointments including: 9/7 with BSW, 9/13 with LCSW and 9/25 with PCP   Pain:  (Status: Goal on Track (progressing): YES.) Long Term Goal  Pain assessment performed Medications reviewed Reviewed provider established plan for pain management; Discussed importance of adherence to all scheduled medical appointments; Counseled on the importance of reporting any/all new or changed pain symptoms or management strategies to pain management provider; Advised patient to report to care team affect of pain on daily activities; Discussed use of relaxation techniques and/or diversional activities to assist with pain reduction (distraction, imagery, relaxation, massage, acupressure, TENS, heat, and cold application; Reviewed with patient prescribed pharmacological and nonpharmacological pain relief strategies; Advised patient to discuss pain management with provider; Referral to LCSW for mental health needs, managing grief Advised patient to contact Dr. Dalene Carrow for pain management, Gabapentin was discontinued on 12/26/21 by Dr. Mayford Knife due to edema-RNCM collaborated with Dr. Dalene Carrow  Patient Goals/Self-Care Activities: Take medications as prescribed   Attend all scheduled provider appointments Call provider office for new concerns or questions  Work with the social worker to address care coordination needs and will continue to work with the  clinical team to address health care and disease management related needs       Follow Up:  Patient agrees to Care Plan and Follow-up.  Plan: The Managed Medicaid care management team will reach out to the patient again over the next 30 days.  Date/time of next scheduled RN care management/care coordination outreach:  05/28/22 @ 11:15AM  Estanislado Emms RN, BSN [place]  [facility]

## 2022-04-24 NOTE — Patient Instructions (Signed)
Visit Information  [name] was given information about Medicaid Managed Care team care coordination services as a part of their [place] Medicaid benefit. Caroline Ryan verbally consented to engagement with the [facility] Managed Care team.   If you are experiencing a medical emergency, please call 911 or report to your local emergency department or urgent care.   If you have a non-emergency medical problem during routine business hours, please contact your provider's office and ask to speak with a nurse.   For questions related to your [facility], please call: [phone] or visit the homepage here: [email or web address]  If you would like to schedule transportation through your [facility], please call the following number at least 2 days in advance of your appointment: [phone]   Rides for urgent appointments can also be made after hours by calling Member Services.  Call the [place] at [phone], at any time, 24 hours a day, 7 days a week. If you are in danger or need immediate medical attention call 911.  If you would like help to quit smoking, call 1-800-QUIT-NOW 515-483-8867) OR Espaol: 1-855-Djelo-Ya (8-850-277-4128) o para ms informacin haga clic aqu or Text READY to 200-400 to register via text  Caroline Ryan,   Please see education materials related to [place] provided as Advertising account planner.   The patient verbalized understanding of instructions, educational materials, and care plan provided today and agreed to receive a mailed copy of patient instructions, educational materials, and care plan.   Telephone follow up appointment with Managed Medicaid care management team member scheduled for:05/28/22 @ 11:15am  Lurena Joiner RN, BSN [place] RN Care Coordinator   Following  is a copy of your plan of care:  Care Plan : RN Care Manager Plan of Care  Updates made by Melissa Montane, RN since 04/24/2022 12:00 AM     Problem: Health Management needs related to Chronic Pain      Long-Range Goal: Development of Plan of Care to Address Health Management needs related to Chronic Pain   Start Date: 03/09/2022  Expected End Date: 06/07/2022  Priority: High  Note:   Current Barriers:  Chronic Disease Management support and education needs related to chronic pain Financial Constraints.  Transportation barriers  Caroline Ryan recently experienced the loss of a loved one and had to travel out of state. She reports the pain in her knees is so bad, that it limits her activity level. Her next appointment with Pain Management is in November. She is working to get disability and needs help with resources to help with utilities. Her therapist will take her to [employer].   RNCM Clinical Goal(s):  Patient will verbalize understanding of plan for management of chronic pain as evidenced by patient reports attend all scheduled medical appointments: BSW on 9/7, LCSW 9/13 and PCP on 9/25 as evidenced by provider documentation        work with Education officer, museum to Musician and financial difficulty related to the management of chronic pain as evidenced by review of EMR and patient or Education officer, museum report     through collaboration with Consulting civil engineer, provider, and care team.   Interventions: Inter-disciplinary care team collaboration (see longitudinal plan of care) Evaluation of current treatment plan related to  self management and patient's adherence to plan as established by provider  Hypertension Interventions:  (Status:  Condition stable.  Not  addressed this visit.) Long Term Goal Last practice recorded BP readings:  BP Readings from Last 3 Encounters:  03/12/22 134/86  02/08/22 118/84  01/11/22 (!) 158/98  Most recent eGFR/CrCl:  Lab Results  Component  Value Date   EGFR 74 02/08/2022    No components found for: "CRCL"  Evaluation of current treatment plan related to hypertension self management and patient's adherence to plan as established by provider Reviewed medications with patient and discussed importance of compliance Counseled on the importance of exercise goals with target of 150 minutes per week Discussed plans with patient for ongoing care management follow up and provided patient with direct contact information for care management team Advised patient, providing education and rationale, to monitor blood pressure daily and record, calling PCP for findings outside established parameters Reviewed scheduled/upcoming provider appointments including: 9/7 with BSW, 9/13 with LCSW and 9/25 with PCP   Pain:  (Status: Goal on Track (progressing): YES.) Long Term Goal  Pain assessment performed Medications reviewed Reviewed provider established plan for pain management; Discussed importance of adherence to all scheduled medical appointments; Counseled on the importance of reporting any/all new or changed pain symptoms or management strategies to pain management provider; Advised patient to report to care team affect of pain on daily activities; Discussed use of relaxation techniques and/or diversional activities to assist with pain reduction (distraction, imagery, relaxation, massage, acupressure, TENS, heat, and cold application; Reviewed with patient prescribed pharmacological and nonpharmacological pain relief strategies; Advised patient to discuss pain management with provider; Referral to LCSW for mental health needs, managing grief Advised patient to contact Dr. Adam Phenix for pain management, Gabapentin was discontinued on 12/26/21 by Dr. Radford Pax due to edema-RNCM collaborated with Dr. Adam Phenix  Patient Goals/Self-Care Activities: Take medications as prescribed   Attend all scheduled provider appointments Call provider office for new  concerns or questions  Work with the social worker to address care coordination needs and will continue to work with the clinical team to address health care and disease management related needs

## 2022-04-25 ENCOUNTER — Other Ambulatory Visit: Payer: Self-pay | Admitting: Nurse Practitioner

## 2022-04-25 ENCOUNTER — Other Ambulatory Visit: Payer: Self-pay

## 2022-04-25 ENCOUNTER — Other Ambulatory Visit: Payer: Self-pay | Admitting: Physical Medicine and Rehabilitation

## 2022-04-25 DIAGNOSIS — F321 Major depressive disorder, single episode, moderate: Secondary | ICD-10-CM | POA: Diagnosis not present

## 2022-04-26 ENCOUNTER — Other Ambulatory Visit: Payer: Self-pay

## 2022-04-26 DIAGNOSIS — F321 Major depressive disorder, single episode, moderate: Secondary | ICD-10-CM | POA: Diagnosis not present

## 2022-04-26 MED ORDER — CYCLOBENZAPRINE HCL 5 MG PO TABS
5.0000 mg | ORAL_TABLET | Freq: Three times a day (TID) | ORAL | 0 refills | Status: DC | PRN
  Filled 2022-04-26 – 2022-05-03 (×3): qty 30, 10d supply, fill #0

## 2022-04-26 NOTE — Patient Outreach (Signed)
Care Coordination  04/26/2022  [name] [facility] 11-29-1965 [id number]   Medicaid Managed Care   Unsuccessful Outreach Note  04/26/2022 Name: Caroline Ryan [facility] MRN: [id number] DOB: April 01, 1966  Referred by: [name], NP Reason for referral : High Risk Managed Medicaid (MM Social work [employer])   An unsuccessful telephone outreach was attempted today. The patient was referred to the case management team for assistance with care management and care coordination.   Follow Up Plan: The patient has been provided with contact information for the care management team and has been advised to call with any health related questions or concerns.   Mickel Fuchs, BSW, [place] Managed Medicaid Team  [phone]

## 2022-04-26 NOTE — Patient Instructions (Signed)
Visit Information  [name] [facility]  - as a part of your Medicaid benefit, you are eligible for care management and care coordination services at no cost or copay. I was unable to reach you by phone today but would be happy to help you with your health related needs. Please feel free to call me @ [phone].     Mickel Fuchs, BSW, [place] Managed Medicaid Team  [phone]

## 2022-04-27 ENCOUNTER — Other Ambulatory Visit: Payer: Self-pay

## 2022-04-27 DIAGNOSIS — F321 Major depressive disorder, single episode, moderate: Secondary | ICD-10-CM | POA: Diagnosis not present

## 2022-04-28 DIAGNOSIS — F321 Major depressive disorder, single episode, moderate: Secondary | ICD-10-CM | POA: Diagnosis not present

## 2022-04-30 DIAGNOSIS — F321 Major depressive disorder, single episode, moderate: Secondary | ICD-10-CM | POA: Diagnosis not present

## 2022-05-01 ENCOUNTER — Other Ambulatory Visit: Payer: Self-pay

## 2022-05-01 ENCOUNTER — Other Ambulatory Visit: Payer: Self-pay | Admitting: Cardiology

## 2022-05-01 DIAGNOSIS — F321 Major depressive disorder, single episode, moderate: Secondary | ICD-10-CM | POA: Diagnosis not present

## 2022-05-02 ENCOUNTER — Other Ambulatory Visit: Payer: Self-pay | Admitting: Licensed Clinical Social Worker

## 2022-05-02 DIAGNOSIS — F4321 Adjustment disorder with depressed mood: Secondary | ICD-10-CM

## 2022-05-02 DIAGNOSIS — F321 Major depressive disorder, single episode, moderate: Secondary | ICD-10-CM | POA: Diagnosis not present

## 2022-05-02 NOTE — Patient Instructions (Addendum)
Visit Information  Caroline Ryan was given information about Medicaid Managed Care team care coordination services as a part of their [place] [employer] benefit. Caroline Ryan verbally consented to engagement with the [facility] Managed Care team.   If you are experiencing a medical emergency, please call 911 or report to your local emergency department or urgent care.   If you have a non-emergency medical problem during routine business hours, please contact your provider's office and ask to speak with a nurse.   For questions related to your [facility], please call: [phone] or visit the homepage here: [email or web address]  If you would like to schedule transportation through your [facility], please call the following number at least 2 days in advance of your appointment: [phone]   Rides for urgent appointments can also be made after hours by calling Member Services.  Call the [place] at [phone], at any time, 24 hours a day, 7 days a week. If you are in danger or need immediate medical attention call 911.  If you would like help to quit smoking, call 1-800-QUIT-NOW 6261555842) OR Espaol: 1-855-Djelo-Ya (2-993-716-9678) o para ms informacin haga clic aqu or Text READY to 200-400 to register via text  Following is a copy of your plan of care:  Care Plan : LCSW Plan of Care  Updates made by Greg Cutter, LCSW since 05/02/2022 12:00 AM     Problem: Depression Identification (Depression)      Long-Range Goal: Depressive Symptoms Identified   Start Date: 05/02/2022  Priority: High  Note:   Priority: High  Timeframe:  Long-Range Goal Priority:  High Start Date: 05/02/22              Expected End Date:  ongoing                     Follow Up Date--05/16/22 at 11 am  - check out bereavement counseling and long  term counseling options -resources provided for you to write down due to low usage of email - keep 90 percent of counseling appointments - schedule initial counseling appointment    Why is this important?             Beating grief and depression may take some time.            If you don't feel better right away, don't give up on your treatment plan.    Current barriers:   Chronic Mental Health needs related to depression and grief from losing her daughter in April of 2023. Patient requires Support, Education, Resources, Referrals, Advocacy, and Care Coordination, in order to meet Unmet Mental Health Needs. Patient will implement clinical interventions discussed today to decrease symptoms of grief and increase knowledge and/or ability of: coping skills. Mental Health Concerns and Social Isolation Patient lacks knowledge of available community counseling agencies and resources.  Clinical Goal(s): verbalize understanding of plan for management of Grief, Depression, and Stress and demonstrate a reduction in symptoms. Patient will connect with a provider for ongoing mental health treatment, increase coping skills, healthy habits, self-management skills, and stress reduction        Patient Goals/Self-Care Activities: Over the next 120 days Attend scheduled medical appointments Utilize healthy coping skills and supportive resources discussed Contact PCP with any questions or concerns Keep 90 percent of counseling appointments Call your insurance provider for more information about your Enhanced Benefits  Check out counseling resources  provided  Begin personal counseling with LCSW, to reduce and manage symptoms of Grief, Depression and Stress, until well-established with mental health provider Accept all calls from representative at [place] as an effort to establish ongoing mental health counseling and supportive services.  Incorporate into daily practice - relaxation techniques, deep breathing  exercises, and mindfulness meditation strategies. Talk about feelings with friends, family members, spiritual advisor, etc. Contact LCSW directly [phone]), if you have questions, need assistance, or if additional social work needs are identified between now and our next scheduled telephone outreach call. Call [phone] for mental health hotline/crisis line if needed (24/7 available) Try techniques to reduce symptoms of anxiety/negative thinking (deep breathing, distraction, positive self talk, etc)  - develop a personal safety plan - develop a plan to deal with triggers like holidays, anniversaries - exercise at least 2 to 3 times per week - have a plan for how to handle bad days - journal feelings and what helps to feel better or worse - spend time or talk with others at least 2 to 3 times per week - watch for early signs of feeling worse - begin personal counseling - call and visit an old friend - check out volunteer opportunities - join a support group - laugh; watch a funny movie or comedian - learn and use visualization or guided imagery - perform a random act of kindness - practice relaxation or meditation daily - start or continue a personal journal - practice positive thinking and self-talk -continue with compliance of taking medication  -identify current effective and ineffective coping strategies.  -implement positive self-talk in care to increase self-esteem, confidence and feelings of control.  -consider alternative and complementary therapy approaches such as meditation, mindfulness or yoga.  -journaling, prayer, worship services, meditation or pastoral counseling.  -increase participation in pleasurable group activities such as hobbies, singing, sports or volunteering).  -consider the use of meditative movement therapy such as tai chi, yoga or qigong.  -start a regular daily exercise program based on tolerance, ability and patient choice to support positive thinking and  activity     Managing Loss, Adult People experience loss in many different ways throughout their lives. Events such as moving, changing jobs, and losing friends can create a sense of loss. The loss may be as serious as a major health change, divorce, death of a pet, or death of a loved one. All of these types of loss are likely to create a physical and emotional reaction known as grief. Grief is the result of a major change or an absence of something or someone that you count on. Grief is a normal reaction to loss. A variety of factors can affect your grieving experience, including: The nature of your loss. Your relationship to what or whom you lost. Your understanding of grief and how to manage it. Your support system. How to manage lifestyle changes Keep to your normal routine as much as possible. If you have trouble focusing or doing normal activities, it is acceptable to take some time away from your normal routine. Spend time with friends and loved ones. Eat a healthy diet, get plenty of sleep, and rest when you feel tired. How to recognize changes  The way that you deal with your grief will affect your ability to function as you normally do. When grieving, you may experience these changes: Numbness, shock, sadness, anxiety, anger, denial, and guilt. Thoughts about death. Unexpected crying. A physical sensation of emptiness in your stomach. Problems sleeping and eating.  Tiredness (fatigue). Loss of interest in normal activities. Dreaming about or imagining seeing the person who died. A need to remember what or whom you lost. Difficulty thinking about anything other than your loss for a period of time. Relief. If you have been expecting the loss for a while, you may feel a sense of relief when it happens. Follow these instructions at home:    Activity Express your feelings in healthy ways, such as: Talking with others about your loss. It may be helpful to find others who have had  a similar loss, such as a support group. Writing down your feelings in a journal. Doing physical activities to release stress and emotional energy. Doing creative activities like painting, sculpting, or playing or listening to music. Practicing resilience. This is the ability to recover and adjust after facing challenges. Reading some resources that encourage resilience may help you to learn ways to practice those behaviors. General instructions Be patient with yourself and others. Allow the grieving process to happen, and remember that grieving takes time. It is likely that you may never feel completely done with some grief. You may find a way to move on while still cherishing memories and feelings about your loss. Accepting your loss is a process. It can take months or longer to adjust. Keep all follow-up visits as told by your health care provider. This is important. Where to find support To get support for managing loss: Ask your health care provider for help and recommendations, such as grief counseling or therapy. Think about joining a support group for people who are managing a loss. Where to find more information You can find more information about managing loss from: American Society of Clinical Oncology: www.cancer.net American Psychological Association: TVStereos.ch Contact a health care provider if: Your grief is extreme and keeps getting worse. You have ongoing grief that does not improve. Your body shows symptoms of grief, such as illness. You feel depressed, anxious, or lonely. Get help right away if: You have thoughts about hurting yourself or others. If you ever feel like you may hurt yourself or others, or have thoughts about taking your own life, get help right away. You can go to your nearest emergency department or call: Your local emergency services (911 in the [place] A suicide crisis helpline, such as the [place] at 4185129674. This is  open 24 hours a day. Summary Grief is the result of a major change or an absence of someone or something that you count on. Grief is a normal reaction to loss. The depth of grief and the period of recovery depend on the type of loss and your ability to adjust to the change and process your feelings. Processing grief requires patience and a willingness to accept your feelings and talk about your loss with people who are supportive. It is important to find resources that work for you and to realize that people experience grief differently. There is not one grieving process that works for everyone in the same way. Be aware that when grief becomes extreme, it can lead to more severe issues like isolation, depression, anxiety, or suicidal thoughts. Talk with your health care provider if you have any of these issues. This information is not intended to replace advice given to you by your health care provider. Make sure you discuss any questions you have with your health care provider. Document Revised: 10/10/2018 Document Reviewed: 12/20/2016 Elsevier Patient Education  [place] for Managing Grief  When you are experiencing grief, many resources and support are available to help you. You are not alone.    Grief Share (https://www.https://jacobson-moore.net/):    [employer] [facility] [place], [place]  [phone] [address] [place], [place], [place], [place], [place]   [facility] [address] [place], [place]   [place], [place]   If a Hospice or [place] team has been involved in the care of your loved one, you may reach out to your contact  there or locally, you may reach out to [place]:    Hospice and [place] [facility]   [place], [place] [address] [phone]   [facility] [place], [place]  [place], [place], [place] [address] Phone: [phone]  If you are experiencing a Mental Health or [place] or need someone to talk to, please call the Suicide and Crisis Lifeline: 64     The following coping skill education was provided for stress relief and mental health management: "When your car dies or a deadline looms, how do you respond? Long-term, low-grade or acute stress takes a serious toll on your body and mind, so don't ignore feelings of constant tension. Stress is a natural part of life. However, too much stress can harm our health, especially if it continues every day. This is chronic stress and can put you at risk for heart problems like heart disease and depression. Understand what's happening inside your body and learn simple coping skills to combat the negative impacts of everyday stressors.  Types of Stress There are two types of stress: Emotional - types of emotional stress are relationship problems, pressure at work, financial worries, experiencing discrimination or having a major life change. Physical - Examples of physical stress include being sick having pain, not sleeping well, recovery from an injury or having an alcohol and drug use disorder. Fight or Flight Sudden or ongoing stress activates your nervous system and floods your bloodstream with adrenaline and cortisol, two hormones that raise blood pressure, increase heart rate and spike blood sugar. These changes pitch your body into a fight or flight response. That enabled our ancestors to outrun saber-toothed tigers, and it's helpful today for situations like dodging a car accident. But most modern chronic stressors,  such as finances or a challenging relationship, keep your body in that heightened state, which hurts your health. Effects of Too Much Stress If constantly under stress, most of [place] will eventually start to function less well.  Multiple studies link chronic stress to a higher risk of heart disease, stroke, depression, weight gain, memory loss and even premature death, so it's important to recognize the warning signals. Talk to your doctor about ways to manage stress if you're experiencing any of these symptoms: Prolonged periods of poor sleep. Regular, severe headaches. Unexplained weight loss or gain. Feelings of isolation, withdrawal or worthlessness. Constant anger and irritability. Loss of interest in activities. Constant worrying or obsessive thinking. Excessive alcohol or drug use. Inability to concentrate.  10 Ways to Cope with Chronic Stress It's key to recognize stressful situations as they occur because it allows you to focus on managing how you react. We all need to know when to close our eyes and take a deep breath when we feel tension rising. Use these tips to prevent or reduce chronic stress. 1. Rebalance Work and Home All work and no play? If you're spending too much time at the office, intentionally put more dates in your calendar to enjoy time for fun, either alone or with others. 2. Get Regular Exercise Moving your body on a regular basis balances the nervous system and increases blood circulation, helping to flush out stress hormones. Even a daily 20-minute walk makes a difference. Any kind of exercise can lower stress and improve your mood ? just pick activities that you enjoy and make it a regular habit. 3. Eat Well and Limit Alcohol and Stimulants Alcohol, nicotine and caffeine may temporarily relieve stress but have negative health impacts and can make stress worse in the long run. Well-nourished bodies cope better, so start with a good breakfast, add more organic fruits  and vegetables for a well-balanced diet, avoid processed foods and sugar, try herbal tea and drink more water. 4. Connect with Supportive People Talking face to face with another person releases hormones that reduce stress. Lean on those good listeners in your life. 5. [place] Time Do you enjoy gardening, reading, listening to music or some other creative pursuit? Engage in activities that bring you pleasure and joy; research shows that reduces stress by almost half and lowers your heart rate, too. 6. Practice Meditation, Stress Reduction or Yoga Relaxation techniques activate a state of restfulness that counterbalances your body's fight-or-flight hormones. Even if this also means a 10-minute break in a long day: listen to music, read, go for a walk in nature, do a hobby, take a bath or spend time with a friend. Also consider doing a mindfulness exercise or try a daily deep breathing or imagery practice. Deep Breathing Slow, calm and deep breathing can help you relax. Try these steps to focus on your breathing and repeat as needed. Find a comfortable position and close your eyes. Exhale and drop your shoulders. Breathe in through your nose; fill your lungs and then your belly. Think of relaxing your body, quieting your mind and becoming calm and peaceful. Breathe out slowly through your nose, relaxing your belly. Think of releasing tension, pain, worries or distress. Repeat steps three and four until you feel relaxed. Imagery This involves using your mind to excite the senses -- sound, vision, smell, taste and feeling. This may help ease your stress. Begin by getting comfortable and then do some slow breathing. Imagine a place you love being at. It could be somewhere from your childhood, somewhere you vacationed or just a place in your imagination. Feel how it is to be in the place you're imagining. Pay attention to the sounds, air, colors, and who is there with you. This is a place where  you feel cared for and loved. All is well. You are safe. Take in all the smells, sounds, tastes and feelings. As you do, feel your body being nourished and healed. Feel the calm that surrounds you. Breathe in all the good. Breathe out any discomfort or tension. 7. Sleep Enough If you get less than seven to eight hours of sleep, your body won't tolerate stress as well as it could. If stress keeps you up at night,  address the cause, and add extra meditation into your day to make up for the lost z's. Try to get seven to nine hours of sleep each night. Make a regular bedtime schedule. Keep your room dark and cool. Try to avoid computers, TV, cell phones and tablets before bed. 8. Bond with Connections You Enjoy Go out for a coffee with a friend, chat with a neighbor, call a family member, visit with a clergy member, or even hang out with your pet. Clinical studies show that spending even a short time with a companion animal can cut anxiety levels almost in half. 9. Take a Vacation Getting away from it all can reset your stress tolerance by increasing your mental and emotional outlook, which makes you a happier, more productive person upon return. Leave your cellphone and laptop at home! 10. See a Counselor, Coach or Therapist If negative thoughts overwhelm your ability to make positive changes, it's time to seek professional help. Make an appointment today--your health and life are worth it."

## 2022-05-02 NOTE — Patient Outreach (Addendum)
Medicaid Managed Care Social Work Note  05/02/2022 Name:  Caroline Ryan MRN:  [id number] DOB:  1966-07-27  Caroline Ryan is an 56 y.o. year old female who is a primary patient of [place], [place] Derry I, NP.  The Medicaid Managed Care Coordination team was consulted for assistance with:  Mental Health Counseling and Resources Grief Counseling  Caroline Ryan was given information about Medicaid Managed Care Coordination team services today. Caroline Ryan Patient agreed to services and verbal consent obtained.  Engaged with patient  for by telephone forinitial visit in response to referral for case management and/or care coordination services.   Assessments/Interventions:  Review of past medical history, allergies, medications, health status, including review of consultants reports, laboratory and other test data, was performed as part of comprehensive evaluation and provision of chronic care management services.  SDOH: (Social Determinant of Health) assessments and interventions performed: SDOH Interventions    Flowsheet Row Patient Outreach Telephone from 05/02/2022 in [facility] Care Coordination Patient Outreach Telephone from 03/09/2022 in [facility] Visit from 08/09/2020 in [facility] [employer] [facility]  SDOH Interventions     Food Insecurity Interventions -- Other (Comment)  [BSW referral for assistance with transportation to food pantries and non medical errands] 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  Housing Interventions -- Intervention Not Indicated --  Transportation Interventions -- Other (Comment)  [UHC transportion [phone]] [employer]  Depression Interventions/Treatment  Counseling -- --  Stress Interventions Offered [employer], Provide Counseling -- --       Advanced Directives Status:  See Care Plan for related  entries.  Care Plan                 No Known Allergies  Medications Reviewed Today     Reviewed by Gustavus Bryant, LCSW (Social Worker) on 05/02/22 at 1133  Med List Status: <None>   Medication Order Taking? Sig Documenting Provider Last Dose Status Informant  acetaminophen (TYLENOL) 500 MG tablet [id number] No Take 2 tablets (1,000 mg total) by mouth 2 (two) times daily as needed. [name], FNP Taking Active   albuterol (VENTOLIN HFA) 108 (90 Base) MCG/ACT inhaler [id number] No Inhale 1 puff every 6 hours as needed for shortness of breath [name], NP Taking Active   aspirin 81 MG chewable tablet [id number] No Chew by mouth daily. [provider] Taking Active   carvedilol (COREG) 25 MG tablet [id number]  TAKE 1 TABLET (25 MG TOTAL) BY MOUTH 2 (TWO) TIMES DAILY. [name], MD  Active   cetirizine (ZYRTEC) 10 MG tablet [id number] No Take 1 tablet (10 mg total) by mouth daily. [name], NP Taking Active   cloNIDine (CATAPRES - DOSED IN MG/24 HR) 0.1 mg/24hr patch [id number] No Place 1 patch (0.1 mg total) onto the skin once a week. [name], MD Taking Active   cyclobenzaprine (FLEXERIL) 5 MG tablet [id number]  Take 1 tablet (5 mg total) by mouth 3 (three) times daily as needed for muscle spasms. Ivonne Andrew, NP  Active   diclofenac Sodium (VOLTAREN) 1 % GEL [id number] No Apply 4 g topically 4 (four) times daily. [name], NP Taking Active   docusate sodium (COLACE) 100 MG capsule [id number] No Take 1 capsule (100 mg total) by mouth 2 (two) times daily as needed. [name], NP Taking Active   furosemide (LASIX) 20 MG tablet  [id number] No Take 1 tablet (20 mg total) by mouth daily. [name], MD Taking Expired 04/24/22 2359   hydrocortisone 2.5 % cream [id number] No Apply topically 2 (two) times daily. [name], NP Taking Active   hydrOXYzine (ATARAX) 25 MG tablet [id number] No Take 1 tablet (25 mg total) by mouth 3  (three) times daily as needed. [name], NP Taking Active   ibuprofen (ADVIL) 800 MG tablet [id number] No Take 1 tablet (800 mg total) by mouth 3 (three) times daily. [name], MD Taking Active   linaclotide [name]) 72 MCG capsule [id number] No Take 1 capsule (72 mcg total) by mouth daily before breakfast. [name], NP Taking Active   montelukast (SINGULAIR) 10 MG tablet [id number] No TAKE 1 TABLET (10 MG TOTAL) BY MOUTH AT BEDTIME. [name], FNP Taking Expired 04/24/22 2359   NONFORMULARY OR COMPOUNDED ITEM [id number] No Diclofenac 3%, Gabapentin 5%, Lidocaine 5%, Menthol 1%.  Apply 1-2 pumps three to four times per day as needed  Patient not taking: Reported on 03/23/2022   [name], MD Not Taking Active   olmesartan (BENICAR) 40 MG tablet [id number]  TAKE 1 TABLET (40 MG TOTAL) BY MOUTH DAILY. [name], MD  Active   spironolactone (ALDACTONE) 100 MG tablet [id number]  TAKE 1 TABLET (100 MG TOTAL) BY MOUTH DAILY. 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   Unilateral primary osteoarthritis, left knee 10/22/2019   Unilateral primary osteoarthritis, right knee 10/22/2019   BMI 50.0-59.9, adult (HCC) 10/22/2019   Grieving 06/10/2019   Pruritus 06/10/2019   Lower extremity edema 11/02/2018   Post-operative state 10/10/2017   Symptomatic anemia 05/20/2017   Arthralgia of both knees 06/27/2015   BACK STRAIN, LUMBAR 03/16/2010   KNEE PAIN, BILATERAL 06/30/2009   OBESITY 04/13/2009   Edema 04/13/2009   FIBROADENOMA,  BREAST 04/20/2008   HYPERTENSION, BENIGN ESSENTIAL 04/14/2008   Allergic rhinitis 04/14/2008   Asthma 04/14/2008    Conditions to be addressed/monitored per PCP order:  Depression  Care Plan : LCSW Plan of Care  Updates made by Gustavus Bryant, LCSW since 05/02/2022 12:00 AM     Problem: Depression Identification (Depression)      Long-Range Goal: Depressive Symptoms Identified   Start Date: 05/02/2022  Priority: High  Note:   Priority: High  Timeframe:  Long-Range Goal Priority:  High Start Date: 05/02/22              Expected End Date:  ongoing                     Follow Up Date--05/16/22 at 11 am  - check out bereavement counseling and long term counseling options -resources provided for you to write down due to low usage of email - keep 90 percent of counseling appointments - schedule initial counseling appointment    Why is this important?             Beating grief and depression may take some time.  If you don't feel better right away, don't give up on your treatment plan.    Current barriers:   Chronic Mental Health needs related to depression and grief from losing her daughter in April of 2023. Patient requires Support, Education, Resources, Referrals, Advocacy, and Care Coordination, in order to meet Unmet Mental Health Needs. Patient will implement clinical interventions discussed today to decrease symptoms of grief and increase knowledge and/or ability of: coping skills. Mental Health Concerns and Social Isolation Patient lacks knowledge of available community counseling agencies and resources.  Clinical Goal(s): verbalize understanding of plan for management of Grief, Depression, and Stress and demonstrate a reduction in symptoms. Patient will connect with a provider for ongoing mental health treatment, increase coping skills, healthy habits, self-management skills, and stress reduction        Clinical Interventions:  Assessed patient's previous and  current treatment, coping skills, support system and barriers to care. Patient is experiencing grief due to the loss of a loved one but prefers long term therapy over brief grief counseling. Verbalization of feelings encouraged, motivational interviewing employed Emotional support provided, positive coping strategies explored Self care emphasized Patient is agreeable to referral to [facility] for individual counseling only. She does not want psychiatry. Patient reports significant worsening grief and depression impacting her ability to function appropriately and carry out daily task. LCSW provided education on relaxation techniques such as meditation, deep breathing, massage, grounding exercises or yoga that can activate the body's relaxation response and ease symptoms of stress and anxiety. LCSW ask that when pt is struggling with difficult emotions and racing thoughts that they start this relaxation response process. LCSW provided extensive education on healthy coping skills for anxiety. SW used active and reflective listening, validated patient's feelings/concerns, and provided emotional support. Patient will work on implementing appropriate self-care habits into their daily routine such as: staying positive, writing a gratitude list, drinking water, staying active around the house, taking their medications as prescribed, combating negative thoughts or emotions and staying connected with their family and friends. Positive reinforcement provided for this decision to work on this. Motivational Interviewing employed Depression screen reviewed  PHQ2/ PHQ9 completed Mindfulness or Relaxation training provided Active listening / Reflection utilized  Advance Care and HCPOA education provided Emotional Support Provided Problem Solving /Task Center strategies reviewed Provided psychoeducation for mental health needs  Provided brief CBT  Reviewed mental health medications and discussed importance of compliance:   Quality of sleep assessed & Sleep Hygiene techniques promoted  Participation in counseling encouraged  Verbalization of feelings encouraged  Suicidal Ideation/Homicidal Ideation assessed: Patient denies SI/HI  Review resources, discussed options and provided patient information about  Mental Health Resources Inter-disciplinary care team collaboration (see longitudinal plan of care) Patient Goals/Self-Care Activities: Over the next 120 days Attend scheduled medical appointments Utilize healthy coping skills and supportive resources discussed Contact PCP with any questions or concerns Keep 90 percent of counseling appointments Call your insurance provider for more information about your Enhanced Benefits  Check out counseling resources provided  Begin personal counseling with LCSW, to reduce and manage symptoms of Grief, Depression and Stress, until well-established with mental health provider Accept all calls from representative at [email or web address] as an effort to establish ongoing mental health counseling and supportive services.  Incorporate into daily practice - relaxation techniques, deep breathing exercises, and mindfulness meditation strategies. Talk about feelings with friends, family members, spiritual advisor, etc. Contact LCSW directly [phone]), if you have questions, need assistance, or if additional social work needs are identified  between now and our next scheduled telephone outreach call. Call 988 for mental health hotline/crisis line if needed (24/7 available) Try techniques to reduce symptoms of anxiety/negative thinking (deep breathing, distraction, positive self talk, etc)  - develop a personal safety plan - develop a plan to deal with triggers like holidays, anniversaries - exercise at least 2 to 3 times per week - have a plan for how to handle bad days - journal feelings and what helps to feel better or worse - spend time or talk with others at least 2 to 3 times per  week - watch for early signs of feeling worse - begin personal counseling - call and visit an old friend - check out volunteer opportunities - join a support group - laugh; watch a funny movie or comedian - learn and use visualization or guided imagery - perform a random act of kindness - practice relaxation or meditation daily - start or continue a personal journal - practice positive thinking and self-talk -continue with compliance of taking medication  -identify current effective and ineffective coping strategies.  -implement positive self-talk in care to increase self-esteem, confidence and feelings of control.  -consider alternative and complementary therapy approaches such as meditation, mindfulness or yoga.  -journaling, prayer, worship services, meditation or pastoral counseling.  -increase participation in pleasurable group activities such as hobbies, singing, sports or volunteering).  -consider the use of meditative movement therapy such as tai chi, yoga or qigong.  -start a regular daily exercise program based on tolerance, ability and patient choice to support positive thinking and activity     Managing Loss, Adult People experience loss in many different ways throughout their lives. Events such as moving, changing jobs, and losing friends can create a sense of loss. The loss may be as serious as a major health change, divorce, death of a pet, or death of a loved one. All of these types of loss are likely to create a physical and emotional reaction known as grief. Grief is the result of a major change or an absence of something or someone that you count on. Grief is a normal reaction to loss. A variety of factors can affect your grieving experience, including: The nature of your loss. Your relationship to what or whom you lost. Your understanding of grief and how to manage it. Your support system. How to manage lifestyle changes Keep to your normal routine as much as  possible. If you have trouble focusing or doing normal activities, it is acceptable to take some time away from your normal routine. Spend time with friends and loved ones. Eat a healthy diet, get plenty of sleep, and rest when you feel tired. How to recognize changes  The way that you deal with your grief will affect your ability to function as you normally do. When grieving, you may experience these changes: Numbness, shock, sadness, anxiety, anger, denial, and guilt. Thoughts about death. Unexpected crying. A physical sensation of emptiness in your stomach. Problems sleeping and eating. Tiredness (fatigue). Loss of interest in normal activities. Dreaming about or imagining seeing the person who died. A need to remember what or whom you lost. Difficulty thinking about anything other than your loss for a period of time. Relief. If you have been expecting the loss for a while, you may feel a sense of relief when it happens. Follow these instructions at home:    Activity Express your feelings in healthy ways, such as: Talking with others about your loss. It may  be helpful to find others who have had a similar loss, such as a support group. Writing down your feelings in a journal. Doing physical activities to release stress and emotional energy. Doing creative activities like painting, sculpting, or playing or listening to music. Practicing resilience. This is the ability to recover and adjust after facing challenges. Reading some resources that encourage resilience may help you to learn ways to practice those behaviors. General instructions Be patient with yourself and others. Allow the grieving process to happen, and remember that grieving takes time. It is likely that you may never feel completely done with some grief. You may find a way to move on while still cherishing memories and feelings about your loss. Accepting your loss is a process. It can take months or longer to  adjust. Keep all follow-up visits as told by your health care provider. This is important. Where to find support To get support for managing loss: Ask your health care provider for help and recommendations, such as grief counseling or therapy. Think about joining a support group for people who are managing a loss. Where to find more information You can find more information about managing loss from: American Society of Clinical Oncology: www.cancer.net American Psychological Association: [employer] Contact a health care provider if: Your grief is extreme and keeps getting worse. You have ongoing grief that does not improve. Your body shows symptoms of grief, such as illness. You feel depressed, anxious, or lonely. Get help right away if: You have thoughts about hurting yourself or others. If you ever feel like you may hurt yourself or others, or have thoughts about taking your own life, get help right away. You can go to your nearest emergency department or call: Your local emergency services (911 in the [place] A suicide crisis helpline, such as the National Suicide Prevention Lifeline at (551)055-5224. This is open 24 hours a day. Summary Grief is the result of a major change or an absence of someone or something that you count on. Grief is a normal reaction to loss. The depth of grief and the period of recovery depend on the type of loss and your ability to adjust to the change and process your feelings. Processing grief requires patience and a willingness to accept your feelings and talk about your loss with people who are supportive. It is important to find resources that work for you and to realize that people experience grief differently. There is not one grieving process that works for everyone in the same way. Be aware that when grief becomes extreme, it can lead to more severe issues like isolation, depression, anxiety, or suicidal thoughts. Talk with your health care provider if  you have any of these issues. This information is not intended to replace advice given to you by your health care provider. Make sure you discuss any questions you have with your health care provider. Document Revised: 10/10/2018 Document Reviewed: 12/20/2016 Elsevier Patient Education  2020 [employer].  Help for Managing Grief   When you are experiencing grief, many resources and support are available to help you. You are not alone.    Grief Share (https://www.SunglassSpecialist.gl):    [employer] of [address] [place], [place]      [employer]  [phone] [address] [place], [place]     [employer] [address] [place], [place]      [employer] [address] [place], [place]  [phone]  [facility] [address] [place], [place] [phone]   [facility] [address] [place], [place]     [phone]   [facility] [address] [place], [place]     [phone]   If a Hospice or Palliative Care team has been involved in the care of your loved one, you may reach out to your contact there or locally, you may reach out to [facility]:    [facility]   [address] [place], [place] [address] [phone]   [facility]  [facility] in [place], [place]  [address], [place], [place] [address] Phone: [phone]  If you are experiencing a Mental Health or Behavioral Health Crisis or need someone to talk to, please call the Suicide and Crisis Lifeline: 988        Follow up:  Patient agrees to Care Plan and Follow-up.  Plan: The Managed Medicaid care management team will reach out to the patient again over the next 30 days.  Date/time of next scheduled Social Work care management/care coordination outreach:  9/26 at 11 am  Dickie La, BSW, MSW,  [employer] Managed Medicaid LCSW [facility]  [facility] [employer] [email or web address] Phone: [phone]

## 2022-05-03 ENCOUNTER — Other Ambulatory Visit: Payer: Self-pay

## 2022-05-03 ENCOUNTER — Other Ambulatory Visit: Payer: Self-pay | Admitting: Physical Medicine and Rehabilitation

## 2022-05-03 DIAGNOSIS — F321 Major depressive disorder, single episode, moderate: Secondary | ICD-10-CM | POA: Diagnosis not present

## 2022-05-04 ENCOUNTER — Other Ambulatory Visit: Payer: Self-pay

## 2022-05-04 DIAGNOSIS — F321 Major depressive disorder, single episode, moderate: Secondary | ICD-10-CM | POA: Diagnosis not present

## 2022-05-05 DIAGNOSIS — F321 Major depressive disorder, single episode, moderate: Secondary | ICD-10-CM | POA: Diagnosis not present

## 2022-05-07 ENCOUNTER — Other Ambulatory Visit: Payer: Self-pay

## 2022-05-07 DIAGNOSIS — F321 Major depressive disorder, single episode, moderate: Secondary | ICD-10-CM | POA: Diagnosis not present

## 2022-05-08 DIAGNOSIS — F321 Major depressive disorder, single episode, moderate: Secondary | ICD-10-CM | POA: Diagnosis not present

## 2022-05-09 DIAGNOSIS — F321 Major depressive disorder, single episode, moderate: Secondary | ICD-10-CM | POA: Diagnosis not present

## 2022-05-10 DIAGNOSIS — F321 Major depressive disorder, single episode, moderate: Secondary | ICD-10-CM | POA: Diagnosis not present

## 2022-05-11 DIAGNOSIS — F321 Major depressive disorder, single episode, moderate: Secondary | ICD-10-CM | POA: Diagnosis not present

## 2022-05-12 DIAGNOSIS — F321 Major depressive disorder, single episode, moderate: Secondary | ICD-10-CM | POA: Diagnosis not present

## 2022-05-14 ENCOUNTER — Other Ambulatory Visit: Payer: Self-pay

## 2022-05-14 ENCOUNTER — Ambulatory Visit (INDEPENDENT_AMBULATORY_CARE_PROVIDER_SITE_OTHER): Payer: Medicaid Other | Admitting: Nurse Practitioner

## 2022-05-14 ENCOUNTER — Encounter: Payer: Self-pay | Admitting: Nurse Practitioner

## 2022-05-14 VITALS — BP 121/76 | HR 86 | Temp 97.4°F | Ht 61.0 in | Wt 252.6 lb

## 2022-05-14 DIAGNOSIS — B351 Tinea unguium: Secondary | ICD-10-CM

## 2022-05-14 DIAGNOSIS — G8929 Other chronic pain: Secondary | ICD-10-CM

## 2022-05-14 DIAGNOSIS — I10 Essential (primary) hypertension: Secondary | ICD-10-CM | POA: Diagnosis not present

## 2022-05-14 DIAGNOSIS — M25561 Pain in right knee: Secondary | ICD-10-CM | POA: Diagnosis not present

## 2022-05-14 DIAGNOSIS — M25562 Pain in left knee: Secondary | ICD-10-CM | POA: Diagnosis not present

## 2022-05-14 DIAGNOSIS — F321 Major depressive disorder, single episode, moderate: Secondary | ICD-10-CM | POA: Diagnosis not present

## 2022-05-14 MED ORDER — METHOCARBAMOL 750 MG PO TABS
750.0000 mg | ORAL_TABLET | Freq: Three times a day (TID) | ORAL | 0 refills | Status: AC
  Filled 2022-05-14 – 2022-05-21 (×2): qty 90, 30d supply, fill #0

## 2022-05-14 MED ORDER — EFINACONAZOLE 10 % EX SOLN
1.0000 | Freq: Every day | CUTANEOUS | 3 refills | Status: AC
  Filled 2022-05-14: qty 8, fill #0

## 2022-05-14 NOTE — Progress Notes (Unsigned)
$'@Patient'v$  ID: Caroline Ryan, female    DOB: 08-Apr-1966, 56 y.o.   MRN: [id number]  Chief Complaint  Patient presents with   Follow-up    Pt is here for 6 month follow up visit. Pt states both knees has pain. Pt is requesting a refill on all medications    Referring provider: No ref. provider found   HPI  Caroline Ryan 56 y.o. female  has a past medical history of Anemia, Asthma, Depression, Dilated aortic root ([place]), Eczema, Fibroid, Headache(784.0), History of blood transfusion, Hypertension, Knee pain, bilateral, Lower extremity edema, Mitral regurgitation, Seasonal allergies, SVD (spontaneous vaginal delivery), and Vaginal polyp (05/2020).   States that she suffers from significant arthralgia in bilateral knees, which is currently being managed by orthopedics. Specialist recommend surgical repair of bilateral knees, but she has been unable to receive surgery due weight. Requesting referral to pain management. States that she has attempted weight loss through conventional means with no success. Also complains today of toenail fungus and is requesting medication for this. Denies f/c/s, n/v/d, hemoptysis, PND, leg swelling Denies chest pain or edema       No Known Allergies  Immunization History  Administered Date(s) Administered   Influenza Whole 06/30/2009   Influenza,inj,Quad PF,6+ Mos 06/27/2015, 07/04/2016, 06/09/2018, 06/10/2019   PFIZER(Purple Top)SARS-COV-2 Vaccination 12/25/2019, 01/15/2020   Pneumococcal Polysaccharide-23 04/14/2008, 06/27/2015   Td 10/18/2005   Tdap 07/04/2016    Past Medical History:  Diagnosis Date   Anemia    Asthma    Depression    no meds   Dilated aortic root (HCC)    68m by echo 07/2021   Eczema    Fibroid    Headache(784.0)    otc prn med   History of blood transfusion    Hypertension    Knee pain, bilateral    arthralgia knee pain bilateral   Lower extremity edema    Mitral regurgitation    Mild to moderate by echo  07/2021   Seasonal allergies    SVD (spontaneous vaginal delivery)    x 3   Vaginal polyp 05/2020    Tobacco History: Social History   Tobacco Use  Smoking Status Former   Packs/day: 0.25   Years: 2.00   Total pack years: 0.50   Types: Cigarettes   Quit date: 08/20/2014   Years since quitting: 7.7  Smokeless Tobacco Never   Counseling given: Not Answered   Outpatient Encounter Medications as of 05/14/2022  Medication Sig   albuterol (VENTOLIN HFA) 108 (90 Base) MCG/ACT inhaler Inhale 1 puff every 6 hours as needed for shortness of breath   aspirin 81 MG chewable tablet Chew by mouth daily.   carvedilol (COREG) 25 MG tablet TAKE 1 TABLET (25 MG TOTAL) BY MOUTH 2 (TWO) TIMES DAILY.   cetirizine (ZYRTEC) 10 MG tablet Take 1 tablet (10 mg total) by mouth daily.   cloNIDine (CATAPRES - DOSED IN MG/24 HR) 0.1 mg/24hr patch Place 1 patch (0.1 mg total) onto the skin once a week.   cyclobenzaprine (FLEXERIL) 5 MG tablet Take 1 tablet (5 mg total) by mouth 3 (three) times daily as needed for muscle spasms.   diclofenac Sodium (VOLTAREN) 1 % GEL Apply 4 g topically 4 (four) times daily.   Efinaconazole 10 % SOLN Apply 1 Application topically daily.   hydrocortisone 2.5 % cream Apply topically 2 (two) times daily.   hydrOXYzine (ATARAX) 25 MG tablet Take 1 tablet (25 mg total) by mouth 3 (three) times daily as  needed.   ibuprofen (ADVIL) 800 MG tablet Take 1 tablet (800 mg total) by mouth 3 (three) times daily.   linaclotide (LINZESS) 72 MCG capsule Take 1 capsule (72 mcg total) by mouth daily before breakfast.   methocarbamol (ROBAXIN) 750 MG tablet Take 1 tablet (750 mg total) by mouth 3 (three) times daily.   olmesartan (BENICAR) 40 MG tablet TAKE 1 TABLET (40 MG TOTAL) BY MOUTH DAILY.   spironolactone (ALDACTONE) 100 MG tablet TAKE 1 TABLET (100 MG TOTAL) BY MOUTH DAILY.   acetaminophen (TYLENOL) 500 MG tablet Take 2 tablets (1,000 mg total) by mouth 2 (two) times daily as needed.  (Patient not taking: Reported on 05/14/2022)   docusate sodium (COLACE) 100 MG capsule Take 1 capsule (100 mg total) by mouth 2 (two) times daily as needed.   furosemide (LASIX) 20 MG tablet Take 1 tablet (20 mg total) by mouth daily.   montelukast (SINGULAIR) 10 MG tablet TAKE 1 TABLET (10 MG TOTAL) BY MOUTH AT BEDTIME.   NONFORMULARY OR COMPOUNDED ITEM Diclofenac 3%, Gabapentin 5%, Lidocaine 5%, Menthol 1%.  Apply 1-2 pumps three to four times per day as needed (Patient not taking: Reported on 03/23/2022)   Facility-Administered Encounter Medications as of 05/14/2022  Medication   Triamcinolone Acetonide (ZILRETTA) intra-articular injection 32 mg   Triamcinolone Acetonide (ZILRETTA) intra-articular injection 32 mg     Review of Systems  Review of Systems  Constitutional: Negative.   HENT: Negative.    Cardiovascular: Negative.   Gastrointestinal: Negative.   Musculoskeletal:        Chronic bilateral knee pain  Skin:        Toenail fungus  Allergic/Immunologic: Negative.   Neurological: Negative.   Psychiatric/Behavioral: Negative.         Physical Exam  BP 121/76 (BP Location: Right Arm, Patient Position: Sitting, Cuff Size: Large)   Pulse 86   Temp (!) 97.4 F (36.3 C)   Ht '5\' 1"'$  (1.549 m)   Wt 252 lb 9.6 oz (114.6 kg)   LMP 04/03/2017 (Approximate)   SpO2 100%   BMI 47.73 kg/m   Wt Readings from Last 5 Encounters:  05/14/22 252 lb 9.6 oz (114.6 kg)  03/12/22 263 lb 12.8 oz (119.7 kg)  01/02/22 264 lb (119.7 kg)  12/26/21 263 lb 9.6 oz (119.6 kg)  11/23/21 261 lb (118.4 kg)     Physical Exam Vitals and nursing note reviewed.  Constitutional:      General: She is not in acute distress.    Appearance: She is well-developed.  Cardiovascular:     Rate and Rhythm: Normal rate and regular rhythm.  Pulmonary:     Effort: Pulmonary effort is normal.     Breath sounds: Normal breath sounds.  Skin:    Comments: Toenail fungus noted to right toenails.    Neurological:     Mental Status: She is alert and oriented to person, place, and time.      Lab Results:  CBC    Component Value Date/Time   WBC 7.8 05/14/2022 1608   WBC 9.6 10/11/2017 0516   RBC 3.60 (L) 05/14/2022 1608   RBC 2.96 (L) 10/11/2017 0516   HGB 11.0 (L) 05/14/2022 1608   HCT 34.4 05/14/2022 1608   PLT 183 05/14/2022 1608   MCV 96 05/14/2022 1608   MCH 30.6 05/14/2022 1608   MCH 29.7 10/11/2017 0516   MCHC 32.0 05/14/2022 1608   MCHC 32.4 10/11/2017 0516   RDW 11.7 05/14/2022 1608  LYMPHSABS 1.7 12/24/2018 1404   MONOABS 558 03/14/2017 1515   EOSABS 0.1 12/24/2018 1404   BASOSABS 0.1 12/24/2018 1404    BMET    Component Value Date/Time   NA 142 05/14/2022 1608   K 4.9 05/14/2022 1608   CL 103 05/14/2022 1608   CO2 25 05/14/2022 1608   GLUCOSE 92 05/14/2022 1608   GLUCOSE 110 (H) 10/11/2017 0516   BUN 24 05/14/2022 1608   CREATININE 0.91 05/14/2022 1608   CREATININE 0.75 06/20/2017 1201   CALCIUM 10.6 (H) 05/14/2022 1608   GFRNONAA 85 02/16/2020 1114   GFRNONAA 91 06/20/2017 1201   GFRAA 98 02/16/2020 1114   GFRAA 105 06/20/2017 1201    BNP    Component Value Date/Time   BNP 41.0 03/23/2021 1154    ProBNP No results found for: "PROBNP"  Imaging: MM 3D SCREEN BREAST BILATERAL  Result Date: 04/20/2022 CLINICAL DATA:  Screening. EXAM: DIGITAL SCREENING BILATERAL MAMMOGRAM WITH TOMOSYNTHESIS AND CAD TECHNIQUE: Bilateral screening digital craniocaudal and mediolateral oblique mammograms were obtained. Bilateral screening digital breast tomosynthesis was performed. The images were evaluated with computer-aided detection. COMPARISON:  Previous exam(s). ACR Breast Density Category b: There are scattered areas of fibroglandular density. FINDINGS: There are no findings suspicious for malignancy. IMPRESSION: No mammographic evidence of malignancy. A result letter of this screening mammogram will be mailed directly to the patient. RECOMMENDATION:  Screening mammogram in one year. (Code:SM-B-01Y) BI-RADS CATEGORY  1: Negative. Electronically Signed   By: Dorise Bullion III M.D.   On: 04/20/2022 11:00     Assessment & Plan:   Chronic pain of both knees - Ambulatory referral to Physical Medicine Rehab - methocarbamol (ROBAXIN) 750 MG tablet; Take 1 tablet (750 mg total) by mouth 3 (three) times daily.  Dispense: 90 tablet; Refill: 0  2. Toenail fungus  - Efinaconazole 10 % SOLN; Apply 1 Application topically daily.  Dispense: 8 mL; Refill: 3  Follow up:  Follow up in 6 months or sooner if needed     Fenton Foy, NP 05/16/2022

## 2022-05-14 NOTE — Patient Instructions (Signed)
1. Chronic pain of both knees  - Ambulatory referral to Physical Medicine Rehab - methocarbamol (ROBAXIN) 750 MG tablet; Take 1 tablet (750 mg total) by mouth 3 (three) times daily.  Dispense: 90 tablet; Refill: 0  2. Toenail fungus  - Efinaconazole 10 % SOLN; Apply 1 Application topically daily.  Dispense: 8 mL; Refill: 3  Follow up:  Follow up in 6 months or sooner if needed

## 2022-05-15 DIAGNOSIS — F321 Major depressive disorder, single episode, moderate: Secondary | ICD-10-CM | POA: Diagnosis not present

## 2022-05-15 LAB — COMPREHENSIVE METABOLIC PANEL
ALT: 18 IU/L (ref 0–32)
AST: 16 IU/L (ref 0–40)
Albumin/Globulin Ratio: 1.5 (ref 1.2–2.2)
Albumin: 4.4 g/dL (ref 3.8–4.9)
Alkaline Phosphatase: 107 IU/L (ref 44–121)
BUN/Creatinine Ratio: 26 — ABNORMAL HIGH (ref 9–23)
BUN: 24 mg/dL (ref 6–24)
Bilirubin Total: 0.4 mg/dL (ref 0.0–1.2)
CO2: 25 mmol/L (ref 20–29)
Calcium: 10.6 mg/dL — ABNORMAL HIGH (ref 8.7–10.2)
Chloride: 103 mmol/L (ref 96–106)
Creatinine, Ser: 0.91 mg/dL (ref 0.57–1.00)
Globulin, Total: 3 g/dL (ref 1.5–4.5)
Glucose: 92 mg/dL (ref 70–99)
Potassium: 4.9 mmol/L (ref 3.5–5.2)
Sodium: 142 mmol/L (ref 134–144)
Total Protein: 7.4 g/dL (ref 6.0–8.5)
eGFR: 73 mL/min/{1.73_m2} (ref >59)

## 2022-05-15 LAB — CBC
Hematocrit: 34.4 % (ref 34.0–46.6)
Hemoglobin: 11 g/dL — ABNORMAL LOW (ref 11.1–15.9)
MCH: 30.6 pg (ref 26.6–33.0)
MCHC: 32 g/dL (ref 31.5–35.7)
MCV: 96 fL (ref 79–97)
Platelets: 183 10*3/uL (ref 150–450)
RBC: 3.6 x10E6/uL — ABNORMAL LOW (ref 3.77–5.28)
RDW: 11.7 % (ref 11.7–15.4)
WBC: 7.8 10*3/uL (ref 3.4–10.8)

## 2022-05-16 ENCOUNTER — Other Ambulatory Visit: Payer: Self-pay

## 2022-05-16 ENCOUNTER — Encounter: Payer: Self-pay | Admitting: Nurse Practitioner

## 2022-05-16 DIAGNOSIS — F321 Major depressive disorder, single episode, moderate: Secondary | ICD-10-CM | POA: Diagnosis not present

## 2022-05-16 DIAGNOSIS — G8929 Other chronic pain: Secondary | ICD-10-CM | POA: Insufficient documentation

## 2022-05-16 NOTE — Patient Instructions (Signed)
Arleny Denise Melle ,   The Medicaid Managed Care Team is available to provide assistance to you with your healthcare needs at no cost and as a benefit of your Medicaid Health plan. I'm sorry I was unable to reach you today for our scheduled appointment. Our care guide will call you to reschedule our telephone appointment. Please call me at the number below. I am available to be of assistance to you regarding your healthcare needs. .   Thank you,   Dequandre Cordova, BSW, MSW, LCSW Managed Medicaid LCSW [facility]  [facility] [email or web address] Phone: [phone]   

## 2022-05-16 NOTE — Patient Outreach (Signed)
Medicaid Managed Care   Unsuccessful Attempt Note   05/16/2022 Name: Caroline Ryan MRN: [id number] DOB: [birth year]  Referred by: Ivonne Andrew, NP Reason for referral : High Risk Managed Medicaid   An unsuccessful telephone outreach was attempted today. The patient was referred to the case management team for assistance with care management and care coordination.    Follow Up Plan: A HIPAA compliant phone message was left for the patient providing contact information and requesting a return call.   Dickie La, BSW, MSW, [employer] Managed Medicaid LCSW [facility]  [facility] [email or web address] Phone: [phone]

## 2022-05-16 NOTE — Assessment & Plan Note (Signed)
-   Ambulatory referral to Physical Medicine Rehab - methocarbamol (ROBAXIN) 750 MG tablet; Take 1 tablet (750 mg total) by mouth 3 (three) times daily.  Dispense: 90 tablet; Refill: 0  2. Toenail fungus  - Efinaconazole 10 % SOLN; Apply 1 Application topically daily.  Dispense: 8 mL; Refill: 3  Follow up:  Follow up in 6 months or sooner if needed

## 2022-05-17 DIAGNOSIS — F321 Major depressive disorder, single episode, moderate: Secondary | ICD-10-CM | POA: Diagnosis not present

## 2022-05-18 DIAGNOSIS — F321 Major depressive disorder, single episode, moderate: Secondary | ICD-10-CM | POA: Diagnosis not present

## 2022-05-19 DIAGNOSIS — F321 Major depressive disorder, single episode, moderate: Secondary | ICD-10-CM | POA: Diagnosis not present

## 2022-05-21 ENCOUNTER — Other Ambulatory Visit: Payer: Self-pay | Admitting: Licensed Clinical Social Worker

## 2022-05-21 ENCOUNTER — Other Ambulatory Visit: Payer: Self-pay

## 2022-05-21 DIAGNOSIS — F321 Major depressive disorder, single episode, moderate: Secondary | ICD-10-CM | POA: Diagnosis not present

## 2022-05-21 NOTE — Patient Outreach (Signed)
Medicaid Managed Care Social Work Note  05/21/2022 Name:  Caroline Ryan MRN:  [id number] DOB:  August 31, 1965  Caroline Ryan is an 56 y.o. year old female who is a primary patient of Caroline Andrew, NP.  The Medicaid Managed Care Coordination team was consulted for assistance with:  Mental Health Counseling and Resources  [name] was given information about Medicaid Managed Care Coordination team services today. Caroline Ryan Patient agreed to services and verbal consent obtained.  Engaged with patient  for by telephone forfollow up visit in response to referral for case management and/or care coordination services.   Assessments/Interventions:  Review of past medical history, allergies, medications, health status, including review of consultants reports, laboratory and other test data, was performed as part of comprehensive evaluation and provision of chronic care management services.  SDOH: (Social Determinant of Health) assessments and interventions performed: SDOH Interventions    Flowsheet Row Patient Outreach Telephone from 05/21/2022 in [facility] Care Coordination Patient Outreach Telephone from 05/02/2022 in [facility] Coordination Patient Outreach Telephone from 03/09/2022 in [facility] [employer] Office Visit from 08/09/2020 in [facility] [employer] [facility] [employer] [facility]  SDOH Interventions      Food Insecurity Interventions -- -- Other (Comment)  [BSW referral for assistance with transportation to food pantries and non medical errands] 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  Housing Interventions -- -- Intervention Not Indicated --  Transportation Interventions -- -- Other (Comment)  [UHC transportion [phone]] [employer]  Depression Interventions/Treatment  Counseling Counseling -- --  Stress Interventions Provide Counseling, Offered  [employer] Resources Offered [employer], Provide Counseling -- --       Advanced Directives Status:  See Care Plan for related entries.  Care Plan                 No Known Allergies  Medications Reviewed Today     Reviewed by Caroline Andrew, NP (Nurse Practitioner) on 05/16/22 at [phone]  Med List Status: <None>   Medication Order Taking? Sig Documenting Provider Last Dose Status Informant  acetaminophen (TYLENOL) 500 MG tablet [id number] No Take 2 tablets (1,000 mg total) by mouth 2 (two) times daily as needed.  Patient not taking: Reported on 05/14/2022   Caroline Locks, FNP Not Taking Active   albuterol (VENTOLIN HFA) 108 (90 Base) MCG/ACT inhaler [id number] Yes Inhale 1 puff every 6 hours as needed for shortness of breath Caroline Crook I, NP Taking Active   aspirin 81 MG chewable tablet [id number] Yes Chew by mouth daily. [provider] Taking Active   carvedilol (COREG) 25 MG tablet [id number] Yes TAKE 1 TABLET (25 MG TOTAL) BY MOUTH 2 (TWO) TIMES DAILY. Caroline Reichert, MD Taking Active   cetirizine (ZYRTEC) 10 MG tablet [id number] Yes Take 1 tablet (10 mg total) by mouth daily. Caroline Crook I, NP Taking Active   cloNIDine (CATAPRES - DOSED IN MG/24 HR) 0.1 mg/24hr patch [id number] Yes Place 1 patch (0.1 mg total) onto the skin once a week. Caroline Reichert, MD Taking Active   cyclobenzaprine (FLEXERIL) 5 MG tablet [id number] Yes Take 1 tablet (5 mg total) by mouth 3 (three) times daily as needed for muscle spasms. Caroline Andrew, NP Taking Active   diclofenac Sodium (VOLTAREN) 1 % GEL [id number] Yes Apply 4 g topically 4 (four) times daily. Caroline Crook I, NP Taking Active   docusate sodium (COLACE)  100 MG capsule [id number]  Take 1 capsule (100 mg total) by mouth 2 (two) times daily as needed. Caroline Crook I, NP  Active   Efinaconazole 10 % SOLN [id number] Yes Apply 1 Application topically daily. Caroline Andrew, NP  Active   furosemide  (LASIX) 20 MG tablet [id number]  Take 1 tablet (20 mg total) by mouth daily. Caroline Chin, MD  Expired 04/24/22 2359   hydrocortisone 2.5 % cream [id number] Yes Apply topically 2 (two) times daily. Caroline Crook I, NP Taking Active   hydrOXYzine (ATARAX) 25 MG tablet [id number] Yes Take 1 tablet (25 mg total) by mouth 3 (three) times daily as needed. Caroline Crook I, NP Taking Active   ibuprofen (ADVIL) 800 MG tablet [id number] Yes Take 1 tablet (800 mg total) by mouth 3 (three) times daily. Caroline Chin, MD Taking Active   linaclotide Caroline Ryan) 72 MCG capsule [id number] Yes Take 1 capsule (72 mcg total) by mouth daily before breakfast. Caroline Crook I, NP Taking Active   methocarbamol (ROBAXIN) 750 MG tablet [id number] Yes Take 1 tablet (750 mg total) by mouth 3 (three) times daily. Caroline Andrew, NP  Active   montelukast (SINGULAIR) 10 MG tablet [id number]  TAKE 1 TABLET (10 MG TOTAL) BY MOUTH AT BEDTIME. Caroline Locks, FNP  Expired 04/24/22 2359   NONFORMULARY OR COMPOUNDED ITEM [id number] No Diclofenac 3%, Gabapentin 5%, Lidocaine 5%, Menthol 1%.  Apply 1-2 pumps three to four times per day as needed  Patient not taking: Reported on 03/23/2022   Caroline Chin, MD Not Taking Active   olmesartan (BENICAR) 40 MG tablet [id number] Yes TAKE 1 TABLET (40 MG TOTAL) BY MOUTH DAILY. Caroline Reichert, MD Taking Active   spironolactone (ALDACTONE) 100 MG tablet [id number] Yes TAKE 1 TABLET (100 MG TOTAL) BY MOUTH DAILY. Caroline Reichert, MD Taking Active   Triamcinolone Acetonide Caroline Ryan) intra-articular injection 32 mg [id number]   Caroline Chin, MD  Active   Triamcinolone Acetonide (ZILRETTA) intra-articular injection 32 mg [id number]   Caroline Chin, MD  Active             Patient Active Problem List   Diagnosis Date Noted   Chronic pain of both knees 05/16/2022   OSA (obstructive sleep apnea) 11/09/2021   Mitral regurgitation 09/07/2021   Dilated aortic root (HCC)  09/07/2021   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/2020   Unilateral primary osteoarthritis, left knee 10/22/2019   Unilateral primary osteoarthritis, right knee 10/22/2019   BMI 50.0-59.9, adult (HCC) 10/22/2019   Grieving 06/10/2019   Pruritus 06/10/2019   Lower extremity edema 11/02/2018   Post-operative state 10/10/2017   Symptomatic anemia 05/20/2017   Arthralgia of both knees 06/27/2015   BACK STRAIN, LUMBAR 03/16/2010   KNEE PAIN, BILATERAL 06/30/2009   OBESITY 04/13/2009   Edema 04/13/2009   FIBROADENOMA, BREAST 04/20/2008   HYPERTENSION, BENIGN ESSENTIAL 04/14/2008   Allergic rhinitis 04/14/2008   Asthma 04/14/2008    Conditions to be addressed/monitored per PCP order:  Depression  Care Plan : LCSW Plan of Care  Updates made by Gustavus Bryant, LCSW since 05/21/2022 12:00 AM     Problem: Depression Identification (Depression)      Long-Range Goal: Depressive Symptoms Identified   Start Date: 05/02/2022  Priority: High  Note:   Priority: High  Timeframe:  Long-Range Goal Priority:  High Start Date: 05/02/22              Expected End Date:  ongoing  Follow Up Date--06/04/22  - check out bereavement counseling and long term counseling options -resources provided for you to write down due to low usage of email - keep 90 percent of counseling appointments - schedule initial counseling appointment    Why is this important?             Beating grief and depression may take some time.            If you don't feel better right away, don't give up on your treatment plan.    Current barriers:   Chronic Mental Health needs related to depression and grief from losing her daughter in April of 2023. Patient requires Support, Education, Resources, Referrals, Advocacy, and Care Coordination, in order to meet Unmet Mental Health Needs. Patient will implement clinical interventions discussed today to decrease symptoms of grief and increase knowledge and/or ability of:  coping skills. Mental Health Concerns and Social Isolation Patient lacks knowledge of available community counseling agencies and resources.  Clinical Goal(s): verbalize understanding of plan for management of Grief, Depression, and Stress and demonstrate a reduction in symptoms. Patient will connect with a provider for ongoing mental health treatment, increase coping skills, healthy habits, self-management skills, and stress reduction        Clinical Interventions:  Assessed patient's previous and current treatment, coping skills, support system and barriers to care. Patient is experiencing grief due to the loss of a loved one but prefers long term therapy over brief grief counseling. Verbalization of feelings encouraged, motivational interviewing employed Emotional support provided, positive coping strategies explored Self care emphasized Patient is agreeable to referral to [facility] for individual counseling only. She does not want psychiatry. Patient reports significant worsening grief and depression impacting her ability to function appropriately and carry out daily task. LCSW provided education on relaxation techniques such as meditation, deep breathing, massage, grounding exercises or yoga that can activate the body's relaxation response and ease symptoms of stress and anxiety. LCSW ask that when pt is struggling with difficult emotions and racing thoughts that they start this relaxation response process. LCSW provided extensive education on healthy coping skills for anxiety. SW used active and reflective listening, validated patient's feelings/concerns, and provided emotional support. Patient will work on implementing appropriate self-care habits into their daily routine such as: staying positive, writing a gratitude list, drinking water, staying active around the house, taking their medications as prescribed, combating negative thoughts or emotions and staying connected with their family and  friends. Positive reinforcement provided for this decision to work on this. Motivational Interviewing employed Depression screen reviewed  PHQ2/ PHQ9 completed Mindfulness or Relaxation training provided Active listening / Reflection utilized  Advance Care and HCPOA education provided Emotional Support Provided Problem Solving /Task Ryan strategies reviewed Provided psychoeducation for mental health needs  Provided brief CBT  Reviewed mental health medications and discussed importance of compliance:  Quality of sleep assessed & Sleep Hygiene techniques promoted  Participation in counseling encouraged  Verbalization of feelings encouraged  Suicidal Ideation/Homicidal Ideation assessed: Patient denies SI/HI  Review resources, discussed options and provided patient information about  Mental Health Resources Inter-disciplinary care team collaboration (see longitudinal plan of care) UPDATE- Pt reports that she is still having issues with transportation and has planned to get that sorted out this week. She request a follow up appointment with [facility] LCSW 2 weeks from now so that she can set up her intake appointment once she knows her transportation is secure. [facility] LCSW provided brief coping skill education.  Patient Goals/Self-Care Activities: Over the next 120 days Attend scheduled medical appointments Utilize healthy coping skills and supportive resources discussed Contact PCP with any questions or concerns Keep 90 percent of counseling appointments Call your insurance provider for more information about your Enhanced Benefits  Check out counseling resources provided  Begin personal counseling with LCSW, to reduce and manage symptoms of Grief, Depression and Stress, until well-established with mental health provider Accept all calls from representative at [email or web address] as an effort to establish ongoing mental health counseling and supportive services.  Incorporate into daily practice - relaxation  techniques, deep breathing exercises, and mindfulness meditation strategies. Talk about feelings with friends, family members, spiritual advisor, etc. Contact LCSW directly [phone]), if you have questions, need assistance, or if additional social work needs are identified between now and our next scheduled telephone outreach call. Call 988 for mental health hotline/crisis line if needed (24/7 available) Try techniques to reduce symptoms of anxiety/negative thinking (deep breathing, distraction, positive self talk, etc)  - develop a personal safety plan - develop a plan to deal with triggers like holidays, anniversaries - exercise at least 2 to 3 times per week - have a plan for how to handle bad days - journal feelings and what helps to feel better or worse - spend time or talk with others at least 2 to 3 times per week - watch for early signs of feeling worse - begin personal counseling - call and visit an old friend - check out volunteer opportunities - join a support group - laugh; watch a funny movie or comedian - learn and use visualization or guided imagery - perform a random act of kindness - practice relaxation or meditation daily - start or continue a personal journal - practice positive thinking and self-talk -continue with compliance of taking medication  -identify current effective and ineffective coping strategies.  -implement positive self-talk in care to increase self-esteem, confidence and feelings of control.  -consider alternative and complementary therapy approaches such as meditation, mindfulness or yoga.  -journaling, prayer, worship services, meditation or pastoral counseling.  -increase participation in pleasurable group activities such as hobbies, singing, sports or volunteering).  -consider the use of meditative movement therapy such as tai chi, yoga or qigong.  -start a regular daily exercise program based on tolerance, ability and patient choice to  support positive thinking and activity     Managing Loss, Adult People experience loss in many different ways throughout their lives. Events such as moving, changing jobs, and losing friends can create a sense of loss. The loss may be as serious as a major health change, divorce, death of a pet, or death of a loved one. All of these types of loss are likely to create a physical and emotional reaction known as grief. Grief is the result of a major change or an absence of something or someone that you count on. Grief is a normal reaction to loss. A variety of factors can affect your grieving experience, including: The nature of your loss. Your relationship to what or whom you lost. Your understanding of grief and how to manage it. Your support system. How to manage lifestyle changes Keep to your normal routine as much as possible. If you have trouble focusing or doing normal activities, it is acceptable to take some time away from your normal routine. Spend time with friends and loved ones. Eat a healthy diet, get plenty of sleep, and rest when you feel tired. How to recognize changes  The  way that you deal with your grief will affect your ability to function as you normally do. When grieving, you may experience these changes: Numbness, shock, sadness, anxiety, anger, denial, and guilt. Thoughts about death. Unexpected crying. A physical sensation of emptiness in your stomach. Problems sleeping and eating. Tiredness (fatigue). Loss of interest in normal activities. Dreaming about or imagining seeing the person who died. A need to remember what or whom you lost. Difficulty thinking about anything other than your loss for a period of time. Relief. If you have been expecting the loss for a while, you may feel a sense of relief when it happens. Follow these instructions at home:    Activity Express your feelings in healthy ways, such as: Talking with others about your loss. It may be  helpful to find others who have had a similar loss, such as a support group. Writing down your feelings in a journal. Doing physical activities to release stress and emotional energy. Doing creative activities like painting, sculpting, or playing or listening to music. Practicing resilience. This is the ability to recover and adjust after facing challenges. Reading some resources that encourage resilience may help you to learn ways to practice those behaviors. General instructions Be patient with yourself and others. Allow the grieving process to happen, and remember that grieving takes time. It is likely that you may never feel completely done with some grief. You may find a way to move on while still cherishing memories and feelings about your loss. Accepting your loss is a process. It can take months or longer to adjust. Keep all follow-up visits as told by your health care provider. This is important. Where to find support To get support for managing loss: Ask your health care provider for help and recommendations, such as grief counseling or therapy. Think about joining a support group for people who are managing a loss. Where to find more information You can find more information about managing loss from: American Society of Clinical Oncology: www.cancer.net American Psychological Association: [employer] Contact a health care provider if: Your grief is extreme and keeps getting worse. You have ongoing grief that does not improve. Your body shows symptoms of grief, such as illness. You feel depressed, anxious, or lonely. Get help right away if: You have thoughts about hurting yourself or others. If you ever feel like you may hurt yourself or others, or have thoughts about taking your own life, get help right away. You can go to your nearest emergency department or call: Your local emergency services (911 in the [place] A suicide crisis helpline, such as the National Suicide Prevention  Lifeline at 930-091-7902. This is open 24 hours a day. Summary Grief is the result of a major change or an absence of someone or something that you count on. Grief is a normal reaction to loss. The depth of grief and the period of recovery depend on the type of loss and your ability to adjust to the change and process your feelings. Processing grief requires patience and a willingness to accept your feelings and talk about your loss with people who are supportive. It is important to find resources that work for you and to realize that people experience grief differently. There is not one grieving process that works for everyone in the same way. Be aware that when grief becomes extreme, it can lead to more severe issues like isolation, depression, anxiety, or suicidal thoughts. Talk with your health care provider if you have any of these  issues. This information is not intended to replace advice given to you by your health care provider. Make sure you discuss any questions you have with your health care provider. Document Revised: 10/10/2018 Document Reviewed: 12/20/2016 Elsevier Patient Education  2020 [employer].  Help for Managing Grief   When you are experiencing grief, many resources and support are available to help you. You are not alone.    Grief Share (https://www.SunglassSpecialist.gl):    [employer] of [address] [place], [place]      [facility]  [phone] [address] [place], [place]     [facility] [address] [place], [place]      [facility] [address] [place], [place]  [phone]   [facility] [address] [place], [place] [phone]   [facility] [address] [place], [place]     [phone]   [facility] [address] [place], [place]     [phone]   If a Hospice or Palliative Care team has been involved in the care of your loved  one, you may reach out to your contact there or locally, you may reach out to [facility]:    [facility]   [address] [place], [place] [address] [phone]   [facility] in [place], [place]  [address] [place], [place], [place] [address] Phone: [phone]  If you are experiencing a Mental Health or Behavioral Health Crisis or need someone to talk to, please call the Suicide and Crisis Lifeline: 988        Follow up:  Patient agrees to Care Plan and Follow-up.  Plan: The Managed Medicaid care management team will reach out to the patient again over the next 30 days.  Date/time of next scheduled Social Work care management/care coordination outreach:  06/04/22 at 1045  Dickie La, BSW, MSW, [employer] Managed Medicaid LCSW [facility]  [facility] [email or web address] Phone: [phone]

## 2022-05-21 NOTE — Patient Instructions (Signed)
Visit Information  Ms. Krutz was given information about Medicaid Managed Care team care coordination services as a part of their [place] Medicaid benefit. Bryttani Blew Hainsworth verbally consented to engagement with the [facility] Managed Care team.   If you are experiencing a medical emergency, please call 911 or report to your local emergency department or urgent care.   If you have a non-emergency medical problem during routine business hours, please contact your provider's office and ask to speak with a nurse.   For questions related to your [facility], please call: [phone] or visit the homepage here: [email or web address]  If you would like to schedule transportation through your [facility], please call the following number at least 2 days in advance of your appointment: [phone]   Rides for urgent appointments can also be made after hours by calling Member Services.  Call the [place] at [phone], at any time, 24 hours a day, 7 days a week. If you are in danger or need immediate medical attention call 911.  If you would like help to quit smoking, call 1-800-QUIT-NOW 501-233-2327) OR Espaol: 1-855-Djelo-Ya (0-076-226-3335) o para ms informacin haga clic aqu or Text READY to 200-400 to register via text  Following is a copy of your plan of care:  Care Plan : LCSW Plan of Care  Updates made by Greg Cutter, LCSW since 05/21/2022 12:00 AM     Problem: Depression Identification (Depression)      Long-Range Goal: Depressive Symptoms Identified   Start Date: 05/02/2022  Priority: High  Note:   Priority: High  Timeframe:  Long-Range Goal Priority:  High Start Date: 05/02/22              Expected End Date:  ongoing                     Follow Up Date--06/04/22  - check out bereavement counseling and long term  counseling options -resources provided for you to write down due to low usage of email - keep 90 percent of counseling appointments - schedule initial counseling appointment    Why is this important?             Beating grief and depression may take some time.            If you don't feel better right away, don't give up on your treatment plan.    Current barriers:   Chronic Mental Health needs related to depression and grief from losing her daughter in April of 2023. Patient requires Support, Education, Resources, Referrals, Advocacy, and Care Coordination, in order to meet Unmet Mental Health Needs. Patient will implement clinical interventions discussed today to decrease symptoms of grief and increase knowledge and/or ability of: coping skills. Mental Health Concerns and Social Isolation Patient lacks knowledge of available community counseling agencies and resources.  Clinical Goal(s): verbalize understanding of plan for management of Grief, Depression, and Stress and demonstrate a reduction in symptoms. Patient will connect with a provider for ongoing mental health treatment, increase coping skills, healthy habits, self-management skills, and stress reduction        Patient Goals/Self-Care Activities: Over the next 120 days Attend scheduled medical appointments Utilize healthy coping skills and supportive resources discussed Contact PCP with any questions or concerns Keep 90 percent of counseling appointments Call your insurance provider for more information about your Enhanced Benefits  Check out counseling resources provided  Begin  personal counseling with LCSW, to reduce and manage symptoms of Grief, Depression and Stress, until well-established with mental health provider Accept all calls from representative at [facility] as an effort to establish ongoing mental health counseling and supportive services.  Incorporate into daily practice - relaxation techniques, deep breathing exercises,  and mindfulness meditation strategies. Talk about feelings with friends, family members, spiritual advisor, etc. Contact LCSW directly [phone]), if you have questions, need assistance, or if additional social work needs are identified between now and our next scheduled telephone outreach call. Call 988 for mental health hotline/crisis line if needed (24/7 available) Try techniques to reduce symptoms of anxiety/negative thinking (deep breathing, distraction, positive self talk, etc)  - develop a personal safety plan - develop a plan to deal with triggers like holidays, anniversaries - exercise at least 2 to 3 times per week - have a plan for how to handle bad days - journal feelings and what helps to feel better or worse - spend time or talk with others at least 2 to 3 times per week - watch for early signs of feeling worse - begin personal counseling - call and visit an old friend - check out volunteer opportunities - join a support group - laugh; watch a funny movie or comedian - learn and use visualization or guided imagery - perform a random act of kindness - practice relaxation or meditation daily - start or continue a personal journal - practice positive thinking and self-talk -continue with compliance of taking medication  -identify current effective and ineffective coping strategies.  -implement positive self-talk in care to increase self-esteem, confidence and feelings of control.  -consider alternative and complementary therapy approaches such as meditation, mindfulness or yoga.  -journaling, prayer, worship services, meditation or pastoral counseling.  -increase participation in pleasurable group activities such as hobbies, singing, sports or volunteering).  -consider the use of meditative movement therapy such as tai chi, yoga or qigong.  -start a regular daily exercise program based on tolerance, ability and patient choice to support positive thinking and activity      Managing Loss, Adult People experience loss in many different ways throughout their lives. Events such as moving, changing jobs, and losing friends can create a sense of loss. The loss may be as serious as a major health change, divorce, death of a pet, or death of a loved one. All of these types of loss are likely to create a physical and emotional reaction known as grief. Grief is the result of a major change or an absence of something or someone that you count on. Grief is a normal reaction to loss. A variety of factors can affect your grieving experience, including: The nature of your loss. Your relationship to what or whom you lost. Your understanding of grief and how to manage it. Your support system. How to manage lifestyle changes Keep to your normal routine as much as possible. If you have trouble focusing or doing normal activities, it is acceptable to take some time away from your normal routine. Spend time with friends and loved ones. Eat a healthy diet, get plenty of sleep, and rest when you feel tired. How to recognize changes  The way that you deal with your grief will affect your ability to function as you normally do. When grieving, you may experience these changes: Numbness, shock, sadness, anxiety, anger, denial, and guilt. Thoughts about death. Unexpected crying. A physical sensation of emptiness in your stomach. Problems sleeping and eating. Tiredness (fatigue). Loss  of interest in normal activities. Dreaming about or imagining seeing the person who died. A need to remember what or whom you lost. Difficulty thinking about anything other than your loss for a period of time. Relief. If you have been expecting the loss for a while, you may feel a sense of relief when it happens. Follow these instructions at home:    Activity Express your feelings in healthy ways, such as: Talking with others about your loss. It may be helpful to find others who have had a similar  loss, such as a support group. Writing down your feelings in a journal. Doing physical activities to release stress and emotional energy. Doing creative activities like painting, sculpting, or playing or listening to music. Practicing resilience. This is the ability to recover and adjust after facing challenges. Reading some resources that encourage resilience may help you to learn ways to practice those behaviors. General instructions Be patient with yourself and others. Allow the grieving process to happen, and remember that grieving takes time. It is likely that you may never feel completely done with some grief. You may find a way to move on while still cherishing memories and feelings about your loss. Accepting your loss is a process. It can take months or longer to adjust. Keep all follow-up visits as told by your health care provider. This is important. Where to find support To get support for managing loss: Ask your health care provider for help and recommendations, such as grief counseling or therapy. Think about joining a support group for people who are managing a loss. Where to find more information You can find more information about managing loss from: American Society of Clinical Oncology: www.cancer.net American Psychological Association: TVStereos.ch Contact a health care provider if: Your grief is extreme and keeps getting worse. You have ongoing grief that does not improve. Your body shows symptoms of grief, such as illness. You feel depressed, anxious, or lonely. Get help right away if: You have thoughts about hurting yourself or others. If you ever feel like you may hurt yourself or others, or have thoughts about taking your own life, get help right away. You can go to your nearest emergency department or call: Your local emergency services (911 in the [place] A suicide crisis helpline, such as the [place] at 628-585-3440. This is open 24  hours a day. Summary Grief is the result of a major change or an absence of someone or something that you count on. Grief is a normal reaction to loss. The depth of grief and the period of recovery depend on the type of loss and your ability to adjust to the change and process your feelings. Processing grief requires patience and a willingness to accept your feelings and talk about your loss with people who are supportive. It is important to find resources that work for you and to realize that people experience grief differently. There is not one grieving process that works for everyone in the same way. Be aware that when grief becomes extreme, it can lead to more severe issues like isolation, depression, anxiety, or suicidal thoughts. Talk with your health care provider if you have any of these issues. This information is not intended to replace advice given to you by your health care provider. Make sure you discuss any questions you have with your health care provider. Document Revised: 10/10/2018 Document Reviewed: 12/20/2016 Elsevier Patient Education  [place] for Managing Grief   When you  are experiencing grief, many resources and support are available to help you. You are not alone.    Grief Share (https://www.https://jacobson-moore.net/):    [employer] [facility] [place], [place]  [phone] [address], [place], [place], [place]   [place], [place]   [facility] [address] [place], [place]   [place], [place]   If a Hospice or [place] team has been involved in the care of your loved one, you may reach out to your contact there or  locally, you may reach out to [place]:    [facility] and [facility]   [place], [place] [address] [place] in [place], [place]  [place], [place], [place] [address] Phone: [phone]  If you are experiencing a Mental Health or [place] or need someone to talk to, please call the Suicide and Crisis Lifeline: 61

## 2022-05-22 DIAGNOSIS — F321 Major depressive disorder, single episode, moderate: Secondary | ICD-10-CM | POA: Diagnosis not present

## 2022-05-23 DIAGNOSIS — F321 Major depressive disorder, single episode, moderate: Secondary | ICD-10-CM | POA: Diagnosis not present

## 2022-05-24 ENCOUNTER — Other Ambulatory Visit: Payer: Self-pay

## 2022-05-24 DIAGNOSIS — F321 Major depressive disorder, single episode, moderate: Secondary | ICD-10-CM | POA: Diagnosis not present

## 2022-05-24 DIAGNOSIS — G4733 Obstructive sleep apnea (adult) (pediatric): Secondary | ICD-10-CM | POA: Diagnosis not present

## 2022-05-24 NOTE — Patient Outreach (Signed)
Medicaid Managed Care Social Work Note  05/24/2022 Name:  Caroline Ryan MRN:  [id number] DOB:  01/14/1966  Caroline Ryan is an 56 y.o. year old female who is a primary patient of Caroline Foy, Ryan.  The Medicaid Managed Care Coordination team was consulted for assistance with:  Community Resources   Caroline Ryan was given information about Medicaid Managed Care Coordination team services today. Caroline Ryan Patient agreed to services and verbal consent obtained.  Engaged with patient  for by telephone forfollow up visit in response to referral for case management and/or care coordination services.   Assessments/Interventions:  Review of past medical history, allergies, medications, health status, including review of consultants reports, laboratory and other test data, was performed as part of comprehensive evaluation and provision of chronic care management services.  SDOH: (Social Determinant of Health) assessments and interventions performed: SDOH Interventions    Flowsheet Row Patient Outreach Telephone from 05/21/2022 in [place] Patient Outreach Telephone from 05/02/2022 in [place] Patient Outreach Telephone from 03/09/2022 in [facility] [employer] Office Visit from 08/09/2020 in [facility] [employer] [facility]  SDOH Interventions      Food Insecurity Interventions -- -- Other (Comment)  [BSW referral for assistance with transportation to food pantries and non medical errands] Information systems manager ([facility] only)  Housing Interventions -- -- Intervention Not Indicated --  Transportation Interventions -- -- Other (Comment)  [UHC transportion [id number]] [employer]  Depression Interventions/Treatment  Counseling Counseling -- --  Stress Interventions Provide Counseling, [place] Resources, Provide Counseling -- --     BSW completed a telephone outreach with patient. She stated she is currently in [place] with her friend that just lost her son yesterday. She does not know when she will be back. BSW will continue to follow up with patient to provide any resources once she returns.   Advanced Directives Status:  Not addressed in this encounter.  Care Plan                 No Known Allergies  Medications Reviewed Today     Reviewed by Caroline Foy, Ryan (Nurse Practitioner) on 05/16/22 at [phone]  Med List Status: <None>   Medication Order Taking? Sig Documenting Provider Last Dose Status Informant  acetaminophen (TYLENOL) 500 MG tablet [id number] No Take 2 tablets (1,000 mg total) by mouth 2 (two) times daily as needed.  Patient not taking: Reported on 05/14/2022   Caroline Ryan Not Taking Active   albuterol (VENTOLIN HFA) 108 (90 Base) MCG/ACT inhaler [id number] Yes Inhale 1 puff every 6 hours as needed for shortness of breath Caroline Ryan Taking Active   aspirin 81 MG chewable tablet [id number] Yes Chew by mouth daily. Provider, Historical, Caroline Ryan Taking Active   carvedilol (COREG) 25 MG tablet [id number] Yes TAKE 1 TABLET (25 MG TOTAL) BY MOUTH 2 (TWO) TIMES DAILY. Caroline Margarita, Caroline Ryan Taking Active   cetirizine (ZYRTEC) 10 MG tablet [id number] Yes Take 1 tablet (10 mg total) by mouth daily. Caroline Ryan Taking Active   cloNIDine (CATAPRES - DOSED IN MG/24 HR) 0.1 mg/24hr patch [id number] Yes Place 1 patch (0.1 mg total) onto the skin once a week. Caroline Margarita, Caroline Ryan Taking Active   cyclobenzaprine (FLEXERIL) 5 MG tablet [id number] Yes Take 1 tablet (5 mg total) by mouth  3 (three) times daily as needed for muscle spasms. Caroline Foy, Ryan Taking Active   diclofenac Sodium (VOLTAREN) 1 % GEL [id number] Yes Apply 4 g topically 4 (four) times daily. Caroline Ryan Taking Active   docusate sodium (COLACE) 100 MG  capsule [id number]  Take 1 capsule (100 mg total) by mouth 2 (two) times daily as needed. Caroline Ryan  Active   Efinaconazole 10 % SOLN [id number] Yes Apply 1 Application topically daily. Caroline Foy, Ryan  Active   furosemide (LASIX) 20 MG tablet [id number]  Take 1 tablet (20 mg total) by mouth daily. Caroline Ribas, Caroline Ryan  Expired 04/24/22 2359   hydrocortisone 2.5 % cream [id number] Yes Apply topically 2 (two) times daily. Caroline Ryan Taking Active   hydrOXYzine (ATARAX) 25 MG tablet [id number] Yes Take 1 tablet (25 mg total) by mouth 3 (three) times daily as needed. Caroline Ryan Taking Active   ibuprofen (ADVIL) 800 MG tablet [id number] Yes Take 1 tablet (800 mg total) by mouth 3 (three) times daily. Caroline Ribas, Caroline Ryan Taking Active   linaclotide Rolan Lipa) 72 MCG capsule [id number] Yes Take 1 capsule (72 mcg total) by mouth daily before breakfast. Caroline Ryan Taking Active   methocarbamol (ROBAXIN) 750 MG tablet [id number] Yes Take 1 tablet (750 mg total) by mouth 3 (three) times daily. Caroline Foy, Ryan  Active   montelukast (SINGULAIR) 10 MG tablet [id number]  TAKE 1 TABLET (10 MG TOTAL) BY MOUTH AT BEDTIME. Caroline Ryan  Expired 04/24/22 2359   NONFORMULARY OR COMPOUNDED ITEM [id number] No Diclofenac 3%, Gabapentin 5%, Lidocaine 5%, Menthol 1%.  Apply 1-2 pumps three to four times per day as needed  Patient not taking: Reported on 03/23/2022   Caroline Ribas, Caroline Ryan Not Taking Active   olmesartan (BENICAR) 40 MG tablet [id number] Yes TAKE 1 TABLET (40 MG TOTAL) BY MOUTH DAILY. Caroline Margarita, Caroline Ryan Taking Active   spironolactone (ALDACTONE) 100 MG tablet [id number] Yes TAKE 1 TABLET (100 MG TOTAL) BY MOUTH DAILY. Caroline Margarita, Caroline Ryan Taking Active   Triamcinolone Acetonide Orvan Seen) intra-articular injection 32 mg [id number]   Caroline Ribas, Caroline Ryan  Active   Triamcinolone Acetonide (ZILRETTA) intra-articular injection 32 mg [id number]   Caroline Ribas, Caroline Ryan  Active             Patient Active Problem List   Diagnosis Date Noted   Chronic pain of both knees 05/16/2022   OSA (obstructive sleep apnea) 11/09/2021   Mitral regurgitation 09/07/2021   Dilated aortic root ([place]) 09/07/2021   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/2020   Unilateral primary osteoarthritis, left knee 10/22/2019   Unilateral primary osteoarthritis, right knee 10/22/2019   BMI 50.0-59.9, adult ([place]) 10/22/2019   Grieving 06/10/2019   Pruritus 06/10/2019   Lower extremity edema 11/02/2018   Post-operative state 10/10/2017   Symptomatic anemia 05/20/2017   Arthralgia of both knees 06/27/2015   BACK STRAIN, LUMBAR 03/16/2010   KNEE PAIN, BILATERAL 06/30/2009   OBESITY 04/13/2009   Edema 04/13/2009   FIBROADENOMA, BREAST 04/20/2008   HYPERTENSION, BENIGN ESSENTIAL 04/14/2008   Allergic rhinitis 04/14/2008   Asthma 04/14/2008    Conditions to be addressed/monitored per PCP order:   community resources  There are no care plans that you recently modified to display for this patient.   Follow up:  Patient agrees to Care Plan and Follow-up.  Plan: The Managed Medicaid care management team will reach out  to the patient again over the next 30 days.  Date/time of next scheduled Social Work care management/care coordination outreach:  06/25/22  Mickel Fuchs, Arita Miss, [place] Medicaid Team  [phone]

## 2022-05-24 NOTE — Patient Instructions (Signed)
Visit Information  Caroline Ryan was given information about Medicaid Managed Care team care coordination services as a part of their [place] Medicaid benefit. [name] verbally consented to engagement with the [facility] Managed Care team.   If you are experiencing a medical emergency, please call 911 or report to your local emergency department or urgent care.   If you have a non-emergency medical problem during routine business hours, please contact your provider's office and ask to speak with a nurse.   For questions related to your [facility], please call: [phone] or visit the homepage here: [email or web address]  If you would like to schedule transportation through your [facility], please call the following number at least 2 days in advance of your appointment: [phone]   Rides for urgent appointments can also be made after hours by calling Member Services.  Call the [place] at [phone], at any time, 24 hours a day, 7 days a week. If you are in danger or need immediate medical attention call 911.  If you would like help to quit smoking, call 1-800-QUIT-NOW 8075008245) OR Espaol: 1-855-Djelo-Ya (6-301-601-0932) o para ms informacin haga clic aqu or Text READY to 200-400 to register via text  Ms. Masoud - following are the goals we discussed in your visit today:   Goals Addressed   None       Social Worker will follow up in 30 days .   Mickel Fuchs, BSW, [place] Managed Medicaid Team  [phone]   Following is a copy of your plan of care:  There are no care plans that you recently modified to display for this patient.

## 2022-05-25 DIAGNOSIS — F321 Major depressive disorder, single episode, moderate: Secondary | ICD-10-CM | POA: Diagnosis not present

## 2022-05-26 DIAGNOSIS — F321 Major depressive disorder, single episode, moderate: Secondary | ICD-10-CM | POA: Diagnosis not present

## 2022-05-28 ENCOUNTER — Other Ambulatory Visit: Payer: Self-pay | Admitting: *Deleted

## 2022-05-28 DIAGNOSIS — F321 Major depressive disorder, single episode, moderate: Secondary | ICD-10-CM | POA: Diagnosis not present

## 2022-05-28 NOTE — Patient Outreach (Signed)
  Medicaid Managed Care   Unsuccessful Attempt Note   05/28/2022 Name: Caroline Ryan MRN: [id number] DOB: 1966-06-14  Referred by: Fenton Foy, NP Reason for referral : High Risk Managed Medicaid (Unsuccessful RNCM follow up telephone outreach)   An unsuccessful telephone outreach was attempted today. The patient was referred to the case management team for assistance with care management and care coordination.    Follow Up Plan: A HIPAA compliant phone message was left for the patient providing contact information and requesting a return call.    Lurena Joiner RN, BSN [place]  [employer] manager

## 2022-05-28 NOTE — Patient Instructions (Signed)
Visit Information  Caroline Ryan [facility]  - as a part of your Medicaid benefit, you are eligible for care management and care coordination services at no cost or copay. I was unable to reach you by phone today but would be happy to help you with your health related needs. Please feel free to call me @ [phone].   A member of the Managed Medicaid care management team will reach out to you again over the next 14 days.   Lurena Joiner RN, BSN [place]  [employer] manager

## 2022-05-29 DIAGNOSIS — F321 Major depressive disorder, single episode, moderate: Secondary | ICD-10-CM | POA: Diagnosis not present

## 2022-05-30 DIAGNOSIS — F321 Major depressive disorder, single episode, moderate: Secondary | ICD-10-CM | POA: Diagnosis not present

## 2022-05-31 DIAGNOSIS — F321 Major depressive disorder, single episode, moderate: Secondary | ICD-10-CM | POA: Diagnosis not present

## 2022-06-01 DIAGNOSIS — F321 Major depressive disorder, single episode, moderate: Secondary | ICD-10-CM | POA: Diagnosis not present

## 2022-06-02 DIAGNOSIS — F321 Major depressive disorder, single episode, moderate: Secondary | ICD-10-CM | POA: Diagnosis not present

## 2022-06-04 ENCOUNTER — Other Ambulatory Visit: Payer: Self-pay | Admitting: Licensed Clinical Social Worker

## 2022-06-04 DIAGNOSIS — F321 Major depressive disorder, single episode, moderate: Secondary | ICD-10-CM | POA: Diagnosis not present

## 2022-06-04 NOTE — Patient Outreach (Signed)
Medicaid Managed Care Social Work Note  06/04/2022 Name:  Caroline Ryan MRN:  [id number] DOB:  13-Apr-1966  Caroline Ryan is an 56 y.o. year old female who is a primary patient of Ivonne Andrew, NP.  The Medicaid Managed Care Coordination team was consulted for assistance with:  Mental Health Counseling and Resources  Ms. Seidenberg was given information about Medicaid Managed Care Coordination team services today. Caroline Ryan Patient agreed to services and verbal consent obtained.  Engaged with patient  for by telephone forfollow up visit in response to referral for case management and/or care coordination services.   Assessments/Interventions:  Review of past medical history, allergies, medications, health status, including review of consultants reports, laboratory and other test data, was performed as part of comprehensive evaluation and provision of chronic care management services.  SDOH: (Social Determinant of Health) assessments and interventions performed: SDOH Interventions    Flowsheet Row Patient Outreach Telephone from 05/21/2022 in [facility] Care Coordination Patient Outreach Telephone from 05/02/2022 in [facility] [facility] Care Coordination Patient Outreach Telephone from 03/09/2022 in [facility] [employer] [employer] Office Visit from 08/09/2020 in [facility] [employer] [facility] [employer] [facility]  SDOH Interventions      Food Insecurity Interventions -- -- Other (Comment)  [BSW referral for assistance with transportation to food pantries and non medical errands] 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  Housing Interventions -- -- Intervention Not Indicated --  Transportation Interventions -- -- Other (Comment)  [UHC transportion [phone]] [employer]  Depression Interventions/Treatment  Counseling Counseling -- --  Stress Interventions Provide Counseling, Offered  [employer] Resources Offered [employer], Provide Counseling -- --       Advanced Directives Status:  See Care Plan for related entries.  Care Plan                 No Known Allergies  Medications Reviewed Today     Reviewed by Gustavus Bryant, LCSW (Social Worker) on 06/04/22 at 1427  Med List Status: <None>   Medication Order Taking? Sig Documenting Provider Last Dose Status Informant  acetaminophen (TYLENOL) 500 MG tablet [id number] No Take 2 tablets (1,000 mg total) by mouth 2 (two) times daily as needed.  Patient not taking: Reported on 05/14/2022   [name], FNP Not Taking Active   albuterol (VENTOLIN HFA) 108 (90 Base) MCG/ACT inhaler [id number] No Inhale 1 puff every 6 hours as needed for shortness of breath [name], NP Taking Active   aspirin 81 MG chewable tablet [id number] No Chew by mouth daily. [provider] Taking Active   carvedilol (COREG) 25 MG tablet [id number] No TAKE 1 TABLET (25 MG TOTAL) BY MOUTH 2 (TWO) TIMES DAILY. [name], MD Taking Active   cetirizine (ZYRTEC) 10 MG tablet [id number] No Take 1 tablet (10 mg total) by mouth daily. [name], NP Taking Active   cloNIDine (CATAPRES - DOSED IN MG/24 HR) 0.1 mg/24hr patch [id number] No Place 1 patch (0.1 mg total) onto the skin once a week. [name], MD Taking Active   cyclobenzaprine (FLEXERIL) 5 MG tablet [id number] No Take 1 tablet (5 mg total) by mouth 3 (three) times daily as needed for muscle spasms. Ivonne Andrew, NP Taking Active   diclofenac Sodium (VOLTAREN) 1 % GEL [id number] No Apply 4 g topically 4 (four) times daily. [name], NP Taking Active   docusate sodium (COLACE)  100 MG capsule [id number] No Take 1 capsule (100 mg total) by mouth 2 (two) times daily as needed. [name], NP Taking Active   Efinaconazole 10 % SOLN [id number]  Apply 1 Application topically daily. Ivonne Andrew, NP  Active   furosemide  (LASIX) 20 MG tablet [id number] No Take 1 tablet (20 mg total) by mouth daily. [name], MD Taking Expired 04/24/22 2359   hydrocortisone 2.5 % cream [id number] No Apply topically 2 (two) times daily. [name], NP Taking Active   hydrOXYzine (ATARAX) 25 MG tablet [id number] No Take 1 tablet (25 mg total) by mouth 3 (three) times daily as needed. [name], NP Taking Active   ibuprofen (ADVIL) 800 MG tablet [id number] No Take 1 tablet (800 mg total) by mouth 3 (three) times daily. [name], MD Taking Active   linaclotide [name]) 72 MCG capsule [id number] No Take 1 capsule (72 mcg total) by mouth daily before breakfast. [name], NP Taking Active   methocarbamol (ROBAXIN) 750 MG tablet [id number]  Take 1 tablet (750 mg total) by mouth 3 (three) times daily. Ivonne Andrew, NP  Active   montelukast (SINGULAIR) 10 MG tablet [id number] No TAKE 1 TABLET (10 MG TOTAL) BY MOUTH AT BEDTIME. [name], FNP Taking Expired 04/24/22 2359   NONFORMULARY OR COMPOUNDED ITEM [id number] No Diclofenac 3%, Gabapentin 5%, Lidocaine 5%, Menthol 1%.  Apply 1-2 pumps three to four times per day as needed  Patient not taking: Reported on 03/23/2022   [name], MD Not Taking Active   olmesartan (BENICAR) 40 MG tablet [id number] No TAKE 1 TABLET (40 MG TOTAL) BY MOUTH DAILY. [name], MD Taking Active   spironolactone (ALDACTONE) 100 MG tablet [id number] No TAKE 1 TABLET (100 MG TOTAL) BY MOUTH DAILY. [name], MD Taking Active   Triamcinolone Acetonide [facility]) intra-articular injection 32 mg [id number]   [name], MD  Active   Triamcinolone Acetonide (ZILRETTA) intra-articular injection 32 mg [id number]   [name], MD  Active             Patient Active Problem List   Diagnosis Date Noted   Chronic pain of both knees 05/16/2022   OSA (obstructive sleep apnea) 11/09/2021   Mitral regurgitation 09/07/2021   Dilated aortic root  (HCC) 09/07/2021   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/2020   Unilateral primary osteoarthritis, left knee 10/22/2019   Unilateral primary osteoarthritis, right knee 10/22/2019   BMI 50.0-59.9, adult (HCC) 10/22/2019   Grieving 06/10/2019   Pruritus 06/10/2019   Lower extremity edema 11/02/2018   Post-operative state 10/10/2017   Symptomatic anemia 05/20/2017   Arthralgia of both knees 06/27/2015   BACK STRAIN, LUMBAR 03/16/2010   KNEE PAIN, BILATERAL 06/30/2009   OBESITY 04/13/2009   Edema 04/13/2009   FIBROADENOMA, BREAST 04/20/2008   HYPERTENSION, BENIGN ESSENTIAL 04/14/2008   Allergic rhinitis 04/14/2008   Asthma 04/14/2008    Conditions to be addressed/monitored per PCP order:  Depression  Care Plan : LCSW Plan of Care  Updates made by Gustavus Bryant, LCSW since 06/04/2022 12:00 AM     Problem: Depression Identification (Depression)      Long-Range Goal: Depressive Symptoms Identified   Start Date: 05/02/2022  Priority: High  Note:   Priority: High  Timeframe:  Long-Range Goal Priority:  High Start Date: 05/02/22              Expected End Date:  ongoing  Follow Up Date--07/02/22  - check out bereavement counseling and long term counseling options -resources provided for you to write down due to low usage of email - keep 90 percent of counseling appointments - schedule initial counseling appointment    Why is this important?             Beating grief and depression may take some time.            If you don't feel better right away, don't give up on your treatment plan.    Current barriers:   Chronic Mental Health needs related to depression and grief from losing her daughter in April of 2023. Patient requires Support, Education, Resources, Referrals, Advocacy, and Care Coordination, in order to meet Unmet Mental Health Needs. Patient will implement clinical interventions discussed today to decrease symptoms of grief and increase knowledge and/or  ability of: coping skills. Mental Health Concerns and Social Isolation Patient lacks knowledge of available community counseling agencies and resources.  Clinical Goal(s): verbalize understanding of plan for management of Grief, Depression, and Stress and demonstrate a reduction in symptoms. Patient will connect with a provider for ongoing mental health treatment, increase coping skills, healthy habits, self-management skills, and stress reduction        Clinical Interventions:  Assessed patient's previous and current treatment, coping skills, support system and barriers to care. Patient is experiencing grief due to the loss of a loved one but prefers long term therapy over brief grief counseling. Verbalization of feelings encouraged, motivational interviewing employed Emotional support provided, positive coping strategies explored Self care emphasized Patient is agreeable to referral to [facility] for individual counseling only. She does not want psychiatry. Patient reports significant worsening grief and depression impacting her ability to function appropriately and carry out daily task. LCSW provided education on relaxation techniques such as meditation, deep breathing, massage, grounding exercises or yoga that can activate the body's relaxation response and ease symptoms of stress and anxiety. LCSW ask that when pt is struggling with difficult emotions and racing thoughts that they start this relaxation response process. LCSW provided extensive education on healthy coping skills for anxiety. SW used active and reflective listening, validated patient's feelings/concerns, and provided emotional support. Patient will work on implementing appropriate self-care habits into their daily routine such as: staying positive, writing a gratitude list, drinking water, staying active around the house, taking their medications as prescribed, combating negative thoughts or emotions and staying connected with their  family and friends. Positive reinforcement provided for this decision to work on this. Motivational Interviewing employed Depression screen reviewed  PHQ2/ PHQ9 completed Mindfulness or Relaxation training provided Active listening / Reflection utilized  Advance Care and HCPOA education provided Emotional Support Provided Problem Solving /Task Center strategies reviewed Provided psychoeducation for mental health needs  Provided brief CBT  Reviewed mental health medications and discussed importance of compliance:  Quality of sleep assessed & Sleep Hygiene techniques promoted  Participation in counseling encouraged  Verbalization of feelings encouraged  Suicidal Ideation/Homicidal Ideation assessed: Patient denies SI/HI  Review resources, discussed options and provided patient information about  Mental Health Resources Inter-disciplinary care team collaboration (see longitudinal plan of care) UPDATE- Pt reports that she is still having issues with transportation and has planned to get that sorted out this week. She request a follow up appointment with [facility] LCSW 2 weeks from now so that she can set up her intake appointment once she knows her transportation is secure. [facility] LCSW provided brief coping skill education.  UPDATE- [facility] LCSW was informed that patient had a family emergency and she is currently in [place] unable to get to her intake appointment at [facility] this week. Patient was provided crisis support resources and [facility] contact information again. Patient is agreeable to contact agency and [facility] LCSW sent in basket message to [facility] assistant. [facility] LCSW will follow up again next month to ensure that patient's needs were addressed.  Patient Goals/Self-Care Activities: Over the next 120 days Attend scheduled medical appointments Utilize healthy coping skills and supportive resources discussed Contact PCP with any questions or concerns Keep 90 percent of counseling appointments Call your insurance  provider for more information about your Enhanced Benefits  Check out counseling resources provided  Begin personal counseling with LCSW, to reduce and manage symptoms of Grief, Depression and Stress, until well-established with mental health provider Accept all calls from representative at [email or web address] as an effort to establish ongoing mental health counseling and supportive services.  Incorporate into daily practice - relaxation techniques, deep breathing exercises, and mindfulness meditation strategies. Talk about feelings with friends, family members, spiritual advisor, etc. Contact LCSW directly [phone]), if you have questions, need assistance, or if additional social work needs are identified between now and our next scheduled telephone outreach call. Call 988 for mental health hotline/crisis line if needed (24/7 available) Try techniques to reduce symptoms of anxiety/negative thinking (deep breathing, distraction, positive self talk, etc)  - develop a personal safety plan - develop a plan to deal with triggers like holidays, anniversaries - exercise at least 2 to 3 times per week - have a plan for how to handle bad days - journal feelings and what helps to feel better or worse - spend time or talk with others at least 2 to 3 times per week - watch for early signs of feeling worse - begin personal counseling - call and visit an old friend - check out volunteer opportunities - join a support group - laugh; watch a funny movie or comedian - learn and use visualization or guided imagery - perform a random act of kindness - practice relaxation or meditation daily - start or continue a personal journal - practice positive thinking and self-talk -continue with compliance of taking medication  -identify current effective and ineffective coping strategies.  -implement positive self-talk in care to increase self-esteem, confidence and feelings of control.  -consider alternative and  complementary therapy approaches such as meditation, mindfulness or yoga.  -journaling, prayer, worship services, meditation or pastoral counseling.  -increase participation in pleasurable group activities such as hobbies, singing, sports or volunteering).  -consider the use of meditative movement therapy such as tai chi, yoga or qigong.  -start a regular daily exercise program based on tolerance, ability and patient choice to support positive thinking and activity     Managing Loss, Adult People experience loss in many different ways throughout their lives. Events such as moving, changing jobs, and losing friends can create a sense of loss. The loss may be as serious as a major health change, divorce, death of a pet, or death of a loved one. All of these types of loss are likely to create a physical and emotional reaction known as grief. Grief is the result of a major change or an absence of something or someone that you count on. Grief is a normal reaction to loss. A variety of factors can affect your grieving experience, including: The nature of your loss. Your relationship to what or whom you lost. Your understanding of  grief and how to manage it. Your support system. How to manage lifestyle changes Keep to your normal routine as much as possible. If you have trouble focusing or doing normal activities, it is acceptable to take some time away from your normal routine. Spend time with friends and loved ones. Eat a healthy diet, get plenty of sleep, and rest when you feel tired. How to recognize changes  The way that you deal with your grief will affect your ability to function as you normally do. When grieving, you may experience these changes: Numbness, shock, sadness, anxiety, anger, denial, and guilt. Thoughts about death. Unexpected crying. A physical sensation of emptiness in your stomach. Problems sleeping and eating. Tiredness (fatigue). Loss of interest in normal  activities. Dreaming about or imagining seeing the person who died. A need to remember what or whom you lost. Difficulty thinking about anything other than your loss for a period of time. Relief. If you have been expecting the loss for a while, you may feel a sense of relief when it happens. Follow these instructions at home:    Activity Express your feelings in healthy ways, such as: Talking with others about your loss. It may be helpful to find others who have had a similar loss, such as a support group. Writing down your feelings in a journal. Doing physical activities to release stress and emotional energy. Doing creative activities like painting, sculpting, or playing or listening to music. Practicing resilience. This is the ability to recover and adjust after facing challenges. Reading some resources that encourage resilience may help you to learn ways to practice those behaviors. General instructions Be patient with yourself and others. Allow the grieving process to happen, and remember that grieving takes time. It is likely that you may never feel completely done with some grief. You may find a way to move on while still cherishing memories and feelings about your loss. Accepting your loss is a process. It can take months or longer to adjust. Keep all follow-up visits as told by your health care provider. This is important. Where to find support To get support for managing loss: Ask your health care provider for help and recommendations, such as grief counseling or therapy. Think about joining a support group for people who are managing a loss. Where to find more information You can find more information about managing loss from: American Society of Clinical Oncology: www.cancer.net American Psychological Association: [employer] Contact a health care provider if: Your grief is extreme and keeps getting worse. You have ongoing grief that does not improve. Your body shows  symptoms of grief, such as illness. You feel depressed, anxious, or lonely. Get help right away if: You have thoughts about hurting yourself or others. If you ever feel like you may hurt yourself or others, or have thoughts about taking your own life, get help right away. You can go to your nearest emergency department or call: Your local emergency services (911 in the [place] A suicide crisis helpline, such as the National Suicide Prevention Lifeline at 971-845-9185. This is open 24 hours a day. Summary Grief is the result of a major change or an absence of someone or something that you count on. Grief is a normal reaction to loss. The depth of grief and the period of recovery depend on the type of loss and your ability to adjust to the change and process your feelings. Processing grief requires patience and a willingness to accept your feelings and talk about your  loss with people who are supportive. It is important to find resources that work for you and to realize that people experience grief differently. There is not one grieving process that works for everyone in the same way. Be aware that when grief becomes extreme, it can lead to more severe issues like isolation, depression, anxiety, or suicidal thoughts. Talk with your health care provider if you have any of these issues. This information is not intended to replace advice given to you by your health care provider. Make sure you discuss any questions you have with your health care provider. Document Revised: 10/10/2018 Document Reviewed: 12/20/2016 Elsevier Patient Education  2020 [employer].  Help for Managing Grief   When you are experiencing grief, many resources and support are available to help you. You are not alone.    Grief Share (https://www.SunglassSpecialist.gl):    [employer] of [address] [place], [place]      [employer]  [phone] [address] [place], [place]     [facility] [address] [place], [place]      [facility] [address] [place], [place]  [phone]   [facility] [address] [place], [place] [phone]   [facility] [address] [place], [place]     [phone]   [facility] [address] [place], [place]     [phone]   If a Hospice or Palliative Care team has been involved in the care of your loved one, you may reach out to your contact there or locally, you may reach out to [facility]:    [facility] [place] [facility]   [address] [place], [place] [address] [phone]   [facility]  [facility] in [place], [place]  [address] [place], [place], [place] [address] Phone: [phone]  If you are experiencing a Mental Health or Behavioral Health Crisis or need someone to talk to, please call the Suicide and Crisis Lifeline: 988        Follow up:  Patient agrees to Care Plan and Follow-up.  Plan: The Managed Medicaid care management team will reach out to the patient again over the next 30 days.  Date/time of next scheduled Social Work care management/care coordination outreach:  07/02/22 at 1 pm  Dickie La, BSW, MSW, [employer] Managed Medicaid LCSW [facility]  [facility] [email or web address] Phone: [phone]

## 2022-06-04 NOTE — Patient Instructions (Signed)
Visit Information  Caroline Ryan was given information about Medicaid Managed Care team care coordination services as a part of their [place] Medicaid benefit. [name] verbally consented to engagement with the [facility] Managed Care team.   If you are experiencing a medical emergency, please call 911 or report to your local emergency department or urgent care.   If you have a non-emergency medical problem during routine business hours, please contact your provider's office and ask to speak with a nurse.   For questions related to your [facility], please call: [phone] or visit the homepage here: [email or web address]  If you would like to schedule transportation through your [facility], please call the following number at least 2 days in advance of your appointment: [phone]   Rides for urgent appointments can also be made after hours by calling Member Services.  Call the [facility] at [phone], at any time, 24 hours a day, 7 days a week. If you are in danger or need immediate medical attention call 911.  If you would like help to quit smoking, call 1-800-QUIT-NOW 3390841317) OR Espaol: 1-855-Djelo-Ya (3-570-177-9390) o para ms informacin haga clic aqu or Text READY to 200-400 to register via text   Following is a copy of your plan of care:  Care Plan : LCSW Plan of Care  Updates made by Greg Cutter, LCSW since 06/04/2022 12:00 AM     Problem: Depression Identification (Depression)      Long-Range Goal: Depressive Symptoms Identified   Start Date: 05/02/2022  Priority: High  Note:   Priority: High  Timeframe:  Long-Range Goal Priority:  High Start Date: 05/02/22              Expected End Date:  ongoing                     Follow Up Date--07/02/22  - check out bereavement counseling and long term  counseling options -resources provided for you to write down due to low usage of email - keep 90 percent of counseling appointments - schedule initial counseling appointment    Why is this important?             Beating grief and depression may take some time.            If you don't feel better right away, don't give up on your treatment plan.    Current barriers:   Chronic Mental Health needs related to depression and grief from losing her daughter in April of 2023. Patient requires Support, Education, Resources, Referrals, Advocacy, and Care Coordination, in order to meet Unmet Mental Health Needs. Patient will implement clinical interventions discussed today to decrease symptoms of grief and increase knowledge and/or ability of: coping skills. Mental Health Concerns and Social Isolation Patient lacks knowledge of available community counseling agencies and resources.  Clinical Goal(s): verbalize understanding of plan for management of Grief, Depression, and Stress and demonstrate a reduction in symptoms. Patient will connect with a provider for ongoing mental health treatment, increase coping skills, healthy habits, self-management skills, and stress reduction        Patient Goals/Self-Care Activities: Over the next 120 days Attend scheduled medical appointments Utilize healthy coping skills and supportive resources discussed Contact PCP with any questions or concerns Keep 90 percent of counseling appointments Call your insurance provider for more information about your Enhanced Benefits  Check out counseling resources provided  Begin personal counseling with LCSW, to reduce and manage symptoms of Grief, Depression and Stress, until well-established with mental health provider Accept all calls from representative at [facility] as an effort to establish ongoing mental health counseling and supportive services.  Incorporate into daily practice - relaxation techniques, deep breathing exercises,  and mindfulness meditation strategies. Talk about feelings with friends, family members, spiritual advisor, etc. Contact LCSW directly [phone]), if you have questions, need assistance, or if additional social work needs are identified between now and our next scheduled telephone outreach call. Call 988 for mental health hotline/crisis line if needed (24/7 available) Try techniques to reduce symptoms of anxiety/negative thinking (deep breathing, distraction, positive self talk, etc)  - develop a personal safety plan - develop a plan to deal with triggers like holidays, anniversaries - exercise at least 2 to 3 times per week - have a plan for how to handle bad days - journal feelings and what helps to feel better or worse - spend time or talk with others at least 2 to 3 times per week - watch for early signs of feeling worse - begin personal counseling - call and visit an old friend - check out volunteer opportunities - join a support group - laugh; watch a funny movie or comedian - learn and use visualization or guided imagery - perform a random act of kindness - practice relaxation or meditation daily - start or continue a personal journal - practice positive thinking and self-talk -continue with compliance of taking medication  -identify current effective and ineffective coping strategies.  -implement positive self-talk in care to increase self-esteem, confidence and feelings of control.  -consider alternative and complementary therapy approaches such as meditation, mindfulness or yoga.  -journaling, prayer, worship services, meditation or pastoral counseling.  -increase participation in pleasurable group activities such as hobbies, singing, sports or volunteering).  -consider the use of meditative movement therapy such as tai chi, yoga or qigong.  -start a regular daily exercise program based on tolerance, ability and patient choice to support positive thinking and activity      Managing Loss, Adult People experience loss in many different ways throughout their lives. Events such as moving, changing jobs, and losing friends can create a sense of loss. The loss may be as serious as a major health change, divorce, death of a pet, or death of a loved one. All of these types of loss are likely to create a physical and emotional reaction known as grief. Grief is the result of a major change or an absence of something or someone that you count on. Grief is a normal reaction to loss. A variety of factors can affect your grieving experience, including: The nature of your loss. Your relationship to what or whom you lost. Your understanding of grief and how to manage it. Your support system. How to manage lifestyle changes Keep to your normal routine as much as possible. If you have trouble focusing or doing normal activities, it is acceptable to take some time away from your normal routine. Spend time with friends and loved ones. Eat a healthy diet, get plenty of sleep, and rest when you feel tired. How to recognize changes  The way that you deal with your grief will affect your ability to function as you normally do. When grieving, you may experience these changes: Numbness, shock, sadness, anxiety, anger, denial, and guilt. Thoughts about death. Unexpected crying. A physical sensation of emptiness in your stomach. Problems sleeping and eating. Tiredness (fatigue).  Loss of interest in normal activities. Dreaming about or imagining seeing the person who died. A need to remember what or whom you lost. Difficulty thinking about anything other than your loss for a period of time. Relief. If you have been expecting the loss for a while, you may feel a sense of relief when it happens. Follow these instructions at home:    Activity Express your feelings in healthy ways, such as: Talking with others about your loss. It may be helpful to find others who have had a similar  loss, such as a support group. Writing down your feelings in a journal. Doing physical activities to release stress and emotional energy. Doing creative activities like painting, sculpting, or playing or listening to music. Practicing resilience. This is the ability to recover and adjust after facing challenges. Reading some resources that encourage resilience may help you to learn ways to practice those behaviors. General instructions Be patient with yourself and others. Allow the grieving process to happen, and remember that grieving takes time. It is likely that you may never feel completely done with some grief. You may find a way to move on while still cherishing memories and feelings about your loss. Accepting your loss is a process. It can take months or longer to adjust. Keep all follow-up visits as told by your health care provider. This is important. Where to find support To get support for managing loss: Ask your health care provider for help and recommendations, such as grief counseling or therapy. Think about joining a support group for people who are managing a loss. Where to find more information You can find more information about managing loss from: American Society of Clinical Oncology: www.cancer.net American Psychological Association: TVStereos.ch Contact a health care provider if: Your grief is extreme and keeps getting worse. You have ongoing grief that does not improve. Your body shows symptoms of grief, such as illness. You feel depressed, anxious, or lonely. Get help right away if: You have thoughts about hurting yourself or others. If you ever feel like you may hurt yourself or others, or have thoughts about taking your own life, get help right away. You can go to your nearest emergency department or call: Your local emergency services (911 in the [place] A suicide crisis helpline, such as the [facility] at 437-320-6734. This is open 24  hours a day. Summary Grief is the result of a major change or an absence of someone or something that you count on. Grief is a normal reaction to loss. The depth of grief and the period of recovery depend on the type of loss and your ability to adjust to the change and process your feelings. Processing grief requires patience and a willingness to accept your feelings and talk about your loss with people who are supportive. It is important to find resources that work for you and to realize that people experience grief differently. There is not one grieving process that works for everyone in the same way. Be aware that when grief becomes extreme, it can lead to more severe issues like isolation, depression, anxiety, or suicidal thoughts. Talk with your health care provider if you have any of these issues. This information is not intended to replace advice given to you by your health care provider. Make sure you discuss any questions you have with your health care provider. Document Revised: 10/10/2018 Document Reviewed: 12/20/2016 Elsevier Patient Education  [place] for Managing Grief   When  you are experiencing grief, many resources and support are available to help you. You are not alone.    Grief Share (https://www.https://jacobson-moore.net/):    [employer] [facility] [place], [place]  [phone] [address] [place], [place], [place], [place]   [place], [place]   [facility] [address] [place], [place]   [place], [place]   If a Hospice or [place] team has been involved in the care of your loved one, you may reach out to your contact there or  locally, you may reach out to [place]:    Hospice and [place] and [facility]   [place], [place] [address] [address] in [place], [place]  [place], [place], [place] [address] Phone: [phone]  If you are experiencing a Mental Health or [place] or need someone to talk to, please call the Suicide and Crisis Lifeline: 43

## 2022-06-05 DIAGNOSIS — F321 Major depressive disorder, single episode, moderate: Secondary | ICD-10-CM | POA: Diagnosis not present

## 2022-06-06 ENCOUNTER — Ambulatory Visit (HOSPITAL_COMMUNITY): Payer: Medicaid Other | Admitting: Mental Health

## 2022-06-06 DIAGNOSIS — F321 Major depressive disorder, single episode, moderate: Secondary | ICD-10-CM | POA: Diagnosis not present

## 2022-06-07 DIAGNOSIS — F321 Major depressive disorder, single episode, moderate: Secondary | ICD-10-CM | POA: Diagnosis not present

## 2022-06-08 ENCOUNTER — Other Ambulatory Visit: Payer: Self-pay | Admitting: *Deleted

## 2022-06-08 DIAGNOSIS — F321 Major depressive disorder, single episode, moderate: Secondary | ICD-10-CM | POA: Diagnosis not present

## 2022-06-08 NOTE — Patient Outreach (Signed)
  Medicaid Managed Care   Unsuccessful Attempt Note   06/08/2022 Name: Caroline Ryan [facility] MRN: [id number] DOB: [birth year]  Referred by: Fenton Foy, NP Reason for referral : High Risk Managed Medicaid (Unsuccessful RNCM follow up outreach)   A second unsuccessful telephone outreach was attempted today. The patient was referred to the case management team for assistance with care management and care coordination.    Follow Up Plan: A HIPAA compliant phone message was left for the patient providing contact information and requesting a return call. and The Managed Medicaid care management team will reach out to the patient again over the next 14 days.    Lurena Joiner RN, BSN [place]  [employer] manager

## 2022-06-08 NOTE — Patient Instructions (Signed)
Visit Information  [name] [facility]  - as a part of your Medicaid benefit, you are eligible for care management and care coordination services at no cost or copay. I was unable to reach you by phone today but would be happy to help you with your health related needs. Please feel free to call me @ [phone].   A member of the Managed Medicaid care management team will reach out to you again over the next 14 days.   Lurena Joiner RN, BSN [place]  [employer] manager

## 2022-06-09 DIAGNOSIS — F321 Major depressive disorder, single episode, moderate: Secondary | ICD-10-CM | POA: Diagnosis not present

## 2022-06-11 ENCOUNTER — Telehealth: Payer: Self-pay | Admitting: Nurse Practitioner

## 2022-06-11 DIAGNOSIS — F321 Major depressive disorder, single episode, moderate: Secondary | ICD-10-CM | POA: Diagnosis not present

## 2022-06-11 NOTE — Telephone Encounter (Signed)
..   Medicaid Managed Care   Unsuccessful Outreach Note  06/11/2022 Name: Brandis Wixted [facility] MRN: [id number] DOB: [birth year]  Referred by: Fenton Foy, NP Reason for referral : High Risk Managed Medicaid (I called the patient today to get her rescheduled with the MM RNCM. I left my name and number on her VM.)   Third unsuccessful telephone outreach was attempted today. The patient was referred to the case management team for assistance with care management and care coordination. The patient's primary care provider has been notified of our unsuccessful attempts to make or maintain contact with the patient. The care management team is pleased to engage with this patient at any time in the future should he/she be interested in assistance from the care management team.   Follow Up Plan: We have been unable to make contact with the patient for follow up. The care management team is available to follow up with the patient after provider conversation with the patient regarding recommendation for care management engagement and subsequent re-referral to the care management team.    [place], [place]

## 2022-06-12 DIAGNOSIS — F321 Major depressive disorder, single episode, moderate: Secondary | ICD-10-CM | POA: Diagnosis not present

## 2022-06-13 DIAGNOSIS — F321 Major depressive disorder, single episode, moderate: Secondary | ICD-10-CM | POA: Diagnosis not present

## 2022-06-14 DIAGNOSIS — F321 Major depressive disorder, single episode, moderate: Secondary | ICD-10-CM | POA: Diagnosis not present

## 2022-06-15 DIAGNOSIS — F321 Major depressive disorder, single episode, moderate: Secondary | ICD-10-CM | POA: Diagnosis not present

## 2022-06-16 DIAGNOSIS — F321 Major depressive disorder, single episode, moderate: Secondary | ICD-10-CM | POA: Diagnosis not present

## 2022-06-18 DIAGNOSIS — F321 Major depressive disorder, single episode, moderate: Secondary | ICD-10-CM | POA: Diagnosis not present

## 2022-06-19 DIAGNOSIS — F321 Major depressive disorder, single episode, moderate: Secondary | ICD-10-CM | POA: Diagnosis not present

## 2022-06-20 DIAGNOSIS — F321 Major depressive disorder, single episode, moderate: Secondary | ICD-10-CM | POA: Diagnosis not present

## 2022-06-21 DIAGNOSIS — F321 Major depressive disorder, single episode, moderate: Secondary | ICD-10-CM | POA: Diagnosis not present

## 2022-06-22 DIAGNOSIS — F321 Major depressive disorder, single episode, moderate: Secondary | ICD-10-CM | POA: Diagnosis not present

## 2022-06-23 DIAGNOSIS — F321 Major depressive disorder, single episode, moderate: Secondary | ICD-10-CM | POA: Diagnosis not present

## 2022-06-24 DIAGNOSIS — G4733 Obstructive sleep apnea (adult) (pediatric): Secondary | ICD-10-CM | POA: Diagnosis not present

## 2022-06-25 ENCOUNTER — Other Ambulatory Visit: Payer: Self-pay

## 2022-06-25 DIAGNOSIS — F321 Major depressive disorder, single episode, moderate: Secondary | ICD-10-CM | POA: Diagnosis not present

## 2022-06-25 NOTE — Patient Outreach (Signed)
  Medicaid Managed Care   Unsuccessful Outreach Note  06/25/2022 Name: Caroline Ryan MRN: [id number] DOB: [birth year]  Referred by: Fenton Foy, NP Reason for referral : High Risk Managed Medicaid (MM social work telephone outreach )   An unsuccessful telephone outreach was attempted today. The patient was referred to the case management team for assistance with care management and care coordination.   Follow Up Plan: A HIPAA compliant phone message was left for the patient providing contact information and requesting a return call.   Mickel Fuchs, BSW, [place] Managed Medicaid Team  [phone]

## 2022-06-25 NOTE — Patient Instructions (Signed)
  Medicaid Managed Care   Unsuccessful Outreach Note  06/25/2022 Name: Caroline Ryan MRN: [id number] DOB: Oct 05, 1965  Referred by: Fenton Foy, NP Reason for referral : High Risk Managed Medicaid (MM social work telephone outreach )   An unsuccessful telephone outreach was attempted today. The patient was referred to the case management team for assistance with care management and care coordination.   Follow Up Plan: A HIPAA compliant phone message was left for the patient providing contact information and requesting a return call.   Mickel Fuchs, BSW, [place] Managed Medicaid Team  [phone]

## 2022-06-26 ENCOUNTER — Ambulatory Visit (HOSPITAL_COMMUNITY): Payer: Medicaid Other | Admitting: Mental Health

## 2022-06-26 DIAGNOSIS — F321 Major depressive disorder, single episode, moderate: Secondary | ICD-10-CM | POA: Diagnosis not present

## 2022-06-27 DIAGNOSIS — F321 Major depressive disorder, single episode, moderate: Secondary | ICD-10-CM | POA: Diagnosis not present

## 2022-06-28 DIAGNOSIS — F321 Major depressive disorder, single episode, moderate: Secondary | ICD-10-CM | POA: Diagnosis not present

## 2022-06-29 DIAGNOSIS — F321 Major depressive disorder, single episode, moderate: Secondary | ICD-10-CM | POA: Diagnosis not present

## 2022-06-30 DIAGNOSIS — F321 Major depressive disorder, single episode, moderate: Secondary | ICD-10-CM | POA: Diagnosis not present

## 2022-07-02 ENCOUNTER — Ambulatory Visit: Payer: Medicaid Other | Admitting: Licensed Clinical Social Worker

## 2022-07-04 ENCOUNTER — Other Ambulatory Visit: Payer: Self-pay | Admitting: Nurse Practitioner

## 2022-07-04 ENCOUNTER — Other Ambulatory Visit: Payer: Self-pay

## 2022-07-04 DIAGNOSIS — G8929 Other chronic pain: Secondary | ICD-10-CM

## 2022-07-05 ENCOUNTER — Other Ambulatory Visit: Payer: Self-pay

## 2022-07-09 ENCOUNTER — Encounter
Payer: Medicaid Other | Attending: Physical Medicine and Rehabilitation | Admitting: Physical Medicine and Rehabilitation

## 2022-07-09 ENCOUNTER — Other Ambulatory Visit: Payer: Self-pay

## 2022-07-09 ENCOUNTER — Other Ambulatory Visit: Payer: Self-pay | Admitting: Physical Medicine and Rehabilitation

## 2022-07-09 ENCOUNTER — Encounter: Payer: Self-pay | Admitting: Physical Medicine and Rehabilitation

## 2022-07-09 VITALS — BP 138/82 | HR 72 | Ht 61.0 in | Wt 255.8 lb

## 2022-07-09 DIAGNOSIS — F321 Major depressive disorder, single episode, moderate: Secondary | ICD-10-CM | POA: Diagnosis not present

## 2022-07-09 DIAGNOSIS — M170 Bilateral primary osteoarthritis of knee: Secondary | ICD-10-CM | POA: Insufficient documentation

## 2022-07-09 MED ORDER — CICLOPIROX-SALICYLIC ACID 0.77-2 % EX SHAM
1.0000 | MEDICATED_SHAMPOO | Freq: Two times a day (BID) | CUTANEOUS | 3 refills | Status: DC
  Filled 2022-07-09: qty 30, fill #0

## 2022-07-09 MED ORDER — TRIAMCINOLONE ACETONIDE 32 MG IX SRER
64.0000 mg | Freq: Once | INTRA_ARTICULAR | Status: AC
  Administered 2022-07-09: 64 mg via INTRA_ARTICULAR

## 2022-07-09 NOTE — Progress Notes (Signed)
Knee injection, bilateral  Indication:Bilateral Knee pain not relieved by medication management and other conservative care.  Informed consent was obtained after describing risks and benefits of the procedure with the patient, this includes bleeding, bruising, infection and medication side effects. The patient wishes to proceed and has given written consent. The patient was placed in a recumbent position. The medial aspect of the knee was marked and prepped with Betadine and alcohol. It was then entered with a 25-gauge 1-1/2 inch needle and Zilretta was  inserted into the knee joint. The patient tolerated the procedure well. Post procedure instructions were given.  

## 2022-07-10 DIAGNOSIS — F321 Major depressive disorder, single episode, moderate: Secondary | ICD-10-CM | POA: Diagnosis not present

## 2022-07-16 ENCOUNTER — Other Ambulatory Visit: Payer: Self-pay | Admitting: Physical Medicine and Rehabilitation

## 2022-07-16 ENCOUNTER — Other Ambulatory Visit: Payer: Self-pay

## 2022-07-16 MED ORDER — METHOCARBAMOL 750 MG PO TABS
750.0000 mg | ORAL_TABLET | Freq: Four times a day (QID) | ORAL | 3 refills | Status: DC
  Filled 2022-07-16 (×2): qty 60, 15d supply, fill #0
  Filled 2022-09-20: qty 120, 30d supply, fill #1
  Filled 2022-10-15: qty 120, 30d supply, fill #2
  Filled 2022-11-15: qty 120, 30d supply, fill #3

## 2022-07-19 ENCOUNTER — Other Ambulatory Visit: Payer: Self-pay

## 2022-07-19 ENCOUNTER — Ambulatory Visit: Payer: Medicaid Other | Admitting: Physical Medicine and Rehabilitation

## 2022-07-19 DIAGNOSIS — F321 Major depressive disorder, single episode, moderate: Secondary | ICD-10-CM | POA: Diagnosis not present

## 2022-07-20 DIAGNOSIS — F321 Major depressive disorder, single episode, moderate: Secondary | ICD-10-CM | POA: Diagnosis not present

## 2022-07-23 ENCOUNTER — Encounter: Payer: Self-pay | Admitting: Cardiology

## 2022-07-23 ENCOUNTER — Ambulatory Visit (HOSPITAL_COMMUNITY): Payer: Medicaid Other | Attending: Cardiology

## 2022-07-23 DIAGNOSIS — I77810 Thoracic aortic ectasia: Secondary | ICD-10-CM | POA: Insufficient documentation

## 2022-07-23 DIAGNOSIS — R9431 Abnormal electrocardiogram [ECG] [EKG]: Secondary | ICD-10-CM | POA: Insufficient documentation

## 2022-07-23 DIAGNOSIS — I10 Essential (primary) hypertension: Secondary | ICD-10-CM | POA: Insufficient documentation

## 2022-07-23 DIAGNOSIS — R600 Localized edema: Secondary | ICD-10-CM | POA: Diagnosis not present

## 2022-07-23 DIAGNOSIS — F321 Major depressive disorder, single episode, moderate: Secondary | ICD-10-CM | POA: Diagnosis not present

## 2022-07-23 DIAGNOSIS — I340 Nonrheumatic mitral (valve) insufficiency: Secondary | ICD-10-CM | POA: Diagnosis not present

## 2022-07-23 LAB — ECHOCARDIOGRAM COMPLETE
Area-P 1/2: 6.32 cm2
MV M vel: 5.2 m/s
MV Peak grad: 108.2 mmHg
S' Lateral: 2.8 cm

## 2022-07-24 ENCOUNTER — Telehealth: Payer: Self-pay | Admitting: Licensed Clinical Social Worker

## 2022-07-24 DIAGNOSIS — G4733 Obstructive sleep apnea (adult) (pediatric): Secondary | ICD-10-CM | POA: Diagnosis not present

## 2022-07-24 DIAGNOSIS — F321 Major depressive disorder, single episode, moderate: Secondary | ICD-10-CM | POA: Diagnosis not present

## 2022-07-24 NOTE — Patient Outreach (Signed)
Medicaid Managed Care Social Work Note  07/24/2022 Name:  Caroline Ryan MRN:  [id number] DOB:  1966/03/12  Caroline Ryan is an 56 y.o. year old female who is a primary patient of Caroline Andrew, NP.  The Medicaid Managed Care Coordination team was consulted for assistance with:  Mental Health Counseling and Resources  Caroline Ryan was given information about Medicaid Managed Care Coordination team services today. Caroline Ryan Patient agreed to services and verbal consent obtained.  Engaged with patient  for by telephone forfollow up visit in response to referral for case management and/or care coordination services.   Assessments/Interventions:  Review of past medical history, allergies, medications, health status, including review of consultants reports, laboratory and other test data, was performed as part of comprehensive evaluation and provision of chronic care management services.  SDOH: (Social Determinant of Health) assessments and interventions performed: SDOH Interventions    Flowsheet Row Telephone from 07/24/2022 in [place] POPULATION HEALTH DEPARTMENT Patient Outreach Telephone from 05/21/2022 in [facility] Care Coordination Patient Outreach Telephone from 05/02/2022 in [facility] Care Coordination Patient Outreach Telephone from 03/09/2022 in [facility] Office Visit from 08/09/2020 in [facility] [employer] [facility] [employer] [facility]  SDOH Interventions       Food Insecurity Interventions -- -- -- Other (Comment)  [BSW referral for assistance with transportation to food pantries and non medical errands] 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  Housing Interventions -- -- -- Intervention Not Indicated --  Transportation Interventions -- -- -- Other (Comment)  [UHC transportion [phone]] [employer]  Depression Interventions/Treatment  --  Counseling Counseling -- --  Stress Interventions Provide Counseling, Offered [employer] Resources Provide Counseling, Offered [employer] [facility] Resources Offered [employer], Provide Counseling -- --       Advanced Directives Status:  See Care Plan for related entries.  Care Plan                 No Known Allergies  Medications Reviewed Today     Reviewed by Caroline Bryant, LCSW (Social Worker) on 06/04/22 at 1427  Med List Status: <None>   Medication Order Taking? Sig Documenting Provider Last Dose Status Informant  acetaminophen (TYLENOL) 500 MG tablet [id number] No Take 2 tablets (1,000 mg total) by mouth 2 (two) times daily as needed.  Patient not taking: Reported on 05/14/2022   [name], FNP Not Taking Active   albuterol (VENTOLIN HFA) 108 (90 Base) MCG/ACT inhaler [id number] No Inhale 1 puff every 6 hours as needed for shortness of breath [name], NP Taking Active   aspirin 81 MG chewable tablet [id number] No Chew by mouth daily. [provider] Taking Active   carvedilol (COREG) 25 MG tablet [id number] No TAKE 1 TABLET (25 MG TOTAL) BY MOUTH 2 (TWO) TIMES DAILY. [name], MD Taking Active   cetirizine (ZYRTEC) 10 MG tablet [id number] No Take 1 tablet (10 mg total) by mouth daily. [name], NP Taking Active   cloNIDine (CATAPRES - DOSED IN MG/24 HR) 0.1 mg/24hr patch [id number] No Place 1 patch (0.1 mg total) onto the skin once a week. [name], MD Taking Active   cyclobenzaprine (FLEXERIL) 5 MG tablet [id number] No Take 1 tablet (5 mg total) by mouth 3 (three) times daily as needed for muscle spasms. Caroline Andrew, NP Taking Active   diclofenac Sodium (VOLTAREN) 1 % GEL [id number]  No Apply 4 g topically 4 (four) times daily. [name], NP Taking Active   docusate sodium (COLACE) 100 MG capsule [id number] No Take 1 capsule (100 mg total) by mouth 2 (two) times daily as needed. [name], NP  Taking Active   Efinaconazole 10 % SOLN [id number]  Apply 1 Application topically daily. Caroline Andrew, NP  Active   furosemide (LASIX) 20 MG tablet [id number] No Take 1 tablet (20 mg total) by mouth daily. [name], MD Taking Expired 04/24/22 2359   hydrocortisone 2.5 % cream [id number] No Apply topically 2 (two) times daily. [name], NP Taking Active   hydrOXYzine (ATARAX) 25 MG tablet [id number] No Take 1 tablet (25 mg total) by mouth 3 (three) times daily as needed. [name], NP Taking Active   ibuprofen (ADVIL) 800 MG tablet [id number] No Take 1 tablet (800 mg total) by mouth 3 (three) times daily. [name], MD Taking Active   linaclotide [name]) 72 MCG capsule [id number] No Take 1 capsule (72 mcg total) by mouth daily before breakfast. [name], NP Taking Active   methocarbamol (ROBAXIN) 750 MG tablet [id number]  Take 1 tablet (750 mg total) by mouth 3 (three) times daily. Caroline Andrew, NP  Active   montelukast (SINGULAIR) 10 MG tablet [id number] No TAKE 1 TABLET (10 MG TOTAL) BY MOUTH AT BEDTIME. [name], FNP Taking Expired 04/24/22 2359   NONFORMULARY OR COMPOUNDED ITEM [id number] No Diclofenac 3%, Gabapentin 5%, Lidocaine 5%, Menthol 1%.  Apply 1-2 pumps three to four times per day as needed  Patient not taking: Reported on 03/23/2022   [name], MD Not Taking Active   olmesartan (BENICAR) 40 MG tablet [id number] No TAKE 1 TABLET (40 MG TOTAL) BY MOUTH DAILY. [name], MD Taking Active   spironolactone (ALDACTONE) 100 MG tablet [id number] No TAKE 1 TABLET (100 MG TOTAL) BY MOUTH DAILY. [name], MD Taking Active   Triamcinolone Acetonide [facility]) intra-articular injection 32 mg [id number]   [name], MD  Active   Triamcinolone Acetonide (ZILRETTA) intra-articular injection 32 mg [id number]   [name], MD  Active             Patient Active Problem List   Diagnosis Date Noted   Chronic  pain of both knees 05/16/2022   OSA (obstructive sleep apnea) 11/09/2021   Mitral regurgitation 09/07/2021   Dilated aortic root (HCC) 09/07/2021   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/2020   Unilateral primary osteoarthritis, left knee 10/22/2019   Unilateral primary osteoarthritis, right knee 10/22/2019   BMI 50.0-59.9, adult (HCC) 10/22/2019   Grieving 06/10/2019   Pruritus 06/10/2019   Lower extremity edema 11/02/2018   Post-operative state 10/10/2017   Symptomatic anemia 05/20/2017   Arthralgia of both knees 06/27/2015   BACK STRAIN, LUMBAR 03/16/2010   KNEE PAIN, BILATERAL 06/30/2009   OBESITY 04/13/2009   Edema 04/13/2009   FIBROADENOMA, BREAST 04/20/2008   HYPERTENSION, BENIGN ESSENTIAL 04/14/2008   Allergic rhinitis 04/14/2008   Asthma 04/14/2008    Conditions to be addressed/monitored per PCP order:  Depression  Care Plan : LCSW Plan of Care  Updates made by Caroline Bryant, LCSW since 07/24/2022 12:00 AM     Problem: Depression Identification (Depression)      Long-Range Goal: Depressive Symptoms Identified   Start Date: 05/02/2022  Priority: High  Note:   Priority: High  Timeframe:  Long-Range Goal Priority:  High Start Date: 05/02/22  Expected End Date:  ongoing                     Follow Up Date--128/23 at 21  - check out bereavement counseling and long term counseling options -resources provided for you to write down due to low usage of email - keep 90 percent of counseling appointments - schedule initial counseling appointment    Why is this important?             Beating grief and depression may take some time.            If you don't feel better right away, don't give up on your treatment plan.    Current barriers:   Chronic Mental Health needs related to depression and grief from losing her daughter in April of 2023. Patient requires Support, Education, Resources, Referrals, Advocacy, and Care Coordination, in order to meet Unmet Mental Health  Needs. Patient will implement clinical interventions discussed today to decrease symptoms of grief and increase knowledge and/or ability of: coping skills. Mental Health Concerns and Social Isolation Patient lacks knowledge of available community counseling agencies and resources.  Clinical Goal(s): verbalize understanding of plan for management of Grief, Depression, and Stress and demonstrate a reduction in symptoms. Patient will connect with a provider for ongoing mental health treatment, increase coping skills, healthy habits, self-management skills, and stress reduction        Clinical Interventions:  Assessed patient's previous and current treatment, coping skills, support system and barriers to care. Patient is experiencing grief due to the loss of a loved one but prefers long term therapy over brief grief counseling. Verbalization of feelings encouraged, motivational interviewing employed Emotional support provided, positive coping strategies explored Self care emphasized Patient is agreeable to referral to [facility] for individual counseling only. She does not want psychiatry. Patient reports significant worsening grief and depression impacting her ability to function appropriately and carry out daily task. LCSW provided education on relaxation techniques such as meditation, deep breathing, massage, grounding exercises or yoga that can activate the body's relaxation response and ease symptoms of stress and anxiety. LCSW ask that when pt is struggling with difficult emotions and racing thoughts that they start this relaxation response process. LCSW provided extensive education on healthy coping skills for anxiety. SW used active and reflective listening, validated patient's feelings/concerns, and provided emotional support. Patient will work on implementing appropriate self-care habits into their daily routine such as: staying positive, writing a gratitude list, drinking water, staying active around  the house, taking their medications as prescribed, combating negative thoughts or emotions and staying connected with their family and friends. Positive reinforcement provided for this decision to work on this. Motivational Interviewing employed Depression screen reviewed  PHQ2/ PHQ9 completed Mindfulness or Relaxation training provided Active listening / Reflection utilized  Advance Care and HCPOA education provided Emotional Support Provided Problem Solving /Task Center strategies reviewed Provided psychoeducation for mental health needs  Provided brief CBT  Reviewed mental health medications and discussed importance of compliance:  Quality of sleep assessed & Sleep Hygiene techniques promoted  Participation in counseling encouraged  Verbalization of feelings encouraged  Suicidal Ideation/Homicidal Ideation assessed: Patient denies SI/HI  Review resources, discussed options and provided patient information about  Mental Health Resources Inter-disciplinary care team collaboration (see longitudinal plan of care) UPDATE- Pt reports that she is still having issues with transportation and has planned to get that sorted out this week. She request a follow up appointment with [facility] LCSW  2 weeks from now so that she can set up her intake appointment once she knows her transportation is secure. [facility] LCSW provided brief coping skill education. UPDATE- [facility] LCSW was informed that patient had a family emergency and she is currently in [place] unable to get to her intake appointment at [facility] this week. Patient was provided crisis support resources and [facility] contact information again. Patient is agreeable to contact agency and [facility] LCSW sent in basket message to [facility] assistant. [facility] LCSW will follow up again next month to ensure that patient's needs were addressed. Update- Patient has been unable to reach [facility]  to reschedule missed intake appt  due to a family emergency. Patient has called several times and has not  heard back. [facility] LCSW informed [facility] team by secure instant message. Patient Goals/Self-Care Activities: Over the next 120 days Attend scheduled medical appointments Utilize healthy coping skills and supportive resources discussed Contact PCP with any questions or concerns Keep 90 percent of counseling appointments Call your insurance provider for more information about your Enhanced Benefits  Check out counseling resources provided  Begin personal counseling with LCSW, to reduce and manage symptoms of Grief, Depression and Stress, until well-established with mental health provider Accept all calls from representative at [email or web address] as an effort to establish ongoing mental health counseling and supportive services.  Incorporate into daily practice - relaxation techniques, deep breathing exercises, and mindfulness meditation strategies. Talk about feelings with friends, family members, spiritual advisor, etc. Contact LCSW directly [phone]), if you have questions, need assistance, or if additional social work needs are identified between now and our next scheduled telephone outreach call. Call [phone] for mental health hotline/crisis line if needed (24/7 available) Try techniques to reduce symptoms of anxiety/negative thinking (deep breathing, distraction, positive self talk, etc)  - develop a personal safety plan - develop a plan to deal with triggers like holidays, anniversaries - exercise at least 2 to 3 times per week - have a plan for how to handle bad days - journal feelings and what helps to feel better or worse - spend time or talk with others at least 2 to 3 times per week - watch for early signs of feeling worse - begin personal counseling - call and visit an old friend - check out volunteer opportunities - join a support group - laugh; watch a funny movie or comedian - learn and use visualization or guided imagery - perform a random act of kindness - practice relaxation or  meditation daily - start or continue a personal journal - practice positive thinking and self-talk -continue with compliance of taking medication  -identify current effective and ineffective coping strategies.  -implement positive self-talk in care to increase self-esteem, confidence and feelings of control.  -consider alternative and complementary therapy approaches such as meditation, mindfulness or yoga.  -journaling, prayer, worship services, meditation or pastoral counseling.  -increase participation in pleasurable group activities such as hobbies, singing, sports or volunteering).  -consider the use of meditative movement therapy such as tai chi, yoga or qigong.  -start a regular daily exercise program based on tolerance, ability and patient choice to support positive thinking and activity     Managing Loss, Adult People experience loss in many different ways throughout their lives. Events such as moving, changing jobs, and losing friends can create a sense of loss. The loss may be as serious as a major health change, divorce, death of a pet, or death of a loved one. All of these types of  loss are likely to create a physical and emotional reaction known as grief. Grief is the result of a major change or an absence of something or someone that you count on. Grief is a normal reaction to loss. A variety of factors can affect your grieving experience, including: The nature of your loss. Your relationship to what or whom you lost. Your understanding of grief and how to manage it. Your support system. How to manage lifestyle changes Keep to your normal routine as much as possible. If you have trouble focusing or doing normal activities, it is acceptable to take some time away from your normal routine. Spend time with friends and loved ones. Eat a healthy diet, get plenty of sleep, and rest when you feel tired. How to recognize changes  The way that you deal with your grief will affect your  ability to function as you normally do. When grieving, you may experience these changes: Numbness, shock, sadness, anxiety, anger, denial, and guilt. Thoughts about death. Unexpected crying. A physical sensation of emptiness in your stomach. Problems sleeping and eating. Tiredness (fatigue). Loss of interest in normal activities. Dreaming about or imagining seeing the person who died. A need to remember what or whom you lost. Difficulty thinking about anything other than your loss for a period of time. Relief. If you have been expecting the loss for a while, you may feel a sense of relief when it happens. Follow these instructions at home:    Activity Express your feelings in healthy ways, such as: Talking with others about your loss. It may be helpful to find others who have had a similar loss, such as a support group. Writing down your feelings in a journal. Doing physical activities to release stress and emotional energy. Doing creative activities like painting, sculpting, or playing or listening to music. Practicing resilience. This is the ability to recover and adjust after facing challenges. Reading some resources that encourage resilience may help you to learn ways to practice those behaviors. General instructions Be patient with yourself and others. Allow the grieving process to happen, and remember that grieving takes time. It is likely that you may never feel completely done with some grief. You may find a way to move on while still cherishing memories and feelings about your loss. Accepting your loss is a process. It can take months or longer to adjust. Keep all follow-up visits as told by your health care provider. This is important. Where to find support To get support for managing loss: Ask your health care provider for help and recommendations, such as grief counseling or therapy. Think about joining a support group for people who are managing a loss. Where to find more  information You can find more information about managing loss from: American Society of Clinical Oncology: www.cancer.net American Psychological Association: [employer] Contact a health care provider if: Your grief is extreme and keeps getting worse. You have ongoing grief that does not improve. Your body shows symptoms of grief, such as illness. You feel depressed, anxious, or lonely. Get help right away if: You have thoughts about hurting yourself or others. If you ever feel like you may hurt yourself or others, or have thoughts about taking your own life, get help right away. You can go to your nearest emergency department or call: Your local emergency services (911 in the [place] A suicide crisis helpline, such as the National Suicide Prevention Lifeline at 479-214-8638. This is open 24 hours a day. Summary Grief is the  result of a major change or an absence of someone or something that you count on. Grief is a normal reaction to loss. The depth of grief and the period of recovery depend on the type of loss and your ability to adjust to the change and process your feelings. Processing grief requires patience and a willingness to accept your feelings and talk about your loss with people who are supportive. It is important to find resources that work for you and to realize that people experience grief differently. There is not one grieving process that works for everyone in the same way. Be aware that when grief becomes extreme, it can lead to more severe issues like isolation, depression, anxiety, or suicidal thoughts. Talk with your health care provider if you have any of these issues. This information is not intended to replace advice given to you by your health care provider. Make sure you discuss any questions you have with your health care provider. Document Revised: 10/10/2018 Document Reviewed: 12/20/2016 Elsevier Patient Education  2020 [employer].  Help for Managing Grief    When you are experiencing grief, many resources and support are available to help you. You are not alone.    Grief Share (https://www.SunglassSpecialist.gl):    [employer] of [address] [place], [place]      [employer]  [phone] [address] [place], [place]     [facility] [address] [place], [place]      [facility] [address] [place], [place]  [phone]   [facility] [address] [place], [place] [phone]   [facility] [address] [place], [place]     [phone]   [facility] [address] [place], [place]     [phone]   If a Hospice or Palliative Care team has been involved in the care of your loved one, you may reach out to your contact there or locally, you may reach out to [facility]:    [facility] [place] [facility]   [address] [place], [place] [address] [phone]   [facility]  [facility] [place], [place]  [address], [place], [place] [address] Phone: [phone]  If you are experiencing a Mental Health or Behavioral Health Crisis or need someone to talk to, please call the Suicide and Crisis Lifeline: 988        Follow up:  Patient agrees to Care Plan and Follow-up.  Plan: The Managed Medicaid care management team will reach out to the patient again over the next 7 days.  Date/time of next scheduled Social Work care management/care coordination outreach:  07/27/22 at 11  Dickie La, BSW, MSW, [employer] Managed Medicaid LCSW [facility]  [facility] [email or web address] Phone: [phone]

## 2022-07-24 NOTE — Patient Instructions (Signed)
Visit Information  Caroline Ryan was given information about Medicaid Managed Care team care coordination services as a part of their [place] [employer] benefit. Harrison Paulson List verbally consented to engagement with the [facility] Managed Care team.   If you are experiencing a medical emergency, please call 911 or report to your local emergency department or urgent care.   If you have a non-emergency medical problem during routine business hours, please contact your provider's office and ask to speak with a nurse.   For questions related to your [facility], please call: [phone] or visit the homepage here: [email or web address]  If you would like to schedule transportation through your [facility], please call the following number at least 2 days in advance of your appointment: [phone]   Rides for urgent appointments can also be made after hours by calling Member Services.  Call the [place] at [phone], at any time, 24 hours a day, 7 days a week. If you are in danger or need immediate medical attention call 911.  If you would like help to quit smoking, call 1-800-QUIT-NOW (762)868-6152) OR Espaol: 1-855-Djelo-Ya (0-623-762-8315) o para ms informacin haga clic aqu or Text READY to 200-400 to register via text  Following is a copy of your plan of care:  Care Plan : LCSW Plan of Care  Updates made by Greg Cutter, LCSW since 07/24/2022 12:00 AM     Problem: Depression Identification (Depression)      Long-Range Goal: Depressive Symptoms Identified   Start Date: 05/02/2022  Priority: High  Note:   Priority: High  Timeframe:  Long-Range Goal Priority:  High Start Date: 05/02/22              Expected End Date:  ongoing                     Follow Up Date--128/23 at 62  - check out bereavement counseling and long term  counseling options -resources provided for you to write down due to low usage of email - keep 90 percent of counseling appointments - schedule initial counseling appointment    Why is this important?             Beating grief and depression may take some time.            If you don't feel better right away, don't give up on your treatment plan.    Current barriers:   Chronic Mental Health needs related to depression and grief from losing her daughter in April of 2023. Patient requires Support, Education, Resources, Referrals, Advocacy, and Care Coordination, in order to meet Unmet Mental Health Needs. Patient will implement clinical interventions discussed today to decrease symptoms of grief and increase knowledge and/or ability of: coping skills. Mental Health Concerns and Social Isolation Patient lacks knowledge of available community counseling agencies and resources.  Clinical Goal(s): verbalize understanding of plan for management of Grief, Depression, and Stress and demonstrate a reduction in symptoms. Patient will connect with a provider for ongoing mental health treatment, increase coping skills, healthy habits, self-management skills, and stress reduction        Patient Goals/Self-Care Activities: Over the next 120 days Attend scheduled medical appointments Utilize healthy coping skills and supportive resources discussed Contact PCP with any questions or concerns Keep 90 percent of counseling appointments Call your insurance provider for more information about your Enhanced Benefits  Check out counseling resources provided  Begin personal counseling with LCSW, to reduce and manage symptoms of Grief, Depression and Stress, until well-established with mental health provider Accept all calls from representative at [facility] as an effort to establish ongoing mental health counseling and supportive services.  Incorporate into daily practice - relaxation techniques, deep breathing exercises,  and mindfulness meditation strategies. Talk about feelings with friends, family members, spiritual advisor, etc. Contact LCSW directly [phone]), if you have questions, need assistance, or if additional social work needs are identified between now and our next scheduled telephone outreach call. Call 988 for mental health hotline/crisis line if needed (24/7 available) Try techniques to reduce symptoms of anxiety/negative thinking (deep breathing, distraction, positive self talk, etc)  - develop a personal safety plan - develop a plan to deal with triggers like holidays, anniversaries - exercise at least 2 to 3 times per week - have a plan for how to handle bad days - journal feelings and what helps to feel better or worse - spend time or talk with others at least 2 to 3 times per week - watch for early signs of feeling worse - begin personal counseling - call and visit an old friend - check out volunteer opportunities - join a support group - laugh; watch a funny movie or comedian - learn and use visualization or guided imagery - perform a random act of kindness - practice relaxation or meditation daily - start or continue a personal journal - practice positive thinking and self-talk -continue with compliance of taking medication  -identify current effective and ineffective coping strategies.  -implement positive self-talk in care to increase self-esteem, confidence and feelings of control.  -consider alternative and complementary therapy approaches such as meditation, mindfulness or yoga.  -journaling, prayer, worship services, meditation or pastoral counseling.  -increase participation in pleasurable group activities such as hobbies, singing, sports or volunteering).  -consider the use of meditative movement therapy such as tai chi, yoga or qigong.  -start a regular daily exercise program based on tolerance, ability and patient choice to support positive thinking and activity      Managing Loss, Adult People experience loss in many different ways throughout their lives. Events such as moving, changing jobs, and losing friends can create a sense of loss. The loss may be as serious as a major health change, divorce, death of a pet, or death of a loved one. All of these types of loss are likely to create a physical and emotional reaction known as grief. Grief is the result of a major change or an absence of something or someone that you count on. Grief is a normal reaction to loss. A variety of factors can affect your grieving experience, including: The nature of your loss. Your relationship to what or whom you lost. Your understanding of grief and how to manage it. Your support system. How to manage lifestyle changes Keep to your normal routine as much as possible. If you have trouble focusing or doing normal activities, it is acceptable to take some time away from your normal routine. Spend time with friends and loved ones. Eat a healthy diet, get plenty of sleep, and rest when you feel tired. How to recognize changes  The way that you deal with your grief will affect your ability to function as you normally do. When grieving, you may experience these changes: Numbness, shock, sadness, anxiety, anger, denial, and guilt. Thoughts about death. Unexpected crying. A physical sensation of emptiness in your stomach. Problems sleeping and eating. Tiredness (fatigue).  Loss of interest in normal activities. Dreaming about or imagining seeing the person who died. A need to remember what or whom you lost. Difficulty thinking about anything other than your loss for a period of time. Relief. If you have been expecting the loss for a while, you may feel a sense of relief when it happens. Follow these instructions at home:    Activity Express your feelings in healthy ways, such as: Talking with others about your loss. It may be helpful to find others who have had a similar  loss, such as a support group. Writing down your feelings in a journal. Doing physical activities to release stress and emotional energy. Doing creative activities like painting, sculpting, or playing or listening to music. Practicing resilience. This is the ability to recover and adjust after facing challenges. Reading some resources that encourage resilience may help you to learn ways to practice those behaviors. General instructions Be patient with yourself and others. Allow the grieving process to happen, and remember that grieving takes time. It is likely that you may never feel completely done with some grief. You may find a way to move on while still cherishing memories and feelings about your loss. Accepting your loss is a process. It can take months or longer to adjust. Keep all follow-up visits as told by your health care provider. This is important. Where to find support To get support for managing loss: Ask your health care provider for help and recommendations, such as grief counseling or therapy. Think about joining a support group for people who are managing a loss. Where to find more information You can find more information about managing loss from: American Society of Clinical Oncology: www.cancer.net American Psychological Association: TVStereos.ch Contact a health care provider if: Your grief is extreme and keeps getting worse. You have ongoing grief that does not improve. Your body shows symptoms of grief, such as illness. You feel depressed, anxious, or lonely. Get help right away if: You have thoughts about hurting yourself or others. If you ever feel like you may hurt yourself or others, or have thoughts about taking your own life, get help right away. You can go to your nearest emergency department or call: Your local emergency services (911 in the [place] A suicide crisis helpline, such as the [facility] at 437-320-6734. This is open 24  hours a day. Summary Grief is the result of a major change or an absence of someone or something that you count on. Grief is a normal reaction to loss. The depth of grief and the period of recovery depend on the type of loss and your ability to adjust to the change and process your feelings. Processing grief requires patience and a willingness to accept your feelings and talk about your loss with people who are supportive. It is important to find resources that work for you and to realize that people experience grief differently. There is not one grieving process that works for everyone in the same way. Be aware that when grief becomes extreme, it can lead to more severe issues like isolation, depression, anxiety, or suicidal thoughts. Talk with your health care provider if you have any of these issues. This information is not intended to replace advice given to you by your health care provider. Make sure you discuss any questions you have with your health care provider. Document Revised: 10/10/2018 Document Reviewed: 12/20/2016 Elsevier Patient Education  [place] for Managing Grief   When  you are experiencing grief, many resources and support are available to help you. You are not alone.    Grief Share (https://www.https://jacobson-moore.net/):    [employer] [facility] [place], [place]  [phone] [address] [place], [place], [place], [place]   [place], [place]   [facility] [address] [place], [place]   [place], [place]   If a Hospice or [facility] team has been involved in the care of your loved one, you may reach out to your contact there or  locally, you may reach out to [place]:    Hospice and [place] and [facility]   [place], [place] [address] [address] in [place], [place]  [place], [place], [place] [address] Phone: [phone]  If you are experiencing a Mental Health or [place] or need someone to talk to, please call the Suicide and Crisis Lifeline: 43

## 2022-07-25 ENCOUNTER — Telehealth: Payer: Self-pay | Admitting: Cardiology

## 2022-07-25 DIAGNOSIS — I77810 Thoracic aortic ectasia: Secondary | ICD-10-CM

## 2022-07-25 NOTE — Telephone Encounter (Signed)
Left message for patient to call back  

## 2022-07-25 NOTE — Telephone Encounter (Signed)
° °  Pt is returning call to get echo result °

## 2022-07-25 NOTE — Telephone Encounter (Signed)
-----   Message from [name], MD sent at 07/23/2022  4:20 PM EST ----- Echo showed normal heart function with mildly thickened heart muscle, increased stiffness of heart muscle, mildly leaky MV and mildly dilated ascending aorta - repeat echo in 1 year for dilated aorta

## 2022-07-26 NOTE — Telephone Encounter (Signed)
Pt is returning call.  

## 2022-07-26 NOTE — Telephone Encounter (Signed)
The patient has been notified of the result and verbalized understanding.  All questions (if any) were answered. Michaelyn Barter, RN 07/26/2022 11:32 AM

## 2022-07-27 ENCOUNTER — Other Ambulatory Visit: Payer: Medicaid Other | Admitting: Licensed Clinical Social Worker

## 2022-07-27 NOTE — Patient Outreach (Signed)
Medicaid Managed Care Social Work Note  07/27/2022 Name:  Caroline Ryan MRN:  [id number] DOB:  1965/10/17  Caroline Ryan is an 56 y.o. year old female who is a primary patient of Caroline Andrew, NP.  The Medicaid Managed Care Coordination team was consulted for assistance with:  Mental Health Counseling and Resources  Caroline Ryan was given information about Medicaid Managed Care Coordination team services today. Caroline Ryan Patient agreed to services and verbal consent obtained.  Engaged with patient  for by telephone forfollow up visit in response to referral for case management and/or care coordination services.   Assessments/Interventions:  Review of past medical history, allergies, medications, health status, including review of consultants reports, laboratory and other test data, was performed as part of comprehensive evaluation and provision of chronic care management services.  SDOH: (Social Determinant of Health) assessments and interventions performed: SDOH Interventions    Flowsheet Row Patient Outreach Telephone from 07/27/2022 in [place] POPULATION HEALTH DEPARTMENT Telephone from 07/24/2022 in [place] POPULATION HEALTH DEPARTMENT Patient Outreach Telephone from 05/21/2022 in [facility] Care Coordination Patient Outreach Telephone from 05/02/2022 in [facility] Care Coordination Patient Outreach Telephone from 03/09/2022 in [facility] Office Visit from 08/09/2020 in [facility] [employer] [facility] [employer] [facility]  SDOH Interventions        Food Insecurity Interventions -- -- -- -- Other (Comment)  [BSW referral for assistance with transportation to food pantries and non medical errands] 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  Housing Interventions -- -- -- -- Intervention Not Indicated --  Transportation Interventions -- -- -- -- Other (Comment)  [UHC  transportion [id number]] [employer]  Depression Interventions/Treatment  Referral to Psychiatry -- Counseling Counseling -- --  Stress Interventions Provide Counseling, Offered [employer] Resources Provide Counseling, Offered [employer] Resources Provide Counseling, Offered [employer] [facility] Resources Offered [employer], Provide Counseling -- --       Advanced Directives Status:  See Care Plan for related entries.  Care Plan                 No Known Allergies  Medications Reviewed Today     Reviewed by Caroline Bryant, LCSW (Social Worker) on 06/04/22 at 1427  Med List Status: <None>   Medication Order Taking? Sig Documenting Provider Last Dose Status Informant  acetaminophen (TYLENOL) 500 MG tablet [id number] No Take 2 tablets (1,000 mg total) by mouth 2 (two) times daily as needed.  Patient not taking: Reported on 05/14/2022   [name], FNP Not Taking Active   albuterol (VENTOLIN HFA) 108 (90 Base) MCG/ACT inhaler [id number] No Inhale 1 puff every 6 hours as needed for shortness of breath [name], NP Taking Active   aspirin 81 MG chewable tablet [id number] No Chew by mouth daily. [provider] Taking Active   carvedilol (COREG) 25 MG tablet [id number] No TAKE 1 TABLET (25 MG TOTAL) BY MOUTH 2 (TWO) TIMES DAILY. [name], MD Taking Active   cetirizine (ZYRTEC) 10 MG tablet [id number] No Take 1 tablet (10 mg total) by mouth daily. [name], NP Taking Active   cloNIDine (CATAPRES - DOSED IN MG/24 HR) 0.1 mg/24hr patch [id number] No Place 1 patch (0.1 mg total) onto the skin once a week. [name], MD Taking Active   cyclobenzaprine (FLEXERIL) 5 MG tablet [id number] No Take 1 tablet (5 mg total) by mouth  3 (three) times daily as needed for muscle spasms. Caroline Andrew, NP Taking Active   diclofenac Sodium (VOLTAREN) 1 % GEL [id number] No Apply 4 g topically 4 (four) times daily. [name], NP Taking Active   docusate sodium (COLACE) 100 MG capsule [id number] No Take 1 capsule (100 mg total) by mouth 2 (two) times daily as needed. [name], NP Taking Active   Efinaconazole 10 % SOLN [id number]  Apply 1 Application topically daily. Caroline Andrew, NP  Active   furosemide (LASIX) 20 MG tablet [id number] No Take 1 tablet (20 mg total) by mouth daily. [name], MD Taking Expired 04/24/22 2359   hydrocortisone 2.5 % cream [id number] No Apply topically 2 (two) times daily. [name], NP Taking Active   hydrOXYzine (ATARAX) 25 MG tablet [id number] No Take 1 tablet (25 mg total) by mouth 3 (three) times daily as needed. [name], NP Taking Active   ibuprofen (ADVIL) 800 MG tablet [id number] No Take 1 tablet (800 mg total) by mouth 3 (three) times daily. [name], MD Taking Active   linaclotide [name]) 72 MCG capsule [id number] No Take 1 capsule (72 mcg total) by mouth daily before breakfast. [name], NP Taking Active   methocarbamol (ROBAXIN) 750 MG tablet [id number]  Take 1 tablet (750 mg total) by mouth 3 (three) times daily. Caroline Andrew, NP  Active   montelukast (SINGULAIR) 10 MG tablet [id number] No TAKE 1 TABLET (10 MG TOTAL) BY MOUTH AT BEDTIME. [name], FNP Taking Expired 04/24/22 2359   NONFORMULARY OR COMPOUNDED ITEM [id number] No Diclofenac 3%, Gabapentin 5%, Lidocaine 5%, Menthol 1%.  Apply 1-2 pumps three to four times per day as needed  Patient not taking: Reported on 03/23/2022   [name], MD Not Taking Active   olmesartan (BENICAR) 40 MG tablet [id number] No TAKE 1 TABLET (40 MG TOTAL) BY MOUTH DAILY. [name], MD Taking Active   spironolactone (ALDACTONE) 100 MG tablet [id number] No TAKE 1 TABLET (100 MG TOTAL) BY MOUTH DAILY. [name], MD Taking Active   Triamcinolone Acetonide [facility]) intra-articular injection 32 mg [id number]   [name], MD  Active   Triamcinolone  Acetonide (ZILRETTA) intra-articular injection 32 mg [id number]   [name], MD  Active             Patient Active Problem List   Diagnosis Date Noted   Chronic pain of both knees 05/16/2022   OSA (obstructive sleep apnea) 11/09/2021   Mitral regurgitation 09/07/2021   Dilated aortic root (HCC) 09/07/2021   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/2020   Unilateral primary osteoarthritis, left knee 10/22/2019   Unilateral primary osteoarthritis, right knee 10/22/2019   BMI 50.0-59.9, adult (HCC) 10/22/2019   Grieving 06/10/2019   Pruritus 06/10/2019   Lower extremity edema 11/02/2018   Post-operative state 10/10/2017   Symptomatic anemia 05/20/2017   Arthralgia of both knees 06/27/2015   BACK STRAIN, LUMBAR 03/16/2010   KNEE PAIN, BILATERAL 06/30/2009   OBESITY 04/13/2009   Edema 04/13/2009   FIBROADENOMA, BREAST 04/20/2008   HYPERTENSION, BENIGN ESSENTIAL 04/14/2008   Allergic rhinitis 04/14/2008   Asthma 04/14/2008    Conditions to be addressed/monitored per PCP order:  Depression  Care Plan : LCSW Plan of Care  Updates made by Caroline Bryant, LCSW since 07/27/2022 12:00 AM     Problem: Depression Identification (Depression)      Long-Range Goal: Depressive Symptoms Identified  Start Date: 05/02/2022  Priority: High  Note:   Priority: High  Timeframe:  Long-Range Goal Priority:  High Start Date: 05/02/22              Expected End Date:  ongoing                     Follow Up Date--08/16/22 at 36  - check out bereavement counseling and long term counseling options -resources provided for you to write down due to low usage of email - keep 90 percent of counseling appointments - schedule initial counseling appointment    Why is this important?             Beating grief and depression may take some time.            If you don't feel better right away, don't give up on your treatment plan.    Current barriers:   Chronic Mental Health needs related to depression  and grief from losing her daughter in April of 2023. Patient requires Support, Education, Resources, Referrals, Advocacy, and Care Coordination, in order to meet Unmet Mental Health Needs. Patient will implement clinical interventions discussed today to decrease symptoms of grief and increase knowledge and/or ability of: coping skills. Mental Health Concerns and Social Isolation Patient lacks knowledge of available community counseling agencies and resources.  Clinical Goal(s): verbalize understanding of plan for management of Grief, Depression, and Stress and demonstrate a reduction in symptoms. Patient will connect with a provider for ongoing mental health treatment, increase coping skills, healthy habits, self-management skills, and stress reduction        Clinical Interventions:  Assessed patient's previous and current treatment, coping skills, support system and barriers to care. Patient is experiencing grief due to the loss of a loved one but prefers long term therapy over brief grief counseling. Verbalization of feelings encouraged, motivational interviewing employed Emotional support provided, positive coping strategies explored Self care emphasized Patient is agreeable to referral to [facility] for individual counseling only. She does not want psychiatry. Patient reports significant worsening grief and depression impacting her ability to function appropriately and carry out daily task. LCSW provided education on relaxation techniques such as meditation, deep breathing, massage, grounding exercises or yoga that can activate the body's relaxation response and ease symptoms of stress and anxiety. LCSW ask that when pt is struggling with difficult emotions and racing thoughts that they start this relaxation response process. LCSW provided extensive education on healthy coping skills for anxiety. SW used active and reflective listening, validated patient's feelings/concerns, and provided emotional  support. Patient will work on implementing appropriate self-care habits into their daily routine such as: staying positive, writing a gratitude list, drinking water, staying active around the house, taking their medications as prescribed, combating negative thoughts or emotions and staying connected with their family and friends. Positive reinforcement provided for this decision to work on this. Motivational Interviewing employed Depression screen reviewed  PHQ2/ PHQ9 completed Mindfulness or Relaxation training provided Active listening / Reflection utilized  Advance Care and HCPOA education provided Emotional Support Provided Problem Solving /Task Center strategies reviewed Provided psychoeducation for mental health needs  Provided brief CBT  Reviewed mental health medications and discussed importance of compliance:  Quality of sleep assessed & Sleep Hygiene techniques promoted  Participation in counseling encouraged  Verbalization of feelings encouraged  Suicidal Ideation/Homicidal Ideation assessed: Patient denies SI/HI  Review resources, discussed options and provided patient information about  Mental Health Resources Inter-disciplinary care team  collaboration (see longitudinal plan of care) UPDATE- Pt reports that she is still having issues with transportation and has planned to get that sorted out this week. She request a follow up appointment with [facility] LCSW 2 weeks from now so that she can set up her intake appointment once she knows her transportation is secure. [facility] LCSW provided brief coping skill education. UPDATE- [facility] LCSW was informed that patient had a family emergency and she is currently in [place] unable to get to her intake appointment at [facility] this week. Patient was provided crisis support resources and [facility] contact information again. Patient is agreeable to contact agency and [facility] LCSW sent in basket message to [facility] assistant. [facility] LCSW will follow up again next month to ensure  that patient's needs were addressed. Update- Patient has been unable to reach [facility]  to reschedule missed intake appt  due to a family emergency. Patient has called several times and has not heard back. [facility] LCSW informed [facility] team by secure instant message. 07/27/22 Update- Patient was able to schedule an intake appointment with [facility] for next year. Patient became tearful over the phone due to experiencing so many stressors at one time regarding her mental health, grief and finances. Patient desire to gain [facility] BSW financial support and resource education. [facility] LCSW updated [facility] BSW and placed patient on the schedule. Patient was educated on [facility] walk in hours and she has agreed to go there next week instead of waiting for her initial intake appointment next year.  Patient Goals/Self-Care Activities: Over the next 120 days Attend scheduled medical appointments Utilize healthy coping skills and supportive resources discussed Contact PCP with any questions or concerns Keep 90 percent of counseling appointments Call your insurance provider for more information about your Enhanced Benefits  Check out counseling resources provided  Begin personal counseling with LCSW, to reduce and manage symptoms of Grief, Depression and Stress, until well-established with mental health provider Accept all calls from representative at [email or web address] as an effort to establish ongoing mental health counseling and supportive services.  Incorporate into daily practice - relaxation techniques, deep breathing exercises, and mindfulness meditation strategies. Talk about feelings with friends, family members, spiritual advisor, etc. Contact LCSW directly [phone]), if you have questions, need assistance, or if additional social work needs are identified between now and our next scheduled telephone outreach call. Call 988 for mental health hotline/crisis line if needed (24/7 available) Try techniques to reduce symptoms of  anxiety/negative thinking (deep breathing, distraction, positive self talk, etc)  - develop a personal safety plan - develop a plan to deal with triggers like holidays, anniversaries - exercise at least 2 to 3 times per week - have a plan for how to handle bad days - journal feelings and what helps to feel better or worse - spend time or talk with others at least 2 to 3 times per week - watch for early signs of feeling worse - begin personal counseling - call and visit an old friend - check out volunteer opportunities - join a support group - laugh; watch a funny movie or comedian - learn and use visualization or guided imagery - perform a random act of kindness - practice relaxation or meditation daily - start or continue a personal journal - practice positive thinking and self-talk -continue with compliance of taking medication  -identify current effective and ineffective coping strategies.  -implement positive self-talk in care to increase self-esteem, confidence and feelings of control.  -consider alternative and complementary therapy approaches  such as meditation, mindfulness or yoga.  -journaling, prayer, worship services, meditation or pastoral counseling.  -increase participation in pleasurable group activities such as hobbies, singing, sports or volunteering).  -consider the use of meditative movement therapy such as tai chi, yoga or qigong.  -start a regular daily exercise program based on tolerance, ability and patient choice to support positive thinking and activity     Managing Loss, Adult People experience loss in many different ways throughout their lives. Events such as moving, changing jobs, and losing friends can create a sense of loss. The loss may be as serious as a major health change, divorce, death of a pet, or death of a loved one. All of these types of loss are likely to create a physical and emotional reaction known as grief. Grief is the result of a major change  or an absence of something or someone that you count on. Grief is a normal reaction to loss. A variety of factors can affect your grieving experience, including: The nature of your loss. Your relationship to what or whom you lost. Your understanding of grief and how to manage it. Your support system. How to manage lifestyle changes Keep to your normal routine as much as possible. If you have trouble focusing or doing normal activities, it is acceptable to take some time away from your normal routine. Spend time with friends and loved ones. Eat a healthy diet, get plenty of sleep, and rest when you feel tired. How to recognize changes  The way that you deal with your grief will affect your ability to function as you normally do. When grieving, you may experience these changes: Numbness, shock, sadness, anxiety, anger, denial, and guilt. Thoughts about death. Unexpected crying. A physical sensation of emptiness in your stomach. Problems sleeping and eating. Tiredness (fatigue). Loss of interest in normal activities. Dreaming about or imagining seeing the person who died. A need to remember what or whom you lost. Difficulty thinking about anything other than your loss for a period of time. Relief. If you have been expecting the loss for a while, you may feel a sense of relief when it happens. Follow these instructions at home:    Activity Express your feelings in healthy ways, such as: Talking with others about your loss. It may be helpful to find others who have had a similar loss, such as a support group. Writing down your feelings in a journal. Doing physical activities to release stress and emotional energy. Doing creative activities like painting, sculpting, or playing or listening to music. Practicing resilience. This is the ability to recover and adjust after facing challenges. Reading some resources that encourage resilience may help you to learn ways to practice those  behaviors. General instructions Be patient with yourself and others. Allow the grieving process to happen, and remember that grieving takes time. It is likely that you may never feel completely done with some grief. You may find a way to move on while still cherishing memories and feelings about your loss. Accepting your loss is a process. It can take months or longer to adjust. Keep all follow-up visits as told by your health care provider. This is important. Where to find support To get support for managing loss: Ask your health care provider for help and recommendations, such as grief counseling or therapy. Think about joining a support group for people who are managing a loss. Where to find more information You can find more information about managing loss from: American Society  of Clinical Oncology: www.cancer.net American Psychological Association: [employer] Contact a health care provider if: Your grief is extreme and keeps getting worse. You have ongoing grief that does not improve. Your body shows symptoms of grief, such as illness. You feel depressed, anxious, or lonely. Get help right away if: You have thoughts about hurting yourself or others. If you ever feel like you may hurt yourself or others, or have thoughts about taking your own life, get help right away. You can go to your nearest emergency department or call: Your local emergency services (911 in the [place] A suicide crisis helpline, such as the National Suicide Prevention Lifeline at 2246729784. This is open 24 hours a day. Summary Grief is the result of a major change or an absence of someone or something that you count on. Grief is a normal reaction to loss. The depth of grief and the period of recovery depend on the type of loss and your ability to adjust to the change and process your feelings. Processing grief requires patience and a willingness to accept your feelings and talk about your loss with people  who are supportive. It is important to find resources that work for you and to realize that people experience grief differently. There is not one grieving process that works for everyone in the same way. Be aware that when grief becomes extreme, it can lead to more severe issues like isolation, depression, anxiety, or suicidal thoughts. Talk with your health care provider if you have any of these issues. This information is not intended to replace advice given to you by your health care provider. Make sure you discuss any questions you have with your health care provider. Document Revised: 10/10/2018 Document Reviewed: 12/20/2016 Elsevier Patient Education  2020 [employer].  Help for Managing Grief   When you are experiencing grief, many resources and support are available to help you. You are not alone.    Grief Share (https://www.SunglassSpecialist.gl):    [employer] of [address] [place], [place]      [employer]  [phone] [address] [place], [place]     [facility] [address] [place], [place]      [facility] [address] [place], [place]  [phone]   [facility] [address] [place], [place] [phone]   [facility] [address] [place], [place]     [phone]   [facility] [address] [place], [place]     [phone]   If a Hospice or Palliative Care team has been involved in the care of your loved one, you may reach out to your contact there or locally, you may reach out to [facility]:    [facility] [place] [facility]   [address] [place], [place] [address] [phone]   [facility]  [facility] [place], [place]  [address] [place], [place], [place] [address] Phone: [phone]  If you are experiencing a Mental Health or Behavioral Health Crisis or  need someone to talk to, please call the Suicide and Crisis Lifeline: 988        Follow up:  Patient agrees to Care Plan and Follow-up.  Plan: The Managed Medicaid care management team will reach out to the patient again over the next 30 days.  Date/time of next scheduled Social Work care management/care coordination outreach:  08/16/22  at 230  Dickie La, BSW, MSW, [employer] Managed Medicaid LCSW [facility]  [facility] [email or web address] Phone: [phone]

## 2022-07-27 NOTE — Patient Instructions (Signed)
Visit Information  Ms. Weber was given information about Medicaid Managed Care team care coordination services as a part of their [place] Medicaid benefit. Ladaja Yusupov Mcnease verbally consented to engagement with the [facility] Managed Care team.   If you are experiencing a medical emergency, please call 911 or report to your local emergency department or urgent care.   If you have a non-emergency medical problem during routine business hours, please contact your provider's office and ask to speak with a nurse.   For questions related to your [facility], please call: [phone] or visit the homepage here: [email or web address]  If you would like to schedule transportation through your [facility], please call the following number at least 2 days in advance of your appointment: [phone]   Rides for urgent appointments can also be made after hours by calling Member Services.  Call the [place] at [phone], at any time, 24 hours a day, 7 days a week. If you are in danger or need immediate medical attention call 911.  If you would like help to quit smoking, call 1-800-QUIT-NOW 660-874-5254) OR Espaol: 1-855-Djelo-Ya (7-035-009-3818) o para ms informacin haga clic aqu or Text READY to 200-400 to register via text   Following is a copy of your plan of care:  Care Plan : LCSW Plan of Care  Updates made by Greg Cutter, LCSW since 07/27/2022 12:00 AM     Problem: Depression Identification (Depression)      Long-Range Goal: Depressive Symptoms Identified   Start Date: 05/02/2022  Priority: High  Note:   Priority: High  Timeframe:  Long-Range Goal Priority:  High Start Date: 05/02/22              Expected End Date:  ongoing                     Follow Up Date--08/16/22 at 230  - check out bereavement counseling and long  term counseling options -resources provided for you to write down due to low usage of email - keep 90 percent of counseling appointments - schedule initial counseling appointment    Why is this important?             Beating grief and depression may take some time.            If you don't feel better right away, don't give up on your treatment plan.    Current barriers:   Chronic Mental Health needs related to depression and grief from losing her daughter in April of 2023. Patient requires Support, Education, Resources, Referrals, Advocacy, and Care Coordination, in order to meet Unmet Mental Health Needs. Patient will implement clinical interventions discussed today to decrease symptoms of grief and increase knowledge and/or ability of: coping skills. Mental Health Concerns and Social Isolation Patient lacks knowledge of available community counseling agencies and resources.  Clinical Goal(s): verbalize understanding of plan for management of Grief, Depression, and Stress and demonstrate a reduction in symptoms. Patient will connect with a provider for ongoing mental health treatment, increase coping skills, healthy habits, self-management skills, and stress reduction        Patient Goals/Self-Care Activities: Over the next 120 days Attend scheduled medical appointments Utilize healthy coping skills and supportive resources discussed Contact PCP with any questions or concerns Keep 90 percent of counseling appointments Call your insurance provider for more information about your Enhanced Benefits  Check out counseling resources  provided  Begin personal counseling with LCSW, to reduce and manage symptoms of Grief, Depression and Stress, until well-established with mental health provider Accept all calls from representative at [place] as an effort to establish ongoing mental health counseling and supportive services.  Incorporate into daily practice - relaxation techniques, deep breathing  exercises, and mindfulness meditation strategies. Talk about feelings with friends, family members, spiritual advisor, etc. Contact LCSW directly [phone]), if you have questions, need assistance, or if additional social work needs are identified between now and our next scheduled telephone outreach call. Call 988 for mental health hotline/crisis line if needed (24/7 available) Try techniques to reduce symptoms of anxiety/negative thinking (deep breathing, distraction, positive self talk, etc)  - develop a personal safety plan - develop a plan to deal with triggers like holidays, anniversaries - exercise at least 2 to 3 times per week - have a plan for how to handle bad days - journal feelings and what helps to feel better or worse - spend time or talk with others at least 2 to 3 times per week - watch for early signs of feeling worse - begin personal counseling - call and visit an old friend - check out volunteer opportunities - join a support group - laugh; watch a funny movie or comedian - learn and use visualization or guided imagery - perform a random act of kindness - practice relaxation or meditation daily - start or continue a personal journal - practice positive thinking and self-talk -continue with compliance of taking medication  -identify current effective and ineffective coping strategies.  -implement positive self-talk in care to increase self-esteem, confidence and feelings of control.  -consider alternative and complementary therapy approaches such as meditation, mindfulness or yoga.  -journaling, prayer, worship services, meditation or pastoral counseling.  -increase participation in pleasurable group activities such as hobbies, singing, sports or volunteering).  -consider the use of meditative movement therapy such as tai chi, yoga or qigong.  -start a regular daily exercise program based on tolerance, ability and patient choice to support positive thinking and  activity     Managing Loss, Adult People experience loss in many different ways throughout their lives. Events such as moving, changing jobs, and losing friends can create a sense of loss. The loss may be as serious as a major health change, divorce, death of a pet, or death of a loved one. All of these types of loss are likely to create a physical and emotional reaction known as grief. Grief is the result of a major change or an absence of something or someone that you count on. Grief is a normal reaction to loss. A variety of factors can affect your grieving experience, including: The nature of your loss. Your relationship to what or whom you lost. Your understanding of grief and how to manage it. Your support system. How to manage lifestyle changes Keep to your normal routine as much as possible. If you have trouble focusing or doing normal activities, it is acceptable to take some time away from your normal routine. Spend time with friends and loved ones. Eat a healthy diet, get plenty of sleep, and rest when you feel tired. How to recognize changes  The way that you deal with your grief will affect your ability to function as you normally do. When grieving, you may experience these changes: Numbness, shock, sadness, anxiety, anger, denial, and guilt. Thoughts about death. Unexpected crying. A physical sensation of emptiness in your stomach. Problems sleeping and eating.  Tiredness (fatigue). Loss of interest in normal activities. Dreaming about or imagining seeing the person who died. A need to remember what or whom you lost. Difficulty thinking about anything other than your loss for a period of time. Relief. If you have been expecting the loss for a while, you may feel a sense of relief when it happens. Follow these instructions at home:    Activity Express your feelings in healthy ways, such as: Talking with others about your loss. It may be helpful to find others who have had  a similar loss, such as a support group. Writing down your feelings in a journal. Doing physical activities to release stress and emotional energy. Doing creative activities like painting, sculpting, or playing or listening to music. Practicing resilience. This is the ability to recover and adjust after facing challenges. Reading some resources that encourage resilience may help you to learn ways to practice those behaviors. General instructions Be patient with yourself and others. Allow the grieving process to happen, and remember that grieving takes time. It is likely that you may never feel completely done with some grief. You may find a way to move on while still cherishing memories and feelings about your loss. Accepting your loss is a process. It can take months or longer to adjust. Keep all follow-up visits as told by your health care provider. This is important. Where to find support To get support for managing loss: Ask your health care provider for help and recommendations, such as grief counseling or therapy. Think about joining a support group for people who are managing a loss. Where to find more information You can find more information about managing loss from: American Society of Clinical Oncology: www.cancer.net American Psychological Association: TVStereos.ch Contact a health care provider if: Your grief is extreme and keeps getting worse. You have ongoing grief that does not improve. Your body shows symptoms of grief, such as illness. You feel depressed, anxious, or lonely. Get help right away if: You have thoughts about hurting yourself or others. If you ever feel like you may hurt yourself or others, or have thoughts about taking your own life, get help right away. You can go to your nearest emergency department or call: Your local emergency services (911 in the U.S.). A suicide crisis helpline, such as the [place] at 4185129674. This is  open 24 hours a day. Summary Grief is the result of a major change or an absence of someone or something that you count on. Grief is a normal reaction to loss. The depth of grief and the period of recovery depend on the type of loss and your ability to adjust to the change and process your feelings. Processing grief requires patience and a willingness to accept your feelings and talk about your loss with people who are supportive. It is important to find resources that work for you and to realize that people experience grief differently. There is not one grieving process that works for everyone in the same way. Be aware that when grief becomes extreme, it can lead to more severe issues like isolation, depression, anxiety, or suicidal thoughts. Talk with your health care provider if you have any of these issues. This information is not intended to replace advice given to you by your health care provider. Make sure you discuss any questions you have with your health care provider. Document Revised: 10/10/2018 Document Reviewed: 12/20/2016 Elsevier Patient Education  [place] for Managing Grief  When you are experiencing grief, many resources and support are available to help you. You are not alone.    Grief Share (https://www.https://jacobson-moore.net/):    [employer] [facility] [place], [place]  [phone] [address], [place], [place], [place]   [place], [place]   [facility] [address] [place], [place]   [place], [place]   If a Hospice or [place] team has been involved in the care of your loved one, you may reach out to your contact  there or locally, you may reach out to [place]:    Hospice and [place] and [facility]   [place], [place] [address] [address] in [place], [place]  [place], [place], [place] [address] Phone: [phone]  If you are experiencing a Mental Health or [place] or need someone to talk to, please call the Suicide and Crisis Lifeline: 37

## 2022-08-01 DIAGNOSIS — F321 Major depressive disorder, single episode, moderate: Secondary | ICD-10-CM | POA: Diagnosis not present

## 2022-08-02 DIAGNOSIS — F321 Major depressive disorder, single episode, moderate: Secondary | ICD-10-CM | POA: Diagnosis not present

## 2022-08-06 ENCOUNTER — Other Ambulatory Visit: Payer: Self-pay

## 2022-08-07 ENCOUNTER — Other Ambulatory Visit: Payer: Self-pay

## 2022-08-07 ENCOUNTER — Telehealth: Payer: Self-pay

## 2022-08-07 DIAGNOSIS — I77810 Thoracic aortic ectasia: Secondary | ICD-10-CM

## 2022-08-08 ENCOUNTER — Other Ambulatory Visit: Payer: Medicaid Other

## 2022-08-08 DIAGNOSIS — F321 Major depressive disorder, single episode, moderate: Secondary | ICD-10-CM | POA: Diagnosis not present

## 2022-08-08 NOTE — Patient Outreach (Signed)
Medicaid Managed Care Social Work Note  08/08/2022 Name:  Caroline Ryan MRN:  [id number] DOB:  12-08-1965  Caroline Ryan is an 56 y.o. year old female who is a primary patient of Fenton Foy, NP.  The Medicaid Managed Care Coordination team was consulted for assistance with:  Community Resources   [name] was given information about Medicaid Managed Care Coordination team services today. Caroline Stalls Reim Patient agreed to services and verbal consent obtained.  Engaged with patient  for by telephone forfollow up visit in response to referral for case management and/or care coordination services.   Assessments/Interventions:  Review of past medical history, allergies, medications, health status, including review of consultants reports, laboratory and other test data, was performed as part of comprehensive evaluation and provision of chronic care management services.  SDOH: (Social Determinant of Health) assessments and interventions performed: SDOH Interventions    Flowsheet Row Patient Outreach Telephone from 07/27/2022 in [place] Telephone from 07/24/2022 in [place] Patient Outreach Telephone from 05/21/2022 in [place] Patient Outreach Telephone from 05/02/2022 in [place] Patient Outreach Telephone from 03/09/2022 in [place] Office Visit from 08/09/2020 in [facility] [employer] [facility] [employer] [facility]  SDOH Interventions        Food Insecurity Interventions -- -- -- -- Other (Comment)  [BSW referral for assistance with transportation to food pantries and non medical errands] Information systems manager ([facility] only)  Housing Interventions -- -- -- -- Intervention Not Indicated --  Transportation Interventions -- -- -- -- Other (Comment)  [UHC transportion  [phone]] [employer]  Depression Interventions/Treatment  Referral to Psychiatry -- Counseling Counseling -- --  Stress Interventions Provide Counseling, Offered [employer] Resources Provide Counseling, Offered [employer] Resources Provide Counseling, [place] Resources, Provide Counseling -- --       Advanced Directives Status:  Not addressed in this encounter.  Care Plan                 No Known Allergies  Medications Reviewed Today     Reviewed by Greg Cutter, LCSW (Social Worker) on 06/04/22 at 2  Med List Status: <None>   Medication Order Taking? Sig Documenting Provider Last Dose Status Informant  acetaminophen (TYLENOL) 500 MG tablet [id number] No Take 2 tablets (1,000 mg total) by mouth 2 (two) times daily as needed.  Patient not taking: Reported on 05/14/2022   [name], FNP Not Taking Active   albuterol (VENTOLIN HFA) 108 (90 Base) MCG/ACT inhaler [id number] No Inhale 1 puff every 6 hours as needed for shortness of breath Bo Merino I, NP Taking Active   aspirin 81 MG chewable tablet [id number] No Chew by mouth daily. [provider] Taking Active   carvedilol (COREG) 25 MG tablet [id number] No TAKE 1 TABLET (25 MG TOTAL) BY MOUTH 2 (TWO) TIMES DAILY. [name], MD Taking Active   cetirizine (ZYRTEC) 10 MG tablet [id number] No Take 1 tablet (10 mg total) by mouth daily. Bo Merino I, NP Taking Active   cloNIDine (CATAPRES - DOSED IN MG/24 HR) 0.1 mg/24hr patch [id number] No Place 1 patch (0.1 mg total) onto the skin once a week. [name], MD Taking Active   cyclobenzaprine (FLEXERIL) 5 MG tablet [id number] No Take 1 tablet (5 mg total) by mouth 3 (three) times  daily as needed for muscle spasms. Fenton Foy, NP Taking Active   diclofenac Sodium (VOLTAREN) 1 % GEL [id number] No Apply 4 g topically 4 (four) times daily. Bo Merino I, NP  Taking Active   docusate sodium (COLACE) 100 MG capsule [id number] No Take 1 capsule (100 mg total) by mouth 2 (two) times daily as needed. Bo Merino I, NP Taking Active   Efinaconazole 10 % SOLN [id number]  Apply 1 Application topically daily. Fenton Foy, NP  Active   furosemide (LASIX) 20 MG tablet [id number] No Take 1 tablet (20 mg total) by mouth daily. [name], MD Taking Expired 04/24/22 2359   hydrocortisone 2.5 % cream [id number] No Apply topically 2 (two) times daily. Bo Merino I, NP Taking Active   hydrOXYzine (ATARAX) 25 MG tablet [id number] No Take 1 tablet (25 mg total) by mouth 3 (three) times daily as needed. Bo Merino I, NP Taking Active   ibuprofen (ADVIL) 800 MG tablet [id number] No Take 1 tablet (800 mg total) by mouth 3 (three) times daily. [name], MD Taking Active   linaclotide Rolan Lipa) 72 MCG capsule [id number] No Take 1 capsule (72 mcg total) by mouth daily before breakfast. Bo Merino I, NP Taking Active   methocarbamol (ROBAXIN) 750 MG tablet [id number]  Take 1 tablet (750 mg total) by mouth 3 (three) times daily. Fenton Foy, NP  Active   montelukast (SINGULAIR) 10 MG tablet [id number] No TAKE 1 TABLET (10 MG TOTAL) BY MOUTH AT BEDTIME. [name], FNP Taking Expired 04/24/22 2359   NONFORMULARY OR COMPOUNDED ITEM [id number] No Diclofenac 3%, Gabapentin 5%, Lidocaine 5%, Menthol 1%.  Apply 1-2 pumps three to four times per day as needed  Patient not taking: Reported on 03/23/2022   [name], MD Not Taking Active   olmesartan (BENICAR) 40 MG tablet [id number] No TAKE 1 TABLET (40 MG TOTAL) BY MOUTH DAILY. [name], MD Taking Active   spironolactone (ALDACTONE) 100 MG tablet [id number] No TAKE 1 TABLET (100 MG TOTAL) BY MOUTH DAILY. 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   Unilateral primary osteoarthritis, left knee 10/22/2019   Unilateral primary osteoarthritis, right knee 10/22/2019   BMI 50.0-59.9, adult ([place]) 10/22/2019   Grieving 06/10/2019   Pruritus 06/10/2019   Lower extremity edema 11/02/2018   Post-operative state 10/10/2017   Symptomatic anemia 05/20/2017   Arthralgia of both knees 06/27/2015   BACK STRAIN, LUMBAR 03/16/2010   KNEE PAIN, BILATERAL 06/30/2009   OBESITY 04/13/2009   Edema 04/13/2009   FIBROADENOMA, BREAST 04/20/2008   HYPERTENSION, BENIGN ESSENTIAL 04/14/2008   Allergic rhinitis 04/14/2008   Asthma 04/14/2008  BSW completed a telephone outreach with patient. She states she is behind on her rent but her rent is income based and they are working with her. She is still out of work and waiting on disability. Her children are helping her with toiletries and other household items. BSW will send patient a list  of resources that may be able to assist her.  Conditions to be addressed/monitored per PCP order:   Community Resources  There are no care plans that you recently modified to display for this patient.   Follow up:  Patient agrees to Care Plan and Follow-up.  Plan: The Managed Medicaid care management team will reach out to the patient again over the next 14 days.  Date/time of next scheduled Social Work care management/care coordination outreach:  08/29/22  Mickel Fuchs, Arita Miss, [place] Medicaid Team  [phone]

## 2022-08-08 NOTE — Patient Instructions (Signed)
Visit Information  Ms. Romaniello was given information about Medicaid Managed Care team care coordination services as a part of their [place] Medicaid benefit. Allison Deshotels [place]on verbally consented to engagement with the [facility] Managed Care team.   If you are experiencing a medical emergency, please call 911 or report to your local emergency department or urgent care.   If you have a non-emergency medical problem during routine business hours, please contact your provider's office and ask to speak with a nurse.   For questions related to your [facility], please call: [phone] or visit the homepage here: [email or web address]  If you would like to schedule transportation through your [facility], please call the following number at least 2 days in advance of your appointment: [phone]   Rides for urgent appointments can also be made after hours by calling Member Services.  Call the [place] at [phone], at any time, 24 hours a day, 7 days a week. If you are in danger or need immediate medical attention call 911.  If you would like help to quit smoking, call 1-800-QUIT-NOW 534-625-6357) OR Espaol: 1-855-Djelo-Ya (8-453-646-8032) o para ms informacin haga clic aqu or Text READY to 200-400 to register via text  Ms. Lantz - following are the goals we discussed in your visit today:   Goals Addressed   None      Social Worker will follow up on 08/29/22.   Mickel Fuchs, BSW, [place] Managed Medicaid Team  [phone]   Following is a copy of your plan of care:  There are no care plans that you recently modified to display for this patient.

## 2022-08-08 NOTE — Telephone Encounter (Signed)
Spoke with patient to review echo results and advise to repeat it in one year. Patient verbalized understanding and had no further questions.

## 2022-08-09 DIAGNOSIS — F321 Major depressive disorder, single episode, moderate: Secondary | ICD-10-CM | POA: Diagnosis not present

## 2022-08-10 ENCOUNTER — Other Ambulatory Visit: Payer: Self-pay

## 2022-08-14 ENCOUNTER — Other Ambulatory Visit: Payer: Self-pay

## 2022-08-16 ENCOUNTER — Other Ambulatory Visit: Payer: Medicaid Other | Admitting: Licensed Clinical Social Worker

## 2022-08-16 DIAGNOSIS — F321 Major depressive disorder, single episode, moderate: Secondary | ICD-10-CM | POA: Diagnosis not present

## 2022-08-16 NOTE — Patient Instructions (Signed)
Nolon Bussing ,   The [facility] Managed Care Team is available to provide assistance to you with your healthcare needs at no cost and as a benefit of your [facility] Health plan. I'm sorry I was unable to reach you today for our scheduled appointment. Our care guide will call you to reschedule our telephone appointment. Please call me at the number below. I am available to be of assistance to you regarding your healthcare needs. .   Thank you,   Eula Fried, BSW, MSW, LCSW Managed Medicaid LCSW [email or web address] Phone: [phone]

## 2022-08-16 NOTE — Patient Outreach (Signed)
Medicaid Managed Care   Unsuccessful Attempt Note   08/16/2022 Name: Caroline Ryan MRN: [id number] DOB: March 23, 1966  Referred by: Ivonne Andrew, NP Reason for referral : No chief complaint on file.   An unsuccessful telephone outreach was attempted today. The patient was referred to the case management team for assistance with care management and care coordination.    Follow Up Plan: A HIPAA compliant phone message was left for the patient providing contact information and requesting a return call.   Dickie La, BSW, MSW, [employer] Managed Medicaid LCSW [facility]  [facility] [email or web address] Phone: [phone]

## 2022-08-17 ENCOUNTER — Other Ambulatory Visit: Payer: Self-pay

## 2022-08-17 ENCOUNTER — Other Ambulatory Visit (HOSPITAL_BASED_OUTPATIENT_CLINIC_OR_DEPARTMENT_OTHER): Payer: Self-pay

## 2022-08-17 DIAGNOSIS — F321 Major depressive disorder, single episode, moderate: Secondary | ICD-10-CM | POA: Diagnosis not present

## 2022-08-21 ENCOUNTER — Encounter (HOSPITAL_COMMUNITY): Payer: Self-pay

## 2022-08-21 ENCOUNTER — Ambulatory Visit (INDEPENDENT_AMBULATORY_CARE_PROVIDER_SITE_OTHER): Payer: Medicaid Other | Admitting: Mental Health

## 2022-08-21 DIAGNOSIS — F322 Major depressive disorder, single episode, severe without psychotic features: Secondary | ICD-10-CM | POA: Diagnosis not present
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
## 2022-08-23 ENCOUNTER — Other Ambulatory Visit: Payer: Medicaid Other | Admitting: Licensed Clinical Social Worker

## 2022-08-23 NOTE — Patient Outreach (Signed)
  Medicaid Managed Care   Unsuccessful Attempt Note   08/23/2022 Name: Caroline Ryan [facility] MRN: [id number] DOB: 1966/01/06  Referred by: Fenton Foy, NP Reason for referral : No chief complaint on file.   An unsuccessful telephone outreach was attempted today. The patient was referred to the case management team for assistance with care management and care coordination.    Follow Up Plan: A HIPAA compliant phone message was left for the patient providing contact information and requesting a return call.   Eula Fried, BSW, MSW, [employer] Managed Medicaid LCSW [email or web address] Phone: [phone]

## 2022-08-24 DIAGNOSIS — G4733 Obstructive sleep apnea (adult) (pediatric): Secondary | ICD-10-CM | POA: Diagnosis not present

## 2022-08-27 ENCOUNTER — Telehealth: Payer: Self-pay

## 2022-08-27 NOTE — Telephone Encounter (Signed)
.   Medicaid Managed Care Note  08/27/2022 Name: Caroline Ryan MRN: [id number] DOB: 07-20-1966  Caroline Ryan is a 57 y.o. year old female who is a primary care patient of Fenton Foy, NP and is actively engaged with the care management team. I reached out to Caroline Bussing by phone today to assist with re-scheduling a follow up visit with the Licensed Clinical Education officer, museum.   Follow up plan: Unsuccessful telephone outreach attempt made. A HIPAA compliant phone message was left for the patient providing contact information and requesting a return call.  We have been unable to make contact with the patient for follow up. The care management team is available to follow up with the patient after provider conversation with the patient regarding recommendation for care management engagement and subsequent re-referral to the care management team.   [place], [place]

## 2022-08-30 ENCOUNTER — Other Ambulatory Visit: Payer: Medicaid Other

## 2022-08-30 NOTE — Patient Outreach (Signed)
Medicaid Managed Care Social Work Note  08/30/2022 Name:  Caroline Ryan MRN:  [id number] DOB:  [birth year]  Caroline Ryan is an 57 y.o. year old female who is a primary patient of Fenton Foy, NP.  The Medicaid Managed Care Coordination team was consulted for assistance with:  Community Resources   [name] was given information about Medicaid Managed Care Coordination team services today. Caroline Ryan Caroline Ryan Patient agreed to services and verbal consent obtained.  Engaged with patient  for by telephone forfollow up visit in response to referral for case management and/or care coordination services.   Assessments/Interventions:  Review of past medical history, allergies, medications, health status, including review of consultants reports, laboratory and other test data, was performed as part of comprehensive evaluation and provision of chronic care management services.  SDOH: (Social Determinant of Health) assessments and interventions performed: SDOH Interventions    Flowsheet Row Patient Outreach Telephone from 07/27/2022 in [place] Telephone from 07/24/2022 in [place] Patient Outreach Telephone from 05/21/2022 in [place] Patient Outreach Telephone from 05/02/2022 in [place] Patient Outreach Telephone from 03/09/2022 in [place] Office Visit from 08/09/2020 in [facility] [employer] [facility]  SDOH Interventions        Food Insecurity Interventions -- -- -- -- Other (Comment)  [BSW referral for assistance with transportation to food pantries and non medical errands] Information systems manager ([facility] only)  Housing Interventions -- -- -- -- Intervention Not Indicated --  Transportation Interventions -- -- -- -- Other (Comment)  [UHC transportion  [phone]] [employer]  Depression Interventions/Treatment  Referral to Psychiatry -- Counseling Counseling -- --  Stress Interventions Provide Counseling, Offered [employer] Provide Counseling, Offered [employer] Provide Counseling, [place] completed a telephone outreach with patient. She stated she did have her appointment with [facility] and loved it. Her next appointment is scheduled for 09/13/22. Patient states she is trying to eat better and do better so that she can have knee surgery. Patient states no resources are needed at this time. Advanced Directives Status:  Not addressed in this encounter.  Care Plan                 No Known Allergies  Medications Reviewed Today     Reviewed by Greg Cutter, LCSW (Social Worker) on 06/04/22 at 72  Med List Status: <None>   Medication Order Taking? Sig Documenting Provider Last Dose Status Informant  acetaminophen (TYLENOL) 500 MG tablet [id number] No Take 2 tablets (1,000 mg total) by mouth 2 (two) times daily as needed.  Patient not taking: Reported on 05/14/2022   [name], FNP Not Taking Active   albuterol (VENTOLIN HFA) 108 (90 Base) MCG/ACT inhaler [id number] No Inhale 1 puff every 6 hours as needed for shortness of breath Bo Merino I, NP Taking Active   aspirin 81 MG chewable tablet [id number] No Chew by mouth daily. [provider] Taking Active   carvedilol (COREG) 25 MG tablet [id number] No TAKE 1 TABLET (25 MG TOTAL) BY MOUTH 2 (TWO) TIMES DAILY. [name], MD Taking Active   cetirizine (ZYRTEC) 10 MG tablet [id number] No Take 1 tablet (10 mg total) by mouth daily. [name], NP Taking Active  cloNIDine (CATAPRES - DOSED IN MG/24 HR) 0.1 mg/24hr patch [id number] No Place 1 patch (0.1 mg total) onto the skin once a week. [name], MD Taking  Active   cyclobenzaprine (FLEXERIL) 5 MG tablet [id number] No Take 1 tablet (5 mg total) by mouth 3 (three) times daily as needed for muscle spasms. Fenton Foy, NP Taking Active   diclofenac Sodium (VOLTAREN) 1 % GEL [id number] No Apply 4 g topically 4 (four) times daily. Bo Merino I, NP Taking Active   docusate sodium (COLACE) 100 MG capsule [id number] No Take 1 capsule (100 mg total) by mouth 2 (two) times daily as needed. Bo Merino I, NP Taking Active   Efinaconazole 10 % SOLN [id number]  Apply 1 Application topically daily. Fenton Foy, NP  Active   furosemide (LASIX) 20 MG tablet [id number] No Take 1 tablet (20 mg total) by mouth daily. [name], MD Taking Expired 04/24/22 2359   hydrocortisone 2.5 % cream [id number] No Apply topically 2 (two) times daily. Bo Merino I, NP Taking Active   hydrOXYzine (ATARAX) 25 MG tablet [id number] No Take 1 tablet (25 mg total) by mouth 3 (three) times daily as needed. Bo Merino I, NP Taking Active   ibuprofen (ADVIL) 800 MG tablet [id number] No Take 1 tablet (800 mg total) by mouth 3 (three) times daily. [name], MD Taking Active   linaclotide Rolan Lipa) 72 MCG capsule [id number] No Take 1 capsule (72 mcg total) by mouth daily before breakfast. Bo Merino I, NP Taking Active   methocarbamol (ROBAXIN) 750 MG tablet [id number]  Take 1 tablet (750 mg total) by mouth 3 (three) times daily. Fenton Foy, NP  Active   montelukast (SINGULAIR) 10 MG tablet [id number] No TAKE 1 TABLET (10 MG TOTAL) BY MOUTH AT BEDTIME. [name], FNP Taking Expired 04/24/22 2359   NONFORMULARY OR COMPOUNDED ITEM [id number] No Diclofenac 3%, Gabapentin 5%, Lidocaine 5%, Menthol 1%.  Apply 1-2 pumps three to four times per day as needed  Patient not taking: Reported on 03/23/2022   [name], MD Not Taking Active   olmesartan (BENICAR) 40 MG tablet [id number] No TAKE 1 TABLET (40 MG TOTAL) BY MOUTH DAILY. [name], MD Taking Active   spironolactone (ALDACTONE) 100 MG tablet [id number] No TAKE 1 TABLET (100 MG TOTAL) BY MOUTH DAILY. [name], MD Taking Active   Triamcinolone Acetonide Orvan Seen) intra-articular injection 32 mg [id number]   [name], MD  Active   Triamcinolone Acetonide (ZILRETTA) intra-articular injection 32 mg [id number]   [name], MD  Active             Patient Active Problem List   Diagnosis Date Noted   Severe major depression, single episode ([place]) 08/21/2022   Chronic pain of both knees 05/16/2022   OSA (obstructive sleep apnea) 11/09/2021   Mitral regurgitation 09/07/2021   Dilated aortic root ([place]) 09/07/2021   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/2020   Unilateral primary osteoarthritis, left knee 10/22/2019   Unilateral primary osteoarthritis, right knee 10/22/2019   BMI 50.0-59.9, adult ([place]) 10/22/2019   Grieving 06/10/2019   Pruritus 06/10/2019   Lower extremity edema 11/02/2018   Post-operative state 10/10/2017   Symptomatic anemia 05/20/2017   Arthralgia of both knees 06/27/2015   BACK STRAIN, LUMBAR 03/16/2010   KNEE PAIN, BILATERAL 06/30/2009   OBESITY 04/13/2009   Edema 04/13/2009   FIBROADENOMA, BREAST 04/20/2008   HYPERTENSION, BENIGN ESSENTIAL 04/14/2008   Allergic rhinitis 04/14/2008  Asthma 04/14/2008    Conditions to be addressed/monitored per PCP order:   community resources  Care Plan : [place] of Care  Updates made by Ethelda Chick since 08/30/2022 12:00 AM     Problem: Health Management needs related to Chronic Pain      Long-Range Goal: Development of Plan of Care to Address Health Management needs related to Chronic Pain   Start Date: 03/09/2022  Expected End Date: 06/07/2022  Priority: High  Note:   Current Barriers:  Chronic Disease Management support and education needs related to chronic pain Financial Constraints.  Transportation barriers  Ms. Vigilante recently experienced the loss of a  loved one and had to travel out of state. She reports the pain in her knees is so bad, that it limits her activity level. Her next appointment with Pain Management is in November. She is working to get disability and needs help with resources to help with utilities. Her therapist will take her to [employer].   RNCM Clinical Goal(s):  Patient will verbalize understanding of plan for management of chronic pain as evidenced by patient reports attend all scheduled medical appointments: BSW on 9/7, LCSW 9/13 and PCP on 9/25 as evidenced by provider documentation        work with Education officer, museum to Musician and financial difficulty related to the management of chronic pain as evidenced by review of EMR and patient or Education officer, museum report     through collaboration with Consulting civil engineer, provider, and care team.   Interventions: Inter-disciplinary care team collaboration (see longitudinal plan of care) Evaluation of current treatment plan related to  self management and patient's adherence to plan as established by provider she BSW completed a telephone outreach with patient. She stated she did have her appointment with [facility] and loved it. Her next appointment is scheduled for 09/13/22. Patient states she is trying to eat better and do better so that she can have knee surgery. Patient states no resources are needed at this time.  Hypertension Interventions:  (Status:  Condition stable.  Not addressed this visit.) Long Term Goal Last practice recorded BP readings:  BP Readings from Last 3 Encounters:  03/12/22 134/86  02/08/22 118/84  01/11/22 (!) 158/98  Most recent eGFR/CrCl:  Lab Results  Component Value Date   EGFR 74 02/08/2022    No components found for: "CRCL"  Evaluation of current treatment plan related to hypertension self management and patient's adherence to plan as established by provider Reviewed medications with patient and discussed importance of  compliance Counseled on the importance of exercise goals with target of 150 minutes per week Discussed plans with patient for ongoing care management follow up and provided patient with direct contact information for care management team Advised patient, providing education and rationale, to monitor blood pressure daily and record, calling PCP for findings outside established parameters Reviewed scheduled/upcoming provider appointments including: 9/7 with BSW, 9/13 with LCSW and 9/25 with PCP   Pain:  (Status: Goal on Track (progressing): YES.) Long Term Goal  Pain assessment performed Medications reviewed Reviewed provider established plan for pain management; Discussed importance of adherence to all scheduled medical appointments; Counseled on the importance of reporting any/all new or changed pain symptoms or management strategies to pain management provider; Advised patient to report to care team affect of pain on daily activities; Discussed use of relaxation techniques and/or diversional activities to assist with pain reduction (distraction, imagery, relaxation, massage, acupressure, TENS, heat, and  cold application; Reviewed with patient prescribed pharmacological and nonpharmacological pain relief strategies; Advised patient to discuss pain management with provider; Referral to LCSW for mental health needs, managing grief Advised patient to contact Dr. Adam Phenix for pain management, Gabapentin was discontinued on 12/26/21 by Dr. Radford Pax due to edema-RNCM collaborated with Dr. Adam Phenix  Patient Goals/Self-Care Activities: Take medications as prescribed   Attend all scheduled provider appointments Call provider office for new concerns or questions  Work with the social worker to address care coordination needs and will continue to work with the clinical team to address health care and disease management related needs       Follow up:  Patient agrees to Care Plan and Follow-up.  Plan:  The Managed Medicaid care management team will reach out to the patient again over the next 30 days.  Date/time of next scheduled Social Work care management/care coordination outreach:  08/31/22  Mickel Fuchs, Arita Miss, [place] Medicaid Team  [phone]

## 2022-08-30 NOTE — Patient Instructions (Signed)
Visit Information  Ms. Foss was given information about Medicaid Managed Care team care coordination services as a part of their [place] Medicaid benefit. Avice Funchess Dunson verbally consented to engagement with the [facility] Managed Care team.   If you are experiencing a medical emergency, please call 911 or report to your local emergency department or urgent care.   If you have a non-emergency medical problem during routine business hours, please contact your provider's office and ask to speak with a nurse.   For questions related to your [facility], please call: [phone] or visit the homepage here: [email or web address]  If you would like to schedule transportation through your [facility], please call the following number at least 2 days in advance of your appointment: [phone]   Rides for urgent appointments can also be made after hours by calling Member Services.  Call the [place] at [phone], at any time, 24 hours a day, 7 days a week. If you are in danger or need immediate medical attention call 911.  If you would like help to quit smoking, call 1-800-QUIT-NOW 254-129-7873) OR Espaol: 1-855-Djelo-Ya (1-975-883-2549) o para ms informacin haga clic aqu or Text READY to 200-400 to register via text  Ms. Casten - following are the goals we discussed in your visit today:   Goals Addressed   None       Social Worker will follow up on 08/31/22.   Mickel Fuchs, BSW, [place]  High Risk Managed Medicaid Team  [phone]   Following is a copy of your plan of care:  Care Plan : [place] of Care  Updates made by Ethelda Chick since 08/30/2022 12:00 AM     Problem: Health Management needs related to Chronic Pain      Long-Range Goal: Development of Plan of  Care to Address Health Management needs related to Chronic Pain   Start Date: 03/09/2022  Expected End Date: 06/07/2022  Priority: High  Note:   Current Barriers:  Chronic Disease Management support and education needs related to chronic pain Financial Constraints.  Transportation barriers  Ms. Villaflor recently experienced the loss of a loved one and had to travel out of state. She reports the pain in her knees is so bad, that it limits her activity level. Her next appointment with Pain Management is in November. She is working to get disability and needs help with resources to help with utilities. Her therapist will take her to [employer].   RNCM Clinical Goal(s):  Patient will verbalize understanding of plan for management of chronic pain as evidenced by patient reports attend all scheduled medical appointments: BSW on 9/7, LCSW 9/13 and PCP on 9/25 as evidenced by provider documentation        work with Education officer, museum to Musician and financial difficulty related to the management of chronic pain as evidenced by review of EMR and patient or Education officer, museum report     through collaboration with Consulting civil engineer, provider, and care team.   Interventions: Inter-disciplinary care team collaboration (see longitudinal plan of care) Evaluation of current treatment plan related to  self management and patient's adherence to plan as established by provider she BSW completed a telephone outreach with patient. She stated she did have her appointment with [facility] and loved it. Her next appointment is scheduled for 09/13/22. Patient states she is trying to eat better  and do better so that she can have knee surgery. Patient states no resources are needed at this time.  Hypertension Interventions:  (Status:  Condition stable.  Not addressed this visit.) Long Term Goal Last practice recorded BP readings:  BP Readings from Last 3 Encounters:  03/12/22 134/86  02/08/22 118/84  01/11/22  (!) 158/98  Most recent eGFR/CrCl:  Lab Results  Component Value Date   EGFR 74 02/08/2022    No components found for: "CRCL"  Evaluation of current treatment plan related to hypertension self management and patient's adherence to plan as established by provider Reviewed medications with patient and discussed importance of compliance Counseled on the importance of exercise goals with target of 150 minutes per week Discussed plans with patient for ongoing care management follow up and provided patient with direct contact information for care management team Advised patient, providing education and rationale, to monitor blood pressure daily and record, calling PCP for findings outside established parameters Reviewed scheduled/upcoming provider appointments including: 9/7 with BSW, 9/13 with LCSW and 9/25 with PCP   Pain:  (Status: Goal on Track (progressing): YES.) Long Term Goal  Pain assessment performed Medications reviewed Reviewed provider established plan for pain management; Discussed importance of adherence to all scheduled medical appointments; Counseled on the importance of reporting any/all new or changed pain symptoms or management strategies to pain management provider; Advised patient to report to care team affect of pain on daily activities; Discussed use of relaxation techniques and/or diversional activities to assist with pain reduction (distraction, imagery, relaxation, massage, acupressure, TENS, heat, and cold application; Reviewed with patient prescribed pharmacological and nonpharmacological pain relief strategies; Advised patient to discuss pain management with provider; Referral to LCSW for mental health needs, managing grief Advised patient to contact Dr. Adam Phenix for pain management, Gabapentin was discontinued on 12/26/21 by Dr. Radford Pax due to edema-RNCM collaborated with Dr. Adam Phenix  Patient Goals/Self-Care Activities: Take medications as prescribed   Attend all  scheduled provider appointments Call provider office for new concerns or questions  Work with the social worker to address care coordination needs and will continue to work with the clinical team to address health care and disease management related needs

## 2022-09-05 ENCOUNTER — Telehealth: Payer: Self-pay | Admitting: Cardiology

## 2022-09-05 NOTE — Telephone Encounter (Signed)
Pt c/o of Chest Pain: STAT if CP now or developed within 24 hours  1. Are you having CP right now? Not at this time- chest pains and pains in both arms   2. Are you experiencing any other symptoms (ex. SOB, nausea, vomiting, sweating)? no  3. How long have you been experiencing CP? About 3 weeks  4. Is your CP continuous or coming and going? Comes and goes  5. Have you taken Nitroglycerin? No- patient wants to be seen ?

## 2022-09-05 NOTE — Telephone Encounter (Signed)
Pt reports that she has been having "shooting" pains in her left arm radiating to the left upper corner of her chest and now it is also happening on the right side.... she says it has been 2-3 weeks and has been getting worse... not related to exertion or eating... she declined an appt today and verbalizes that is anything worsens or changes she will call EMS otherwise she will see an APP Monday 09/10/22.

## 2022-09-07 DIAGNOSIS — I10 Essential (primary) hypertension: Secondary | ICD-10-CM | POA: Diagnosis not present

## 2022-09-07 DIAGNOSIS — G4733 Obstructive sleep apnea (adult) (pediatric): Secondary | ICD-10-CM | POA: Diagnosis not present

## 2022-09-09 NOTE — Progress Notes (Signed)
Office Visit    Patient Name: Caroline Ryan [facility] Date of Encounter: 09/10/2022  PCP:  Fenton Foy, NP   [facility]  Cardiologist:  Fransico Him, MD  Advanced Practice Provider:  No care team member to display Electrophysiologist:  None   HPI    Jennise Both is a 57 y.o. female with a past medical history significant for asthma, anemia, and hypertension.  She has been having issues with lower extremity edema and presents today for follow-up appointment.  She was found to have an abnormal EKG with LVH.  She has been having problems with lower extremity edema since she started with her knee issues.  She was started on Lasix 20 mg daily and has helped with her edema.  She is also on gabapentin and the dose was increased and her lower extremity edema persisted.  When she was seen a few months prior to her last appointment (12/2021) her blood pressure was poorly controlled and her lisinopril was stopped.  She was started on lisinopril 40 mg daily and continue her Lasix 20 mg daily.  Recommended she come off gabapentin which could be contributing to her edema.  2D echocardiogram done 08/03/2021 showed normal LV function.  EF 65% with mildly dilated LV, normal diastolic function, mild to moderate MR and borderline dilated aortic root at 38 mm.  She underwent outpatient sleep study 09/24/2021 which demonstrated moderate obstructive sleep apnea and severe oxygen desaturations as low as 56%.  She underwent CPAP titration 11/09/21 to 16 cm H2O.  Her CPAP was ordered through DME.  Today, she feels that she is waiting for more tubing for her CPAP.  No symptoms.  She does some physical activities such as shopping and walking around.  She also does 3 flights of stairs several times a day.  She states that her prior times, she is seeking to go longer distances.  She states her knees are bone-on-bone so she is working on losing weight she can have a knee replacement surgery.   She states she gets injections in her knees every 3 months.  Her LDL was 119 and we stated that was above goal.  She also states that she has shooting pains in the left side when she bends down to pick something up.  Otherwise they do not occur with activity.  No pain with exertion.  She is waiting for more tubing. No other symptoms. She does some physicial activty, shopping and walking around three flights of stairs. Today, her knees are hurting her and she is using a cane. Bone on bone. Working on [employer]. Off ibuprofen now.She is taking her injections every three months.    Reports no shortness of breath nor dyspnea on exertion. No edema, orthopnea, PND. Reports no palpitations.    Past Medical History    Past Medical History:  Diagnosis Date   Anemia    Ascending aorta dilatation (HCC)    77m by echo 07/2021 and 429mby echo 07/2022   Asthma    Depression    no meds   Eczema    Fibroid    Headache(784.0)    otc prn med   History of blood transfusion    Hypertension    Knee pain, bilateral    arthralgia knee pain bilateral   Lower extremity edema    Mitral regurgitation    Mild to moderate by echo 07/2021   Seasonal allergies    SVD (spontaneous vaginal delivery)  x 3   Vaginal polyp 05/2020   Past Surgical History:  Procedure Laterality Date   ABDOMINAL HYSTERECTOMY     BILATERAL SALPINGECTOMY Bilateral 10/10/2017   Procedure: BILATERAL SALPINGECTOMY;  Surgeon: Chancy Milroy, MD;  Location: [facility];  Service: Gynecology;  Laterality: Bilateral;   BREAST BIOPSY     US guided core 2009   BREAST EXCISIONAL BIOPSY     right 1982 left [place]     bilateral cysts/benign   COLONOSCOPY  10/17/2021   VAGINAL HYSTERECTOMY N/A 10/10/2017   Procedure: HYSTERECTOMY VAGINAL;  Surgeon: Chancy Milroy, MD;  Location: [facility];  Service: Gynecology;  Laterality: N/A;    Allergies  No Known Allergies  EKGs/Labs/Other Studies Reviewed:   The following  studies were reviewed today:  Echocardiogram 07/23/2022 IMPRESSIONS     1. Left ventricular ejection fraction, by estimation, is 55 to 60%. The  left ventricle has normal function. The left ventricle has no regional  wall motion abnormalities. The left ventricular internal cavity size was  mildly dilated. There is mild  concentric left ventricular hypertrophy. Left ventricular diastolic  parameters are consistent with Grade I diastolic dysfunction (impaired  relaxation).   2. Right ventricular systolic function is normal. The right ventricular  size is normal.   3. Left atrial size was mildly dilated.   4. The mitral valve is normal in structure. Mild mitral valve  regurgitation. No evidence of mitral stenosis.   5. The aortic valve is tricuspid. There is mild thickening of the aortic  valve. Aortic valve regurgitation is trivial. No aortic stenosis is  present.   6. Aortic dilatation noted. There is mild dilatation of the ascending  aorta, measuring 40 mm.   7. The inferior vena cava is normal in size with greater than 50%  respiratory variability, suggesting right atrial pressure of 3 mmHg.   Comparison(s): Compared to prior TTE on 07/2021, the ascending aorta  measures 22m (previously 341m but the more distal aspect of the  ascending aorta is better captured on current study when compared to  prior. Otherwise, there is no significant change.   FINDINGS   Left Ventricle: Left ventricular ejection fraction, by estimation, is 55  to 60%. The left ventricle has normal function. The left ventricle has no  regional wall motion abnormalities. The left ventricular internal cavity  size was mildly dilated. There is   mild concentric left ventricular hypertrophy. Left ventricular diastolic  parameters are consistent with Grade I diastolic dysfunction (impaired  relaxation).   Right Ventricle: The right ventricular size is normal. No increase in  right ventricular wall thickness.  Right ventricular systolic function is  normal.   Left Atrium: Left atrial size was mildly dilated.   Right Atrium: Right atrial size was normal in size.   Pericardium: There is no evidence of pericardial effusion.   Mitral Valve: The mitral valve is normal in structure. Mild mitral valve  regurgitation. No evidence of mitral valve stenosis.   Tricuspid Valve: The tricuspid valve is normal in structure. Tricuspid  valve regurgitation is trivial.   Aortic Valve: The aortic valve is tricuspid. There is mild thickening of  the aortic valve. Aortic valve regurgitation is trivial. No aortic  stenosis is present.   Pulmonic Valve: The pulmonic valve was normal in structure. Pulmonic valve  regurgitation is trivial.   Aorta: Aortic dilatation noted. There is mild dilatation of the ascending  aorta, measuring 40 mm.   Venous: The inferior vena  cava is normal in size with greater than 50%  respiratory variability, suggesting right atrial pressure of 3 mmHg.   IAS/Shunts: The atrial septum is grossly normal.    EKG:  EKG is  ordered today.  The ekg ordered today demonstrates NSR 81 bpm, LVH  Recent Labs: 05/14/2022: ALT 18; BUN 24; Creatinine, Ser 0.91; Hemoglobin 11.0; Platelets 183; Potassium 4.9; Sodium 142  Recent Lipid Panel    Component Value Date/Time   CHOL 201 (H) 09/08/2021 1229   TRIG 81 09/08/2021 1229   HDL 67 09/08/2021 1229   CHOLHDL 3.0 09/08/2021 1229   CHOLHDL 2.0 06/27/2015 1216   VLDL 9 06/27/2015 1216   LDLCALC 119 (H) 09/08/2021 1229    Home Medications   Current Meds  Medication Sig   acetaminophen (TYLENOL) 500 MG tablet Take 2 tablets (1,000 mg total) by mouth 2 (two) times daily as needed.   albuterol (VENTOLIN HFA) 108 (90 Base) MCG/ACT inhaler Inhale 1 puff every 6 hours as needed for shortness of breath   aspirin 81 MG chewable tablet Chew by mouth daily.   carvedilol (COREG) 25 MG tablet Take 1 tablet (25 mg total) by mouth 2 (two) times daily.    cetirizine (ZYRTEC) 10 MG tablet Take 1 tablet (10 mg total) by mouth daily.   Ciclopirox-Salicylic Acid 5.63-1 % SHAM Apply 1 Application topically 2 (two) times daily.   cloNIDine (CATAPRES - DOSED IN MG/24 HR) 0.1 mg/24hr patch Place 1 patch (0.1 mg total) onto the skin once a week.   cyclobenzaprine (FLEXERIL) 5 MG tablet Take 1 tablet (5 mg total) by mouth 3 (three) times daily as needed for muscle spasms.   diclofenac Sodium (VOLTAREN) 1 % GEL Apply 4 g topically 4 (four) times daily.   docusate sodium (COLACE) 100 MG capsule Take 1 capsule (100 mg total) by mouth 2 (two) times daily as needed.   furosemide (LASIX) 20 MG tablet Take 1 tablet (20 mg total) by mouth daily.   hydrocortisone 2.5 % cream Apply topically 2 (two) times daily.   hydrOXYzine (ATARAX) 25 MG tablet Take 1 tablet (25 mg total) by mouth 3 (three) times daily as needed.   ibuprofen (ADVIL) 800 MG tablet Take 1 tablet (800 mg total) by mouth 3 (three) times daily.   linaclotide (LINZESS) 72 MCG capsule Take 1 capsule (72 mcg total) by mouth daily before breakfast.   methocarbamol (ROBAXIN-750) 750 MG tablet Take 1 tablet (750 mg total) by mouth 4 (four) times daily.   montelukast (SINGULAIR) 10 MG tablet TAKE 1 TABLET (10 MG TOTAL) BY MOUTH AT BEDTIME.   nitroGLYCERIN (NITROSTAT) 0.4 MG SL tablet Place 1 tablet (0.4 mg total) under the tongue every 5 (five) minutes as needed for chest pain (max 3 doses, if no relief call 911).   NONFORMULARY OR COMPOUNDED ITEM Diclofenac 3%, Gabapentin 5%, Lidocaine 5%, Menthol 1%.  Apply 1-2 pumps three to four times per day as needed   olmesartan (BENICAR) 40 MG tablet Take 1 tablet (40 mg total) by mouth daily.   spironolactone (ALDACTONE) 100 MG tablet Take 1 tablet (100 mg total) by mouth daily.   Current Facility-Administered Medications for the 09/10/22 encounter (Office Visit) with Elgie Collard, PA-C  Medication   Triamcinolone Acetonide (ZILRETTA) intra-articular injection 32  mg   Triamcinolone Acetonide (ZILRETTA) intra-articular injection 32 mg     Review of Systems      All other systems reviewed and are otherwise negative except as noted above.  Physical Exam    VS:  BP 112/72   Pulse 81   Ht '5\' 1"'$  (1.549 m)   Wt 250 lb 6.4 oz (113.6 kg)   LMP 04/03/2017 (Approximate)   SpO2 96%   BMI 47.31 kg/m  , BMI Body mass index is 47.31 kg/m.  Wt Readings from Last 3 Encounters:  09/10/22 250 lb 6.4 oz (113.6 kg)  07/09/22 255 lb 12.8 oz (116 kg)  05/14/22 252 lb 9.6 oz (114.6 kg)     GEN: Well nourished, well developed, in no acute distress. HEENT: normal. Neck: Supple, no JVD, carotid bruits, or masses. Cardiac: RRR, no murmurs, rubs, or gallops. No clubbing, cyanosis, edema.  Radials/PT 2+ and equal bilaterally.  Respiratory:  Respirations regular and unlabored, clear to auscultation bilaterally. GI: Soft, nontender, nondistended. MS: No deformity or atrophy. Skin: Warm and dry, no rash. Neuro:  Strength and sensation are intact. Psych: Normal affect.  Assessment & Plan    Leg edema -This is a little better today -Continue Lasix 20 daily  Hypertension -Well-controlled today -She is on Coreg 25 mg twice daily, clonidine 0.1 mg (patch), Lasix 20 mg daily, Benicar 40 mg daily, and spironolactone 100 mg daily.  OSA -She has been compliant with her CPAP -no issues  Nonrheumatic mitral valve regurgitation -echo reviewed 07/23/22  Dilated aortic root -echo due 12/24         Disposition: Follow up 3 months with Fransico Him, MD or APP.  Signed, Elgie Collard, PA-C 09/10/2022, 5:21 PM [facility]

## 2022-09-10 ENCOUNTER — Other Ambulatory Visit: Payer: Self-pay

## 2022-09-10 ENCOUNTER — Ambulatory Visit: Payer: Medicaid Other | Attending: Physician Assistant | Admitting: Physician Assistant

## 2022-09-10 ENCOUNTER — Encounter: Payer: Self-pay | Admitting: Physician Assistant

## 2022-09-10 VITALS — BP 112/72 | HR 81 | Ht 61.0 in | Wt 250.4 lb

## 2022-09-10 DIAGNOSIS — I340 Nonrheumatic mitral (valve) insufficiency: Secondary | ICD-10-CM

## 2022-09-10 DIAGNOSIS — R600 Localized edema: Secondary | ICD-10-CM

## 2022-09-10 DIAGNOSIS — R9431 Abnormal electrocardiogram [ECG] [EKG]: Secondary | ICD-10-CM | POA: Diagnosis not present

## 2022-09-10 DIAGNOSIS — I77810 Thoracic aortic ectasia: Secondary | ICD-10-CM

## 2022-09-10 DIAGNOSIS — G4733 Obstructive sleep apnea (adult) (pediatric): Secondary | ICD-10-CM

## 2022-09-10 MED ORDER — NITROGLYCERIN 0.4 MG SL SUBL
0.4000 mg | SUBLINGUAL_TABLET | SUBLINGUAL | 5 refills | Status: AC | PRN
  Filled 2022-09-10: qty 25, 1d supply, fill #0
  Filled 2022-09-17: qty 25, 8d supply, fill #0

## 2022-09-10 NOTE — Patient Instructions (Signed)
Medication Instructions:  Start sublingual nitroglycerin 0.4 mg, place one tablet under the tongue as needed for chest pain, max 3 doses, if no relief call 911. *If you need a refill on your cardiac medications before your next appointment, please call your pharmacy*   Lab Work: None ordered If you have labs (blood work) drawn today and your tests are completely normal, you will receive your results only by: [place] (if you have MyChart) OR A paper copy in the mail If you have any lab test that is abnormal or we need to change your treatment, we will call you to review the results.   Follow-Up: At [facility], you and your health needs are our priority.  As part of our continuing mission to provide you with exceptional heart care, we have created designated Provider Care Teams.  These Care Teams include your primary Cardiologist (physician) and Advanced Practice Providers (APPs -  Physician Assistants and Nurse Practitioners) who all work together to provide you with the care you need, when you need it.  We recommend signing up for the patient portal called "MyChart".  Sign up information is provided on this After Visit Summary.  MyChart is used to connect with patients for Virtual Visits (Telemedicine).  Patients are able to view lab/test results, encounter notes, upcoming appointments, etc.  Non-urgent messages can be sent to your provider as well.   To learn more about what you can do with MyChart, go to [email or web address].    Your next appointment:   3 month(s)  Provider:   Fransico Him, MD  or APP

## 2022-09-11 ENCOUNTER — Other Ambulatory Visit: Payer: Self-pay | Admitting: Nurse Practitioner

## 2022-09-12 ENCOUNTER — Other Ambulatory Visit: Payer: Self-pay | Admitting: Nurse Practitioner

## 2022-09-12 ENCOUNTER — Other Ambulatory Visit: Payer: Self-pay

## 2022-09-13 ENCOUNTER — Other Ambulatory Visit: Payer: Self-pay

## 2022-09-13 ENCOUNTER — Ambulatory Visit (INDEPENDENT_AMBULATORY_CARE_PROVIDER_SITE_OTHER): Payer: Medicaid Other | Admitting: Mental Health

## 2022-09-13 DIAGNOSIS — F322 Major depressive disorder, single episode, severe without psychotic features: Secondary | ICD-10-CM

## 2022-09-13 MED ORDER — CYCLOBENZAPRINE HCL 5 MG PO TABS
5.0000 mg | ORAL_TABLET | Freq: Three times a day (TID) | ORAL | 0 refills | Status: DC | PRN
  Filled 2022-09-13 – 2022-09-21 (×2): qty 30, 10d supply, fill #0

## 2022-09-13 NOTE — Progress Notes (Signed)
   THERAPIST PROGRESS NOTE  Session Time: 12:30pm ( 60 minutes)   Participation Level: Active  Behavioral Response: CasualAlertDepressed  Type of Therapy: Individual Therapy  Treatment Goals addressed:  STG: Caroline Ryan will decrease sxs of depression AEB development of x 3 effective coping skills with ability to reframe distorted thinking patterns within the next x 6 months    ProgressTowards Goals: Initial  Interventions: Supportive CBT  Summary: Caroline Ryan is a 57 y.o. female who presents with dx of major depression single severe. Caroline Ryan presents alert and oriented; mood and affect low; tearful at times. Speech clear and coherent at normal rate and tone. Engaged and receptive to interventions. Shares minimal sxs improvement with use of coping with prayer and reading her bible. Reports ongoing low mood and depression. Shares concerning not able to work due to knee pain, difficulty getting around with relying on others. Shares financial stress and lack of adequate supports. Engaged with therapist and exploring mourning life in which she used to live of being highly active and enjoying working. Shares to feel self conscious of thoughts of others watching her with her cane. Shares to have also found out ex boyfriend to currently be in jail due to stabbing someone and shares with therapist history of relationship and need to break up due to his excess alcohol use and starting to use crack/cocaine. Able to process with therapist working to engage in coping skills and exploration of things in which she can engage in and not isolating self from friends. Denies current safety concerns; denies SI/HI.  Minimal improvement with sxs however ongoing sxs of depression remain. Working to make progress with goals with exploration of coping.   Suicidal/Homicidal: Nowithout intent/plan  Therapist Response: Therapist engaged Caroline Ryan in therapy session. Completed check in and assessed for current level of  functioning, sxs management and level of stress. Provided safe space for Caroline Ryan to share thoughts and feelings; providing supportive feedback. Validated feelings. Explored with Caroline Ryan ability to mourn previous level of functioning and explored feelings of hope for improvement when able to secure surgery. Explored factors taht would support in increase level of functioning and supported in completion of SKAT application. Encouraged Caroline Ryan to work to explore coping and discouraged isolation. Reviewed session and provided follow up. Assessed for safety   Plan: Return again in x 4 weeks.  Diagnosis: Severe major depression, single episode ([place])  Collaboration of Care: Other Completed referral to [facility] was advised Release of Information must be obtained prior to any record release in order to collaborate their care with an outside provider. Patient/Guardian was advised if they have not already done so to contact the registration department to sign all necessary forms in order for [place] to release information regarding their care.   Consent: Patient/Guardian gives verbal consent for treatment and assignment of benefits for services provided during this visit. Patient/Guardian expressed understanding and agreed to proceed.   Rockey Situ [place], [facility] 09/14/2022

## 2022-09-17 ENCOUNTER — Other Ambulatory Visit: Payer: Self-pay

## 2022-09-19 ENCOUNTER — Other Ambulatory Visit: Payer: Self-pay

## 2022-09-19 ENCOUNTER — Other Ambulatory Visit (HOSPITAL_COMMUNITY): Payer: Self-pay

## 2022-09-20 ENCOUNTER — Other Ambulatory Visit: Payer: Self-pay | Admitting: Nurse Practitioner

## 2022-09-20 ENCOUNTER — Other Ambulatory Visit (HOSPITAL_BASED_OUTPATIENT_CLINIC_OR_DEPARTMENT_OTHER): Payer: Self-pay

## 2022-09-20 ENCOUNTER — Other Ambulatory Visit: Payer: Self-pay

## 2022-09-20 DIAGNOSIS — J4520 Mild intermittent asthma, uncomplicated: Secondary | ICD-10-CM

## 2022-09-20 MED ORDER — COMIRNATY 30 MCG/0.3ML IM SUSY
PREFILLED_SYRINGE | INTRAMUSCULAR | 0 refills | Status: DC
  Filled 2022-09-20: qty 0.3, 1d supply, fill #0

## 2022-09-21 ENCOUNTER — Other Ambulatory Visit (HOSPITAL_BASED_OUTPATIENT_CLINIC_OR_DEPARTMENT_OTHER): Payer: Self-pay

## 2022-09-21 ENCOUNTER — Ambulatory Visit (INDEPENDENT_AMBULATORY_CARE_PROVIDER_SITE_OTHER): Payer: Medicaid Other | Admitting: Psychiatry

## 2022-09-21 ENCOUNTER — Other Ambulatory Visit: Payer: Self-pay

## 2022-09-21 VITALS — BP 128/66 | HR 82 | Wt 250.0 lb

## 2022-09-21 DIAGNOSIS — F321 Major depressive disorder, single episode, moderate: Secondary | ICD-10-CM

## 2022-09-21 DIAGNOSIS — F411 Generalized anxiety disorder: Secondary | ICD-10-CM | POA: Diagnosis not present

## 2022-09-21 MED ORDER — ESCITALOPRAM OXALATE 10 MG PO TABS
10.0000 mg | ORAL_TABLET | Freq: Every day | ORAL | 1 refills | Status: DC
  Filled 2022-09-21: qty 30, 30d supply, fill #0
  Filled 2022-11-30: qty 30, 30d supply, fill #1

## 2022-09-21 MED ORDER — ALBUTEROL SULFATE HFA 108 (90 BASE) MCG/ACT IN AERS
INHALATION_SPRAY | RESPIRATORY_TRACT | 5 refills | Status: DC
  Filled 2022-09-21: qty 18, 25d supply, fill #0
  Filled 2022-10-15: qty 18, 25d supply, fill #1
  Filled 2023-01-18: qty 6.7, 30d supply, fill #2
  Filled 2023-02-25: qty 18, 30d supply, fill #3
  Filled 2023-05-21: qty 18, 30d supply, fill #4
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
## 2022-09-24 ENCOUNTER — Other Ambulatory Visit: Payer: Self-pay

## 2022-09-24 DIAGNOSIS — G4733 Obstructive sleep apnea (adult) (pediatric): Secondary | ICD-10-CM | POA: Diagnosis not present

## 2022-09-25 ENCOUNTER — Other Ambulatory Visit: Payer: Self-pay

## 2022-09-25 ENCOUNTER — Encounter (HOSPITAL_COMMUNITY): Payer: Self-pay | Admitting: Psychiatry

## 2022-10-01 ENCOUNTER — Other Ambulatory Visit: Payer: Medicaid Other

## 2022-10-01 NOTE — Patient Instructions (Signed)
  Medicaid Managed Care   Unsuccessful Outreach Note  10/01/2022 Name: Caroline Ryan MRN: [id number] DOB: [birth year]  Referred by: Fenton Foy, NP Reason for referral : High Risk Managed Medicaid (MM Social Work telephone outreach )   An unsuccessful telephone outreach was attempted today. The patient was referred to the case management team for assistance with care management and care coordination.   Follow Up Plan: A HIPAA compliant phone message was left for the patient providing contact information and requesting a return call.   Mickel Fuchs, BSW, [place] Managed Medicaid Team  [phone]

## 2022-10-01 NOTE — Patient Outreach (Signed)
  Medicaid Managed Care   Unsuccessful Outreach Note  10/01/2022 Name: Caroline Ryan MRN: [id number] DOB: 08/17/1966  Referred by: Fenton Foy, NP Reason for referral : High Risk Managed Medicaid (MM Social Work telephone outreach )   An unsuccessful telephone outreach was attempted today. The patient was referred to the case management team for assistance with care management and care coordination.   Follow Up Plan: A HIPAA compliant phone message was left for the patient providing contact information and requesting a return call.   Mickel Fuchs, BSW, [place] Managed Medicaid Team  [phone]

## 2022-10-09 ENCOUNTER — Encounter
Payer: Medicaid Other | Attending: Physical Medicine and Rehabilitation | Admitting: Physical Medicine and Rehabilitation

## 2022-10-09 ENCOUNTER — Other Ambulatory Visit: Payer: Self-pay

## 2022-10-09 VITALS — BP 131/84 | HR 73 | Temp 97.9°F | Ht 61.0 in | Wt 250.0 lb

## 2022-10-09 DIAGNOSIS — M170 Bilateral primary osteoarthritis of knee: Secondary | ICD-10-CM

## 2022-10-09 DIAGNOSIS — R0981 Nasal congestion: Secondary | ICD-10-CM

## 2022-10-09 DIAGNOSIS — I890 Lymphedema, not elsewhere classified: Secondary | ICD-10-CM | POA: Diagnosis not present

## 2022-10-09 DIAGNOSIS — D5700 Hb-SS disease with crisis, unspecified: Secondary | ICD-10-CM | POA: Diagnosis not present

## 2022-10-09 MED ORDER — TRIAMCINOLONE ACETONIDE 32 MG IX SRER
64.0000 mg | Freq: Once | INTRA_ARTICULAR | Status: AC
  Administered 2022-10-09: 64 mg via INTRA_ARTICULAR

## 2022-10-09 MED ORDER — FLUTICASONE PROPIONATE 50 MCG/ACT NA SUSP
1.0000 | Freq: Every day | NASAL | 2 refills | Status: AC
  Filled 2022-10-09: qty 16, 25d supply, fill #0

## 2022-10-09 NOTE — Progress Notes (Signed)
Knee injection, bilateral  Indication:Bilateral Knee pain not relieved by medication management and other conservative care.  Informed consent was obtained after describing risks and benefits of the procedure with the patient, this includes bleeding, bruising, infection and medication side effects. The patient wishes to proceed and has given written consent. The patient was placed in a recumbent position. The medial aspect of the knee was marked and prepped with Betadine and alcohol. It was then entered with a 25-gauge 1-1/2 inch needle and Zilretta was  inserted into the knee joint. The patient tolerated the procedure well. Post procedure instructions were given.

## 2022-10-09 NOTE — Progress Notes (Signed)
Subjective:    Patient ID: Caroline Ryan, female    DOB: 1966/08/01, 57 y.o.   MRN: [id number]  HPI   Caroline Ryan presents for f/u of impaired mobility secondary to bilateral knee pain, obesity,leg swelling, sickle cell disease, and nasal congestion  1) Bilateral knee pain  -We discussed her MRI results previously and she would like to pursue arthroscopic debridement if surgeon feels this would be appropriate. Stairs worsen her pain- she uses rails and tries to minimize using the stairs. The other day her legs gave way.  -she would like to try the compounding cream -she would like repeat steroid injection as she is going to be going on vacation during the first week of August and would like to be as active as possible then  -Her pain has been well controlled, was a little sore after Zilretta injections but feeling a little better. She asked what exactly Zilretta injections contain. She had good response to prior Zilretta injection in May, repeated today, see separate procedure note  2) Hypertension BP 152/92 in office previously. It is usually 140/70. Diet has ben pretty good but she still struggles to lose weight.   The cream was too expensive for her at [facility]. She has been trying to walk. Her mood is a little more down today. The handicap sticker allows her to go out more.  She has been taking ibuprofen 862m and Gabapentin. She has tried Tylenol in the past and it does not help much. She would like to repeat Monovisc next visit in December.   3) Obesity -weight has decreased to 250 lbs but she is frustrated it is not getting lower -Last eats at 8pm -she is frustrated that WMancel Parsonsis not covered for her -Asks if I may write a letter regarding why WMancel Parsonswould be beneficial for her and I have done so, she asks that we mail this to her and she plans to talk with her insurance company.   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 She is unable to increase her hours due to her limited mobility from her knee pain. She asks about disability as she currently receives limited financial payment and it is difficult for her to pay her bills. She does not have much knowledge of computers to try for a computer-based work-from-home job.   5) Nasal congestion: -she asks for flonase  6) Sickle cell disease -she would like to see a specialist  7) leg swelling -she asks if this is lymphedema.  -her swelling is worsenined  Prior history: She is feeling very fatigued today. I have bene unable to get her into see the OBGYN as they did not accept her insurance.   Prior history:  [name] a 57year old woman who presents with bilateral knee OA. Last visit we did bilateral knee viscosupplementation injections that has helped her so much.   She was helping her dad move last night.   Her BP is well controlled.   She is taking the Norco about twice per day. This is the only medication that helps to ease her pain.   She hopes to be able to do shopping and spend time with her daughter.   She is 257 lbs, same weight is last time.   She is walking more than last month.   She wants to try a juice diet and  help get her weight off. She has been eating fruits and vegetables.  She has been noting sharp pains shooting down her legs and arms. She felt weak when she had this pain. Her arm buckled when she had this pain.  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   Pain is 9/10 on average, and 7/10 right now.   She was diagnosed with fibroids and is stressed by the fact that she has not heard from the gynecologist yet.   She is also having hot flashes from menopause and asks what kind of treatments are available.   Pain Inventory Average Pain 8 Pain Right Now 8 My pain is sharp, burning and stabbing  In the last 24 hours, has pain interfered with the  following? General activity 8 Relation with others 7 Enjoyment of life 9 What TIME of day is your pain at its worst? daytime Sleep (in general) Fair  Pain is worse with: walking and sitting Pain improves with: rest, heat/ice, therapy/exercise, medication, TENS and injections Relief from Meds: 8  Family History  Problem Relation Age of Onset   Breast cancer Mother 27   Hypertension Father    Diabetes Brother    Diabetes Brother    Other Neg Hx    Colon polyps Neg Hx    Colon cancer Neg Hx    Esophageal cancer Neg Hx    Stomach cancer Neg Hx    Rectal cancer Neg Hx    Social History   Socioeconomic History   Marital status: Single    Spouse name: Not on file   Number of children: Not on file   Years of education: Not on file   Highest education level: Not on file  Occupational History   Not on file  Tobacco Use   Smoking status: Former    Packs/day: 0.25    Years: 2.00    Total pack years: 0.50    Types: Cigarettes    Quit date: 08/20/2014    Years since quitting: 8.1   Smokeless tobacco: Never  Vaping Use   Vaping Use: Never used  Substance and Sexual Activity   Alcohol use: Yes    Alcohol/week: 2.0 standard drinks of alcohol    Types: 2 Glasses of wine per week   Drug use: No   Sexual activity: Yes    Birth control/protection: Condom  Other Topics Concern   Not on file  Social History Narrative   Not on file   Social Determinants of Health   Financial Resource Strain: High Risk (08/21/2022)   Overall Financial Resource Strain (CARDIA)    Difficulty of Paying Living Expenses: Very hard  Food Insecurity: Food Insecurity Present (08/21/2022)   Hunger Vital Sign    Worried About [place]mo in the Last Year: Often true    Ran Out of Food in the Last Year: Often true  Transportation Needs: No Transportation Needs (08/21/2022)   PRAPARE - Hydrologist (Medical): No    Lack of Transportation (Non-Medical): No  Physical  Activity: Inactive (08/21/2022)   Exercise Vital Sign    Days of Exercise per Week: 0 days    Minutes of Exercise per Session: 0 min  Stress: Stress Concern Present (07/27/2022)   Caroline Ryan    Feeling of Stress : Very much  Social Connections: Socially Isolated (08/21/2022)   Social Connection and Isolation Panel [NHANES]    Frequency of  Communication with Friends and Family: More than three times a week    Frequency of Social Gatherings with Friends and Family: Never    Attends Religious Services: Never    Marine scientist or Organizations: No    Attends Archivist Meetings: Never    Marital Status: Never married   Past Surgical History:  Procedure Laterality Date   ABDOMINAL HYSTERECTOMY     BILATERAL SALPINGECTOMY Bilateral 10/10/2017   Procedure: BILATERAL SALPINGECTOMY;  Surgeon: Chancy Milroy, MD;  Location: [facility];  Service: Gynecology;  Laterality: Bilateral;   BREAST BIOPSY     US guided core 2009   BREAST EXCISIONAL BIOPSY     right 1982 left [place]     bilateral cysts/benign   COLONOSCOPY  10/17/2021   VAGINAL HYSTERECTOMY N/A 10/10/2017   Procedure: HYSTERECTOMY VAGINAL;  Surgeon: Chancy Milroy, MD;  Location: [facility];  Service: Gynecology;  Laterality: N/A;   Past Surgical History:  Procedure Laterality Date   ABDOMINAL HYSTERECTOMY     BILATERAL SALPINGECTOMY Bilateral 10/10/2017   Procedure: BILATERAL SALPINGECTOMY;  Surgeon: Chancy Milroy, MD;  Location: [facility];  Service: Gynecology;  Laterality: Bilateral;   BREAST BIOPSY     US guided core 2009   BREAST EXCISIONAL BIOPSY     right 1982 left [address]     bilateral cysts/benign   COLONOSCOPY  10/17/2021   VAGINAL HYSTERECTOMY N/A 10/10/2017   Procedure: HYSTERECTOMY VAGINAL;  Surgeon: Chancy Milroy, MD;  Location: [facility];  Service: Gynecology;  Laterality: N/A;   Past Medical History:   Diagnosis Date   Anemia    Ascending aorta dilatation (HCC)    39m by echo 07/2021 and 434mby echo 07/2022   Asthma    Depression    no meds   Eczema    Fibroid    Headache(784.0)    otc prn med   History of blood transfusion    Hypertension    Knee pain, bilateral    arthralgia knee pain bilateral   Lower extremity edema    Mitral regurgitation    Mild to moderate by echo 07/2021   Seasonal allergies    SVD (spontaneous vaginal delivery)    x 3   Vaginal polyp 05/2020   BP 131/84   Pulse 73   Temp 97.9 F (36.6 C)   Ht 5' 1"$  (1.549 m)   Wt 250 lb (113.4 kg)   LMP 04/03/2017 (Approximate)   SpO2 98%   BMI 47.24 kg/m   Opioid Risk Score:   Fall Risk Score:  `1  [facility]     09/21/2022    1:39 PM 08/21/2022   10:11 AM 07/27/2022   11:40 AM 07/09/2022   11:13 AM 05/21/2022    2:23 PM 05/14/2022    3:39 PM 05/02/2022   11:32 AM  Depression screen PHQ 2/9  Decreased Interest   3 0 0 0 1  Down, Depressed, Hopeless   3 0 2 3 1  $ PHQ - 2 Score   6 0 2 3 2  $ Altered sleeping   1  1  1  $ Tired, decreased energy   1  1  1  $ Change in appetite   0  0  0  Feeling bad or failure about yourself    2  0  0  Trouble concentrating   0  0  0  Moving slowly or fidgety/restless  0  0  0  Suicidal thoughts   0  0  0  PHQ-9 Score   10  4  4  $ Difficult doing work/chores   Very difficult  Somewhat difficult  Somewhat difficult     Information is confidential and restricted. Go to Review Flowsheets to unlock data.     Review of Systems  Constitutional: Negative.   HENT: Negative.    Eyes: Negative.   Respiratory: Negative.    Cardiovascular: Negative.   Gastrointestinal: Negative.   Endocrine: Negative.   Genitourinary: Negative.   Musculoskeletal:  Positive for arthralgias and gait problem.  Skin: Negative.   Allergic/Immunologic: Negative.   Hematological: Negative.   Psychiatric/Behavioral: Negative.    All other systems reviewed and are  negative.      Objective:   Physical Exam .Gen: no distress, normal appearing HEENT: oral mucosa pink and moist, NCAT Cardio: Reg rate Chest: normal effort, normal rate of breathing Abd: soft, non-distended Ext: no edema Psych: pleasant, normal affect Skin: intact Neuro: Alert and oriented x3 MSK: has edema in lower extremities, but primarily located around knees and thighs and not in the lower leg Assessment & Plan:  Mrs. Choudhury is a 57 year old woman presenting to establish care for bilateral chronic knee pain, neck pain.   1) Bilateral knee OA: -XRs reviewed and consistent with OA -Reviewed MRI results which show bilaterals severe osteoarthritis which is worst in the medial compartment, as well as severe bilateral cartilage damage. Discussed that arthroscopic debridement may be an option for her since she has been asked to lose weight prior to TKA and this has been difficult for her.  -She received steroid injections q38month. -She had viscosupplementation injections in July with excellent benefits.  -Vitamin D level normal.  -Recommended blue emu oil -Discussed her walking.Made goal to increase to 15 minutes at least 4 times per week.  -Discussed that every pound she loses is 6 lbs off her knees.  -Increase Gabapentin to 6041mTID. Start at night and don't drive initially after starting higher dose as could make her very sleepy. Educated that Gabapentin can help with her hot flashes.  -continue to work on weight loss -Schedule bilateral steroid injection on Friday July 28th, discussed with patient, staff to get prior auth -discussed response to prior Zilretta injection and that this is a safer option for the joint than regular steroid, but may not be approved more frequently than every 3 months -Called in compounding cream of Diclofenac 3%, Gabapentin 5%, Lidocaine 5%, Menthol 1% [facility] apply 1-2 pumps three to four times per day as needed    2)  Muscle cramps:magneisum level was normal last visit.   3) Morbid obesity: Weight is 250, 2 lbs up from last visit. Discussed intermittent fasting and low carb diet. Continue healthy diet. Made goal to start eating at 11:15am instead of 11am and to move dinner time from 8:30pm to 8:15pm. She has moved this to 8pm, discussed trying to move to 7:45pm.  HgbA1C 5.2- discussed that there is no prediabetes -referred to bariatric surgery -wrote letter for her explaining why she would [facility] that she may want to hold off on further Zilretta injections as this is a steroid injection that can cause weight gain  4) General health -Reviewed to gynecology for pap smear -Reviewed lipids and were normal last year.   5) Radicular symptoms in upper and lower extremities: -no evidence of weakness on exam to  suggest myelopathy -Cervical XR results reviewed with her: they show mild spondylosis at C5-6 and C6-7.  No acute fracture. Lumbar XR reviewed with patient and shows 1. No acute compression fracture. 2. A 7 mm anterolisthesis of L4 on L5, presumably degenerative in etiology. 3. Multilevel degenerative disc disease and facet arthrosis. -Patient worried about heart attack- advised that symptoms appear to be more radicular and are not accompanied by chest pain.   6) Palpitations:  -She has had occasional episodes -Denies chest pain. She gets SOB due to her asthma.   7) Iron deficiency anemia: Iron was very low in 2018- she has a history of menorrhagia and so she could continue to have anemia. Hemoglobin was within normal limits when checked in May. Discussed iron rich foods as she prefers no supplement at this time.   8) hypertension: Start clonodine patch 0.63m.   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 -discussed that she was previously denies disability and that she is impaired in her ability to walk and  stand due to her knee pain -discussed that she should look for work that requires upper body strength or cognition -discussed that I can complete disability paperwork for her addressing her current limitations -encouraged continued lifestyle changes to help reduce weight and knee pain  10) Nasal congestion -prescribed flonase  11) Leg swelling: -referred to hematology/oncology to assist in identifying if this is lymphedema -encouraged to follow-up with her PCP  12) Sickle cell disease -referred to hematology

## 2022-10-11 ENCOUNTER — Ambulatory Visit (HOSPITAL_COMMUNITY): Payer: Medicaid Other | Admitting: Mental Health

## 2022-10-11 ENCOUNTER — Other Ambulatory Visit: Payer: Self-pay

## 2022-10-15 ENCOUNTER — Other Ambulatory Visit: Payer: Self-pay

## 2022-10-15 ENCOUNTER — Other Ambulatory Visit: Payer: Self-pay | Admitting: Cardiology

## 2022-10-15 MED ORDER — OLMESARTAN MEDOXOMIL 40 MG PO TABS
40.0000 mg | ORAL_TABLET | Freq: Every day | ORAL | 3 refills | Status: DC
  Filled 2022-10-15: qty 90, 90d supply, fill #0
  Filled 2023-01-18: qty 30, 30d supply, fill #1
  Filled 2023-03-26: qty 30, 30d supply, fill #2
  Filled 2023-05-09: qty 30, 30d supply, fill #3
  Filled 2023-06-26: qty 30, 30d supply, fill #4
  Filled 2023-08-12: qty 30, 30d supply, fill #5
  Filled 2023-08-26: qty 30, 30d supply, fill #6

## 2022-10-17 ENCOUNTER — Other Ambulatory Visit: Payer: Self-pay

## 2022-10-18 ENCOUNTER — Other Ambulatory Visit: Payer: Self-pay

## 2022-10-19 ENCOUNTER — Encounter (HOSPITAL_COMMUNITY): Payer: 59 | Admitting: Psychiatry

## 2022-10-19 ENCOUNTER — Telehealth (HOSPITAL_COMMUNITY): Payer: Self-pay

## 2022-10-19 NOTE — Telephone Encounter (Signed)
Appointment - Telephone call with patient to follow up on a message she left this date at 12:51 pm to cancel and reschedule her appointment this date, stating she was not feeling well. Called patient back to verify this message was received but also to discuss threats patient and her reported brother had made on the message, stating with no one answering, "someone should just go up there and shoot the place up" and patient agreed.  Questioned patient if she was intending to communicate threats and that this type of discussion and statements were not acceptable. Patient stated they were just kidding and informed her brother was just high and kidding too.  Informed patient this nurse manage would be keeping the recording and if anything else occurred such as this or any behaviors that are seen as threatening or dangerous, we would no longer continue to see patient.  Patient apologized several times and stated she understood and did not mean for this to occur.  Assisted patient with rescheduling appointment with provider, Dr. Rosita Kea and also informed provider of inappropriate phone message left this date by patient and her reported brother.  Provider agreed with keeping voice message and with documenting event and also with instructions to patient.

## 2022-10-22 ENCOUNTER — Ambulatory Visit: Payer: Medicaid Other | Admitting: Podiatry

## 2022-10-23 ENCOUNTER — Emergency Department (HOSPITAL_COMMUNITY): Payer: Medicaid Other

## 2022-10-23 ENCOUNTER — Emergency Department (HOSPITAL_COMMUNITY)
Admission: EM | Admit: 2022-10-23 | Discharge: 2022-10-23 | Disposition: A | Discharge status: Home / Self Care | Payer: Medicaid Other | Attending: Emergency Medicine | Admitting: Emergency Medicine

## 2022-10-23 ENCOUNTER — Encounter (HOSPITAL_COMMUNITY): Payer: Self-pay

## 2022-10-23 DIAGNOSIS — J168 Pneumonia due to other specified infectious organisms: Secondary | ICD-10-CM | POA: Diagnosis not present

## 2022-10-23 DIAGNOSIS — J181 Lobar pneumonia, unspecified organism: Secondary | ICD-10-CM | POA: Diagnosis not present

## 2022-10-23 DIAGNOSIS — Z7982 Long term (current) use of aspirin: Secondary | ICD-10-CM | POA: Diagnosis not present

## 2022-10-23 DIAGNOSIS — Z7951 Long term (current) use of inhaled steroids: Secondary | ICD-10-CM | POA: Insufficient documentation

## 2022-10-23 DIAGNOSIS — Z79899 Other long term (current) drug therapy: Secondary | ICD-10-CM | POA: Insufficient documentation

## 2022-10-23 DIAGNOSIS — J45909 Unspecified asthma, uncomplicated: Secondary | ICD-10-CM | POA: Insufficient documentation

## 2022-10-23 DIAGNOSIS — R0789 Other chest pain: Secondary | ICD-10-CM | POA: Diagnosis not present

## 2022-10-23 DIAGNOSIS — G4733 Obstructive sleep apnea (adult) (pediatric): Secondary | ICD-10-CM | POA: Diagnosis not present

## 2022-10-23 DIAGNOSIS — Z1152 Encounter for screening for COVID-19: Secondary | ICD-10-CM | POA: Diagnosis not present

## 2022-10-23 DIAGNOSIS — I10 Essential (primary) hypertension: Secondary | ICD-10-CM | POA: Insufficient documentation

## 2022-10-23 DIAGNOSIS — J189 Pneumonia, unspecified organism: Secondary | ICD-10-CM

## 2022-10-23 DIAGNOSIS — R079 Chest pain, unspecified: Secondary | ICD-10-CM | POA: Diagnosis not present

## 2022-10-23 DIAGNOSIS — R000 Tachycardia, unspecified: Secondary | ICD-10-CM | POA: Diagnosis not present

## 2022-10-23 LAB — CBC
HCT: 32.2 % — ABNORMAL LOW (ref 36.0–46.0)
Hemoglobin: 9.5 g/dL — ABNORMAL LOW (ref 12.0–15.0)
MCH: 31.3 pg (ref 26.0–34.0)
MCHC: 29.5 g/dL — ABNORMAL LOW (ref 30.0–36.0)
MCV: 105.9 fL — ABNORMAL HIGH (ref 80.0–100.0)
Platelets: 141 10*3/uL — ABNORMAL LOW (ref 150–400)
RBC: 3.04 MIL/uL — ABNORMAL LOW (ref 3.87–5.11)
RDW: 13.1 % (ref 11.5–15.5)
WBC: 10.9 10*3/uL — ABNORMAL HIGH (ref 4.0–10.5)
nRBC: 0 % (ref 0.0–0.2)

## 2022-10-23 LAB — BASIC METABOLIC PANEL
Anion gap: 9 (ref 5–15)
BUN: 19 mg/dL (ref 6–20)
CO2: 22 mmol/L (ref 22–32)
Calcium: 9.2 mg/dL (ref 8.9–10.3)
Chloride: 104 mmol/L (ref 98–111)
Creatinine, Ser: 0.85 mg/dL (ref 0.44–1.00)
GFR, Estimated: >60 mL/min (ref >60)
Glucose, Bld: 150 mg/dL — ABNORMAL HIGH (ref 70–99)
Potassium: 4.2 mmol/L (ref 3.5–5.1)
Sodium: 135 mmol/L (ref 135–145)

## 2022-10-23 LAB — RESP PANEL BY RT-PCR (RSV, FLU A&B, COVID)  RVPGX2
Influenza A by PCR: NEGATIVE
Influenza B by PCR: NEGATIVE
Resp Syncytial Virus by PCR: NEGATIVE
SARS Coronavirus 2 by RT PCR: NEGATIVE

## 2022-10-23 LAB — TROPONIN I (HIGH SENSITIVITY)
Troponin I (High Sensitivity): 11 ng/L (ref <18)
Troponin I (High Sensitivity): 13 ng/L (ref <18)

## 2022-10-23 LAB — BRAIN NATRIURETIC PEPTIDE: B Natriuretic Peptide: 95.5 pg/mL (ref 0.0–100.0)

## 2022-10-23 LAB — POC OCCULT BLOOD, ED: Fecal Occult Bld: NEGATIVE

## 2022-10-23 MED ORDER — OXYCODONE-ACETAMINOPHEN 5-325 MG PO TABS
1.0000 | ORAL_TABLET | Freq: Once | ORAL | Status: AC
  Administered 2022-10-23: 1 via ORAL
  Filled 2022-10-23: qty 1

## 2022-10-23 MED ORDER — AZITHROMYCIN 250 MG PO TABS: 250.0000 mg | ORAL_TABLET | Freq: Every day | ORAL | 0 refills | Status: DC

## 2022-10-23 MED ORDER — AZITHROMYCIN 250 MG PO TABS
250.0000 mg | ORAL_TABLET | Freq: Every day | ORAL | 0 refills | Status: DC
  Filled 2022-10-23: qty 6, 6d supply, fill #0

## 2022-10-23 MED ORDER — BENZONATATE 100 MG PO CAPS: 100.0000 mg | ORAL_CAPSULE | Freq: Three times a day (TID) | ORAL | 0 refills | Status: DC

## 2022-10-23 MED ORDER — SODIUM CHLORIDE 0.9 % IV BOLUS
1000.0000 mL | Freq: Once | INTRAVENOUS | Status: AC
  Administered 2022-10-23: 1000 mL via INTRAVENOUS

## 2022-10-23 MED ORDER — NEBULIZER MASK ADULT MISC: 0 refills | Status: AC

## 2022-10-23 MED ORDER — METHYLPREDNISOLONE SODIUM SUCC 125 MG IJ SOLR
125.0000 mg | Freq: Once | INTRAMUSCULAR | Status: AC
  Administered 2022-10-23: 125 mg via INTRAVENOUS
  Filled 2022-10-23: qty 2

## 2022-10-23 MED ORDER — IOHEXOL 350 MG/ML SOLN
80.0000 mL | Freq: Once | INTRAVENOUS | Status: AC | PRN
  Administered 2022-10-23: 80 mL via INTRAVENOUS

## 2022-10-23 MED ORDER — IPRATROPIUM-ALBUTEROL 0.5-2.5 (3) MG/3ML IN SOLN
3.0000 mL | Freq: Once | RESPIRATORY_TRACT | Status: AC
  Administered 2022-10-23: 3 mL via RESPIRATORY_TRACT
  Filled 2022-10-23: qty 3

## 2022-10-23 MED ORDER — IPRATROPIUM-ALBUTEROL 0.5-2.5 (3) MG/3ML IN SOLN
3.0000 mL | Freq: Four times a day (QID) | RESPIRATORY_TRACT | 0 refills | Status: DC | PRN
  Filled 2022-10-23: qty 360, 30d supply, fill #0

## 2022-10-23 MED ORDER — IPRATROPIUM-ALBUTEROL 0.5-2.5 (3) MG/3ML IN SOLN: 3.0000 mL | Freq: Four times a day (QID) | RESPIRATORY_TRACT | 0 refills | Status: AC | PRN

## 2022-10-23 NOTE — ED Provider Notes (Signed)
[facility] Provider Note   CSN: [id number] Arrival date & time: 10/23/22  1107     History  Chief Complaint  Patient presents with   Chest Pain    Caroline Ryan is a 57 y.o. female with a past medical history of asthma, hypertension, mitral valve regurgitation presents for evaluation of chest pain and shortness of breath.  Patient reports that she started her chest pain last night.  Pain is located in the center of her chest, radiates to her back and left flank, intermittent.  Patient also reports some shortness of breath which she took inhalers at home with some relief.  She was given 4 baby aspirins by EMS, refused nitro because she thinks the chest pain has improved.  Tachycardic with EMS in the 140s.  She denies any fever, cough, runny nose, nausea, vomiting, bowel changes, urinary symptoms.  She denies recent travel or surgery.  Denies any history of PE or DVT.    Chest Pain   Past Medical History:  Diagnosis Date   Anemia    Ascending aorta dilatation ([place])    60m by echo 07/2021 and 433mby echo 07/2022   Asthma    Depression    no meds   Eczema    Fibroid    Headache(784.0)    otc prn med   History of blood transfusion    Hypertension    Knee pain, bilateral    arthralgia knee pain bilateral   Lower extremity edema    Mitral regurgitation    Mild to moderate by echo 07/2021   Seasonal allergies    SVD (spontaneous vaginal delivery)    x 3   Vaginal polyp 05/2020   Past Surgical History:  Procedure Laterality Date   ABDOMINAL HYSTERECTOMY     BILATERAL SALPINGECTOMY Bilateral 10/10/2017   Procedure: BILATERAL SALPINGECTOMY;  Surgeon: ErChancy MilroyMD;  [facility];  Service: Gynecology;  Laterality: Bilateral;   BREAST BIOPSY     [place] 2009   BREAST EXCISIONAL BIOPSY     right 1982 [facility]   bilateral cysts/benign   COLONOSCOPY  10/17/2021   VAGINAL HYSTERECTOMY  N/A 10/10/2017   Procedure: HYSTERECTOMY VAGINAL;  Surgeon: ErChancy MilroyMD;  [facility];  Service: Gynecology;  Laterality: N/A;     Home Medications Prior to Admission medications   Medication Sig Start Date End Date Taking? Authorizing Provider  acetaminophen (TYLENOL) 325 MG tablet Take 650 mg by mouth daily as needed for moderate pain, headache or fever.   Yes [provider]  albuterol (VENTOLIN HFA) 108 (90 Base) MCG/ACT inhaler Inhale 1 puff every 6 hours as needed for shortness of breath 09/21/22  Yes NiFenton FoyNP  aspirin 81 MG chewable tablet Chew by mouth daily.   Yes [provider]  azithromycin (ZITHROMAX) 250 MG tablet Take 1 tablet (250 mg total) by mouth daily. Take first 2 tablets together, then 1 every day until finished. 10/23/22  Yes LeRex KrasPA  carvedilol (COREG) 25 MG tablet Take 1 tablet (25 mg total) by mouth 2 (two) times daily. 01/11/22  Yes Turner, TrEber HongMD  cetirizine (ZYRTEC) 10 MG tablet Take 1 tablet (10 mg total) by mouth daily. Patient taking differently: Take 10 mg by mouth daily as needed for allergies. 09/18/21  Yes Passmore, TeJake Church, NP  cyclobenzaprine (FLEXERIL) 5 MG tablet Take 1 tablet (5  mg total) by mouth 3 (three) times daily as needed for muscle spasms. 09/13/22  Yes Fenton Foy, NP  diclofenac Sodium (VOLTAREN) 1 % GEL Apply 4 g topically 4 (four) times daily. Patient taking differently: Apply 4 g topically 4 (four) times daily as needed (pain). 09/15/21  Yes Passmore, Jake Church I, NP  docusate sodium (COLACE) 100 MG capsule Take 1 capsule (100 mg total) by mouth 2 (two) times daily as needed. Patient taking differently: Take 100 mg by mouth 2 (two) times daily as needed (constipation). 09/08/21  Yes Passmore, Jake Church I, NP  escitalopram (LEXAPRO) 10 MG tablet Take 1 tablet (10 mg total) by mouth daily. 09/21/22  Yes Doda, Edd Arbour, MD  fluticasone (FLONASE) 50 MCG/ACT nasal spray Place 1 spray into both nostrils  daily. Patient taking differently: Place 1 spray into both nostrils daily as needed for allergies. 10/09/22  Yes Raulkar, Clide Deutscher, MD  furosemide (LASIX) 20 MG tablet Take 1 tablet (20 mg total) by mouth daily. 03/21/21 12/29/22 Yes Raulkar, Clide Deutscher, MD  hydrocortisone 2.5 % cream Apply topically 2 (two) times daily. Patient taking differently: Apply 1 Application topically daily as needed (itching). 01/22/22  Yes Passmore, Jake Church I, NP  hydrOXYzine (ATARAX) 25 MG tablet Take 1 tablet (25 mg total) by mouth 3 (three) times daily as needed. Patient taking differently: Take 25 mg by mouth 3 (three) times daily as needed for itching. 09/08/21  Yes Passmore, Tewana I, NP  ipratropium-albuterol (DUONEB) 0.5-2.5 (3) MG/3ML SOLN Take 3 mLs by nebulization every 6 (six) hours as needed. 10/23/22  Yes Rex Kras, PA  linaclotide [facility]) 72 MCG capsule Take 1 capsule (72 mcg total) by mouth daily before breakfast. 11/23/21  Yes Passmore, Jake Church I, NP  methocarbamol (ROBAXIN-750) 750 MG tablet Take 1 tablet (750 mg total) by mouth 4 (four) times daily. Patient taking differently: Take 750 mg by mouth every 6 (six) hours as needed for muscle spasms. 07/16/22  Yes Raulkar, Clide Deutscher, MD  nitroGLYCERIN (NITROSTAT) 0.4 MG SL tablet Place 1 tablet (0.4 mg total) under the tongue every 5 (five) minutes as needed for chest pain (max 3 doses, if no relief call 911). 09/10/22  Yes Conte, Tessa N, PA-C  olmesartan (BENICAR) 40 MG tablet Take 1 tablet (40 mg total) by mouth daily. 10/15/22  Yes Turner, Eber Hong, MD  spironolactone (ALDACTONE) 100 MG tablet Take 1 tablet (100 mg total) by mouth daily. 01/11/22  Yes Turner, Eber Hong, MD  Ciclopirox-Salicylic Acid XX123456 % SHAM Apply 1 Application topically 2 (two) times daily. Patient not taking: Reported on 10/23/2022 07/09/22   [name], MD  ibuprofen (ADVIL) 800 MG tablet Take 1 tablet (800 mg total) by mouth 3 (three) times daily. Patient not taking: Reported on 10/23/2022  12/18/21   Raulkar, Clide Deutscher, MD  montelukast (SINGULAIR) 10 MG tablet TAKE 1 TABLET (10 MG TOTAL) BY MOUTH AT BEDTIME. Patient not taking: Reported on 10/23/2022 08/02/20 09/10/22  [name], FNP  NONFORMULARY OR COMPOUNDED ITEM Diclofenac 3%, Gabapentin 5%, Lidocaine 5%, Menthol 1%.  Apply 1-2 pumps three to four times per day as needed Patient not taking: Reported on 10/23/2022 06/09/20   Raulkar, Clide Deutscher, MD      Allergies    Patient has no known allergies.    Review of Systems   Review of Systems  Cardiovascular:  Positive for chest pain.    Physical Exam Updated Vital Signs BP 121/65   Pulse (!) 105   Temp  97.6 F (36.4 C) (Oral)   Resp 20   LMP 04/03/2017 (Approximate)   SpO2 100%  Physical Exam Vitals and nursing note reviewed.  Constitutional:      Appearance: Normal appearance.  HENT:     Head: Normocephalic and atraumatic.     Mouth/Throat:     Mouth: Mucous membranes are moist.  Eyes:     General: No scleral icterus. Cardiovascular:     Rate and Rhythm: Normal rate and regular rhythm.     Pulses: Normal pulses.     Heart sounds: Normal heart sounds.  Pulmonary:     Effort: Pulmonary effort is normal.     Breath sounds: Normal breath sounds.  Abdominal:     General: Abdomen is flat.     Palpations: Abdomen is soft.     Tenderness: There is no abdominal tenderness.  Musculoskeletal:        General: No deformity.  Skin:    General: Skin is warm.     Findings: No rash.  Neurological:     General: No focal deficit present.     Mental Status: She is alert.  Psychiatric:        Mood and Affect: Mood normal.     ED Results / Procedures / Treatments   Labs (all labs ordered are listed, but only abnormal results are displayed) Labs Reviewed  CBC - Abnormal; Notable for the following components:      Result Value   WBC 10.9 (*)    RBC 3.04 (*)    Hemoglobin 9.5 (*)    HCT 32.2 (*)    MCV 105.9 (*)    MCHC 29.5 (*)    Platelets 141 (*)    All  other components within normal limits  BASIC METABOLIC PANEL - Abnormal; Notable for the following components:   Glucose, Bld 150 (*)    All other components within normal limits  RESP PANEL BY RT-PCR (RSV, FLU A&B, COVID)  RVPGX2  BRAIN NATRIURETIC PEPTIDE  POC OCCULT BLOOD, ED  TROPONIN I (HIGH SENSITIVITY)  TROPONIN I (HIGH SENSITIVITY)    EKG EKG Interpretation  Date/Time:  Tuesday October 23 2022 11:21:18 EST Ventricular Rate:  136 PR Interval:  159 QRS Duration: 83 QT Interval:  256 QTC Calculation: 385 R Axis:   1 Text Interpretation: Sinus tachycardia Left ventricular hypertrophy Confirmed by Dene Gentry [phone]) on 10/23/2022 11:22:31 AM  Radiology CT Angio Chest PE W and/or Wo Contrast  Result Date: 10/23/2022 CLINICAL DATA:  Chest pain. EXAM: CT ANGIOGRAPHY CHEST WITH CONTRAST TECHNIQUE: Multidetector CT imaging of the chest was performed using the standard protocol during bolus administration of intravenous contrast. Multiplanar CT image reconstructions and MIPs were obtained to evaluate the vascular anatomy. RADIATION DOSE REDUCTION: This exam was performed according to the departmental dose-optimization program which includes automated exposure control, adjustment of the mA and/or kV according to patient size and/or use of iterative reconstruction technique. CONTRAST:  36m OMNIPAQUE IOHEXOL 350 MG/ML SOLN COMPARISON:  Radiograph earlier today. FINDINGS: Cardiovascular: There are no filling defects within the pulmonary arteries to suggest pulmonary embolus. Aortic tortuosity, no aneurysm. Mild cardiomegaly. No significant pericardial effusion. Mediastinum/Nodes: Small hiatal hernia. No enlarged mediastinal or hilar lymph nodes. Thyroid gland appears slightly prominent in size but no discrete nodule. Lungs/Pleura: Moderate-sized airspace consolidation within the dependent and medial left lower lobe suspicious for pneumonia. Mild heterogeneous pulmonary parenchyma. Subsegmental  atelectasis within the anterior right middle lobe. No pleural fluid. Upper Abdomen: No acute upper abdominal  findings. Musculoskeletal: Thoracic spondylosis with anterior spurring. There are no acute or suspicious osseous abnormalities. No chest wall soft tissue abnormalities. Review of the MIP images confirms the above findings. IMPRESSION: 1. No pulmonary embolus. 2. Moderate-sized airspace consolidation in the dependent and medial left lower lobe suspicious for pneumonia. Recommend radiographic follow-up to resolution, although this is challenging to see on radiograph due to location and habitus. 3. Mild cardiomegaly. 4. Small hiatal hernia. Electronically Signed   By: Keith Rake M.D.   On: 10/23/2022 18:10   DG Chest 2 View  Result Date: 10/23/2022 CLINICAL DATA:  Chest pain EXAM: CHEST - 2 VIEW COMPARISON:  05/08/2014 FINDINGS: Cardiac silhouette is prominent. There is pulmonary interstitial prominence with vascular congestion. No focal consolidation. No pneumothorax or pleural effusion identified. There are thoracic degenerative changes. IMPRESSION: Findings suggest CHF. Electronically Signed   By: Sammie Bench M.D.   On: 10/23/2022 12:22    Procedures Procedures    Medications Ordered in ED Medications  ipratropium-albuterol (DUONEB) 0.5-2.5 (3) MG/3ML nebulizer solution 3 mL (3 mLs Nebulization Given 10/23/22 1147)  sodium chloride 0.9 % bolus 1,000 mL (0 mLs Intravenous Stopped 10/23/22 1246)  methylPREDNISolone sodium succinate (SOLU-MEDROL) 125 mg/2 mL injection 125 mg (125 mg Intravenous Given 10/23/22 1148)  oxyCODONE-acetaminophen (PERCOCET/ROXICET) 5-325 MG per tablet 1 tablet (1 tablet Oral Given 10/23/22 1226)  iohexol (OMNIPAQUE) 350 MG/ML injection 80 mL (80 mLs Intravenous Contrast Given 10/23/22 1743)    ED Course/ Medical Decision Making/ A&P                             Medical Decision Making Amount and/or Complexity of Data Reviewed Labs: ordered. Radiology:  ordered.  Risk Prescription drug management.   This patient presents to the ED for chest pain and shortness of breath, this involves an extensive number of treatment options, and is a complaint that carries with a high risk of complications and morbidity.  The differential diagnosis includes ACS/MI, asthma, COPD, pneumothorax, PE, pneumonia, COVID, flu, bronchitis, CHF, infectious etiology.  This is not an exhaustive list.  Lab tests: I ordered and personally interpreted labs.  The pertinent results include: WBC unremarkable. Hbg unremarkable. Platelets unremarkable. Electrolytes unremarkable. BUN, creatinine unremarkable.   Imaging studies: I ordered imaging studies. I personally reviewed, interpreted imaging and agree with the radiologist's interpretations. The results include: Chest x-ray with findings of chest CT HF.  CT angio chest showed evidence of pneumonia, no signs of PE.  Problem list/ ED course/ Critical interventions/ Medical management: HPI: See above Vital signs within normal range and stable throughout visit. Laboratory/imaging studies significant for: See above. On physical examination, patient is afebrile and appears in no acute distress. This patient presents with dyspnea, most likely secondary to pneumonia. Presentation not consistent with acute cardiac etiologies to include ACS (non ischemic ekg, unremarkable trop), CHF, pericardial effusion / tamponade . Presentation not consistent with acute respiratory etiologies to include acute PE ([facility] low risk), pneumothorax, COPD exacerbation, allergic etiologies, or infectious etiologies such as PNA. Presentation also not consistent with non-cardiopulmonary causes to include toxidromes, metabolic etiologies such as acidemia or electrolyte derangements, sepsis, neurologic causes (i.e. demyelinating diseases). Based on patient's clinical presentations and laboratory/imaging studies I suspect pneumonia I will send an Rx of azithromycin  and albuterol neb.  DuoNeb and Solu-Medrol ordered in ER.  Reevaluation of the patient after these medications showed that the patient improved.  Considered hospitalization with shared decision making, patient  would like outpatient follow-up. I have reviewed the patient home medicines and have made adjustments as needed.  Cardiac monitoring/EKG: The patient was maintained on a cardiac monitor.  I personally reviewed and interpreted the cardiac monitor which showed an underlying rhythm of: sinus rhythm.  Additional history obtained: External records from outside source obtained and reviewed including: Chart review including previous notes, labs, imaging.  Consultations obtained:  Disposition Continued outpatient therapy. Follow-up with PCP recommended for reevaluation of symptoms. Treatment plan discussed with patient.  Pt acknowledged understanding was agreeable to the plan. Worrisome signs and symptoms were discussed with patient, and patient acknowledged understanding to return to the ED if they noticed these signs and symptoms. Patient was stable upon discharge.   This chart was dictated using voice recognition software.  Despite best efforts to proofread,  errors can occur which can change the documentation meaning.          Final Clinical Impression(s) / ED Diagnoses Final diagnoses:  Pneumonia of left lower lobe due to infectious organism    Rx / DC Orders ED Discharge Orders          Ordered    ipratropium-albuterol (DUONEB) 0.5-2.5 (3) MG/3ML SOLN  Every 6 hours PRN        10/23/22 1834    azithromycin (ZITHROMAX) 250 MG tablet  Daily        10/23/22 1834              Rex Kras, PA 10/23/22 1836    Valarie Merino, MD 10/24/22 [phone]

## 2022-10-23 NOTE — ED Triage Notes (Signed)
Pt coming from home BIB [facility] for c/o central chest pain that started last night, radiating to her left flank. She felt SHOB this AM and used her inhaler twice which she reports helped with her SHOB. EMS gave 324 aspirin PTA, pt refused nitro due to chest pain getting better she states she did not want to deal with the headache.   BP 160/126 HR 138 96% room air

## 2022-10-23 NOTE — Discharge Instructions (Addendum)
Your CT scan show pneumonia today.  Please take your medications as prescribed. I recommend close follow-up with PCP for reevaluation.  Please do not hesitate to return to emergency department if worrisome signs symptoms we discussed become apparent.

## 2022-10-23 NOTE — ED Notes (Signed)
Pt ambulated to the restroom with no assistance.

## 2022-10-24 ENCOUNTER — Other Ambulatory Visit: Payer: Self-pay

## 2022-10-24 ENCOUNTER — Ambulatory Visit: Payer: Medicaid Other

## 2022-11-02 ENCOUNTER — Encounter (HOSPITAL_COMMUNITY): Payer: 59 | Admitting: Psychiatry

## 2022-11-09 NOTE — Therapy (Signed)
OUTPATIENT PHYSICAL THERAPY EVALUATION   Patient Name: Caroline Ryan MRN: [id number] DOB:September 23, 1965, 57 y.o., female Today's Date: 11/10/2022   END OF SESSION:  PT End of Session - 11/10/22 1003     Visit Number 1    Number of Visits 17    Date for PT Re-Evaluation 01/05/23    Authorization Type [employer] / [employer] MCD    Authorization - Number of Visits 27    PT Start Time 1005    PT Stop Time 1050    PT Time Calculation (min) 45 min    Activity Tolerance Patient tolerated treatment well    Behavior During Therapy WFL for tasks assessed/performed             Past Medical History:  Diagnosis Date   Anemia    Ascending aorta dilatation ([place])    46mm by echo 07/2021 and 83mm by echo 07/2022   Asthma    Depression    no meds   Eczema    Fibroid    Headache(784.0)    otc prn med   History of blood transfusion    Hypertension    Knee pain, bilateral    arthralgia knee pain bilateral   Lower extremity edema    Mitral regurgitation    Mild to moderate by echo 07/2021   Seasonal allergies    SVD (spontaneous vaginal delivery)    x 3   Vaginal polyp 05/2020   Past Surgical History:  Procedure Laterality Date   ABDOMINAL HYSTERECTOMY     BILATERAL SALPINGECTOMY Bilateral 10/10/2017   Procedure: BILATERAL SALPINGECTOMY;  Surgeon: Chancy Milroy, MD;  Location: [facility];  Service: Gynecology;  Laterality: Bilateral;   BREAST BIOPSY     US guided core 2009   BREAST EXCISIONAL BIOPSY     right 1982 left [place]     bilateral cysts/benign   COLONOSCOPY  10/17/2021   VAGINAL HYSTERECTOMY N/A 10/10/2017   Procedure: HYSTERECTOMY VAGINAL;  Surgeon: Chancy Milroy, MD;  Location: [facility];  Service: Gynecology;  Laterality: N/A;   Patient Active Problem List   Diagnosis Date Noted   Severe major depression, single episode ([place]) 08/21/2022   Chronic pain of both knees 05/16/2022   OSA (obstructive sleep apnea) 11/09/2021   Mitral regurgitation  09/07/2021   Dilated aortic root ([place]) 09/07/2021   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/2020   Unilateral primary osteoarthritis, left knee 10/22/2019   Unilateral primary osteoarthritis, right knee 10/22/2019   BMI 50.0-59.9, adult ([place]) 10/22/2019   Grieving 06/10/2019   Pruritus 06/10/2019   Lower extremity edema 11/02/2018   Post-operative state 10/10/2017   Symptomatic anemia 05/20/2017   Arthralgia of both knees 06/27/2015   BACK STRAIN, LUMBAR 03/16/2010   KNEE PAIN, BILATERAL 06/30/2009   OBESITY 04/13/2009   Edema 04/13/2009   FIBROADENOMA, BREAST 04/20/2008   HYPERTENSION, BENIGN ESSENTIAL 04/14/2008   Allergic rhinitis 04/14/2008   Asthma 04/14/2008    PCP: Ellery Plunk  REFERRING PROVIDER: [name], MD  REFERRING DIAG: Bilateral primary osteoarthritis of knee  THERAPY DIAG:  Chronic pain of both knees  Muscle weakness (generalized)  Other abnormalities of gait and mobility  Rationale for Evaluation and Treatment: Rehabilitation  ONSET DATE: Chronic   SUBJECTIVE:  SUBJECTIVE STATEMENT: Patient reports bilateral knee pain. She has had therapy before and the pool therapy really helped. She states that she has difficulty bending down or getting on knees, walking, standing, stairs because of her knee pain.  She states that she saw a vein specialist and was diagnosed with lipedema with more fatty tissue around her legs. She states she can't walk for long with activities like going to the store or the mall. She can walk around 15-20 minutes because she needs to rest. She gets constant popping in the knees and both knees can give out on her so she has been using a cane more often. 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  PERTINENT HISTORY: See PMH above  PAIN:  Are you having pain? Yes:  NPRS scale: 7/10 Pain location: Bilateral knee Pain description: Achy, burning Aggravating factors: Bending  down or getting on knees, walking, standing, stairs Relieving factors: Medication  PRECAUTIONS: None  WEIGHT BEARING RESTRICTIONS: No  FALLS:  Has patient fallen in last 6 months? No  LIVING ENVIRONMENT: Lives with: lives with their son Lives in: House/apartment Stairs: 3 flights of stairs, railing on right going up  PLOF: Independent  PATIENT GOALS: Pain relief and improve walking   OBJECTIVE:  PATIENT SURVEYS:  KOOS Jr.: 16/28  COGNITION: Overall cognitive status: Within functional limits for tasks assessed     SENSATION: WFL  POSTURE:   Varus knee alignment  PALPATION: Tenderness to parapatellar region bilaterally  LOWER EXTREMITY ROM:  Active ROM Right eval Left eval  Hip flexion    Hip extension    Hip abduction    Hip adduction    Hip internal rotation    Hip external rotation    Knee flexion 102 110  Knee extension 0 0  Ankle dorsiflexion    Ankle plantarflexion    Ankle inversion    Ankle eversion     (Blank rows = not tested)  LOWER EXTREMITY MMT:  MMT Right eval Left eval  Hip flexion    Hip extension    Hip abduction    Hip adduction    Hip internal rotation    Hip external rotation    Knee flexion 5 5  Knee extension 4 4  Ankle dorsiflexion    Ankle plantarflexion    Ankle inversion    Ankle eversion     (Blank rows = not tested)  FUNCTIONAL TESTS:  5 times sit to stand: 22 seconds 6 minute walk test: 700 ft  with 1 seated rest break for 30 sec at 3:20-2:50  GAIT: Distance walked: 700 ft Assistive device utilized: None Level of assistance: Complete Independence Comments: Trendelenburg, varus alignment   TODAY'S TREATMENT:          [facility] Adult PT Treatment:                                                DATE: 11/10/2022 Therapeutic Exercise: SLR x 10 each Bridge x 10 Sidelying hip abduction x 10 each LAQ x 10 each Sit to stand x 10  PATIENT EDUCATION:  Education details: Exam findings, POC, HEP Person educated:  Patient Education method: Explanation, Demonstration, Tactile cues, Verbal cues, and Handouts Education comprehension: verbalized understanding, returned demonstration, verbal cues required, tactile cues required, and needs further education  HOME EXERCISE PROGRAM: Access Code: [email or web address]    ASSESSMENT: CLINICAL IMPRESSION: Patient is a 57 y.o. female who was seen today for physical therapy evaluation and treatment for bilateral knee pain. She demonstrates limitations  with knee and hip strength with increased knee pain with weight bearing tasks that is impacting her walking tolerance and community access thus limiting her functional ability.   OBJECTIVE IMPAIRMENTS: Abnormal gait, decreased activity tolerance, decreased balance, difficulty walking, decreased ROM, decreased strength, improper body mechanics, and pain.   ACTIVITY LIMITATIONS: bending, standing, squatting, stairs, and locomotion level  PARTICIPATION LIMITATIONS: meal prep, cleaning, shopping, community activity, and occupation  PERSONAL FACTORS: Fitness, Past/current experiences, and Time since onset of injury/illness/exacerbation are also affecting patient's functional outcome.   REHAB POTENTIAL: Fair  CLINICAL DECISION MAKING: Stable/uncomplicated  EVALUATION COMPLEXITY: Low   GOALS: Goals reviewed with patient? Yes  SHORT TERM GOALS: Target date: 12/08/2022  Patient will be I with initial HEP in order to progress with therapy. Baseline: HEP provided at eval Goal status: INITIAL  2.  Patient will report knee pain </= 5/10 in order to reduce functional limitations Baseline:  Goal status: INITIAL  3.  Patient will perform 5xSTS </= 15 seconds to indicate improvement in LE strength and reduction of fall risk Baseline: 22 seconds Goal status: INITIAL  LONG TERM GOALS: Target date: 01/05/2023  Patient will be I with final HEP to maintain progress from PT. Baseline: HEP provided at eval Goal status: INITIAL  2.   Patient will report KOOS Jr </= 2/28 in order to indicate reduced pain and improvement in knee function Baseline: 16/28 Goal status: INITIAL  3.  Patient will demonstrate bilateral knee extension strength 5/5 MMT in order to improve walking tolerance reduce feeling of knees giving out Baseline: 4/5 MMT Goal status: INITIAL  4.  Patient will ambulate >/= 1000 ft with 6MWT in order to improve community access and ability to go shopping without limitation Baseline: 700 ft Goal status: INITIAL   PLAN: PT FREQUENCY: 1-2x/week  PT DURATION: 8 weeks  PLANNED INTERVENTIONS: Therapeutic exercises, Therapeutic activity, Neuromuscular re-education, Balance training, Gait training, Patient/Family education, Self Care, Joint mobilization, Aquatic Therapy, Dry Needling, Electrical stimulation, Cryotherapy, Moist heat, Ionotophoresis 4mg /ml Dexamethasone, Manual therapy, and Re-evaluation  PLAN FOR NEXT SESSION: Review HEP and progress PRN, progress quad and hip strengthening, progress to closed chain exercises, balance training   Hilda Blades, PT, DPT, LAT, ATC 11/10/22  11:08 AM Phone: [phone] Fax: [phone]   Check all possible CPT codes: 97164 - PT Re-evaluation, 97110- Therapeutic Exercise, [phone]- Neuro Re-education, [phone] - Gait Training, [phone] - Manual Therapy, [phone] - Therapeutic Activities, [phone] - Self Care, and [phone] - Aquatic therapy    Check all conditions that are expected to impact treatment: Morbid obesity and Musculoskeletal disorders   If treatment provided at initial evaluation, no treatment charged due to lack of authorization.

## 2022-11-10 ENCOUNTER — Other Ambulatory Visit: Payer: Self-pay

## 2022-11-10 ENCOUNTER — Ambulatory Visit: Payer: 59 | Attending: Physical Medicine and Rehabilitation | Admitting: Physical Therapy

## 2022-11-10 ENCOUNTER — Encounter: Payer: Self-pay | Admitting: Physical Therapy

## 2022-11-10 DIAGNOSIS — M6281 Muscle weakness (generalized): Secondary | ICD-10-CM | POA: Insufficient documentation

## 2022-11-10 DIAGNOSIS — M170 Bilateral primary osteoarthritis of knee: Secondary | ICD-10-CM | POA: Insufficient documentation

## 2022-11-10 DIAGNOSIS — G8929 Other chronic pain: Secondary | ICD-10-CM | POA: Diagnosis not present

## 2022-11-10 DIAGNOSIS — M25562 Pain in left knee: Secondary | ICD-10-CM | POA: Insufficient documentation

## 2022-11-10 DIAGNOSIS — R2689 Other abnormalities of gait and mobility: Secondary | ICD-10-CM | POA: Insufficient documentation

## 2022-11-10 DIAGNOSIS — M25561 Pain in right knee: Secondary | ICD-10-CM | POA: Insufficient documentation

## 2022-11-10 NOTE — Patient Instructions (Signed)
Access Code: [email or web address] URL: [email or web address] Date: 11/10/2022 Prepared by: Hilda Blades  Exercises - Active Straight Leg Raise with Quad Set  - 1 x daily - 7 x weekly - 2 sets - 10 reps - Bridge  - 1 x daily - 7 x weekly - 2 sets - 10 reps - Sidelying Hip Abduction  - 1 x daily - 7 x weekly - 2 sets - 10 reps - Seated Long Arc Quad  - 1 x daily - 7 x weekly - 2 sets - 10 reps - Sit to Stand with Arms Crossed  - 1 x daily - 7 x weekly - 2 sets - 10 reps

## 2022-11-12 ENCOUNTER — Other Ambulatory Visit: Payer: Self-pay

## 2022-11-12 ENCOUNTER — Ambulatory Visit (INDEPENDENT_AMBULATORY_CARE_PROVIDER_SITE_OTHER): Payer: 59 | Admitting: Nurse Practitioner

## 2022-11-12 ENCOUNTER — Encounter: Payer: Self-pay | Admitting: Nurse Practitioner

## 2022-11-12 VITALS — BP 112/63 | HR 90 | Temp 97.5°F | Wt 249.0 lb

## 2022-11-12 DIAGNOSIS — M25562 Pain in left knee: Secondary | ICD-10-CM | POA: Diagnosis not present

## 2022-11-12 DIAGNOSIS — M25561 Pain in right knee: Secondary | ICD-10-CM

## 2022-11-12 MED ORDER — KETOROLAC TROMETHAMINE 30 MG/ML IJ SOLN
30.0000 mg | Freq: Once | INTRAMUSCULAR | Status: AC
  Administered 2022-11-12: 30 mg via INTRAVENOUS

## 2022-11-12 MED ORDER — TRIAMCINOLONE ACETONIDE 0.5 % EX CREA
1.0000 | TOPICAL_CREAM | Freq: Three times a day (TID) | CUTANEOUS | 0 refills | Status: DC
  Filled 2022-11-12: qty 30, 28d supply, fill #0
  Filled 2022-11-21: qty 30, 10d supply, fill #0

## 2022-11-12 MED ORDER — CICLOPIROX OLAMINE 0.77 % EX CREA
TOPICAL_CREAM | Freq: Two times a day (BID) | CUTANEOUS | 0 refills | Status: DC
  Filled 2022-11-12: qty 15, 10d supply, fill #0
  Filled 2022-11-21: qty 15, 7d supply, fill #0

## 2022-11-12 NOTE — Progress Notes (Signed)
@Patient  ID: Caroline Ryan, female    DOB: September 07, 1965, 57 y.o.   MRN: [id number]  Chief Complaint  Patient presents with   Follow-up    Knee pain    Referring provider: Fenton Foy, NP  HPI  Caroline Ryan 57 y.o. female  has a past medical history of Anemia, Asthma, Depression, Dilated aortic root ([place]), Eczema, Fibroid, Headache(784.0), History of blood transfusion, Hypertension, Knee pain, bilateral, Lower extremity edema, Mitral regurgitation, Seasonal allergies, SVD (spontaneous vaginal delivery), and Vaginal polyp (05/2020).     Patient presents today for a follow-up.  She complains of continued ongoing knee pain.  She is currently having physical therapy and is followed by Ortho.  We will try to get her an appointment set up for pain clinic.  She will start her water aerobics here soon.  She has seen a vascular specialist for lymphedema.  We did discuss gluten-free diet to help with inflammation throughout the body today.  Will give handout for patient.  Denies f/c/s, n/v/d, hemoptysis, PND, leg swelling Denies chest pain or edema          No Known Allergies  Immunization History  Administered Date(s) Administered   COVID-19, mRNA, vaccine(Comirnaty)12 years and older 09/20/2022   Influenza Whole 06/30/2009   Influenza,inj,Quad PF,6+ Mos 06/27/2015, 07/04/2016, 06/09/2018, 06/10/2019   PFIZER(Purple Top)SARS-COV-2 Vaccination 12/25/2019, 01/15/2020   Pneumococcal Polysaccharide-23 04/14/2008, 06/27/2015   Td 10/18/2005   Tdap 07/04/2016    Past Medical History:  Diagnosis Date   Anemia    Ascending aorta dilatation ([facility])    64mm by echo 07/2021 and 61mm by echo 07/2022   Asthma    Depression    no meds   Eczema    Fibroid    Headache(784.0)    otc prn med   History of blood transfusion    Hypertension    Knee pain, bilateral    arthralgia knee pain bilateral   Lower extremity edema    Mitral regurgitation    Mild to moderate by echo  07/2021   Seasonal allergies    SVD (spontaneous vaginal delivery)    x 3   Vaginal polyp 05/2020    Tobacco History: Social History   Tobacco Use  Smoking Status Former   Packs/day: 0.25   Years: 2.00   Additional pack years: 0.00   Total pack years: 0.50   Types: Cigarettes   Quit date: 08/20/2014   Years since quitting: 8.2  Smokeless Tobacco Never   Counseling given: Not Answered   Outpatient Encounter Medications as of 11/12/2022  Medication Sig   acetaminophen (TYLENOL) 325 MG tablet Take 650 mg by mouth daily as needed for moderate pain, headache or fever.   albuterol (VENTOLIN HFA) 108 (90 Base) MCG/ACT inhaler Inhale 1 puff every 6 hours as needed for shortness of breath   aspirin 81 MG chewable tablet Chew by mouth daily.   carvedilol (COREG) 25 MG tablet Take 1 tablet (25 mg total) by mouth 2 (two) times daily.   cetirizine (ZYRTEC) 10 MG tablet Take 1 tablet (10 mg total) by mouth daily. (Patient taking differently: Take 10 mg by mouth daily as needed for allergies.)   ciclopirox (LOPROX) 0.77 % cream Apply topically 2 (two) times daily.   cyclobenzaprine (FLEXERIL) 5 MG tablet Take 1 tablet (5 mg total) by mouth 3 (three) times daily as needed for muscle spasms.   diclofenac Sodium (VOLTAREN) 1 % GEL Apply 4 g topically 4 (four) times daily. (Patient  taking differently: Apply 4 g topically 4 (four) times daily as needed (pain).)   docusate sodium (COLACE) 100 MG capsule Take 1 capsule (100 mg total) by mouth 2 (two) times daily as needed. (Patient taking differently: Take 100 mg by mouth 2 (two) times daily as needed (constipation).)   escitalopram (LEXAPRO) 10 MG tablet Take 1 tablet (10 mg total) by mouth daily.   fluticasone (FLONASE) 50 MCG/ACT nasal spray Place 1 spray into both nostrils daily. (Patient taking differently: Place 1 spray into both nostrils daily as needed for allergies.)   furosemide (LASIX) 20 MG tablet Take 1 tablet (20 mg total) by mouth daily.    hydrocortisone 2.5 % cream Apply topically 2 (two) times daily. (Patient taking differently: Apply 1 Application topically daily as needed (itching).)   hydrOXYzine (ATARAX) 25 MG tablet Take 1 tablet (25 mg total) by mouth 3 (three) times daily as needed. (Patient taking differently: Take 25 mg by mouth 3 (three) times daily as needed for itching.)   ipratropium-albuterol (DUONEB) 0.5-2.5 (3) MG/3ML SOLN Take 3 mLs by nebulization every 6 (six) hours as needed.   linaclotide (LINZESS) 72 MCG capsule Take 1 capsule (72 mcg total) by mouth daily before breakfast.   methocarbamol (ROBAXIN-750) 750 MG tablet Take 1 tablet (750 mg total) by mouth 4 (four) times daily. (Patient taking differently: Take 750 mg by mouth every 6 (six) hours as needed for muscle spasms.)   nitroGLYCERIN (NITROSTAT) 0.4 MG SL tablet Place 1 tablet (0.4 mg total) under the tongue every 5 (five) minutes as needed for chest pain (max 3 doses, if no relief call 911).   olmesartan (BENICAR) 40 MG tablet Take 1 tablet (40 mg total) by mouth daily.   Respiratory Therapy Supplies (NEBULIZER MASK ADULT) MISC Use this mask with your nebulizer.   spironolactone (ALDACTONE) 100 MG tablet Take 1 tablet (100 mg total) by mouth daily.   triamcinolone cream (KENALOG) 0.5 % Apply 1 Application topically 3 (three) times daily.   azithromycin (ZITHROMAX) 250 MG tablet Take 1 tablet (250 mg total) by mouth daily. Take first 2 tablets together, then 1 every day until finished. (Patient not taking: Reported on 11/12/2022)   benzonatate (TESSALON) 100 MG capsule Take 1 capsule (100 mg total) by mouth every 8 (eight) hours. (Patient not taking: Reported on 11/12/2022)   Ciclopirox-Salicylic Acid XX123456 % SHAM Apply 1 Application topically 2 (two) times daily. (Patient not taking: Reported on 10/23/2022)   ibuprofen (ADVIL) 800 MG tablet Take 1 tablet (800 mg total) by mouth 3 (three) times daily. (Patient not taking: Reported on 10/23/2022)   NONFORMULARY  OR COMPOUNDED ITEM Diclofenac 3%, Gabapentin 5%, Lidocaine 5%, Menthol 1%.  Apply 1-2 pumps three to four times per day as needed (Patient not taking: Reported on 10/23/2022)   [DISCONTINUED] montelukast (SINGULAIR) 10 MG tablet TAKE 1 TABLET (10 MG TOTAL) BY MOUTH AT BEDTIME. (Patient not taking: Reported on 10/23/2022)   Facility-Administered Encounter Medications as of 11/12/2022  Medication   [COMPLETED] ketorolac (TORADOL) 30 MG/ML injection 30 mg   Triamcinolone Acetonide (ZILRETTA) intra-articular injection 32 mg   Triamcinolone Acetonide (ZILRETTA) intra-articular injection 32 mg     Review of Systems  Review of Systems  Constitutional: Negative.   HENT: Negative.    Cardiovascular: Negative.   Gastrointestinal: Negative.   Musculoskeletal:        Knee pain  Allergic/Immunologic: Negative.   Neurological: Negative.   Psychiatric/Behavioral: Negative.         Physical Exam  BP 112/63   Pulse 90   Temp (!) 97.5 F (36.4 C)   Wt 249 lb (112.9 kg)   LMP 04/03/2017 (Approximate)   SpO2 100%   BMI 47.05 kg/m   Wt Readings from Last 5 Encounters:  11/12/22 249 lb (112.9 kg)  10/09/22 250 lb (113.4 kg)  09/10/22 250 lb 6.4 oz (113.6 kg)  07/09/22 255 lb 12.8 oz (116 kg)  05/14/22 252 lb 9.6 oz (114.6 kg)     Physical Exam Vitals and nursing note reviewed.  Constitutional:      General: She is not in acute distress.    Appearance: She is well-developed.  Cardiovascular:     Rate and Rhythm: Normal rate and regular rhythm.  Pulmonary:     Effort: Pulmonary effort is normal.     Breath sounds: Normal breath sounds.  Neurological:     Mental Status: She is alert and oriented to person, place, and time.      Lab Results:  CBC    Component Value Date/Time   WBC 10.9 (H) 10/23/2022 1119   RBC 3.04 (L) 10/23/2022 1119   HGB 9.5 (L) 10/23/2022 1119   HGB 11.0 (L) 05/14/2022 1608   HCT 32.2 (L) 10/23/2022 1119   HCT 34.4 05/14/2022 1608   PLT 141 (L)  10/23/2022 1119   PLT 183 05/14/2022 1608   MCV 105.9 (H) 10/23/2022 1119   MCV 96 05/14/2022 1608   MCH 31.3 10/23/2022 1119   MCHC 29.5 (L) 10/23/2022 1119   RDW 13.1 10/23/2022 1119   RDW 11.7 05/14/2022 1608   LYMPHSABS 1.7 12/24/2018 1404   MONOABS 558 03/14/2017 1515   EOSABS 0.1 12/24/2018 1404   BASOSABS 0.1 12/24/2018 1404    BMET    Component Value Date/Time   NA 135 10/23/2022 1311   NA 142 05/14/2022 1608   K 4.2 10/23/2022 1311   CL 104 10/23/2022 1311   CO2 22 10/23/2022 1311   GLUCOSE 150 (H) 10/23/2022 1311   BUN 19 10/23/2022 1311   BUN 24 05/14/2022 1608   CREATININE 0.85 10/23/2022 1311   CREATININE 0.75 06/20/2017 1201   CALCIUM 9.2 10/23/2022 1311   GFRNONAA >60 10/23/2022 1311   GFRNONAA 91 06/20/2017 1201   GFRAA 98 02/16/2020 1114   GFRAA 105 06/20/2017 1201    BNP    Component Value Date/Time   BNP 95.5 10/23/2022 1119    ProBNP No results found for: "PROBNP"  Imaging: CT Angio Chest PE W and/or Wo Contrast  Result Date: 10/23/2022 CLINICAL DATA:  Chest pain. EXAM: CT ANGIOGRAPHY CHEST WITH CONTRAST TECHNIQUE: Multidetector CT imaging of the chest was performed using the standard protocol during bolus administration of intravenous contrast. Multiplanar CT image reconstructions and MIPs were obtained to evaluate the vascular anatomy. RADIATION DOSE REDUCTION: This exam was performed according to the departmental dose-optimization program which includes automated exposure control, adjustment of the mA and/or kV according to patient size and/or use of iterative reconstruction technique. CONTRAST:  15mL OMNIPAQUE IOHEXOL 350 MG/ML SOLN COMPARISON:  Radiograph earlier today. FINDINGS: Cardiovascular: There are no filling defects within the pulmonary arteries to suggest pulmonary embolus. Aortic tortuosity, no aneurysm. Mild cardiomegaly. No significant pericardial effusion. Mediastinum/Nodes: Small hiatal hernia. No enlarged mediastinal or hilar lymph  nodes. Thyroid gland appears slightly prominent in size but no discrete nodule. Lungs/Pleura: Moderate-sized airspace consolidation within the dependent and medial left lower lobe suspicious for pneumonia. Mild heterogeneous pulmonary parenchyma. Subsegmental atelectasis within the anterior right middle lobe.  No pleural fluid. Upper Abdomen: No acute upper abdominal findings. Musculoskeletal: Thoracic spondylosis with anterior spurring. There are no acute or suspicious osseous abnormalities. No chest wall soft tissue abnormalities. Review of the MIP images confirms the above findings. IMPRESSION: 1. No pulmonary embolus. 2. Moderate-sized airspace consolidation in the dependent and medial left lower lobe suspicious for pneumonia. Recommend radiographic follow-up to resolution, although this is challenging to see on radiograph due to location and habitus. 3. Mild cardiomegaly. 4. Small hiatal hernia. Electronically Signed   By: Keith Rake M.D.   On: 10/23/2022 18:10   DG Chest 2 View  Result Date: 10/23/2022 CLINICAL DATA:  Chest pain EXAM: CHEST - 2 VIEW COMPARISON:  05/08/2014 FINDINGS: Cardiac silhouette is prominent. There is pulmonary interstitial prominence with vascular congestion. No focal consolidation. No pneumothorax or pleural effusion identified. There are thoracic degenerative changes. IMPRESSION: Findings suggest CHF. Electronically Signed   By: Sammie Bench M.D.   On: 10/23/2022 12:22     Assessment & Plan:   Arthralgia of both knees - ketorolac (TORADOL) 30 MG/ML injection 30 mg   Follow up:  Follow up in 6 months     Fenton Foy, NP 11/12/2022

## 2022-11-12 NOTE — Assessment & Plan Note (Signed)
-   ketorolac (TORADOL) 30 MG/ML injection 30 mg   Follow up:  Follow up in 6 months

## 2022-11-12 NOTE — Patient Instructions (Addendum)
1. Arthralgia of both lower legs  - ketorolac (TORADOL) 30 MG/ML injection 30 mg   Follow up:  Follow up in 6 months     Gluten-Free Diet  The gluten-free diet includes all foods that do not contain gluten. Gluten is a protein that is found in wheat, rye, barley, and some other grains.  Following the gluten-free diet requires some planning. It can be challenging at first, but it gets easier with time and practice. There are more gluten-free options available today than ever before. If you need help finding gluten-free foods or if you have questions, talk with your dietitian or health care provider. What are tips for following this plan?  Reading food labels Read all food labels. Gluten is often added to foods. Always check the ingredient list and look for warnings that the food may contain gluten. Foods that list any of these key words on the label usually contain gluten: Wheat, flour, enriched flour, bromated flour, white flour, durum flour, graham flour, phosphated flour, self-rising flour, semolina, farina, barley (malt), rye, and oats. Starch, dextrin, modified food starch, or cereal. Thickening, fillers, or emulsifiers. Malt flavoring, malt extract, or malt syrup. Hydrolyzed vegetable protein. In the [place] packaged foods that are gluten-free are required to be labeled "GF." These foods should be easy to identify and are safe to eat. In the [place] food companies are also required to list common food allergens, including wheat, on their labels. Shopping When grocery shopping, start in the produce, meat, and dairy sections. These areas are more likely to contain gluten-free foods. Then move to the aisles that contain packaged foods if you need to. Meal planning All fruits, vegetables, and meats are safe to eat and do not contain gluten. Talk with your dietitian or health care provider before taking a gluten-free multivitamin or mineral supplement. Be aware of gluten-free foods having  contact with foods that contain gluten (cross-contamination). This can happen at home and with any processed foods. Talk with your health care provider or dietitian about how to reduce the risk of cross-contamination in your home. If you have questions about how a food is processed, ask the manufacturer. What foods can I eat? Fruits All plain fresh, frozen, canned, and dried fruits, and 100% fruit juices. Vegetables All plain fresh, frozen, and canned vegetables, and 100% vegetable juices. Grains Amaranth, bean flours, 100% buckwheat flour, corn, millet, nut flours or nut meals, GF oats, quinoa, rice, sorghum, teff, rice wafers, pure cornmeal tortillas, popcorn, and hot cereals made from cornmeal or GF grains. Hominy, rice, and wild rice. Some Asian rice noodles or bean noodles. Arrowroot starch, corn bran, corn flour, corn germ, cornmeal, corn starch, potato flour, potato starch flour, and rice bran. Plain, brown, and sweet rice flours. Rice polish, soy flour, and tapioca starch. Meats and other protein foods All fresh beef, pork, poultry, fish, seafood, and eggs. Fish canned in water, oil, brine, or vegetable broth. Plain nuts and seeds, peanut butter.  Some precooked or cured meat, such as sausages or meat loaves. Some frankfurters. Dried beans, dried peas, and lentils. Dairy Fresh plain, dry, evaporated, or condensed milk. Cream, butter, sour cream, whipping cream, and most yogurts. Unprocessed cheese, most processed cheeses, some cottage cheeses, and some cream cheeses. Beverages Coffee, tea, and most herbal teas. Carbonated beverages and some root beers. Wine, sake, and pure distilled spirits, such as gin, vodka, and whiskey. Most hard ciders. Fats and oils Butter, margarine, vegetable oil, hydrogenated butter, olive oil, shortening, lard, cream, and  some mayonnaise. Some commercial salad dressings. Olives. Sweets and desserts Sugar, honey, some syrups, molasses, jelly, and jam. Plain  hard candy, marshmallows, and gumdrops.  Pure cocoa powder. Plain chocolate. Custard and some pudding mixes. Gelatin desserts, sorbets, frozen ice pops, and sherbet.  Cake, cookies, and other desserts prepared with allowed flours. Some commercial ice creams. Cornstarch, tapioca, and rice puddings. Seasoning and other foods Some canned or frozen soups. Monosodium glutamate (MSG). Cider, rice, and wine vinegar. Baking soda and baking powder.  Cream of tartar. Baking and nutritional yeast. Certain soy sauces made without wheat. Ask your dietitian about specific brands that are allowed. Nuts, coconut, and chocolate. Salt, pepper, herbs, spices, flavoring extracts, imitation or artificial flavorings, natural flavorings, and food colorings.  Some medicines and supplements. Rice syrups. The items listed above may not be a complete list of foods and beverages you can eat and drink. Contact a dietitian for more information. What foods should I avoid? Fruits Thickened or prepared fruits and some pie fillings. Some fruit snacks and fruit roll-ups. Vegetables Most creamed vegetables and most vegetables canned in sauces. Some commercially prepared vegetables and salads. Vegetables in a soy sauce marinade or dressing. Grains Barley, bran, bulgur, couscous, cracked wheat, [place], farro, graham, malt, matzo, semolina, wheat germ, and all wheat and rye cereals, including spelt and kamut. Cereals containing malt as a flavoring, such as rice cereal. Noodles, spaghetti, macaroni, most packaged rice mixes, and all mixes containing wheat, rye, barley, or triticale. Meats and other protein foods Any meat or meat alternative containing wheat, rye, barley, or gluten stabilizers. These are often marinated or packaged meats, and precooked or cured meat, such as sausages or meat loaves. Bread-containing products, such as Swiss steak, croquettes, meatballs, and meatloaf. Most tuna canned in vegetable broth. [place] with  hydrolyzed vegetable protein (HVP) injected as part of the basting. Seitan. Imitation fish. Eggs in sauces made from ingredients to avoid. Dairy Commercial chocolate milk drinks and malted milk. Some non-dairy creamers. Any cheese product containing ingredients to avoid. Beverages Certain cereal beverages. Beer, ale, malted milk, and some root beers. Some hard ciders. Some instant flavored coffees. Some herbal teas made with barley or with barley malt added. Fats and oils Some commercial salad dressings. Sour cream containing modified food starch. Sweets and desserts Some toffees. Chocolate-coated nuts (may be rolled in wheat flour) and some commercial candies and candy bars.  Most cakes, cookies, donuts, pastries, and other baked goods. Some commercial ice cream. Ice cream cones.  Commercially prepared mixes for cakes, cookies, and other desserts. Bread pudding and other puddings thickened with flour.  Products containing brown rice syrup made with barley malt enzyme. Desserts and sweets made with malt flavoring. Seasoning and other foods Some curry powders, some dry seasoning mixes, some gravy extracts, some meat sauces, some ketchups, some prepared mustards, and horseradish.  Certain soy sauces. Malt vinegar. Bouillon and bouillon cubes that contain HVP. Some chip dips. Some chewing gum. Yeast extract. Brewer's yeast. Caramel color.  Some medicines and supplements. The items listed above may not be a complete list of foods and beverages you should avoid. Contact a dietitian for more information. Summary Gluten is a protein that is found in wheat, rye, barley, and some other grains. The gluten-free diet includes all foods that do not contain gluten. If you need help finding gluten-free foods or if you have questions, talk with your dietitian or your health care provider. Read all food labels. Gluten is often added to foods. Always check  the ingredient list and look for warnings that the food  may contain gluten. This information is not intended to replace advice given to you by your health care provider. Make sure you discuss any questions you have with your health care provider. Document Revised: 06/27/2021 Document Reviewed: 06/27/2021 Elsevier Patient Education  [place].

## 2022-11-14 ENCOUNTER — Other Ambulatory Visit: Payer: Self-pay

## 2022-11-14 ENCOUNTER — Ambulatory Visit (INDEPENDENT_AMBULATORY_CARE_PROVIDER_SITE_OTHER): Payer: 59 | Admitting: Mental Health

## 2022-11-14 DIAGNOSIS — F411 Generalized anxiety disorder: Secondary | ICD-10-CM | POA: Diagnosis not present

## 2022-11-14 DIAGNOSIS — F322 Major depressive disorder, single episode, severe without psychotic features: Secondary | ICD-10-CM

## 2022-11-14 NOTE — Progress Notes (Signed)
   THERAPIST PROGRESS NOTE  Session Time: 10: 08 am ( 52 minutes)  Participation Level: Active  Behavioral Response: CasualAlertAdequate   Type of Therapy: Individual Therapy  Treatment Goals addressed: STG: Amillya will decrease sxs of depression AEB development of x 3 effective coping skills with ability to reframe distorted thinking patterns within the next x 6 months    ProgressTowards Goals: Progressing  Interventions: CBT and Supportive  Summary:  Caroline Ryan is a 57 y.o. female who presents with dx of major depression single severe. Caroline Ryan presents alert and oriented; mood and affect euthymic, stable. Speech clear and coherent at normal rate and tone. Engaged and receptive to interventions. Shares improvement with sxs and reports use of coping skills. Shares for medications to be helpful and has been able to engage more with friends and family with improved relationships. Shares event of becoming ill and having pneumonia and having to present to ED. Shares to have also found out she has lymphedema and engaging in treatment for that as well. Shares ongoing disability case and waiting to hear back. Notes to feel as if she is doing better and depression has decreased. Notes to have received letter from [facility] and in need of refilling out application. Shares concern for ex and being incarcerated but working to focus on herself and her healing. Shares high degree of doctors appointments but working to remain in good spirits. Denies SI/HI. Decrease in depressive sxs noted; shares for thoughts to be balanced and positive and working to be future oriented. Progress with goals noted.   Suicidal/Homicidal: Nowithout intent/plan  Therapist Response: Therapist engaged Caroline Ryan in therapy session. Completed check in and assessed for current level of functioning, sxs management and level of stress. Provided safe space for Caroline Ryan to share thoughts and feelings; providing supportive feedback. Validated  feelings. Engaged Caroline Ryan in exploration of factors that have contributed to increase level of functioning. Explored ability to navigate stressors and incorporate balance in self-care in daily life to increase quality of life provided ongoing medical concerns. Explored use of coping skills and ability to socialize and engage with natural supports. Provided print out of SKAT information and encouraged to complete. Reviewed session, provided follow up. Assessed for safety concerns.   Plan: Return again in  x 4 weeks.  Diagnosis: GAD (generalized anxiety disorder)  Severe major depression, single episode ([place])  Collaboration of Care: Other None  Patient/Guardian was advised Release of Information must be obtained prior to any record release in order to collaborate their care with an outside provider. Patient/Guardian was advised if they have not already done so to contact the registration department to sign all necessary forms in order for [place] to release information regarding their care.   Consent: Patient/Guardian gives verbal consent for treatment and assignment of benefits for services provided during this visit. Patient/Guardian expressed understanding and agreed to proceed.   Caroline Ryan, [facility] 11/14/2022

## 2022-11-15 ENCOUNTER — Other Ambulatory Visit: Payer: Self-pay

## 2022-11-15 ENCOUNTER — Other Ambulatory Visit (HOSPITAL_COMMUNITY): Payer: Self-pay

## 2022-11-19 ENCOUNTER — Other Ambulatory Visit: Payer: Self-pay

## 2022-11-21 ENCOUNTER — Other Ambulatory Visit: Payer: Self-pay

## 2022-11-22 ENCOUNTER — Other Ambulatory Visit: Payer: Self-pay

## 2022-11-27 NOTE — Therapy (Signed)
OUTPATIENT PHYSICAL THERAPY EVALUATION   Patient Name: Caroline Ryan MRN: [id number] DOB:11-14-1965, 57 y.o., female Today's Date: 11/27/2022   END OF SESSION:    Past Medical History:  Diagnosis Date   Anemia    Ascending aorta dilatation (HCC)    65mm by echo 07/2021 and 97mm by echo 07/2022   Asthma    Depression    no meds   Eczema    Fibroid    Headache(784.0)    otc prn med   History of blood transfusion    Hypertension    Knee pain, bilateral    arthralgia knee pain bilateral   Lower extremity edema    Mitral regurgitation    Mild to moderate by echo 07/2021   Seasonal allergies    SVD (spontaneous vaginal delivery)    x 3   Vaginal polyp 05/2020   Past Surgical History:  Procedure Laterality Date   ABDOMINAL HYSTERECTOMY     BILATERAL SALPINGECTOMY Bilateral 10/10/2017   Procedure: BILATERAL SALPINGECTOMY;  Surgeon: Hermina Staggers, MD;  Location: [facility];  Service: Gynecology;  Laterality: Bilateral;   BREAST BIOPSY     US guided core 2009   BREAST EXCISIONAL BIOPSY     right 1982 left 1981   BREAST SURGERY     bilateral cysts/benign   COLONOSCOPY  10/17/2021   VAGINAL HYSTERECTOMY N/A 10/10/2017   Procedure: HYSTERECTOMY VAGINAL;  Surgeon: Hermina Staggers, MD;  Location: [facility];  Service: Gynecology;  Laterality: N/A;   Patient Active Problem List   Diagnosis Date Noted   Severe major depression, single episode 08/21/2022   Chronic pain of both knees 05/16/2022   OSA (obstructive sleep apnea) 11/09/2021   Mitral regurgitation 09/07/2021   Dilated aortic root 09/07/2021   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/2020   Unilateral primary osteoarthritis, left knee 10/22/2019   Unilateral primary osteoarthritis, right knee 10/22/2019   BMI 50.0-59.9, adult 10/22/2019   Grieving 06/10/2019   Pruritus 06/10/2019   Lower extremity edema 11/02/2018   Post-operative state 10/10/2017   Symptomatic anemia 05/20/2017   Arthralgia of both knees 06/27/2015    BACK STRAIN, LUMBAR 03/16/2010   KNEE PAIN, BILATERAL 06/30/2009   OBESITY 04/13/2009   Edema 04/13/2009   FIBROADENOMA, BREAST 04/20/2008   HYPERTENSION, BENIGN ESSENTIAL 04/14/2008   Allergic rhinitis 04/14/2008   Asthma 04/14/2008    PCP: Warrick Parisian  REFERRING PROVIDER: [name], MD  REFERRING DIAG: Bilateral primary osteoarthritis of knee  THERAPY DIAG:  No diagnosis found.  Rationale for Evaluation and Treatment: Rehabilitation  ONSET DATE: Chronic   SUBJECTIVE:  SUBJECTIVE STATEMENT: Patient reports bilateral knee pain. She has had therapy before and the pool therapy really helped. She states that she has difficulty bending down or getting on knees, walking, standing, stairs because of her knee pain. She states that she saw a vein specialist and was diagnosed with lipedema with more fatty tissue around her legs. She states she can't walk for long with activities like going to the store or the mall. She can walk around 15-20 minutes because she needs to rest. She gets constant popping in the knees and both knees can give out on her so she has been using a cane more often. 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  PERTINENT HISTORY: See PMH above  PAIN:  Are you having pain? Yes:  NPRS scale: 7/10 Pain location: Bilateral  knee Pain description: Achy, burning Aggravating factors: Bending down or getting on knees, walking, standing, stairs Relieving factors: Medication  PRECAUTIONS: None  WEIGHT BEARING RESTRICTIONS: No  FALLS:  Has patient fallen in last 6 months? No  LIVING ENVIRONMENT: Lives with: lives with their son Lives in: House/apartment Stairs: 3 flights of stairs, railing on right going up  PLOF: Independent  PATIENT GOALS: Pain relief and improve walking   OBJECTIVE:  PATIENT SURVEYS:  KOOS Jr.: 16/28  COGNITION: Overall cognitive status: Within functional limits  for tasks assessed     SENSATION: WFL  POSTURE:   Varus knee alignment  PALPATION: Tenderness to parapatellar region bilaterally  LOWER EXTREMITY ROM:  Active ROM Right eval Left eval  Hip flexion    Hip extension    Hip abduction    Hip adduction    Hip internal rotation    Hip external rotation    Knee flexion 102 110  Knee extension 0 0  Ankle dorsiflexion    Ankle plantarflexion    Ankle inversion    Ankle eversion     (Blank rows = not tested)  LOWER EXTREMITY MMT:  MMT Right eval Left eval  Hip flexion    Hip extension    Hip abduction    Hip adduction    Hip internal rotation    Hip external rotation    Knee flexion 5 5  Knee extension 4 4  Ankle dorsiflexion    Ankle plantarflexion    Ankle inversion    Ankle eversion     (Blank rows = not tested)  FUNCTIONAL TESTS:  5 times sit to stand: 22 seconds 6 minute walk test: 700 ft  with 1 seated rest break for 30 sec at 3:20-2:50  GAIT: Distance walked: 700 ft Assistive device utilized: None Level of assistance: Complete Independence Comments: Trendelenburg, varus alignment   TODAY'S TREATMENT:    [facility] Adult PT Treatment:                                                DATE: 11-28-22        [facility]                                                [facility] Therapeutic Exercise: SLR x 10 each Bridge x 10 Sidelying hip abduction x 10 each LAQ x 10 each Sit to stand x 10  PATIENT EDUCATION:  Education details: Exam findings, POC, HEP Person educated: Patient Education method: Explanation, Demonstration, Tactile cues, Verbal cues, and Handouts Education comprehension: verbalized understanding, returned demonstration, verbal cues required, tactile cues required, and needs further education  HOME EXERCISE PROGRAM: Access Code: [email or web address]    ASSESSMENT: CLINICAL IMPRESSION: Patient is a 57 y.o. female who was seen today for physical therapy evaluation and treatment for  bilateral knee pain. She demonstrates limitations with knee and hip strength with increased knee pain with weight bearing tasks that is impacting her walking tolerance and community access thus limiting her functional ability.   OBJECTIVE IMPAIRMENTS: Abnormal gait, decreased activity tolerance, decreased balance, difficulty walking, decreased ROM, decreased strength, improper body mechanics, and pain.   ACTIVITY LIMITATIONS: bending, standing, squatting, stairs, and locomotion level  PARTICIPATION LIMITATIONS: meal prep, cleaning, shopping, community activity, and occupation  PERSONAL FACTORS: Fitness, Past/current experiences, and Time since onset of injury/illness/exacerbation are also affecting patient's functional outcome.   REHAB POTENTIAL: Fair  CLINICAL DECISION MAKING: Stable/uncomplicated  EVALUATION COMPLEXITY: Low   GOALS: Goals reviewed with patient? Yes  SHORT TERM GOALS: Target date: 12/08/2022  Patient will be I with initial HEP in order to progress with therapy. Baseline: HEP provided at eval Goal status: INITIAL  2.  Patient will report knee pain </= 5/10 in order to reduce functional limitations Baseline:  Goal status: INITIAL  3.  Patient will perform 5xSTS </= 15 seconds to indicate improvement in LE strength and reduction of fall risk Baseline: 22 seconds Goal status: INITIAL  LONG TERM GOALS: Target date: 01/05/2023  Patient will be I with final HEP to maintain progress from PT. Baseline: HEP provided at eval Goal status: INITIAL  2.  Patient will report KOOS Jr </= 2/28 in order to indicate reduced pain and improvement in knee function Baseline: 16/28 Goal status: INITIAL  3.  Patient will demonstrate bilateral knee extension strength 5/5 MMT in order to improve walking tolerance reduce feeling of knees giving out Baseline: 4/5 MMT Goal status: INITIAL  4.  Patient will ambulate >/= 1000 ft with in order to improve community access and  ability to go shopping without limitation Baseline: 700 ft Goal status: INITIAL   PLAN: PT FREQUENCY: 1-2x/week  PT DURATION: 8 weeks  PLANNED INTERVENTIONS: Therapeutic exercises, Therapeutic activity, Neuromuscular re-education, Balance training, Gait training, Patient/Family education, Self Care, Joint mobilization, Aquatic Therapy, Dry Needling, Electrical stimulation, Cryotherapy, Moist heat, Ionotophoresis 4mg /ml Dexamethasone, Manual therapy, and Re-evaluation  PLAN FOR NEXT SESSION: Review HEP and progress PRN, progress quad and hip strengthening, progress to closed chain exercises, balance training   ***   Check all possible CPT codes: 85929 - PT Re-evaluation, 97110- Therapeutic Exercise, [phone]- Neuro Re-education, [phone] - Gait Training, [phone] - Manual Therapy, 97530 - Therapeutic Activities, [phone] - Self Care, and [phone] - Aquatic therapy    Check all conditions that are expected to impact treatment: Morbid obesity and Musculoskeletal disorders   If treatment provided at initial evaluation, no treatment charged due to lack of authorization.

## 2022-11-28 ENCOUNTER — Ambulatory Visit: Payer: 59 | Attending: Physical Medicine and Rehabilitation | Admitting: Physical Therapy

## 2022-11-28 ENCOUNTER — Encounter: Payer: Self-pay | Admitting: Physical Therapy

## 2022-11-28 DIAGNOSIS — M6281 Muscle weakness (generalized): Secondary | ICD-10-CM | POA: Insufficient documentation

## 2022-11-28 DIAGNOSIS — G8929 Other chronic pain: Secondary | ICD-10-CM | POA: Diagnosis not present

## 2022-11-28 DIAGNOSIS — R2689 Other abnormalities of gait and mobility: Secondary | ICD-10-CM | POA: Diagnosis not present

## 2022-11-28 DIAGNOSIS — M25561 Pain in right knee: Secondary | ICD-10-CM | POA: Insufficient documentation

## 2022-11-28 DIAGNOSIS — M25562 Pain in left knee: Secondary | ICD-10-CM | POA: Diagnosis not present

## 2022-11-30 ENCOUNTER — Other Ambulatory Visit: Payer: Self-pay | Admitting: Nurse Practitioner

## 2022-11-30 ENCOUNTER — Other Ambulatory Visit: Payer: Self-pay

## 2022-11-30 ENCOUNTER — Ambulatory Visit (INDEPENDENT_AMBULATORY_CARE_PROVIDER_SITE_OTHER): Payer: 59 | Admitting: Psychiatry

## 2022-11-30 VITALS — BP 134/85 | HR 70

## 2022-11-30 DIAGNOSIS — L299 Pruritus, unspecified: Secondary | ICD-10-CM

## 2022-11-30 DIAGNOSIS — F411 Generalized anxiety disorder: Secondary | ICD-10-CM | POA: Diagnosis not present

## 2022-11-30 DIAGNOSIS — J4520 Mild intermittent asthma, uncomplicated: Secondary | ICD-10-CM

## 2022-11-30 DIAGNOSIS — F321 Major depressive disorder, single episode, moderate: Secondary | ICD-10-CM

## 2022-11-30 MED ORDER — CYCLOBENZAPRINE HCL 5 MG PO TABS
5.0000 mg | ORAL_TABLET | Freq: Three times a day (TID) | ORAL | 0 refills | Status: DC | PRN
  Filled 2022-11-30: qty 30, 10d supply, fill #0

## 2022-11-30 MED ORDER — CETIRIZINE HCL 10 MG PO TABS
10.0000 mg | ORAL_TABLET | Freq: Every day | ORAL | 6 refills | Status: DC
  Filled 2022-11-30 – 2023-01-18 (×3): qty 30, 30d supply, fill #0
  Filled 2023-11-25: qty 30, 30d supply, fill #1

## 2022-11-30 MED ORDER — ESCITALOPRAM OXALATE 10 MG PO TABS: 10.0000 mg | ORAL_TABLET | Freq: Every day | ORAL | 2 refills | Status: DC

## 2022-11-30 NOTE — Telephone Encounter (Signed)
Please advise KH 

## 2022-11-30 NOTE — Progress Notes (Signed)
BH MD/PA/NP OP Progress Note  11/30/2022 5:12 PM Caroline Ryan [facility]  MRN:  [id number]  Chief Complaint:  Chief Complaint  Patient presents with   Follow-up   HPI: Pt reports that she is feeling anxious today because of her knee pain but usually her mood has been stable.  She reports improvement in symptoms of depression and anxiety after starting Lexapro and therapy.  She has been sleeping well.  She reports stable appetite.  Currently, she denies any suicidal ideations, homicidal ideations, auditory and visual hallucinations.  She denies paranoia.  She denies any medication side effects and has been tolerating it well. She reports that she was in the hospital for pneumonia for 3 weeks in March.  She reports that she is currently waiting for disability du to her knee pain.  She is hoping for knee surgery this year.  She has been doing physical therapy and aqua therapy. She denies is any issues with her son.  She has been doing therapy with Starleen Arms at [facility]. Discussed increasing Lexapro to help with anxiety but patient wants to keep Lexapro at the same dose.  Patient also takes hydroxyzine for itching.  Discussed that she can also utilize hydroxyzine as needed for anxiety.  She verbalizes understanding.  She denies any other concerns. Patient is alert and oriented x 4, anxious, cooperative, and fully engaged in conversation during the encounter.  Her thought process is linear with coherent speech . She does not appear to be responding to internal/external stimuli .    Visit Diagnosis:    ICD-10-CM   1. GAD (generalized anxiety disorder)  F41.1 escitalopram (LEXAPRO) 10 MG tablet    2. MDD (major depressive disorder), single episode, moderate  F32.1 escitalopram (LEXAPRO) 10 MG tablet    3. Pruritus  L29.9       Past Psychiatric History: Previous Psych Diagnoses: Recently diagnosed with MDD by therapist at [facility] Prior inpatient treatment: Denies Current meds: Was prescribed hydroxyzine  25 mg 3 times daily as needed for anxiety by PCP but has not started yet Psychotherapy hx: Getting therapy with Starleen [facility] at [facility] Previous suicidal attempts: Denies Previous medication trials: None Current therapist: Starleen Arms at [facility]  Past Medical History:  Past Medical History:  Diagnosis Date   Anemia    Ascending aorta dilatation    38mm by echo 07/2021 and 40mm by echo 07/2022   Asthma    Depression    no meds   Eczema    Fibroid    Headache(784.0)    otc prn med   History of blood transfusion    Hypertension    Knee pain, bilateral    arthralgia knee pain bilateral   Lower extremity edema    Mitral regurgitation    Mild to moderate by echo 07/2021   Seasonal allergies    SVD (spontaneous vaginal delivery)    x 3   Vaginal polyp 05/2020    Past Surgical History:  Procedure Laterality Date   ABDOMINAL HYSTERECTOMY     BILATERAL SALPINGECTOMY Bilateral 10/10/2017   Procedure: BILATERAL SALPINGECTOMY;  Surgeon: Hermina Staggers, MD;  Location: [facility];  Service: Gynecology;  Laterality: Bilateral;   BREAST BIOPSY     US guided core 2009   BREAST EXCISIONAL BIOPSY     right 1982 left 1981   BREAST SURGERY     bilateral cysts/benign   COLONOSCOPY  10/17/2021   VAGINAL HYSTERECTOMY N/A 10/10/2017   Procedure: HYSTERECTOMY VAGINAL;  Surgeon: Hermina Staggers, MD;  Location: [facility];  Service: Gynecology;  Laterality: N/A;    Family Psychiatric History: Psych: Son- Schizophrenia, he comes to [facility] too SA/HA: Denies  Family History:  Family History  Problem Relation Age of Onset   Breast cancer Mother 16   Hypertension Father    Diabetes Brother    Diabetes Brother    Other Neg Hx    Colon polyps Neg Hx    Colon cancer Neg Hx    Esophageal cancer Neg Hx    Stomach cancer Neg Hx    Rectal cancer Neg Hx     Social History:  Social History   Socioeconomic History   Marital status: Single    Spouse name: Not on file   Number of children: Not on  file   Years of education: Not on file   Highest education level: Not on file  Occupational History   Not on file  Tobacco Use   Smoking status: Former    Packs/day: 0.25    Years: 2.00    Additional pack years: 0.00    Total pack years: 0.50    Types: Cigarettes    Quit date: 08/20/2014    Years since quitting: 8.2   Smokeless tobacco: Never  Vaping Use   Vaping Use: Never used  Substance and Sexual Activity   Alcohol use: Yes    Alcohol/week: 2.0 standard drinks of alcohol    Types: 2 Glasses of wine per week   Drug use: No   Sexual activity: Yes    Birth control/protection: Condom  Other Topics Concern   Not on file  Social History Narrative   Not on file   Social Determinants of Health   Financial Resource Strain: High Risk (08/21/2022)   Overall Financial Resource Strain (CARDIA)    Difficulty of Paying Living Expenses: Very hard  Food Insecurity: Food Insecurity Present (08/21/2022)   Hunger Vital Sign    Worried About Running Out of Food in the Last Year: Often true    Ran Out of Food in the Last Year: Often true  Transportation Needs: No Transportation Needs (08/21/2022)   PRAPARE - Administrator, Civil Service (Medical): No    Lack of Transportation (Non-Medical): No  Physical Activity: Inactive (08/21/2022)   Exercise Vital Sign    Days of Exercise per Week: 0 days    Minutes of Exercise per Session: 0 min  Stress: Stress Concern Present (07/27/2022)   [employer] of Occupational Health - Occupational Stress Questionnaire    Feeling of Stress : Very much  Social Connections: Socially Isolated (08/21/2022)   Social Connection and Isolation Panel [NHANES]    Frequency of Communication with Friends and Family: More than three times a week    Frequency of Social Gatherings with Friends and Family: Never    Attends Religious Services: Never    Database administrator or Organizations: No    Attends Banker Meetings: Never    Marital  Status: Never married    Allergies: No Known Allergies  Metabolic Disorder Labs: Lab Results  Component Value Date   HGBA1C 5.2 04/07/2020   MPG 82 07/04/2016   MPG 100 06/27/2015   No results found for: "PROLACTIN" Lab Results  Component Value Date   CHOL 201 (H) 09/08/2021   TRIG 81 09/08/2021   HDL 67 09/08/2021   CHOLHDL 3.0 09/08/2021   VLDL 9 06/27/2015   LDLCALC 119 (H) 09/08/2021  LDLCALC 97 12/24/2018   Lab Results  Component Value Date   TSH 1.530 12/24/2018   TSH 1.450 11/15/2017    Therapeutic Level Labs: No results found for: "LITHIUM" No results found for: "VALPROATE" No results found for: "CBMZ"  Current Medications: Current Outpatient Medications  Medication Sig Dispense Refill   acetaminophen (TYLENOL) 325 MG tablet Take 650 mg by mouth daily as needed for moderate pain, headache or fever.     albuterol (VENTOLIN HFA) 108 (90 Base) MCG/ACT inhaler Inhale 1 puff every 6 hours as needed for shortness of breath 18 g 5   aspirin 81 MG chewable tablet Chew by mouth daily.     azithromycin (ZITHROMAX) 250 MG tablet Take 1 tablet (250 mg total) by mouth daily. Take first 2 tablets together, then 1 every day until finished. (Patient not taking: Reported on 11/12/2022) 6 tablet 0   benzonatate (TESSALON) 100 MG capsule Take 1 capsule (100 mg total) by mouth every 8 (eight) hours. (Patient not taking: Reported on 11/12/2022) 21 capsule 0   carvedilol (COREG) 25 MG tablet Take 1 tablet (25 mg total) by mouth 2 (two) times daily. 180 tablet 3   cetirizine (ZYRTEC) 10 MG tablet Take 1 tablet (10 mg total) by mouth daily. 30 tablet 6   ciclopirox (LOPROX) 0.77 % cream Apply topically 2 (two) times daily. 15 g 0   Ciclopirox-Salicylic Acid 0.77-2 % SHAM Apply 1 Application topically 2 (two) times daily. (Patient not taking: Reported on 10/23/2022) 30 g 3   cyclobenzaprine (FLEXERIL) 5 MG tablet Take 1 tablet (5 mg total) by mouth 3 (three) times daily as needed for muscle  spasms. 30 tablet 0   diclofenac Sodium (VOLTAREN) 1 % GEL Apply 4 g topically 4 (four) times daily. (Patient taking differently: Apply 4 g topically 4 (four) times daily as needed (pain).) 100 g 3   docusate sodium (COLACE) 100 MG capsule Take 1 capsule (100 mg total) by mouth 2 (two) times daily as needed. (Patient taking differently: Take 100 mg by mouth 2 (two) times daily as needed (constipation).) 30 capsule 2   escitalopram (LEXAPRO) 10 MG tablet Take 1 tablet (10 mg total) by mouth daily. 30 tablet 2   fluticasone (FLONASE) 50 MCG/ACT nasal spray Place 1 spray into both nostrils daily. (Patient taking differently: Place 1 spray into both nostrils daily as needed for allergies.) 16 g 2   furosemide (LASIX) 20 MG tablet Take 1 tablet (20 mg total) by mouth daily. 30 tablet 6   hydrocortisone 2.5 % cream Apply topically 2 (two) times daily. (Patient taking differently: Apply 1 Application topically daily as needed (itching).) 30 g 1   hydrOXYzine (ATARAX) 25 MG tablet Take 1 tablet (25 mg total) by mouth 3 (three) times daily as needed. (Patient taking differently: Take 25 mg by mouth 3 (three) times daily as needed for itching.) 30 tablet 11   ibuprofen (ADVIL) 800 MG tablet Take 1 tablet (800 mg total) by mouth 3 (three) times daily. (Patient not taking: Reported on 10/23/2022) 30 tablet 6   ipratropium-albuterol (DUONEB) 0.5-2.5 (3) MG/3ML SOLN Take 3 mLs by nebulization every 6 (six) hours as needed. 360 mL 0   linaclotide (LINZESS) 72 MCG capsule Take 1 capsule (72 mcg total) by mouth daily before breakfast. 30 capsule 0   methocarbamol (ROBAXIN-750) 750 MG tablet Take 1 tablet (750 mg total) by mouth 4 (four) times daily. (Patient taking differently: Take 750 mg by mouth every 6 (six) hours as needed  for muscle spasms.) 120 tablet 3   nitroGLYCERIN (NITROSTAT) 0.4 MG SL tablet Place 1 tablet (0.4 mg total) under the tongue every 5 (five) minutes as needed for chest pain (max 3 doses, if no  relief call 911). 25 tablet 5   NONFORMULARY OR COMPOUNDED ITEM Diclofenac 3%, Gabapentin 5%, Lidocaine 5%, Menthol 1%.  Apply 1-2 pumps three to four times per day as needed (Patient not taking: Reported on 10/23/2022) 1 each 0   olmesartan (BENICAR) 40 MG tablet Take 1 tablet (40 mg total) by mouth daily. 90 tablet 3   Respiratory Therapy Supplies (NEBULIZER MASK ADULT) MISC Use this mask with your nebulizer. 1 each 0   spironolactone (ALDACTONE) 100 MG tablet Take 1 tablet (100 mg total) by mouth daily. 90 tablet 3   triamcinolone cream (KENALOG) 0.5 % Apply 1 Application topically 3 (three) times daily. 30 g 0   Current Facility-Administered Medications  Medication Dose Route Frequency Provider Last Rate Last Admin   Triamcinolone Acetonide (ZILRETTA) intra-articular injection 32 mg  32 mg Intra-articular Once Raulkar, Drema Pry, MD       Triamcinolone Acetonide (ZILRETTA) intra-articular injection 32 mg  32 mg Intra-articular Once Raulkar, Drema Pry, MD         Musculoskeletal: Strength & Muscle Tone: within normal limits Gait & Station: unsteady due to knee pain, uses cane Patient leans: N/A  Psychiatric Specialty Exam: Review of Systems  Blood pressure 134/85, pulse 70, last menstrual period 04/03/2017, SpO2 100 %.There is no height or weight on file to calculate BMI.  General Appearance: Fairly Groomed  Eye Contact:  Good  Speech:  Clear and Coherent  Volume:  Normal  Mood:  Anxious  Affect:  Appropriate and Congruent  Thought Process:  Coherent  Orientation:  Full (Time, Place, and Person)  Thought Content: Logical   Suicidal Thoughts:  No  Homicidal Thoughts:  No  Memory: Grossly intact  Judgement:  Good  Insight:  Good  Psychomotor Activity:  Normal  Concentration:  Concentration: Good and Attention Span: Good  Recall:  Good  Fund of Knowledge: Good  Language: Good  Akathisia:  No  Handed:    AIMS (if indicated): not done  Assets:  Communication Skills Desire for  Improvement Financial Resources/Insurance Housing Social Support  ADL's:  Intact  Cognition: WNL  Sleep:  Good   Screenings: GAD-7    Loss adjuster, chartered Office Visit from 09/21/2022 in [facility] Counselor from 08/21/2022 in [facility] Office Visit from 08/09/2020 in Center for [facility] Office Visit from 07/19/2017 in Center for [facility]  Total GAD-7 Score PHQ2-9    Flowsheet Row Office Visit from 09/21/2022 in [facility] Counselor from 08/21/2022 in [facility] Patient Outreach Telephone from 07/27/2022 in [place] [facility] Office Visit from 07/09/2022 in [facility] Patient Outreach Telephone from 05/21/2022 in [facility] Community Care Coordination  PHQ-2 Total Score 0 2  PHQ-9 Total Score -- 4      Flowsheet Row ED from 10/23/2022 in [facility] Emergency Department at [facility] Counselor from 08/21/2022 in [facility]  C-SSRS RISK CATEGORY No Risk Low Risk        Assessment and Plan: Patient is 57 y.o. female with recently diagnosed with  MDD, and medical history of hypertension, mitral regurgitation, asthma, OSA, and bilateral chronic knee pain presented to [facility] outpatient clinic for medication management.  Patient reports improvement in her depression and anxiety but still feels anxious mainly because of knee pain.  Discussed increasing Lexapro to help with anxiety but patient wants to keep it at the same dose.  Denies any other concerns or side effects.   MDD Generalized anxiety disorder -Continue Lexapro 10 mg daily for anxiety and depression. R/b/se/a discussed and patient agrees with medication trial.  30 prescription with 2 refill sent to patient's  pharmacy -Continue psychotherapy with Starleen Arms  Follow-up in 8 weeks  Collaboration of Care: none   Patient/Guardian was advised Release of Information must be obtained prior to any record release in order to collaborate their care with an outside provider. Patient/Guardian was advised if they have not already done so to contact the registration department to sign all necessary forms in order for [place] to release information regarding their care.   Consent: Patient/Guardian gives verbal consent for treatment and assignment of benefits for services provided during this visit. Patient/Guardian expressed understanding and agreed to proceed.    Karsten Ro, MD 11/30/2022, 5:12 PM

## 2022-12-03 ENCOUNTER — Other Ambulatory Visit: Payer: Self-pay

## 2022-12-03 NOTE — Therapy (Signed)
OUTPATIENT PHYSICAL THERAPY EVALUATION   Patient Name: Caroline Ryan MRN: [id number] DOB:02/16/1966, 57 y.o., female Today's Date: 12/04/2022   PCP: Warrick Parisian REFERRING PROVIDER: [name], MD   END OF SESSION:  PT End of Session - 12/04/22 1243     Visit Number 3    Number of Visits 17    Date for PT Re-Evaluation 01/05/23    Authorization Type [employer] / [employer] MCD    Authorization - Number of Visits 27    PT Start Time 1245    PT Stop Time 1330    PT Time Calculation (min) 45 min    Activity Tolerance Patient tolerated treatment well    Behavior During Therapy WFL for tasks assessed/performed               Past Medical History:  Diagnosis Date   Anemia    Ascending aorta dilatation    69mm by echo 07/2021 and 73mm by echo 07/2022   Asthma    Depression    no meds   Eczema    Fibroid    Headache(784.0)    otc prn med   History of blood transfusion    Hypertension    Knee pain, bilateral    arthralgia knee pain bilateral   Lower extremity edema    Mitral regurgitation    Mild to moderate by echo 07/2021   Seasonal allergies    SVD (spontaneous vaginal delivery)    x 3   Vaginal polyp 05/2020   Past Surgical History:  Procedure Laterality Date   ABDOMINAL HYSTERECTOMY     BILATERAL SALPINGECTOMY Bilateral 10/10/2017   Procedure: BILATERAL SALPINGECTOMY;  Surgeon: Hermina Staggers, MD;  Location: [facility];  Service: Gynecology;  Laterality: Bilateral;   BREAST BIOPSY     US guided core 2009   BREAST EXCISIONAL BIOPSY     right 1982 left 1981   BREAST SURGERY     bilateral cysts/benign   COLONOSCOPY  10/17/2021   VAGINAL HYSTERECTOMY N/A 10/10/2017   Procedure: HYSTERECTOMY VAGINAL;  Surgeon: Hermina Staggers, MD;  Location: [facility];  Service: Gynecology;  Laterality: N/A;   Patient Active Problem List   Diagnosis Date Noted   Severe major depression, single episode 08/21/2022   Chronic pain of both knees 05/16/2022   OSA  (obstructive sleep apnea) 11/09/2021   Mitral regurgitation 09/07/2021   Dilated aortic root 09/07/2021   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/2020   Unilateral primary osteoarthritis, left knee 10/22/2019   Unilateral primary osteoarthritis, right knee 10/22/2019   BMI 50.0-59.9, adult 10/22/2019   Grieving 06/10/2019   Pruritus 06/10/2019   Lower extremity edema 11/02/2018   Post-operative state 10/10/2017   Symptomatic anemia 05/20/2017   Arthralgia of both knees 06/27/2015   BACK STRAIN, LUMBAR 03/16/2010   KNEE PAIN, BILATERAL 06/30/2009   OBESITY 04/13/2009   Edema 04/13/2009   FIBROADENOMA, BREAST 04/20/2008   HYPERTENSION, BENIGN ESSENTIAL 04/14/2008   Allergic rhinitis 04/14/2008   Asthma 04/14/2008    REFERRING DIAG: Bilateral primary osteoarthritis of knee  THERAPY DIAG:  Chronic pain of both knees  Muscle weakness (generalized)  Other abnormalities of gait and mobility  Rationale for Evaluation and Treatment: Rehabilitation  PRECAUTIONS: None  PERTINENT HISTORY: See PMH above    SUBJECTIVE:  SUBJECTIVE STATEMENT: Patient reports the pool was great but her knees still hurt with walking and exercises. She was on her feet a lot cooking this past weekend and that really aggravated her knees.  PAIN:  Are you having pain? Yes:  NPRS scale: 8/10 Pain location: Bilateral knee Pain description: Achy, burning, sore Aggravating factors: Bending down or getting on knees, walking, standing, stairs Relieving factors: Medication   OBJECTIVE:  PATIENT SURVEYS:  KOOS Jr.: 16/28  POSTURE:   Varus knee alignment  PALPATION: Tenderness to parapatellar region bilaterally  LOWER EXTREMITY ROM:  Active ROM Right eval Left eval  Hip flexion    Hip extension    Hip abduction    Hip adduction    Hip internal rotation    Hip external rotation    Knee flexion 102 110  Knee extension 0 0  Ankle dorsiflexion    Ankle plantarflexion    Ankle inversion    Ankle  eversion     (Blank rows = not tested)  LOWER EXTREMITY MMT:  MMT Right eval Left eval  Hip flexion    Hip extension    Hip abduction    Hip adduction    Hip internal rotation    Hip external rotation    Knee flexion 5 5  Knee extension 4 4  Ankle dorsiflexion    Ankle plantarflexion    Ankle inversion    Ankle eversion     (Blank rows = not tested)  FUNCTIONAL TESTS:  5 times sit to stand: 22 seconds  12/04/2022: 9 seconds 6 minute walk test: 700 ft with 1 seated rest break for 30 sec at 3:20-2:50  GAIT: Distance walked: 700 ft Assistive device utilized: None Level of assistance: Complete Independence Comments: Trendelenburg, varus alignment   TODAY'S TREATMENT:    [facility] Adult PT Treatment:                                                DATE: 12/04/2022 Therapeutic Exercise: NuStep L5 x 6 min with UE/LE while taking subjective Quad set with ball under knee 2 x 10 each SLR 2 x 10 each Bridge 2 x 10 Supine clamshell with blue 2 x 10 Sidelying hip abduction 2 x 10 each LAQ with 2# 2 x 15 each Seated hamstring curl with blue 2 x 10 each Sit to stand 3 x 10 Standing heel raises 2 x 10   [facility] Adult PT Treatment:                                                DATE: 11/10/2022 Therapeutic Exercise: SLR x 10 each Bridge x 10 Sidelying hip abduction x 10 each LAQ x 10 each Sit to stand x 10  PATIENT EDUCATION:  Education details: HEP Person educated: Patient Education method: Programmer, multimedia, Demonstration, Actor cues, Verbal cues Education comprehension: Verbalized understanding, returned demonstration, verbal cues required, tactile cues required, and needs further education  HOME EXERCISE PROGRAM: Access Code: [email or web address]    ASSESSMENT: CLINICAL IMPRESSION: Patient tolerated therapy well with no adverse effects. She does report increase in knee pain this visit from being on her feet a lot the past few days. Therapy focused on continued LE strengthening with good  tolerance and patient able to increase resistance with knee exercises. She demonstrates much improved 5xSTS this visit indicating improved LE strength and reduced fall risk. No changes made to HEP this visit. Patient would benefit from  continued skilled PT to progress her mobility and strength to reduce pain and maximize functional ability.   OBJECTIVE IMPAIRMENTS: Abnormal gait, decreased activity tolerance, decreased balance, difficulty walking, decreased ROM, decreased strength, improper body mechanics, and pain.   ACTIVITY LIMITATIONS: bending, standing, squatting, stairs, and locomotion level  PARTICIPATION LIMITATIONS: meal prep, cleaning, shopping, community activity, and occupation  PERSONAL FACTORS: Fitness, Past/current experiences, and Time since onset of injury/illness/exacerbation are also affecting patient's functional outcome.    GOALS: Goals reviewed with patient? Yes  SHORT TERM GOALS: Target date: 12/08/2022  Patient will be I with initial HEP in order to progress with therapy. Baseline: HEP provided at eval 12/04/2022: independent with initial HEP Goal status: MET  2.  Patient will report knee pain </= 5/10 in order to reduce functional limitations Baseline:  12/04/2022: 8/10  Goal status: ONGOING  3.  Patient will perform 5xSTS </= 15 seconds to indicate improvement in LE strength and reduction of fall risk Baseline: 22 seconds 12/04/2022: 9 seconds Goal status: MET  LONG TERM GOALS: Target date: 01/05/2023  Patient will be I with final HEP to maintain progress from PT. Baseline: HEP provided at eval Goal status: INITIAL  2.  Patient will report KOOS Jr </= 2/28 in order to indicate reduced pain and improvement in knee function Baseline: 16/28 Goal status: INITIAL  3.  Patient will demonstrate bilateral knee extension strength 5/5 MMT in order to improve walking tolerance reduce feeling of knees giving out Baseline: 4/5 MMT Goal status: INITIAL  4.   Patient will ambulate >/= 1000 ft with in order to improve community access and ability to go shopping without limitation Baseline: 700 ft Goal status: INITIAL   PLAN: PT FREQUENCY: 1-2x/week  PT DURATION: 8 weeks  PLANNED INTERVENTIONS: Therapeutic exercises, Therapeutic activity, Neuromuscular re-education, Balance training, Gait training, Patient/Family education, Self Care, Joint mobilization, Aquatic Therapy, Dry Needling, Electrical stimulation, Cryotherapy, Moist heat, Ionotophoresis /ml Dexamethasone, Manual therapy, and Re-evaluation  PLAN FOR NEXT SESSION: Review HEP and progress PRN, progress quad and hip strengthening, progress to closed chain exercises, balance training   Rosana Hoes, PT, DPT, LAT, ATC 12/04/22  1:36 PM Phone: [phone] Fax: [phone]

## 2022-12-04 ENCOUNTER — Encounter: Payer: Self-pay | Admitting: Physical Therapy

## 2022-12-04 ENCOUNTER — Other Ambulatory Visit: Payer: Self-pay

## 2022-12-04 ENCOUNTER — Ambulatory Visit: Payer: 59 | Admitting: Physical Therapy

## 2022-12-04 DIAGNOSIS — M25562 Pain in left knee: Secondary | ICD-10-CM | POA: Diagnosis not present

## 2022-12-04 DIAGNOSIS — R2689 Other abnormalities of gait and mobility: Secondary | ICD-10-CM | POA: Diagnosis not present

## 2022-12-04 DIAGNOSIS — M6281 Muscle weakness (generalized): Secondary | ICD-10-CM | POA: Diagnosis not present

## 2022-12-04 DIAGNOSIS — G8929 Other chronic pain: Secondary | ICD-10-CM | POA: Diagnosis not present

## 2022-12-04 DIAGNOSIS — M25561 Pain in right knee: Secondary | ICD-10-CM | POA: Diagnosis not present

## 2022-12-05 ENCOUNTER — Other Ambulatory Visit: Payer: Self-pay

## 2022-12-06 ENCOUNTER — Ambulatory Visit: Payer: 59 | Admitting: Physical Therapy

## 2022-12-06 ENCOUNTER — Encounter: Payer: Self-pay | Admitting: Physical Therapy

## 2022-12-06 DIAGNOSIS — R2689 Other abnormalities of gait and mobility: Secondary | ICD-10-CM

## 2022-12-06 DIAGNOSIS — M25562 Pain in left knee: Secondary | ICD-10-CM | POA: Diagnosis not present

## 2022-12-06 DIAGNOSIS — M6281 Muscle weakness (generalized): Secondary | ICD-10-CM

## 2022-12-06 DIAGNOSIS — M25561 Pain in right knee: Secondary | ICD-10-CM | POA: Diagnosis not present

## 2022-12-06 DIAGNOSIS — G8929 Other chronic pain: Secondary | ICD-10-CM | POA: Diagnosis not present

## 2022-12-06 NOTE — Therapy (Signed)
OUTPATIENT PHYSICAL THERAPY EVALUATION   Patient Name: Caroline Ryan MRN: [id number] DOB:1966-03-30, 57 y.o., female Today's Date: 12/06/2022   PCP: Warrick Parisian REFERRING PROVIDER: [name], MD   END OF SESSION:  PT End of Session - 12/06/22 1521     Visit Number 4    Number of Visits 17    Date for PT Re-Evaluation 01/05/23    Authorization Type [employer] / [facility] MCD    Authorization - Number of Visits 27    PT Start Time 1522    PT Stop Time 1603    PT Time Calculation (min) 41 min    Activity Tolerance Patient tolerated treatment well    Behavior During Therapy WFL for tasks assessed/performed                Past Medical History:  Diagnosis Date   Anemia    Ascending aorta dilatation    38mm by echo 07/2021 and 40mm by echo 07/2022   Asthma    Depression    no meds   Eczema    Fibroid    Headache(784.0)    otc prn med   History of blood transfusion    Hypertension    Knee pain, bilateral    arthralgia knee pain bilateral   Lower extremity edema    Mitral regurgitation    Mild to moderate by echo 07/2021   Seasonal allergies    SVD (spontaneous vaginal delivery)    x 3   Vaginal polyp 05/2020   Past Surgical History:  Procedure Laterality Date   ABDOMINAL HYSTERECTOMY     BILATERAL SALPINGECTOMY Bilateral 10/10/2017   Procedure: BILATERAL SALPINGECTOMY;  Surgeon: Hermina Staggers, MD;  Location: [facility];  Service: Gynecology;  Laterality: Bilateral;   BREAST BIOPSY     US guided core 2009   BREAST EXCISIONAL BIOPSY     right 1982 left 1981   BREAST SURGERY     bilateral cysts/benign   COLONOSCOPY  10/17/2021   VAGINAL HYSTERECTOMY N/A 10/10/2017   Procedure: HYSTERECTOMY VAGINAL;  Surgeon: Hermina Staggers, MD;  Location: [facility];  Service: Gynecology;  Laterality: N/A;   Patient Active Problem List   Diagnosis Date Noted   Severe major depression, single episode 08/21/2022   Chronic pain of both knees 05/16/2022   OSA  (obstructive sleep apnea) 11/09/2021   Mitral regurgitation 09/07/2021   Dilated aortic root 09/07/2021   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/2020   Unilateral primary osteoarthritis, left knee 10/22/2019   Unilateral primary osteoarthritis, right knee 10/22/2019   BMI 50.0-59.9, adult 10/22/2019   Grieving 06/10/2019   Pruritus 06/10/2019   Lower extremity edema 11/02/2018   Post-operative state 10/10/2017   Symptomatic anemia 05/20/2017   Arthralgia of both knees 06/27/2015   BACK STRAIN, LUMBAR 03/16/2010   KNEE PAIN, BILATERAL 06/30/2009   OBESITY 04/13/2009   Edema 04/13/2009   FIBROADENOMA, BREAST 04/20/2008   HYPERTENSION, BENIGN ESSENTIAL 04/14/2008   Allergic rhinitis 04/14/2008   Asthma 04/14/2008    REFERRING DIAG: Bilateral primary osteoarthritis of knee  THERAPY DIAG:  Chronic pain of both knees  Muscle weakness (generalized)  Other abnormalities of gait and mobility  Chronic pain of left knee  Rationale for Evaluation and Treatment: Rehabilitation  PRECAUTIONS: None  PERTINENT HISTORY: See PMH above    SUBJECTIVE:  SUBJECTIVE STATEMENT: Pt reports that her knees are sore today, but she is looking forward to exercises in the pool.  PAIN:  Are you having pain?  Yes:  NPRS scale: 7/10 Pain location: Bilateral knee Pain description: Achy, burning, sore Aggravating factors: Bending down or getting on knees, walking, standing, stairs Relieving factors: Medication   OBJECTIVE:  PATIENT SURVEYS:  KOOS Jr.: 16/28  POSTURE:   Varus knee alignment  PALPATION: Tenderness to parapatellar region bilaterally  LOWER EXTREMITY ROM:  Active ROM Right eval Left eval  Hip flexion    Hip extension    Hip abduction    Hip adduction    Hip internal rotation    Hip external rotation    Knee flexion 102 110  Knee extension 0 0  Ankle dorsiflexion    Ankle plantarflexion    Ankle inversion    Ankle eversion     (Blank rows = not tested)  LOWER EXTREMITY  MMT:  MMT Right eval Left eval  Hip flexion    Hip extension    Hip abduction    Hip adduction    Hip internal rotation    Hip external rotation    Knee flexion 5 5  Knee extension 4 4  Ankle dorsiflexion    Ankle plantarflexion    Ankle inversion    Ankle eversion     (Blank rows = not tested)  FUNCTIONAL TESTS:  5 times sit to stand: 22 seconds  12/04/2022: 9 seconds 6 minute walk test: 700 ft with 1 seated rest break for 30 sec at 3:20-2:50  GAIT: Distance walked: 700 ft Assistive device utilized: None Level of assistance: Complete Independence Comments: Trendelenburg, varus alignment   TODAY'S TREATMENT:     TREATMENT 12/06/22:  Aquatic therapy at [facility]- [address] - therapeutic pool temp 92 degrees Pt enters building independently.  Treatment took place in water 3.8 to  4 ft 8 in.feet deep depending upon activity.  Pt entered and exited the pool via stair and handrails    Aquatic Therapy:  Water walking for warm up fwd/lat/bkwds  Standing: Standing march Hamstring curl x20 BIL Hip abd/add x20 BIL Hip Circles CC/CCW 2x10 each BIL SL heel raises - x20 Hip ext/flex with knee straight x 20 BIL Yellow noodle stomp x20 ea Water jogging with yellow dumbells - 3 laps [place]vine - 3 laps HS stretch with yellow noodle Squats x20 Lunge walk fwd 2 laps  Pt requires the buoyancy of water for active assisted exercises with buoyancy supported for strengthening and AROM exercises. Hydrostatic pressure also supports joints by unweighting joint load by at least 50 % in 3-4 feet depth water. 80% in chest to neck deep water. Water will provide assistance with movement using the current and laminar flow while the buoyancy reduces weight bearing. Pt requires the viscosity of the water for resistance with strengthening exercises.   [facility] Adult PT Treatment:                                                DATE: 12/04/2022 Therapeutic Exercise: NuStep L5 x 6 min with  UE/LE while taking subjective Quad set with ball under knee 2 x 10 each SLR 2 x 10 each Bridge 2 x 10 Supine clamshell with blue 2 x 10 Sidelying hip abduction 2 x 10 each LAQ with 2# 2 x 15 each Seated hamstring curl with blue 2 x 10 each Sit to stand 3 x 10 Standing heel raises 2 x 10   [facility] Adult PT Treatment:  DATE: 11/10/2022 Therapeutic Exercise: SLR x 10 each Bridge x 10 Sidelying hip abduction x 10 each LAQ x 10 each Sit to stand x 10  PATIENT EDUCATION:  Education details: HEP Person educated: Patient Education method: Programmer, multimedia, Demonstration, Tactile cues, Verbal cues Education comprehension: Verbalized understanding, returned demonstration, verbal cues required, tactile cues required, and needs further education  HOME EXERCISE PROGRAM: Access Code: [email or web address]    ASSESSMENT: CLINICAL IMPRESSION: Session today focused on knee and hip strengthening in the aquatic environment for use of buoyancy to offload joints and the viscosity of water as resistance during therapeutic exercise.  Pt shows improved knee ROM and activity tolerance as session advanced.  She reports fatigue with decrease in pain by end of session.  Patient was able to tolerate all prescribed exercises in the aquatic environment with no adverse effects and reports 4/10 pain at the end of the session. Patient continues to benefit from skilled PT services on land and aquatic based and should be progressed as able to improve functional independence.    OBJECTIVE IMPAIRMENTS: Abnormal gait, decreased activity tolerance, decreased balance, difficulty walking, decreased ROM, decreased strength, improper body mechanics, and pain.   ACTIVITY LIMITATIONS: bending, standing, squatting, stairs, and locomotion level  PARTICIPATION LIMITATIONS: meal prep, cleaning, shopping, community activity, and occupation  PERSONAL FACTORS: Fitness, Past/current experiences, and Time  since onset of injury/illness/exacerbation are also affecting patient's functional outcome.    GOALS: Goals reviewed with patient? Yes  SHORT TERM GOALS: Target date: 12/08/2022  Patient will be I with initial HEP in order to progress with therapy. Baseline: HEP provided at eval 12/04/2022: independent with initial HEP Goal status: MET  2.  Patient will report knee pain </= 5/10 in order to reduce functional limitations Baseline:  12/04/2022: 8/10  Goal status: ONGOING  3.  Patient will perform 5xSTS </= 15 seconds to indicate improvement in LE strength and reduction of fall risk Baseline: 22 seconds 12/04/2022: 9 seconds Goal status: MET  LONG TERM GOALS: Target date: 01/05/2023  Patient will be I with final HEP to maintain progress from PT. Baseline: HEP provided at eval Goal status: INITIAL  2.  Patient will report KOOS Jr </= 2/28 in order to indicate reduced pain and improvement in knee function Baseline: 16/28 Goal status: INITIAL  3.  Patient will demonstrate bilateral knee extension strength 5/5 MMT in order to improve walking tolerance reduce feeling of knees giving out Baseline: 4/5 MMT Goal status: INITIAL  4.  Patient will ambulate >/= 1000 ft with in order to improve community access and ability to go shopping without limitation Baseline: 700 ft Goal status: INITIAL   PLAN: PT FREQUENCY: 1-2x/week  PT DURATION: 8 weeks  PLANNED INTERVENTIONS: Therapeutic exercises, Therapeutic activity, Neuromuscular re-education, Balance training, Gait training, Patient/Family education, Self Care, Joint mobilization, Aquatic Therapy, Dry Needling, Electrical stimulation, Cryotherapy, Moist heat, Ionotophoresis 4mg /ml Dexamethasone, Manual therapy, and Re-evaluation  PLAN FOR NEXT SESSION: Review HEP and progress PRN, progress quad and hip strengthening, progress to closed chain exercises, balance training   Fredderick Phenix PT 12/06/22  4:04 PM Phone:  [phone] Fax: [phone]

## 2022-12-11 ENCOUNTER — Ambulatory Visit: Payer: 59 | Admitting: Physical Therapy

## 2022-12-11 ENCOUNTER — Encounter: Payer: Self-pay | Admitting: Physical Therapy

## 2022-12-11 DIAGNOSIS — M6281 Muscle weakness (generalized): Secondary | ICD-10-CM | POA: Diagnosis not present

## 2022-12-11 DIAGNOSIS — G8929 Other chronic pain: Secondary | ICD-10-CM

## 2022-12-11 DIAGNOSIS — M25562 Pain in left knee: Secondary | ICD-10-CM | POA: Diagnosis not present

## 2022-12-11 DIAGNOSIS — R2689 Other abnormalities of gait and mobility: Secondary | ICD-10-CM | POA: Diagnosis not present

## 2022-12-11 DIAGNOSIS — M25561 Pain in right knee: Secondary | ICD-10-CM | POA: Diagnosis not present

## 2022-12-11 NOTE — Therapy (Signed)
OUTPATIENT PHYSICAL THERAPY EVALUATION   Patient Name: Caroline Ryan MRN: [id number] DOB:08-Jun-1966, 57 y.o., female Today's Date: 12/11/2022   PCP: Warrick Parisian REFERRING PROVIDER: [name], MD   END OF SESSION:  PT End of Session - 12/11/22 1019     Visit Number 5    Number of Visits 17    Date for PT Re-Evaluation 01/05/23    Authorization Type [employer] / [facility] MCD    Authorization - Number of Visits 27    PT Start Time 1018    PT Stop Time 1058    PT Time Calculation (min) 40 min                Past Medical History:  Diagnosis Date   Anemia    Ascending aorta dilatation    38mm by echo 07/2021 and 40mm by echo 07/2022   Asthma    Depression    no meds   Eczema    Fibroid    Headache(784.0)    otc prn med   History of blood transfusion    Hypertension    Knee pain, bilateral    arthralgia knee pain bilateral   Lower extremity edema    Mitral regurgitation    Mild to moderate by echo 07/2021   Seasonal allergies    SVD (spontaneous vaginal delivery)    x 3   Vaginal polyp 05/2020   Past Surgical History:  Procedure Laterality Date   ABDOMINAL HYSTERECTOMY     BILATERAL SALPINGECTOMY Bilateral 10/10/2017   Procedure: BILATERAL SALPINGECTOMY;  Surgeon: Hermina Staggers, MD;  Location: [facility];  Service: Gynecology;  Laterality: Bilateral;   BREAST BIOPSY     US guided core 2009   BREAST EXCISIONAL BIOPSY     right 1982 left 1981   BREAST SURGERY     bilateral cysts/benign   COLONOSCOPY  10/17/2021   VAGINAL HYSTERECTOMY N/A 10/10/2017   Procedure: HYSTERECTOMY VAGINAL;  Surgeon: Hermina Staggers, MD;  Location: [facility];  Service: Gynecology;  Laterality: N/A;   Patient Active Problem List   Diagnosis Date Noted   Severe major depression, single episode 08/21/2022   Chronic pain of both knees 05/16/2022   OSA (obstructive sleep apnea) 11/09/2021   Mitral regurgitation 09/07/2021   Dilated aortic root 09/07/2021    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/2020   Unilateral primary osteoarthritis, left knee 10/22/2019   Unilateral primary osteoarthritis, right knee 10/22/2019   BMI 50.0-59.9, adult 10/22/2019   Grieving 06/10/2019   Pruritus 06/10/2019   Lower extremity edema 11/02/2018   Post-operative state 10/10/2017   Symptomatic anemia 05/20/2017   Arthralgia of both knees 06/27/2015   BACK STRAIN, LUMBAR 03/16/2010   KNEE PAIN, BILATERAL 06/30/2009   OBESITY 04/13/2009   Edema 04/13/2009   FIBROADENOMA, BREAST 04/20/2008   HYPERTENSION, BENIGN ESSENTIAL 04/14/2008   Allergic rhinitis 04/14/2008   Asthma 04/14/2008    REFERRING DIAG: Bilateral primary osteoarthritis of knee  THERAPY DIAG:  Chronic pain of both knees  Muscle weakness (generalized)  Rationale for Evaluation and Treatment: Rehabilitation  PRECAUTIONS: None  PERTINENT HISTORY: See PMH above    SUBJECTIVE:  SUBJECTIVE STATEMENT: Pt reports that her knees are sore today, she continues to exercise and move around daily, trying to stay active.   PAIN:  Are you having pain? Yes:  NPRS scale: 6/10 Pain location: Bilateral knee Pain description: Achy, burning, sore Aggravating factors: Bending down or getting on knees, walking, standing, stairs Relieving factors: Medication  OBJECTIVE:  PATIENT SURVEYS:  KOOS Jr.: 16/28  POSTURE:   Varus knee alignment  PALPATION: Tenderness to parapatellar region bilaterally  LOWER EXTREMITY ROM:  Active ROM Right eval Left eval  Hip flexion    Hip extension    Hip abduction    Hip adduction    Hip internal rotation    Hip external rotation    Knee flexion 102 110  Knee extension 0 0  Ankle dorsiflexion    Ankle plantarflexion    Ankle inversion    Ankle eversion     (Blank rows = not tested)  LOWER EXTREMITY MMT:  MMT Right eval Left eval  Hip flexion    Hip extension    Hip abduction    Hip adduction    Hip internal rotation    Hip external rotation    Knee  flexion 5 5  Knee extension 4 4  Ankle dorsiflexion    Ankle plantarflexion    Ankle inversion    Ankle eversion     (Blank rows = not tested)  FUNCTIONAL TESTS:  5 times sit to stand: 22 seconds  12/04/2022: 9 seconds 6 minute walk test: 700 ft with 1 seated rest break for 30 sec at 3:20-2:50: 12/11/22: 824 ft with 1 seated rest break 3:30 -3:45 sec no AD  12/11/22: SLS 3 sec Left, 5 sec Right GAIT: Distance walked: 700 ft Assistive device utilized: None Level of assistance: Complete Independence Comments: Trendelenburg, varus alignment   TODAY'S TREATMENT:     [facility] Adult PT Treatment:                                                DATE: 12/11/22 Therapeutic Exercise: Nustep L5 UE/LE x 5 minutes  LAQ 3# 15 x 2 each  Sit to stand 3 x 10 SLS 5 sec Right, 3 sec on Left  Tandem stance 30 sec RLE back  Alt March x 10 Alt H/s curls  x 10 Heel raises  x 12   Therapeutic Activity: 6 MWT 824 feet with 1 seated rest break    TREATMENT 12/06/22:  Aquatic therapy at [facility]- [address] - therapeutic pool temp 92 degrees Pt enters building independently.  Treatment took place in water 3.8 to  4 ft 8 in.feet deep depending upon activity.  Pt entered and exited the pool via stair and handrails    Aquatic Therapy:  Water walking for warm up fwd/lat/bkwds  Standing: Standing march Hamstring curl x20 BIL Hip abd/add x20 BIL Hip Circles CC/CCW 2x10 each BIL SL heel raises - x20 Hip ext/flex with knee straight x 20 BIL Yellow noodle stomp x20 ea Water jogging with yellow dumbells - 3 laps [place] - 3 laps HS stretch with yellow noodle Squats x20 Lunge walk fwd 2 laps  Pt requires the buoyancy of water for active assisted exercises with buoyancy supported for strengthening and AROM exercises. Hydrostatic pressure also supports joints by unweighting joint load by at least 50 % in 3-4 feet depth water. 80% in chest to neck deep water. Water will provide assistance with  movement using the current and laminar flow while the buoyancy reduces weight bearing. Pt requires the viscosity of the water for resistance with strengthening exercises.   [facility] Adult PT Treatment:  DATE: 12/04/2022 Therapeutic Exercise: NuStep L5 x 6 min with UE/LE while taking subjective Quad set with ball under knee 2 x 10 each SLR 2 x 10 each Bridge 2 x 10 Supine clamshell with blue 2 x 10 Sidelying hip abduction 2 x 10 each LAQ with 2# 2 x 15 each Seated hamstring curl with blue 2 x 10 each Sit to stand 3 x 10 Standing heel raises 2 x 10   [facility] Adult PT Treatment:                                                DATE: 11/10/2022 Therapeutic Exercise: SLR x 10 each Bridge x 10 Sidelying hip abduction x 10 each LAQ x 10 each Sit to stand x 10  PATIENT EDUCATION:  Education details: HEP Person educated: Patient Education method: Programmer, multimedia, Demonstration, Actor cues, Verbal cues Education comprehension: Verbalized understanding, returned demonstration, verbal cues required, tactile cues required, and needs further education  HOME EXERCISE PROGRAM: Access Code: [email or web address]    ASSESSMENT: CLINICAL IMPRESSION: Session today rechecked 6 MWT with improvement from 700 feet to 824 feet. Her pain level is rated at 6/10 which is decreased since evaluation (8/10). She admits to being active everyday and committed to attending all her PT appointments. She sees MD to discuss TKA on 12/24/22. Increased closed chain focus today which she tolerated well with min complaints. Most complaints are with walking. SLS 3-5 sec at best bilateral.  Patient continues to benefit from skilled PT services on land and aquatic based and should be progressed as able to improve functional independence.    OBJECTIVE IMPAIRMENTS: Abnormal gait, decreased activity tolerance, decreased balance, difficulty walking, decreased ROM, decreased strength, improper body  mechanics, and pain.   ACTIVITY LIMITATIONS: bending, standing, squatting, stairs, and locomotion level  PARTICIPATION LIMITATIONS: meal prep, cleaning, shopping, community activity, and occupation  PERSONAL FACTORS: Fitness, Past/current experiences, and Time since onset of injury/illness/exacerbation are also affecting patient's functional outcome.    GOALS: Goals reviewed with patient? Yes  SHORT TERM GOALS: Target date: 12/08/2022  Patient will be I with initial HEP in order to progress with therapy. Baseline: HEP provided at eval 12/04/2022: independent with initial HEP Goal status: MET  2.  Patient will report knee pain </= 5/10 in order to reduce functional limitations Baseline:  12/04/2022: 8/10  12/11/22: 6/10 Goal status: ONGOING  3.  Patient will perform 5xSTS </= 15 seconds to indicate improvement in LE strength and reduction of fall risk Baseline: 22 seconds 12/04/2022: 9 seconds Goal status: MET  LONG TERM GOALS: Target date: 01/05/2023  Patient will be I with final HEP to maintain progress from PT. Baseline: HEP provided at eval Goal status: INITIAL  2.  Patient will report KOOS Jr </= 2/28 in order to indicate reduced pain and improvement in knee function Baseline: 16/28 Goal status: INITIAL  3.  Patient will demonstrate bilateral knee extension strength 5/5 MMT in order to improve walking tolerance reduce feeling of knees giving out Baseline: 4/5 MMT Goal status: INITIAL  4.  Patient will ambulate >/= 1000 ft with in order to improve community access and ability to go shopping without limitation Baseline: 700 ft 12/11/22: 824 Feet  Goal status: ONGOING   PLAN: PT FREQUENCY: 1-2x/week  PT DURATION: 8 weeks  PLANNED INTERVENTIONS: Therapeutic exercises, Therapeutic activity, Neuromuscular re-education, Balance training, Gait  training, Patient/Family education, Self Care, Joint mobilization, Aquatic Therapy, Dry Needling, Electrical stimulation,  Cryotherapy, Moist heat, Ionotophoresis /ml Dexamethasone, Manual therapy, and Re-evaluation  PLAN FOR NEXT SESSION: Review HEP and progress PRN, progress quad and hip strengthening, progress to closed chain exercises, balance training   Royden Purl PTA 12/11/22  11:21 AM Phone: [phone] Fax: [phone]

## 2022-12-13 ENCOUNTER — Ambulatory Visit: Payer: 59 | Admitting: Physical Therapy

## 2022-12-13 ENCOUNTER — Encounter: Payer: Self-pay | Admitting: Physical Therapy

## 2022-12-13 DIAGNOSIS — R2689 Other abnormalities of gait and mobility: Secondary | ICD-10-CM | POA: Diagnosis not present

## 2022-12-13 DIAGNOSIS — M25561 Pain in right knee: Secondary | ICD-10-CM | POA: Diagnosis not present

## 2022-12-13 DIAGNOSIS — G8929 Other chronic pain: Secondary | ICD-10-CM

## 2022-12-13 DIAGNOSIS — M6281 Muscle weakness (generalized): Secondary | ICD-10-CM

## 2022-12-13 DIAGNOSIS — M25562 Pain in left knee: Secondary | ICD-10-CM | POA: Diagnosis not present

## 2022-12-13 NOTE — Therapy (Signed)
OUTPATIENT PHYSICAL THERAPY TREATMENT   Patient Name: Caroline Ryan MRN: [id number] DOB:1966-04-29, 57 y.o., female Today's Date: 12/13/2022   PCP: Warrick Parisian REFERRING PROVIDER: [name], MD   END OF SESSION:  PT End of Session - 12/13/22 1402     Visit Number 6    Number of Visits 17    Date for PT Re-Evaluation 01/05/23    Authorization Type [employer] / [facility] MCD    Authorization - Number of Visits 27    PT Start Time 1400    PT Stop Time 1442    PT Time Calculation (min) 42 min                Past Medical History:  Diagnosis Date   Anemia    Ascending aorta dilatation    38mm by echo 07/2021 and 40mm by echo 07/2022   Asthma    Depression    no meds   Eczema    Fibroid    Headache(784.0)    otc prn med   History of blood transfusion    Hypertension    Knee pain, bilateral    arthralgia knee pain bilateral   Lower extremity edema    Mitral regurgitation    Mild to moderate by echo 07/2021   Seasonal allergies    SVD (spontaneous vaginal delivery)    x 3   Vaginal polyp 05/2020   Past Surgical History:  Procedure Laterality Date   ABDOMINAL HYSTERECTOMY     BILATERAL SALPINGECTOMY Bilateral 10/10/2017   Procedure: BILATERAL SALPINGECTOMY;  Surgeon: Hermina Staggers, MD;  Location: [facility];  Service: Gynecology;  Laterality: Bilateral;   BREAST BIOPSY     US guided core 2009   BREAST EXCISIONAL BIOPSY     right 1982 left 1981   BREAST SURGERY     bilateral cysts/benign   COLONOSCOPY  10/17/2021   VAGINAL HYSTERECTOMY N/A 10/10/2017   Procedure: HYSTERECTOMY VAGINAL;  Surgeon: Hermina Staggers, MD;  Location: [facility];  Service: Gynecology;  Laterality: N/A;   Patient Active Problem List   Diagnosis Date Noted   Severe major depression, single episode 08/21/2022   Chronic pain of both knees 05/16/2022   OSA (obstructive sleep apnea) 11/09/2021   Mitral regurgitation 09/07/2021   Dilated aortic root 09/07/2021    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/2020   Unilateral primary osteoarthritis, left knee 10/22/2019   Unilateral primary osteoarthritis, right knee 10/22/2019   BMI 50.0-59.9, adult 10/22/2019   Grieving 06/10/2019   Pruritus 06/10/2019   Lower extremity edema 11/02/2018   Post-operative state 10/10/2017   Symptomatic anemia 05/20/2017   Arthralgia of both knees 06/27/2015   BACK STRAIN, LUMBAR 03/16/2010   KNEE PAIN, BILATERAL 06/30/2009   OBESITY 04/13/2009   Edema 04/13/2009   FIBROADENOMA, BREAST 04/20/2008   HYPERTENSION, BENIGN ESSENTIAL 04/14/2008   Allergic rhinitis 04/14/2008   Asthma 04/14/2008    REFERRING DIAG: Bilateral primary osteoarthritis of knee  THERAPY DIAG:  Chronic pain of both knees  Muscle weakness (generalized)  Other abnormalities of gait and mobility  Chronic pain of left knee  Chronic pain of right knee  Rationale for Evaluation and Treatment: Rehabilitation  PRECAUTIONS: None  PERTINENT HISTORY: See PMH above    SUBJECTIVE:  SUBJECTIVE STATEMENT: Pt states her knees are a bit more sore than normal today since working out on tuesday  PAIN:  Are you having pain? Yes:  NPRS scale: 8/10 Pain location: Bilateral knee Pain description: Achy, burning, sore  Aggravating factors: Bending down or getting on knees, walking, standing, stairs Relieving factors: Medication   OBJECTIVE:  PATIENT SURVEYS:  KOOS Jr.: 16/28  POSTURE:   Varus knee alignment  PALPATION: Tenderness to parapatellar region bilaterally  LOWER EXTREMITY ROM:  Active ROM Right eval Left eval  Hip flexion    Hip extension    Hip abduction    Hip adduction    Hip internal rotation    Hip external rotation    Knee flexion 102 110  Knee extension 0 0  Ankle dorsiflexion    Ankle plantarflexion    Ankle inversion    Ankle eversion     (Blank rows = not tested)  LOWER EXTREMITY MMT:  MMT Right eval Left eval  Hip flexion    Hip extension    Hip abduction     Hip adduction    Hip internal rotation    Hip external rotation    Knee flexion 5 5  Knee extension 4 4  Ankle dorsiflexion    Ankle plantarflexion    Ankle inversion    Ankle eversion     (Blank rows = not tested)  FUNCTIONAL TESTS:  5 times sit to stand: 22 seconds  12/04/2022: 9 seconds 6 minute walk test: 700 ft with 1 seated rest break for 30 sec at 3:20-2:50  GAIT: Distance walked: 700 ft Assistive device utilized: None Level of assistance: Complete Independence Comments: Trendelenburg, varus alignment   TODAY'S TREATMENT:     TREATMENT 12/13/22:  Aquatic therapy at [facility]- [address] - therapeutic pool temp 92 degrees Pt enters building independently.  Treatment took place in water 3.8 to  4 ft 8 in.feet deep depending upon activity.  Pt entered and exited the pool via stair and handrails    Aquatic Therapy:  Water walking for warm up fwd/lat/bkwds  Standing: Walking march - 3 laps Hamstring curl 2x20 BIL Hip abd/add 2x20 BIL Hip Circles CC/CCW 2x10 each BIL SL heel raises - x30 Hip ext/flex with knee straight x 20 BIL Yellow noodle stomp x20 ea Water jogging with yellow dumbells - 3 laps Grapevine - 3 laps HS stretch with yellow noodle Squats 2x20 Step ups 2x10 ea Lunge walk fwd 2 laps  Pt requires the buoyancy of water for active assisted exercises with buoyancy supported for strengthening and AROM exercises. Hydrostatic pressure also supports joints by unweighting joint load by at least 50 % in 3-4 feet depth water. 80% in chest to neck deep water. Water will provide assistance with movement using the current and laminar flow while the buoyancy reduces weight bearing. Pt requires the viscosity of the water for resistance with strengthening exercises.   [facility] Adult PT Treatment:                                                DATE: 12/04/2022 Therapeutic Exercise: NuStep L5 x 6 min with UE/LE while taking subjective Quad set with ball under  knee 2 x 10 each SLR 2 x 10 each Bridge 2 x 10 Supine clamshell with blue 2 x 10 Sidelying hip abduction 2 x 10 each LAQ with 2# 2 x 15 each Seated hamstring curl with blue 2 x 10 each Sit to stand 3 x 10 Standing heel raises 2 x 10   [facility] Adult PT Treatment:  DATE: 11/10/2022 Therapeutic Exercise: SLR x 10 each Bridge x 10 Sidelying hip abduction x 10 each LAQ x 10 each Sit to stand x 10  PATIENT EDUCATION:  Education details: HEP Person educated: Patient Education method: Programmer, multimedia, Demonstration, Tactile cues, Verbal cues Education comprehension: Verbalized understanding, returned demonstration, verbal cues required, tactile cues required, and needs further education  HOME EXERCISE PROGRAM: Access Code: [email or web address]    ASSESSMENT: CLINICAL IMPRESSION: Session today focused on knee and hip strengthening in the aquatic environment for use of buoyancy to offload joints and the viscosity of water as resistance during therapeutic exercise.  Pt able to significantly increase volume of exercise today with no increase in sx.  Shows improved coordination with grapevine exercise today.  Patient was able to tolerate all prescribed exercises in the aquatic environment with no adverse effects and reports 6/10 pain at the end of the session. Patient continues to benefit from skilled PT services on land and aquatic based and should be progressed as able to improve functional independence.    OBJECTIVE IMPAIRMENTS: Abnormal gait, decreased activity tolerance, decreased balance, difficulty walking, decreased ROM, decreased strength, improper body mechanics, and pain.   ACTIVITY LIMITATIONS: bending, standing, squatting, stairs, and locomotion level  PARTICIPATION LIMITATIONS: meal prep, cleaning, shopping, community activity, and occupation  PERSONAL FACTORS: Fitness, Past/current experiences, and Time since onset of injury/illness/exacerbation are  also affecting patient's functional outcome.    GOALS: Goals reviewed with patient? Yes  SHORT TERM GOALS: Target date: 12/08/2022  Patient will be I with initial HEP in order to progress with therapy. Baseline: HEP provided at eval 12/04/2022: independent with initial HEP Goal status: MET  2.  Patient will report knee pain </= 5/10 in order to reduce functional limitations Baseline:  12/04/2022: 8/10  Goal status: ONGOING  3.  Patient will perform 5xSTS </= 15 seconds to indicate improvement in LE strength and reduction of fall risk Baseline: 22 seconds 12/04/2022: 9 seconds Goal status: MET  LONG TERM GOALS: Target date: 01/05/2023  Patient will be I with final HEP to maintain progress from PT. Baseline: HEP provided at eval Goal status: INITIAL  2.  Patient will report KOOS Jr </= 2/28 in order to indicate reduced pain and improvement in knee function Baseline: 16/28 Goal status: INITIAL  3.  Patient will demonstrate bilateral knee extension strength 5/5 MMT in order to improve walking tolerance reduce feeling of knees giving out Baseline: 4/5 MMT Goal status: INITIAL  4.  Patient will ambulate >/= 1000 ft with in order to improve community access and ability to go shopping without limitation Baseline: 700 ft Goal status: INITIAL   PLAN: PT FREQUENCY: 1-2x/week  PT DURATION: 8 weeks  PLANNED INTERVENTIONS: Therapeutic exercises, Therapeutic activity, Neuromuscular re-education, Balance training, Gait training, Patient/Family education, Self Care, Joint mobilization, Aquatic Therapy, Dry Needling, Electrical stimulation, Cryotherapy, Moist heat, Ionotophoresis /ml Dexamethasone, Manual therapy, and Re-evaluation  PLAN FOR NEXT SESSION: Review HEP and progress PRN, progress quad and hip strengthening, progress to closed chain exercises, balance training   Fredderick Phenix PT 12/13/22  2:46 PM Phone: [phone] Fax: [phone]

## 2022-12-14 ENCOUNTER — Encounter: Payer: Self-pay | Admitting: Cardiology

## 2022-12-14 ENCOUNTER — Ambulatory Visit: Payer: 59 | Attending: Cardiology | Admitting: Cardiology

## 2022-12-14 ENCOUNTER — Other Ambulatory Visit: Payer: Self-pay

## 2022-12-14 VITALS — BP 102/70 | HR 71 | Ht 61.0 in | Wt 249.0 lb

## 2022-12-14 DIAGNOSIS — R600 Localized edema: Secondary | ICD-10-CM

## 2022-12-14 DIAGNOSIS — I10 Essential (primary) hypertension: Secondary | ICD-10-CM | POA: Diagnosis not present

## 2022-12-14 DIAGNOSIS — I77810 Thoracic aortic ectasia: Secondary | ICD-10-CM | POA: Diagnosis not present

## 2022-12-14 DIAGNOSIS — G4733 Obstructive sleep apnea (adult) (pediatric): Secondary | ICD-10-CM

## 2022-12-14 DIAGNOSIS — R9431 Abnormal electrocardiogram [ECG] [EKG]: Secondary | ICD-10-CM | POA: Diagnosis not present

## 2022-12-14 DIAGNOSIS — I340 Nonrheumatic mitral (valve) insufficiency: Secondary | ICD-10-CM

## 2022-12-14 NOTE — Patient Instructions (Signed)
Medication Instructions:  Your physician recommends that you continue on your current medications as directed. Please refer to the Current Medication list given to you today.  *If you need a refill on your cardiac medications before your next appointment, please call your pharmacy*   Lab Work: None.  If you have labs (blood work) drawn today and your tests are completely normal, you will receive your results only by: MyChart Message (if you have MyChart) OR A paper copy in the mail If you have any lab test that is abnormal or we need to change your treatment, we will call you to review the results.   Testing/Procedures: Your physician has requested that you have an echocardiogram. Echocardiography is a painless test that uses sound waves to create images of your heart. It provides your doctor with information about the size and shape of your heart and how well your heart's chambers and valves are working. This procedure takes approximately one hour. There are no restrictions for this procedure. Please do NOT wear cologne, perfume, aftershave, or lotions (deodorant is allowed). Please arrive 15 minutes prior to your appointment time.    Follow-Up: At [facility], you and your health needs are our priority.  As part of our continuing mission to provide you with exceptional heart care, we have created designated Provider Care Teams.  These Care Teams include your primary Cardiologist (physician) and Advanced Practice Providers (APPs -  Physician Assistants and Nurse Practitioners) who all work together to provide you with the care you need, when you need it.  We recommend signing up for the patient portal called "MyChart".  Sign up information is provided on this After Visit Summary.  MyChart is used to connect with patients for Virtual Visits (Telemedicine).  Patients are able to view lab/test results, encounter notes, upcoming appointments, etc.  Non-urgent messages can be sent to  your provider as well.   To learn more about what you can do with MyChart, go to [email or web address].    Your next appointment:   1 year(s)  Provider:   Traci Turner, MD     

## 2022-12-14 NOTE — Progress Notes (Signed)
Cardiology  Note    Date:  12/14/2022   ID:  Caroline Ryan, DOB May 26, 1966, MRN [id number]  PCP:  Ivonne Andrew, NP  Cardiologist:  Armanda Magic, MD   Chief Complaint  Patient presents with   Hypertension   Mitral Regurgitation     History of Present Illness:  Caroline Ryan is a 57 y.o. female  with a hx of asthma, anemia and HTN and LE edema . She was found to have an abnormal EKG with LVH by voltage and LAFB.  She has been having problems with LE edema since she started having problems with her knees.  She was started on Lasix 20mg  daily which has helped with her edema.  She is on Gabapentin and the dose was increased  and her LE edema has persisted.  She also has issues with blood pressure being poorly controlled and her lisinopril HCT was stopped.  She was started on lisinopril 40 mg daily as continued on Lasix 20 mg daily.  I  recommended she come off gabapentin which could have been contributing to her edema.   2D echo done 08/03/2021 showed normal LV function with EF 60 to 65% with mildly dilated LV normal diastolic function, mild to moderate MR and borderline dilated aortic root at 38 mm.  2D echo done 08/08/2022 showed EF 55 to 60% with mild LVH, G1DD, mild left atrial enlargement, mild MR and mildly dilated ascending aorta at 40 mm.  She underwent outpatient sleep study on 09/24/2021 which demonstrated moderate obstructive sleep apnea with an AHI of 22/h and severe oxygen desaturations as low as 56%.  She underwent CPAP titration on 11/09/2021 to 16 cm H2O.    She is here today for followup and is doing well.  She did have PNA a few weeks ago and is still recovering. She has been using an inhaler but her SOB has improved. She denies any chest pain or pressure,  PND, orthopnea, LE edema or syncope. Rarely she will have a skipped heart beat but nothing sustained.  She has had some problems with vertigo recently.  She is compliant with her meds and is tolerating meds  with no SE.    She is doing well with her PAP device and thinks that she has gotten used to it.  She tolerates the mask and feels the pressure is adequate.  Since going on PAP she feels rested in the am and has no significant daytime sleepiness.  She denies any significant mouth or nasal dryness or nasal congestion.  She does not think that he snores.     Past Medical History:  Diagnosis Date   Anemia    Ascending aorta dilatation (HCC)    38mm by echo 07/2021 and 40mm by echo 07/2022   Asthma    Depression    no meds   Eczema    Fibroid    Headache(784.0)    otc prn med   History of blood transfusion    Hypertension    Knee pain, bilateral    arthralgia knee pain bilateral   Lower extremity edema    Mitral regurgitation    Mild to moderate by echo 07/2021   Seasonal allergies    SVD (spontaneous vaginal delivery)    x 3   Vaginal polyp 05/2020    Past Surgical History:  Procedure Laterality Date   ABDOMINAL HYSTERECTOMY     BILATERAL SALPINGECTOMY Bilateral 10/10/2017   Procedure: BILATERAL SALPINGECTOMY;  Surgeon: Alysia Penna,  [name], MD;  Location: [facility];  Service: Gynecology;  Laterality: Bilateral;   BREAST BIOPSY     US guided core 2009   BREAST EXCISIONAL BIOPSY     right 1982 left 1981   BREAST SURGERY     bilateral cysts/benign   COLONOSCOPY  10/17/2021   VAGINAL HYSTERECTOMY N/A 10/10/2017   Procedure: HYSTERECTOMY VAGINAL;  Surgeon: Hermina Staggers, MD;  Location: [facility];  Service: Gynecology;  Laterality: N/A;    Current Medications: No outpatient medications have been marked as taking for the 12/14/22 encounter (Office Visit) with [name], MD.   Current Facility-Administered Medications for the 12/14/22 encounter (Office Visit) with [name], MD  Medication   Triamcinolone Acetonide (ZILRETTA) intra-articular injection 32 mg   Triamcinolone Acetonide (ZILRETTA) intra-articular injection 32 mg    Allergies:   Patient has no known  allergies.   Social History   Socioeconomic History   Marital status: Single    Spouse name: Not on file   Number of children: Not on file   Years of education: Not on file   Highest education level: Not on file  Occupational History   Not on file  Tobacco Use   Smoking status: Former    Packs/day: 0.25    Years: 2.00    Additional pack years: 0.00    Total pack years: 0.50    Types: Cigarettes    Quit date: 08/20/2014    Years since quitting: 8.3   Smokeless tobacco: Never  Vaping Use   Vaping Use: Never used  Substance and Sexual Activity   Alcohol use: Yes    Alcohol/week: 2.0 standard drinks of alcohol    Types: 2 Glasses of wine per week   Drug use: No   Sexual activity: Yes    Birth control/protection: Condom  Other Topics Concern   Not on file  Social History Narrative   Not on file   Social Determinants of Health   Financial Resource Strain: High Risk (08/21/2022)   Overall Financial Resource Strain (CARDIA)    Difficulty of Paying Living Expenses: Very hard  Food Insecurity: Food Insecurity Present (08/21/2022)   Hunger Vital Sign    Worried About Running Out of Food in the Last Year: Often true    Ran Out of Food in the Last Year: Often true  Transportation Needs: No Transportation Needs (08/21/2022)   PRAPARE - Administrator, Civil Service (Medical): No    Lack of Transportation (Non-Medical): No  Physical Activity: Inactive (08/21/2022)   Exercise Vital Sign    Days of Exercise per Week: 0 days    Minutes of Exercise per Session: 0 min  Stress: Stress Concern Present (07/27/2022)   [employer] of Occupational Health - Occupational Stress Questionnaire    Feeling of Stress : Very much  Social Connections: Socially Isolated (08/21/2022)   Social Connection and Isolation Panel [NHANES]    Frequency of Communication with Friends and Family: More than three times a week    Frequency of Social Gatherings with Friends and Family: Never    Attends  Religious Services: Never    Database administrator or Organizations: No    Attends Engineer, structural: Never    Marital Status: Never married     Family History:  The patient's family history includes Breast cancer (age of onset: 73) in her mother; Diabetes in her brother and brother; Hypertension in her father.   ROS:   Please  see the history of present illness.    ROS All other systems reviewed and are negative.      No data to display           PHYSICAL EXAM:   VS:  LMP 04/03/2017 (Approximate)    GEN: Well nourished, well developed in no acute distress HEENT: Normal NECK: No JVD; No carotid bruits LYMPHATICS: No lymphadenopathy CARDIAC:RRR, no murmurs, rubs, gallops RESPIRATORY:  Clear to auscultation without rales, wheezing or rhonchi  ABDOMEN: Soft, non-tender, non-distended MUSCULOSKELETAL:  No edema; No deformity  SKIN: Warm and dry NEUROLOGIC:  Alert and oriented x 3 PSYCHIATRIC:  Normal affect  Wt Readings from Last 3 Encounters:  11/12/22 249 lb (112.9 kg)  10/09/22 250 lb (113.4 kg)  09/10/22 250 lb 6.4 oz (113.6 kg)      Studies/Labs Reviewed:   EKG:  EKG is not ordered today.    Recent Labs: 05/14/2022: ALT 18 10/23/2022: B Natriuretic Peptide 95.5; BUN 19; Creatinine, Ser 0.85; Hemoglobin 9.5; Platelets 141; Potassium 4.2; Sodium 135   Lipid Panel    Component Value Date/Time   CHOL 201 (H) 09/08/2021 1229   TRIG 81 09/08/2021 1229   HDL 67 09/08/2021 1229   CHOLHDL 3.0 09/08/2021 1229   CHOLHDL 2.0 06/27/2015 1216   VLDL 9 06/27/2015 1216   LDLCALC 119 (H) 09/08/2021 1229    Additional studies/ records that were reviewed today include:  OV notes from PCP    ASSESSMENT:    1. Leg edema   2. Nonspecific abnormal electrocardiogram (ECG) (EKG)   3. Essential hypertension, benign   4. OSA (obstructive sleep apnea)   5. Nonrheumatic mitral valve regurgitation   6. Ascending aorta dilation (HCC)       PLAN:  In order of  problems listed above:  LE edema -I suspect this is multifactorial related to morbid obesity, gabapentin side effect and dietary indiscretion with Na intake as well as intermittent NSAID use -dx recently with early lymphedema -No edema is present on exam today -2D echo 08/08/2021 showed normal LV function with normal diastolic function and mild to moderate MR and repeat echo 08/08/2022 showed EF 55 to 60% with mild LVH and grade 1 diastolic dysfunction, mild left atrial enlargement, mild MR and mildly dilated aortic root and ascending a aorta at 40 mm -encouraged her to follow a < 2gm Na diet -Continue prescription drug management with Lasix 20 mg daily and Aldactone 100 mg daily with as needed refills -I have personally reviewed and interpreted outside labs performed by patient's PCP which showed serum creatinine 0.85 and potassium 4.2 on 10/23/2022  Abnormal EKG -there is LVH by voltage -2D echo showed normal LV function with EF 60 to 65% and no LVH.  HTN -BP controlled on exam today -Continue prescription drug management with spironolactone 100 mg daily, olmesartan 40 mg daily, carvedilol 25 mg twice daily with as needed refills  OSA - The patient is tolerating PAP therapy well without any problems.The patient has been using and benefiting from PAP use and will continue to benefit from therapy.  -she stopped using it for the past month because she had PNA and could not use it -I encouraged her to get back on the CPAP and be compliant  Mitral regurgitation -Mild to moderate by 2D echocardiogram done 08/03/2021 -repeat echo 07/2022 with mild MR  Dilated ascending aorta -38mm by echo 12/22 -Repeat echo 08/08/2022 showed ascending aorta measuring 40 mm -Repeat limited echo in 1 year  Followup  with me in 1 year   Medication Adjustments/Labs and Tests Ordered: Current medicines are reviewed at length with the patient today.  Concerns regarding medicines are outlined above.  Medication  changes, Labs and Tests ordered today are listed in the Patient Instructions below.  There are no Patient Instructions on file for this visit.   Signed, Armanda Magic, MD  12/14/2022 11:11 AM    [facility] [address] [place], [place], [place]  [address] Phone: [phone]; Fax: [phone]

## 2022-12-14 NOTE — Addendum Note (Signed)
Addended by: Luellen Pucker on: 12/14/2022 11:32 AM   Modules accepted: Orders

## 2022-12-25 ENCOUNTER — Other Ambulatory Visit: Payer: Self-pay

## 2022-12-25 MED ORDER — PREDNISONE 10 MG PO TABS
ORAL_TABLET | ORAL | 0 refills | Status: AC
  Filled 2022-12-25 – 2023-01-02 (×2): qty 12, 6d supply, fill #0

## 2022-12-26 ENCOUNTER — Ambulatory Visit: Payer: 59 | Attending: Physical Medicine and Rehabilitation | Admitting: Physical Therapy

## 2022-12-26 ENCOUNTER — Encounter: Payer: Self-pay | Admitting: Physical Therapy

## 2022-12-26 ENCOUNTER — Other Ambulatory Visit: Payer: Self-pay

## 2022-12-26 DIAGNOSIS — R2689 Other abnormalities of gait and mobility: Secondary | ICD-10-CM | POA: Insufficient documentation

## 2022-12-26 DIAGNOSIS — G8929 Other chronic pain: Secondary | ICD-10-CM | POA: Insufficient documentation

## 2022-12-26 DIAGNOSIS — M6281 Muscle weakness (generalized): Secondary | ICD-10-CM | POA: Insufficient documentation

## 2022-12-26 DIAGNOSIS — M25561 Pain in right knee: Secondary | ICD-10-CM | POA: Diagnosis not present

## 2022-12-26 DIAGNOSIS — M25562 Pain in left knee: Secondary | ICD-10-CM | POA: Diagnosis not present

## 2022-12-26 NOTE — Therapy (Signed)
OUTPATIENT PHYSICAL THERAPY TREATMENT   Patient Name: Caroline Ryan MRN: [id number] DOB:[birth year], 57 y.o., female Today's Date: 12/26/2022   PCP: Warrick Parisian REFERRING PROVIDER: [name], MD   END OF SESSION:  PT End of Session - 12/26/22 1253     Visit Number 7    Number of Visits 17    Date for PT Re-Evaluation 01/05/23    Authorization Type [employer] / [facility] MCD    Authorization - Number of Visits 27    PT Start Time 1315    PT Stop Time 1355    PT Time Calculation (min) 40 min                Past Medical History:  Diagnosis Date   Anemia    Ascending aorta dilatation (HCC)    38mm by echo 07/2021 and 40mm by echo 07/2022   Asthma    Depression    no meds   Eczema    Fibroid    Headache(784.0)    otc prn med   History of blood transfusion    Hypertension    Knee pain, bilateral    arthralgia knee pain bilateral   Lower extremity edema    Mitral regurgitation    Mild to moderate by echo 07/2021   Seasonal allergies    SVD (spontaneous vaginal delivery)    x 3   Vaginal polyp 05/2020   Past Surgical History:  Procedure Laterality Date   ABDOMINAL HYSTERECTOMY     BILATERAL SALPINGECTOMY Bilateral 10/10/2017   Procedure: BILATERAL SALPINGECTOMY;  Surgeon: Hermina Staggers, MD;  Location: [facility];  Service: Gynecology;  Laterality: Bilateral;   BREAST BIOPSY     US guided core 2009   BREAST EXCISIONAL BIOPSY     right 1982 left 1981   BREAST SURGERY     bilateral cysts/benign   COLONOSCOPY  10/17/2021   VAGINAL HYSTERECTOMY N/A 10/10/2017   Procedure: HYSTERECTOMY VAGINAL;  Surgeon: Hermina Staggers, MD;  Location: [facility];  Service: Gynecology;  Laterality: N/A;   Patient Active Problem List   Diagnosis Date Noted   Severe major depression, single episode (HCC) 08/21/2022   Chronic pain of both knees 05/16/2022   OSA (obstructive sleep apnea) 11/09/2021   Mitral regurgitation 09/07/2021   Dilated aortic root (HCC)  09/07/2021   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/2020   Unilateral primary osteoarthritis, left knee 10/22/2019   Unilateral primary osteoarthritis, right knee 10/22/2019   BMI 50.0-59.9, adult (HCC) 10/22/2019   Grieving 06/10/2019   Pruritus 06/10/2019   Lower extremity edema 11/02/2018   Post-operative state 10/10/2017   Symptomatic anemia 05/20/2017   Arthralgia of both knees 06/27/2015   BACK STRAIN, LUMBAR 03/16/2010   KNEE PAIN, BILATERAL 06/30/2009   OBESITY 04/13/2009   Edema 04/13/2009   FIBROADENOMA, BREAST 04/20/2008   HYPERTENSION, BENIGN ESSENTIAL 04/14/2008   Allergic rhinitis 04/14/2008   Asthma 04/14/2008    REFERRING DIAG: Bilateral primary osteoarthritis of knee  THERAPY DIAG:  Chronic pain of both knees  Muscle weakness (generalized)  Rationale for Evaluation and Treatment: Rehabilitation  PRECAUTIONS: None  PERTINENT HISTORY: See PMH above    SUBJECTIVE:  SUBJECTIVE STATEMENT: Pt states her knees are a bit more sore than normal today since working out on tuesday  PAIN:  Are you having pain? Yes:  NPRS scale: 8/10 Pain location: Bilateral knee Pain description: Achy, burning, sore Aggravating factors: Bending down or getting on knees, walking, standing, stairs Relieving factors: Medication  OBJECTIVE:  PATIENT SURVEYS:  KOOS Jr.: 16/28  POSTURE:   Varus knee alignment  PALPATION: Tenderness to parapatellar region bilaterally  LOWER EXTREMITY ROM:  Active ROM Right eval Left eval  Hip flexion    Hip extension    Hip abduction    Hip adduction    Hip internal rotation    Hip external rotation    Knee flexion 102 110  Knee extension 0 0  Ankle dorsiflexion    Ankle plantarflexion    Ankle inversion    Ankle eversion     (Blank rows = not tested)  LOWER EXTREMITY MMT:  MMT Right eval Left eval  Hip flexion    Hip extension    Hip abduction    Hip adduction    Hip internal rotation    Hip external rotation    Knee flexion  5 5  Knee extension 4 4  Ankle dorsiflexion    Ankle plantarflexion    Ankle inversion    Ankle eversion     (Blank rows = not tested)  FUNCTIONAL TESTS:  5 times sit to stand: 22 seconds  12/04/2022: 9 seconds   6 minute walk test: 700 ft with 1 seated rest break for 30 sec at 3:20-2:50 12/26/22 : 6 minute walk test 827 feet with SPC and no rest breaks.  GAIT: Distance walked: 700 ft Assistive device utilized: None Level of assistance: Complete Independence Comments: Trendelenburg, varus alignment   TODAY'S TREATMENT:    [facility] Adult PT Treatment:                                                DATE: 12/26/22 Therapeutic Exercise: Nustep 3 min, Rec bike 3 min  STS 10 x 3 6 inch step up x 10 each with 1 UE  SLR 10 x 1 each  Bridge 10 x 2   Therapeutic Activity: 6 minute walk test 827 feet     TREATMENT 12/13/22:  Aquatic therapy at [facility]- [address] - therapeutic pool temp 92 degrees Pt enters building independently.  Treatment took place in water 3.8 to  4 ft 8 in.feet deep depending upon activity.  Pt entered and exited the pool via stair and handrails    Aquatic Therapy:  Water walking for warm up fwd/lat/bkwds  Standing: Walking march - 3 laps Hamstring curl 2x20 BIL Hip abd/add 2x20 BIL Hip Circles CC/CCW 2x10 each BIL SL heel raises - x30 Hip ext/flex with knee straight x 20 BIL Yellow noodle stomp x20 ea Water jogging with yellow dumbells - 3 laps [place] - 3 laps HS stretch with yellow noodle Squats 2x20 Step ups 2x10 ea Lunge walk fwd 2 laps  Pt requires the buoyancy of water for active assisted exercises with buoyancy supported for strengthening and AROM exercises. Hydrostatic pressure also supports joints by unweighting joint load by at least 50 % in 3-4 feet depth water. 80% in chest to neck deep water. Water will provide assistance with movement using the current and laminar flow while the buoyancy reduces weight bearing. Pt requires the  viscosity of the water for resistance with strengthening exercises.   [facility] Adult PT Treatment:  DATE: 12/04/2022 Therapeutic Exercise: NuStep L5 x 6 min with UE/LE while taking subjective Quad set with ball under knee 2 x 10 each SLR 2 x 10 each Bridge 2 x 10 Supine clamshell with blue 2 x 10 Sidelying hip abduction 2 x 10 each LAQ with 2# 2 x 15 each Seated hamstring curl with blue 2 x 10 each Sit to stand 3 x 10 Standing heel raises 2 x 10   [facility] Adult PT Treatment:                                                DATE: 11/10/2022 Therapeutic Exercise: SLR x 10 each Bridge x 10 Sidelying hip abduction x 10 each LAQ x 10 each Sit to stand x 10  PATIENT EDUCATION:  Education details: HEP Person educated: Patient Education method: Programmer, multimedia, Demonstration, Actor cues, Verbal cues Education comprehension: Verbalized understanding, returned demonstration, verbal cues required, tactile cues required, and needs further education  HOME EXERCISE PROGRAM: Access Code: [email or web address]    ASSESSMENT: CLINICAL IMPRESSION: Session today focused on conditioning, gait distance and knee/hip strengthening. Pt arrives with 5/10 pain and reports she is feeling a little better. She saw surgeon today and and declined a knee injection. Performed multiple closed chain activities including step ups, STS and gait with good tolerance. She has met STG# 2.  Patient continues to benefit from skilled PT services on land and aquatic based and should be progressed as able to improve functional independence.    OBJECTIVE IMPAIRMENTS: Abnormal gait, decreased activity tolerance, decreased balance, difficulty walking, decreased ROM, decreased strength, improper body mechanics, and pain.   ACTIVITY LIMITATIONS: bending, standing, squatting, stairs, and locomotion level  PARTICIPATION LIMITATIONS: meal prep, cleaning, shopping, community activity, and  occupation  PERSONAL FACTORS: Fitness, Past/current experiences, and Time since onset of injury/illness/exacerbation are also affecting patient's functional outcome.    GOALS: Goals reviewed with patient? Yes  SHORT TERM GOALS: Target date: 12/08/2022  Patient will be I with initial HEP in order to progress with therapy. Baseline: HEP provided at eval 12/04/2022: independent with initial HEP Goal status: MET  2.  Patient will report knee pain </= 5/10 in order to reduce functional limitations Baseline:  12/04/2022: 8/10  12/26/22: 5/10  Goal status: MET  3.  Patient will perform 5xSTS </= 15 seconds to indicate improvement in LE strength and reduction of fall risk Baseline: 22 seconds 12/04/2022: 9 seconds Goal status: MET  LONG TERM GOALS: Target date: 01/05/2023  Patient will be I with final HEP to maintain progress from PT. Baseline: HEP provided at eval Goal status: INITIAL  2.  Patient will report KOOS Jr </= 2/28 in order to indicate reduced pain and improvement in knee function Baseline: 16/28 Goal status: INITIAL  3.  Patient will demonstrate bilateral knee extension strength 5/5 MMT in order to improve walking tolerance reduce feeling of knees giving out Baseline: 4/5 MMT Goal status: INITIAL  4.  Patient will ambulate >/= 1000 ft with in order to improve community access and ability to go shopping without limitation Baseline: 700 ft 12/26/22: 827 feet with SPC Goal status: MET   PLAN: PT FREQUENCY: 1-2x/week  PT DURATION: 8 weeks  PLANNED INTERVENTIONS: Therapeutic exercises, Therapeutic activity, Neuromuscular re-education, Balance training, Gait training, Patient/Family education, Self Care, Joint mobilization, Aquatic Therapy, Dry Needling, Electrical stimulation, Cryotherapy, Moist heat, Ionotophoresis  4mg /ml Dexamethasone, Manual therapy, and Re-evaluation  PLAN FOR NEXT SESSION: Review HEP and progress PRN, progress quad and hip strengthening, progress  to closed chain exercises, balance training   Royden Purl PTA 12/26/22  1:56 PM Phone: [phone] Fax: [phone]

## 2022-12-27 ENCOUNTER — Ambulatory Visit: Payer: 59 | Admitting: Physical Therapy

## 2022-12-31 ENCOUNTER — Other Ambulatory Visit: Payer: Self-pay

## 2023-01-01 ENCOUNTER — Encounter: Payer: Self-pay | Admitting: Physical Therapy

## 2023-01-01 ENCOUNTER — Other Ambulatory Visit: Payer: Self-pay

## 2023-01-01 ENCOUNTER — Ambulatory Visit (HOSPITAL_COMMUNITY): Payer: 59 | Admitting: Mental Health

## 2023-01-01 ENCOUNTER — Ambulatory Visit: Payer: 59 | Admitting: Physical Therapy

## 2023-01-01 DIAGNOSIS — M6281 Muscle weakness (generalized): Secondary | ICD-10-CM

## 2023-01-01 DIAGNOSIS — G8929 Other chronic pain: Secondary | ICD-10-CM

## 2023-01-01 DIAGNOSIS — R2689 Other abnormalities of gait and mobility: Secondary | ICD-10-CM

## 2023-01-01 DIAGNOSIS — M25561 Pain in right knee: Secondary | ICD-10-CM | POA: Diagnosis not present

## 2023-01-01 DIAGNOSIS — M25562 Pain in left knee: Secondary | ICD-10-CM | POA: Diagnosis not present

## 2023-01-01 NOTE — Therapy (Signed)
OUTPATIENT PHYSICAL THERAPY TREATMENT   Patient Name: Caroline Ryan MRN: [id number] DOB:[birth year], 57 y.o., female Today's Date: 01/01/2023   PCP: Warrick Parisian REFERRING PROVIDER: [name], MD   END OF SESSION:  PT End of Session - 01/01/23 1340     Visit Number 8    Number of Visits 17    Date for PT Re-Evaluation 02/12/23    Authorization Type [employer] / [employer] MCD    Authorization - Number of Visits 27    PT Start Time 1330    PT Stop Time 1410    PT Time Calculation (min) 40 min    Activity Tolerance Patient tolerated treatment well    Behavior During Therapy WFL for tasks assessed/performed                 Past Medical History:  Diagnosis Date   Anemia    Ascending aorta dilatation (HCC)    38mm by echo 07/2021 and 40mm by echo 07/2022   Asthma    Depression    no meds   Eczema    Fibroid    Headache(784.0)    otc prn med   History of blood transfusion    Hypertension    Knee pain, bilateral    arthralgia knee pain bilateral   Lower extremity edema    Mitral regurgitation    Mild to moderate by echo 07/2021   Seasonal allergies    SVD (spontaneous vaginal delivery)    x 3   Vaginal polyp 05/2020   Past Surgical History:  Procedure Laterality Date   ABDOMINAL HYSTERECTOMY     BILATERAL SALPINGECTOMY Bilateral 10/10/2017   Procedure: BILATERAL SALPINGECTOMY;  Surgeon: Hermina Staggers, MD;  Location: [facility];  Service: Gynecology;  Laterality: Bilateral;   BREAST BIOPSY     US guided core 2009   BREAST EXCISIONAL BIOPSY     right 1982 left 1981   BREAST SURGERY     bilateral cysts/benign   COLONOSCOPY  10/17/2021   VAGINAL HYSTERECTOMY N/A 10/10/2017   Procedure: HYSTERECTOMY VAGINAL;  Surgeon: Hermina Staggers, MD;  Location: [facility];  Service: Gynecology;  Laterality: N/A;   Patient Active Problem List   Diagnosis Date Noted   Severe major depression, single episode (HCC) 08/21/2022   Chronic pain of both knees  05/16/2022   OSA (obstructive sleep apnea) 11/09/2021   Mitral regurgitation 09/07/2021   Dilated aortic root (HCC) 09/07/2021   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/2020   Unilateral primary osteoarthritis, left knee 10/22/2019   Unilateral primary osteoarthritis, right knee 10/22/2019   BMI 50.0-59.9, adult (HCC) 10/22/2019   Grieving 06/10/2019   Pruritus 06/10/2019   Lower extremity edema 11/02/2018   Post-operative state 10/10/2017   Symptomatic anemia 05/20/2017   Arthralgia of both knees 06/27/2015   BACK STRAIN, LUMBAR 03/16/2010   KNEE PAIN, BILATERAL 06/30/2009   OBESITY 04/13/2009   Edema 04/13/2009   FIBROADENOMA, BREAST 04/20/2008   HYPERTENSION, BENIGN ESSENTIAL 04/14/2008   Allergic rhinitis 04/14/2008   Asthma 04/14/2008    REFERRING DIAG: Bilateral primary osteoarthritis of knee  THERAPY DIAG:  Chronic pain of both knees  Muscle weakness (generalized)  Other abnormalities of gait and mobility  Rationale for Evaluation and Treatment: Rehabilitation  PRECAUTIONS: None  PERTINENT HISTORY: See PMH above    SUBJECTIVE:  SUBJECTIVE STATEMENT: Pt states her knees are hurting more today because of the weather. She has been going up stairs.  PAIN:  Are you having pain? Yes:  NPRS scale: 8/10 Pain location: Bilateral knee Pain description: Achy, burning, sore Aggravating factors: Bending down or getting on knees, walking, standing, stairs Relieving factors: Medication   OBJECTIVE:  PATIENT SURVEYS:  KOOS Jr.: 16/28  01/01/2023: 16/28  POSTURE:   Varus knee alignment  PALPATION: Tenderness to parapatellar region bilaterally  LOWER EXTREMITY ROM:  Active ROM Right eval Left eval  Hip flexion    Hip extension    Hip abduction    Hip adduction    Hip internal rotation    Hip external rotation    Knee flexion 102 110  Knee extension 0 0  Ankle dorsiflexion    Ankle plantarflexion    Ankle inversion    Ankle eversion     (Blank rows = not  tested)  LOWER EXTREMITY MMT:  MMT Right eval Left eval Rt / Lt 01/01/2023  Hip flexion     Hip extension     Hip abduction     Hip adduction     Hip internal rotation     Hip external rotation     Knee flexion 5 5   Knee extension 4 4 4  / 4  Ankle dorsiflexion     Ankle plantarflexion     Ankle inversion     Ankle eversion      (Blank rows = not tested)  FUNCTIONAL TESTS:  5 times sit to stand: 22 seconds  12/04/2022: 9 seconds   6 minute walk test: 700 ft with 1 seated rest break for 30 sec at 3:20-2:50 12/26/22 : 6 minute walk test 827 feet with SPC and no rest breaks. 01/01/2023: 850 ft using SPC with SPC  GAIT: Distance walked: 700 ft Assistive device utilized: None Level of assistance: Complete Independence Comments: Trendelenburg, varus alignment   TODAY'S TREATMENT:    [facility] Adult PT Treatment:                                                DATE: 01/01/2023 Therapeutic Exercise: NuStep L5 x 6 min with UE/LE while taking subjective LAQ with 3# 2 x 15 each Seated hamstring curl with blue 2 x 10 each Sit to stand 2 x 10, holding 5# 2 x 10 Standing hip abduction 2 x 10 each Therapeutic Activity: 850 ft using [facility]   [facility] Adult PT Treatment:                                                DATE: 12/26/22 Therapeutic Exercise: Nustep 3 min, Rec bike 3 min  STS 10 x 3 6 inch step up x 10 each with 1 UE  SLR 10 x 1 each  Bridge 10 x 2  Therapeutic Activity: 6 minute walk test 827 feet   [facility] Adult PT Treatment:                                                DATE: 12/04/2022 Therapeutic Exercise: NuStep L5 x 6 min with UE/LE while taking subjective Quad set with ball under knee 2 x 10 each SLR 2 x 10 each Bridge 2  x 10 Supine clamshell with blue 2 x 10 Sidelying hip abduction 2 x 10 each LAQ with 2# 2 x 15 each Seated hamstring curl with blue 2 x 10 each Sit to stand 3 x 10 Standing heel raises 2 x 10  PATIENT EDUCATION:  Education details: POC  extension, HEP Person educated: Patient Education method: Explanation, Demonstration, Tactile cues, Verbal cues Education comprehension: Verbalized understanding, returned demonstration, verbal cues required, tactile cues required, and needs further education  HOME EXERCISE PROGRAM: Access Code: [email or web address]    ASSESSMENT: CLINICAL IMPRESSION: Patient tolerated therapy well with no adverse effects. She demonstrates improvement in her and is progressing well with her strengthening HEP, but she does continue to report knee pain and limitations in quad functional ability on KOOS Jr that has not improved since evaluation. Therapy continues to focus on progressing LE strength and she is progressing nicely with her exercises. No changes to HEP this visit. Patient would benefit from continued skilled PT to progress her mobility and strength to reduce pain and maximize functional ability, so will extend PT POC for 6 more weeks.    OBJECTIVE IMPAIRMENTS: Abnormal gait, decreased activity tolerance, decreased balance, difficulty walking, decreased ROM, decreased strength, improper body mechanics, and pain.   ACTIVITY LIMITATIONS: bending, standing, squatting, stairs, and locomotion level  PARTICIPATION LIMITATIONS: meal prep, cleaning, shopping, community activity, and occupation  PERSONAL FACTORS: Fitness, Past/current experiences, and Time since onset of injury/illness/exacerbation are also affecting patient's functional outcome.    GOALS: Goals reviewed with patient? Yes  SHORT TERM GOALS: Target date: 12/08/2022  Patient will be I with initial HEP in order to progress with therapy. Baseline: HEP provided at eval 12/04/2022: independent with initial HEP Goal status: MET  2.  Patient will report knee pain </= 5/10 in order to reduce functional limitations Baseline:  12/04/2022: 8/10  12/26/22: 5/10  Goal status: MET  3.  Patient will perform 5xSTS </= 15 seconds to indicate improvement in  LE strength and reduction of fall risk Baseline: 22 seconds 12/04/2022: 9 seconds Goal status: MET  LONG TERM GOALS: Target date: 02/12/2023  Patient will be I with final HEP to maintain progress from PT. Baseline: HEP provided at eval 01/01/2023: progressing Goal status: ONGOING  2.  Patient will report KOOS Jr </= 2/28 in order to indicate reduced pain and improvement in knee function Baseline: 16/28 01/01/2023: 16/28 Goal status: ONGOING  3.  Patient will demonstrate bilateral knee extension strength 5/5 MMT in order to improve walking tolerance reduce feeling of knees giving out Baseline: 4/5 MMT 01/01/2023: 4/5 MMT Goal status: ONGOING  4.  Patient will ambulate >/= 1000 ft with in order to improve community access and ability to go shopping without limitation Baseline: 700 ft 12/26/22: 827 feet with SPC 01/01/2023: 850 ft using SPC Goal status: ONGOING   PLAN: PT FREQUENCY: 1-2x/week  PT DURATION: 6 weeks  PLANNED INTERVENTIONS: Therapeutic exercises, Therapeutic activity, Neuromuscular re-education, Balance training, Gait training, Patient/Family education, Self Care, Joint mobilization, Aquatic Therapy, Dry Needling, Electrical stimulation, Cryotherapy, Moist heat, Ionotophoresis 4mg /ml Dexamethasone, Manual therapy, and Re-evaluation  PLAN FOR NEXT SESSION: Review HEP and progress PRN, progress quad and hip strengthening, progress to closed chain exercises, balance training   Rosana Hoes, PT, DPT, LAT, ATC 01/01/23  2:12 PM Phone: [phone] Fax: [phone]

## 2023-01-02 ENCOUNTER — Other Ambulatory Visit: Payer: Self-pay

## 2023-01-03 ENCOUNTER — Ambulatory Visit: Payer: 59 | Admitting: Physical Therapy

## 2023-01-04 ENCOUNTER — Other Ambulatory Visit: Payer: Self-pay

## 2023-01-04 ENCOUNTER — Other Ambulatory Visit: Payer: Self-pay | Admitting: Nurse Practitioner

## 2023-01-04 MED ORDER — CYCLOBENZAPRINE HCL 5 MG PO TABS
5.0000 mg | ORAL_TABLET | Freq: Three times a day (TID) | ORAL | 0 refills | Status: DC | PRN
  Filled 2023-01-04 – 2023-01-18 (×2): qty 30, 10d supply, fill #0

## 2023-01-07 ENCOUNTER — Other Ambulatory Visit: Payer: Self-pay

## 2023-01-08 DIAGNOSIS — Z0289 Encounter for other administrative examinations: Secondary | ICD-10-CM

## 2023-01-09 ENCOUNTER — Encounter (INDEPENDENT_AMBULATORY_CARE_PROVIDER_SITE_OTHER): Payer: Self-pay | Admitting: Family Medicine

## 2023-01-09 ENCOUNTER — Ambulatory Visit (INDEPENDENT_AMBULATORY_CARE_PROVIDER_SITE_OTHER): Payer: 59 | Admitting: Family Medicine

## 2023-01-09 VITALS — BP 140/89 | HR 77 | Temp 97.9°F | Ht 60.0 in | Wt 247.0 lb

## 2023-01-09 DIAGNOSIS — M25562 Pain in left knee: Secondary | ICD-10-CM

## 2023-01-09 DIAGNOSIS — Z6841 Body Mass Index (BMI) 40.0 and over, adult: Secondary | ICD-10-CM | POA: Diagnosis not present

## 2023-01-09 DIAGNOSIS — I10 Essential (primary) hypertension: Secondary | ICD-10-CM | POA: Diagnosis not present

## 2023-01-09 DIAGNOSIS — R7303 Prediabetes: Secondary | ICD-10-CM | POA: Diagnosis not present

## 2023-01-09 DIAGNOSIS — M25561 Pain in right knee: Secondary | ICD-10-CM

## 2023-01-09 NOTE — Progress Notes (Signed)
Office: [phone]  /  Fax: [phone]   Initial Visit  Caroline Ryan was seen in clinic today to evaluate for obesity. She is interested in losing weight to improve overall health and reduce the risk of weight related complications. She presents today to review program treatment options, initial physical assessment, and evaluation.     She was referred by: Specialist  When asked what else they would like to accomplish? She states: Improve energy levels and physical activity, Improve quality of life, and Improve appearance  Weight history:  referred in from Dr Luiz Blare for BMI reduction to have R then L knee replacement.  Her weight has been stable around 250 lb.  Her dad is 17 and her 3 kids are grown.  Has financial strain.  Out of work as a Lawyer.    When asked how has your weight affected you? She states: Has affected self-esteem, Contributed to medical problems, Contributed to orthopedic problems or mobility issues, Having fatigue, Having poor endurance, and Has affected mood   Some associated conditions: Hypertension and OSA  Contributing factors: Stress, Reduced physical activity, Eating patterns, and Menopause  Weight promoting medications identified: None  Current nutrition plan: None  Current level of physical activity: None and Other: water exercise  Current or previous pharmacotherapy: Is interested in pharmacotherapy  Response to medication: Never tried medications   Past medical history includes:   Past Medical History:  Diagnosis Date   Anemia    Ascending aorta dilatation (HCC)    38mm by echo 07/2021 and 40mm by echo 07/2022   Asthma    Depression    no meds   Eczema    Fibroid    Headache(784.0)    otc prn med   History of blood transfusion    Hypertension    Knee pain, bilateral    arthralgia knee pain bilateral   Lower extremity edema    Mitral regurgitation    Mild to moderate by echo 07/2021   Seasonal allergies    SVD (spontaneous  vaginal delivery)    x 3   Vaginal polyp 05/2020     Objective:   BP (!) 140/89   Pulse 77   Temp 97.9 F (36.6 C)   Ht 5' (1.524 m)   Wt 247 lb (112 kg)   LMP 04/03/2017 (Approximate)   SpO2 96%   BMI 48.24 kg/m  She was weighed on the bioimpedance scale: Body mass index is 48.24 kg/m.  Peak GNFAOZ:308 , Body Fat%:54.1, Visceral Fat Rating:20, Weight trend over the last 12 months: Decreasing  General:  Alert, oriented and cooperative. Patient is in no acute distress.  Respiratory: Normal respiratory effort, no problems with respiration noted   Gait: able to ambulate independently  Mental Status: Normal mood and affect. Normal behavior. Normal judgment and thought content.   DIAGNOSTIC DATA REVIEWED:  BMET    Component Value Date/Time   NA 135 10/23/2022 1311   NA 142 05/14/2022 1608   K 4.2 10/23/2022 1311   CL 104 10/23/2022 1311   CO2 22 10/23/2022 1311   GLUCOSE 150 (H) 10/23/2022 1311   BUN 19 10/23/2022 1311   BUN 24 05/14/2022 1608   CREATININE 0.85 10/23/2022 1311   CREATININE 0.75 06/20/2017 1201   CALCIUM 9.2 10/23/2022 1311   GFRNONAA >60 10/23/2022 1311   GFRNONAA 91 06/20/2017 1201   GFRAA 98 02/16/2020 1114   GFRAA 105 06/20/2017 1201   Lab Results  Component Value Date   HGBA1C 5.2  04/07/2020   HGBA1C 5.1 06/27/2015   No results found for: "INSULIN" CBC    Component Value Date/Time   WBC 10.9 (H) 10/23/2022 1119   RBC 3.04 (L) 10/23/2022 1119   HGB 9.5 (L) 10/23/2022 1119   HGB 11.0 (L) 05/14/2022 1608   HCT 32.2 (L) 10/23/2022 1119   HCT 34.4 05/14/2022 1608   PLT 141 (L) 10/23/2022 1119   PLT 183 05/14/2022 1608   MCV 105.9 (H) 10/23/2022 1119   MCV 96 05/14/2022 1608   MCH 31.3 10/23/2022 1119   MCHC 29.5 (L) 10/23/2022 1119   RDW 13.1 10/23/2022 1119   RDW 11.7 05/14/2022 1608   Iron/TIBC/Ferritin/ %Sat    Component Value Date/Time   IRON 16 (L) 09/14/2016 1111   TIBC 503 (H) 09/14/2016 1111   FERRITIN 2 (L) 09/14/2016  1111   IRONPCTSAT 3 (L) 09/14/2016 1111   Lipid Panel     Component Value Date/Time   CHOL 201 (H) 09/08/2021 1229   TRIG 81 09/08/2021 1229   HDL 67 09/08/2021 1229   CHOLHDL 3.0 09/08/2021 1229   CHOLHDL 2.0 06/27/2015 1216   VLDL 9 06/27/2015 1216   LDLCALC 119 (H) 09/08/2021 1229   Hepatic Function Panel     Component Value Date/Time   PROT 7.4 05/14/2022 1608   ALBUMIN 4.4 05/14/2022 1608   AST 16 05/14/2022 1608   ALT 18 05/14/2022 1608   ALKPHOS 107 05/14/2022 1608   BILITOT 0.4 05/14/2022 1608      Component Value Date/Time   TSH 1.530 12/24/2018 1404     Assessment and Plan:   Arthralgia of both knees Assessment & Plan: Bilat knee DJD has contributed to her weight gain and now she is working on BMI reduction to have bilateral TKRs with Dr Luiz Blare Her motivation is good and she is down 15 lb on her own She has added in water exercise 2-3 x a week  Continue water exercise with a goal of 3 x a week Begin active plan for weight reduction  with a target BMI <40 for TKRs   Morbid obesity (HCC) with starting BMI 48  Prediabetes Assessment & Plan: Lab Results  Component Value Date   HGBA1C 5.2 04/07/2020   A1c was most recently normal though she reports a hx of prediabetes She has never taken metformin  Plan to start a low sugar/ low starch diet and check A1c and fasting insulin with next labs   Primary hypertension Assessment & Plan: BP is elevated today She reports good compliance taking Carvedilol 25 mg bid, Olmesartan 40 mg daily, Spironolactone 100 mg daily She denies CP or HA  Look for BP improvements with weight reduction reduce intake of high sodium foods Take all medication as directed         Obesity Treatment / Action Plan:  Patient will work on garnering support from family and friends to begin weight loss journey. Will work on eliminating or reducing the presence of highly palatable, calorie dense foods in the home. Will complete  provided nutritional and psychosocial assessment questionnaire before the next appointment. Will be scheduled for indirect calorimetry to determine resting energy expenditure in a fasting state.  This will allow [place] to create a reduced calorie, high-protein meal plan to promote loss of fat mass while preserving muscle mass. Will think about ideas on how to incorporate physical activity into their daily routine. Will work on reducing intake of added sugars, simple sugars and processed carbs. Counseled on the health  benefits of losing 5%-15% of total body weight. Was counseled on nutritional approaches to weight loss and benefits of reducing processed foods and consuming plant-based foods and high quality protein as part of nutritional weight management. Was counseled on pharmacotherapy and role as an adjunct in weight management.   Obesity Education Performed Today:  She was weighed on the bioimpedance scale and results were discussed and documented in the synopsis.  We discussed obesity as a disease and the importance of a more detailed evaluation of all the factors contributing to the disease.  We discussed the importance of long term lifestyle changes which include nutrition, exercise and behavioral modifications as well as the importance of customizing this to her specific health and social needs.  We discussed the benefits of reaching a healthier weight to alleviate the symptoms of existing conditions and reduce the risks of the biomechanical, metabolic and psychological effects of obesity.  Caroline Ryan appears to be in the action stage of change and states they are ready to start intensive lifestyle modifications and behavioral modifications.  30 minutes was spent today on this visit including the above counseling, pre-visit chart review, and post-visit documentation.  Reviewed by clinician on day of visit: allergies, medications, problem list, medical history, surgical history,  family history, social history, and previous encounter notes pertinent to obesity diagnosis.    Seymour Bars, D.O. DABFM, DABOM [facility] [phone] [place], [place] [address] [phone]

## 2023-01-09 NOTE — Assessment & Plan Note (Signed)
BP is elevated today She reports good compliance taking Carvedilol 25 mg bid, Olmesartan 40 mg daily, Spironolactone 100 mg daily She denies CP or HA  Look for BP improvements with weight reduction reduce intake of high sodium foods Take all medication as directed

## 2023-01-09 NOTE — Assessment & Plan Note (Signed)
Bilat knee DJD has contributed to her weight gain and now she is working on BMI reduction to have bilateral TKRs with Dr Luiz Blare Her motivation is good and she is down 15 lb on her own She has added in water exercise 2-3 x a week  Continue water exercise with a goal of 3 x a week Begin active plan for weight reduction  with a target BMI <40 for TKRs

## 2023-01-09 NOTE — Assessment & Plan Note (Signed)
Lab Results  Component Value Date   HGBA1C 5.2 04/07/2020   A1c was most recently normal though she reports a hx of prediabetes She has never taken metformin  Plan to start a low sugar/ low starch diet and check A1c and fasting insulin with next labs

## 2023-01-10 ENCOUNTER — Ambulatory Visit: Payer: 59 | Admitting: Physical Therapy

## 2023-01-15 ENCOUNTER — Ambulatory Visit (HOSPITAL_COMMUNITY): Payer: 59

## 2023-01-15 ENCOUNTER — Other Ambulatory Visit: Payer: Self-pay

## 2023-01-17 ENCOUNTER — Ambulatory Visit: Payer: 59 | Admitting: Physical Therapy

## 2023-01-17 ENCOUNTER — Encounter: Payer: Self-pay | Admitting: Physical Therapy

## 2023-01-17 DIAGNOSIS — G8929 Other chronic pain: Secondary | ICD-10-CM

## 2023-01-17 DIAGNOSIS — M6281 Muscle weakness (generalized): Secondary | ICD-10-CM

## 2023-01-17 DIAGNOSIS — M25561 Pain in right knee: Secondary | ICD-10-CM | POA: Diagnosis not present

## 2023-01-17 DIAGNOSIS — R2689 Other abnormalities of gait and mobility: Secondary | ICD-10-CM | POA: Diagnosis not present

## 2023-01-17 DIAGNOSIS — M25562 Pain in left knee: Secondary | ICD-10-CM | POA: Diagnosis not present

## 2023-01-17 NOTE — Therapy (Signed)
OUTPATIENT PHYSICAL THERAPY TREATMENT   Patient Name: Caroline Ryan MRN: [id number] DOB:July 31, 1966, 57 y.o., female Today's Date: 01/17/2023   PCP: Warrick Parisian REFERRING PROVIDER: [name], MD   END OF SESSION:  PT End of Session - 01/17/23 1045     Visit Number 9    Number of Visits 17    Date for PT Re-Evaluation 02/12/23    Authorization Type [employer] / [employer] MCD    Authorization - Number of Visits 27    PT Start Time 1045    PT Stop Time 1126    PT Time Calculation (min) 41 min    Activity Tolerance Patient tolerated treatment well    Behavior During Therapy WFL for tasks assessed/performed                 Past Medical History:  Diagnosis Date   Anemia    Ascending aorta dilatation (HCC)    38mm by echo 07/2021 and 40mm by echo 07/2022   Asthma    Depression    no meds   Eczema    Fibroid    Headache(784.0)    otc prn med   History of blood transfusion    Hypertension    Knee pain, bilateral    arthralgia knee pain bilateral   Lower extremity edema    Mitral regurgitation    Mild to moderate by echo 07/2021   Seasonal allergies    SVD (spontaneous vaginal delivery)    x 3   Vaginal polyp 05/2020   Past Surgical History:  Procedure Laterality Date   ABDOMINAL HYSTERECTOMY     BILATERAL SALPINGECTOMY Bilateral 10/10/2017   Procedure: BILATERAL SALPINGECTOMY;  Surgeon: Hermina Staggers, MD;  Location: [facility];  Service: Gynecology;  Laterality: Bilateral;   BREAST BIOPSY     US guided core 2009   BREAST EXCISIONAL BIOPSY     right 1982 left 1981   BREAST SURGERY     bilateral cysts/benign   COLONOSCOPY  10/17/2021   VAGINAL HYSTERECTOMY N/A 10/10/2017   Procedure: HYSTERECTOMY VAGINAL;  Surgeon: Hermina Staggers, MD;  Location: [facility];  Service: Gynecology;  Laterality: N/A;   Patient Active Problem List   Diagnosis Date Noted   Prediabetes 01/09/2023   Severe major depression, single episode (HCC) 08/21/2022   Chronic  pain of both knees 05/16/2022   OSA (obstructive sleep apnea) 11/09/2021   Mitral regurgitation 09/07/2021   Dilated aortic root (HCC) 09/07/2021   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/2020   Unilateral primary osteoarthritis, left knee 10/22/2019   Unilateral primary osteoarthritis, right knee 10/22/2019   BMI 50.0-59.9, adult (HCC) 10/22/2019   Grieving 06/10/2019   Pruritus 06/10/2019   Lower extremity edema 11/02/2018   Post-operative state 10/10/2017   Symptomatic anemia 05/20/2017   Arthralgia of both knees 06/27/2015   BACK STRAIN, LUMBAR 03/16/2010   KNEE PAIN, BILATERAL 06/30/2009   Morbid obesity (HCC) with starting BMI 48 04/13/2009   Edema 04/13/2009   FIBROADENOMA, BREAST 04/20/2008   Primary hypertension 04/14/2008   Allergic rhinitis 04/14/2008   Asthma 04/14/2008    REFERRING DIAG: Bilateral primary osteoarthritis of knee  THERAPY DIAG:  Chronic pain of both knees  Muscle weakness (generalized)  Other abnormalities of gait and mobility  Chronic pain of left knee  Chronic pain of right knee  Rationale for Evaluation and Treatment: Rehabilitation  PRECAUTIONS: None  PERTINENT HISTORY: See PMH above    SUBJECTIVE:  SUBJECTIVE STATEMENT: Pt reports that she see's  some improvement in her knee pain  PAIN:  Are you having pain? Yes:  NPRS scale: 6/10 Pain location: Bilateral knee Pain description: Achy, burning, sore Aggravating factors: Bending down or getting on knees, walking, standing, stairs Relieving factors: Medication   OBJECTIVE:  PATIENT SURVEYS:  KOOS Jr.: 16/28  01/01/2023: 16/28  POSTURE:   Varus knee alignment  PALPATION: Tenderness to parapatellar region bilaterally  LOWER EXTREMITY ROM:  Active ROM Right eval Left eval  Hip flexion    Hip extension    Hip abduction    Hip adduction    Hip internal rotation    Hip external rotation    Knee flexion 102 110  Knee extension 0 0  Ankle dorsiflexion    Ankle plantarflexion     Ankle inversion    Ankle eversion     (Blank rows = not tested)  LOWER EXTREMITY MMT:  MMT Right eval Left eval Rt / Lt 01/01/2023  Hip flexion     Hip extension     Hip abduction     Hip adduction     Hip internal rotation     Hip external rotation     Knee flexion 5 5   Knee extension 4 4 4  / 4  Ankle dorsiflexion     Ankle plantarflexion     Ankle inversion     Ankle eversion      (Blank rows = not tested)  FUNCTIONAL TESTS:  5 times sit to stand: 22 seconds  12/04/2022: 9 seconds   6 minute walk test: 700 ft with 1 seated rest break for 30 sec at 3:20-2:50 12/26/22 : 6 minute walk test 827 feet with SPC and no rest breaks. 01/01/2023: 850 ft using SPC with SPC  GAIT: Distance walked: 700 ft Assistive device utilized: None Level of assistance: Complete Independence Comments: Trendelenburg, varus alignment   TODAY'S TREATMENT:     [facility] Adult PT Treatment:                                                DATE: 01/17/23 Therapeutic Exercise: Rec bike 5 min  STS 10 x 3 - 10# 6 inch step up x 10 each with 1 UE  SLR 15 x 2 each - 1# Bridge from ball - under calves - 2x10 3-5'' hold Retro TM - 1' x3  Manual Therapy - supine hooklying knee distraction mobilization with arm in popliteal space, rested on opposite leg and then moving into flexion  [facility] Adult PT Treatment:                                                DATE: 01/01/2023 Therapeutic Exercise: NuStep L5 x 6 min with UE/LE while taking subjective LAQ with 3# 2 x 15 each Seated hamstring curl with blue 2 x 10 each Sit to stand 2 x 10, holding 5# 2 x 10 Standing hip abduction 2 x 10 each Therapeutic Activity: 850 ft using [facility]   [facility] Adult PT Treatment:  DATE: 12/26/22 Therapeutic Exercise: Nustep 3 min, Rec bike 3 min  STS 10 x 3 6 inch step up x 10 each with 1 UE  SLR 10 x 1 each  Bridge 10 x 2  Therapeutic Activity: 6 minute walk test 827 feet    [facility] Adult PT Treatment:                                                DATE: 12/04/2022 Therapeutic Exercise: NuStep L5 x 6 min with UE/LE while taking subjective Quad set with ball under knee 2 x 10 each SLR 2 x 10 each Bridge 2 x 10 Supine clamshell with blue 2 x 10 Sidelying hip abduction 2 x 10 each LAQ with 2# 2 x 15 each Seated hamstring curl with blue 2 x 10 each Sit to stand 3 x 10 Standing heel raises 2 x 10  PATIENT EDUCATION:  Education details: POC extension, HEP Person educated: Patient Education method: Explanation, Demonstration, Tactile cues, Verbal cues Education comprehension: Verbalized understanding, returned demonstration, verbal cues required, tactile cues required, and needs further education  HOME EXERCISE PROGRAM: Access Code: [email or web address]    ASSESSMENT: CLINICAL IMPRESSION: [place] tolerated session well with no adverse reaction.  Trialed knee distraction mobilization to good short term effect.  Pt with high level of fatigue with slight reduction in pain following therex.  Showed pt how to do distraction mobilization at home.   OBJECTIVE IMPAIRMENTS: Abnormal gait, decreased activity tolerance, decreased balance, difficulty walking, decreased ROM, decreased strength, improper body mechanics, and pain.   ACTIVITY LIMITATIONS: bending, standing, squatting, stairs, and locomotion level  PARTICIPATION LIMITATIONS: meal prep, cleaning, shopping, community activity, and occupation  PERSONAL FACTORS: Fitness, Past/current experiences, and Time since onset of injury/illness/exacerbation are also affecting patient's functional outcome.    GOALS: Goals reviewed with patient? Yes  SHORT TERM GOALS: Target date: 12/08/2022  Patient will be I with initial HEP in order to progress with therapy. Baseline: HEP provided at eval 12/04/2022: independent with initial HEP Goal status: MET  2.  Patient will report knee pain </= 5/10 in order to reduce functional  limitations Baseline:  12/04/2022: 8/10  12/26/22: 5/10  Goal status: MET  3.  Patient will perform 5xSTS </= 15 seconds to indicate improvement in LE strength and reduction of fall risk Baseline: 22 seconds 12/04/2022: 9 seconds Goal status: MET  LONG TERM GOALS: Target date: 02/12/2023  Patient will be I with final HEP to maintain progress from PT. Baseline: HEP provided at eval 01/01/2023: progressing Goal status: ONGOING  2.  Patient will report KOOS Jr </= 2/28 in order to indicate reduced pain and improvement in knee function Baseline: 16/28 01/01/2023: 16/28 Goal status: ONGOING  3.  Patient will demonstrate bilateral knee extension strength 5/5 MMT in order to improve walking tolerance reduce feeling of knees giving out Baseline: 4/5 MMT 01/01/2023: 4/5 MMT Goal status: ONGOING  4.  Patient will ambulate >/= 1000 ft with in order to improve community access and ability to go shopping without limitation Baseline: 700 ft 12/26/22: 827 feet with SPC 01/01/2023: 850 ft using SPC Goal status: ONGOING   PLAN: PT FREQUENCY: 1-2x/week  PT DURATION: 6 weeks  PLANNED INTERVENTIONS: Therapeutic exercises, Therapeutic activity, Neuromuscular re-education, Balance training, Gait training, Patient/Family education, Self Care, Joint mobilization, Aquatic Therapy, Dry Needling, Electrical stimulation, Cryotherapy, Moist heat, Ionotophoresis  4mg /ml Dexamethasone, Manual therapy, and Re-evaluation  PLAN FOR NEXT SESSION: Review HEP and progress PRN, progress quad and hip strengthening, progress to closed chain exercises, balance training   Fredderick Phenix PT 01/17/23  11:25 AM Phone: [phone] Fax: [phone]

## 2023-01-18 ENCOUNTER — Other Ambulatory Visit: Payer: Self-pay | Admitting: Cardiology

## 2023-01-18 ENCOUNTER — Other Ambulatory Visit: Payer: Self-pay | Admitting: Nurse Practitioner

## 2023-01-18 ENCOUNTER — Other Ambulatory Visit: Payer: Self-pay

## 2023-01-18 MED ORDER — CARVEDILOL 25 MG PO TABS
25.0000 mg | ORAL_TABLET | Freq: Two times a day (BID) | ORAL | 3 refills | Status: DC
  Filled 2023-01-18: qty 60, 30d supply, fill #0
  Filled 2023-03-26: qty 60, 30d supply, fill #1
  Filled 2023-05-09: qty 60, 30d supply, fill #2
  Filled 2023-06-26: qty 60, 30d supply, fill #3
  Filled 2023-08-12: qty 60, 30d supply, fill #4
  Filled 2023-08-26: qty 60, 30d supply, fill #5
  Filled 2023-10-28: qty 60, 30d supply, fill #6
  Filled 2023-12-31 (×2): qty 60, 30d supply, fill #7

## 2023-01-18 MED ORDER — SPIRONOLACTONE 100 MG PO TABS
100.0000 mg | ORAL_TABLET | Freq: Every day | ORAL | 3 refills | Status: DC
  Filled 2023-01-18: qty 30, 30d supply, fill #0
  Filled 2023-03-26: qty 30, 30d supply, fill #1
  Filled 2023-06-26: qty 30, 30d supply, fill #2
  Filled 2023-08-12: qty 30, 30d supply, fill #3
  Filled 2023-08-26: qty 30, 30d supply, fill #4
  Filled 2023-10-28: qty 30, 30d supply, fill #5
  Filled 2023-12-31 (×2): qty 30, 30d supply, fill #6

## 2023-01-21 ENCOUNTER — Other Ambulatory Visit: Payer: Self-pay

## 2023-01-22 ENCOUNTER — Other Ambulatory Visit: Payer: Self-pay

## 2023-01-23 ENCOUNTER — Other Ambulatory Visit: Payer: Self-pay | Admitting: Nurse Practitioner

## 2023-01-23 ENCOUNTER — Other Ambulatory Visit: Payer: Self-pay

## 2023-01-23 DIAGNOSIS — L299 Pruritus, unspecified: Secondary | ICD-10-CM

## 2023-01-23 MED ORDER — HYDROXYZINE HCL 25 MG PO TABS
25.0000 mg | ORAL_TABLET | Freq: Three times a day (TID) | ORAL | 11 refills | Status: DC | PRN
Start: 2023-01-23 — End: 2024-03-06
  Filled 2023-01-23 – 2023-02-25 (×3): qty 30, 10d supply, fill #0
  Filled 2023-07-03: qty 30, 10d supply, fill #1
  Filled 2023-08-26: qty 30, 10d supply, fill #2

## 2023-01-23 MED ORDER — CICLOPIROX OLAMINE 0.77 % EX CREA
TOPICAL_CREAM | Freq: Two times a day (BID) | CUTANEOUS | 0 refills | Status: DC
  Filled 2023-01-23: qty 15, 20d supply, fill #0
  Filled 2023-02-25: qty 15, 7d supply, fill #0

## 2023-01-24 ENCOUNTER — Ambulatory Visit: Payer: 59 | Admitting: Physical Therapy

## 2023-01-25 ENCOUNTER — Encounter (HOSPITAL_COMMUNITY): Payer: 59 | Admitting: Psychiatry

## 2023-01-29 ENCOUNTER — Telehealth (HOSPITAL_COMMUNITY): Payer: Self-pay | Admitting: Mental Health

## 2023-01-29 NOTE — Telephone Encounter (Signed)
Therapist returned call to pt; with pt leaving voicemail of need to reschedule upcoming appointment with medication provider. Therapist returned call; no answer. Left voicemail to contact front desk for rescheduling needs and provided contact information.

## 2023-01-30 ENCOUNTER — Other Ambulatory Visit: Payer: Self-pay

## 2023-01-31 ENCOUNTER — Encounter (HOSPITAL_COMMUNITY): Payer: 59 | Admitting: Psychiatry

## 2023-01-31 ENCOUNTER — Ambulatory Visit: Payer: Self-pay | Admitting: Physical Therapy

## 2023-02-06 ENCOUNTER — Ambulatory Visit (INDEPENDENT_AMBULATORY_CARE_PROVIDER_SITE_OTHER): Payer: 59 | Admitting: Family Medicine

## 2023-02-20 ENCOUNTER — Ambulatory Visit (INDEPENDENT_AMBULATORY_CARE_PROVIDER_SITE_OTHER): Payer: 59 | Admitting: Family Medicine

## 2023-02-22 DIAGNOSIS — S92301A Fracture of unspecified metatarsal bone(s), right foot, initial encounter for closed fracture: Secondary | ICD-10-CM | POA: Diagnosis not present

## 2023-02-25 ENCOUNTER — Other Ambulatory Visit: Payer: Self-pay

## 2023-02-26 ENCOUNTER — Other Ambulatory Visit: Payer: Self-pay

## 2023-02-27 ENCOUNTER — Encounter (INDEPENDENT_AMBULATORY_CARE_PROVIDER_SITE_OTHER): Payer: Self-pay | Admitting: Family Medicine

## 2023-02-27 ENCOUNTER — Other Ambulatory Visit: Payer: Self-pay

## 2023-02-27 ENCOUNTER — Ambulatory Visit (INDEPENDENT_AMBULATORY_CARE_PROVIDER_SITE_OTHER): Payer: 59 | Admitting: Family Medicine

## 2023-02-27 VITALS — BP 116/78 | HR 82 | Temp 97.7°F | Ht 60.0 in | Wt 246.0 lb

## 2023-02-27 DIAGNOSIS — F321 Major depressive disorder, single episode, moderate: Secondary | ICD-10-CM | POA: Diagnosis not present

## 2023-02-27 DIAGNOSIS — R0602 Shortness of breath: Secondary | ICD-10-CM

## 2023-02-27 DIAGNOSIS — D649 Anemia, unspecified: Secondary | ICD-10-CM

## 2023-02-27 DIAGNOSIS — Z1331 Encounter for screening for depression: Secondary | ICD-10-CM | POA: Insufficient documentation

## 2023-02-27 DIAGNOSIS — R5383 Other fatigue: Secondary | ICD-10-CM

## 2023-02-27 DIAGNOSIS — Z6841 Body Mass Index (BMI) 40.0 and over, adult: Secondary | ICD-10-CM

## 2023-02-27 DIAGNOSIS — M170 Bilateral primary osteoarthritis of knee: Secondary | ICD-10-CM | POA: Diagnosis not present

## 2023-02-27 DIAGNOSIS — I10 Essential (primary) hypertension: Secondary | ICD-10-CM | POA: Diagnosis not present

## 2023-02-27 DIAGNOSIS — G4733 Obstructive sleep apnea (adult) (pediatric): Secondary | ICD-10-CM | POA: Diagnosis not present

## 2023-02-27 DIAGNOSIS — D638 Anemia in other chronic diseases classified elsewhere: Secondary | ICD-10-CM | POA: Insufficient documentation

## 2023-02-27 MED ORDER — ESCITALOPRAM OXALATE 10 MG PO TABS
10.0000 mg | ORAL_TABLET | Freq: Every day | ORAL | 0 refills | Status: DC
Start: 2023-02-27 — End: 2023-03-27
  Filled 2023-02-27 – 2023-03-26 (×2): qty 30, 30d supply, fill #0

## 2023-02-28 LAB — COMPREHENSIVE METABOLIC PANEL
ALT: 16 IU/L (ref 0–32)
AST: 11 IU/L (ref 0–40)
Albumin: 4.1 g/dL (ref 3.8–4.9)
Alkaline Phosphatase: 106 IU/L (ref 44–121)
BUN/Creatinine Ratio: 19 (ref 9–23)
BUN: 24 mg/dL (ref 6–24)
Bilirubin Total: 0.3 mg/dL (ref 0.0–1.2)
CO2: 21 mmol/L (ref 20–29)
Calcium: 10 mg/dL (ref 8.7–10.2)
Chloride: 106 mmol/L (ref 96–106)
Creatinine, Ser: 1.29 mg/dL — ABNORMAL HIGH (ref 0.57–1.00)
Globulin, Total: 2.7 g/dL (ref 1.5–4.5)
Glucose: 83 mg/dL (ref 70–99)
Potassium: 4.1 mmol/L (ref 3.5–5.2)
Sodium: 143 mmol/L (ref 134–144)
Total Protein: 6.8 g/dL (ref 6.0–8.5)
eGFR: 48 mL/min/{1.73_m2} — ABNORMAL LOW (ref >59)

## 2023-02-28 LAB — CBC WITH DIFFERENTIAL/PLATELET
Basophils Absolute: 0 10*3/uL (ref 0.0–0.2)
Basos: 1 %
EOS (ABSOLUTE): 0.1 10*3/uL (ref 0.0–0.4)
Eos: 3 %
Hematocrit: 27.4 % — ABNORMAL LOW (ref 34.0–46.6)
Hemoglobin: 8.7 g/dL — ABNORMAL LOW (ref 11.1–15.9)
Immature Grans (Abs): 0 10*3/uL (ref 0.0–0.1)
Immature Granulocytes: 0 %
Lymphocytes Absolute: 1.6 10*3/uL (ref 0.7–3.1)
Lymphs: 35 %
MCH: 31.4 pg (ref 26.6–33.0)
MCHC: 31.8 g/dL (ref 31.5–35.7)
MCV: 99 fL — ABNORMAL HIGH (ref 79–97)
Monocytes Absolute: 0.4 10*3/uL (ref 0.1–0.9)
Monocytes: 7 %
Neutrophils Absolute: 2.6 10*3/uL (ref 1.4–7.0)
Neutrophils: 54 %
Platelets: 137 10*3/uL — ABNORMAL LOW (ref 150–450)
RBC: 2.77 x10E6/uL — ABNORMAL LOW (ref 3.77–5.28)
RDW: 11.7 % (ref 11.7–15.4)
WBC: 4.7 10*3/uL (ref 3.4–10.8)

## 2023-02-28 LAB — VITAMIN D 25 HYDROXY (VIT D DEFICIENCY, FRACTURES): Vit D, 25-Hydroxy: 18.8 ng/mL — ABNORMAL LOW (ref 30.0–100.0)

## 2023-02-28 LAB — LIPID PANEL
Chol/HDL Ratio: 3.4 ratio (ref 0.0–4.4)
Cholesterol, Total: 186 mg/dL (ref 100–199)
HDL: 55 mg/dL (ref >39)
LDL Chol Calc (NIH): 116 mg/dL — ABNORMAL HIGH (ref 0–99)
Triglycerides: 79 mg/dL (ref 0–149)
VLDL Cholesterol Cal: 15 mg/dL (ref 5–40)

## 2023-02-28 LAB — HEMOGLOBIN A1C
Est. average glucose Bld gHb Est-mCnc: 94 mg/dL
Hgb A1c MFr Bld: 4.9 % (ref 4.8–5.6)

## 2023-02-28 LAB — T4, FREE: Free T4: 1.45 ng/dL (ref 0.82–1.77)

## 2023-02-28 LAB — IRON AND TIBC
Iron Saturation: 24 % (ref 15–55)
Iron: 63 ug/dL (ref 27–159)
Total Iron Binding Capacity: 259 ug/dL (ref 250–450)
UIBC: 196 ug/dL (ref 131–425)

## 2023-02-28 LAB — VITAMIN B12: Vitamin B-12: 383 pg/mL (ref 232–1245)

## 2023-02-28 LAB — TSH: TSH: 2.71 u[IU]/mL (ref 0.450–4.500)

## 2023-02-28 LAB — INSULIN, RANDOM: INSULIN: 9.4 u[IU]/mL (ref 2.6–24.9)

## 2023-02-28 LAB — FOLATE: Folate: 7.3 ng/mL (ref >3.0)

## 2023-02-28 LAB — FERRITIN: Ferritin: 167 ng/mL — ABNORMAL HIGH (ref 15–150)

## 2023-03-04 NOTE — Progress Notes (Signed)
Chief Complaint:   OBESITY Caroline Ryan (MR# [id number]) is a 57 y.o. female who presents for evaluation and treatment of obesity and related comorbidities. Current BMI is Body mass index is 48.04 kg/m. Caroline Ryan has been struggling with her weight for many years and has been unsuccessful in either losing weight, maintaining weight loss, or reaching her healthy weight goal.  Caroline Ryan is currently in the action stage of change and ready to dedicate time achieving and maintaining a healthier weight. Caroline Ryan is interested in becoming our patient and working on intensive lifestyle modifications including (but not limited to) diet and exercise for weight loss.  Patient works as a Lawyer; out of work now.  She lives with her grown son.  Has a right foot fracture.  No regular exercise, limited by knee pain.  Late-night snacker and stress eater.  Caroline Ryan's habits were reviewed today and are as follows: Her family eats meals together, she thinks her family will eat healthier with her, her desired weight loss is 86 lbs, she started gaining weight in 2022, her heaviest weight ever was 265 pounds, she skips meals frequently, she is frequently drinking liquids with calories, she frequently makes poor food choices, and she struggles with emotional eating.  Depression Screen Caroline Ryan's Food and Mood (modified PHQ-9) score was 6.  Subjective:   1. Other fatigue Caroline Ryan admits to daytime somnolence and denies waking up still tired. Patient has a history of symptoms of daytime fatigue. Caroline Ryan generally gets 7 hours of sleep per night, and states that she has generally restful sleep. Snoring is present. Apneic episodes are present. Epworth Sleepiness Score is 8.  EKG done on 10/24/2022.  2. SOBOE (shortness of breath on exertion) Caroline Ryan notes increasing shortness of breath with exercising and seems to be worsening over time with weight gain. She notes getting out of breath sooner with activity than she used to. This has  not gotten worse recently. Caroline Ryan denies shortness of breath at rest or orthopnea.  3. OSA on CPAP Patient's Epworth score is 8.  She gets 7 hours of sleep at night, using nasal pillows.  4. MDD (major depressive disorder), single episode, moderate (HCC) Patient's bariatric PHQ-9 score is 6.  She is on Lexapro 10 mg once daily, and she is out of her prescription.  5. Essential hypertension Patient's blood pressure is well-controlled on carvedilol 25 mg twice daily, olmesartan 40 mg once daily, spironolactone 100 mg once daily, and Lasix 20 mg every other day.  She reports good compliance with her medication use.  6. Anemia, unspecified type Patient hemoglobin was 9.5 on 12/23/2022, MCV high, MCHC low.  Patient has a history of DU B, and is status post hysterectomy 2 years ago.  Last colonoscopy was done in February 2023.  Has needed blood transfusions.  7. Osteoarthritis of both knees, unspecified osteoarthritis type Patient has bilateral knee pain that limits her ability to exercise.  Right> left knee pain, plans to have total knee replacement.  Rarely takes ibuprofen 800 mg 3 times daily as needed and Tylenol PM.  Assessment/Plan:   1. Other fatigue Caroline Ryan does feel that her weight is causing her energy to be lower than it should be. Fatigue may be related to obesity, depression or many other causes. Labs will be ordered, and in the meanwhile, Caroline Ryan will focus on self care including making healthy food choices, increasing physical activity and focusing on stress reduction.  - VITAMIN D 25 Hydroxy (Vit-D Deficiency, Fractures) - TSH -  T4, free - Lipid panel - Insulin, random - Hemoglobin A1c - Folate - Comprehensive metabolic panel - Vitamin B12 - CBC with Differential/Platelet  2. SOBOE (shortness of breath on exertion) Caroline Ryan does feel that she gets out of breath more easily that she used to when she exercises. Caroline Ryan's shortness of breath appears to be obesity related and exercise  induced. She has agreed to work on weight loss and gradually increase exercise to treat her exercise induced shortness of breath. Will continue to monitor closely.  3. OSA on CPAP Patient is to aim for 7 to 8 hours of sleep at night with CPAP.  4. MDD (major depressive disorder), single episode, moderate (HCC) We will refill Lexapro 10 mg once daily for 1 month.  - escitalopram (LEXAPRO) 10 MG tablet; Take 1 tablet (10 mg total) by mouth daily.  Dispense: 30 tablet; Refill: 0  5. Essential hypertension Look for blood pressure improvements with weight loss.  6. Anemia, unspecified type We will check labs today, we will follow-up at her next visit.  - Folate - Vitamin B12 - CBC with Differential/Platelet - Ferritin - Iron and TIBC  7. Osteoarthritis of both knees, unspecified osteoarthritis type Patient will begin BMI reduction for future total knee replacement.  Look into water therapy.  8. Depression screen Caroline Ryan had a positive depression screening. Depression is commonly associated with obesity and often results in emotional eating behaviors. We will monitor this closely and work on CBT to help improve the non-hunger eating patterns. Referral to Psychology may be required if no improvement is seen as she continues in our clinic.  9. BMI 45.0-49.9, adult (HCC)  10. Morbid obesity with starting BMI 48.0 Caroline Ryan is currently in the action stage of change and her goal is to continue with weight loss efforts. I recommend Caroline Ryan begin the structured treatment plan as follows:  She has agreed to the Category 1 Plan.  Exercise goals: As is.    Behavioral modification strategies: increasing vegetables, increasing water intake, decreasing liquid calories, decreasing eating out, no skipping meals, meal planning and cooking strategies, keeping healthy foods in the home, better snacking choices, and planning for success.  She was informed of the importance of frequent follow-up visits to  maximize her success with intensive lifestyle modifications for her multiple health conditions. She was informed we would discuss her lab results at her next visit unless there is a critical issue that needs to be addressed sooner. Caroline Ryan agreed to keep her next visit at the agreed upon time to discuss these results.  Objective:   Blood pressure 116/78, pulse 82, temperature 97.7 F (36.5 C), height 5' (1.524 m), weight 246 lb (111.6 kg), last menstrual period 04/03/2017, SpO2 99%. Body mass index is 48.04 kg/m.  EKG: Normal sinus rhythm, rate (Unable to obtain).  Indirect Calorimeter completed today shows a VO2 of 219 and a REE of 1512.  Her calculated basal metabolic rate is 9528 thus her basal metabolic rate is worse than expected.  General: Cooperative, alert, well developed, in no acute distress. HEENT: Conjunctivae and lids unremarkable. Cardiovascular: Regular rhythm.  Lungs: Normal work of breathing. Neurologic: No focal deficits.   Lab Results  Component Value Date   CREATININE 1.29 (H) 02/27/2023   BUN 24 02/27/2023   NA 143 02/27/2023   K 4.1 02/27/2023   CL 106 02/27/2023   CO2 21 02/27/2023   Lab Results  Component Value Date   ALT 16 02/27/2023   AST 11 02/27/2023  ALKPHOS 106 02/27/2023   BILITOT 0.3 02/27/2023   Lab Results  Component Value Date   HGBA1C 4.9 02/27/2023   HGBA1C 5.2 04/07/2020   HGBA1C 5.0 06/10/2019   HGBA1C 5.1 12/24/2018   HGBA1C 5.4 10/31/2018   Lab Results  Component Value Date   INSULIN 9.4 02/27/2023   Lab Results  Component Value Date   TSH 2.710 02/27/2023   Lab Results  Component Value Date   CHOL 186 02/27/2023   HDL 55 02/27/2023   LDLCALC 116 (H) 02/27/2023   TRIG 79 02/27/2023   CHOLHDL 3.4 02/27/2023   Lab Results  Component Value Date   WBC 4.7 02/27/2023   HGB 8.7 (L) 02/27/2023   HCT 27.4 (L) 02/27/2023   MCV 99 (H) 02/27/2023   PLT 137 (L) 02/27/2023   Lab Results  Component Value Date   IRON 63  02/27/2023   TIBC 259 02/27/2023   FERRITIN 167 (H) 02/27/2023   Attestation Statements:   Reviewed by clinician on day of visit: allergies, medications, problem list, medical history, surgical history, family history, social history, and previous encounter notes.  Time spent on visit including pre-visit chart review and post-visit charting and care was 45 minutes.   Trude Mcburney, am acting as transcriptionist for Seymour Bars, DO.  I have reviewed the above documentation for accuracy and completeness, and I agree with the above. Seymour Bars, DO

## 2023-03-05 ENCOUNTER — Other Ambulatory Visit: Payer: Self-pay

## 2023-03-07 ENCOUNTER — Other Ambulatory Visit: Payer: Self-pay | Admitting: Nurse Practitioner

## 2023-03-07 ENCOUNTER — Ambulatory Visit: Payer: Self-pay | Admitting: Nurse Practitioner

## 2023-03-07 DIAGNOSIS — Z0000 Encounter for general adult medical examination without abnormal findings: Secondary | ICD-10-CM

## 2023-03-13 ENCOUNTER — Ambulatory Visit (INDEPENDENT_AMBULATORY_CARE_PROVIDER_SITE_OTHER): Payer: 59 | Admitting: Family Medicine

## 2023-03-13 DIAGNOSIS — I890 Lymphedema, not elsewhere classified: Secondary | ICD-10-CM | POA: Diagnosis not present
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
## 2023-03-18 DIAGNOSIS — S92301A Fracture of unspecified metatarsal bone(s), right foot, initial encounter for closed fracture: Secondary | ICD-10-CM | POA: Diagnosis not present

## 2023-03-19 ENCOUNTER — Ambulatory Visit (INDEPENDENT_AMBULATORY_CARE_PROVIDER_SITE_OTHER): Payer: 59 | Admitting: Family Medicine

## 2023-03-26 ENCOUNTER — Other Ambulatory Visit: Payer: Self-pay

## 2023-03-27 ENCOUNTER — Other Ambulatory Visit: Payer: Self-pay

## 2023-03-27 ENCOUNTER — Ambulatory Visit (INDEPENDENT_AMBULATORY_CARE_PROVIDER_SITE_OTHER): Payer: 59 | Admitting: Psychiatry

## 2023-03-27 ENCOUNTER — Encounter (HOSPITAL_COMMUNITY): Payer: Self-pay | Admitting: Psychiatry

## 2023-03-27 VITALS — HR 75 | Ht 60.0 in | Wt 244.0 lb

## 2023-03-27 DIAGNOSIS — F331 Major depressive disorder, recurrent, moderate: Secondary | ICD-10-CM

## 2023-03-27 DIAGNOSIS — F330 Major depressive disorder, recurrent, mild: Secondary | ICD-10-CM | POA: Insufficient documentation

## 2023-03-27 DIAGNOSIS — F419 Anxiety disorder, unspecified: Secondary | ICD-10-CM | POA: Insufficient documentation

## 2023-03-27 DIAGNOSIS — F439 Reaction to severe stress, unspecified: Secondary | ICD-10-CM

## 2023-03-27 MED ORDER — BUPROPION HCL ER (XL) 150 MG PO TB24: 150.0000 mg | ORAL_TABLET | Freq: Every day | ORAL | 3 refills | Status: DC

## 2023-03-27 MED ORDER — BUPROPION HCL ER (XL) 150 MG PO TB24
150.0000 mg | ORAL_TABLET | Freq: Every day | ORAL | 3 refills | Status: DC
  Filled 2023-03-27: qty 30, 30d supply, fill #0

## 2023-03-27 NOTE — Patient Instructions (Addendum)
Thank you for attending your appointment today. -- stop taking escitalopram (Lexapro) 10 mg starting today (03/27/2023) -- start taking bupropion (Wellbutrin) ER 150 mg tomorrow (03/28/2023)  Please do not make any changes to medications without first discussing with your provider. If you are experiencing a psychiatric emergency, please call 911 or present to your nearest emergency department. Additional crisis, medication management, and therapy resources are included below.  [facility]  [address], [place], [place] [address] [phone] WALK-IN URGENT CARE 24/7 FOR ANYONE [address], [place], [place]  [phone] Fax: [phone] [email or web address] *Interpreters available *Accepts all insurance and uninsured for Urgent Care needs *Accepts Medicaid and uninsured for outpatient treatment (below)      ONLY FOR [facility]  Below:    Outpatient New Patient Assessment/Therapy Walk-ins:        Monday -Thursday 8am until slots are full.        Every Friday 1pm-4pm  (first come, first served)                   New Patient Psychiatry/Medication Management        Monday-Friday 8am-11am (first come, first served)               For all walk-ins we ask that you arrive by 7:15am, because patients will be seen in the order of arrival.

## 2023-03-28 ENCOUNTER — Other Ambulatory Visit: Payer: Self-pay

## 2023-03-29 NOTE — Addendum Note (Signed)
Addended by: Theodoro Kos A on: 03/29/2023 02:37 PM   Modules accepted: Level of Service

## 2023-04-01 ENCOUNTER — Other Ambulatory Visit: Payer: Self-pay

## 2023-04-03 ENCOUNTER — Ambulatory Visit: Payer: Self-pay | Admitting: Nurse Practitioner

## 2023-04-03 ENCOUNTER — Ambulatory Visit (INDEPENDENT_AMBULATORY_CARE_PROVIDER_SITE_OTHER): Payer: 59 | Admitting: Family Medicine

## 2023-04-04 ENCOUNTER — Encounter: Payer: Self-pay | Admitting: Nurse Practitioner

## 2023-04-04 ENCOUNTER — Ambulatory Visit (INDEPENDENT_AMBULATORY_CARE_PROVIDER_SITE_OTHER): Payer: 59 | Admitting: Nurse Practitioner

## 2023-04-04 ENCOUNTER — Other Ambulatory Visit: Payer: Self-pay

## 2023-04-04 VITALS — BP 76/54 | HR 97 | Temp 97.1°F | Wt 240.6 lb

## 2023-04-04 DIAGNOSIS — M25562 Pain in left knee: Secondary | ICD-10-CM | POA: Diagnosis not present

## 2023-04-04 DIAGNOSIS — G8929 Other chronic pain: Secondary | ICD-10-CM

## 2023-04-04 DIAGNOSIS — M25561 Pain in right knee: Secondary | ICD-10-CM | POA: Diagnosis not present

## 2023-04-04 MED ORDER — OXYCODONE-ACETAMINOPHEN 5-325 MG PO TABS
1.0000 | ORAL_TABLET | Freq: Three times a day (TID) | ORAL | 0 refills | Status: AC | PRN
Start: 2023-04-04 — End: 2023-04-15
  Filled 2023-04-04: qty 15, 5d supply, fill #0

## 2023-04-04 MED ORDER — KETOROLAC TROMETHAMINE 60 MG/2ML IM SOLN
60.0000 mg | Freq: Once | INTRAMUSCULAR | Status: AC
Start: 2023-04-04 — End: 2023-04-04
  Administered 2023-04-04: 60 mg via INTRAMUSCULAR

## 2023-04-04 MED ORDER — DICLOFENAC SODIUM 75 MG PO TBEC
75.0000 mg | DELAYED_RELEASE_TABLET | Freq: Two times a day (BID) | ORAL | 0 refills | Status: DC
Start: 2023-04-04 — End: 2023-05-09
  Filled 2023-04-04: qty 30, 15d supply, fill #0

## 2023-04-04 NOTE — Progress Notes (Signed)
@Patient  ID: Caroline Ryan, female    DOB: [birth year], 57 y.o.   MRN: [id number]  Chief Complaint  Patient presents with   Knee Pain    Referring provider: Ivonne Andrew, NP   HPI  Patient presents today for an acute visit.  She states that she does have chronic bilateral knee pain.  She has been seen by orthopedics and is awaiting knee replacement but first has to lose more weight.  She states that her knees have flared up and been hurting more over the past few days.  Patient is requesting a referral to pain clinic.  She would like some pain medicine to use sparingly. Denies f/c/s, n/v/d, hemoptysis, PND, leg swelling Denies chest pain or edema        No Known Allergies  Immunization History  Administered Date(s) Administered   COVID-19, mRNA, vaccine(Comirnaty)12 years and older 09/20/2022   Influenza Whole 06/30/2009   Influenza,inj,Quad PF,6+ Mos 06/27/2015, 07/04/2016, 06/09/2018, 06/10/2019   PFIZER(Purple Top)SARS-COV-2 Vaccination 12/25/2019, 01/15/2020   Pneumococcal Polysaccharide-23 04/14/2008, 06/27/2015   Td 10/18/2005   Tdap 07/04/2016    Past Medical History:  Diagnosis Date   Anemia    Ascending aorta dilatation (HCC)    38mm by echo 07/2021 and 40mm by echo 07/2022   Asthma    Depression    no meds   Depression    Eczema    Fibroid    Headache(784.0)    otc prn med   History of blood transfusion    Hypertension    Knee pain, bilateral    arthralgia knee pain bilateral   Lower extremity edema    Mitral regurgitation    Mild to moderate by echo 07/2021   Seasonal allergies    Sleep apnea    SVD (spontaneous vaginal delivery)    x 3   Vaginal polyp 05/2020    Tobacco History: Social History   Tobacco Use  Smoking Status Former   Current packs/day: 0.00   Average packs/day: 0.3 packs/day for 2.0 years (0.5 ttl pk-yrs)   Types: Cigarettes   Start date: 08/20/2012   Quit date: 08/20/2014   Years since quitting: 8.6   Smokeless Tobacco Never   Counseling given: Not Answered   Outpatient Encounter Medications as of 04/04/2023  Medication Sig   acetaminophen (TYLENOL) 325 MG tablet Take 650 mg by mouth daily as needed for moderate pain, headache or fever.   albuterol (VENTOLIN HFA) 108 (90 Base) MCG/ACT inhaler Inhale 1 puff every 6 hours as needed for shortness of breath   aspirin 81 MG chewable tablet Chew by mouth daily.   buPROPion (WELLBUTRIN XL) 150 MG 24 hr tablet Take 1 tablet (150 mg total) by mouth daily.   carvedilol (COREG) 25 MG tablet Take 1 tablet (25 mg total) by mouth 2 (two) times daily.   cetirizine (ZYRTEC) 10 MG tablet Take 1 tablet (10 mg total) by mouth daily.   ciclopirox (LOPROX) 0.77 % cream Apply topically 2 (two) times daily.   cyclobenzaprine (FLEXERIL) 5 MG tablet Take 1 tablet (5 mg total) by mouth 3 (three) times daily as needed for muscle spasms.   diclofenac (VOLTAREN) 75 MG EC tablet Take 1 tablet (75 mg total) by mouth 2 (two) times daily.   fluticasone (FLONASE) 50 MCG/ACT nasal spray Place 1 spray into both nostrils daily. (Patient taking differently: Place 1 spray into both nostrils daily as needed for allergies.)   furosemide (LASIX) 20 MG tablet Take 1 tablet (20  mg total) by mouth daily.   hydrocortisone 2.5 % cream Apply topically 2 (two) times daily. (Patient taking differently: Apply 1 Application topically daily as needed (itching).)   hydrOXYzine (ATARAX) 25 MG tablet Take 1 tablet (25 mg total) by mouth 3 (three) times daily as needed.   ipratropium-albuterol (DUONEB) 0.5-2.5 (3) MG/3ML SOLN Take 3 mLs by nebulization every 6 (six) hours as needed.   linaclotide (LINZESS) 72 MCG capsule Take 1 capsule (72 mcg total) by mouth daily before breakfast.   methocarbamol (ROBAXIN-750) 750 MG tablet Take 1 tablet (750 mg total) by mouth 4 (four) times daily. (Patient taking differently: Take 750 mg by mouth every 6 (six) hours as needed for muscle spasms.)    nitroGLYCERIN (NITROSTAT) 0.4 MG SL tablet Place 1 tablet (0.4 mg total) under the tongue every 5 (five) minutes as needed for chest pain (max 3 doses, if no relief call 911).   olmesartan (BENICAR) 40 MG tablet Take 1 tablet (40 mg total) by mouth daily.   oxyCODONE-acetaminophen (PERCOCET/ROXICET) 5-325 MG tablet Take 1 tablet by mouth every 8 (eight) hours as needed for up to 5 days for severe pain.   Respiratory Therapy Supplies (NEBULIZER MASK ADULT) MISC Use this mask with your nebulizer.   spironolactone (ALDACTONE) 100 MG tablet Take 1 tablet (100 mg total) by mouth daily.   triamcinolone cream (KENALOG) 0.5 % Apply 1 Application topically 3 (three) times daily.   azithromycin (ZITHROMAX) 250 MG tablet Take 1 tablet (250 mg total) by mouth daily. Take first 2 tablets together, then 1 every day until finished. (Patient not taking: Reported on 03/27/2023)   benzonatate (TESSALON) 100 MG capsule Take 1 capsule (100 mg total) by mouth every 8 (eight) hours. (Patient not taking: Reported on 04/04/2023)   diclofenac Sodium (VOLTAREN) 1 % GEL Apply 4 g topically 4 (four) times daily. (Patient not taking: Reported on 03/27/2023)   docusate sodium (COLACE) 100 MG capsule Take 1 capsule (100 mg total) by mouth 2 (two) times daily as needed. (Patient not taking: Reported on 03/27/2023)   ibuprofen (ADVIL) 800 MG tablet Take 1 tablet (800 mg total) by mouth 3 (three) times daily. (Patient not taking: Reported on 03/27/2023)   NONFORMULARY OR COMPOUNDED ITEM Diclofenac 3%, Gabapentin 5%, Lidocaine 5%, Menthol 1%.  Apply 1-2 pumps three to four times per day as needed (Patient not taking: Reported on 04/04/2023)   [DISCONTINUED] Ciclopirox-Salicylic Acid 0.77-2 % SHAM Apply 1 Application topically 2 (two) times daily. (Patient not taking: Reported on 04/04/2023)   Facility-Administered Encounter Medications as of 04/04/2023  Medication   [COMPLETED] ketorolac (TORADOL) injection 60 mg   Triamcinolone Acetonide  (ZILRETTA) intra-articular injection 32 mg   Triamcinolone Acetonide (ZILRETTA) intra-articular injection 32 mg     Review of Systems  Review of Systems  Constitutional: Negative.   HENT: Negative.    Cardiovascular: Negative.   Gastrointestinal: Negative.   Musculoskeletal:        Bilateral knee pain  Allergic/Immunologic: Negative.   Neurological: Negative.   Psychiatric/Behavioral: Negative.         Physical Exam  BP (!) 76/54   Pulse 97   Temp (!) 97.1 F (36.2 C)   Wt 240 lb 9.6 oz (109.1 kg)   LMP 04/03/2017 (Approximate)   SpO2 100%   BMI 46.99 kg/m   Wt Readings from Last 5 Encounters:  04/04/23 240 lb 9.6 oz (109.1 kg)  02/27/23 246 lb (111.6 kg)  01/09/23 247 lb (112 kg)  12/14/22 249  lb (112.9 kg)  11/12/22 249 lb (112.9 kg)     Physical Exam Vitals and nursing note reviewed.  Constitutional:      General: She is not in acute distress.    Appearance: She is well-developed.  Cardiovascular:     Rate and Rhythm: Normal rate and regular rhythm.  Pulmonary:     Effort: Pulmonary effort is normal.     Breath sounds: Normal breath sounds.  Musculoskeletal:     Right knee: Decreased range of motion. Tenderness present.     Left knee: Decreased range of motion. Tenderness present.  Neurological:     Mental Status: She is alert and oriented to person, place, and time.      Lab Results:  CBC    Component Value Date/Time   WBC 4.7 02/27/2023 0924   WBC 10.9 (H) 10/23/2022 1119   RBC 2.77 (L) 02/27/2023 0924   RBC 3.04 (L) 10/23/2022 1119   HGB 8.7 (L) 02/27/2023 0924   HCT 27.4 (L) 02/27/2023 0924   PLT 137 (L) 02/27/2023 0924   MCV 99 (H) 02/27/2023 0924   MCH 31.4 02/27/2023 0924   MCH 31.3 10/23/2022 1119   MCHC 31.8 02/27/2023 0924   MCHC 29.5 (L) 10/23/2022 1119   RDW 11.7 02/27/2023 0924   LYMPHSABS 1.6 02/27/2023 0924   MONOABS 558 03/14/2017 1515   EOSABS 0.1 02/27/2023 0924   BASOSABS 0.0 02/27/2023 0924    BMET     Component Value Date/Time   NA 143 02/27/2023 0924   K 4.1 02/27/2023 0924   CL 106 02/27/2023 0924   CO2 21 02/27/2023 0924   GLUCOSE 83 02/27/2023 0924   GLUCOSE 150 (H) 10/23/2022 1311   BUN 24 02/27/2023 0924   CREATININE 1.29 (H) 02/27/2023 0924   CREATININE 0.75 06/20/2017 1201   CALCIUM 10.0 02/27/2023 0924   GFRNONAA >60 10/23/2022 1311   GFRNONAA 91 06/20/2017 1201   GFRAA 98 02/16/2020 1114   GFRAA 105 06/20/2017 1201    BNP    Component Value Date/Time   BNP 95.5 10/23/2022 1119      Assessment & Plan:   Chronic pain of both knees - ketorolac (TORADOL) injection 60 mg - diclofenac (VOLTAREN) 75 MG EC tablet; Take 1 tablet (75 mg total) by mouth 2 (two) times daily.  Dispense: 30 tablet; Refill: 0 - Ambulatory referral to Pain Clinic - oxyCODONE-acetaminophen (PERCOCET/ROXICET) 5-325 MG tablet; Take 1 tablet by mouth every 8 (eight) hours as needed for up to 5 days for severe pain.  Dispense: 15 tablet; Refill: 0    Follow up:  Follow up as scheduled     Ivonne Andrew, NP 04/04/2023

## 2023-04-04 NOTE — Assessment & Plan Note (Signed)
-   ketorolac (TORADOL) injection 60 mg - diclofenac (VOLTAREN) 75 MG EC tablet; Take 1 tablet (75 mg total) by mouth 2 (two) times daily.  Dispense: 30 tablet; Refill: 0 - Ambulatory referral to Pain Clinic - oxyCODONE-acetaminophen (PERCOCET/ROXICET) 5-325 MG tablet; Take 1 tablet by mouth every 8 (eight) hours as needed for up to 5 days for severe pain.  Dispense: 15 tablet; Refill: 0    Follow up:  Follow up as scheduled

## 2023-04-04 NOTE — Patient Instructions (Addendum)
1. Chronic pain of both knees  - ketorolac (TORADOL) injection 60 mg - diclofenac (VOLTAREN) 75 MG EC tablet; Take 1 tablet (75 mg total) by mouth 2 (two) times daily.  Dispense: 30 tablet; Refill: 0 - Ambulatory referral to Pain Clinic - oxyCODONE-acetaminophen (PERCOCET/ROXICET) 5-325 MG tablet; Take 1 tablet by mouth every 8 (eight) hours as needed for up to 5 days for severe pain.  Dispense: 15 tablet; Refill: 0    Follow up:  Follow up as scheduled

## 2023-04-08 ENCOUNTER — Other Ambulatory Visit: Payer: Self-pay

## 2023-04-08 ENCOUNTER — Ambulatory Visit: Payer: Self-pay | Admitting: Nurse Practitioner

## 2023-04-10 ENCOUNTER — Other Ambulatory Visit: Payer: Self-pay

## 2023-04-13 DIAGNOSIS — I890 Lymphedema, not elsewhere classified: Secondary | ICD-10-CM | POA: Diagnosis not present

## 2023-04-16 ENCOUNTER — Ambulatory Visit (HOSPITAL_COMMUNITY): Payer: 59 | Admitting: Mental Health

## 2023-04-18 ENCOUNTER — Encounter: Payer: Self-pay | Admitting: Family Medicine

## 2023-04-18 ENCOUNTER — Ambulatory Visit (INDEPENDENT_AMBULATORY_CARE_PROVIDER_SITE_OTHER): Payer: 59 | Admitting: Family Medicine

## 2023-04-18 ENCOUNTER — Other Ambulatory Visit: Payer: Self-pay

## 2023-04-18 VITALS — BP 127/86 | HR 77 | Temp 98.1°F | Ht 60.0 in | Wt 240.0 lb

## 2023-04-18 DIAGNOSIS — E559 Vitamin D deficiency, unspecified: Secondary | ICD-10-CM | POA: Diagnosis not present

## 2023-04-18 DIAGNOSIS — D508 Other iron deficiency anemias: Secondary | ICD-10-CM

## 2023-04-18 DIAGNOSIS — N1831 Chronic kidney disease, stage 3a: Secondary | ICD-10-CM | POA: Diagnosis not present

## 2023-04-18 DIAGNOSIS — Z6841 Body Mass Index (BMI) 40.0 and over, adult: Secondary | ICD-10-CM

## 2023-04-18 DIAGNOSIS — M170 Bilateral primary osteoarthritis of knee: Secondary | ICD-10-CM

## 2023-04-18 DIAGNOSIS — D509 Iron deficiency anemia, unspecified: Secondary | ICD-10-CM | POA: Insufficient documentation

## 2023-04-18 MED ORDER — VITAMIN D (ERGOCALCIFEROL) 1.25 MG (50000 UNIT) PO CAPS
50000.0000 [IU] | ORAL_CAPSULE | ORAL | 0 refills | Status: DC
Start: 2023-04-18 — End: 2023-04-26
  Filled 2023-04-18: qty 4, 28d supply, fill #0

## 2023-04-18 MED ORDER — FERROUS GLUCONATE 324 (38 FE) MG PO TABS
324.0000 mg | ORAL_TABLET | Freq: Two times a day (BID) | ORAL | 0 refills | Status: DC
Start: 2023-04-18 — End: 2023-10-07
  Filled 2023-04-18: qty 60, 30d supply, fill #0

## 2023-04-18 NOTE — Progress Notes (Signed)
Office: [phone]  /  Fax: [phone]  WEIGHT SUMMARY AND BIOMETRICS  Starting Date: 02/27/23  Starting Weight: 246lb   Weight Lost Since Last Visit: 6lb   Vitals Temp: 98.1 F (36.7 C) BP: 127/86 Pulse Rate: 77 SpO2: 99 %   Body Composition  Body Fat %: 54.7 % Fat Mass (lbs): 131.4 lbs Muscle Mass (lbs): 103.4 lbs Visceral Fat Rating : 20   HPI  Chief Complaint: OBESITY  Caroline Ryan is here to discuss her progress with her obesity treatment plan. She is on the the Category 1 Plan and states she is following her eating plan approximately 75 % of the time. She states she is walking for  10-15 minutes 3 times per week.   Interval History:  Since last office visit she is down 6 lb She was able to buy some of the food on her meal plan She has cut back on sweets and late night sweets She is trying to lose weight to have knee replacement surgery She is eating more veggies She is depending on her son for grocery shopping She is drinking water.  Her son buys juice Exercise has been limited due to knee arthritis  Pharmacotherapy: none  PHYSICAL EXAM:  Blood pressure 127/86, pulse 77, temperature 98.1 F (36.7 C), height 5' (1.524 m), weight 240 lb (108.9 kg), last menstrual period 04/03/2017, SpO2 99%. Body mass index is 46.87 kg/m.  General: She is overweight, cooperative, alert, well developed, and in no acute distress. PSYCH: Has normal mood, affect and thought process.   Lungs: Normal breathing effort, no conversational dyspnea. Using cane to ambulate  ASSESSMENT AND PLAN  TREATMENT PLAN FOR OBESITY:  Recommended Dietary Goals  Caroline Ryan is currently in the action stage of change. As such, her goal is to continue weight management plan. She has agreed to the Category 1 Plan.  Behavioral Intervention  We discussed the following Behavioral Modification Strategies today: increasing lean protein intake, decreasing simple carbohydrates , increasing vegetables,  increasing lower glycemic fruits, increasing water intake, work on meal planning and preparation, keeping healthy foods at home, identifying sources and decreasing liquid calories, continue to practice mindfulness when eating, and planning for success. -She may add in cost friendly options for protein and fiber such as sweet potatoes, beans, Greek yogurt, tuna -Allow 2 fruit servings per day -Recommended zero sugar juices to replace full sugar options  Additional resources provided today: NA  Recommended Physical Activity Goals  Caroline Ryan has been advised to work up to 150 minutes of moderate intensity aerobic activity a week and strengthening exercises 2-3 times per week for cardiovascular health, weight loss maintenance and preservation of muscle mass.   She has agreed to Think about ways to increase daily physical activity and overcoming barriers to exercise -Short walks as tolerated  Pharmacotherapy changes for the treatment of obesity: None  ASSOCIATED CONDITIONS ADDRESSED TODAY  Vitamin D deficiency Assessment & Plan: Last vitamin D Lab Results  Component Value Date   VD25OH 18.8 (L) 02/27/2023   Reviewed lab from last visit.  Explained problems with vitamin D deficiency including fatigue, poor bone health and poor immune function.  She does have complaints of fatigue.  She is currently not taking a vitamin D supplement.  Begin vitamin D 50,000 IU once weekly.  Recheck level in 3 to 4 months.  Orders: -     Vitamin D (Ergocalciferol); Take 1 capsule (50,000 Units total) by mouth every 7 (seven) days.  Dispense: 12 capsule; Refill: 0  Morbid obesity with starting BMI 48.0  BMI 45.0-49.9, adult (HCC)  Stage 3a chronic kidney disease (HCC) Assessment & Plan: New.  Reviewed most recent labs with patient.  Her creatinine was 1.29 with a GFR of 48.  In review of her medication list, she has been taking diclofenac 75 mg twice daily for bilateral DJD pain in her knees, prescribed by  her PCP.  She is awaiting bilateral knee arthroplasty once her BMI is under 40.  Will CC her PCP for further management.     Osteoarthritis of both knees, unspecified osteoarthritis type Assessment & Plan: Currently using a cane to ambulate.  She has limited ability to walk for exercise due to knee pain.  She has been taking diclofenac 75 mg twice daily and has a written prescription for narcotics.  She is actively working on BMI reduction in order to have bilateral knee arthroplasty done.  She is managed by orthopedics.  Continue active plan for weight reduction to move forward with knee arthroplasty.  CCed PCP about elevated creatinine while on diclofenac.  Plan to add in short walks and chair exercises as tolerated.   Other iron deficiency anemia Assessment & Plan: Reviewed recent labs with patient.  Her hemoglobin was low at 8.7.  By her report, she has required IV iron infusions in the past and has complaints of feeling cold all the time and fatigue.  She denies visible hematuria, melena or hematochezia.  She denies gingival bleeding.  She denies intake of a vegan diet.  She reports being up-to-date with her colorectal cancer screening, managed by Dr. Christain Sacramento.  She has not been taking an over-the-counter iron supplement on a daily basis.  She is hemodynamically stable.  Begin ferrous gluconate 324 mg prescription iron twice daily.  Referral made to hematology for further management.  Orders: -     Ambulatory referral to Hematology / Oncology -     Ferrous Gluconate; Take 1 tablet (324 mg total) by mouth 2 (two) times daily with a meal.  Dispense: 60 tablet; Refill: 0      She was informed of the importance of frequent follow up visits to maximize her success with intensive lifestyle modifications for her multiple health conditions.   ATTESTASTION STATEMENTS:  Reviewed by clinician on day of visit: allergies, medications, problem list, medical history, surgical history, family  history, social history, and previous encounter notes pertinent to obesity diagnosis.   I have personally spent 30 minutes total time today in preparation, patient care, nutritional counseling and documentation for this visit, including the following: review of clinical lab tests; review of medical tests/procedures/services.      Glennis Brink, DO DABFM, DABOM [facility] [address] [place], [place] [address] [phone]

## 2023-04-18 NOTE — Assessment & Plan Note (Deleted)
Reviewed recent labs with patient.  Her hemoglobin was low at 8.7.  By her report, she has required IV iron infusions in the past and has complaints of feeling cold all the time and fatigue.  She denies visible hematuria, melena or hematochezia.  She denies gingival bleeding.  She denies intake of a vegan diet.  She reports being up-to-date with her colorectal cancer screening, managed by Dr. Christain Sacramento.  She has not been taking an over-the-counter iron supplement on a daily basis.  She is hemodynamically stable.  Begin ferrous gluconate 324 mg prescription iron twice daily.  Referral made to hematology for further management.

## 2023-04-18 NOTE — Assessment & Plan Note (Signed)
Currently using a cane to ambulate.  She has limited ability to walk for exercise due to knee pain.  She has been taking diclofenac 75 mg twice daily and has a written prescription for narcotics.  She is actively working on BMI reduction in order to have bilateral knee arthroplasty done.  She is managed by orthopedics.  Continue active plan for weight reduction to move forward with knee arthroplasty.  CCed PCP about elevated creatinine while on diclofenac.  Plan to add in short walks and chair exercises as tolerated.

## 2023-04-18 NOTE — Assessment & Plan Note (Signed)
New.  Reviewed most recent labs with patient.  Her creatinine was 1.29 with a GFR of 48.  In review of her medication list, she has been taking diclofenac 75 mg twice daily for bilateral DJD pain in her knees, prescribed by her PCP.  She is awaiting bilateral knee arthroplasty once her BMI is under 40.  Will CC her PCP for further management.

## 2023-04-18 NOTE — Assessment & Plan Note (Signed)
Last vitamin D Lab Results  Component Value Date   VD25OH 18.8 (L) 02/27/2023   Reviewed lab from last visit.  Explained problems with vitamin D deficiency including fatigue, poor bone health and poor immune function.  She does have complaints of fatigue.  She is currently not taking a vitamin D supplement.  Begin vitamin D 50,000 IU once weekly.  Recheck level in 3 to 4 months.

## 2023-04-18 NOTE — Assessment & Plan Note (Signed)
Reviewed recent labs with patient.  Her hemoglobin was low at 8.7.  By her report, she has required IV iron infusions in the past and has complaints of feeling cold all the time and fatigue.  She denies visible hematuria, melena or hematochezia.  She denies gingival bleeding.  She denies intake of a vegan diet.  She reports being up-to-date with her colorectal cancer screening, managed by Dr. Christain Sacramento.  She has not been taking an over-the-counter iron supplement on a daily basis.  She is hemodynamically stable.  Begin ferrous gluconate 324 mg prescription iron twice daily.  Referral made to hematology for further management.

## 2023-04-23 ENCOUNTER — Ambulatory Visit: Payer: 59

## 2023-04-23 ENCOUNTER — Other Ambulatory Visit: Payer: Self-pay

## 2023-04-26 ENCOUNTER — Other Ambulatory Visit: Payer: Self-pay

## 2023-04-26 ENCOUNTER — Encounter
Payer: Medicare Other | Attending: Physical Medicine and Rehabilitation | Admitting: Physical Medicine and Rehabilitation

## 2023-04-26 DIAGNOSIS — I890 Lymphedema, not elsewhere classified: Secondary | ICD-10-CM | POA: Diagnosis not present

## 2023-04-26 DIAGNOSIS — G4733 Obstructive sleep apnea (adult) (pediatric): Secondary | ICD-10-CM | POA: Diagnosis not present

## 2023-04-26 DIAGNOSIS — E559 Vitamin D deficiency, unspecified: Secondary | ICD-10-CM

## 2023-04-26 DIAGNOSIS — M170 Bilateral primary osteoarthritis of knee: Secondary | ICD-10-CM | POA: Diagnosis not present

## 2023-04-26 MED ORDER — VITAMIN D (ERGOCALCIFEROL) 1.25 MG (50000 UNIT) PO CAPS
50000.0000 [IU] | ORAL_CAPSULE | ORAL | 0 refills | Status: DC
Start: 2023-04-26 — End: 2023-08-26
  Filled 2023-04-26: qty 12, 84d supply, fill #0
  Filled 2023-05-21 (×2): qty 4, 28d supply, fill #0
  Filled 2023-07-03: qty 4, 28d supply, fill #1
  Filled 2023-08-12: qty 4, 28d supply, fill #2

## 2023-04-26 MED ORDER — MELOXICAM 15 MG PO TABS
15.0000 mg | ORAL_TABLET | Freq: Every day | ORAL | 3 refills | Status: AC | PRN
Start: 2023-04-26 — End: ?
  Filled 2023-04-26: qty 30, 30d supply, fill #0
  Filled 2023-11-25: qty 30, 30d supply, fill #1

## 2023-04-26 NOTE — Progress Notes (Addendum)
Subjective:    Patient ID: Caroline Ryan, female    DOB: 1966-01-18, 57 y.o.   MRN: [id number]  HPI   Caroline Ryan presents for f/u of impaired mobility secondary to bilateral knee pain, obesity,leg swelling, sickle cell disease, and nasal congestion  1) Bilateral knee pain  -We discussed her MRI results previously and she would like to pursue arthroscopic debridement if surgeon feels this would be appropriate. Stairs worsen her pain- she uses rails and tries to minimize using the stairs. The other day her legs gave way.  -she would like to try the compounding cream -she would like repeat steroid injection as she is going to be going on vacation during the first week of August and would like to be as active as possible then  -Her pain has been well controlled, was a little sore after Zilretta injections but feeling a little better. She asked what exactly Zilretta injections contain. She had good response to prior Zilretta injection in May, repeated today, see separate procedure note  -she asks for toradol  2) Hypertension BP 152/92 in office previously. It is usually 140/70. Diet has ben pretty good but she still struggles to lose weight.   The cream was too expensive for her at [facility]. She has been trying to walk. Her mood is a little more down today. The handicap sticker allows her to go out more.  She has been taking ibuprofen 800mg  and Gabapentin. She has tried Tylenol in the past and it does not help much. She would like to repeat Monovisc next visit in December.   3) Obesity -weight has decreased to 240 lbs -Last eats at 8pm -she is frustrated that [employer] Lutes is not covered for her -Asks if I may write a letter regarding why [facility] L[facility] would be beneficial for her and I have done so, she asks that we mail this to her and she plans to talk with her insurance company.   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 She is unable to increase her hours due to her limited mobility from her knee pain. She asks about disability as she currently receives limited financial payment and it is difficult for her to pay her bills. She does not have much knowledge of computers to try for a computer-based work-from-home job.   5) Nasal congestion: -she asks for flonase  6) Sickle cell disease -she would like to see a specialist  7) leg swelling -she asks if this is lymphedema.  -her swelling is worsenined  Prior history: She is feeling very fatigued today. I have bene unable to get her into see the OBGYN as they did not accept her insurance.   Prior history:  Caroline Ryan is a 57 year old woman who presents with bilateral knee OA. Last visit we did bilateral knee viscosupplementation injections that has helped her so much.   She was helping her dad move last night.   Her BP is well controlled.   She is taking the Norco about twice per day. This is the only medication that helps to ease her pain.   She hopes to be able to do shopping and spend time with her daughter.   She is 257 lbs, same weight is last time.   She is walking more than last month.   She wants to try a juice diet and help get her weight  off. She has been eating fruits and vegetables.  She has been noting sharp pains shooting down her legs and arms. She felt weak when she had this pain. Her arm buckled when she had this pain.  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   Pain is 9/10 on average, and 7/10 right now.   She was diagnosed with fibroids and is stressed by the fact that she has not heard from the gynecologist yet.   She is also having hot flashes from menopause and asks what kind of treatments are available.   Pain Inventory Average Pain 8 Pain Right Now 8 My pain is sharp, burning and stabbing  In the last 24 hours, has pain interfered with the following? General activity  5 Relation with others 7 Enjoyment of life 6 What TIME of day is your pain at its worst? daytime Sleep (in general) Fair  Pain is worse with: walking and sitting some activities bending  Pain improves with: rest, heat/ice, therapy/exercise, medication, TENS and injections Relief from Meds: 9  Family History  Problem Relation Age of Onset   Breast cancer Mother 51   Diabetes Father    Hypertension Father    Diabetes Brother    Diabetes Brother    Other Neg Hx    Colon polyps Neg Hx    Colon cancer Neg Hx    Esophageal cancer Neg Hx    Stomach cancer Neg Hx    Rectal cancer Neg Hx    Social History   Socioeconomic History   Marital status: Single    Spouse name: Not on file   Number of children: Not on file   Years of education: Not on file   Highest education level: Not on file  Occupational History   Not on file  Tobacco Use   Smoking status: Former    Current packs/day: 0.00    Average packs/day: 0.3 packs/day for 2.0 years (0.5 ttl pk-yrs)    Types: Cigarettes    Start date: 08/20/2012    Quit date: 08/20/2014    Years since quitting: 8.6   Smokeless tobacco: Never  Vaping Use   Vaping status: Never Used  Substance and Sexual Activity   Alcohol use: Yes    Alcohol/week: 2.0 standard drinks of alcohol    Types: 2 Glasses of wine per week   Drug use: No   Sexual activity: Yes    Birth control/protection: Condom  Other Topics Concern   Not on file  Social History Narrative   Not on file   Social Determinants of Health   Financial Resource Strain: High Risk (08/21/2022)   Overall Financial Resource Strain (CARDIA)    Difficulty of Paying Living Expenses: Very hard  Food Insecurity: Food Insecurity Present (08/21/2022)   Hunger Vital Sign    Worried About Running Out of Food in the Last Year: Often true    Ran Out of Food in the Last Year: Often true  Transportation Needs: No Transportation Needs (08/21/2022)   PRAPARE - Administrator, Civil Service  (Medical): No    Lack of Transportation (Non-Medical): No  Physical Activity: Inactive (08/21/2022)   Exercise Vital Sign    Days of Exercise per Week: 0 days    Minutes of Exercise per Session: 0 min  Stress: Stress Concern Present (07/27/2022)   [employer] of Occupational Health - Occupational Stress Questionnaire    Feeling of Stress : Very much  Social Connections: Socially Isolated (08/21/2022)  Social Connection and Isolation Panel [NHANES]    Frequency of Communication with Friends and Family: More than three times a week    Frequency of Social Gatherings with Friends and Family: Never    Attends Religious Services: Never    Database administrator or Organizations: No    Attends Banker Meetings: Never    Marital Status: Never married   Past Surgical History:  Procedure Laterality Date   ABDOMINAL HYSTERECTOMY     BILATERAL SALPINGECTOMY Bilateral 10/10/2017   Procedure: BILATERAL SALPINGECTOMY;  Surgeon: Hermina Staggers, MD;  Location: [facility];  Service: Gynecology;  Laterality: Bilateral;   BREAST BIOPSY     US guided core 2009   BREAST EXCISIONAL BIOPSY     right 1982 left 1981   BREAST SURGERY     bilateral cysts/benign   COLONOSCOPY  10/17/2021   VAGINAL HYSTERECTOMY N/A 10/10/2017   Procedure: HYSTERECTOMY VAGINAL;  Surgeon: Hermina Staggers, MD;  Location: [facility];  Service: Gynecology;  Laterality: N/A;   Past Surgical History:  Procedure Laterality Date   ABDOMINAL HYSTERECTOMY     BILATERAL SALPINGECTOMY Bilateral 10/10/2017   Procedure: BILATERAL SALPINGECTOMY;  Surgeon: Hermina Staggers, MD;  Location: [facility];  Service: Gynecology;  Laterality: Bilateral;   BREAST BIOPSY     US guided core 2009   BREAST EXCISIONAL BIOPSY     right 1982 left 1981   BREAST SURGERY     bilateral cysts/benign   COLONOSCOPY  10/17/2021   VAGINAL HYSTERECTOMY N/A 10/10/2017   Procedure: HYSTERECTOMY VAGINAL;  Surgeon: Hermina Staggers, MD;  Location: [facility];   Service: Gynecology;  Laterality: N/A;   Past Medical History:  Diagnosis Date   Anemia    Ascending aorta dilatation (HCC)    38mm by echo 07/2021 and 40mm by echo 07/2022   Asthma    Depression    no meds   Depression    Eczema    Fibroid    Headache(784.0)    otc prn med   History of blood transfusion    Hypertension    Knee pain, bilateral    arthralgia knee pain bilateral   Lower extremity edema    Mitral regurgitation    Mild to moderate by echo 07/2021   Seasonal allergies    Sleep apnea    SVD (spontaneous vaginal delivery)    x 3   Vaginal polyp 05/2020   BP 111/73   Pulse 85   Ht 5' (1.524 m)   Wt 240 lb (108.9 kg)   LMP 04/03/2017 (Approximate)   SpO2 98%   BMI 46.87 kg/m   Opioid Risk Score:   Fall Risk Score:  `1  Depression screen PHQ 2/9     03/27/2023    2:28 PM 09/21/2022    1:39 PM 08/21/2022   10:11 AM 07/27/2022   11:40 AM 07/09/2022   11:13 AM 05/21/2022    2:23 PM 05/14/2022    3:39 PM  Depression screen PHQ 2/9  Decreased Interest    3 0 0 0  Down, Depressed, Hopeless    3 0 2 3  PHQ - 2 Score    6 0 2 3  Altered sleeping    1  1   Tired, decreased energy    1  1   Change in appetite    0  0   Feeling bad or failure about yourself     2  0   Trouble concentrating  0  0   Moving slowly or fidgety/restless    0  0   Suicidal thoughts    0  0   PHQ-9 Score    10  4   Difficult doing work/chores    Very difficult  Somewhat difficult      Information is confidential and restricted. Go to Review Flowsheets to unlock data.    Review of Systems  Constitutional: Negative.   HENT: Negative.    Eyes: Negative.   Respiratory: Negative.    Cardiovascular: Negative.   Gastrointestinal: Negative.   Endocrine: Negative.   Genitourinary: Negative.   Musculoskeletal:  Positive for arthralgias and gait problem.  Skin: Negative.   Allergic/Immunologic: Negative.   Hematological: Negative.   Psychiatric/Behavioral: Negative.    All other  systems reviewed and are negative.      Objective:   Physical Exam Gen: no distress, normal appearing HEENT: oral mucosa pink and moist, NCAT Cardio: Reg rate Chest: normal effort, normal rate of breathing Abd: soft, non-distended Ext: no edema Psych: pleasant, normal affect Skin: intact Neuro: Alert and oriented x3 MSK: has edema in lower extremities, but primarily located around knees and thighs and not in the lower leg Assessment & Plan:  Mrs. Peretz is a 56 year old woman presenting to establish care for bilateral chronic knee pain, neck pain.   1) Bilateral knee OA: -XRs reviewed and consistent with OA -vitamin D prescribed -discussed that she is trying to get disability Discussed extracorporeal shockwave therapy as a modality for treatment. Discussed that the device looks and feels like a massage gun and I would move it over the area of pain for about 10 minutes. The device releases sound waves to the area of pain and helps to improve blood flow and circulation to improve the healing process. Discuss that this initially induces inflammation and can sometimes cause short-term increase in pain. Discussed that we typically do three weekly treatments, but sometimes up to 6 if needed, and after 6 weeks long term benefits can sometimes be achieved. Discussed that this is an FDA approved device, but not covered by insurance and would cost $60 per session. Will scheduled patient for 6 consecutive appointments and can cancel latter three if benefits are achieved after first three sessions.   -prescribed meloxicam, discussed risks and nenefits -Reviewed MRI results which show bilaterals severe osteoarthritis which is worst in the medial compartment, as well as severe bilateral cartilage damage. Discussed that arthroscopic debridement may be an option for her since she has been asked to lose weight prior to TKA and this has been difficult for her.  -She received steroid injections  q39months. -She had viscosupplementation injections in July with excellent benefits.  -Vitamin D level normal.  -Recommended blue emu oil -Discussed her walking.Made goal to increase to 15 minutes at least 4 times per week.  -Discussed that every pound she loses is 6 lbs off her knees.  -Increase Gabapentin to 600mg  TID. Start at night and don't drive initially after starting higher dose as could make her very sleepy. Educated that Gabapentin can help with her hot flashes.  -continue to work on weight loss -Schedule bilateral steroid injection on Friday July 28th, discussed with patient, staff to get prior auth -discussed response to prior Zilretta injection and that this is a safer option for the joint than regular steroid, but may not be approved more frequently than every 3 months -Called in compounding cream of Diclofenac 3%, Gabapentin 5%, Lidocaine 5%, Menthol 1%  compounded at [facility]:  apply 1-2 pumps three to four times per day as needed    2) Muscle cramps:magneisum level was normal last visit.   3) Morbid obesity: -commended on weight loss! it. Discussed intermittent fasting and low carb diet. Continue healthy diet. Made goal to start eating at 11:15am instead of 11am and to move dinner time from 8:30pm to 8:15pm. She has moved this to 8pm, discussed trying to move to 7:45pm.  HgbA1C 5.2- discussed that there is no prediabetes -referred to bariatric surgery -wrote letter for her explaining why she would benefit from [facility] -discussed that she may want to hold off on further Zilretta injections as this is a steroid injection that can cause weight gain  4) General health -Reviewed to gynecology for pap smear -Reviewed lipids and were normal last year.   5) Radicular symptoms in upper and lower extremities: -no evidence of weakness on exam to suggest myelopathy -Cervical XR results reviewed with her: they show mild spondylosis at C5-6 and C6-7.  No acute fracture. Lumbar  XR reviewed with patient and shows 1. No acute compression fracture. 2. A 7 mm anterolisthesis of L4 on L5, presumably degenerative in etiology. 3. Multilevel degenerative disc disease and facet arthrosis. -Patient worried about heart attack- advised that symptoms appear to be more radicular and are not accompanied by chest pain.   6) Palpitations:  -She has had occasional episodes -Denies chest pain. She gets SOB due to her asthma.   7) Iron deficiency anemia: Iron was very low in 2018- she has a history of menorrhagia and so she could continue to have anemia. Hemoglobin was within normal limits when checked in May. Discussed iron rich foods as she prefers no supplement at this time.   8) hypertension: Start clonodine patch 0.1mg .   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 -discussed that she was previously denies disability and that she is impaired in her ability to walk and stand due to her knee pain -discussed that she should look for work that requires upper body strength or cognition -discussed that I can complete disability paperwork for her addressing her current limitations -encouraged continued lifestyle changes to help reduce weight and knee pain  10) Nasal congestion -prescribed flonase  11) Leg swelling: -referred to hematology/oncology to assist in identifying if this is lymphedema -encouraged to follow-up with her PCP  12) Sickle cell disease -referred to hematology  13) Lymphedema: continue lymphatic drainage  14) Sleep apnea: -encouraged avoiding ultraprocessed foods to reduce visceral fat on the tongue

## 2023-04-26 NOTE — Progress Notes (Unsigned)
[facility]   [phone] [phone]   Clinic New consult Note   Patient Care Team: Ivonne Andrew, NP as PCP - General (Pulmonary Disease) [name], MD as PCP - Cardiology (Cardiology) Heidi Dach, RN as Case Manager Shaune Leeks as Social Worker 04/27/2023  CHIEF COMPLAINTS/PURPOSE OF CONSULTATION:  ANEMIA   REFERRING PHYSICIAN: Glennis Brink, DO   HISTORY OF PRESENTING ILLNESS:  Caroline Ryan 57 y.o. female is here because of anemia, she was referred by Dr. Cathey Endow.  She presents to the clinic by herself.  She has been anemic for more than 10 years, her first CBC in our system was from 2009, which showed a hemoglobin 9.2.  She had intermittent severe anemia with a hemoglobin in 5-7 range from 20 16-20 18, and required a blood transfusion.  Previous lab test showed severe iron deficiency.  Her hemoglobin has been in the range of 8-11.4 since 2018, the highest hemoglobin was 11.4 in May 2020 with normal MCV.  She underwent hysterectomy in 2019, and her hemoglobin improved to 11.4 in May 2020.  She subsequently developed anemia again, with hemoglobin 9.5 in March 2024, and dropped further to 8.7 in July 2024.  Her WBC has been normal, platelet count slightly low around 140 since March 2024.  Her last colonoscopy was February 2023, which showed a few polyps.  She has severe bilateral arthritis in her knees, and by orthopedic surgeon, and need knee replacement.  Due to her obesity, she was referred to weight management clinic.  She has been seeing Dr. Cathey Endow since May 2024, and was referred to [place] due to worsening anemia.  She has mild fatigue, no significant chest pain or dyspnea on exertion.  She is not physically active due to her knee pain.  MEDICAL HISTORY:  Past Medical History:  Diagnosis Date   Anemia    Ascending aorta dilatation (HCC)    38mm by echo 07/2021 and 40mm by echo 07/2022   Asthma    Depression    no meds    Depression    Eczema    Fibroid    Headache(784.0)    otc prn med   History of blood transfusion    Hypertension    Knee pain, bilateral    arthralgia knee pain bilateral   Lower extremity edema    Mitral regurgitation    Mild to moderate by echo 07/2021   Seasonal allergies    Sleep apnea    SVD (spontaneous vaginal delivery)    x 3   Vaginal polyp 05/2020    SURGICAL HISTORY: Past Surgical History:  Procedure Laterality Date   ABDOMINAL HYSTERECTOMY     BILATERAL SALPINGECTOMY Bilateral 10/10/2017   Procedure: BILATERAL SALPINGECTOMY;  Surgeon: Hermina Staggers, MD;  Location: [facility];  Service: Gynecology;  Laterality: Bilateral;   BREAST BIOPSY     US guided core 2009   BREAST EXCISIONAL BIOPSY     right 1982 left 1981   BREAST SURGERY     bilateral cysts/benign   COLONOSCOPY  10/17/2021   VAGINAL HYSTERECTOMY N/A 10/10/2017   Procedure: HYSTERECTOMY VAGINAL;  Surgeon: Hermina Staggers, MD;  Location: [facility];  Service: Gynecology;  Laterality: N/A;    SOCIAL HISTORY: Social History   Socioeconomic History   Marital status: Single    Spouse name: Not on file   Number of children: 3   Years of education: Not on file   Highest education level:  Not on file  Occupational History   Not on file  Tobacco Use   Smoking status: Former    Current packs/day: 0.00    Average packs/day: 0.3 packs/day for 2.0 years (0.5 ttl pk-yrs)    Types: Cigarettes    Start date: 08/20/2012    Quit date: 08/20/2014    Years since quitting: 8.6   Smokeless tobacco: Never  Vaping Use   Vaping status: Never Used  Substance and Sexual Activity   Alcohol use: Yes    Alcohol/week: 2.0 standard drinks of alcohol    Types: 2 Glasses of wine per week    Comment: once a week   Drug use: No   Sexual activity: Yes    Birth control/protection: Condom  Other Topics Concern   Not on file  Social History Narrative   Not on file   Social Determinants of Health   Financial Resource Strain:  High Risk (08/21/2022)   Overall Financial Resource Strain (CARDIA)    Difficulty of Paying Living Expenses: Very hard  Food Insecurity: Food Insecurity Present (08/21/2022)   Hunger Vital Sign    Worried About Running Out of Food in the Last Year: Often true    Ran Out of Food in the Last Year: Often true  Transportation Needs: No Transportation Needs (08/21/2022)   PRAPARE - Administrator, Civil Service (Medical): No    Lack of Transportation (Non-Medical): No  Physical Activity: Inactive (08/21/2022)   Exercise Vital Sign    Days of Exercise per Week: 0 days    Minutes of Exercise per Session: 0 min  Stress: Stress Concern Present (07/27/2022)   [employer] of Occupational Health - Occupational Stress Questionnaire    Feeling of Stress : Very much  Social Connections: Socially Isolated (08/21/2022)   Social Connection and Isolation Panel [NHANES]    Frequency of Communication with Friends and Family: More than three times a week    Frequency of Social Gatherings with Friends and Family: Never    Attends Religious Services: Never    Database administrator or Organizations: No    Attends Banker Meetings: Never    Marital Status: Never married  Intimate Partner Violence: Not At Risk (08/21/2022)   Humiliation, Afraid, Rape, and Kick questionnaire    Fear of Current or Ex-Partner: No    Emotionally Abused: No    Physically Abused: No    Sexually Abused: No    FAMILY HISTORY: Family History  Problem Relation Age of Onset   Breast cancer Mother 68   Diabetes Father    Hypertension Father    Diabetes Brother    Diabetes Brother    Other Neg Hx    Colon polyps Neg Hx    Colon cancer Neg Hx    Esophageal cancer Neg Hx    Stomach cancer Neg Hx    Rectal cancer Neg Hx     ALLERGIES:  has No Known Allergies.  MEDICATIONS:  Current Outpatient Medications  Medication Sig Dispense Refill   acetaminophen (TYLENOL) 325 MG tablet Take 650 mg by mouth daily  as needed for moderate pain, headache or fever.     albuterol (VENTOLIN HFA) 108 (90 Base) MCG/ACT inhaler Inhale 1 puff every 6 hours as needed for shortness of breath 18 g 5   aspirin 81 MG chewable tablet Chew by mouth daily.     buPROPion (WELLBUTRIN XL) 150 MG 24 hr tablet Take 1 tablet (150 mg total) by mouth  daily. 30 tablet 3   carvedilol (COREG) 25 MG tablet Take 1 tablet (25 mg total) by mouth 2 (two) times daily. 180 tablet 3   cetirizine (ZYRTEC) 10 MG tablet Take 1 tablet (10 mg total) by mouth daily. 30 tablet 6   ciclopirox (LOPROX) 0.77 % cream Apply topically 2 (two) times daily. 15 g 0   cyclobenzaprine (FLEXERIL) 5 MG tablet Take 1 tablet (5 mg total) by mouth 3 (three) times daily as needed for muscle spasms. 30 tablet 0   diclofenac (VOLTAREN) 75 MG EC tablet Take 1 tablet (75 mg total) by mouth 2 (two) times daily. 30 tablet 0   diclofenac Sodium (VOLTAREN) 1 % GEL Apply 4 g topically 4 (four) times daily. 100 g 3   docusate sodium (COLACE) 100 MG capsule Take 1 capsule (100 mg total) by mouth 2 (two) times daily as needed. 30 capsule 2   ferrous gluconate (FERGON) 324 MG tablet Take 1 tablet (324 mg total) by mouth 2 (two) times daily with a meal. 60 tablet 0   fluticasone (FLONASE) 50 MCG/ACT nasal spray Place 1 spray into both nostrils daily. (Patient taking differently: Place 1 spray into both nostrils daily as needed for allergies.) 16 g 2   hydrocortisone 2.5 % cream Apply topically 2 (two) times daily. (Patient taking differently: Apply 1 Application topically daily as needed (itching).) 30 g 1   hydrOXYzine (ATARAX) 25 MG tablet Take 1 tablet (25 mg total) by mouth 3 (three) times daily as needed. 30 tablet 11   ibuprofen (ADVIL) 800 MG tablet Take 1 tablet (800 mg total) by mouth 3 (three) times daily. 30 tablet 6   ipratropium-albuterol (DUONEB) 0.5-2.5 (3) MG/3ML SOLN Take 3 mLs by nebulization every 6 (six) hours as needed. 360 mL 0   linaclotide (LINZESS) 72 MCG  capsule Take 1 capsule (72 mcg total) by mouth daily before breakfast. 30 capsule 0   meloxicam (MOBIC) 15 MG tablet Take 1 tablet (15 mg total) by mouth daily as needed for pain. 90 tablet 3   methocarbamol (ROBAXIN-750) 750 MG tablet Take 1 tablet (750 mg total) by mouth 4 (four) times daily. (Patient taking differently: Take 750 mg by mouth every 6 (six) hours as needed for muscle spasms.) 120 tablet 3   nitroGLYCERIN (NITROSTAT) 0.4 MG SL tablet Place 1 tablet (0.4 mg total) under the tongue every 5 (five) minutes as needed for chest pain (max 3 doses, if no relief call 911). 25 tablet 5   NONFORMULARY OR COMPOUNDED ITEM Diclofenac 3%, Gabapentin 5%, Lidocaine 5%, Menthol 1%.  Apply 1-2 pumps three to four times per day as needed 1 each 0   olmesartan (BENICAR) 40 MG tablet Take 1 tablet (40 mg total) by mouth daily. 90 tablet 3   Respiratory Therapy Supplies (NEBULIZER MASK ADULT) MISC Use this mask with your nebulizer. 1 each 0   spironolactone (ALDACTONE) 100 MG tablet Take 1 tablet (100 mg total) by mouth daily. 90 tablet 3   triamcinolone cream (KENALOG) 0.5 % Apply 1 Application topically 3 (three) times daily. 30 g 0   Vitamin D, Ergocalciferol, (DRISDOL) 1.25 MG (50000 UNIT) CAPS capsule Take 1 capsule (50,000 Units total) by mouth every 7 (seven) days. 12 capsule 0   furosemide (LASIX) 20 MG tablet Take 1 tablet (20 mg total) by mouth daily. 30 tablet 6   Current Facility-Administered Medications  Medication Dose Route Frequency Provider Last Rate Last Admin   Triamcinolone Acetonide (ZILRETTA) intra-articular injection 32  mg  32 mg Intra-articular Once Raulkar, Drema Pry, MD       Triamcinolone Acetonide (ZILRETTA) intra-articular injection 32 mg  32 mg Intra-articular Once Raulkar, Drema Pry, MD        REVIEW OF SYSTEMS:   Constitutional: Denies fevers, chills or abnormal night sweats, (+) fatigue  Eyes: Denies blurriness of vision, double vision or watery eyes Ears, nose, mouth,  throat, and face: Denies mucositis or sore throat Respiratory: Denies cough, dyspnea or wheezes Cardiovascular: Denies palpitation, chest discomfort or lower extremity swelling Gastrointestinal:  Denies nausea, heartburn or change in bowel habits Skin: Denies abnormal skin rashes Lymphatics: Denies new lymphadenopathy or easy bruising Neurological:Denies numbness, tingling or new weaknesses Behavioral/Psych: Mood is stable, no new changes  All other systems were reviewed with the patient and are negative.  PHYSICAL EXAMINATION: ECOG PERFORMANCE STATUS: 2 - Symptomatic, <50% confined to bed  Vitals:   04/27/23 0929  BP: 108/64  Pulse: 79  Resp: 16  Temp: 97.9 F (36.6 C)  SpO2: 100%   Filed Weights   04/27/23 0929  Weight: 240 lb 12.8 oz (109.2 kg)    GENERAL:alert, no distress and comfortable SKIN: skin color, texture, turgor are normal, no rashes or significant lesions EYES: normal, conjunctiva are pink and non-injected, sclera clear OROPHARYNX:no exudate, no erythema and lips, buccal mucosa, and tongue normal  NECK: supple, thyroid normal size, non-tender, without nodularity LYMPH:  no palpable lymphadenopathy in the cervical, axillary or inguinal LUNGS: clear to auscultation and percussion with normal breathing effort HEART: regular rate & rhythm and no murmurs and no lower extremity edema ABDOMEN:abdomen soft, non-tender and normal bowel sounds Musculoskeletal:no cyanosis of digits and no clubbing  PSYCH: alert & oriented x 3 with fluent speech NEURO: no focal motor/sensory deficits  LABORATORY DATA:  I have reviewed the data as listed    Latest Ref Rng & Units 04/27/2023   10:11 AM 02/27/2023    9:24 AM 10/23/2022   11:19 AM  CBC  WBC 4.0 - 10.5 K/uL 5.4  4.7  10.9   Hemoglobin 12.0 - 15.0 g/dL 9.2  8.7  9.5   Hematocrit 36.0 - 46.0 % 29.4  27.4  32.2   Platelets 150 - 400 K/uL 164  137  141        Latest Ref Rng & Units 02/27/2023    9:24 AM 10/23/2022    1:11  PM 05/14/2022    4:08 PM  CMP  Glucose 70 - 99 mg/dL 83  161  92   BUN 6 - 24 mg/dL 24  19  24    Creatinine 0.57 - 1.00 mg/dL 0.96  0.45  4.09   Sodium 134 - 144 mmol/L 143  135  142   Potassium 3.5 - 5.2 mmol/L 4.1  4.2  4.9   Chloride 96 - 106 mmol/L 106  104  103   CO2 20 - 29 mmol/L 21  22  25    Calcium 8.7 - 10.2 mg/dL 81.1  9.2  91.4   Total Protein 6.0 - 8.5 g/dL 6.8   7.4   Total Bilirubin 0.0 - 1.2 mg/dL 0.3   0.4   Alkaline Phos 44 - 121 IU/L 106   107   AST 0 - 40 IU/L 11   16   ALT 0 - 32 IU/L 16   18      RADIOGRAPHIC STUDIES: I have personally reviewed the radiological images as listed and agreed with the findings in the report. No results  found.  ASSESSMENT & PLAN:  57 year old female with past medical history of iron deficient anemia, depression, hypertension, osteoarthritis in knees, sleep apnea, was referred to [place] for worsening anemia  Macrocytic anemia -She had a significant anemia of iron deficiency from menorrhagia in the past, required blood transfusion. -She had a hysterectomy in 2019, anemia improved.  However she has developed moderate anemia again since March 2024. -Her iron study from February 27, 2023 was normal, B12 and folate was over normal.  She does have mild elevated creatinine, however her degree of anemia is out of portion of her mild CKD. -I will check anemia workup today, including reticulocyte count, LDH, haptoglobin, heavy metal panel, SPEP with immunofixation, erythropoietin level, hepatitis panel, HIV, hemoglobin electrophoresis.  -If the above lab work is unremarkable, and her repeat CBC shows persistent anemia, then I will recommend a bone marrow biopsy to rule out primary bone marrow disease, such as MDS -Will consider blood transfusion if hemoglobin less than 8.0.  I will send a sample to blood bank today  Plan -lab today -Phone visit in 2 weeks to review the lab results, and decide if she needs a bone marrow biopsy.  All questions were  answered. The patient knows to call the clinic with any problems, questions or concerns. I spent 35 minutes counseling the patient face to face. The total time spent in the appointment was 45 minutes and more than 50% was on counseling.     Malachy Mood, MD 04/27/23 10:52 AM

## 2023-04-26 NOTE — Addendum Note (Signed)
Addended by: [name] on: 04/26/2023 11:22 AM   Modules accepted: Orders

## 2023-04-27 ENCOUNTER — Encounter: Payer: Self-pay | Admitting: Hematology

## 2023-04-27 ENCOUNTER — Inpatient Hospital Stay: Payer: Medicare Other | Attending: Hematology | Admitting: Hematology

## 2023-04-27 ENCOUNTER — Other Ambulatory Visit: Payer: Self-pay

## 2023-04-27 ENCOUNTER — Inpatient Hospital Stay: Payer: Medicare Other

## 2023-04-27 VITALS — BP 108/64 | HR 79 | Temp 97.9°F | Resp 16 | Ht 60.0 in | Wt 240.8 lb

## 2023-04-27 DIAGNOSIS — R5383 Other fatigue: Secondary | ICD-10-CM | POA: Diagnosis not present

## 2023-04-27 DIAGNOSIS — Z5986 Financial insecurity: Secondary | ICD-10-CM | POA: Diagnosis not present

## 2023-04-27 DIAGNOSIS — Z833 Family history of diabetes mellitus: Secondary | ICD-10-CM | POA: Insufficient documentation

## 2023-04-27 DIAGNOSIS — D500 Iron deficiency anemia secondary to blood loss (chronic): Secondary | ICD-10-CM | POA: Insufficient documentation

## 2023-04-27 DIAGNOSIS — I129 Hypertensive chronic kidney disease with stage 1 through stage 4 chronic kidney disease, or unspecified chronic kidney disease: Secondary | ICD-10-CM | POA: Diagnosis not present

## 2023-04-27 DIAGNOSIS — Z7182 Exercise counseling: Secondary | ICD-10-CM | POA: Insufficient documentation

## 2023-04-27 DIAGNOSIS — D649 Anemia, unspecified: Secondary | ICD-10-CM

## 2023-04-27 DIAGNOSIS — G8929 Other chronic pain: Secondary | ICD-10-CM | POA: Diagnosis not present

## 2023-04-27 DIAGNOSIS — J45909 Unspecified asthma, uncomplicated: Secondary | ICD-10-CM | POA: Diagnosis not present

## 2023-04-27 DIAGNOSIS — Z90710 Acquired absence of both cervix and uterus: Secondary | ICD-10-CM | POA: Insufficient documentation

## 2023-04-27 DIAGNOSIS — N179 Acute kidney failure, unspecified: Secondary | ICD-10-CM | POA: Diagnosis not present

## 2023-04-27 DIAGNOSIS — N920 Excessive and frequent menstruation with regular cycle: Secondary | ICD-10-CM | POA: Insufficient documentation

## 2023-04-27 DIAGNOSIS — Z87891 Personal history of nicotine dependence: Secondary | ICD-10-CM | POA: Diagnosis not present

## 2023-04-27 DIAGNOSIS — Z79899 Other long term (current) drug therapy: Secondary | ICD-10-CM | POA: Diagnosis not present

## 2023-04-27 DIAGNOSIS — E669 Obesity, unspecified: Secondary | ICD-10-CM | POA: Diagnosis not present

## 2023-04-27 DIAGNOSIS — M170 Bilateral primary osteoarthritis of knee: Secondary | ICD-10-CM | POA: Insufficient documentation

## 2023-04-27 DIAGNOSIS — G4730 Sleep apnea, unspecified: Secondary | ICD-10-CM | POA: Insufficient documentation

## 2023-04-27 DIAGNOSIS — Z5941 Food insecurity: Secondary | ICD-10-CM | POA: Insufficient documentation

## 2023-04-27 DIAGNOSIS — Z8249 Family history of ischemic heart disease and other diseases of the circulatory system: Secondary | ICD-10-CM | POA: Diagnosis not present

## 2023-04-27 DIAGNOSIS — F32A Depression, unspecified: Secondary | ICD-10-CM | POA: Insufficient documentation

## 2023-04-27 DIAGNOSIS — N1831 Chronic kidney disease, stage 3a: Secondary | ICD-10-CM | POA: Diagnosis not present

## 2023-04-27 DIAGNOSIS — Z9079 Acquired absence of other genital organ(s): Secondary | ICD-10-CM | POA: Insufficient documentation

## 2023-04-27 DIAGNOSIS — Z803 Family history of malignant neoplasm of breast: Secondary | ICD-10-CM | POA: Insufficient documentation

## 2023-04-27 LAB — COMPREHENSIVE METABOLIC PANEL
ALT: 13 U/L (ref 0–44)
AST: 12 U/L — ABNORMAL LOW (ref 15–41)
Albumin: 4.3 g/dL (ref 3.5–5.0)
Alkaline Phosphatase: 93 U/L (ref 38–126)
Anion gap: 7 (ref 5–15)
BUN: 37 mg/dL — ABNORMAL HIGH (ref 6–20)
CO2: 26 mmol/L (ref 22–32)
Calcium: 10.1 mg/dL (ref 8.9–10.3)
Chloride: 107 mmol/L (ref 98–111)
Creatinine, Ser: 2.12 mg/dL — ABNORMAL HIGH (ref 0.44–1.00)
GFR, Estimated: 26 mL/min — ABNORMAL LOW (ref >60)
Glucose, Bld: 87 mg/dL (ref 70–99)
Potassium: 4.4 mmol/L (ref 3.5–5.1)
Sodium: 140 mmol/L (ref 135–145)
Total Bilirubin: 0.5 mg/dL (ref 0.3–1.2)
Total Protein: 7.7 g/dL (ref 6.5–8.1)

## 2023-04-27 LAB — CBC WITH DIFFERENTIAL/PLATELET
Abs Immature Granulocytes: 0.01 10*3/uL (ref 0.00–0.07)
Basophils Absolute: 0 10*3/uL (ref 0.0–0.1)
Basophils Relative: 0 %
Eosinophils Absolute: 0.1 10*3/uL (ref 0.0–0.5)
Eosinophils Relative: 2 %
HCT: 29.4 % — ABNORMAL LOW (ref 36.0–46.0)
Hemoglobin: 9.2 g/dL — ABNORMAL LOW (ref 12.0–15.0)
Immature Granulocytes: 0 %
Lymphocytes Relative: 31 %
Lymphs Abs: 1.7 10*3/uL (ref 0.7–4.0)
MCH: 31.9 pg (ref 26.0–34.0)
MCHC: 31.3 g/dL (ref 30.0–36.0)
MCV: 102.1 fL — ABNORMAL HIGH (ref 80.0–100.0)
Monocytes Absolute: 0.4 10*3/uL (ref 0.1–1.0)
Monocytes Relative: 7 %
Neutro Abs: 3.2 10*3/uL (ref 1.7–7.7)
Neutrophils Relative %: 60 %
Platelets: 164 10*3/uL (ref 150–400)
RBC: 2.88 MIL/uL — ABNORMAL LOW (ref 3.87–5.11)
RDW: 13.2 % (ref 11.5–15.5)
WBC: 5.4 10*3/uL (ref 4.0–10.5)
nRBC: 0 % (ref 0.0–0.2)

## 2023-04-27 LAB — RETIC PANEL
Immature Retic Fract: 14.9 % (ref 2.3–15.9)
RBC.: 2.91 MIL/uL — ABNORMAL LOW (ref 3.87–5.11)
Retic Count, Absolute: 78.9 10*3/uL (ref 19.0–186.0)
Retic Ct Pct: 2.7 % (ref 0.4–3.1)
Reticulocyte Hemoglobin: 30.6 pg (ref >27.9)

## 2023-04-27 LAB — FERRITIN: Ferritin: 98 ng/mL (ref 11–307)

## 2023-04-27 LAB — HEPATITIS PANEL, ACUTE
HCV Ab: NONREACTIVE
Hep A IgM: NONREACTIVE
Hep B C IgM: NONREACTIVE
Hepatitis B Surface Ag: NONREACTIVE

## 2023-04-27 LAB — SAMPLE TO BLOOD BANK

## 2023-04-27 LAB — LACTATE DEHYDROGENASE: LDH: 179 U/L (ref 98–192)

## 2023-04-27 LAB — HIV ANTIBODY (ROUTINE TESTING W REFLEX): HIV Screen 4th Generation wRfx: NONREACTIVE

## 2023-04-29 ENCOUNTER — Other Ambulatory Visit: Payer: Self-pay

## 2023-04-29 ENCOUNTER — Telehealth: Payer: Self-pay

## 2023-04-29 ENCOUNTER — Telehealth: Payer: Self-pay | Admitting: Hematology

## 2023-04-29 DIAGNOSIS — E860 Dehydration: Secondary | ICD-10-CM | POA: Insufficient documentation

## 2023-04-29 DIAGNOSIS — D649 Anemia, unspecified: Secondary | ICD-10-CM

## 2023-04-29 DIAGNOSIS — N179 Acute kidney failure, unspecified: Secondary | ICD-10-CM

## 2023-04-29 LAB — KAPPA/LAMBDA LIGHT CHAINS
Kappa free light chain: 31.1 mg/L — ABNORMAL HIGH (ref 3.3–19.4)
Kappa, lambda light chain ratio: 1.51 (ref 0.26–1.65)
Lambda free light chains: 20.6 mg/L (ref 5.7–26.3)

## 2023-04-29 LAB — ERYTHROPOIETIN: Erythropoietin: 15.6 m[IU]/mL (ref 2.6–18.5)

## 2023-04-29 MED ORDER — SODIUM CHLORIDE 0.9 % IV SOLN: INTRAVENOUS | Status: AC

## 2023-04-29 NOTE — Telephone Encounter (Addendum)
Called patient and relayed the message below as per Dr. Mosetta Putt. Patient voiced full understanding.   ----- Message from Malachy Mood sent at 04/29/2023  7:28 AM EDT ----- My nurses,  Please let pt know her Cr is significantly elevated,  worse than 2 months ago. Let her stop lasix, spironolactone, and any NSAIDS (except topical) and drink more water, let her come in for 2hr IVF (1L NS over 2 hrs, please order) this week and repeat lab next week. Please also order renal US this week, and UA. Please schedule her to see me or phone visit (with lab before) late next week, thanks   Malachy Mood  04/29/2023

## 2023-04-30 ENCOUNTER — Ambulatory Visit: Payer: 59

## 2023-04-30 LAB — HGB FRACTIONATION CASCADE
Hgb A2: 2.1 % (ref 1.8–3.2)
Hgb A: 97.9 % (ref 96.4–98.8)
Hgb F: 0 % (ref 0.0–2.0)
Hgb S: 0 %

## 2023-04-30 LAB — HAPTOGLOBIN: Haptoglobin: 232 mg/dL (ref 33–346)

## 2023-05-01 ENCOUNTER — Ambulatory Visit (INDEPENDENT_AMBULATORY_CARE_PROVIDER_SITE_OTHER): Payer: Medicare Other

## 2023-05-01 ENCOUNTER — Encounter: Payer: Self-pay | Admitting: Hematology

## 2023-05-01 VITALS — BP 122/79 | HR 66 | Temp 98.2°F | Resp 18 | Ht 60.0 in | Wt 241.0 lb

## 2023-05-01 DIAGNOSIS — E860 Dehydration: Secondary | ICD-10-CM | POA: Diagnosis not present

## 2023-05-01 LAB — PROTEIN ELECTROPHORESIS, SERUM, WITH REFLEX
A/G Ratio: 1.2 (ref 0.7–1.7)
Albumin ELP: 3.9 g/dL (ref 2.9–4.4)
Alpha-1-Globulin: 0.2 g/dL (ref 0.0–0.4)
Alpha-2-Globulin: 0.8 g/dL (ref 0.4–1.0)
Beta Globulin: 1.2 g/dL (ref 0.7–1.3)
Gamma Globulin: 1.1 g/dL (ref 0.4–1.8)
Globulin, Total: 3.3 g/dL (ref 2.2–3.9)
Total Protein ELP: 7.2 g/dL (ref 6.0–8.5)

## 2023-05-01 LAB — HEAVY METALS, BLOOD
Arsenic: 5 ug/L (ref 0–9)
Lead: <1 ug/dL (ref 0.0–3.4)
Mercury: 1.1 ug/L (ref 0.0–14.9)

## 2023-05-01 MED ORDER — SODIUM CHLORIDE 0.9 % IV BOLUS
1000.0000 mL | Freq: Once | INTRAVENOUS | Status: AC
  Administered 2023-05-01: 1000 mL via INTRAVENOUS
  Filled 2023-05-01: qty 1000

## 2023-05-01 NOTE — Progress Notes (Signed)
Diagnosis: Dehydration  Provider:  Chilton Greathouse MD  Procedure: IV Infusion  IV Type: Peripheral, IV Location: L Antecubital  Normal Saline, Dose: 1000 ml  Infusion Start Time: 1159  Infusion Stop Time: 1411  Post Infusion IV Care: Patient declined observation and Peripheral IV Discontinued  Discharge: Condition: Good, Destination: Home . AVS Declined  Performed by:  Loney Hering, LPN

## 2023-05-02 ENCOUNTER — Ambulatory Visit: Payer: 59

## 2023-05-02 ENCOUNTER — Other Ambulatory Visit: Payer: Self-pay

## 2023-05-03 ENCOUNTER — Ambulatory Visit (HOSPITAL_COMMUNITY)
Admission: RE | Admit: 2023-05-03 | Discharge: 2023-05-03 | Disposition: A | Discharge status: Home / Self Care | Payer: Medicare Other | Source: Ambulatory Visit | Attending: Hematology | Admitting: Hematology

## 2023-05-03 ENCOUNTER — Inpatient Hospital Stay: Payer: Medicare Other

## 2023-05-03 DIAGNOSIS — N179 Acute kidney failure, unspecified: Secondary | ICD-10-CM | POA: Diagnosis not present

## 2023-05-03 DIAGNOSIS — D500 Iron deficiency anemia secondary to blood loss (chronic): Secondary | ICD-10-CM | POA: Diagnosis not present

## 2023-05-03 DIAGNOSIS — D649 Anemia, unspecified: Secondary | ICD-10-CM

## 2023-05-03 LAB — URINALYSIS, COMPLETE (UACMP) WITH MICROSCOPIC
Bilirubin Urine: NEGATIVE
Glucose, UA: NEGATIVE mg/dL
Ketones, ur: NEGATIVE mg/dL
Nitrite: NEGATIVE
Protein, ur: NEGATIVE mg/dL
Specific Gravity, Urine: 1.019 (ref 1.005–1.030)
pH: 5 (ref 5.0–8.0)

## 2023-05-06 ENCOUNTER — Telehealth (INDEPENDENT_AMBULATORY_CARE_PROVIDER_SITE_OTHER): Payer: Self-pay | Admitting: Family Medicine

## 2023-05-06 NOTE — Telephone Encounter (Signed)
09/16 Pt cld stating that she may have to cancel the 09/16 appt if she cannot get Transportation. [facility].

## 2023-05-08 ENCOUNTER — Ambulatory Visit: Payer: 59 | Admitting: Family Medicine

## 2023-05-08 ENCOUNTER — Telehealth: Payer: Self-pay

## 2023-05-08 ENCOUNTER — Inpatient Hospital Stay: Payer: Medicare Other

## 2023-05-08 ENCOUNTER — Telehealth: Payer: Self-pay | Admitting: Hematology

## 2023-05-08 NOTE — Telephone Encounter (Signed)
Pt called requesting if she qualifies for hospital transportation.  She spoke with both me and Jorene Guest regarding her transportation issues.  Pt stated she does not drive but needs to be able to get to her appts with Dr. Mosetta Putt.  Pt stated she had transportation before through her insurance when she had [employer] but now since she's with [employer] they do not offer transportation.  When I looked at her chart, it looks like she has Medicare Part A & B, [employer] - Medicare, and Managed Medicaid (?).  I'm not sure if any of these offer transportation to their subscribers; therefore, sent an email to the [facility] Transportation Coordinator - [employer].  Requested if Ephriam Knuckles would please contact the pt on her transportation options.  Pt is aware message sent to [facility] regarding transportation.  Pt is also aware of her telephone appt with Dr. Mosetta Putt on 05/09/2023.

## 2023-05-09 ENCOUNTER — Inpatient Hospital Stay: Payer: Medicare Other | Admitting: Hematology

## 2023-05-09 ENCOUNTER — Other Ambulatory Visit: Payer: Self-pay

## 2023-05-09 ENCOUNTER — Other Ambulatory Visit: Payer: Self-pay | Admitting: Nurse Practitioner

## 2023-05-09 DIAGNOSIS — G8929 Other chronic pain: Secondary | ICD-10-CM

## 2023-05-09 MED ORDER — DICLOFENAC SODIUM 75 MG PO TBEC
75.0000 mg | DELAYED_RELEASE_TABLET | Freq: Two times a day (BID) | ORAL | 0 refills | Status: DC
  Filled 2023-05-09: qty 30, 15d supply, fill #0

## 2023-05-13 ENCOUNTER — Other Ambulatory Visit: Payer: Self-pay

## 2023-05-14 ENCOUNTER — Inpatient Hospital Stay (HOSPITAL_BASED_OUTPATIENT_CLINIC_OR_DEPARTMENT_OTHER): Payer: Medicare Other | Admitting: Hematology

## 2023-05-14 ENCOUNTER — Encounter: Payer: Self-pay | Admitting: Hematology

## 2023-05-14 ENCOUNTER — Inpatient Hospital Stay: Payer: Medicare Other

## 2023-05-14 VITALS — BP 146/89 | HR 67 | Temp 98.0°F | Resp 18 | Ht 60.0 in | Wt 248.2 lb

## 2023-05-14 DIAGNOSIS — I890 Lymphedema, not elsewhere classified: Secondary | ICD-10-CM | POA: Diagnosis not present

## 2023-05-14 DIAGNOSIS — D500 Iron deficiency anemia secondary to blood loss (chronic): Secondary | ICD-10-CM | POA: Diagnosis not present

## 2023-05-14 DIAGNOSIS — D649 Anemia, unspecified: Secondary | ICD-10-CM

## 2023-05-14 LAB — CMP (CANCER CENTER ONLY)
ALT: 17 U/L (ref 0–44)
AST: 15 U/L (ref 15–41)
Albumin: 4.1 g/dL (ref 3.5–5.0)
Alkaline Phosphatase: 94 U/L (ref 38–126)
Anion gap: 4 — ABNORMAL LOW (ref 5–15)
BUN: 14 mg/dL (ref 6–20)
CO2: 28 mmol/L (ref 22–32)
Calcium: 9.9 mg/dL (ref 8.9–10.3)
Chloride: 110 mmol/L (ref 98–111)
Creatinine: 0.86 mg/dL (ref 0.44–1.00)
GFR, Estimated: >60 mL/min (ref >60)
Glucose, Bld: 100 mg/dL — ABNORMAL HIGH (ref 70–99)
Potassium: 4 mmol/L (ref 3.5–5.1)
Sodium: 142 mmol/L (ref 135–145)
Total Bilirubin: 0.5 mg/dL (ref 0.3–1.2)
Total Protein: 7 g/dL (ref 6.5–8.1)

## 2023-05-14 LAB — CBC WITH DIFFERENTIAL (CANCER CENTER ONLY)
Abs Immature Granulocytes: 0.01 10*3/uL (ref 0.00–0.07)
Basophils Absolute: 0 10*3/uL (ref 0.0–0.1)
Basophils Relative: 1 %
Eosinophils Absolute: 0.1 10*3/uL (ref 0.0–0.5)
Eosinophils Relative: 2 %
HCT: 28.9 % — ABNORMAL LOW (ref 36.0–46.0)
Hemoglobin: 8.9 g/dL — ABNORMAL LOW (ref 12.0–15.0)
Immature Granulocytes: 0 %
Lymphocytes Relative: 33 %
Lymphs Abs: 1.4 10*3/uL (ref 0.7–4.0)
MCH: 31.9 pg (ref 26.0–34.0)
MCHC: 30.8 g/dL (ref 30.0–36.0)
MCV: 103.6 fL — ABNORMAL HIGH (ref 80.0–100.0)
Monocytes Absolute: 0.3 10*3/uL (ref 0.1–1.0)
Monocytes Relative: 8 %
Neutro Abs: 2.4 10*3/uL (ref 1.7–7.7)
Neutrophils Relative %: 56 %
Platelet Count: 167 10*3/uL (ref 150–400)
RBC: 2.79 MIL/uL — ABNORMAL LOW (ref 3.87–5.11)
RDW: 14 % (ref 11.5–15.5)
WBC Count: 4.3 10*3/uL (ref 4.0–10.5)
nRBC: 0 % (ref 0.0–0.2)

## 2023-05-14 LAB — SAMPLE TO BLOOD BANK

## 2023-05-14 NOTE — Progress Notes (Signed)
[facility]   [phone] [phone]   Clinic Follow up Note   Patient Care Team: Ivonne Andrew, NP as PCP - General (Pulmonary Disease) [name], MD as PCP - Cardiology (Cardiology) Heidi Dach, RN as Case Manager Shaune Leeks as Social Worker Malachy Mood, MD as Consulting Physician (Hematology)  Date of Service:  05/14/2023  CHIEF COMPLAINT: f/u of anemia  Assessment and Plan    Anemia Chronic anemia since 2009, hemoglobin around 9 lately -I reviewed her recent lab work for anemia, which ruled out multiple myeloma, heavy metal toxicity, hepatitis, HIV, and sickle cell. Erythropoietin level normal, no evidence of hemolysis. Possible anemia of chronic disease due to kidney issues, but the primary pulmonary disease such as MDS is not ruled out. -I recommend a bone marrow biopsy, she agrees.  Chronic Knee Pain Severe pain due to bone-on-bone arthritis. Pain management is challenging due to kidney issues and weight gain. -Recommend discussing with primary care physician and orthopedic surgeon for alternative pain management strategies.  Weight Gain Gained eight pounds since last visit, which is impacting her ability to qualify for knee surgery. -Encourage continued focus on diet and exercise as tolerated.  AKI -she had Elevated creatinine on last visit, patient was given IV fluids and advised to stop NSAIDs. -Cr is back to normal today -Continue to monitor kidney function with primary care physician.   Follow-up -Bone marrow biopsy by interventional radiology in the next 2 to 3 weeks  -plan to call patient with bone marrow biopsy results -Encourage patient to schedule virtual visit with primary care physician to discuss pain management and other medical issues     Discussed the use of AI scribe software for clinical note transcription with the patient, who gave verbal consent to proceed.  History of Present Illness   The  patient, a 57 year old female with a history of anemia and arthritis, presents for a follow-up visit. She reports persistent knee pain due to arthritis, which is described as "bone on bone." The pain has been exacerbated since discontinuing certain pain medications on medical advice due to concerns about their impact on her kidney function. The patient is awaiting knee surgery, which has been delayed until she can achieve further weight loss. However, she reports gaining weight recently, which she attributes to her inability to exercise due to pain.  The patient's anemia has been a long-standing issue, dating back to at least 2009 when she underwent a hysterectomy. She believed her anemia had resolved following the surgery, but recent blood tests have shown a hemoglobin level of 9.2. The patient reports no new symptoms related to her anemia. She is concerned about her kidney function, as recent tests showed elevated creatinine levels.         All other systems were reviewed with the patient and are negative.  MEDICAL HISTORY:  Past Medical History:  Diagnosis Date   Anemia    Ascending aorta dilatation (HCC)    38mm by echo 07/2021 and 40mm by echo 07/2022   Asthma    Depression    no meds   Depression    Eczema    Fibroid    Headache(784.0)    otc prn med   History of blood transfusion    Hypertension    Knee pain, bilateral    arthralgia knee pain bilateral   Lower extremity edema    Mitral regurgitation    Mild to moderate by echo 07/2021   Seasonal  allergies    Sleep apnea    SVD (spontaneous vaginal delivery)    x 3   Vaginal polyp 05/2020    SURGICAL HISTORY: Past Surgical History:  Procedure Laterality Date   ABDOMINAL HYSTERECTOMY     BILATERAL SALPINGECTOMY Bilateral 10/10/2017   Procedure: BILATERAL SALPINGECTOMY;  Surgeon: Hermina Staggers, MD;  Location: [facility];  Service: Gynecology;  Laterality: Bilateral;   BREAST BIOPSY     US guided core 2009   BREAST  EXCISIONAL BIOPSY     right 1982 left 1981   BREAST SURGERY     bilateral cysts/benign   COLONOSCOPY  10/17/2021   VAGINAL HYSTERECTOMY N/A 10/10/2017   Procedure: HYSTERECTOMY VAGINAL;  Surgeon: Hermina Staggers, MD;  Location: [facility];  Service: Gynecology;  Laterality: N/A;    I have reviewed the social history and family history with the patient and they are unchanged from previous note.  ALLERGIES:  has No Known Allergies.  MEDICATIONS:  Current Outpatient Medications  Medication Sig Dispense Refill   acetaminophen (TYLENOL) 325 MG tablet Take 650 mg by mouth daily as needed for moderate pain, headache or fever.     albuterol (VENTOLIN HFA) 108 (90 Base) MCG/ACT inhaler Inhale 1 puff every 6 hours as needed for shortness of breath 18 g 5   aspirin 81 MG chewable tablet Chew by mouth daily.     buPROPion (WELLBUTRIN XL) 150 MG 24 hr tablet Take 1 tablet (150 mg total) by mouth daily. 30 tablet 3   carvedilol (COREG) 25 MG tablet Take 1 tablet (25 mg total) by mouth 2 (two) times daily. 180 tablet 3   cetirizine (ZYRTEC) 10 MG tablet Take 1 tablet (10 mg total) by mouth daily. 30 tablet 6   ciclopirox (LOPROX) 0.77 % cream Apply topically 2 (two) times daily. 15 g 0   cyclobenzaprine (FLEXERIL) 5 MG tablet Take 1 tablet (5 mg total) by mouth 3 (three) times daily as needed for muscle spasms. 30 tablet 0   diclofenac (VOLTAREN) 75 MG EC tablet Take 1 tablet (75 mg total) by mouth 2 (two) times daily. 30 tablet 0   diclofenac Sodium (VOLTAREN) 1 % GEL Apply 4 g topically 4 (four) times daily. 100 g 3   docusate sodium (COLACE) 100 MG capsule Take 1 capsule (100 mg total) by mouth 2 (two) times daily as needed. 30 capsule 2   ferrous gluconate (FERGON) 324 MG tablet Take 1 tablet (324 mg total) by mouth 2 (two) times daily with a meal. 60 tablet 0   fluticasone (FLONASE) 50 MCG/ACT nasal spray Place 1 spray into both nostrils daily. (Patient taking differently: Place 1 spray into both  nostrils daily as needed for allergies.) 16 g 2   furosemide (LASIX) 20 MG tablet Take 1 tablet (20 mg total) by mouth daily. 30 tablet 6   hydrocortisone 2.5 % cream Apply topically 2 (two) times daily. (Patient taking differently: Apply 1 Application topically daily as needed (itching).) 30 g 1   hydrOXYzine (ATARAX) 25 MG tablet Take 1 tablet (25 mg total) by mouth 3 (three) times daily as needed. 30 tablet 11   ibuprofen (ADVIL) 800 MG tablet Take 1 tablet (800 mg total) by mouth 3 (three) times daily. 30 tablet 6   ipratropium-albuterol (DUONEB) 0.5-2.5 (3) MG/3ML SOLN Take 3 mLs by nebulization every 6 (six) hours as needed. 360 mL 0   linaclotide (LINZESS) 72 MCG capsule Take 1 capsule (72 mcg total) by mouth daily before breakfast.  30 capsule 0   meloxicam (MOBIC) 15 MG tablet Take 1 tablet (15 mg total) by mouth daily as needed for pain. 90 tablet 3   methocarbamol (ROBAXIN-750) 750 MG tablet Take 1 tablet (750 mg total) by mouth 4 (four) times daily. (Patient taking differently: Take 750 mg by mouth every 6 (six) hours as needed for muscle spasms.) 120 tablet 3   nitroGLYCERIN (NITROSTAT) 0.4 MG SL tablet Place 1 tablet (0.4 mg total) under the tongue every 5 (five) minutes as needed for chest pain (max 3 doses, if no relief call 911). 25 tablet 5   NONFORMULARY OR COMPOUNDED ITEM Diclofenac 3%, Gabapentin 5%, Lidocaine 5%, Menthol 1%.  Apply 1-2 pumps three to four times per day as needed 1 each 0   olmesartan (BENICAR) 40 MG tablet Take 1 tablet (40 mg total) by mouth daily. 90 tablet 3   Respiratory Therapy Supplies (NEBULIZER MASK ADULT) MISC Use this mask with your nebulizer. 1 each 0   spironolactone (ALDACTONE) 100 MG tablet Take 1 tablet (100 mg total) by mouth daily. 90 tablet 3   triamcinolone cream (KENALOG) 0.5 % Apply 1 Application topically 3 (three) times daily. 30 g 0   Vitamin D, Ergocalciferol, (DRISDOL) 1.25 MG (50000 UNIT) CAPS capsule Take 1 capsule (50,000 Units total)  by mouth every 7 (seven) days. 12 capsule 0   Current Facility-Administered Medications  Medication Dose Route Frequency Provider Last Rate Last Admin   Triamcinolone Acetonide (ZILRETTA) intra-articular injection 32 mg  32 mg Intra-articular Once Raulkar, Drema Pry, MD       Triamcinolone Acetonide (ZILRETTA) intra-articular injection 32 mg  32 mg Intra-articular Once Raulkar, Drema Pry, MD        PHYSICAL EXAMINATION: ECOG PERFORMANCE STATUS: 2 - Symptomatic, <50% confined to bed  Vitals:   05/14/23 0945  BP: (!) 146/89  Pulse: 67  Resp: 18  Temp: 98 F (36.7 C)  SpO2: 100%   Wt Readings from Last 3 Encounters:  05/14/23 248 lb 3.2 oz (112.6 kg)  05/01/23 241 lb (109.3 kg)  04/27/23 240 lb 12.8 oz (109.2 kg)     GENERAL:alert, no distress and comfortable SKIN: skin color, texture, turgor are normal, no rashes or significant lesions EYES: normal, Conjunctiva are pink and non-injected, sclera clear NECK: supple, thyroid normal size, non-tender, without nodularity LYMPH:  no palpable lymphadenopathy in the cervical, axillary  LUNGS: clear to auscultation and percussion with normal breathing effort HEART: regular rate & rhythm and no murmurs and no lower extremity edema ABDOMEN:abdomen soft, non-tender and normal bowel sounds Musculoskeletal:no cyanosis of digits and no clubbing  NEURO: alert & oriented x 3 with fluent speech, no focal motor/sensory deficits  Physical Exam   MEASUREMENTS: Weight increased by eight pounds since September 7th.      LABORATORY DATA:  I have reviewed the data as listed    Latest Ref Rng & Units 05/14/2023    9:14 AM 04/27/2023   10:11 AM 02/27/2023    9:24 AM  CBC  WBC 4.0 - 10.5 K/uL 4.3  5.4  4.7   Hemoglobin 12.0 - 15.0 g/dL 8.9  9.2  8.7   Hematocrit 36.0 - 46.0 % 28.9  29.4  27.4   Platelets 150 - 400 K/uL 167  164  137         Latest Ref Rng & Units 05/14/2023    9:14 AM 04/27/2023   10:11 AM 02/27/2023    9:24 AM  CMP  Glucose  70 -  99 mg/dL 161  87  83   BUN 6 - 20 mg/dL 14  37  24   Creatinine 0.44 - 1.00 mg/dL 0.96  0.45  4.09   Sodium 135 - 145 mmol/L 142  140  143   Potassium 3.5 - 5.1 mmol/L 4.0  4.4  4.1   Chloride 98 - 111 mmol/L 110  107  106   CO2 22 - 32 mmol/L 28  26  21    Calcium 8.9 - 10.3 mg/dL 9.9  81.1  91.4   Total Protein 6.5 - 8.1 g/dL 7.0  7.7  6.8   Total Bilirubin 0.3 - 1.2 mg/dL 0.5  0.5  0.3   Alkaline Phos 38 - 126 U/L 94  93  106   AST 15 - 41 U/L 15  12  11    ALT 0 - 44 U/L 17  13  16        RADIOGRAPHIC STUDIES: I have personally reviewed the radiological images as listed and agreed with the findings in the report. No results found.    Orders Placed This Encounter  Procedures   CT BONE MARROW BIOPSY & ASPIRATION    Standing Status:   Future    Standing Expiration Date:   05/13/2024    Order Specific Question:   Reason for Exam (SYMPTOM  OR DIAGNOSIS REQUIRED)    Answer:   ANEMIA    Order Specific Question:   Is patient pregnant?    Answer:   No    Order Specific Question:   Preferred location?    Answer:   [facility]   All questions were answered. The patient knows to call the clinic with any problems, questions or concerns. No barriers to learning was detected. The total time spent in the appointment was 30 minutes.     Malachy Mood, MD 05/14/2023

## 2023-05-15 ENCOUNTER — Ambulatory Visit: Payer: 59

## 2023-05-15 ENCOUNTER — Ambulatory Visit: Payer: Self-pay | Admitting: Nurse Practitioner

## 2023-05-15 ENCOUNTER — Telehealth: Payer: Self-pay | Admitting: Hematology

## 2023-05-21 ENCOUNTER — Other Ambulatory Visit: Payer: Self-pay

## 2023-05-21 ENCOUNTER — Other Ambulatory Visit: Payer: Self-pay | Admitting: Nurse Practitioner

## 2023-05-21 NOTE — Telephone Encounter (Signed)
Please advise KH 

## 2023-05-21 NOTE — Telephone Encounter (Signed)
Please advise Kh 

## 2023-05-22 ENCOUNTER — Ambulatory Visit: Payer: Medicare Other | Admitting: Family Medicine

## 2023-05-22 ENCOUNTER — Other Ambulatory Visit: Payer: Self-pay

## 2023-05-22 MED ORDER — TRIAMCINOLONE ACETONIDE 0.5 % EX CREA
1.0000 | TOPICAL_CREAM | Freq: Three times a day (TID) | CUTANEOUS | 0 refills | Status: DC
  Filled 2023-05-22: qty 30, 10d supply, fill #0

## 2023-05-22 MED ORDER — ASPIRIN 81 MG PO CHEW
81.0000 mg | CHEWABLE_TABLET | Freq: Every day | ORAL | 2 refills | Status: DC
Start: 2023-05-22 — End: 2023-09-16
  Filled 2023-05-22: qty 30, 30d supply, fill #0
  Filled 2023-06-26: qty 30, 30d supply, fill #1
  Filled 2023-07-03 – 2023-08-12 (×2): qty 30, 30d supply, fill #2

## 2023-05-24 ENCOUNTER — Other Ambulatory Visit: Payer: Self-pay

## 2023-05-24 ENCOUNTER — Other Ambulatory Visit: Payer: Self-pay | Admitting: Nurse Practitioner

## 2023-05-24 MED ORDER — CYCLOBENZAPRINE HCL 5 MG PO TABS
5.0000 mg | ORAL_TABLET | Freq: Three times a day (TID) | ORAL | 0 refills | Status: DC | PRN
  Filled 2023-05-24: qty 30, 10d supply, fill #0

## 2023-05-24 NOTE — Telephone Encounter (Signed)
Please advise Kh 

## 2023-05-27 ENCOUNTER — Other Ambulatory Visit: Payer: Self-pay

## 2023-05-27 ENCOUNTER — Encounter: Payer: Medicare Other | Admitting: Physical Medicine and Rehabilitation

## 2023-05-28 ENCOUNTER — Ambulatory Visit (HOSPITAL_COMMUNITY): Payer: 59 | Admitting: Mental Health

## 2023-05-30 ENCOUNTER — Ambulatory Visit
Admission: RE | Admit: 2023-05-30 | Discharge: 2023-05-30 | Disposition: A | Discharge status: Home / Self Care | Payer: Medicare Other | Source: Ambulatory Visit | Attending: Nurse Practitioner | Admitting: Nurse Practitioner

## 2023-05-30 DIAGNOSIS — Z0000 Encounter for general adult medical examination without abnormal findings: Secondary | ICD-10-CM

## 2023-05-31 ENCOUNTER — Ambulatory Visit: Payer: Self-pay | Admitting: Nurse Practitioner

## 2023-06-03 ENCOUNTER — Ambulatory Visit: Payer: Self-pay | Admitting: Nurse Practitioner

## 2023-06-04 ENCOUNTER — Other Ambulatory Visit: Payer: Self-pay

## 2023-06-04 NOTE — Telephone Encounter (Signed)
Please advise KH 

## 2023-06-05 ENCOUNTER — Other Ambulatory Visit: Payer: Self-pay

## 2023-06-05 ENCOUNTER — Other Ambulatory Visit: Payer: Self-pay | Admitting: Radiology

## 2023-06-05 DIAGNOSIS — D649 Anemia, unspecified: Secondary | ICD-10-CM

## 2023-06-05 MED ORDER — ACETAMINOPHEN 325 MG PO TABS
650.0000 mg | ORAL_TABLET | Freq: Every day | ORAL | 0 refills | Status: DC | PRN
  Filled 2023-06-05: qty 100, 50d supply, fill #0

## 2023-06-05 NOTE — Consult Note (Signed)
Chief Complaint: Patient was seen in consultation today for CT guided bone marrow biopsy  Referring Physician(s): Feng,Yan  Supervising Physician: Marliss Coots  Patient Status: [name]  History of Present Illness: Caroline Ryan is a 57 y.o. female with PMH sig for asthma, depression, eczema, fibroids, HTN, bilat knee pain, MR, sleep apnea, and chronic anemia of uncertain etiology. She is scheduled today for CT guided bone marrow biopsy to r/o MDS.   Past Medical History:  Diagnosis Date   Anemia    Ascending aorta dilatation (HCC)    38mm by echo 07/2021 and 40mm by echo 07/2022   Asthma    Depression    no meds   Depression    Eczema    Fibroid    Headache(784.0)    otc prn med   History of blood transfusion    Hypertension    Knee pain, bilateral    arthralgia knee pain bilateral   Lower extremity edema    Mitral regurgitation    Mild to moderate by echo 07/2021   Seasonal allergies    Sleep apnea    SVD (spontaneous vaginal delivery)    x 3   Vaginal polyp 05/2020    Past Surgical History:  Procedure Laterality Date   ABDOMINAL HYSTERECTOMY     BILATERAL SALPINGECTOMY Bilateral 10/10/2017   Procedure: BILATERAL SALPINGECTOMY;  Surgeon: Hermina Staggers, MD;  Location: [facility];  Service: Gynecology;  Laterality: Bilateral;   BREAST BIOPSY     US guided core 2009   BREAST EXCISIONAL BIOPSY     right 1982 left 1981   BREAST SURGERY     bilateral cysts/benign   COLONOSCOPY  10/17/2021   VAGINAL HYSTERECTOMY N/A 10/10/2017   Procedure: HYSTERECTOMY VAGINAL;  Surgeon: Hermina Staggers, MD;  Location: [facility];  Service: Gynecology;  Laterality: N/A;    Allergies: Patient has no known allergies.  Medications: Prior to Admission medications   Medication Sig Start Date End Date Taking? Authorizing Provider  acetaminophen (TYLENOL) 325 MG tablet Take 2 tablets (650 mg total) by mouth daily as needed for moderate pain (pain score 4-6), headache  or fever. 06/05/23   Ivonne Andrew, NP  albuterol (VENTOLIN HFA) 108 (90 Base) MCG/ACT inhaler Inhale 1 puff every 6 hours as needed for shortness of breath 09/21/22   Ivonne Andrew, NP  aspirin 81 MG chewable tablet Chew 1 tablet (81 mg total) by mouth daily. 05/22/23   Ivonne Andrew, NP  buPROPion (WELLBUTRIN XL) 150 MG 24 hr tablet Take 1 tablet (150 mg total) by mouth daily. 03/28/23 07/26/23  Augusto Gamble, MD  carvedilol (COREG) 25 MG tablet Take 1 tablet (25 mg total) by mouth 2 (two) times daily. 01/18/23   Ivonne Andrew, NP  cetirizine (ZYRTEC) 10 MG tablet Take 1 tablet (10 mg total) by mouth daily. 11/30/22   Ivonne Andrew, NP  ciclopirox (LOPROX) 0.77 % cream Apply topically 2 (two) times daily. 01/23/23   Ivonne Andrew, NP  cyclobenzaprine (FLEXERIL) 5 MG tablet Take 1 tablet (5 mg total) by mouth 3 (three) times daily as needed for muscle spasms. 05/24/23   Ivonne Andrew, NP  diclofenac (VOLTAREN) 75 MG EC tablet Take 1 tablet (75 mg total) by mouth 2 (two) times daily. 05/09/23   Ivonne Andrew, NP  diclofenac Sodium (VOLTAREN) 1 % GEL Apply 4 g topically 4 (four) times daily. 09/15/21   [name], NP  docusate sodium (COLACE)  100 MG capsule Take 1 capsule (100 mg total) by mouth 2 (two) times daily as needed. 09/08/21   [name], NP  ferrous gluconate (FERGON) 324 MG tablet Take 1 tablet (324 mg total) by mouth 2 (two) times daily with a meal. 04/18/23   Bowen, Scot Jun, DO  fluticasone (FLONASE) 50 MCG/ACT nasal spray Place 1 spray into both nostrils daily. Patient taking differently: Place 1 spray into both nostrils daily as needed for allergies. 10/09/22   Raulkar, Drema Pry, MD  furosemide (LASIX) 20 MG tablet Take 1 tablet (20 mg total) by mouth daily. 03/21/21 04/04/23  [name], MD  hydrocortisone 2.5 % cream Apply topically 2 (two) times daily. Patient taking differently: Apply 1 Application topically daily as needed (itching). 01/22/22   Passmore,  Enid Derry I, NP  hydrOXYzine (ATARAX) 25 MG tablet Take 1 tablet (25 mg total) by mouth 3 (three) times daily as needed. 01/23/23   Ivonne Andrew, NP  ibuprofen (ADVIL) 800 MG tablet Take 1 tablet (800 mg total) by mouth 3 (three) times daily. 12/18/21   Raulkar, Drema Pry, MD  ipratropium-albuterol (DUONEB) 0.5-2.5 (3) MG/3ML SOLN Take 3 mLs by nebulization every 6 (six) hours as needed. 10/23/22   Jeanelle Malling, PA  linaclotide [facility]) 72 MCG capsule Take 1 capsule (72 mcg total) by mouth daily before breakfast. 11/23/21   Passmore, Enid Derry I, NP  meloxicam (MOBIC) 15 MG tablet Take 1 tablet (15 mg total) by mouth daily as needed for pain. 04/26/23   Raulkar, Drema Pry, MD  methocarbamol (ROBAXIN-750) 750 MG tablet Take 1 tablet (750 mg total) by mouth 4 (four) times daily. Patient taking differently: Take 750 mg by mouth every 6 (six) hours as needed for muscle spasms. 07/16/22   Raulkar, Drema Pry, MD  nitroGLYCERIN (NITROSTAT) 0.4 MG SL tablet Place 1 tablet (0.4 mg total) under the tongue every 5 (five) minutes as needed for chest pain (max 3 doses, if no relief call 911). 09/10/22   Sharlene Dory, PA-C  NONFORMULARY OR COMPOUNDED ITEM Diclofenac 3%, Gabapentin 5%, Lidocaine 5%, Menthol 1%.  Apply 1-2 pumps three to four times per day as needed 06/09/20   Raulkar, Drema Pry, MD  olmesartan (BENICAR) 40 MG tablet Take 1 tablet (40 mg total) by mouth daily. 10/15/22   [name], MD  Respiratory Therapy Supplies (NEBULIZER MASK ADULT) MISC Use this mask with your nebulizer. 10/23/22   Jeanelle Malling, PA  spironolactone (ALDACTONE) 100 MG tablet Take 1 tablet (100 mg total) by mouth daily. 01/18/23   Ivonne Andrew, NP  triamcinolone cream (KENALOG) 0.5 % Apply 1 Application topically 3 (three) times daily. 05/22/23   Ivonne Andrew, NP  Vitamin D, Ergocalciferol, (DRISDOL) 1.25 MG (50000 UNIT) CAPS capsule Take 1 capsule (50,000 Units total) by mouth every 7 (seven) days. 04/26/23   Raulkar, Drema Pry, MD      Family History  Problem Relation Age of Onset   Breast cancer Mother 29   Diabetes Father    Hypertension Father    Diabetes Brother    Diabetes Brother    Other Neg Hx    Colon polyps Neg Hx    Colon cancer Neg Hx    Esophageal cancer Neg Hx    Stomach cancer Neg Hx    Rectal cancer Neg Hx     Social History   Socioeconomic History   Marital status: Single    Spouse name: Not on file   Number  of children: 3   Years of education: Not on file   Highest education level: Not on file  Occupational History   Not on file  Tobacco Use   Smoking status: Former    Current packs/day: 0.00    Average packs/day: 0.3 packs/day for 2.0 years (0.5 ttl pk-yrs)    Types: Cigarettes    Start date: 08/20/2012    Quit date: 08/20/2014    Years since quitting: 8.7   Smokeless tobacco: Never  Vaping Use   Vaping status: Never Used  Substance and Sexual Activity   Alcohol use: Yes    Alcohol/week: 2.0 standard drinks of alcohol    Types: 2 Glasses of wine per week    Comment: once a week   Drug use: No   Sexual activity: Yes    Birth control/protection: Condom  Other Topics Concern   Not on file  Social History Narrative   Not on file   Social Determinants of Health   Financial Resource Strain: High Risk (08/21/2022)   Overall Financial Resource Strain (CARDIA)    Difficulty of Paying Living Expenses: Very hard  Food Insecurity: Food Insecurity Present (08/21/2022)   Hunger Vital Sign    Worried About Running Out of Food in the Last Year: Often true    Ran Out of Food in the Last Year: Often true  Transportation Needs: No Transportation Needs (08/21/2022)   PRAPARE - Administrator, Civil Service (Medical): No    Lack of Transportation (Non-Medical): No  Physical Activity: Inactive (08/21/2022)   Exercise Vital Sign    Days of Exercise per Week: 0 days    Minutes of Exercise per Session: 0 min  Stress: Stress Concern Present (07/27/2022)   [employer] of  Occupational Health - Occupational Stress Questionnaire    Feeling of Stress : Very much  Social Connections: Socially Isolated (08/21/2022)   Social Connection and Isolation Panel [NHANES]    Frequency of Communication with Friends and Family: More than three times a week    Frequency of Social Gatherings with Friends and Family: Never    Attends Religious Services: Never    Database administrator or Organizations: No    Attends Engineer, structural: Never    Marital Status: Never married      Review of Systems: denies fever,HA,CP, dyspnea, cough, abd/back pain,N/V or bleeding  Vital Signs: Vitals:   06/06/23 0827  Resp: 17  Temp: 98.2 F (36.8 C)    LMP 04/03/2017 (Approximate)   Code Status: FULL CODE  Physical Exam: awake/alert; chest- CTA bilat; heart- RRR; abd-obese, soft,+BS,NT; 1+ pretibial edema bilat  Imaging: MM 3D SCREENING MAMMOGRAM BILATERAL BREAST  Result Date: 06/03/2023 CLINICAL DATA:  Screening. EXAM: DIGITAL SCREENING BILATERAL MAMMOGRAM WITH TOMOSYNTHESIS AND CAD TECHNIQUE: Bilateral screening digital craniocaudal and mediolateral oblique mammograms were obtained. Bilateral screening digital breast tomosynthesis was performed. The images were evaluated with computer-aided detection. COMPARISON:  Previous exam(s). ACR Breast Density Category b: There are scattered areas of fibroglandular density. FINDINGS: There are no findings suspicious for malignancy. IMPRESSION: No mammographic evidence of malignancy. A result letter of this screening mammogram will be mailed directly to the patient. RECOMMENDATION: Screening mammogram in one year. (Code:SM-B-01Y) BI-RADS CATEGORY  1: Negative. Electronically Signed   By: Elberta Fortis M.D.   On: 06/03/2023 11:26    Labs:  CBC: Recent Labs    10/23/22 1119 02/27/23 0924 04/27/23 1011 05/14/23 0914  WBC 10.9* 4.7 5.4 4.3  HGB  9.5* 8.7* 9.2* 8.9*  HCT 32.2* 27.4* 29.4* 28.9*  PLT 141* 137* 164 167     COAGS: No results for input(s): "INR", "APTT" in the last 8760 hours.  BMP: Recent Labs    10/23/22 1311 02/27/23 0924 04/27/23 1011 05/14/23 0914  NA 135 143 140 142  K 4.2 4.1 4.4 4.0  CL 104 106 107 110  CO2 22 21 26 28   GLUCOSE 150* 83 87 100*  BUN 19 24 37* 14  CALCIUM 9.2 10.0 10.1 9.9  CREATININE 0.85 1.29* 2.12* 0.86  GFRNONAA >60  --  26* >60    LIVER FUNCTION TESTS: Recent Labs    02/27/23 0924 04/27/23 1011 05/14/23 0914  BILITOT 0.3 0.5 0.5  AST 11 12* 15  ALT 16 13 17   ALKPHOS 106 93 94  PROT 6.8 7.7 7.0  ALBUMIN 4.1 4.3 4.1    TUMOR MARKERS: No results for input(s): "AFPTM", "CEA", "CA199", "CHROMGRNA" in the last 8760 hours.  Assessment and Plan: 57 y.o. female with PMH sig for asthma, depression, eczema, fibroids, HTN, bilat knee pain, MR, sleep apnea, and chronic anemia of uncertain etiology. She is scheduled today for CT guided bone marrow biopsy to r/o MDS. Risks and benefits of procedure was discussed with the patient  including, but not limited to bleeding, infection, damage to adjacent structures or low yield requiring additional tests.  All of the questions were answered and there is agreement to proceed.  Consent signed and in chart.    Thank you for this interesting consult.  I greatly enjoyed meeting Caroline Ryan and look forward to participating in their care.  A copy of this report was sent to the requesting provider on this date.  Electronically Signed: D. Jeananne Rama, PA-C 06/05/2023, 5:56 PM   I spent a total of   20 minutes  in face to face in clinical consultation, greater than 50% of which was counseling/coordinating care for CT guided bone marrow biopsy

## 2023-06-06 ENCOUNTER — Encounter (HOSPITAL_COMMUNITY): Payer: Self-pay

## 2023-06-06 ENCOUNTER — Other Ambulatory Visit: Payer: Self-pay

## 2023-06-06 ENCOUNTER — Ambulatory Visit (HOSPITAL_COMMUNITY)
Admission: RE | Admit: 2023-06-06 | Discharge: 2023-06-06 | Disposition: A | Discharge status: Home / Self Care | Payer: Medicare Other | Source: Ambulatory Visit | Attending: Hematology | Admitting: Hematology

## 2023-06-06 DIAGNOSIS — D539 Nutritional anemia, unspecified: Secondary | ICD-10-CM | POA: Diagnosis not present

## 2023-06-06 DIAGNOSIS — D649 Anemia, unspecified: Secondary | ICD-10-CM

## 2023-06-06 LAB — CBC WITH DIFFERENTIAL/PLATELET
Abs Immature Granulocytes: 0.01 10*3/uL (ref 0.00–0.07)
Basophils Absolute: 0 10*3/uL (ref 0.0–0.1)
Basophils Relative: 1 %
Eosinophils Absolute: 0.2 10*3/uL (ref 0.0–0.5)
Eosinophils Relative: 3 %
HCT: 32.5 % — ABNORMAL LOW (ref 36.0–46.0)
Hemoglobin: 9.8 g/dL — ABNORMAL LOW (ref 12.0–15.0)
Immature Granulocytes: 0 %
Lymphocytes Relative: 30 %
Lymphs Abs: 1.7 10*3/uL (ref 0.7–4.0)
MCH: 31.5 pg (ref 26.0–34.0)
MCHC: 30.2 g/dL (ref 30.0–36.0)
MCV: 104.5 fL — ABNORMAL HIGH (ref 80.0–100.0)
Monocytes Absolute: 0.5 10*3/uL (ref 0.1–1.0)
Monocytes Relative: 8 %
Neutro Abs: 3.4 10*3/uL (ref 1.7–7.7)
Neutrophils Relative %: 58 %
Platelets: 143 10*3/uL — ABNORMAL LOW (ref 150–400)
RBC: 3.11 MIL/uL — ABNORMAL LOW (ref 3.87–5.11)
RDW: 13.6 % (ref 11.5–15.5)
WBC: 5.8 10*3/uL (ref 4.0–10.5)
nRBC: 0 % (ref 0.0–0.2)

## 2023-06-06 MED ORDER — SODIUM CHLORIDE 0.9 % IV SOLN: INTRAVENOUS | Status: DC

## 2023-06-06 MED ORDER — SODIUM CHLORIDE 0.9% FLUSH: 10.0000 mL | Freq: Two times a day (BID) | INTRAVENOUS | Status: DC

## 2023-06-06 MED ORDER — FENTANYL CITRATE (PF) 100 MCG/2ML IJ SOLN
INTRAMUSCULAR | Status: AC | PRN
Start: 2023-06-06 — End: 2023-06-06
  Administered 2023-06-06: 25 ug via INTRAVENOUS
  Administered 2023-06-06: 50 ug via INTRAVENOUS
  Administered 2023-06-06: 25 ug via INTRAVENOUS

## 2023-06-06 MED ORDER — MIDAZOLAM HCL 2 MG/2ML IJ SOLN
INTRAMUSCULAR | Status: AC
  Filled 2023-06-06: qty 2

## 2023-06-06 MED ORDER — FENTANYL CITRATE (PF) 100 MCG/2ML IJ SOLN
INTRAMUSCULAR | Status: AC
  Filled 2023-06-06: qty 2

## 2023-06-06 MED ORDER — MIDAZOLAM HCL 2 MG/2ML IJ SOLN
INTRAMUSCULAR | Status: AC | PRN
Start: 2023-06-06 — End: 2023-06-06
  Administered 2023-06-06: .5 mg via INTRAVENOUS
  Administered 2023-06-06: 1 mg via INTRAVENOUS
  Administered 2023-06-06: .5 mg via INTRAVENOUS

## 2023-06-06 NOTE — Discharge Instructions (Signed)
Please call Interventional Radiology clinic [phone] with any questions or concerns.  You may remove your dressing and shower tomorrow.  After the procedure, it is common to have: Soreness Bruising Mild pain  Follow these instructions at home:  Medication: Do not use Aspirin or ibuprofen products, such as Advil or Motrin, as it may increase bleeding You may resume your usual medications as ordered by your doctor If your doctor prescribed antibiotics, take them as directed. Do not stop taking them just because you feel better. You need to take the full course of antibiotics  Eating and drinking: Drink plenty of liquids to keep your urine pale yellow You can resume your regular diet as directed by your doctor   Care of the procedure site  Check your biopsy site every day until it heals  Keep the bandage dry. You can take the bandage off and shower tomorrow  If you are bleeding from the biopsy site, apply firm pressure on the area for at least 30 minutes  If directed, apply ice to your biopsy site 2-3 times a day.  o Put ice in a plastic bag  o Place a towel between your skin and the ice bag  o Leave the ice in place for 20 minutes 2-3 times a day  Activity Do not take baths, swim, or use a hot tub until your health care provider approves  Keep all follow-up visits as told by your doctor  Contact your doctor or seek immediate medical care if: You have bright red bleeding from the biopsy site that does not stop after 30 minutes of holding direct pressure on the site. You have signs of infection, such as: Increased pain, swelling, warmth, or redness at the biopsy site Red streaks leading from the biopsy site Yellow or green drainage from the biopsy site A fever (temperature over 100.59F) chills, or both    Moderate Conscious Sedation-Care After  This sheet gives you information about how to care for yourself after your procedure. Your health care provider may also give you  more specific instructions. If you have problems or questions, contact your health care provider.  After the procedure, it is common to have: Sleepiness for several hours. Impaired judgment for several hours. Difficulty with balance. Vomiting if you eat too soon.  Follow these instructions at home:  Rest. Do not participate in activities where you could fall or become injured. Do not drive or use machinery. Do not drink alcohol. Do not take sleeping pills or medicines that cause drowsiness. Do not make important decisions or sign legal documents. Do not take care of children on your own.  Eating and drinking Follow the diet recommended by your health care provider. Drink enough fluid to keep your urine pale yellow. If you vomit: Drink water, juice, or soup when you can drink without vomiting. Make sure you have little or no nausea before eating solid foods.  General instructions Take over-the-counter and prescription medicines only as told by your health care provider. Have a responsible adult stay with you for the time you are told. It is important to have someone help care for you until you are awake and alert. Do not smoke. Keep all follow-up visits as told by your health care provider. This is important.  Contact a health care provider if: You are still sleepy or having trouble with balance after 24 hours. You feel light-headed. You keep feeling nauseous or you keep vomiting. You develop a rash. You have a fever. You  have redness or swelling around the IV site.  Get help right away if: You have trouble breathing. You have new-onset confusion at home.  This information is not intended to replace advice given to you by your health care provider. Make sure you discuss any questions you have with your healthcare provider.

## 2023-06-06 NOTE — Procedures (Signed)
Interventional Radiology Procedure Note  Procedure: CT guided aspirate and core biopsy of right iliac bone  Complications: None  Recommendations: - Bedrest supine x 1 hrs - Hydrocodone PRN  Pain - Follow biopsy results   Marliss Coots, MD

## 2023-06-10 ENCOUNTER — Other Ambulatory Visit: Payer: Self-pay

## 2023-06-10 LAB — SURGICAL PATHOLOGY

## 2023-06-13 ENCOUNTER — Encounter (HOSPITAL_COMMUNITY): Payer: Self-pay

## 2023-06-13 ENCOUNTER — Inpatient Hospital Stay: Payer: Medicare Other | Attending: Hematology | Admitting: Hematology

## 2023-06-13 DIAGNOSIS — I890 Lymphedema, not elsewhere classified: Secondary | ICD-10-CM | POA: Diagnosis not present

## 2023-06-13 DIAGNOSIS — D631 Anemia in chronic kidney disease: Secondary | ICD-10-CM

## 2023-06-13 DIAGNOSIS — N1831 Chronic kidney disease, stage 3a: Secondary | ICD-10-CM

## 2023-06-14 ENCOUNTER — Other Ambulatory Visit: Payer: Self-pay

## 2023-06-14 ENCOUNTER — Encounter: Payer: Self-pay | Admitting: Nurse Practitioner

## 2023-06-14 ENCOUNTER — Telehealth: Payer: Self-pay | Admitting: Hematology

## 2023-06-14 ENCOUNTER — Ambulatory Visit (INDEPENDENT_AMBULATORY_CARE_PROVIDER_SITE_OTHER): Payer: Medicare Other | Admitting: Nurse Practitioner

## 2023-06-14 VITALS — BP 144/76 | HR 64 | Temp 98.7°F | Ht 61.0 in | Wt 245.2 lb

## 2023-06-14 DIAGNOSIS — K5909 Other constipation: Secondary | ICD-10-CM | POA: Diagnosis not present

## 2023-06-14 DIAGNOSIS — M25562 Pain in left knee: Secondary | ICD-10-CM | POA: Diagnosis not present

## 2023-06-14 DIAGNOSIS — M25561 Pain in right knee: Secondary | ICD-10-CM

## 2023-06-14 DIAGNOSIS — J4520 Mild intermittent asthma, uncomplicated: Secondary | ICD-10-CM

## 2023-06-14 DIAGNOSIS — G8929 Other chronic pain: Secondary | ICD-10-CM

## 2023-06-14 MED ORDER — KETOROLAC TROMETHAMINE 30 MG/ML IJ SOLN
30.0000 mg | Freq: Once | INTRAMUSCULAR | Status: DC
Start: 2023-06-14 — End: 2023-06-14

## 2023-06-14 MED ORDER — ALBUTEROL SULFATE HFA 108 (90 BASE) MCG/ACT IN AERS
INHALATION_SPRAY | RESPIRATORY_TRACT | 5 refills | Status: DC
  Filled 2023-06-14: qty 18, fill #0
  Filled 2023-07-11: qty 18, 50d supply, fill #0
  Filled 2023-08-26: qty 18, 50d supply, fill #1
  Filled 2023-10-01 – 2023-10-28 (×3): qty 18, 50d supply, fill #2
  Filled 2023-11-25 – 2023-12-31 (×2): qty 18, 50d supply, fill #3

## 2023-06-14 MED ORDER — KETOROLAC TROMETHAMINE 60 MG/2ML IM SOLN
60.0000 mg | Freq: Once | INTRAMUSCULAR | Status: AC
Start: 2023-06-14 — End: 2023-06-14
  Administered 2023-06-14: 60 mg via INTRAMUSCULAR

## 2023-06-14 NOTE — Progress Notes (Unsigned)
Patient states pain in knees, asking for toradol shot.   States she had bone marrow test done.   Patient has multiple concerns to discuss.

## 2023-06-14 NOTE — Progress Notes (Unsigned)
Subjective   Patient ID: Caroline Ryan, female    DOB: 21-Apr-1966, 57 y.o.   MRN: [id number]  Chief Complaint  Patient presents with   Medical Management of Chronic Issues    Referring provider: Ivonne Andrew, NP  Jeral Fruit Spadoni is a 57 y.o. female with Past Medical History: No date: Anemia No date: Ascending aorta dilatation (HCC)     Comment:  38mm by echo 07/2021 and 40mm by echo 07/2022 No date: Asthma No date: Depression     Comment:  no meds No date: Depression No date: Eczema No date: Fibroid No date: Headache(784.0)     Comment:  otc prn med No date: History of blood transfusion No date: Hypertension No date: Knee pain, bilateral     Comment:  arthralgia knee pain bilateral No date: Lower extremity edema No date: Mitral regurgitation     Comment:  Mild to moderate by echo 07/2021 No date: Seasonal allergies No date: Sleep apnea No date: SVD (spontaneous vaginal delivery)     Comment:  x 3 05/2020: Vaginal polyp   HPI  Patient presents today for an acute visit.  She states that she does have chronic bilateral knee pain.  She has been seen by orthopedics and is awaiting knee replacement but first has to lose more weight.  She states that her knees have flared up and been hurting more over the past few days.  Patient is requesting a referral to pain clinic.  She would like some pain medicine to use sparingly. Denies f/c/s, n/v/d, hemoptysis, PND, leg swelling. Denies chest pain or edema.  No Known Allergies  Immunization History  Administered Date(s) Administered   Influenza Whole 06/30/2009   Influenza,inj,Quad PF,6+ Mos 06/27/2015, 07/04/2016, 06/09/2018, 06/10/2019   PFIZER(Purple Top)SARS-COV-2 Vaccination 12/25/2019, 01/15/2020   Pfizer(Comirnaty)Fall Seasonal Vaccine 12 years and older 09/20/2022   Pneumococcal Polysaccharide-23 04/14/2008, 06/27/2015   Td 10/18/2005   Tdap 07/04/2016    Tobacco History: Social History   Tobacco  Use  Smoking Status Former   Current packs/day: 0.00   Average packs/day: 0.3 packs/day for 2.0 years (0.5 ttl pk-yrs)   Types: Cigarettes   Start date: 08/20/2012   Quit date: 08/20/2014   Years since quitting: 8.8   Passive exposure: Never  Smokeless Tobacco Never   Counseling given: Not Answered   Outpatient Encounter Medications as of 06/14/2023  Medication Sig   acetaminophen (TYLENOL) 325 MG tablet Take 2 tablets (650 mg total) by mouth daily as needed for moderate pain (pain score 4-6), headache or fever.   aspirin 81 MG chewable tablet Chew 1 tablet (81 mg total) by mouth daily.   buPROPion (WELLBUTRIN XL) 150 MG 24 hr tablet Take 1 tablet (150 mg total) by mouth daily.   carvedilol (COREG) 25 MG tablet Take 1 tablet (25 mg total) by mouth 2 (two) times daily.   cetirizine (ZYRTEC) 10 MG tablet Take 1 tablet (10 mg total) by mouth daily.   ciclopirox (LOPROX) 0.77 % cream Apply topically 2 (two) times daily.   cyclobenzaprine (FLEXERIL) 5 MG tablet Take 1 tablet (5 mg total) by mouth 3 (three) times daily as needed for muscle spasms.   diclofenac (VOLTAREN) 75 MG EC tablet Take 1 tablet (75 mg total) by mouth 2 (two) times daily.   diclofenac Sodium (VOLTAREN) 1 % GEL Apply 4 g topically 4 (four) times daily.   docusate sodium (COLACE) 100 MG capsule Take 1 capsule (100 mg total) by mouth 2 (two) times  daily as needed.   ferrous gluconate (FERGON) 324 MG tablet Take 1 tablet (324 mg total) by mouth 2 (two) times daily with a meal.   fluticasone (FLONASE) 50 MCG/ACT nasal spray Place 1 spray into both nostrils daily. (Patient taking differently: Place 1 spray into both nostrils daily as needed for allergies.)   hydrocortisone 2.5 % cream Apply topically 2 (two) times daily. (Patient taking differently: Apply 1 Application topically daily as needed (itching).)   hydrOXYzine (ATARAX) 25 MG tablet Take 1 tablet (25 mg total) by mouth 3 (three) times daily as needed.   ibuprofen (ADVIL)  800 MG tablet Take 1 tablet (800 mg total) by mouth 3 (three) times daily.   ipratropium-albuterol (DUONEB) 0.5-2.5 (3) MG/3ML SOLN Take 3 mLs by nebulization every 6 (six) hours as needed.   meloxicam (MOBIC) 15 MG tablet Take 1 tablet (15 mg total) by mouth daily as needed for pain.   methocarbamol (ROBAXIN-750) 750 MG tablet Take 1 tablet (750 mg total) by mouth 4 (four) times daily. (Patient taking differently: Take 750 mg by mouth every 6 (six) hours as needed for muscle spasms.)   nitroGLYCERIN (NITROSTAT) 0.4 MG SL tablet Place 1 tablet (0.4 mg total) under the tongue every 5 (five) minutes as needed for chest pain (max 3 doses, if no relief call 911).   NONFORMULARY OR COMPOUNDED ITEM Diclofenac 3%, Gabapentin 5%, Lidocaine 5%, Menthol 1%.  Apply 1-2 pumps three to four times per day as needed   olmesartan (BENICAR) 40 MG tablet Take 1 tablet (40 mg total) by mouth daily.   Respiratory Therapy Supplies (NEBULIZER MASK ADULT) MISC Use this mask with your nebulizer.   spironolactone (ALDACTONE) 100 MG tablet Take 1 tablet (100 mg total) by mouth daily.   triamcinolone cream (KENALOG) 0.5 % Apply 1 Application topically 3 (three) times daily.   Vitamin D, Ergocalciferol, (DRISDOL) 1.25 MG (50000 UNIT) CAPS capsule Take 1 capsule (50,000 Units total) by mouth every 7 (seven) days.   [DISCONTINUED] albuterol (VENTOLIN HFA) 108 (90 Base) MCG/ACT inhaler Inhale 1 puff every 6 hours as needed for shortness of breath   albuterol (VENTOLIN HFA) 108 (90 Base) MCG/ACT inhaler Inhale 1 puff every 6 hours as needed for shortness of breath   furosemide (LASIX) 20 MG tablet Take 1 tablet (20 mg total) by mouth daily.   linaclotide (LINZESS) 72 MCG capsule Take 1 capsule (72 mcg total) by mouth daily before breakfast.   [DISCONTINUED] linaclotide (LINZESS) 72 MCG capsule Take 1 capsule (72 mcg total) by mouth daily before breakfast.   Facility-Administered Encounter Medications as of 06/14/2023  Medication    [COMPLETED] ketorolac (TORADOL) injection 60 mg   Triamcinolone Acetonide (ZILRETTA) intra-articular injection 32 mg   Triamcinolone Acetonide (ZILRETTA) intra-articular injection 32 mg   [DISCONTINUED] ketorolac (TORADOL) 30 MG/ML injection 30 mg    Review of Systems  Review of Systems  Constitutional: Negative.   HENT: Negative.    Cardiovascular: Negative.   Gastrointestinal: Negative.   Allergic/Immunologic: Negative.   Neurological: Negative.   Psychiatric/Behavioral: Negative.       Objective:   BP (!) 144/76   Pulse 64   Temp 98.7 F (37.1 C) (Oral)   Ht 5\' 1"  (1.549 m)   Wt 245 lb 3.2 oz (111.2 kg)   LMP 04/03/2017 (Approximate)   SpO2 100%   BMI 46.33 kg/m   Wt Readings from Last 5 Encounters:  06/14/23 245 lb 3.2 oz (111.2 kg)  06/06/23 240 lb (108.9 kg)  05/14/23 248 lb 3.2 oz (112.6 kg)  05/01/23 241 lb (109.3 kg)  04/27/23 240 lb 12.8 oz (109.2 kg)     Physical Exam Vitals and nursing note reviewed.  Constitutional:      General: She is not in acute distress.    Appearance: She is well-developed.  Cardiovascular:     Rate and Rhythm: Normal rate and regular rhythm.  Pulmonary:     Effort: Pulmonary effort is normal.     Breath sounds: Normal breath sounds.  Musculoskeletal:     Right knee: Decreased range of motion. Tenderness present.     Left knee: Decreased range of motion. Tenderness present.  Neurological:     Mental Status: She is alert and oriented to person, place, and time.       Assessment & Plan:   Other chronic pain -     Ambulatory referral to Pain Clinic -     Ketorolac Tromethamine  Arthralgia of both knees -     Ketorolac Tromethamine  Mild intermittent asthma, unspecified whether complicated -     Albuterol Sulfate HFA; Inhale 1 puff every 6 hours as needed for shortness of breath  Dispense: 18 g; Refill: 5  Other constipation -     linaCLOtide; Take 1 capsule (72 mcg total) by mouth daily before breakfast.   Dispense: 30 capsule; Refill: 0     Return in about 3 months (around 09/14/2023).   Ivonne Andrew, NP 06/17/2023

## 2023-06-17 ENCOUNTER — Encounter: Payer: Self-pay | Admitting: Nurse Practitioner

## 2023-06-17 ENCOUNTER — Other Ambulatory Visit: Payer: Self-pay

## 2023-06-17 MED ORDER — LINACLOTIDE 72 MCG PO CAPS
72.0000 ug | ORAL_CAPSULE | Freq: Every day | ORAL | 0 refills | Status: DC
Start: 2023-06-17 — End: 2023-10-01
  Filled 2023-06-17 – 2023-07-03 (×2): qty 30, 30d supply, fill #0

## 2023-06-17 NOTE — Patient Instructions (Signed)
1. Other chronic pain  - Ambulatory referral to Pain Clinic - ketorolac (TORADOL) injection 60 mg  2. Arthralgia of both knees  - ketorolac (TORADOL) injection 60 mg  3. Mild intermittent asthma, unspecified whether complicated  - albuterol (VENTOLIN HFA) 108 (90 Base) MCG/ACT inhaler; Inhale 1 puff every 6 hours as needed for shortness of breath  Dispense: 18 g; Refill: 5  4. Other constipation  - linaclotide (LINZESS) 72 MCG capsule; Take 1 capsule (72 mcg total) by mouth daily before breakfast.  Dispense: 30 capsule; Refill: 0  Follow up:  Follow up in 3 months

## 2023-06-18 ENCOUNTER — Other Ambulatory Visit: Payer: Self-pay

## 2023-06-19 ENCOUNTER — Telehealth: Payer: Self-pay

## 2023-06-19 ENCOUNTER — Other Ambulatory Visit: Payer: Self-pay

## 2023-06-19 NOTE — Telephone Encounter (Signed)
Pharmacy Patient Advocate Encounter  Received notification from [facility] that Prior Authorization for Caroline Ryan has been APPROVED from 06/18/2023 to 06/17/2024   PA #/Case ID/Reference #: [id number]

## 2023-06-21 ENCOUNTER — Encounter (HOSPITAL_COMMUNITY): Payer: Self-pay

## 2023-06-26 ENCOUNTER — Encounter (HOSPITAL_COMMUNITY): Payer: Medicare Other | Admitting: Psychiatry

## 2023-06-26 ENCOUNTER — Other Ambulatory Visit (HOSPITAL_BASED_OUTPATIENT_CLINIC_OR_DEPARTMENT_OTHER): Payer: Self-pay

## 2023-06-26 ENCOUNTER — Other Ambulatory Visit: Payer: Self-pay

## 2023-06-27 NOTE — Addendum Note (Signed)
Addended by: Luellen Pucker on: 06/27/2023 08:44 AM   Modules accepted: Orders

## 2023-06-27 NOTE — Telephone Encounter (Signed)
Reviewed history, patient is due for repeat echo in December of 2024 for dilated aorta. Updated order placed. Forwarded to echo scheduling as patient is already scheduled.

## 2023-06-28 ENCOUNTER — Encounter: Payer: Medicare Other | Admitting: Physical Medicine and Rehabilitation

## 2023-06-28 ENCOUNTER — Other Ambulatory Visit: Payer: Self-pay

## 2023-07-02 ENCOUNTER — Other Ambulatory Visit: Payer: Self-pay | Admitting: Nurse Practitioner

## 2023-07-02 ENCOUNTER — Other Ambulatory Visit: Payer: Self-pay

## 2023-07-02 MED ORDER — ACETAMINOPHEN 325 MG PO TABS
650.0000 mg | ORAL_TABLET | Freq: Every day | ORAL | 0 refills | Status: DC | PRN
  Filled 2023-07-02 – 2023-07-11 (×3): qty 100, 50d supply, fill #0

## 2023-07-03 ENCOUNTER — Other Ambulatory Visit: Payer: Self-pay | Admitting: Nurse Practitioner

## 2023-07-03 ENCOUNTER — Other Ambulatory Visit: Payer: Self-pay | Admitting: Physical Medicine and Rehabilitation

## 2023-07-03 ENCOUNTER — Other Ambulatory Visit: Payer: Self-pay

## 2023-07-03 DIAGNOSIS — G8929 Other chronic pain: Secondary | ICD-10-CM

## 2023-07-03 MED ORDER — CICLOPIROX OLAMINE 0.77 % EX CREA
TOPICAL_CREAM | Freq: Two times a day (BID) | CUTANEOUS | 0 refills | Status: DC
Start: 2023-07-03 — End: 2023-11-25
  Filled 2023-07-03: qty 15, 7d supply, fill #0

## 2023-07-03 MED ORDER — DICLOFENAC SODIUM 75 MG PO TBEC
75.0000 mg | DELAYED_RELEASE_TABLET | Freq: Two times a day (BID) | ORAL | 0 refills | Status: DC
  Filled 2023-07-03: qty 30, 15d supply, fill #0

## 2023-07-03 MED ORDER — CYCLOBENZAPRINE HCL 5 MG PO TABS
5.0000 mg | ORAL_TABLET | Freq: Three times a day (TID) | ORAL | 0 refills | Status: DC | PRN
  Filled 2023-07-03: qty 30, 10d supply, fill #0

## 2023-07-03 MED ORDER — TRIAMCINOLONE ACETONIDE 0.5 % EX CREA
1.0000 | TOPICAL_CREAM | Freq: Three times a day (TID) | CUTANEOUS | 0 refills | Status: DC
  Filled 2023-07-03: qty 30, 10d supply, fill #0

## 2023-07-03 NOTE — Telephone Encounter (Signed)
Please advise Kh

## 2023-07-03 NOTE — Telephone Encounter (Signed)
Please advise KH 

## 2023-07-05 ENCOUNTER — Other Ambulatory Visit: Payer: Self-pay

## 2023-07-05 MED ORDER — METHOCARBAMOL 750 MG PO TABS
750.0000 mg | ORAL_TABLET | Freq: Four times a day (QID) | ORAL | 3 refills | Status: AC
  Filled 2023-07-05: qty 120, 30d supply, fill #0
  Filled 2023-12-31 (×2): qty 120, 30d supply, fill #1
  Filled 2024-06-19: qty 120, 30d supply, fill #2

## 2023-07-08 ENCOUNTER — Other Ambulatory Visit: Payer: Self-pay

## 2023-07-09 NOTE — Progress Notes (Unsigned)
BH MD Outpatient Progress Note  07/09/2023 3:18 PM Aedan Burford [facility]  MRN: [id number]  Assessment:  [facility] presents for follow-up evaluation in-person.  Patient continues to meet criteria for MDD and ***anxiety. Other diagnoses explored during this visit include ***.  Identifying Information: Paycen Tech is a 57 y.o. y.o. female with a past psychiatric history of MDD and GAD who is an established patient with [facility] for medication management.   Plan:  # Major depressive disorder, recurrent, moderate Past medication trials: escitalopram - unfavorable weight gain Interventions: -- continue bupropion ER 150 mg daily ***   # Trauma and stressor-related disorder Interventions: -- will need to be explored further ***   # Unspecified anxiety Interventions: -- will need to be explored further   # Cannabis use disorder, in sustained remission Interventions: -- encourage continued abstention  Patient was reminded of contact information for behavioral health clinic and was instructed to call [phone] or [phone] for emergencies.   Subjective:  Chief Complaint: "***"  Interval History:  Today (07/09/2023) patient reports ***  Patient {med side-effects:28140}.  Visit Diagnosis: No diagnosis found.  Past Psychiatric History: Previous Psych Diagnoses: Recently diagnosed with MDD by therapist at [facility] Prior inpatient treatment: Denies Psychotherapy hx: Getting therapy with Starleen Arms [facility] Previous suicidal attempts: Denies Previous medication trials: None History of detox: N/A History of rehab: N/A  Substance Abuse History: Formerly smoked, quit 2016. Former cannabis use, quit in 2019.   Past Medical History:  Past Medical History:  Diagnosis Date   Anemia    Ascending aorta dilatation (HCC)    38mm by echo 07/2021 and 40mm by echo 07/2022   Asthma    Depression    no meds   Depression    Eczema    Fibroid     Headache(784.0)    otc prn med   History of blood transfusion    Hypertension    Knee pain, bilateral    arthralgia knee pain bilateral   Lower extremity edema    Mitral regurgitation    Mild to moderate by echo 07/2021   Seasonal allergies    Sleep apnea    SVD (spontaneous vaginal delivery)    x 3   Vaginal polyp 05/2020    Past Surgical History:  Procedure Laterality Date   ABDOMINAL HYSTERECTOMY     BILATERAL SALPINGECTOMY Bilateral 10/10/2017   Procedure: BILATERAL SALPINGECTOMY;  Surgeon: Hermina Staggers, MD;  Location: [facility];  Service: Gynecology;  Laterality: Bilateral;   BREAST BIOPSY     US guided core 2009   BREAST EXCISIONAL BIOPSY     right 1982 left 1981   BREAST SURGERY     bilateral cysts/benign   COLONOSCOPY  10/17/2021   VAGINAL HYSTERECTOMY N/A 10/10/2017   Procedure: HYSTERECTOMY VAGINAL;  Surgeon: Hermina Staggers, MD;  Location: [facility];  Service: Gynecology;  Laterality: N/A;   Family Psychiatric History: Psych: Son- Schizophrenia, he comes to [facility]   Family History:  Family History  Problem Relation Age of Onset   Breast cancer Mother 59   Diabetes Father    Hypertension Father    Diabetes Brother    Diabetes Brother    Other Neg Hx    Colon polyps Neg Hx    Colon cancer Neg Hx    Esophageal cancer Neg Hx    Stomach cancer Neg Hx    Rectal cancer Neg Hx     Social History:  Social History   Socioeconomic History   Marital status: Single    Spouse name: Not on file   Number of children: 3   Years of education: Not on file   Highest education level: Not on file  Occupational History   Not on file  Tobacco Use   Smoking status: Former    Current packs/day: 0.00    Average packs/day: 0.3 packs/day for 2.0 years (0.5 ttl pk-yrs)    Types: Cigarettes    Start date: 08/20/2012    Quit date: 08/20/2014    Years since quitting: 8.8    Passive exposure: Never   Smokeless tobacco: Never  Vaping Use   Vaping status: Never Used   Substance and Sexual Activity   Alcohol use: Yes    Alcohol/week: 2.0 standard drinks of alcohol    Types: 2 Glasses of wine per week    Comment: once a week   Drug use: No   Sexual activity: Yes    Birth control/protection: Condom  Other Topics Concern   Not on file  Social History Narrative   Not on file   Social Determinants of Health   Financial Resource Strain: High Risk (08/21/2022)   Overall Financial Resource Strain (CARDIA)    Difficulty of Paying Living Expenses: Very hard  Food Insecurity: Food Insecurity Present (08/21/2022)   Hunger Vital Sign    Worried About Running Out of Food in the Last Year: Often true    Ran Out of Food in the Last Year: Often true  Transportation Needs: No Transportation Needs (08/21/2022)   PRAPARE - Administrator, Civil Service (Medical): No    Lack of Transportation (Non-Medical): No  Physical Activity: Inactive (08/21/2022)   Exercise Vital Sign    Days of Exercise per Week: 0 days    Minutes of Exercise per Session: 0 min  Stress: Stress Concern Present (07/27/2022)   [employer] of Occupational Health - Occupational Stress Questionnaire    Feeling of Stress : Very much  Social Connections: Socially Isolated (08/21/2022)   Social Connection and Isolation Panel [NHANES]    Frequency of Communication with Friends and Family: More than three times a week    Frequency of Social Gatherings with Friends and Family: Never    Attends Religious Services: Never    Database administrator or Organizations: No    Attends Engineer, structural: Never    Marital Status: Never married    Allergies:  No Known Allergies  Current Medications: Current Outpatient Medications  Medication Sig Dispense Refill   acetaminophen (TYLENOL) 325 MG tablet Take 2 tablets (650 mg total) by mouth daily as needed for moderate pain (pain score 4-6), headache or fever. 100 tablet 0   albuterol (VENTOLIN HFA) 108 (90 Base) MCG/ACT inhaler Inhale  1 puff every 6 hours as needed for shortness of breath 18 g 5   aspirin 81 MG chewable tablet Chew 1 tablet (81 mg total) by mouth daily. 30 tablet 2   buPROPion (WELLBUTRIN XL) 150 MG 24 hr tablet Take 1 tablet (150 mg total) by mouth daily. 30 tablet 3   carvedilol (COREG) 25 MG tablet Take 1 tablet (25 mg total) by mouth 2 (two) times daily. 180 tablet 3   cetirizine (ZYRTEC) 10 MG tablet Take 1 tablet (10 mg total) by mouth daily. 30 tablet 6   ciclopirox (LOPROX) 0.77 % cream Apply topically 2 (two) times daily. 15 g 0   cyclobenzaprine (FLEXERIL) 5 MG  tablet Take 1 tablet (5 mg total) by mouth 3 (three) times daily as needed for muscle spasms. 30 tablet 0   diclofenac (VOLTAREN) 75 MG EC tablet Take 1 tablet (75 mg total) by mouth 2 (two) times daily. 30 tablet 0   diclofenac Sodium (VOLTAREN) 1 % GEL Apply 4 g topically 4 (four) times daily. 100 g 3   docusate sodium (COLACE) 100 MG capsule Take 1 capsule (100 mg total) by mouth 2 (two) times daily as needed. 30 capsule 2   ferrous gluconate (FERGON) 324 MG tablet Take 1 tablet (324 mg total) by mouth 2 (two) times daily with a meal. 60 tablet 0   fluticasone (FLONASE) 50 MCG/ACT nasal spray Place 1 spray into both nostrils daily. (Patient taking differently: Place 1 spray into both nostrils daily as needed for allergies.) 16 g 2   furosemide (LASIX) 20 MG tablet Take 1 tablet (20 mg total) by mouth daily. 30 tablet 6   hydrocortisone 2.5 % cream Apply topically 2 (two) times daily. (Patient taking differently: Apply 1 Application topically daily as needed (itching).) 30 g 1   hydrOXYzine (ATARAX) 25 MG tablet Take 1 tablet (25 mg total) by mouth 3 (three) times daily as needed. 30 tablet 11   ibuprofen (ADVIL) 800 MG tablet Take 1 tablet (800 mg total) by mouth 3 (three) times daily. 30 tablet 6   ipratropium-albuterol (DUONEB) 0.5-2.5 (3) MG/3ML SOLN Take 3 mLs by nebulization every 6 (six) hours as needed. 360 mL 0   linaclotide (LINZESS)  72 MCG capsule Take 1 capsule (72 mcg total) by mouth daily before breakfast. 30 capsule 0   meloxicam (MOBIC) 15 MG tablet Take 1 tablet (15 mg total) by mouth daily as needed for pain. 90 tablet 3   methocarbamol (ROBAXIN-750) 750 MG tablet Take 1 tablet (750 mg total) by mouth 4 (four) times daily. 120 tablet 3   nitroGLYCERIN (NITROSTAT) 0.4 MG SL tablet Place 1 tablet (0.4 mg total) under the tongue every 5 (five) minutes as needed for chest pain (max 3 doses, if no relief call 911). 25 tablet 5   NONFORMULARY OR COMPOUNDED ITEM Diclofenac 3%, Gabapentin 5%, Lidocaine 5%, Menthol 1%.  Apply 1-2 pumps three to four times per day as needed 1 each 0   olmesartan (BENICAR) 40 MG tablet Take 1 tablet (40 mg total) by mouth daily. 90 tablet 3   Respiratory Therapy Supplies (NEBULIZER MASK ADULT) MISC Use this mask with your nebulizer. 1 each 0   spironolactone (ALDACTONE) 100 MG tablet Take 1 tablet (100 mg total) by mouth daily. 90 tablet 3   triamcinolone cream (KENALOG) 0.5 % Apply 1 Application topically 3 (three) times daily. 30 g 0   Vitamin D, Ergocalciferol, (DRISDOL) 1.25 MG (50000 UNIT) CAPS capsule Take 1 capsule (50,000 Units total) by mouth every 7 (seven) days. 12 capsule 0   Current Facility-Administered Medications  Medication Dose Route Frequency Provider Last Rate Last Admin   Triamcinolone Acetonide (ZILRETTA) intra-articular injection 32 mg  32 mg Intra-articular Once Raulkar, Drema Pry, MD       Triamcinolone Acetonide (ZILRETTA) intra-articular injection 32 mg  32 mg Intra-articular Once Raulkar, Drema Pry, MD        ROS: ROS  Objective:  Psychiatric Specialty Exam: Last menstrual period 04/03/2017.There is no height or weight on file to calculate BMI.  General Appearance: {Appearance:22683}  Eye Contact:  {BHH EYE CONTACT:22684}  Speech:  {Speech:22685}  Volume:  {Volume (PAA):22686}  Mood: "***"  Affect:  {Affect (PAA):22687}  Thought Content: {Thought  Content:22690}   Suicidal Thoughts:  {ST/HT (PAA):22692}  Homicidal Thoughts:  {ST/HT (PAA):22692}  Thought Process:  {Thought Process (PAA):22688}  Orientation:  not formally assessed    Memory:  grossly intact  Judgment:  {Judgement (PAA):22694}  Insight:  {Insight (PAA):22695}  Concentration:  {Concentration:21399}  Recall:  not formally assessed  Fund of Knowledge: {BHH GOOD/FAIR/POOR:22877}  Language: {BHH GOOD/FAIR/POOR:22877}  Psychomotor Activity:  {Psychomotor (PAA):22696}  Akathisia: {BHH YES OR NO:22294}  AIMS (if indicated): {Desc; done/not:10129}  Assets: {Assets (PAA):22698}  ADL's: {BHH HKV'Q:25956}  Cognition: {chl bhh cognition:304700322}  Sleep:  {BHH GOOD/FAIR/POOR:22877}   Physical Exam Physical Exam  Metabolic Disorder Labs: Lab Results  Component Value Date   HGBA1C 4.9 02/27/2023   MPG 82 07/04/2016   MPG 100 06/27/2015   No results found for: "PROLACTIN" Lab Results  Component Value Date   CHOL 186 02/27/2023   TRIG 79 02/27/2023   HDL 55 02/27/2023   CHOLHDL 3.4 02/27/2023   VLDL 9 06/27/2015   LDLCALC 116 (H) 02/27/2023   LDLCALC 119 (H) 09/08/2021   Lab Results  Component Value Date   TSH 2.710 02/27/2023   TSH 1.530 12/24/2018    Therapeutic Level Labs: No results found for: "CBMZ" No results found for: "LITHIUM" No results found for: "VALPROATE"  Screenings:  GAD-7    Flowsheet Row Office Visit from 09/21/2022 in [facility] Counselor from 08/21/2022 in [facility] Office Visit from [facility] Office Visit from 07/19/2017 in [facility]  Total GAD-7 Score 17 21 9 14       PHQ2-9    Flowsheet Row Office Visit from 04/26/2023 in [facility] [facility] Clinical Support from 03/27/2023 in [facility] Office  Visit from 09/21/2022 in [facility] Counselor from 08/21/2022 in [facility] Patient Outreach Telephone from 07/27/2022 in [place] [facility]  PHQ-2 Total Score 3 4 4 6 6   PHQ-9 Total Score -- 14 16 22 10       Flowsheet Row CT Bone Marrow Biopsy from 06/06/2023 in [facility] from 10/23/2022 in [facility] Emergency Department at [facility] Counselor from 08/21/2022 in [facility]  C-SSRS RISK CATEGORY No Risk No Risk Low Risk       Collaboration of Care: none  Augusto Gamble, MD 07/09/2023, 3:18 PM

## 2023-07-10 ENCOUNTER — Ambulatory Visit (HOSPITAL_COMMUNITY): Payer: Medicare Other | Admitting: Psychiatry

## 2023-07-11 ENCOUNTER — Other Ambulatory Visit: Payer: Self-pay

## 2023-07-14 DIAGNOSIS — I890 Lymphedema, not elsewhere classified: Secondary | ICD-10-CM | POA: Diagnosis not present

## 2023-07-15 ENCOUNTER — Other Ambulatory Visit: Payer: Self-pay

## 2023-07-30 ENCOUNTER — Ambulatory Visit (HOSPITAL_COMMUNITY): Payer: Medicare Other | Attending: Cardiology

## 2023-07-30 DIAGNOSIS — I77810 Thoracic aortic ectasia: Secondary | ICD-10-CM | POA: Insufficient documentation

## 2023-07-30 DIAGNOSIS — N189 Chronic kidney disease, unspecified: Secondary | ICD-10-CM | POA: Insufficient documentation

## 2023-07-30 DIAGNOSIS — G4730 Sleep apnea, unspecified: Secondary | ICD-10-CM | POA: Diagnosis not present

## 2023-07-30 DIAGNOSIS — R609 Edema, unspecified: Secondary | ICD-10-CM | POA: Insufficient documentation

## 2023-07-30 DIAGNOSIS — I340 Nonrheumatic mitral (valve) insufficiency: Secondary | ICD-10-CM | POA: Diagnosis not present

## 2023-07-30 DIAGNOSIS — I129 Hypertensive chronic kidney disease with stage 1 through stage 4 chronic kidney disease, or unspecified chronic kidney disease: Secondary | ICD-10-CM | POA: Insufficient documentation

## 2023-07-30 LAB — ECHOCARDIOGRAM COMPLETE
Area-P 1/2: 5.27 cm2
MV M vel: 4.77 m/s
MV Peak grad: 91 mm[Hg]
Radius: 0.6 cm
S' Lateral: 3.7 cm

## 2023-08-05 ENCOUNTER — Inpatient Hospital Stay: Payer: Medicare Other | Attending: Hematology

## 2023-08-05 ENCOUNTER — Encounter: Payer: Self-pay | Admitting: Hematology

## 2023-08-05 ENCOUNTER — Encounter (HOSPITAL_COMMUNITY): Payer: Self-pay

## 2023-08-05 DIAGNOSIS — Z79899 Other long term (current) drug therapy: Secondary | ICD-10-CM | POA: Diagnosis not present

## 2023-08-05 DIAGNOSIS — D649 Anemia, unspecified: Secondary | ICD-10-CM

## 2023-08-05 DIAGNOSIS — D631 Anemia in chronic kidney disease: Secondary | ICD-10-CM | POA: Diagnosis present

## 2023-08-05 DIAGNOSIS — N1831 Chronic kidney disease, stage 3a: Secondary | ICD-10-CM | POA: Insufficient documentation

## 2023-08-05 DIAGNOSIS — N179 Acute kidney failure, unspecified: Secondary | ICD-10-CM

## 2023-08-05 LAB — CBC WITH DIFFERENTIAL/PLATELET
Abs Immature Granulocytes: 0.01 10*3/uL (ref 0.00–0.07)
Basophils Absolute: 0 10*3/uL (ref 0.0–0.1)
Basophils Relative: 1 %
Eosinophils Absolute: 0.1 10*3/uL (ref 0.0–0.5)
Eosinophils Relative: 3 %
HCT: 32.2 % — ABNORMAL LOW (ref 36.0–46.0)
Hemoglobin: 10 g/dL — ABNORMAL LOW (ref 12.0–15.0)
Immature Granulocytes: 0 %
Lymphocytes Relative: 40 %
Lymphs Abs: 2 10*3/uL (ref 0.7–4.0)
MCH: 31 pg (ref 26.0–34.0)
MCHC: 31.1 g/dL (ref 30.0–36.0)
MCV: 99.7 fL (ref 80.0–100.0)
Monocytes Absolute: 0.5 10*3/uL (ref 0.1–1.0)
Monocytes Relative: 10 %
Neutro Abs: 2.3 10*3/uL (ref 1.7–7.7)
Neutrophils Relative %: 46 %
Platelets: 136 10*3/uL — ABNORMAL LOW (ref 150–400)
RBC: 3.23 MIL/uL — ABNORMAL LOW (ref 3.87–5.11)
RDW: 12.7 % (ref 11.5–15.5)
WBC: 5 10*3/uL (ref 4.0–10.5)
nRBC: 0 % (ref 0.0–0.2)

## 2023-08-05 LAB — CMP (CANCER CENTER ONLY)
ALT: 15 U/L (ref 0–44)
AST: 13 U/L — ABNORMAL LOW (ref 15–41)
Albumin: 4 g/dL (ref 3.5–5.0)
Alkaline Phosphatase: 95 U/L (ref 38–126)
Anion gap: 3 — ABNORMAL LOW (ref 5–15)
BUN: 37 mg/dL — ABNORMAL HIGH (ref 6–20)
CO2: 28 mmol/L (ref 22–32)
Calcium: 10.3 mg/dL (ref 8.9–10.3)
Chloride: 108 mmol/L (ref 98–111)
Creatinine: 1.14 mg/dL — ABNORMAL HIGH (ref 0.44–1.00)
GFR, Estimated: 55 mL/min — ABNORMAL LOW (ref >60)
Glucose, Bld: 95 mg/dL (ref 70–99)
Potassium: 4.4 mmol/L (ref 3.5–5.1)
Sodium: 139 mmol/L (ref 135–145)
Total Bilirubin: 0.6 mg/dL (ref <1.2)
Total Protein: 6.9 g/dL (ref 6.5–8.1)

## 2023-08-05 LAB — SAMPLE TO BLOOD BANK

## 2023-08-05 NOTE — Progress Notes (Signed)
[facility]   [phone] [phone]   Clinic Follow up Note   Patient Care Team: Ivonne Andrew, NP as PCP - General (Pulmonary Disease) [name], MD as PCP - Cardiology (Cardiology) Heidi Dach, RN as Case Manager Shaune Leeks as Social Worker Malachy Mood, MD as Consulting Physician (Hematology) 06/13/2023  I connected with Caroline Ryan on 06/13/2023 at  8:00 AM EDT by telephone and verified that I am speaking with the correct person using two identifiers.   I discussed the limitations, risks, security and privacy concerns of performing an evaluation and management service by telephone and the availability of in person appointments. I also discussed with the patient that there may be a patient responsible charge related to this service. The patient expressed understanding and agreed to proceed.   Patient's location:  home  Provider's location:  Office    CHIEF COMPLAINT: f/u anemia    CURRENT THERAPY: observation    Assessment and Plan  57 yo female   Anemia of chronic disease  -Chronic anemia since 2009, hemoglobin around 9 lately -I reviewed her lab work in 04/2023 for anemia, which ruled out multiple myeloma, heavy metal toxicity, hepatitis, HIV, and sickle cell. Erythropoietin level normal, no evidence of hemolysis. Possible anemia of chronic disease due to kidney issues,  - She underwent a bone marrow biopsy on June 06, 2023, which showed hypercellular bone marrow (60 to 70%) with orderly trilineage hematopoiesis and borderline minimally increased blasts/immature precursors by CD34 IHC, probably reactive  -Her cytogenetics and MDS panel results are pending -This is likely anemia of chronic disease, secondary to CKD and other medical comorbidities. -Since her hemoglobin has been around 9-10, no need particular treatment at this point.  We discussed the role of ESA when her hemoglobin persistently less than below  9.  Plan -Lab reviewed, including recent bone marrow biopsy's and molecular testing results -I recommend monitor her CBC every 3 months -Follow-up in 6 months   History of Present Illness    Patient is scheduled for a phone visit for follow-up of her anemia.  She underwent a bone marrow biopsy on June 06, 2023, she tolerated procedure well.  She has no new complaints since her last visit.      REVIEW OF SYSTEMS:   Constitutional: Denies fevers, chills or abnormal weight loss Eyes: Denies blurriness of vision Ears, nose, mouth, throat, and face: Denies mucositis or sore throat Respiratory: Denies cough, dyspnea or wheezes Cardiovascular: Denies palpitation, chest discomfort or lower extremity swelling Gastrointestinal:  Denies nausea, heartburn or change in bowel habits Skin: Denies abnormal skin rashes Lymphatics: Denies new lymphadenopathy or easy bruising Neurological:Denies numbness, tingling or new weaknesses Behavioral/Psych: Mood is stable, no new changes  All other systems were reviewed with the patient and are negative.  MEDICAL HISTORY:  Past Medical History:  Diagnosis Date   Anemia    Ascending aorta dilatation (HCC)    38mm by echo 07/2021 and 40mm by echo 07/2022   Asthma    Depression    no meds   Depression    Eczema    Fibroid    Headache(784.0)    otc prn med   History of blood transfusion    Hypertension    Knee pain, bilateral    arthralgia knee pain bilateral   Lower extremity edema    Mitral regurgitation    Mild to moderate by echo 07/2021   Seasonal allergies    Sleep  apnea    SVD (spontaneous vaginal delivery)    x 3   Vaginal polyp 05/2020    SURGICAL HISTORY: Past Surgical History:  Procedure Laterality Date   ABDOMINAL HYSTERECTOMY     BILATERAL SALPINGECTOMY Bilateral 10/10/2017   Procedure: BILATERAL SALPINGECTOMY;  Surgeon: Hermina Staggers, MD;  Location: [facility];  Service: Gynecology;  Laterality: Bilateral;   BREAST  BIOPSY     US guided core 2009   BREAST EXCISIONAL BIOPSY     right 1982 left 1981   BREAST SURGERY     bilateral cysts/benign   COLONOSCOPY  10/17/2021   VAGINAL HYSTERECTOMY N/A 10/10/2017   Procedure: HYSTERECTOMY VAGINAL;  Surgeon: Hermina Staggers, MD;  Location: [facility];  Service: Gynecology;  Laterality: N/A;    I have reviewed the social history and family history with the patient and they are unchanged from previous note.  ALLERGIES:  has no known allergies.  MEDICATIONS:  Current Outpatient Medications  Medication Sig Dispense Refill   acetaminophen (TYLENOL) 325 MG tablet Take 2 tablets (650 mg total) by mouth daily as needed for moderate pain (pain score 4-6), headache or fever. 100 tablet 0   albuterol (VENTOLIN HFA) 108 (90 Base) MCG/ACT inhaler Inhale 1 puff every 6 hours as needed for shortness of breath 18 g 5   aspirin 81 MG chewable tablet Chew 1 tablet (81 mg total) by mouth daily. 30 tablet 2   buPROPion (WELLBUTRIN XL) 150 MG 24 hr tablet Take 1 tablet (150 mg total) by mouth daily. 30 tablet 3   carvedilol (COREG) 25 MG tablet Take 1 tablet (25 mg total) by mouth 2 (two) times daily. 180 tablet 3   cetirizine (ZYRTEC) 10 MG tablet Take 1 tablet (10 mg total) by mouth daily. 30 tablet 6   ciclopirox (LOPROX) 0.77 % cream Apply topically 2 (two) times daily. 15 g 0   cyclobenzaprine (FLEXERIL) 5 MG tablet Take 1 tablet (5 mg total) by mouth 3 (three) times daily as needed for muscle spasms. 30 tablet 0   diclofenac (VOLTAREN) 75 MG EC tablet Take 1 tablet (75 mg total) by mouth 2 (two) times daily. 30 tablet 0   diclofenac Sodium (VOLTAREN) 1 % GEL Apply 4 g topically 4 (four) times daily. 100 g 3   docusate sodium (COLACE) 100 MG capsule Take 1 capsule (100 mg total) by mouth 2 (two) times daily as needed. 30 capsule 2   ferrous gluconate (FERGON) 324 MG tablet Take 1 tablet (324 mg total) by mouth 2 (two) times daily with a meal. 60 tablet 0   fluticasone (FLONASE)  50 MCG/ACT nasal spray Place 1 spray into both nostrils daily. (Patient taking differently: Place 1 spray into both nostrils daily as needed for allergies.) 16 g 2   furosemide (LASIX) 20 MG tablet Take 1 tablet (20 mg total) by mouth daily. 30 tablet 6   hydrocortisone 2.5 % cream Apply topically 2 (two) times daily. (Patient taking differently: Apply 1 Application topically daily as needed (itching).) 30 g 1   hydrOXYzine (ATARAX) 25 MG tablet Take 1 tablet (25 mg total) by mouth 3 (three) times daily as needed. 30 tablet 11   ibuprofen (ADVIL) 800 MG tablet Take 1 tablet (800 mg total) by mouth 3 (three) times daily. 30 tablet 6   ipratropium-albuterol (DUONEB) 0.5-2.5 (3) MG/3ML SOLN Take 3 mLs by nebulization every 6 (six) hours as needed. 360 mL 0   linaclotide (LINZESS) 72 MCG capsule Take 1 capsule (  72 mcg total) by mouth daily before breakfast. 30 capsule 0   meloxicam (MOBIC) 15 MG tablet Take 1 tablet (15 mg total) by mouth daily as needed for pain. 90 tablet 3   methocarbamol (ROBAXIN-750) 750 MG tablet Take 1 tablet (750 mg total) by mouth 4 (four) times daily. 120 tablet 3   nitroGLYCERIN (NITROSTAT) 0.4 MG SL tablet Place 1 tablet (0.4 mg total) under the tongue every 5 (five) minutes as needed for chest pain (max 3 doses, if no relief call 911). 25 tablet 5   NONFORMULARY OR COMPOUNDED ITEM Diclofenac 3%, Gabapentin 5%, Lidocaine 5%, Menthol 1%.  Apply 1-2 pumps three to four times per day as needed 1 each 0   olmesartan (BENICAR) 40 MG tablet Take 1 tablet (40 mg total) by mouth daily. 90 tablet 3   Respiratory Therapy Supplies (NEBULIZER MASK ADULT) MISC Use this mask with your nebulizer. 1 each 0   spironolactone (ALDACTONE) 100 MG tablet Take 1 tablet (100 mg total) by mouth daily. 90 tablet 3   triamcinolone cream (KENALOG) 0.5 % Apply 1 Application topically 3 (three) times daily. 30 g 0   Vitamin D, Ergocalciferol, (DRISDOL) 1.25 MG (50000 UNIT) CAPS capsule Take 1 capsule  (50,000 Units total) by mouth every 7 (seven) days. 12 capsule 0   Current Facility-Administered Medications  Medication Dose Route Frequency Provider Last Rate Last Admin   Triamcinolone Acetonide (ZILRETTA) intra-articular injection 32 mg  32 mg Intra-articular Once Raulkar, Drema Pry, MD       Triamcinolone Acetonide (ZILRETTA) intra-articular injection 32 mg  32 mg Intra-articular Once Raulkar, Drema Pry, MD        PHYSICAL EXAMINATION: Not performed   LABORATORY DATA:  I have reviewed the data as listed    Latest Ref Rng & Units  06/06/2023    8:24 AM 05/14/2023    9:14 AM    WBC 4.0 - 10.5 K/uL  5.8  4.3   Hemoglobin 12.0 - 15.0 g/dL  9.8  8.9   Hematocrit 36.0 - 46.0 %  32.5  28.9   Platelets 150 - 400 K/uL  143  167          RADIOGRAPHIC STUDIES: I have personally reviewed the radiological images as listed and agreed with the findings in the report. No results found.     I discussed the assessment and treatment plan with the patient. The patient was provided an opportunity to ask questions and all were answered. The patient agreed with the plan and demonstrated an understanding of the instructions.   The patient was advised to call back or seek an in-person evaluation if the symptoms worsen or if the condition fails to improve as anticipated.  I provided 15 minutes of non face-to-face telephone visit time during this encounter, and > 50% was spent counseling as documented under my assessment & plan.     Malachy Mood, MD 06/13/2023

## 2023-08-06 ENCOUNTER — Encounter: Payer: Self-pay | Admitting: Cardiology

## 2023-08-06 ENCOUNTER — Telehealth: Payer: Self-pay

## 2023-08-06 NOTE — Telephone Encounter (Addendum)
Called patient to relay the message below as per Dr. Mosetta Putt. Patient voiced full understanding was very happy with the results.   ----- Message from Malachy Mood sent at 08/05/2023  8:21 PM EST ----- Please let her know lab results, anemia has slightly improved since her renal function improved, her previous bone marrow biopsy was unremarkable, will continue lab monitoring.  Malachy Mood

## 2023-08-09 ENCOUNTER — Telehealth: Payer: Self-pay

## 2023-08-09 NOTE — Telephone Encounter (Signed)
Call to patient to discuss Echo results. No answer, no DPR on file. Left message with no identifiers asking recipient to call [place] at our office number. Also released results on MyChart.

## 2023-08-09 NOTE — Telephone Encounter (Signed)
-----   Message from Armanda Magic sent at 08/06/2023  6:58 AM EST ----- Normal pumping function of the heart muscle with a slightly enlarged cavity dimension with mild thickening of the heart muscle.  Mild to moderate leakiness of the mitral valve.  Normal dimensions of the aorta

## 2023-08-12 ENCOUNTER — Encounter
Payer: Medicare Other | Attending: Physical Medicine and Rehabilitation | Admitting: Physical Medicine and Rehabilitation

## 2023-08-12 ENCOUNTER — Other Ambulatory Visit: Payer: Self-pay

## 2023-08-12 DIAGNOSIS — I890 Lymphedema, not elsewhere classified: Secondary | ICD-10-CM | POA: Insufficient documentation

## 2023-08-12 DIAGNOSIS — E559 Vitamin D deficiency, unspecified: Secondary | ICD-10-CM | POA: Insufficient documentation

## 2023-08-12 DIAGNOSIS — M170 Bilateral primary osteoarthritis of knee: Secondary | ICD-10-CM | POA: Insufficient documentation

## 2023-08-12 DIAGNOSIS — G4733 Obstructive sleep apnea (adult) (pediatric): Secondary | ICD-10-CM | POA: Insufficient documentation

## 2023-08-13 DIAGNOSIS — I890 Lymphedema, not elsewhere classified: Secondary | ICD-10-CM | POA: Diagnosis not present

## 2023-08-23 ENCOUNTER — Telehealth: Payer: Self-pay

## 2023-08-23 NOTE — Telephone Encounter (Signed)
 Call to patient to discuss echo results, no answer. Left message asking recipient to call [place] at our office #. Letter sent.

## 2023-08-23 NOTE — Telephone Encounter (Signed)
-----   Message from Armanda Magic sent at 08/06/2023  6:58 AM EST ----- Normal pumping function of the heart muscle with a slightly enlarged cavity dimension with mild thickening of the heart muscle.  Mild to moderate leakiness of the mitral valve.  Normal dimensions of the aorta

## 2023-08-26 ENCOUNTER — Other Ambulatory Visit: Payer: Self-pay

## 2023-08-26 ENCOUNTER — Other Ambulatory Visit: Payer: Self-pay | Admitting: Nurse Practitioner

## 2023-08-26 ENCOUNTER — Other Ambulatory Visit: Payer: Self-pay | Admitting: Family Medicine

## 2023-08-26 ENCOUNTER — Other Ambulatory Visit: Payer: Self-pay | Admitting: Physical Medicine and Rehabilitation

## 2023-08-26 DIAGNOSIS — G8929 Other chronic pain: Secondary | ICD-10-CM

## 2023-08-26 DIAGNOSIS — D508 Other iron deficiency anemias: Secondary | ICD-10-CM

## 2023-08-26 DIAGNOSIS — E559 Vitamin D deficiency, unspecified: Secondary | ICD-10-CM

## 2023-08-26 MED ORDER — VITAMIN D (ERGOCALCIFEROL) 1.25 MG (50000 UNIT) PO CAPS
50000.0000 [IU] | ORAL_CAPSULE | ORAL | 0 refills | Status: DC
  Filled 2023-08-26 – 2023-10-01 (×2): qty 12, 84d supply, fill #0

## 2023-08-26 MED ORDER — CYCLOBENZAPRINE HCL 5 MG PO TABS
5.0000 mg | ORAL_TABLET | Freq: Three times a day (TID) | ORAL | 0 refills | Status: DC | PRN
  Filled 2023-08-26: qty 30, 10d supply, fill #0

## 2023-08-26 MED ORDER — DICLOFENAC SODIUM 75 MG PO TBEC
75.0000 mg | DELAYED_RELEASE_TABLET | Freq: Two times a day (BID) | ORAL | 0 refills | Status: DC
  Filled 2023-08-26: qty 30, 15d supply, fill #0

## 2023-08-26 NOTE — Telephone Encounter (Signed)
 Please advise [facility]

## 2023-08-27 ENCOUNTER — Other Ambulatory Visit: Payer: Self-pay

## 2023-08-30 DIAGNOSIS — M25562 Pain in left knee: Secondary | ICD-10-CM | POA: Diagnosis not present

## 2023-08-30 DIAGNOSIS — M25561 Pain in right knee: Secondary | ICD-10-CM | POA: Diagnosis not present

## 2023-08-30 DIAGNOSIS — M7062 Trochanteric bursitis, left hip: Secondary | ICD-10-CM | POA: Diagnosis not present

## 2023-08-30 DIAGNOSIS — M7061 Trochanteric bursitis, right hip: Secondary | ICD-10-CM | POA: Diagnosis not present

## 2023-08-30 DIAGNOSIS — M170 Bilateral primary osteoarthritis of knee: Secondary | ICD-10-CM | POA: Diagnosis not present

## 2023-08-30 DIAGNOSIS — G894 Chronic pain syndrome: Secondary | ICD-10-CM | POA: Diagnosis not present

## 2023-09-03 ENCOUNTER — Other Ambulatory Visit: Payer: Self-pay

## 2023-09-04 ENCOUNTER — Encounter (HOSPITAL_COMMUNITY): Payer: Medicare Other | Admitting: Psychiatry

## 2023-09-13 DIAGNOSIS — I890 Lymphedema, not elsewhere classified: Secondary | ICD-10-CM | POA: Diagnosis not present

## 2023-09-16 ENCOUNTER — Other Ambulatory Visit: Payer: Self-pay

## 2023-09-16 ENCOUNTER — Other Ambulatory Visit: Payer: Self-pay | Admitting: Nurse Practitioner

## 2023-09-16 ENCOUNTER — Other Ambulatory Visit: Payer: Self-pay | Admitting: Family Medicine

## 2023-09-16 DIAGNOSIS — D508 Other iron deficiency anemias: Secondary | ICD-10-CM

## 2023-09-16 DIAGNOSIS — G8929 Other chronic pain: Secondary | ICD-10-CM

## 2023-09-16 MED ORDER — DICLOFENAC SODIUM 75 MG PO TBEC
75.0000 mg | DELAYED_RELEASE_TABLET | Freq: Two times a day (BID) | ORAL | 0 refills | Status: DC
  Filled 2023-09-16: qty 30, 15d supply, fill #0

## 2023-09-16 MED ORDER — ASPIRIN 81 MG PO CHEW
81.0000 mg | CHEWABLE_TABLET | Freq: Every day | ORAL | 2 refills | Status: DC
  Filled 2023-09-16: qty 30, 30d supply, fill #0
  Filled 2023-10-01 – 2023-10-28 (×3): qty 30, 30d supply, fill #1
  Filled 2023-11-25: qty 30, 30d supply, fill #2

## 2023-09-16 MED ORDER — TRIAMCINOLONE ACETONIDE 0.5 % EX CREA
1.0000 | TOPICAL_CREAM | Freq: Three times a day (TID) | CUTANEOUS | 0 refills | Status: DC
  Filled 2023-09-16: qty 30, 10d supply, fill #0

## 2023-09-17 ENCOUNTER — Other Ambulatory Visit: Payer: Self-pay

## 2023-09-17 MED ORDER — CYCLOBENZAPRINE HCL 5 MG PO TABS
5.0000 mg | ORAL_TABLET | Freq: Three times a day (TID) | ORAL | 0 refills | Status: DC | PRN
  Filled 2023-09-17: qty 30, 10d supply, fill #0

## 2023-09-17 NOTE — Telephone Encounter (Signed)
Patient is returning call and is requesting call back in regards to results.

## 2023-09-18 NOTE — Telephone Encounter (Signed)
Pt returning call, requesting cb

## 2023-09-19 ENCOUNTER — Other Ambulatory Visit: Payer: Self-pay

## 2023-09-19 NOTE — Telephone Encounter (Signed)
Patient is returning call. Requesting call back

## 2023-09-19 NOTE — Telephone Encounter (Signed)
Returned patient's call regarding results, no answer. Left message with no identifiers asking recipient to call [place] and gave [facility] office #.

## 2023-09-19 NOTE — Telephone Encounter (Signed)
Call to patient to discuss echo results. Patient verbalizes understanding of normal pumping function of the heart muscle with a slightly enlarged cavity dimension with mild thickening of the heart muscle. Mild to moderate leakiness of the mitral valve discuss as well as normal dimensions of the aorta.

## 2023-09-20 ENCOUNTER — Other Ambulatory Visit: Payer: Self-pay

## 2023-10-01 ENCOUNTER — Ambulatory Visit (HOSPITAL_COMMUNITY): Payer: 59 | Admitting: Mental Health

## 2023-10-01 ENCOUNTER — Other Ambulatory Visit: Payer: Self-pay | Admitting: Family Medicine

## 2023-10-01 ENCOUNTER — Other Ambulatory Visit: Payer: Self-pay | Admitting: Nurse Practitioner

## 2023-10-01 ENCOUNTER — Other Ambulatory Visit: Payer: Self-pay

## 2023-10-01 DIAGNOSIS — K5909 Other constipation: Secondary | ICD-10-CM

## 2023-10-01 DIAGNOSIS — D508 Other iron deficiency anemias: Secondary | ICD-10-CM

## 2023-10-01 MED ORDER — LINACLOTIDE 72 MCG PO CAPS
72.0000 ug | ORAL_CAPSULE | Freq: Every day | ORAL | 0 refills | Status: DC
  Filled 2023-10-01: qty 30, 30d supply, fill #0

## 2023-10-02 DIAGNOSIS — I10 Essential (primary) hypertension: Secondary | ICD-10-CM | POA: Diagnosis not present

## 2023-10-02 DIAGNOSIS — G4733 Obstructive sleep apnea (adult) (pediatric): Secondary | ICD-10-CM | POA: Diagnosis not present

## 2023-10-03 DIAGNOSIS — G8929 Other chronic pain: Secondary | ICD-10-CM | POA: Diagnosis not present

## 2023-10-03 DIAGNOSIS — M25562 Pain in left knee: Secondary | ICD-10-CM | POA: Diagnosis not present

## 2023-10-03 DIAGNOSIS — M170 Bilateral primary osteoarthritis of knee: Secondary | ICD-10-CM | POA: Diagnosis not present

## 2023-10-03 DIAGNOSIS — M25561 Pain in right knee: Secondary | ICD-10-CM | POA: Diagnosis not present

## 2023-10-07 ENCOUNTER — Inpatient Hospital Stay: Payer: 59 | Attending: Hematology

## 2023-10-07 ENCOUNTER — Inpatient Hospital Stay (HOSPITAL_BASED_OUTPATIENT_CLINIC_OR_DEPARTMENT_OTHER): Payer: 59 | Admitting: Hematology

## 2023-10-07 ENCOUNTER — Other Ambulatory Visit: Payer: Self-pay

## 2023-10-07 ENCOUNTER — Encounter: Payer: Self-pay | Admitting: Hematology

## 2023-10-07 DIAGNOSIS — I10 Essential (primary) hypertension: Secondary | ICD-10-CM | POA: Diagnosis not present

## 2023-10-07 DIAGNOSIS — D631 Anemia in chronic kidney disease: Secondary | ICD-10-CM | POA: Diagnosis not present

## 2023-10-07 DIAGNOSIS — D508 Other iron deficiency anemias: Secondary | ICD-10-CM

## 2023-10-07 DIAGNOSIS — G4730 Sleep apnea, unspecified: Secondary | ICD-10-CM | POA: Insufficient documentation

## 2023-10-07 DIAGNOSIS — F32A Depression, unspecified: Secondary | ICD-10-CM | POA: Insufficient documentation

## 2023-10-07 DIAGNOSIS — N1831 Chronic kidney disease, stage 3a: Secondary | ICD-10-CM | POA: Diagnosis not present

## 2023-10-07 DIAGNOSIS — M1990 Unspecified osteoarthritis, unspecified site: Secondary | ICD-10-CM | POA: Diagnosis not present

## 2023-10-07 DIAGNOSIS — E669 Obesity, unspecified: Secondary | ICD-10-CM | POA: Diagnosis not present

## 2023-10-07 DIAGNOSIS — Z9079 Acquired absence of other genital organ(s): Secondary | ICD-10-CM | POA: Diagnosis not present

## 2023-10-07 DIAGNOSIS — Z79899 Other long term (current) drug therapy: Secondary | ICD-10-CM | POA: Diagnosis not present

## 2023-10-07 DIAGNOSIS — D649 Anemia, unspecified: Secondary | ICD-10-CM

## 2023-10-07 DIAGNOSIS — Z90710 Acquired absence of both cervix and uterus: Secondary | ICD-10-CM | POA: Diagnosis not present

## 2023-10-07 DIAGNOSIS — J45909 Unspecified asthma, uncomplicated: Secondary | ICD-10-CM | POA: Insufficient documentation

## 2023-10-07 DIAGNOSIS — N179 Acute kidney failure, unspecified: Secondary | ICD-10-CM

## 2023-10-07 LAB — CBC WITH DIFFERENTIAL/PLATELET
Abs Immature Granulocytes: 0.02 10*3/uL (ref 0.00–0.07)
Basophils Absolute: 0 10*3/uL (ref 0.0–0.1)
Basophils Relative: 1 %
Eosinophils Absolute: 0.2 10*3/uL (ref 0.0–0.5)
Eosinophils Relative: 3 %
HCT: 32.1 % — ABNORMAL LOW (ref 36.0–46.0)
Hemoglobin: 9.8 g/dL — ABNORMAL LOW (ref 12.0–15.0)
Immature Granulocytes: 0 %
Lymphocytes Relative: 37 %
Lymphs Abs: 2.2 10*3/uL (ref 0.7–4.0)
MCH: 31.3 pg (ref 26.0–34.0)
MCHC: 30.5 g/dL (ref 30.0–36.0)
MCV: 102.6 fL — ABNORMAL HIGH (ref 80.0–100.0)
Monocytes Absolute: 0.4 10*3/uL (ref 0.1–1.0)
Monocytes Relative: 7 %
Neutro Abs: 3.2 10*3/uL (ref 1.7–7.7)
Neutrophils Relative %: 52 %
Platelets: 171 10*3/uL (ref 150–400)
RBC: 3.13 MIL/uL — ABNORMAL LOW (ref 3.87–5.11)
RDW: 13.8 % (ref 11.5–15.5)
WBC: 6.1 10*3/uL (ref 4.0–10.5)
nRBC: 0 % (ref 0.0–0.2)

## 2023-10-07 LAB — CMP (CANCER CENTER ONLY)
ALT: 18 U/L (ref 0–44)
AST: 13 U/L — ABNORMAL LOW (ref 15–41)
Albumin: 4 g/dL (ref 3.5–5.0)
Alkaline Phosphatase: 89 U/L (ref 38–126)
Anion gap: 4 — ABNORMAL LOW (ref 5–15)
BUN: 21 mg/dL — ABNORMAL HIGH (ref 6–20)
CO2: 29 mmol/L (ref 22–32)
Calcium: 9.9 mg/dL (ref 8.9–10.3)
Chloride: 107 mmol/L (ref 98–111)
Creatinine: 0.82 mg/dL (ref 0.44–1.00)
GFR, Estimated: >60 mL/min (ref >60)
Glucose, Bld: 88 mg/dL (ref 70–99)
Potassium: 4.5 mmol/L (ref 3.5–5.1)
Sodium: 140 mmol/L (ref 135–145)
Total Bilirubin: 0.4 mg/dL (ref 0.0–1.2)
Total Protein: 7.1 g/dL (ref 6.5–8.1)

## 2023-10-07 LAB — SAMPLE TO BLOOD BANK

## 2023-10-07 MED ORDER — FERROUS GLUCONATE 324 (38 FE) MG PO TABS
324.0000 mg | ORAL_TABLET | Freq: Two times a day (BID) | ORAL | 3 refills | Status: AC
  Filled 2023-10-07: qty 180, 90d supply, fill #0
  Filled 2023-11-25: qty 100, 50d supply, fill #0

## 2023-10-07 NOTE — Progress Notes (Signed)
[facility]   [phone] [phone]   Clinic Follow up Note   Patient Care Team: Ivonne Andrew, NP as PCP - General (Pulmonary Disease) [name], MD as PCP - Cardiology (Cardiology) Heidi Dach, RN as Case Manager Shaune Leeks as Social Worker Malachy Mood, MD as Consulting Physician (Hematology)  Date of Service:  10/07/2023  CHIEF COMPLAINT: f/u of anemia   CURRENT THERAPY:  Oral ferric gluconate   Assessment and Plan    Anemia of Chronic Disease Anemia of chronic disease, likely related to underlying kidney issues. Hemoglobin improved to 9.8 g/dL. Bone marrow biopsy and molecular testing from October 2024 were negative for leukemia or pre-leukemia. Discussed continuing iron supplementation and monitoring iron levels to avoid overload. Patient prefers to avoid future bone marrow biopsies due to previous discomfort. - Continue current management - Prescribe ferrous gluconate, 1 year supply, to be taken twice daily - Monitor iron levels to avoid overload - Schedule follow-up in one year  Obesity Target weight of 218 lbs. Reports stress eating and recent weight gain. Discussed potential use of weight loss medications with primary care physician. Patient concerned about impact of weight loss medications on blood work. - Discuss weight loss medications with primary care physician in March - Continue dietary modifications and physical activity  Osteoarthritis Uses a cane for mobility. Declined knee injections, focusing on physical therapy and self-management. Needs a new cane as the current one is old and borrowed. - Continue physical therapy - Assist in obtaining a new cane through insurance  Plan -Continue oral iron, I refilled for her -Lab every 4 months - Schedule follow-up appointment in one year for anemia management.       Discussed the use of AI scribe software for clinical note transcription with the patient, who  gave verbal consent to proceed.  History of Present Illness   The patient, a 58 year old female with a history of anemia, presents for a follow-up visit. She reports feeling great and has been actively trying to lose weight. She mentions that she has been stressed, which may have led to some weight gain. She also discusses her arthritis and the use of a cane for mobility. She expresses a desire to get a new cane as her current one is old and worn out. She also mentions that she has been taking ferrous gluconate for her anemia and believes it has been helping her.         All other systems were reviewed with the patient and are negative.  MEDICAL HISTORY:  Past Medical History:  Diagnosis Date   Anemia    Asthma    Depression    no meds   Depression    Eczema    Fibroid    Headache(784.0)    otc prn med   History of blood transfusion    Hypertension    Knee pain, bilateral    arthralgia knee pain bilateral   Lower extremity edema    Mitral regurgitation    Mild to moderate by echo 07/2023   Seasonal allergies    Sleep apnea    SVD (spontaneous vaginal delivery)    x 3   Vaginal polyp 05/2020    SURGICAL HISTORY: Past Surgical History:  Procedure Laterality Date   ABDOMINAL HYSTERECTOMY     BILATERAL SALPINGECTOMY Bilateral 10/10/2017   Procedure: BILATERAL SALPINGECTOMY;  Surgeon: Hermina Staggers, MD;  Location: [facility];  Service: Gynecology;  Laterality: Bilateral;  BREAST BIOPSY     US guided core 2009   BREAST EXCISIONAL BIOPSY     right 1982 left 1981   BREAST SURGERY     bilateral cysts/benign   COLONOSCOPY  10/17/2021   VAGINAL HYSTERECTOMY N/A 10/10/2017   Procedure: HYSTERECTOMY VAGINAL;  Surgeon: Hermina Staggers, MD;  Location: [facility];  Service: Gynecology;  Laterality: N/A;    I have reviewed the social history and family history with the patient and they are unchanged from previous note.  ALLERGIES:  has no known allergies.  MEDICATIONS:  Current  Outpatient Medications  Medication Sig Dispense Refill   acetaminophen (TYLENOL) 325 MG tablet Take 2 tablets (650 mg total) by mouth daily as needed for moderate pain (pain score 4-6), headache or fever. 100 tablet 0   albuterol (VENTOLIN HFA) 108 (90 Base) MCG/ACT inhaler Inhale 1 puff every 6 hours as needed for shortness of breath 18 g 5   aspirin (ASPIRIN LOW DOSE) 81 MG chewable tablet Chew 1 tablet (81 mg total) by mouth daily. 30 tablet 2   buPROPion (WELLBUTRIN XL) 150 MG 24 hr tablet Take 1 tablet (150 mg total) by mouth daily. 30 tablet 3   carvedilol (COREG) 25 MG tablet Take 1 tablet (25 mg total) by mouth 2 (two) times daily. 180 tablet 3   cetirizine (ZYRTEC) 10 MG tablet Take 1 tablet (10 mg total) by mouth daily. 30 tablet 6   ciclopirox (LOPROX) 0.77 % cream Apply topically 2 (two) times daily. 15 g 0   cyclobenzaprine (FLEXERIL) 5 MG tablet Take 1 tablet (5 mg total) by mouth 3 (three) times daily as needed for muscle spasms. 30 tablet 0   diclofenac (VOLTAREN) 75 MG EC tablet Take 1 tablet (75 mg total) by mouth 2 (two) times daily. 30 tablet 0   diclofenac Sodium (VOLTAREN) 1 % GEL Apply 4 g topically 4 (four) times daily. 100 g 3   docusate sodium (COLACE) 100 MG capsule Take 1 capsule (100 mg total) by mouth 2 (two) times daily as needed. 30 capsule 2   ferrous gluconate (FERGON) 324 MG tablet Take 1 tablet (324 mg total) by mouth 2 (two) times daily with a meal. 180 tablet 3   fluticasone (FLONASE) 50 MCG/ACT nasal spray Place 1 spray into both nostrils daily. (Patient taking differently: Place 1 spray into both nostrils daily as needed for allergies.) 16 g 2   furosemide (LASIX) 20 MG tablet Take 1 tablet (20 mg total) by mouth daily. 30 tablet 6   hydrocortisone 2.5 % cream Apply topically 2 (two) times daily. (Patient taking differently: Apply 1 Application topically daily as needed (itching).) 30 g 1   hydrOXYzine (ATARAX) 25 MG tablet Take 1 tablet (25 mg total) by mouth  3 (three) times daily as needed. 30 tablet 11   ibuprofen (ADVIL) 800 MG tablet Take 1 tablet (800 mg total) by mouth 3 (three) times daily. 30 tablet 6   ipratropium-albuterol (DUONEB) 0.5-2.5 (3) MG/3ML SOLN Take 3 mLs by nebulization every 6 (six) hours as needed. 360 mL 0   linaclotide (LINZESS) 72 MCG capsule Take 1 capsule (72 mcg total) by mouth daily before breakfast. 30 capsule 0   meloxicam (MOBIC) 15 MG tablet Take 1 tablet (15 mg total) by mouth daily as needed for pain. 90 tablet 3   methocarbamol (ROBAXIN-750) 750 MG tablet Take 1 tablet (750 mg total) by mouth 4 (four) times daily. 120 tablet 3   nitroGLYCERIN (NITROSTAT) 0.4 MG  SL tablet Place 1 tablet (0.4 mg total) under the tongue every 5 (five) minutes as needed for chest pain (max 3 doses, if no relief call 911). 25 tablet 5   NONFORMULARY OR COMPOUNDED ITEM Diclofenac 3%, Gabapentin 5%, Lidocaine 5%, Menthol 1%.  Apply 1-2 pumps three to four times per day as needed 1 each 0   olmesartan (BENICAR) 40 MG tablet Take 1 tablet (40 mg total) by mouth daily. 90 tablet 3   Respiratory Therapy Supplies (NEBULIZER MASK ADULT) MISC Use this mask with your nebulizer. 1 each 0   spironolactone (ALDACTONE) 100 MG tablet Take 1 tablet (100 mg total) by mouth daily. 90 tablet 3   triamcinolone cream (KENALOG) 0.5 % Apply 1 Application topically 3 (three) times daily. 30 g 0   Vitamin D, Ergocalciferol, (DRISDOL) 1.25 MG (50000 UNIT) CAPS capsule Take 1 capsule (50,000 Units total) by mouth every 7 (seven) days. 12 capsule 0   Current Facility-Administered Medications  Medication Dose Route Frequency Provider Last Rate Last Admin   Triamcinolone Acetonide (ZILRETTA) intra-articular injection 32 mg  32 mg Intra-articular Once Raulkar, Drema Pry, MD       Triamcinolone Acetonide (ZILRETTA) intra-articular injection 32 mg  32 mg Intra-articular Once Raulkar, Drema Pry, MD        PHYSICAL EXAMINATION: ECOG PERFORMANCE STATUS: 1 - Symptomatic  but completely ambulatory  Vitals:   10/07/23 1033  BP: (!) 179/85  Pulse: 85  Resp: 17  Temp: (!) 97.2 F (36.2 C)  SpO2: 100%   Wt Readings from Last 3 Encounters:  10/07/23 246 lb 12.8 oz (111.9 kg)  06/14/23 245 lb 3.2 oz (111.2 kg)  06/06/23 240 lb (108.9 kg)     GENERAL:alert, no distress and comfortable SKIN: skin color, texture, turgor are normal, no rashes or significant lesions EYES: normal, Conjunctiva are pink and non-injected, sclera clear Musculoskeletal:no cyanosis of digits and no clubbing  NEURO: alert & oriented x 3 with fluent speech, no focal motor/sensory deficits    LABORATORY DATA:  I have reviewed the data as listed    Latest Ref Rng & Units 10/07/2023   10:12 AM 08/05/2023   10:06 AM 06/06/2023    8:24 AM  CBC  WBC 4.0 - 10.5 K/uL 6.1  5.0  5.8   Hemoglobin 12.0 - 15.0 g/dL 9.8  81.1  9.8   Hematocrit 36.0 - 46.0 % 32.1  32.2  32.5   Platelets 150 - 400 K/uL 171  136  143         Latest Ref Rng & Units 10/07/2023   10:12 AM 08/05/2023   10:06 AM 05/14/2023    9:14 AM  CMP  Glucose 70 - 99 mg/dL 88  95  914   BUN 6 - 20 mg/dL 21  37  14   Creatinine 0.44 - 1.00 mg/dL 7.82  9.56  2.13   Sodium 135 - 145 mmol/L 140  139  142   Potassium 3.5 - 5.1 mmol/L 4.5  4.4  4.0   Chloride 98 - 111 mmol/L 107  108  110   CO2 22 - 32 mmol/L 29  28  28    Calcium 8.9 - 10.3 mg/dL 9.9  08.6  9.9   Total Protein 6.5 - 8.1 g/dL 7.1  6.9  7.0   Total Bilirubin 0.0 - 1.2 mg/dL 0.4  0.6  0.5   Alkaline Phos 38 - 126 U/L 89  95  94   AST 15 - 41 U/L  13  13  15    ALT 0 - 44 U/L 18  15  17        RADIOGRAPHIC STUDIES: I have personally reviewed the radiological images as listed and agreed with the findings in the report. No results found.    Orders Placed This Encounter  Procedures   Ferritin    Standing Status:   Standing    Number of Occurrences:   10    Expiration Date:   10/06/2024   All questions were answered. The patient knows to call the  clinic with any problems, questions or concerns. No barriers to learning was detected. The total time spent in the appointment was 20 minutes.     Malachy Mood, MD 10/07/2023

## 2023-10-08 ENCOUNTER — Other Ambulatory Visit: Payer: Self-pay

## 2023-10-28 ENCOUNTER — Other Ambulatory Visit: Payer: Self-pay

## 2023-10-28 ENCOUNTER — Other Ambulatory Visit: Payer: Self-pay | Admitting: Nurse Practitioner

## 2023-10-28 DIAGNOSIS — L309 Dermatitis, unspecified: Secondary | ICD-10-CM

## 2023-10-28 MED ORDER — OLMESARTAN MEDOXOMIL 40 MG PO TABS
40.0000 mg | ORAL_TABLET | Freq: Every day | ORAL | 3 refills | Status: AC
  Filled 2023-10-28: qty 90, 90d supply, fill #0
  Filled 2024-01-27: qty 90, 90d supply, fill #1
  Filled 2024-06-19: qty 90, 90d supply, fill #2

## 2023-10-28 MED ORDER — CYCLOBENZAPRINE HCL 5 MG PO TABS
5.0000 mg | ORAL_TABLET | Freq: Three times a day (TID) | ORAL | 0 refills | Status: DC | PRN
  Filled 2023-10-28: qty 30, 10d supply, fill #0

## 2023-10-28 MED ORDER — HYDROCORTISONE 2.5 % EX CREA
TOPICAL_CREAM | Freq: Two times a day (BID) | CUTANEOUS | 1 refills | Status: AC
  Filled 2023-10-28: qty 30, 30d supply, fill #0
  Filled 2023-11-25: qty 30, 30d supply, fill #1

## 2023-10-31 ENCOUNTER — Other Ambulatory Visit: Payer: Self-pay

## 2023-11-07 DIAGNOSIS — M25562 Pain in left knee: Secondary | ICD-10-CM | POA: Diagnosis not present

## 2023-11-07 DIAGNOSIS — M25561 Pain in right knee: Secondary | ICD-10-CM | POA: Diagnosis not present

## 2023-11-07 DIAGNOSIS — M170 Bilateral primary osteoarthritis of knee: Secondary | ICD-10-CM | POA: Diagnosis not present

## 2023-11-11 ENCOUNTER — Other Ambulatory Visit: Payer: Self-pay
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
## 2023-11-13 ENCOUNTER — Other Ambulatory Visit: Payer: Self-pay

## 2023-11-13 ENCOUNTER — Encounter (HOSPITAL_COMMUNITY): Payer: Self-pay | Admitting: Psychiatry

## 2023-11-13 ENCOUNTER — Ambulatory Visit (INDEPENDENT_AMBULATORY_CARE_PROVIDER_SITE_OTHER): Payer: 59 | Admitting: Psychiatry

## 2023-11-13 VITALS — BP 162/98 | HR 85 | Temp 97.8°F | Ht 60.0 in | Wt 243.0 lb

## 2023-11-13 DIAGNOSIS — G4709 Other insomnia: Secondary | ICD-10-CM | POA: Diagnosis not present

## 2023-11-13 DIAGNOSIS — E538 Deficiency of other specified B group vitamins: Secondary | ICD-10-CM | POA: Diagnosis not present

## 2023-11-13 DIAGNOSIS — F330 Major depressive disorder, recurrent, mild: Secondary | ICD-10-CM

## 2023-11-13 DIAGNOSIS — E559 Vitamin D deficiency, unspecified: Secondary | ICD-10-CM | POA: Diagnosis not present

## 2023-11-13 DIAGNOSIS — F4323 Adjustment disorder with mixed anxiety and depressed mood: Secondary | ICD-10-CM | POA: Insufficient documentation

## 2023-11-13 DIAGNOSIS — G4700 Insomnia, unspecified: Secondary | ICD-10-CM | POA: Insufficient documentation

## 2023-11-13 DIAGNOSIS — Z62819 Personal history of unspecified abuse in childhood: Secondary | ICD-10-CM

## 2023-11-13 DIAGNOSIS — T1490XA Injury, unspecified, initial encounter: Secondary | ICD-10-CM | POA: Insufficient documentation

## 2023-11-13 MED ORDER — BUPROPION HCL ER (XL) 150 MG PO TB24
150.0000 mg | ORAL_TABLET | Freq: Every day | ORAL | 2 refills | Status: DC
  Filled 2023-11-13: qty 30, 30d supply, fill #0
  Filled 2023-12-31 (×2): qty 30, 30d supply, fill #1

## 2023-11-15 ENCOUNTER — Other Ambulatory Visit: Payer: Self-pay

## 2023-11-15 ENCOUNTER — Ambulatory Visit: Payer: Self-pay

## 2023-11-15 ENCOUNTER — Telehealth: Payer: Self-pay

## 2023-11-15 DIAGNOSIS — G8929 Other chronic pain: Secondary | ICD-10-CM

## 2023-11-15 NOTE — Telephone Encounter (Signed)
 Pt requesting flexeril, diclofenac, tylenol refills, as well as referral to pain clinic. Per pharm personnel Mikayla-diclofenac is needing a new rx, flexeril was picked up after the 3/10 refill.   Chief Complaint: bilateral knee pain, known arthritis Symptoms: pain, increasing, has been walking and doing PT Frequency: ongoing issue Pertinent Negatives: Patient denies fever, redness, SOB Disposition: [] ED /[] Urgent Care (no appt availability in office) / [] Appointment(In office/virtual)/ []  [place] [facility]/ [] Home Care/ [] Refused Recommended Disposition /[] [place] [facility]/ [x]  Follow-up with PCP  Additional Notes: pt states that her arthritis in bilateral knees is getting worse. Pt states that flexeril was helping as was diclofenac. Pt states that she has not gotten these refilled despite the request. Pt states that she has been seeing PT and ortho for her pain, she was told to contact her PCP about a pain clinic referral and the pain medication request. Pt states that she has been walking more as of recently and is unsure if that is making the pain worse. Pt states that it is 10/10. Denies redness, fever, SOB. States minimal swelling.   Per chart flexeril was written on 3/10, filled on 3/13 and was pick up. Attempted to reach out to pt to let know that per pharm that medication has been picked up, states number in chart is invalid, no ability to leave a VM.   Copied from CRM [id number]. Topic: Clinical - Red Word Triage >> Nov 15, 2023 11:11 AM Nyra Capes wrote: Red Word that prompted transfer to Nurse Triage: Patient called in, has knee pain both knees, 11 pain scale, patient says it's bone on bone.    wanting to schedule medication refill appt  and a referral to pain clinic Reason for Disposition  Knee pain is a chronic symptom (recurrent or ongoing AND present > 4 weeks)  Answer Assessment - Initial Assessment Questions 1. LOCATION and RADIATION: "Where is the pain located?"       Bilateral knee pain, known arthritis 2. QUALITY: "What does the pain feel like?"  (e.g., sharp, dull, aching, burning)     Sharp burning 3. SEVERITY: "How bad is the pain?" "What does it keep you from doing?"   (Scale 1-10; or mild, moderate, severe)   -  MILD (1-3): doesn't interfere with normal activities    -  MODERATE (4-7): interferes with normal activities (e.g., work or school) or awakens from sleep, limping    -  SEVERE (8-10): excruciating pain, unable to do any normal activities, unable to walk     10 4. ONSET: "When did the pain start?" "Does it come and go, or is it there all the time?"     Ongoing, been doing a lot of walking and PT 5. RECURRENT: "Have you had this pain before?" If Yes, ask: "When, and what happened then?"     Yes, known arthritis 6. SETTING: "Has there been any recent work, exercise or other activity that involved that part of the body?"      More walking, PT 7. AGGRAVATING FACTORS: "What makes the knee pain worse?" (e.g., walking, climbing stairs, running)     movement 8. ASSOCIATED SYMPTOMS: "Is there any swelling or redness of the knee?"     Minimal swelling bilaterally 9. OTHER SYMPTOMS: "Do you have any other symptoms?" (e.g., chest pain, difficulty breathing, fever, calf pain)     denies  Protocols used: Knee Pain-A-AH

## 2023-11-17 MED ORDER — DICLOFENAC SODIUM 75 MG PO TBEC
75.0000 mg | DELAYED_RELEASE_TABLET | Freq: Two times a day (BID) | ORAL | 0 refills | Status: DC
  Filled 2023-11-17: qty 30, 15d supply, fill #0

## 2023-11-17 MED ORDER — CYCLOBENZAPRINE HCL 5 MG PO TABS
5.0000 mg | ORAL_TABLET | Freq: Three times a day (TID) | ORAL | 0 refills | Status: DC | PRN
  Filled 2023-11-17: qty 30, 10d supply, fill #0

## 2023-11-17 MED ORDER — ACETAMINOPHEN 325 MG PO TABS
650.0000 mg | ORAL_TABLET | Freq: Every day | ORAL | 0 refills | Status: DC | PRN
  Filled 2023-11-17: qty 100, 50d supply, fill #0

## 2023-11-18 ENCOUNTER — Other Ambulatory Visit: Payer: Self-pay

## 2023-11-19 DIAGNOSIS — I10 Essential (primary) hypertension: Secondary | ICD-10-CM | POA: Diagnosis not present

## 2023-11-19 DIAGNOSIS — G4733 Obstructive sleep apnea (adult) (pediatric): Secondary | ICD-10-CM | POA: Diagnosis not present

## 2023-11-19 NOTE — Telephone Encounter (Signed)
 Sent to [employer]

## 2023-11-20 ENCOUNTER — Other Ambulatory Visit: Payer: Self-pay

## 2023-11-25 ENCOUNTER — Other Ambulatory Visit: Payer: Self-pay | Admitting: Physical Medicine and Rehabilitation

## 2023-11-25 ENCOUNTER — Other Ambulatory Visit: Payer: Self-pay

## 2023-11-25 ENCOUNTER — Other Ambulatory Visit: Payer: Self-pay | Admitting: Nurse Practitioner

## 2023-11-25 DIAGNOSIS — K5909 Other constipation: Secondary | ICD-10-CM

## 2023-11-25 DIAGNOSIS — R52 Pain, unspecified: Secondary | ICD-10-CM

## 2023-11-25 MED ORDER — CICLOPIROX OLAMINE 0.77 % EX CREA
TOPICAL_CREAM | Freq: Two times a day (BID) | CUTANEOUS | 0 refills | Status: AC
  Filled 2023-11-25: qty 15, 7d supply, fill #0

## 2023-11-25 MED ORDER — LINACLOTIDE 72 MCG PO CAPS
72.0000 ug | ORAL_CAPSULE | Freq: Every day | ORAL | 0 refills | Status: DC
  Filled 2023-11-25: qty 30, 30d supply, fill #0

## 2023-11-25 NOTE — Telephone Encounter (Signed)
 Please advise [facility]

## 2023-11-26 ENCOUNTER — Other Ambulatory Visit: Payer: Self-pay

## 2023-11-27 ENCOUNTER — Other Ambulatory Visit: Payer: Self-pay

## 2023-11-28 ENCOUNTER — Ambulatory Visit: Payer: 59 | Attending: Cardiology | Admitting: Cardiology

## 2023-11-28 ENCOUNTER — Encounter: Payer: Self-pay | Admitting: Cardiology

## 2023-11-28 ENCOUNTER — Other Ambulatory Visit: Payer: Self-pay

## 2023-11-28 VITALS — BP 116/78 | HR 78 | Ht 60.0 in | Wt 248.0 lb

## 2023-11-28 DIAGNOSIS — R600 Localized edema: Secondary | ICD-10-CM

## 2023-11-28 DIAGNOSIS — G4733 Obstructive sleep apnea (adult) (pediatric): Secondary | ICD-10-CM | POA: Diagnosis not present

## 2023-11-28 DIAGNOSIS — I10 Essential (primary) hypertension: Secondary | ICD-10-CM

## 2023-11-28 DIAGNOSIS — I77810 Thoracic aortic ectasia: Secondary | ICD-10-CM

## 2023-11-28 DIAGNOSIS — I340 Nonrheumatic mitral (valve) insufficiency: Secondary | ICD-10-CM | POA: Diagnosis not present

## 2023-11-28 NOTE — Addendum Note (Signed)
 Addended by: Bertram Millard on: 11/28/2023 10:55 AM   Modules accepted: Orders

## 2023-11-28 NOTE — Progress Notes (Signed)
 Cardiology  Note    Date:  11/28/2023   ID:  Caroline Ryan, DOB Feb 04, 1966, MRN [id number]  PCP:  Caroline Andrew, NP  Cardiologist:  Armanda Magic, MD   Chief Complaint  Patient presents with   Hypertension   Sleep Apnea   Mitral Regurgitation     History of Present Illness:  Caroline Ryan is a 58 y.o. female  with a hx of asthma, anemia and HTN and LE edema . She was found to have an abnormal EKG with LVH by voltage and LAFB.  She has been having problems with LE edema since she started having problems with her knees.  She was started on Lasix 20mg  daily which has helped with her edema.  She is on Gabapentin and the dose was increased  and her LE edema has persisted.  She also has issues with blood pressure being poorly controlled and her lisinopril HCT was stopped.  She was started on lisinopril 40 mg daily and continued on Lasix 20 mg daily.  I  recommended she come off gabapentin which could have been contributing to her edema.   2D echo done 08/03/2021 showed normal LV function with EF 60 to 65% with mildly dilated LV normal diastolic function, mild to moderate MR and borderline dilated aortic root at 38 mm.  2D echo done 08/08/2022 showed EF 55 to 60% with mild LVH, G1DD, mild left atrial enlargement, mild MR and mildly dilated ascending aorta at 40 mm.  She underwent outpatient sleep study on 09/24/2021 which demonstrated moderate obstructive sleep apnea with an AHI of 22/h and severe oxygen desaturations as low as 56%.  She underwent CPAP titration on 11/09/2021 to 16 cm H2O.    She is here today for followup and is doing well.  She has had some mild DOE with recent allergies and pollen.  She denies any chest pain or pressure, PND, orthopnea, LE edema, dizziness or syncope. She is compliant with her meds and is tolerating meds with no SE.    She is doing well with her PAP device and thinks that she has gotten used to it.  She uses it every night and sometimes in the  afternoon. She tolerates the nasal mask and feels the pressure is adequate.  Since going on PAP she feels rested in the am and has no significant daytime sleepiness.  She denies any significant mouth or nasal dryness or nasal congestion.  She does not think that he snores.      Past Medical History:  Diagnosis Date   Anemia    Asthma    Depression    no meds   Depression    Eczema    Fibroid    Headache(784.0)    otc prn med   History of blood transfusion    Hypertension    Knee pain, bilateral    arthralgia knee pain bilateral   Lower extremity edema    Mitral regurgitation    Mild to moderate by echo 07/2023   Seasonal allergies    Sleep apnea    SVD (spontaneous vaginal delivery)    x 3   Vaginal polyp 05/2020    Past Surgical History:  Procedure Laterality Date   ABDOMINAL HYSTERECTOMY     BILATERAL SALPINGECTOMY Bilateral 10/10/2017   Procedure: BILATERAL SALPINGECTOMY;  Surgeon: Hermina Staggers, MD;  Location: [facility];  Service: Gynecology;  Laterality: Bilateral;   BREAST BIOPSY     US guided core 2009  BREAST EXCISIONAL BIOPSY     right 1982 left 1981   BREAST SURGERY     bilateral cysts/benign   COLONOSCOPY  10/17/2021   VAGINAL HYSTERECTOMY N/A 10/10/2017   Procedure: HYSTERECTOMY VAGINAL;  Surgeon: Hermina Staggers, MD;  Location: [facility];  Service: Gynecology;  Laterality: N/A;    Current Medications: Current Meds  Medication Sig   acetaminophen (TYLENOL) 325 MG tablet Take 2 tablets (650 mg total) by mouth daily as needed for moderate pain (pain score 4-6), headache or fever.   albuterol (VENTOLIN HFA) 108 (90 Base) MCG/ACT inhaler Inhale 1 puff every 6 hours as needed for shortness of breath   aspirin (ASPIRIN LOW DOSE) 81 MG chewable tablet Chew 1 tablet (81 mg total) by mouth daily.   buPROPion (WELLBUTRIN XL) 150 MG 24 hr tablet Take 1 tablet (150 mg total) by mouth daily.   carvedilol (COREG) 25 MG tablet Take 1 tablet (25 mg total) by mouth 2 (two)  times daily.   cetirizine (ZYRTEC) 10 MG tablet Take 1 tablet (10 mg total) by mouth daily.   ciclopirox (LOPROX) 0.77 % cream Apply topically 2 (two) times daily.   cyclobenzaprine (FLEXERIL) 5 MG tablet Take 1 tablet (5 mg total) by mouth 3 (three) times daily as needed for muscle spasms.   diclofenac (VOLTAREN) 75 MG EC tablet Take 1 tablet (75 mg total) by mouth 2 (two) times daily.   diclofenac Sodium (VOLTAREN) 1 % GEL Apply 4 g topically 4 (four) times daily.   docusate sodium (COLACE) 100 MG capsule Take 1 capsule (100 mg total) by mouth 2 (two) times daily as needed.   ferrous gluconate (FERGON) 324 MG tablet Take 1 tablet (324 mg total) by mouth 2 (two) times daily with a meal.   fluticasone (FLONASE) 50 MCG/ACT nasal spray Place 1 spray into both nostrils daily. (Patient taking differently: Place 1 spray into both nostrils daily as needed for allergies.)   hydrocortisone 2.5 % cream Apply topically 2 (two) times daily.   hydrOXYzine (ATARAX) 25 MG tablet Take 1 tablet (25 mg total) by mouth 3 (three) times daily as needed.   ibuprofen (ADVIL) 800 MG tablet Take 1 tablet (800 mg total) by mouth 3 (three) times daily.   ipratropium-albuterol (DUONEB) 0.5-2.5 (3) MG/3ML SOLN Take 3 mLs by nebulization every 6 (six) hours as needed.   linaclotide (LINZESS) 72 MCG capsule Take 1 capsule (72 mcg total) by mouth daily before breakfast.   meloxicam (MOBIC) 15 MG tablet Take 1 tablet (15 mg total) by mouth daily as needed for pain.   methocarbamol (ROBAXIN-750) 750 MG tablet Take 1 tablet (750 mg total) by mouth 4 (four) times daily.   nitroGLYCERIN (NITROSTAT) 0.4 MG SL tablet Place 1 tablet (0.4 mg total) under the tongue every 5 (five) minutes as needed for chest pain (max 3 doses, if no relief call 911).   NONFORMULARY OR COMPOUNDED ITEM Diclofenac 3%, Gabapentin 5%, Lidocaine 5%, Menthol 1%.  Apply 1-2 pumps three to four times per day as needed   olmesartan (BENICAR) 40 MG tablet Take 1  tablet (40 mg total) by mouth daily.   Respiratory Therapy Supplies (NEBULIZER MASK ADULT) MISC Use this mask with your nebulizer.   spironolactone (ALDACTONE) 100 MG tablet Take 1 tablet (100 mg total) by mouth daily.   triamcinolone cream (KENALOG) 0.5 % Apply 1 Application topically 3 (three) times daily.   Vitamin D, Ergocalciferol, (DRISDOL) 1.25 MG (50000 UNIT) CAPS capsule Take 1 capsule (50,000  Units total) by mouth every 7 (seven) days.   Current Facility-Administered Medications for the 11/28/23 encounter (Office Visit) with [name], MD  Medication   Triamcinolone Acetonide (ZILRETTA) intra-articular injection 32 mg   Triamcinolone Acetonide (ZILRETTA) intra-articular injection 32 mg    Allergies:   Patient has no known allergies.   Social History   Socioeconomic History   Marital status: Single    Spouse name: Not on file   Number of children: 3   Years of education: Not on file   Highest education level: Not on file  Occupational History   Not on file  Tobacco Use   Smoking status: Former    Current packs/day: 0.00    Average packs/day: 0.3 packs/day for 2.0 years (0.5 ttl pk-yrs)    Types: Cigarettes    Start date: 08/20/2012    Quit date: 08/20/2014    Years since quitting: 9.2    Passive exposure: Never   Smokeless tobacco: Never  Vaping Use   Vaping status: Never Used  Substance and Sexual Activity   Alcohol use: Yes    Alcohol/week: 2.0 standard drinks of alcohol    Types: 2 Glasses of wine per week    Comment: once a week   Drug use: No   Sexual activity: Yes    Birth control/protection: Condom  Other Topics Concern   Not on file  Social History Narrative   Not on file   Social Drivers of Health   Financial Resource Strain: High Risk (08/21/2022)   Overall Financial Resource Strain (CARDIA)    Difficulty of Paying Living Expenses: Very hard  Food Insecurity: Food Insecurity Present (08/21/2022)   Hunger Vital Sign    Worried About Running Out  of Food in the Last Year: Often true    Ran Out of Food in the Last Year: Often true  Transportation Needs: No Transportation Needs (08/21/2022)   PRAPARE - Administrator, Civil Service (Medical): No    Lack of Transportation (Non-Medical): No  Physical Activity: Inactive (08/21/2022)   Exercise Vital Sign    Days of Exercise per Week: 0 days    Minutes of Exercise per Session: 0 min  Stress: Stress Concern Present (07/27/2022)   [employer] of Occupational Health - Occupational Stress Questionnaire    Feeling of Stress : Very much  Social Connections: Socially Isolated (08/21/2022)   Social Connection and Isolation Panel [NHANES]    Frequency of Communication with Friends and Family: More than three times a week    Frequency of Social Gatherings with Friends and Family: Never    Attends Religious Services: Never    Database administrator or Organizations: No    Attends Engineer, structural: Never    Marital Status: Never married     Family History:  The patient's family history includes Breast cancer (age of onset: 68) in her mother; Diabetes in her brother, brother, and father; Hypertension in her father.   ROS:   Please see the history of present illness.    ROS All other systems reviewed and are negative.      No data to display           PHYSICAL EXAM:   VS:  BP 116/78   Pulse 78   Ht 5' (1.524 m)   Wt 248 lb (112.5 kg)   LMP 04/03/2017 (Approximate)   SpO2 97%   BMI 48.43 kg/m    GEN: Well nourished, well developed in  no acute distress HEENT: Normal NECK: No JVD; No carotid bruits LYMPHATICS: No lymphadenopathy CARDIAC:RRR, no murmurs, rubs, gallops RESPIRATORY:  Clear to auscultation without rales, wheezing or rhonchi  ABDOMEN: Soft, non-tender, non-distended MUSCULOSKELETAL:  No edema; No deformity  SKIN: Warm and dry NEUROLOGIC:  Alert and oriented x 3 PSYCHIATRIC:  Normal affect  Wt Readings from Last 3 Encounters:  11/28/23 248  lb (112.5 kg)  10/07/23 246 lb 12.8 oz (111.9 kg)  06/14/23 245 lb 3.2 oz (111.2 kg)      Studies/Labs Reviewed:   EKG Interpretation Date/Time:  Thursday November 28 2023 10:30:33 EDT Ventricular Rate:  78 PR Interval:  168 QRS Duration:  104 QT Interval:  374 QTC Calculation: 426 R Axis:   -16  Text Interpretation: Normal sinus rhythm Left anterior fasicular block Moderate voltage criteria for LVH, may be normal variant ( R in aVL , Cornell product ) When compared with ECG of 23-Oct-2022 11:21, PREVIOUS ECG IS PRESENT Since last tracing rate slower Confirmed by Armanda Magic ([id number]) on 11/28/2023 10:48:06 AM   Recent Labs: 02/27/2023: TSH 2.710 10/07/2023: ALT 18; BUN 21; Creatinine 0.82; Hemoglobin 9.8; Platelets 171; Potassium 4.5; Sodium 140   Lipid Panel    Component Value Date/Time   CHOL 186 02/27/2023 0924   TRIG 79 02/27/2023 0924   HDL 55 02/27/2023 0924   CHOLHDL 3.4 02/27/2023 0924   CHOLHDL 2.0 06/27/2015 1216   VLDL 9 06/27/2015 1216   LDLCALC 116 (H) 02/27/2023 0924    Additional studies/ records that were reviewed today include:  OV notes from PCP    ASSESSMENT:    1. Leg edema   2. Essential hypertension, benign   3. OSA (obstructive sleep apnea)   4. Nonrheumatic mitral valve regurgitation   5. Ascending aorta dilation (HCC)       PLAN:  In order of problems listed above:  LE edema -I suspect this is multifactorial related to morbid obesity, gabapentin side effect and dietary indiscretion with Na intake as well as intermittent NSAID use -dx recently with early lymphedema -2D echo 08/08/2021 showed normal LV function with normal diastolic function and mild to moderate MR -repeat echo 08/08/2022 showed EF 55 to 60% with mild LVH , G1DD, mild LAE, mild MR and mildly dilated aortic root and ascending a aorta at 40 mm -She is not having any significant problems with lower extremity edema on exam today -encouraged her to follow a < 2gm Na  diet -Continue drug management with Lasix 20 mg daily with as needed refills -I have personally reviewed and interpreted outside labs performed by patient's PCP which showed serum creatinine 0.82 and potassium 4.5 on 10/07/2023  HTN -BP is adequate controlled on exam today -Continue prescription drug management with carvedilol 25 mg twice daily and olmesartan 40 mg daily as well as spironolactone 100 mg daily with as needed refills  OSA - The patient is tolerating PAP therapy well without any problems.   The patient has been using and benefiting from PAP use and will continue to benefit from therapy.  -her device says that she has not been using it but she has been using it every night -I will get an OV with the DME to look at her device  Mitral regurgitation -Mild to moderate by 2D echocardiogram done 08/03/2021 -repeat echo 07/2022 with mild MR -Mild to moderate MR by echo 07/2023 -Repeat echo 07/2024  Dilated ascending aorta -38mm by echo 12/22 -Repeat echo 08/08/2022 showed ascending aorta measuring 40  mm -Repeat limited echo in 1 year  Followup with me in 1 year   Medication Adjustments/Labs and Tests Ordered: Current medicines are reviewed at length with the patient today.  Concerns regarding medicines are outlined above.  Medication changes, Labs and Tests ordered today are listed in the Patient Instructions below.  There are no Patient Instructions on file for this visit.   Signed, Armanda Magic, MD  11/28/2023 10:40 AM    [facility] [address] [place], [place], [place]  [address] Phone: [phone]; Fax: [phone]

## 2023-11-28 NOTE — Patient Instructions (Addendum)
 Medication Instructions:  Your physician recommends that you continue on your current medications as directed. Please refer to the Current Medication list given to you today.  *If you need a refill on your cardiac medications before your next appointment, please call your pharmacy*  Lab Work: None ordered If you have labs (blood work) drawn today and your tests are completely normal, you will receive your results only by: MyChart Message (if you have MyChart) OR A paper copy in the mail If you have any lab test that is abnormal or we need to change your treatment, we will call you to review the results.  Testing/Procedures: Your physician has requested that you have an echocardiogram- in December 2025. Echocardiography is a painless test that uses sound waves to create images of your heart. It provides your doctor with information about the size and shape of your heart and how well your heart's chambers and valves are working. This procedure takes approximately one hour. There are no restrictions for this procedure. Please do NOT wear cologne, perfume, aftershave, or lotions (deodorant is allowed). Please arrive 15 minutes prior to your appointment time.  Please note: We ask at that you not bring children with you during ultrasound (echo/ vascular) testing. Due to room size and safety concerns, children are not allowed in the ultrasound rooms during exams. Our front office staff cannot provide observation of children in our lobby area while testing is being conducted. An adult accompanying a patient to their appointment will only be allowed in the ultrasound room at the discretion of the ultrasound technician under special circumstances. We apologize for any inconvenience.  Follow-Up: At [facility], you and your health needs are our priority.  As part of our continuing mission to provide you with exceptional heart care, our providers are all part of one team.  This team includes your  primary Cardiologist (physician) and Advanced Practice Providers or APPs (Physician Assistants and Nurse Practitioners) who all work together to provide you with the care you need, when you need it.  Your next appointment:   1 year(s)  Provider:   Armanda Magic, MD     We recommend signing up for the patient portal called "MyChart".  Sign up information is provided on this After Visit Summary.  MyChart is used to connect with patients for Virtual Visits (Telemedicine).  Patients are able to view lab/test results, encounter notes, upcoming appointments, etc.  Non-urgent messages can be sent to your provider as well.   To learn more about what you can do with MyChart, go to [email or web address].   Other Instructions       1st Floor: - Lobby - Registration  - Pharmacy  - Lab - Cafe  2nd Floor: - PV Lab - Diagnostic Testing (echo, CT, nuclear med)  3rd Floor: - Vacant  4th Floor: - TCTS (cardiothoracic surgery) - AFib Clinic - Structural Heart Clinic - Vascular Surgery  - Vascular Ultrasound  5th Floor: - HeartCare Cardiology (general and EP) - Clinical Pharmacy for coumadin, hypertension, lipid, weight-loss medications, and med management appointments    Valet parking services will be available as well.

## 2023-12-02 ENCOUNTER — Other Ambulatory Visit (HOSPITAL_COMMUNITY)

## 2023-12-16 ENCOUNTER — Other Ambulatory Visit (INDEPENDENT_AMBULATORY_CARE_PROVIDER_SITE_OTHER)

## 2023-12-16 DIAGNOSIS — Z79899 Other long term (current) drug therapy: Secondary | ICD-10-CM | POA: Diagnosis not present

## 2023-12-16 DIAGNOSIS — F331 Major depressive disorder, recurrent, moderate: Secondary | ICD-10-CM | POA: Diagnosis not present

## 2023-12-16 NOTE — Addendum Note (Signed)
 Addended by: Emalea Mix, JA'BRON N on: 12/16/2023 01:37 PM   Modules accepted: Orders

## 2023-12-16 NOTE — Progress Notes (Signed)
 Patient tolerated labs well and will follow up when they result .

## 2023-12-17 LAB — VITAMIN B12: Vitamin B-12: 459 pg/mL (ref 232–1245)

## 2023-12-17 LAB — VITAMIN D 25 HYDROXY (VIT D DEFICIENCY, FRACTURES): Vit D, 25-Hydroxy: 36.6 ng/mL (ref 30.0–100.0)

## 2023-12-17 LAB — FOLATE: Folate: 14.7 ng/mL (ref >3.0)

## 2023-12-20 ENCOUNTER — Ambulatory Visit (INDEPENDENT_AMBULATORY_CARE_PROVIDER_SITE_OTHER): Payer: Self-pay | Admitting: Nurse Practitioner

## 2023-12-20 ENCOUNTER — Encounter: Payer: Self-pay | Admitting: Nurse Practitioner

## 2023-12-20 VITALS — BP 147/84 | HR 81 | Temp 98.1°F | Ht 60.0 in | Wt 255.0 lb

## 2023-12-20 DIAGNOSIS — Z6841 Body Mass Index (BMI) 40.0 and over, adult: Secondary | ICD-10-CM | POA: Diagnosis not present

## 2023-12-20 DIAGNOSIS — G8929 Other chronic pain: Secondary | ICD-10-CM

## 2023-12-20 DIAGNOSIS — M25562 Pain in left knee: Secondary | ICD-10-CM

## 2023-12-20 DIAGNOSIS — M25561 Pain in right knee: Secondary | ICD-10-CM

## 2023-12-20 MED ORDER — OXYCODONE-ACETAMINOPHEN 10-325 MG PO TABS: 1.0000 | ORAL_TABLET | Freq: Three times a day (TID) | ORAL | 0 refills | Status: AC | PRN

## 2023-12-20 NOTE — Patient Instructions (Signed)
 1. Bilateral chronic knee pain (Primary)  - Ambulatory referral to Physical Medicine Rehab - oxyCODONE -acetaminophen  (PERCOCET) 10-325 MG tablet; Take 1 tablet by mouth every 8 (eight) hours as needed for up to 5 days for pain.  Dispense: 15 tablet; Refill: 0  2. BMI 50.0-59.9, adult (HCC)  - Amb Ref to Medical Weight Management

## 2023-12-20 NOTE — Progress Notes (Signed)
 Subjective   Patient ID: Caroline Ryan, female    DOB: 18-Nov-1965, 58 y.o.   MRN: [id number]  Chief Complaint  Patient presents with   discuss home health referral     Discuss home health referral ,have form that needs to fill out , she is going have surgery on right knee replacement 01/09/24. She lives alone and stairs in her home and needs assistance     Referring provider: Jerrlyn Morel, NP  Caroline Ryan is a 58 y.o. female with Past Medical History: No date: Anemia No date: Asthma No date: Depression     Comment:  no meds No date: Depression No date: Eczema No date: Fibroid No date: Headache(784.0)     Comment:  otc prn med No date: History of blood transfusion No date: Hypertension No date: Knee pain, bilateral     Comment:  arthralgia knee pain bilateral No date: Lower extremity edema No date: Mitral regurgitation     Comment:  Mild to moderate by echo 07/2023 No date: Seasonal allergies No date: Sleep apnea No date: SVD (spontaneous vaginal delivery)     Comment:  x 3 05/2020: Vaginal polyp   HPI  Patient presents today for home health referral.  Patient is obese and has bilateral knee pain.  She is following with orthopedics and awaiting knee replacement surgery.  She does want referral to pain management as well until she can get her surgery.  We will also place a referral for weight management.  Patient does need help at home with activities of daily living.  Form was completed during office visit today. Denies f/c/s, n/v/d, hemoptysis, PND, leg swelling Denies chest pain or edema     No Known Allergies  Immunization History  Administered Date(s) Administered   Influenza Whole 06/30/2009   Influenza,inj,Quad PF,6+ Mos 06/27/2015, 07/04/2016, 06/09/2018, 06/10/2019   PFIZER(Purple Top)SARS-COV-2 Vaccination 12/25/2019, 01/15/2020   Pfizer(Comirnaty )Fall Seasonal Vaccine 12 years and older 09/20/2022   Pneumococcal Polysaccharide-23  04/14/2008, 06/27/2015   Td 10/18/2005   Tdap 07/04/2016    Tobacco History: Social History   Tobacco Use  Smoking Status Former   Current packs/day: 0.00   Average packs/day: 0.3 packs/day for 2.0 years (0.5 ttl pk-yrs)   Types: Cigarettes   Start date: 08/20/2012   Quit date: 08/20/2014   Years since quitting: 9.3   Passive exposure: Never  Smokeless Tobacco Never   Counseling given: Not Answered   Outpatient Encounter Medications as of 12/20/2023  Medication Sig   acetaminophen  (TYLENOL ) 325 MG tablet Take 2 tablets (650 mg total) by mouth daily as needed for moderate pain (pain score 4-6), headache or fever.   albuterol  (VENTOLIN  HFA) 108 (90 Base) MCG/ACT inhaler Inhale 1 puff every 6 hours as needed for shortness of breath   aspirin  (ASPIRIN  LOW DOSE) 81 MG chewable tablet Chew 1 tablet (81 mg total) by mouth daily.   buPROPion  (WELLBUTRIN  XL) 150 MG 24 hr tablet Take 1 tablet (150 mg total) by mouth daily.   carvedilol  (COREG ) 25 MG tablet Take 1 tablet (25 mg total) by mouth 2 (two) times daily.   cetirizine  (ZYRTEC ) 10 MG tablet Take 1 tablet (10 mg total) by mouth daily.   ciclopirox  (LOPROX ) 0.77 % cream Apply topically 2 (two) times daily.   cyclobenzaprine  (FLEXERIL ) 5 MG tablet Take 1 tablet (5 mg total) by mouth 3 (three) times daily as needed for muscle spasms.   diclofenac  (VOLTAREN ) 75 MG EC tablet Take 1 tablet (75  mg total) by mouth 2 (two) times daily.   docusate sodium  (COLACE) 100 MG capsule Take 1 capsule (100 mg total) by mouth 2 (two) times daily as needed.   fluticasone  (FLONASE ) 50 MCG/ACT nasal spray Place 1 spray into both nostrils daily. (Patient taking differently: Place 1 spray into both nostrils daily as needed for allergies.)   hydrOXYzine  (ATARAX ) 25 MG tablet Take 1 tablet (25 mg total) by mouth 3 (three) times daily as needed.   ipratropium-albuterol  (DUONEB) 0.5-2.5 (3) MG/3ML SOLN Take 3 mLs by nebulization every 6 (six) hours as needed.    linaclotide  (LINZESS ) 72 MCG capsule Take 1 capsule (72 mcg total) by mouth daily before breakfast.   meloxicam  (MOBIC ) 15 MG tablet Take 1 tablet (15 mg total) by mouth daily as needed for pain.   methocarbamol  (ROBAXIN -750) 750 MG tablet Take 1 tablet (750 mg total) by mouth 4 (four) times daily.   nitroGLYCERIN  (NITROSTAT ) 0.4 MG SL tablet Place 1 tablet (0.4 mg total) under the tongue every 5 (five) minutes as needed for chest pain (max 3 doses, if no relief call 911).   NONFORMULARY OR COMPOUNDED ITEM Diclofenac  3%, Gabapentin  5%, Lidocaine  5%, Menthol 1%.  Apply 1-2 pumps three to four times per day as needed   olmesartan  (BENICAR ) 40 MG tablet Take 1 tablet (40 mg total) by mouth daily.   oxyCODONE -acetaminophen  (PERCOCET) 10-325 MG tablet Take 1 tablet by mouth every 8 (eight) hours as needed for up to 5 days for pain.   Respiratory Therapy Supplies (NEBULIZER MASK ADULT) MISC Use this mask with your nebulizer.   spironolactone  (ALDACTONE ) 100 MG tablet Take 1 tablet (100 mg total) by mouth daily.   triamcinolone  cream (KENALOG ) 0.5 % Apply 1 Application topically 3 (three) times daily.   Vitamin D , Ergocalciferol , (DRISDOL ) 1.25 MG (50000 UNIT) CAPS capsule Take 1 capsule (50,000 Units total) by mouth every 7 (seven) days.   diclofenac  Sodium (VOLTAREN ) 1 % GEL Apply 4 g topically 4 (four) times daily. (Patient not taking: Reported on 12/20/2023)   ferrous gluconate  (FERGON) 324 MG tablet Take 1 tablet (324 mg total) by mouth 2 (two) times daily with a meal.   furosemide  (LASIX ) 20 MG tablet Take 1 tablet (20 mg total) by mouth daily.   hydrocortisone  2.5 % cream Apply topically 2 (two) times daily.   ibuprofen  (ADVIL ) 800 MG tablet Take 1 tablet (800 mg total) by mouth 3 (three) times daily. (Patient not taking: Reported on 12/20/2023)   Facility-Administered Encounter Medications as of 12/20/2023  Medication   Triamcinolone  Acetonide (ZILRETTA ) intra-articular injection 32 mg   Triamcinolone   Acetonide (ZILRETTA ) intra-articular injection 32 mg    Review of Systems  Review of Systems  Constitutional: Negative.   HENT: Negative.    Cardiovascular: Negative.   Gastrointestinal: Negative.   Allergic/Immunologic: Negative.   Neurological: Negative.   Psychiatric/Behavioral: Negative.       Objective:   BP (!) 147/84   Pulse 81   Temp 98.1 F (36.7 C) (Oral)   Ht 5' (1.524 m)   Wt 255 lb (115.7 kg)   LMP 04/03/2017 (Approximate)   SpO2 96%   BMI 49.80 kg/m   Wt Readings from Last 5 Encounters:  12/20/23 255 lb (115.7 kg)  11/28/23 248 lb (112.5 kg)  10/07/23 246 lb 12.8 oz (111.9 kg)  06/14/23 245 lb 3.2 oz (111.2 kg)  06/06/23 240 lb (108.9 kg)     Physical Exam Vitals and nursing note reviewed.  Constitutional:  General: She is not in acute distress.    Appearance: She is well-developed.  Cardiovascular:     Rate and Rhythm: Normal rate and regular rhythm.  Pulmonary:     Effort: Pulmonary effort is normal.     Breath sounds: Normal breath sounds.  Neurological:     Mental Status: She is alert and oriented to person, place, and time.       Assessment & Plan:   Bilateral chronic knee pain -     Ambulatory referral to Physical Medicine Rehab -     oxyCODONE -Acetaminophen ; Take 1 tablet by mouth every 8 (eight) hours as needed for up to 5 days for pain.  Dispense: 15 tablet; Refill: 0 -     Ambulatory referral to Home Health  BMI 50.0-59.9, adult (HCC) -     Amb Ref to Medical Weight Management -     Ambulatory referral to Home Health     Return in about 3 months (around 03/21/2024).   Jerrlyn Morel, NP 12/20/2023

## 2023-12-23 ENCOUNTER — Encounter (INDEPENDENT_AMBULATORY_CARE_PROVIDER_SITE_OTHER): Payer: Self-pay

## 2023-12-24 ENCOUNTER — Telehealth: Payer: Self-pay | Admitting: Nurse Practitioner

## 2023-12-24 NOTE — Telephone Encounter (Signed)
 Copied from CRM [id number]. Topic: General - Other >> Dec 24, 2023  4:24 PM Phil Braun wrote: Reason for CRM:   Camilo Cella, with [employer], [phone]  Ref Incontinence supplies, Bed pads, gloves and 2xl pullons.   Camilo Cella stated that she has been trying to fax this over for some time and hasn't heard from anyone.

## 2023-12-30 DIAGNOSIS — I10 Essential (primary) hypertension: Secondary | ICD-10-CM | POA: Diagnosis not present

## 2023-12-30 DIAGNOSIS — G4733 Obstructive sleep apnea (adult) (pediatric): Secondary | ICD-10-CM | POA: Diagnosis not present

## 2023-12-31 ENCOUNTER — Other Ambulatory Visit: Payer: Self-pay | Admitting: Nurse Practitioner

## 2023-12-31 ENCOUNTER — Other Ambulatory Visit: Payer: Self-pay

## 2023-12-31 DIAGNOSIS — K5909 Other constipation: Secondary | ICD-10-CM

## 2023-12-31 DIAGNOSIS — I10 Essential (primary) hypertension: Secondary | ICD-10-CM | POA: Diagnosis not present

## 2023-12-31 DIAGNOSIS — J4520 Mild intermittent asthma, uncomplicated: Secondary | ICD-10-CM

## 2023-12-31 DIAGNOSIS — G4733 Obstructive sleep apnea (adult) (pediatric): Secondary | ICD-10-CM | POA: Diagnosis not present

## 2023-12-31 MED ORDER — CETIRIZINE HCL 10 MG PO TABS
10.0000 mg | ORAL_TABLET | Freq: Every day | ORAL | 6 refills | Status: AC
Start: 2023-12-31 — End: ?
  Filled 2023-12-31: qty 30, 30d supply, fill #0

## 2023-12-31 MED ORDER — LINACLOTIDE 72 MCG PO CAPS
72.0000 ug | ORAL_CAPSULE | Freq: Every day | ORAL | 0 refills | Status: AC
  Filled 2023-12-31: qty 30, 30d supply, fill #0

## 2024-01-03 ENCOUNTER — Telehealth: Payer: Self-pay

## 2024-01-03 ENCOUNTER — Other Ambulatory Visit: Payer: Self-pay

## 2024-01-03 NOTE — Telephone Encounter (Signed)
 Copied from CRM [id number]. Topic: Clinical - Order For Equipment >> Jan 03, 2024  9:57 AM Baldomero Bone wrote: Reason for CRM: Patient needs a prescription sent to [facility] for a new cane because her cane broke. Patient cannot get around without it. Callback number [phone].

## 2024-01-07 ENCOUNTER — Telehealth: Payer: Self-pay | Admitting: Nurse Practitioner

## 2024-01-07 DIAGNOSIS — M170 Bilateral primary osteoarthritis of knee: Secondary | ICD-10-CM | POA: Diagnosis not present

## 2024-01-07 DIAGNOSIS — I10 Essential (primary) hypertension: Secondary | ICD-10-CM | POA: Diagnosis not present

## 2024-01-07 DIAGNOSIS — E538 Deficiency of other specified B group vitamins: Secondary | ICD-10-CM | POA: Diagnosis not present

## 2024-01-07 DIAGNOSIS — Z01812 Encounter for preprocedural laboratory examination: Secondary | ICD-10-CM | POA: Diagnosis not present

## 2024-01-07 DIAGNOSIS — E559 Vitamin D deficiency, unspecified: Secondary | ICD-10-CM | POA: Diagnosis not present

## 2024-01-07 NOTE — Telephone Encounter (Signed)
 The patient called back in checking on the status of her cane to see if it has been called into [facility]. I teams [name] and spoke with [place]. The patient will call [employer] to see if they got the order. Please assist patient further

## 2024-01-08 LAB — VITAMIN D 25 HYDROXY (VIT D DEFICIENCY, FRACTURES): Vit D, 25-Hydroxy: 41.9 ng/mL (ref 30.0–100.0)

## 2024-01-08 LAB — VITAMIN B12: Vitamin B-12: 448 pg/mL (ref 232–1245)

## 2024-01-08 LAB — FOLATE: Folate: 13.2 ng/mL (ref >3.0)

## 2024-01-08 NOTE — Telephone Encounter (Signed)
 Pt called back in today because [facility] states that they dont have the order for the new cane. Please check the fax to see if it went through or refax order.

## 2024-01-09 ENCOUNTER — Other Ambulatory Visit: Payer: Self-pay

## 2024-01-09 DIAGNOSIS — G8929 Other chronic pain: Secondary | ICD-10-CM

## 2024-01-09 DIAGNOSIS — I10 Essential (primary) hypertension: Secondary | ICD-10-CM | POA: Diagnosis not present

## 2024-01-09 DIAGNOSIS — M1711 Unilateral primary osteoarthritis, right knee: Secondary | ICD-10-CM | POA: Diagnosis not present

## 2024-01-09 DIAGNOSIS — M25561 Pain in right knee: Secondary | ICD-10-CM | POA: Diagnosis not present

## 2024-01-09 DIAGNOSIS — M25562 Pain in left knee: Secondary | ICD-10-CM

## 2024-01-09 DIAGNOSIS — R0683 Snoring: Secondary | ICD-10-CM | POA: Diagnosis not present

## 2024-01-09 DIAGNOSIS — G4733 Obstructive sleep apnea (adult) (pediatric): Secondary | ICD-10-CM | POA: Diagnosis not present

## 2024-01-09 MED ORDER — METHYLPREDNISOLONE 4 MG PO TBPK
ORAL_TABLET | ORAL | 0 refills | Status: DC
  Filled 2024-01-09: qty 21, 6d supply, fill #0

## 2024-01-09 NOTE — Telephone Encounter (Signed)
 Done. Please co sign. Thank [facility]

## 2024-01-10 ENCOUNTER — Other Ambulatory Visit: Payer: Self-pay

## 2024-01-14 NOTE — Progress Notes (Deleted)
 BH MD Outpatient Progress Note  01/14/2024 11:36 AM Caroline Ryan [facility]  MRN: [id number]  Assessment:  Caroline Ryan presents for follow-up evaluation in-person.  ***  Identifying Information: Caroline Ryan is a 58 y.o. y.o. female with a past psychiatric history of MDD and GAD who is an established patient with [facility] for medication management. She is on Wellbutrin .  Plan:  # Major depressive disorder, recurrent, mild vs. Adjustment disorder with mixed depressed and anxious mood # Insomnia- OSA on CPAP Past medication trials: escitalopram  - unfavorable weight gain Vitamin B 12, folate, and vitamin D  level WNL from 12/2023 Interventions: -- continue bupropion  ER 150 mg daily    # Trauma and stressor-related disorder Interventions: -- seeing therapy   Patient was reminded of contact information for behavioral health clinic and was instructed to call [phone] for emergencies.   Subjective:  Interval History:  Today,   Knee pain Exercise   Visit Diagnosis:  No diagnosis found.   Past Psychiatric History: Previous Psych Diagnoses: Recently diagnosed with MDD by therapist at [facility] Prior inpatient treatment: Denies Psychotherapy hx: Getting therapy with [name] at [facility] Previous suicidal attempts: Denies Previous medication trials: None History of detox: N/A History of rehab: N/A  Substance Abuse History: Formerly smoked, quit 2016. Former cannabis use, quit in 2019.   Social history: Lives with son sometimes and he helps with transportation. Her daughter lives around here too. Patient lives in an apartment.  Past Medical History:  Past Medical History:  Diagnosis Date   Anemia    Asthma    Depression    no meds   Depression    Eczema    Fibroid    Headache(784.0)    otc prn med   History of blood transfusion    Hypertension    Knee pain, bilateral    arthralgia knee pain bilateral   Lower extremity  edema    Mitral regurgitation    Mild to moderate by echo 07/2023   Seasonal allergies    Sleep apnea    SVD (spontaneous vaginal delivery)    x 3   Vaginal polyp 05/2020    Past Surgical History:  Procedure Laterality Date   ABDOMINAL HYSTERECTOMY     BILATERAL SALPINGECTOMY Bilateral 10/10/2017   Procedure: BILATERAL SALPINGECTOMY;  Surgeon: Othelia Blinks, MD;  Location: [facility];  Service: Gynecology;  Laterality: Bilateral;   BREAST BIOPSY     us  guided core 2009   BREAST EXCISIONAL BIOPSY     right 1982 left 1981   BREAST SURGERY     bilateral cysts/benign   COLONOSCOPY  10/17/2021   VAGINAL HYSTERECTOMY N/A 10/10/2017   Procedure: HYSTERECTOMY VAGINAL;  Surgeon: Othelia Blinks, MD;  Location: [facility];  Service: Gynecology;  Laterality: N/A;   Family Psychiatric History: Psych: Son- Schizophrenia, he comes to [facility]   Family History:  Family History  Problem Relation Age of Onset   Breast cancer Mother 75   Diabetes Father    Hypertension Father    Diabetes Brother    Diabetes Brother    Other Neg Hx    Colon polyps Neg Hx    Colon cancer Neg Hx    Esophageal cancer Neg Hx    Stomach cancer Neg Hx    Rectal cancer Neg Hx     Social History: Social History   Socioeconomic History   Marital status: Single    Spouse name: Not on  file   Number of children: 3   Years of education: Not on file   Highest education level: Not on file  Occupational History   Not on file  Tobacco Use   Smoking status: Former    Current packs/day: 0.00    Average packs/day: 0.3 packs/day for 2.0 years (0.5 ttl pk-yrs)    Types: Cigarettes    Start date: 08/20/2012    Quit date: 08/20/2014    Years since quitting: 9.4    Passive exposure: Never   Smokeless tobacco: Never  Vaping Use   Vaping status: Never Used  Substance and Sexual Activity   Alcohol use: Yes    Alcohol/week: 2.0 standard drinks of alcohol    Types: 2 Glasses of wine per week    Comment: once a week    Drug use: No   Sexual activity: Yes    Birth control/protection: Condom  Other Topics Concern   Not on file  Social History Narrative   Not on file   Social Drivers of Health   Financial Resource Strain: High Risk (08/21/2022)   Overall Financial Resource Strain (CARDIA)    Difficulty of Paying Living Expenses: Very hard  Food Insecurity: Food Insecurity Present (08/21/2022)   Hunger Vital Sign    Worried About Running Out of Food in the Last Year: Often true    Ran Out of Food in the Last Year: Often true  Transportation Needs: No Transportation Needs (08/21/2022)   PRAPARE - Administrator, Civil Service (Medical): No    Lack of Transportation (Non-Medical): No  Physical Activity: Inactive (08/21/2022)   Exercise Vital Sign    Days of Exercise per Week: 0 days    Minutes of Exercise per Session: 0 min  Stress: Stress Concern Present (07/27/2022)   Caroline Ryan    Feeling of Stress : Very much  Social Connections: Socially Isolated (08/21/2022)   Social Connection and Isolation Panel [NHANES]    Frequency of Communication with Friends and Family: More than three times a week    Frequency of Social Gatherings with Friends and Family: Never    Attends Religious Services: Never    Database administrator or Organizations: No    Attends Engineer, structural: Never    Marital Status: Never married    Allergies:  No Known Allergies  Current Medications: Current Outpatient Medications  Medication Sig Dispense Refill   acetaminophen  (TYLENOL ) 325 MG tablet Take 2 tablets (650 mg total) by mouth daily as needed for moderate pain (pain score 4-6), headache or fever. 100 tablet 0   albuterol  (VENTOLIN  HFA) 108 (90 Base) MCG/ACT inhaler Inhale 1 puff every 6 hours as needed for shortness of breath 18 g 5   aspirin  (ASPIRIN  LOW DOSE) 81 MG chewable tablet Chew 1 tablet (81 mg total) by mouth daily. 30 tablet 2    buPROPion  (WELLBUTRIN  XL) 150 MG 24 hr tablet Take 1 tablet (150 mg total) by mouth daily. 30 tablet 2   carvedilol  (COREG ) 25 MG tablet Take 1 tablet (25 mg total) by mouth 2 (two) times daily. 180 tablet 3   cetirizine  (ZYRTEC ) 10 MG tablet Take 1 tablet (10 mg total) by mouth daily. 30 tablet 6   ciclopirox  (LOPROX ) 0.77 % cream Apply topically 2 (two) times daily. 15 g 0   cyclobenzaprine  (FLEXERIL ) 5 MG tablet Take 1 tablet (5 mg total) by mouth 3 (three) times daily as needed  for muscle spasms. 30 tablet 0   diclofenac  (VOLTAREN ) 75 MG EC tablet Take 1 tablet (75 mg total) by mouth 2 (two) times daily. 30 tablet 0   diclofenac  Sodium (VOLTAREN ) 1 % GEL Apply 4 g topically 4 (four) times daily. (Patient not taking: Reported on 12/20/2023) 100 g 3   docusate sodium  (COLACE) 100 MG capsule Take 1 capsule (100 mg total) by mouth 2 (two) times daily as needed. 30 capsule 2   ferrous gluconate  (FERGON) 324 MG tablet Take 1 tablet (324 mg total) by mouth 2 (two) times daily with a meal. 180 tablet 3   fluticasone  (FLONASE ) 50 MCG/ACT nasal spray Place 1 spray into both nostrils daily. (Patient taking differently: Place 1 spray into both nostrils daily as needed for allergies.) 16 g 2   furosemide  (LASIX ) 20 MG tablet Take 1 tablet (20 mg total) by mouth daily. 30 tablet 6   hydrocortisone  2.5 % cream Apply topically 2 (two) times daily. 30 g 1   hydrOXYzine  (ATARAX ) 25 MG tablet Take 1 tablet (25 mg total) by mouth 3 (three) times daily as needed. 30 tablet 11   ibuprofen  (ADVIL ) 800 MG tablet Take 1 tablet (800 mg total) by mouth 3 (three) times daily. (Patient not taking: Reported on 12/20/2023) 30 tablet 6   ipratropium-albuterol  (DUONEB) 0.5-2.5 (3) MG/3ML SOLN Take 3 mLs by nebulization every 6 (six) hours as needed. 360 mL 0   linaclotide  (LINZESS ) 72 MCG capsule Take 1 capsule (72 mcg total) by mouth daily before breakfast. 30 capsule 0   meloxicam  (MOBIC ) 15 MG tablet Take 1 tablet (15 mg total)  by mouth daily as needed for pain. 90 tablet 3   methocarbamol  (ROBAXIN -750) 750 MG tablet Take 1 tablet (750 mg total) by mouth 4 (four) times daily. 120 tablet 3   methylPREDNISolone  (MEDROL  DOSEPAK) 4 MG TBPK tablet Use as directed for 6 days on 01/10/2024 end on 01/15/2024 21 each 0   nitroGLYCERIN  (NITROSTAT ) 0.4 MG SL tablet Place 1 tablet (0.4 mg total) under the tongue every 5 (five) minutes as needed for chest pain (max 3 doses, if no relief call 911). 25 tablet 5   NONFORMULARY OR COMPOUNDED ITEM Diclofenac  3%, Gabapentin  5%, Lidocaine  5%, Menthol 1%.  Apply 1-2 pumps three to four times per day as needed 1 each 0   olmesartan  (BENICAR ) 40 MG tablet Take 1 tablet (40 mg total) by mouth daily. 90 tablet 3   Respiratory Therapy Supplies (NEBULIZER MASK ADULT) MISC Use this mask with your nebulizer. 1 each 0   spironolactone  (ALDACTONE ) 100 MG tablet Take 1 tablet (100 mg total) by mouth daily. 90 tablet 3   triamcinolone  cream (KENALOG ) 0.5 % Apply 1 Application topically 3 (three) times daily. 30 g 0   Vitamin D , Ergocalciferol , (DRISDOL ) 1.25 MG (50000 UNIT) CAPS capsule Take 1 capsule (50,000 Units total) by mouth every 7 (seven) days. 12 capsule 0   Current Facility-Administered Medications  Medication Dose Route Frequency Provider Last Rate Last Admin   Triamcinolone  Acetonide (ZILRETTA ) intra-articular injection 32 mg  32 mg Intra-articular Once Raulkar, Krutika P, MD       Triamcinolone  Acetonide (ZILRETTA ) intra-articular injection 32 mg  32 mg Intra-articular Once Raulkar, Keven Pel, MD       Physical Exam Vitals reviewed.  Constitutional:      Appearance: Normal appearance.  HENT:     Head: Normocephalic and atraumatic.  Cardiovascular:     Rate and Rhythm: Normal rate.  Pulmonary:  Effort: Pulmonary effort is normal.  Neurological:     Mental Status: She is alert and oriented to person, place, and time.    ROS: Review of Systems  Constitutional:  Negative for  chills and fever.  Gastrointestinal:  Negative for nausea and vomiting.  Musculoskeletal:  Positive for joint pain.  Neurological:  Negative for tremors and headaches.    Objective:  Psychiatric Specialty Exam: Last menstrual period 04/03/2017.There is no height or weight on file to calculate BMI.   General Appearance: appears at stated age, casually dressed and groomed ***  Behavior: pleasant and cooperative ***  Psychomotor Activity: no psychomotor agitation or retardation noted ***  Eye Contact: fair *** Speech: normal amount, volume and fluency ***   Mood: euthymic *** Affect: congruent, pleasant and interactive ***  Thought Process: linear, goal directed, no circumstantial or tangential thought process noted, no racing thoughts or flight of ideas *** Descriptions of Associations: intact ***  Thought Content Hallucinations: denies AH, VH , does not appear responding to stimuli *** Delusions: no paranoia, delusions of control, grandeur, ideas of reference, thought broadcasting, and magical thinking *** Suicidal Thoughts: denies SI, intention, plan *** Homicidal Thoughts: denies HI, intention, plan ***  Alertness/Orientation: alert and fully oriented ***  Insight: fair*** Judgment: fair***  Memory: intact ***  Executive Functions  Concentration: intact *** Attention Span: fair *** Recall: intact *** Fund of Knowledge: fair ***    Metabolic Disorder Labs: Lab Results  Component Value Date   HGBA1C 4.9 02/27/2023   MPG 82 07/04/2016   MPG 100 06/27/2015   No results found for: "PROLACTIN" Lab Results  Component Value Date   CHOL 186 02/27/2023   TRIG 79 02/27/2023   HDL 55 02/27/2023   CHOLHDL 3.4 02/27/2023   VLDL 9 06/27/2015   LDLCALC 116 (H) 02/27/2023   LDLCALC 119 (H) 09/08/2021   Lab Results  Component Value Date   TSH 2.710 02/27/2023   TSH 1.530 12/24/2018    Therapeutic Level Labs: No results found for: "CBMZ" No results found for:  "LITHIUM" No results found for: "VALPROATE"  Screenings:  GAD-7    Flowsheet Row Office Visit from 09/21/2022 in [facility] Counselor from 08/21/2022 in [facility] Office Visit from [facility]/09/2020 [facility] Office Visit from 07/19/2017 in [facility]  Total GAD-7 Score 17 21 9 14       PHQ2-9    Flowsheet Row Office Visit from 12/20/2023 in [facility] Office Visit from 11/13/2023 in [facility] Office Visit from 04/26/2023 in [facility] Clinical Support from 03/27/2023 in [facility] [place] Office Visit from 09/21/2022 in [place]  PHQ-2 Total Score 1 3 3 4 4   PHQ-9 Total Score -- 11 -- 14 16      Flowsheet Row CT Bone Marrow Biopsy from 06/06/2023 in [facility] from 10/23/2022 in [facility] Department at [facility] [place] Counselor from 08/21/2022 in [facility]  C-SSRS RISK CATEGORY No Risk No Risk Low Risk        Caroline Nares, MD 01/14/2024, 11:36 AM

## 2024-01-15 ENCOUNTER — Encounter (HOSPITAL_COMMUNITY): Admitting: Psychiatry

## 2024-01-21 ENCOUNTER — Other Ambulatory Visit: Payer: Self-pay

## 2024-01-21 ENCOUNTER — Other Ambulatory Visit: Payer: Self-pay | Admitting: Physical Medicine and Rehabilitation

## 2024-01-21 ENCOUNTER — Other Ambulatory Visit: Payer: Self-pay | Admitting: Nurse Practitioner

## 2024-01-21 DIAGNOSIS — E559 Vitamin D deficiency, unspecified: Secondary | ICD-10-CM

## 2024-01-21 MED ORDER — VITAMIN D (ERGOCALCIFEROL) 1.25 MG (50000 UNIT) PO CAPS
50000.0000 [IU] | ORAL_CAPSULE | ORAL | 0 refills | Status: DC
Start: 2024-01-21 — End: 2024-05-01
  Filled 2024-01-21: qty 12, 84d supply, fill #0

## 2024-01-22 ENCOUNTER — Other Ambulatory Visit: Payer: Self-pay

## 2024-01-22 MED ORDER — CYCLOBENZAPRINE HCL 5 MG PO TABS
5.0000 mg | ORAL_TABLET | Freq: Three times a day (TID) | ORAL | 0 refills | Status: DC | PRN
  Filled 2024-01-22: qty 30, 10d supply, fill #0

## 2024-01-22 MED ORDER — ACETAMINOPHEN 325 MG PO TABS
650.0000 mg | ORAL_TABLET | Freq: Every day | ORAL | 0 refills | Status: AC | PRN
  Filled 2024-01-22: qty 100, 50d supply, fill #0

## 2024-01-28 ENCOUNTER — Other Ambulatory Visit: Payer: Self-pay

## 2024-01-28 ENCOUNTER — Other Ambulatory Visit: Payer: Self-pay | Admitting: Nurse Practitioner

## 2024-01-28 MED ORDER — SPIRONOLACTONE 100 MG PO TABS
100.0000 mg | ORAL_TABLET | Freq: Every day | ORAL | 3 refills | Status: AC
  Filled 2024-01-28: qty 90, 90d supply, fill #0
  Filled 2024-06-19: qty 90, 90d supply, fill #1

## 2024-01-28 MED ORDER — CARVEDILOL 25 MG PO TABS
25.0000 mg | ORAL_TABLET | Freq: Two times a day (BID) | ORAL | 3 refills | Status: AC
  Filled 2024-01-28: qty 180, 90d supply, fill #0
  Filled 2024-06-19: qty 180, 90d supply, fill #1

## 2024-01-29 ENCOUNTER — Other Ambulatory Visit: Payer: Self-pay

## 2024-01-29 ENCOUNTER — Telehealth: Payer: Self-pay

## 2024-01-29 NOTE — Telephone Encounter (Signed)
 Copied from CRM [id number]. Topic: Clinical - Medical Advice >> Jan 29, 2024  9:39 AM Caroline Ryan wrote: Pt is calling because she received a letter stating she's due for a pap smear however she would like to know where she can go. Pt also expressed her concerns on her last pap smear experience with the reaction of a provider telling her she didn't need one because she had a Hysterectomy. Please give her a call.

## 2024-01-30 ENCOUNTER — Other Ambulatory Visit (HOSPITAL_BASED_OUTPATIENT_CLINIC_OR_DEPARTMENT_OTHER): Payer: Self-pay

## 2024-01-30 MED ORDER — COMIRNATY 30 MCG/0.3ML IM SUSY
PREFILLED_SYRINGE | INTRAMUSCULAR | 0 refills | Status: DC
  Filled 2024-01-30: qty 0.3, 1d supply, fill #0

## 2024-02-04 ENCOUNTER — Inpatient Hospital Stay: Payer: 59 | Attending: Hematology

## 2024-02-04 DIAGNOSIS — M25562 Pain in left knee: Secondary | ICD-10-CM | POA: Diagnosis not present

## 2024-02-04 DIAGNOSIS — I10 Essential (primary) hypertension: Secondary | ICD-10-CM | POA: Diagnosis not present

## 2024-02-04 DIAGNOSIS — M170 Bilateral primary osteoarthritis of knee: Secondary | ICD-10-CM | POA: Diagnosis not present

## 2024-02-04 DIAGNOSIS — M25561 Pain in right knee: Secondary | ICD-10-CM | POA: Diagnosis not present

## 2024-02-10 ENCOUNTER — Other Ambulatory Visit: Payer: Self-pay

## 2024-02-18 DIAGNOSIS — G4733 Obstructive sleep apnea (adult) (pediatric): Secondary | ICD-10-CM | POA: Diagnosis not present

## 2024-02-18 DIAGNOSIS — I10 Essential (primary) hypertension: Secondary | ICD-10-CM | POA: Diagnosis not present

## 2024-02-24 ENCOUNTER — Telehealth: Payer: Self-pay | Admitting: Physical Medicine and Rehabilitation

## 2024-02-24 NOTE — Telephone Encounter (Signed)
 Patient called in requesting to be scheduled for gel knee injections -- pt now has medicare

## 2024-02-24 NOTE — Progress Notes (Unsigned)
 BH MD Outpatient Progress Note  02/25/2024 10:25 AM Caroline Ryan [facility]  MRN: [id number]  Assessment:  Caroline Ryan presents for follow-up evaluation in-person. Patient is doing well regarding her mood and anxiety. She is involving herself in more hobbies which she finds benefit with her mood. Patient has a good support system and much of her stress stems from her medical condition. She does note hopefulness with her upcoming pain medication so that she will be more active. I will refill her Wellbutrin  and reassess in three months.   Identifying Information: Caroline Ryan is a 58 y.o. y.o. female with a past psychiatric history of MDD and GAD who is an established patient with [facility] for medication management.   Plan:  # Major depressive disorder, recurrent, mild vs. Adjustment disorder with mixed depressed and anxious mood # Insomnia- OSA on CPAP Past medication trials: escitalopram  - unfavorable weight gain Interventions: -- Continue Wellbutrin  ER 150 mg daily  -- Vitamin B 12, folate, and vitamin D  level WNL on 12/2023   # Trauma and stressor-related disorder Interventions: -- seeing therapy  Patient was reminded of contact information for behavioral health clinic and was instructed to call [phone] or [phone] for emergencies.   Subjective:  Interval History:  Patient seen alone.  Patient reports feeling good today. Since the previous visit, she reports feeling about the same. Regarding medications, patient notes benefit with her mood swings. She does note feeling sad at one point in the day which she notes this is improvement compared to persistently feeling sad in the entire day. She reports getting into Bible study more often and gardening with her tomatoes, peppers, and hot peppers. She feels hopeful for upcoming gel injections for the pain. Patient reports the following adverse effects: denies. Patient reports good sleep, reporting 7 hours a  night. Patient reports good appetite, eating three meals a day and incorporating more fruits and vegetables. Patient denies current SI, HI, and AVH.   Substance use: denies   Visit Diagnosis:    ICD-10-CM   1. MDD (major depressive disorder), recurrent episode, mild (HCC)  F33.0 buPROPion  (WELLBUTRIN  XL) 150 MG 24 hr tablet    2. Other insomnia  G47.09        Past Psychiatric History: Previous Psych Diagnoses: Recently diagnosed with MDD by therapist at [facility] Prior inpatient treatment: Denies Psychotherapy hx: Getting therapy with Caroline Ryan at [facility] Previous suicidal attempts: Denies Previous medication trials: None History of detox: N/A History of rehab: N/A  Substance Abuse History: Formerly smoked, quit 2016. Former cannabis use, quit in 2019.   Social history: Lives with son sometimes and he helps with transportation. Her daughter lives around here too. Patient lives in an apartment. Job: unemployed Married/Children: single, two sons and one daughter Support: Son   Past Medical History:  Past Medical History:  Diagnosis Date   Anemia    Asthma    Depression    no meds   Depression    Eczema    Fibroid    Headache(784.0)    otc prn med   History of blood transfusion    Hypertension    Knee pain, bilateral    arthralgia knee pain bilateral   Lower extremity edema    Mitral regurgitation    Mild to moderate by echo 07/2023   Seasonal allergies    Sleep apnea    SVD (spontaneous vaginal delivery)    x 3   Vaginal polyp 05/2020  Past Surgical History:  Procedure Laterality Date   ABDOMINAL HYSTERECTOMY     BILATERAL SALPINGECTOMY Bilateral 10/10/2017   Procedure: BILATERAL SALPINGECTOMY;  Surgeon: Caroline Ozell CROME, MD;  Location: [facility];  Service: Gynecology;  Laterality: Bilateral;   BREAST BIOPSY     us  guided core 2009   BREAST EXCISIONAL BIOPSY     right 1982 left 1981   BREAST SURGERY     bilateral cysts/benign   COLONOSCOPY  10/17/2021    VAGINAL HYSTERECTOMY N/A 10/10/2017   Procedure: HYSTERECTOMY VAGINAL;  Surgeon: Caroline Ozell CROME, MD;  Location: [facility];  Service: Gynecology;  Laterality: N/A;   Family Psychiatric History: Psych: Son- Schizophrenia, he comes to [facility] too   Family History:  Family History  Problem Relation Age of Onset   Breast cancer Mother 29   Diabetes Father    Hypertension Father    Diabetes Brother    Diabetes Brother    Other Neg Hx    Colon polyps Neg Hx    Colon cancer Neg Hx    Esophageal cancer Neg Hx    Stomach cancer Neg Hx    Rectal cancer Neg Hx     Social History: Social History   Socioeconomic History   Marital status: Single    Spouse name: Not on file   Number of children: 3   Years of education: Not on file   Highest education level: Not on file  Occupational History   Not on file  Tobacco Use   Smoking status: Former    Current packs/day: 0.00    Average packs/day: 0.3 packs/day for 2.0 years (0.5 ttl pk-yrs)    Types: Cigarettes    Start date: 08/20/2012    Quit date: 08/20/2014    Years since quitting: 9.5    Passive exposure: Never   Smokeless tobacco: Never  Vaping Use   Vaping status: Never Used  Substance and Sexual Activity   Alcohol use: Yes    Alcohol/week: 2.0 standard drinks of alcohol    Types: 2 Glasses of wine per week    Comment: once a week   Drug use: No   Sexual activity: Yes    Birth control/protection: Condom  Other Topics Concern   Not on file  Social History Narrative   Not on file   Social Drivers of Health   Financial Resource Strain: High Risk (08/21/2022)   Overall Financial Resource Strain (CARDIA)    Difficulty of Paying Living Expenses: Very hard  Food Insecurity: Food Insecurity Present (08/21/2022)   Hunger Vital Sign    Worried About Running Out of Food in the Last Year: Often true    Ran Out of Food in the Last Year: Often true  Transportation Needs: No Transportation Needs (08/21/2022)   PRAPARE - Doctor, general practice (Medical): No    Lack of Transportation (Non-Medical): No  Physical Activity: Inactive (08/21/2022)   Exercise Vital Sign    Days of Exercise per Week: 0 days    Minutes of Exercise per Session: 0 min  Stress: Stress Concern Present (07/27/2022)   [employer] of Occupational Health - Occupational Stress Questionnaire    Feeling of Stress : Very much  Social Connections: Socially Isolated (08/21/2022)   Social Connection and Isolation Panel    Frequency of Communication with Friends and Family: More than three times a week    Frequency of Social Gatherings with Friends and Family: Never    Attends Religious Services:  Never    Active Member of Clubs or Organizations: No    Attends Banker Meetings: Never    Marital Status: Never married    Allergies:  No Known Allergies  Current Medications: Current Outpatient Medications  Medication Sig Dispense Refill   acetaminophen  (TYLENOL ) 325 MG tablet Take 2 tablets (650 mg total) by mouth daily as needed for moderate pain (pain score 4-6), headache or fever. 100 tablet 0   albuterol  (VENTOLIN  HFA) 108 (90 Base) MCG/ACT inhaler Inhale 1 puff every 6 hours as needed for shortness of breath 18 g 5   aspirin  (ASPIRIN  LOW DOSE) 81 MG chewable tablet Chew 1 tablet (81 mg total) by mouth daily. 30 tablet 2   buPROPion  (WELLBUTRIN  XL) 150 MG 24 hr tablet Take 1 tablet (150 mg total) by mouth daily. 30 tablet 2   carvedilol  (COREG ) 25 MG tablet Take 1 tablet (25 mg total) by mouth 2 (two) times daily. 180 tablet 3   cetirizine  (ZYRTEC ) 10 MG tablet Take 1 tablet (10 mg total) by mouth daily. 30 tablet 6   ciclopirox  (LOPROX ) 0.77 % cream Apply topically 2 (two) times daily. 15 g 0   COVID-19 mRNA vaccine, Pfizer, (COMIRNATY ) syringe Inject into the muscle. 0.3 mL 0   cyclobenzaprine  (FLEXERIL ) 5 MG tablet Take 1 tablet (5 mg total) by mouth 3 (three) times daily as needed for muscle spasms. 30 tablet 0   diclofenac   (VOLTAREN ) 75 MG EC tablet Take 1 tablet (75 mg total) by mouth 2 (two) times daily. 30 tablet 0   diclofenac  Sodium (VOLTAREN ) 1 % GEL Apply 4 g topically 4 (four) times daily. (Patient not taking: Reported on 12/20/2023) 100 g 3   docusate sodium  (COLACE) 100 MG capsule Take 1 capsule (100 mg total) by mouth 2 (two) times daily as needed. 30 capsule 2   ferrous gluconate  (FERGON) 324 MG tablet Take 1 tablet (324 mg total) by mouth 2 (two) times daily with a meal. 180 tablet 3   fluticasone  (FLONASE ) 50 MCG/ACT nasal spray Place 1 spray into both nostrils daily. (Patient taking differently: Place 1 spray into both nostrils daily as needed for allergies.) 16 g 2   furosemide  (LASIX ) 20 MG tablet Take 1 tablet (20 mg total) by mouth daily. 30 tablet 6   hydrocortisone  2.5 % cream Apply topically 2 (two) times daily. 30 g 1   hydrOXYzine  (ATARAX ) 25 MG tablet Take 1 tablet (25 mg total) by mouth 3 (three) times daily as needed. 30 tablet 11   ibuprofen  (ADVIL ) 800 MG tablet Take 1 tablet (800 mg total) by mouth 3 (three) times daily. (Patient not taking: Reported on 12/20/2023) 30 tablet 6   ipratropium-albuterol  (DUONEB) 0.5-2.5 (3) MG/3ML SOLN Take 3 mLs by nebulization every 6 (six) hours as needed. 360 mL 0   linaclotide  (LINZESS ) 72 MCG capsule Take 1 capsule (72 mcg total) by mouth daily before breakfast. 30 capsule 0   meloxicam  (MOBIC ) 15 MG tablet Take 1 tablet (15 mg total) by mouth daily as needed for pain. 90 tablet 3   methocarbamol  (ROBAXIN -750) 750 MG tablet Take 1 tablet (750 mg total) by mouth 4 (four) times daily. 120 tablet 3   methylPREDNISolone  (MEDROL  DOSEPAK) 4 MG TBPK tablet Use as directed for 6 days on 01/10/2024 end on 01/15/2024 21 each 0   nitroGLYCERIN  (NITROSTAT ) 0.4 MG SL tablet Place 1 tablet (0.4 mg total) under the tongue every 5 (five) minutes as needed for chest pain (max  3 doses, if no relief call 911). 25 tablet 5   NONFORMULARY OR COMPOUNDED ITEM Diclofenac  3%,  Gabapentin  5%, Lidocaine  5%, Menthol 1%.  Apply 1-2 pumps three to four times per day as needed 1 each 0   olmesartan  (BENICAR ) 40 MG tablet Take 1 tablet (40 mg total) by mouth daily. 90 tablet 3   Respiratory Therapy Supplies (NEBULIZER MASK ADULT) MISC Use this mask with your nebulizer. 1 each 0   spironolactone  (ALDACTONE ) 100 MG tablet Take 1 tablet (100 mg total) by mouth daily. 90 tablet 3   triamcinolone  cream (KENALOG ) 0.5 % Apply 1 Application topically 3 (three) times daily. 30 g 0   Vitamin D , Ergocalciferol , (DRISDOL ) 1.25 MG (50000 UNIT) CAPS capsule Take 1 capsule (50,000 Units total) by mouth every 7 (seven) days. 12 capsule 0   Current Facility-Administered Medications  Medication Dose Route Frequency Provider Last Rate Last Admin   Triamcinolone  Acetonide (ZILRETTA ) intra-articular injection 32 mg  32 mg Intra-articular Once Raulkar, Sven SQUIBB, MD       Triamcinolone  Acetonide (ZILRETTA ) intra-articular injection 32 mg  32 mg Intra-articular Once Raulkar, Sven SQUIBB, MD         Objective:  Psychiatric Specialty Exam:  General Appearance: appears at stated age, casually dressed and groomed   Behavior: pleasant and cooperative   Psychomotor Activity: no psychomotor agitation or retardation noted   Eye Contact: fair  Speech: normal amount, volume and fluency    Mood: euthymic  Affect: congruent, pleasant and interactive   Thought Process: linear, goal directed, no circumstantial or tangential thought process noted, no racing thoughts or flight of ideas  Descriptions of Associations: intact   Thought Content Hallucinations: denies AH, VH , does not appear responding to stimuli  Delusions: no paranoia, delusions of control, grandeur, ideas of reference, thought broadcasting, and magical thinking  Suicidal Thoughts: denies SI, intention, plan  Homicidal Thoughts: denies HI, intention, plan   Alertness/Orientation: alert and fully oriented   Insight:  fair Judgment: fair  Memory: intact   Executive Functions  Concentration: intact  Attention Span: fair  Recall: intact  Fund of Knowledge: fair   Physical Exam  General: Pleasant, well-appearing. No acute distress. Pulmonary: Normal effort. No wheezing or rales. Skin: No obvious rash or lesions. Neuro: A&Ox3.No focal deficit.   Review of Systems  Myalgias    Metabolic Disorder Labs: Lab Results  Component Value Date   HGBA1C 4.9 02/27/2023   MPG 82 07/04/2016   MPG 100 06/27/2015   No results found for: PROLACTIN Lab Results  Component Value Date   CHOL 186 02/27/2023   TRIG 79 02/27/2023   HDL 55 02/27/2023   CHOLHDL 3.4 02/27/2023   VLDL 9 06/27/2015   LDLCALC 116 (H) 02/27/2023   LDLCALC 119 (H) 09/08/2021   Lab Results  Component Value Date   TSH 2.710 02/27/2023   TSH 1.530 12/24/2018    Therapeutic Level Labs: No results found for: CBMZ No results found for: LITHIUM No results found for: VALPROATE  Screenings:  GAD-7    Flowsheet Row Office Visit from 09/21/2022 in [facility] Counselor from 08/21/2022 in [facility] Office Visit from 08/09/2020 [facility] Office Visit from 07/19/2017 in [facility]  Total GAD-7 Score 17 21 9 14    PHQ2-9    Flowsheet Row Office Visit from 12/20/2023 in [facility] Office Visit from 11/13/2023 in [facility] Office Visit from 04/26/2023 in [facility] Clinical Support from 03/27/2023 in [facility] Office Visit from 09/21/2022 in [facility]  PHQ-2 Total Score 1 3 3 4 4   PHQ-9 Total Score -- 11 -- 14 16   Flowsheet Row CT Bone Marrow Biopsy from 06/06/2023 in [facility] from 10/23/2022 in [facility] Emergency Department at [facility] Counselor from 08/21/2022 in [facility]  C-SSRS RISK CATEGORY No Risk No Risk Low Risk   Ismael Franco, MD PGY-3 Psychiatry Resident

## 2024-02-25 ENCOUNTER — Other Ambulatory Visit: Payer: Self-pay

## 2024-02-25 ENCOUNTER — Ambulatory Visit (INDEPENDENT_AMBULATORY_CARE_PROVIDER_SITE_OTHER): Admitting: Psychiatry

## 2024-02-25 VITALS — BP 158/95

## 2024-02-25 DIAGNOSIS — F330 Major depressive disorder, recurrent, mild: Secondary | ICD-10-CM

## 2024-02-25 DIAGNOSIS — G4709 Other insomnia: Secondary | ICD-10-CM

## 2024-02-25 MED ORDER — BUPROPION HCL ER (XL) 150 MG PO TB24
150.0000 mg | ORAL_TABLET | Freq: Every day | ORAL | 2 refills | Status: DC
  Filled 2024-02-25: qty 30, 30d supply, fill #0

## 2024-02-27 NOTE — Addendum Note (Signed)
 Addended by: MERCY DOMINO A on: 02/27/2024 05:31 PM   Modules accepted: Level of Service

## 2024-03-06 ENCOUNTER — Ambulatory Visit (INDEPENDENT_AMBULATORY_CARE_PROVIDER_SITE_OTHER): Payer: Self-pay | Admitting: Nurse Practitioner

## 2024-03-06 ENCOUNTER — Other Ambulatory Visit: Payer: Self-pay

## 2024-03-06 ENCOUNTER — Encounter: Payer: Self-pay | Admitting: Nurse Practitioner

## 2024-03-06 ENCOUNTER — Other Ambulatory Visit (HOSPITAL_COMMUNITY)
Admission: RE | Admit: 2024-03-06 | Discharge: 2024-03-06 | Disposition: A | Discharge status: Home / Self Care | Source: Ambulatory Visit | Attending: Nurse Practitioner | Admitting: Nurse Practitioner

## 2024-03-06 VITALS — BP 81/51 | HR 81 | Temp 98.1°F | Wt 254.0 lb

## 2024-03-06 DIAGNOSIS — Z01419 Encounter for gynecological examination (general) (routine) without abnormal findings: Secondary | ICD-10-CM | POA: Insufficient documentation

## 2024-03-06 DIAGNOSIS — Z1151 Encounter for screening for human papillomavirus (HPV): Secondary | ICD-10-CM | POA: Insufficient documentation

## 2024-03-06 DIAGNOSIS — Z124 Encounter for screening for malignant neoplasm of cervix: Secondary | ICD-10-CM

## 2024-03-06 DIAGNOSIS — J4520 Mild intermittent asthma, uncomplicated: Secondary | ICD-10-CM | POA: Diagnosis not present

## 2024-03-06 DIAGNOSIS — Z13220 Encounter for screening for lipoid disorders: Secondary | ICD-10-CM | POA: Diagnosis not present

## 2024-03-06 DIAGNOSIS — I10 Essential (primary) hypertension: Secondary | ICD-10-CM

## 2024-03-06 DIAGNOSIS — L299 Pruritus, unspecified: Secondary | ICD-10-CM

## 2024-03-06 MED ORDER — DICLOFENAC SODIUM 3 % EX GEL
1.0000 | Freq: Every day | CUTANEOUS | 2 refills | Status: DC | PRN
  Filled 2024-03-06: qty 100, 30d supply, fill #0

## 2024-03-06 MED ORDER — ASPIRIN 81 MG PO CHEW
81.0000 mg | CHEWABLE_TABLET | Freq: Every day | ORAL | 2 refills | Status: AC
  Filled 2024-03-06: qty 30, 30d supply, fill #0
  Filled 2024-08-06: qty 30, 30d supply, fill #1
  Filled 2024-09-01 – 2024-09-04 (×3): qty 30, 30d supply, fill #0

## 2024-03-06 MED ORDER — ALBUTEROL SULFATE HFA 108 (90 BASE) MCG/ACT IN AERS
INHALATION_SPRAY | RESPIRATORY_TRACT | 5 refills | Status: DC
  Filled 2024-03-06: qty 18, 50d supply, fill #0

## 2024-03-06 MED ORDER — CYCLOBENZAPRINE HCL 5 MG PO TABS
5.0000 mg | ORAL_TABLET | Freq: Three times a day (TID) | ORAL | 0 refills | Status: DC | PRN
  Filled 2024-03-06: qty 30, 10d supply, fill #0

## 2024-03-06 MED ORDER — HYDROXYZINE HCL 25 MG PO TABS
25.0000 mg | ORAL_TABLET | Freq: Three times a day (TID) | ORAL | 11 refills | Status: DC | PRN
  Filled 2024-03-06: qty 30, 10d supply, fill #0

## 2024-03-06 NOTE — Progress Notes (Addendum)
 Subjective   Patient ID: Caroline Ryan, female    DOB: [birth year], 58 y.o.   MRN: [id number]  Chief Complaint  Patient presents with   Annual Exam    Referring provider: Oley Bascom RAMAN, NP  Caroline Ryan is a 58 y.o. female with Past Medical History: No date: Anemia No date: Asthma No date: Depression     Comment:  no meds No date: Depression No date: Eczema No date: Fibroid No date: Headache(784.0)     Comment:  otc prn med No date: History of blood transfusion No date: Hypertension No date: Knee pain, bilateral     Comment:  arthralgia knee pain bilateral No date: Lower extremity edema No date: Mitral regurgitation     Comment:  Mild to moderate by echo 07/2023 No date: Seasonal allergies No date: Sleep apnea No date: SVD (spontaneous vaginal delivery)     Comment:  x 3 05/2020: Vaginal polyp   HPI  Patient presents today for a physical.  Her BP is low today. Thinks she took an extra BP pill by accident.  She will count her pills when she gets home.  We discussed that she should not take another blood pressure pill this afternoon.  She does want a Pap smear and breast exam done today.  She is up-to-date on mammograms. Denies f/c/s, n/v/d, hemoptysis, PND, leg swelling Denies chest pain or edema       No Known Allergies  Immunization History  Administered Date(s) Administered   Influenza Whole 06/30/2009   Influenza,inj,Quad PF,6+ Mos 06/27/2015, 07/04/2016, 06/09/2018, 06/10/2019   PFIZER(Purple Top)SARS-COV-2 Vaccination 12/25/2019, 01/15/2020   Pfizer(Comirnaty )Fall Seasonal Vaccine 12 years and older 09/20/2022, 01/30/2024   Pneumococcal Polysaccharide-23 04/14/2008, 06/27/2015   Td 10/18/2005   Tdap 07/04/2016    Tobacco History: Social History   Tobacco Use  Smoking Status Former   Current packs/day: 0.00   Average packs/day: 0.3 packs/day for 2.0 years (0.5 ttl pk-yrs)   Types: Cigarettes   Start date: 08/20/2012   Quit date:  08/20/2014   Years since quitting: 9.5   Passive exposure: Never  Smokeless Tobacco Never   Counseling given: Not Answered   Outpatient Encounter Medications as of 03/06/2024  Medication Sig   acetaminophen  (TYLENOL ) 325 MG tablet Take 2 tablets (650 mg total) by mouth daily as needed for moderate pain (pain score 4-6), headache or fever.   buPROPion  (WELLBUTRIN  XL) 150 MG 24 hr tablet Take 1 tablet (150 mg total) by mouth daily.   carvedilol  (COREG ) 25 MG tablet Take 1 tablet (25 mg total) by mouth 2 (two) times daily.   cetirizine  (ZYRTEC ) 10 MG tablet Take 1 tablet (10 mg total) by mouth daily.   ciclopirox  (LOPROX ) 0.77 % cream Apply topically 2 (two) times daily.   COVID-19 mRNA vaccine, Pfizer, (COMIRNATY ) syringe Inject into the muscle.   diclofenac  (VOLTAREN ) 75 MG EC tablet Take 1 tablet (75 mg total) by mouth 2 (two) times daily.   Diclofenac  Sodium 3 % GEL Apply 1 Application topically daily as needed.   docusate sodium  (COLACE) 100 MG capsule Take 1 capsule (100 mg total) by mouth 2 (two) times daily as needed.   ferrous gluconate  (FERGON) 324 MG tablet Take 1 tablet (324 mg total) by mouth 2 (two) times daily with a meal.   furosemide  (LASIX ) 20 MG tablet Take 1 tablet (20 mg total) by mouth daily.   hydrocortisone  2.5 % cream Apply topically 2 (two) times daily.   ipratropium-albuterol  (DUONEB) 0.5-2.5 (  3) MG/3ML SOLN Take 3 mLs by nebulization every 6 (six) hours as needed.   linaclotide  (LINZESS ) 72 MCG capsule Take 1 capsule (72 mcg total) by mouth daily before breakfast.   meloxicam  (MOBIC ) 15 MG tablet Take 1 tablet (15 mg total) by mouth daily as needed for pain.   methocarbamol  (ROBAXIN -750) 750 MG tablet Take 1 tablet (750 mg total) by mouth 4 (four) times daily.   methylPREDNISolone  (MEDROL  DOSEPAK) 4 MG TBPK tablet Use as directed for 6 days on 01/10/2024 end on 01/15/2024   nitroGLYCERIN  (NITROSTAT ) 0.4 MG SL tablet Place 1 tablet (0.4 mg total) under the tongue every 5  (five) minutes as needed for chest pain (max 3 doses, if no relief call 911).   NONFORMULARY OR COMPOUNDED ITEM Diclofenac  3%, Gabapentin  5%, Lidocaine  5%, Menthol 1%.  Apply 1-2 pumps three to four times per day as needed   olmesartan  (BENICAR ) 40 MG tablet Take 1 tablet (40 mg total) by mouth daily.   Respiratory Therapy Supplies (NEBULIZER MASK ADULT) MISC Use this mask with your nebulizer.   spironolactone  (ALDACTONE ) 100 MG tablet Take 1 tablet (100 mg total) by mouth daily.   triamcinolone  cream (KENALOG ) 0.5 % Apply 1 Application topically 3 (three) times daily.   Vitamin D , Ergocalciferol , (DRISDOL ) 1.25 MG (50000 UNIT) CAPS capsule Take 1 capsule (50,000 Units total) by mouth every 7 (seven) days.   [DISCONTINUED] albuterol  (VENTOLIN  HFA) 108 (90 Base) MCG/ACT inhaler Inhale 1 puff every 6 hours as needed for shortness of breath   [DISCONTINUED] aspirin  (ASPIRIN  LOW DOSE) 81 MG chewable tablet Chew 1 tablet (81 mg total) by mouth daily.   [DISCONTINUED] cyclobenzaprine  (FLEXERIL ) 5 MG tablet Take 1 tablet (5 mg total) by mouth 3 (three) times daily as needed for muscle spasms.   [DISCONTINUED] hydrOXYzine  (ATARAX ) 25 MG tablet Take 1 tablet (25 mg total) by mouth 3 (three) times daily as needed.   albuterol  (VENTOLIN  HFA) 108 (90 Base) MCG/ACT inhaler Inhale 1 puff every 6 hours as needed for shortness of breath   aspirin  (ASPIRIN  LOW DOSE) 81 MG chewable tablet Chew 1 tablet (81 mg total) by mouth daily.   cyclobenzaprine  (FLEXERIL ) 5 MG tablet Take 1 tablet (5 mg total) by mouth 3 (three) times daily as needed for muscle spasms.   diclofenac  Sodium (VOLTAREN ) 1 % GEL Apply 4 g topically 4 (four) times daily. (Patient not taking: Reported on 12/20/2023)   fluticasone  (FLONASE ) 50 MCG/ACT nasal spray Place 1 spray into both nostrils daily. (Patient taking differently: Place 1 spray into both nostrils daily as needed for allergies.)   hydrOXYzine  (ATARAX ) 25 MG tablet Take 1 tablet (25 mg  total) by mouth 3 (three) times daily as needed.   ibuprofen  (ADVIL ) 800 MG tablet Take 1 tablet (800 mg total) by mouth 3 (three) times daily. (Patient not taking: Reported on 12/20/2023)   Facility-Administered Encounter Medications as of 03/06/2024  Medication   Triamcinolone  Acetonide (ZILRETTA ) intra-articular injection 32 mg   Triamcinolone  Acetonide (ZILRETTA ) intra-articular injection 32 mg    Review of Systems  Review of Systems  Constitutional: Negative.   HENT: Negative.    Cardiovascular: Negative.   Gastrointestinal: Negative.   Allergic/Immunologic: Negative.   Neurological: Negative.   Psychiatric/Behavioral: Negative.       Objective:   BP (!) 81/51   Pulse 81   Temp 98.1 F (36.7 C) (Oral)   Wt 254 lb (115.2 kg)   LMP 04/03/2017 (Approximate)   SpO2 99%   BMI  49.61 kg/m   Wt Readings from Last 5 Encounters:  03/06/24 254 lb (115.2 kg)  12/20/23 255 lb (115.7 kg)  11/28/23 248 lb (112.5 kg)  10/07/23 246 lb 12.8 oz (111.9 kg)  06/14/23 245 lb 3.2 oz (111.2 kg)     Physical Exam Vitals and nursing note reviewed. Exam conducted with a chaperone present.  Constitutional:      General: She is not in acute distress.    Appearance: She is well-developed.  Cardiovascular:     Rate and Rhythm: Normal rate and regular rhythm.  Pulmonary:     Effort: Pulmonary effort is normal.     Breath sounds: Normal breath sounds.  Chest:  Breasts:    Right: Normal.     Left: Normal.  Genitourinary:    General: Normal vulva.     Vagina: Normal.  Neurological:     Mental Status: She is alert and oriented to person, place, and time.       Assessment & Plan:   Lipid screening -     Lipid panel  Mild intermittent asthma, unspecified whether complicated -     Albuterol  Sulfate HFA; Inhale 1 puff every 6 hours as needed for shortness of breath  Dispense: 18 g; Refill: 5  Essential hypertension, benign -     CBC -     Comprehensive metabolic panel with  GFR  Pruritus -     hydrOXYzine  HCl; Take 1 tablet (25 mg total) by mouth 3 (three) times daily as needed.  Dispense: 30 tablet; Refill: 11  Cervical cancer screening -     Cytology - PAP  Other orders -     Cyclobenzaprine  HCl; Take 1 tablet (5 mg total) by mouth 3 (three) times daily as needed for muscle spasms.  Dispense: 30 tablet; Refill: 0 -     Diclofenac  Sodium; Apply 1 Application topically daily as needed.  Dispense: 100 g; Refill: 2 -     Aspirin ; Chew 1 tablet (81 mg total) by mouth daily.  Dispense: 30 tablet; Refill: 2     Return in about 6 months (around 09/06/2024).   Bascom GORMAN Borer, NP 03/06/2024

## 2024-03-06 NOTE — Addendum Note (Signed)
 Addended by: OLEY BASCOM RAMAN on: 03/06/2024 03:34 PM   Modules accepted: Orders

## 2024-03-09 ENCOUNTER — Other Ambulatory Visit: Payer: Self-pay

## 2024-03-09 ENCOUNTER — Ambulatory Visit: Payer: Self-pay | Admitting: Nurse Practitioner

## 2024-03-09 DIAGNOSIS — N289 Disorder of kidney and ureter, unspecified: Secondary | ICD-10-CM

## 2024-03-10 ENCOUNTER — Other Ambulatory Visit: Payer: Self-pay

## 2024-03-10 LAB — LIPID PANEL
Chol/HDL Ratio: 3.2 ratio (ref 0.0–4.4)
Cholesterol, Total: 201 mg/dL — ABNORMAL HIGH (ref 100–199)
HDL: 63 mg/dL (ref >39)
LDL Chol Calc (NIH): 123 mg/dL — ABNORMAL HIGH (ref 0–99)
Triglycerides: 83 mg/dL (ref 0–149)
VLDL Cholesterol Cal: 15 mg/dL (ref 5–40)

## 2024-03-10 LAB — COMPREHENSIVE METABOLIC PANEL WITH GFR
ALT: 15 IU/L (ref 0–32)
AST: 16 IU/L (ref 0–40)
Albumin: 4.3 g/dL (ref 3.8–4.9)
Alkaline Phosphatase: 100 IU/L (ref 44–121)
BUN/Creatinine Ratio: 38 — ABNORMAL HIGH (ref 12–28)
BUN: 50 mg/dL — ABNORMAL HIGH (ref 8–27)
Bilirubin Total: 0.3 mg/dL (ref 0.0–1.2)
CO2: 19 mmol/L — ABNORMAL LOW (ref 20–29)
Calcium: 10.7 mg/dL — ABNORMAL HIGH (ref 8.7–10.3)
Chloride: 107 mmol/L — ABNORMAL HIGH (ref 96–106)
Creatinine, Ser: 1.32 mg/dL — ABNORMAL HIGH (ref 0.57–1.00)
Globulin, Total: 3 g/dL (ref 1.5–4.5)
Glucose: 87 mg/dL (ref 70–99)
Potassium: 5.1 mmol/L (ref 3.5–5.2)
Sodium: 138 mmol/L (ref 134–144)
Total Protein: 7.3 g/dL (ref 6.0–8.5)
eGFR: 46 mL/min/1.73 — ABNORMAL LOW (ref >59)

## 2024-03-10 LAB — CBC
Hematocrit: 32.2 % — ABNORMAL LOW (ref 34.0–46.6)
Hemoglobin: 10.1 g/dL — ABNORMAL LOW (ref 11.1–15.9)
MCH: 31.6 pg (ref 26.6–33.0)
MCHC: 31.4 g/dL — ABNORMAL LOW (ref 31.5–35.7)
MCV: 101 fL — ABNORMAL HIGH (ref 79–97)
Platelets: 163 x10E3/uL (ref 150–450)
RBC: 3.2 x10E6/uL — ABNORMAL LOW (ref 3.77–5.28)
RDW: 11.6 % — ABNORMAL LOW (ref 11.7–15.4)
WBC: 6.5 x10E3/uL (ref 3.4–10.8)

## 2024-03-12 ENCOUNTER — Telehealth: Payer: Self-pay | Admitting: Nurse Practitioner

## 2024-03-12 DIAGNOSIS — M170 Bilateral primary osteoarthritis of knee: Secondary | ICD-10-CM | POA: Diagnosis not present

## 2024-03-12 NOTE — Telephone Encounter (Signed)
 Copied from CRM #[id number]. Topic: General - Call Back - No Documentation >> Mar 11, 2024  4:36 PM Tiffany S wrote: Reason for CRM: Patient is asking for handicap paperwork to be filled out. Please follow with patient

## 2024-03-13 ENCOUNTER — Telehealth: Payer: Self-pay

## 2024-03-13 NOTE — Telephone Encounter (Signed)
 Call completed

## 2024-03-16 ENCOUNTER — Telehealth: Payer: Self-pay | Admitting: Nurse Practitioner

## 2024-03-16 NOTE — Telephone Encounter (Signed)
 Copied from CRM #[id number]. Topic: General - Call Back - No Documentation >> Mar 16, 2024 11:42 AM Caroline Ryan wrote: Patient called.. wants to know if we can mail paperwork for handicap sticker instead of picking it up?

## 2024-03-18 ENCOUNTER — Other Ambulatory Visit: Payer: Self-pay

## 2024-03-18 DIAGNOSIS — M25569 Pain in unspecified knee: Secondary | ICD-10-CM | POA: Diagnosis not present

## 2024-03-18 DIAGNOSIS — Z01812 Encounter for preprocedural laboratory examination: Secondary | ICD-10-CM | POA: Diagnosis not present

## 2024-03-19 ENCOUNTER — Other Ambulatory Visit: Payer: Self-pay

## 2024-03-19 DIAGNOSIS — M1712 Unilateral primary osteoarthritis, left knee: Secondary | ICD-10-CM | POA: Diagnosis not present

## 2024-03-19 DIAGNOSIS — M25562 Pain in left knee: Secondary | ICD-10-CM | POA: Diagnosis not present

## 2024-03-19 LAB — CYTOLOGY - PAP
Comment: NEGATIVE
Diagnosis: NEGATIVE
Diagnosis: REACTIVE
High risk HPV: NEGATIVE

## 2024-03-19 MED ORDER — METHYLPREDNISOLONE 4 MG PO TBPK
ORAL_TABLET | ORAL | 0 refills | Status: DC
  Filled 2024-03-19: qty 21, 6d supply, fill #0

## 2024-03-20 ENCOUNTER — Other Ambulatory Visit: Payer: Self-pay

## 2024-03-23 ENCOUNTER — Other Ambulatory Visit: Payer: Self-pay

## 2024-03-23 ENCOUNTER — Ambulatory Visit: Payer: Self-pay | Admitting: Nurse Practitioner

## 2024-03-30 ENCOUNTER — Encounter: Admitting: Physical Medicine and Rehabilitation

## 2024-03-30 ENCOUNTER — Ambulatory Visit: Payer: Self-pay | Admitting: Nurse Practitioner

## 2024-03-30 DIAGNOSIS — I10 Essential (primary) hypertension: Secondary | ICD-10-CM | POA: Diagnosis not present

## 2024-03-30 DIAGNOSIS — G4733 Obstructive sleep apnea (adult) (pediatric): Secondary | ICD-10-CM | POA: Diagnosis not present

## 2024-04-01 DIAGNOSIS — I10 Essential (primary) hypertension: Secondary | ICD-10-CM | POA: Diagnosis not present

## 2024-04-01 DIAGNOSIS — G4733 Obstructive sleep apnea (adult) (pediatric): Secondary | ICD-10-CM | POA: Diagnosis not present

## 2024-04-06 ENCOUNTER — Telehealth: Payer: Self-pay | Admitting: *Deleted

## 2024-04-06 NOTE — Progress Notes (Unsigned)
 Care Guide Pharmacy Note  04/06/2024 Name: Caroline Ryan MRN: [id number] DOB: 1965/09/27  Referred By: Oley Bascom RAMAN, NP Reason for referral: Complex Care Management (Initial outreach to schedule referral with PharmD )   Caroline Ryan is a 58 y.o. year old female who is a primary care patient of Oley Bascom RAMAN, NP.  Caroline Ryan was referred to the pharmacist for assistance related to: abnormal kidney function.  An unsuccessful telephone outreach was attempted today to contact the patient who was referred to the pharmacy team for assistance with medication management. Additional attempts will be made to contact the patient.  Caroline Ryan  [facility]  [facility], [facility] Guide  Direct Dial: [phone]  Fax [phone]

## 2024-04-07 NOTE — Progress Notes (Unsigned)
 Care Guide Pharmacy Note  04/07/2024 Name: Caroline Ryan MRN: [id number] DOB: Sep 17, 1965  Referred By: Oley Bascom RAMAN, NP Reason for referral: Complex Care Management (Initial outreach to schedule referral with PharmD )   Caroline Ryan is a 58 y.o. year old female who is a primary care patient of Oley Bascom RAMAN, NP.  Caroline Ryan was referred to the pharmacist for assistance related to: abnormal kidney function   A second unsuccessful telephone outreach was attempted today to contact the patient who was referred to the pharmacy team for assistance with medication management. Additional attempts will be made to contact the patient.  Harlene Satterfield  [facility]  [facility], [facility] Guide  Direct Dial: [phone]  Fax [phone]

## 2024-04-08 NOTE — Progress Notes (Signed)
 Care Guide Pharmacy Note  04/08/2024 Name: Caroline Ryan MRN: [id number] DOB: 08-10-1966  Referred By: Oley Bascom RAMAN, NP Reason for referral: Complex Care Management (Initial outreach to schedule referral with PharmD ), Call Attempt #1, Call Attempt #2, and Call Attempt #3   Caroline Ryan is a 58 y.o. year old female who is a primary care patient of Oley Bascom RAMAN, NP.  Caroline Ryan was referred to the pharmacist for assistance related to: abnormal Kidney function   A third unsuccessful telephone outreach was attempted today to contact the patient who was referred to the pharmacy team for assistance with medication management. The Population Health team is pleased to engage with this patient at any time in the future upon receipt of referral and should he/she be interested in assistance from the Population Health team.  Caroline Ryan  [facility]  [facility], [facility] Guide  Direct Dial: [phone]  Fax [phone]

## 2024-04-10 ENCOUNTER — Ambulatory Visit: Payer: Self-pay | Admitting: Nurse Practitioner

## 2024-04-28 ENCOUNTER — Encounter: Admitting: Physical Medicine and Rehabilitation

## 2024-04-28 ENCOUNTER — Other Ambulatory Visit: Payer: Self-pay | Admitting: Nurse Practitioner

## 2024-04-28 DIAGNOSIS — Z1231 Encounter for screening mammogram for malignant neoplasm of breast: Secondary | ICD-10-CM

## 2024-04-30 ENCOUNTER — Other Ambulatory Visit: Payer: Self-pay

## 2024-05-01 ENCOUNTER — Ambulatory Visit (INDEPENDENT_AMBULATORY_CARE_PROVIDER_SITE_OTHER): Payer: Self-pay | Admitting: Nurse Practitioner

## 2024-05-01 ENCOUNTER — Encounter: Payer: Self-pay | Admitting: Nurse Practitioner

## 2024-05-01 ENCOUNTER — Other Ambulatory Visit (HOSPITAL_COMMUNITY): Payer: Self-pay

## 2024-05-01 ENCOUNTER — Other Ambulatory Visit: Payer: Self-pay

## 2024-05-01 VITALS — BP 127/76 | HR 67 | Ht 60.0 in | Wt 253.0 lb

## 2024-05-01 DIAGNOSIS — M25562 Pain in left knee: Secondary | ICD-10-CM

## 2024-05-01 DIAGNOSIS — G8929 Other chronic pain: Secondary | ICD-10-CM

## 2024-05-01 DIAGNOSIS — E66813 Obesity, class 3: Secondary | ICD-10-CM

## 2024-05-01 DIAGNOSIS — E559 Vitamin D deficiency, unspecified: Secondary | ICD-10-CM | POA: Diagnosis not present

## 2024-05-01 DIAGNOSIS — J4520 Mild intermittent asthma, uncomplicated: Secondary | ICD-10-CM

## 2024-05-01 DIAGNOSIS — M25561 Pain in right knee: Secondary | ICD-10-CM

## 2024-05-01 DIAGNOSIS — L299 Pruritus, unspecified: Secondary | ICD-10-CM | POA: Diagnosis not present

## 2024-05-01 DIAGNOSIS — Z6841 Body Mass Index (BMI) 40.0 and over, adult: Secondary | ICD-10-CM

## 2024-05-01 MED ORDER — ALBUTEROL SULFATE HFA 108 (90 BASE) MCG/ACT IN AERS
1.0000 | INHALATION_SPRAY | Freq: Four times a day (QID) | RESPIRATORY_TRACT | 5 refills | Status: DC | PRN
  Filled 2024-05-01: qty 18, 50d supply, fill #0
  Filled 2024-06-19: qty 18, 50d supply, fill #1
  Filled 2024-09-01: qty 6.7, 50d supply, fill #0
  Filled 2024-09-01: qty 18, 50d supply, fill #0

## 2024-05-01 MED ORDER — CYCLOBENZAPRINE HCL 5 MG PO TABS
5.0000 mg | ORAL_TABLET | Freq: Three times a day (TID) | ORAL | 0 refills | Status: DC | PRN
  Filled 2024-05-01: qty 30, 10d supply, fill #0

## 2024-05-01 MED ORDER — OXYCODONE-ACETAMINOPHEN 10-325 MG PO TABS
1.0000 | ORAL_TABLET | Freq: Three times a day (TID) | ORAL | 0 refills | Status: AC | PRN
  Filled 2024-05-01: qty 15, 5d supply, fill #0

## 2024-05-01 MED ORDER — VITAMIN D (ERGOCALCIFEROL) 1.25 MG (50000 UNIT) PO CAPS
50000.0000 [IU] | ORAL_CAPSULE | ORAL | 0 refills | Status: DC
  Filled 2024-05-01: qty 5, 35d supply, fill #0

## 2024-05-01 MED ORDER — MONTELUKAST SODIUM 10 MG PO TABS
10.0000 mg | ORAL_TABLET | Freq: Every day | ORAL | 3 refills | Status: AC
  Filled 2024-05-01: qty 30, 30d supply, fill #0

## 2024-05-01 MED ORDER — HYDROXYZINE HCL 25 MG PO TABS
25.0000 mg | ORAL_TABLET | Freq: Three times a day (TID) | ORAL | 11 refills | Status: DC | PRN
  Filled 2024-05-01: qty 30, 10d supply, fill #0

## 2024-05-01 MED ORDER — TRIAMCINOLONE ACETONIDE 0.5 % EX CREA
1.0000 | TOPICAL_CREAM | Freq: Three times a day (TID) | CUTANEOUS | 0 refills | Status: AC
  Filled 2024-05-01: qty 30, 10d supply, fill #0

## 2024-05-01 NOTE — Progress Notes (Signed)
 Subjective   Patient ID: Caroline Ryan, female    DOB: [birth year], 58 y.o.   MRN: [id number]  Chief Complaint  Patient presents with   Hypertension    Referring provider: Oley Bascom RAMAN, NP  Caroline Ryan is a 58 y.o. female with Past Medical History: No date: Anemia No date: Asthma No date: Depression     Comment:  no meds No date: Depression No date: Eczema No date: Fibroid No date: Headache(784.0)     Comment:  otc prn med No date: History of blood transfusion No date: Hypertension No date: Knee pain, bilateral     Comment:  arthralgia knee pain bilateral No date: Lower extremity edema No date: Mitral regurgitation     Comment:  Mild to moderate by echo 07/2023 No date: Seasonal allergies No date: Sleep apnea No date: SVD (spontaneous vaginal delivery)     Comment:  x 3 05/2020: Vaginal polyp  HPI  Patient presents today for a follow-up visit of hypertension.  Overall she is doing well.  She does need refills on medications including pain medicine for bilateral knee pain.  Patient does use this sparingly.  She is trying to lose weight and would like to start exercising more. Denies f/c/s, n/v/d, hemoptysis, PND, leg swelling Denies chest pain or edema    No Known Allergies  Immunization History  Administered Date(s) Administered   Influenza Whole 06/30/2009   Influenza,inj,Quad PF,6+ Mos 06/27/2015, 07/04/2016, 06/09/2018, 06/10/2019   PFIZER(Purple Top)SARS-COV-2 Vaccination 12/25/2019, 01/15/2020   Pfizer(Comirnaty )Fall Seasonal Vaccine 12 years and older 09/20/2022, 01/30/2024   Pneumococcal Polysaccharide-23 04/14/2008, 06/27/2015   Td 10/18/2005   Tdap 07/04/2016    Tobacco History: Social History   Tobacco Use  Smoking Status Former   Current packs/day: 0.00   Average packs/day: 0.3 packs/day for 2.0 years (0.5 ttl pk-yrs)   Types: Cigarettes   Start date: 08/20/2012   Quit date: 08/20/2014   Years since quitting: 9.7   Passive  exposure: Never  Smokeless Tobacco Never   Counseling given: Not Answered   Outpatient Encounter Medications as of 05/01/2024  Medication Sig   acetaminophen  (TYLENOL ) 325 MG tablet Take 2 tablets (650 mg total) by mouth daily as needed for moderate pain (pain score 4-6), headache or fever.   aspirin  (ASPIRIN  LOW DOSE) 81 MG chewable tablet Chew 1 tablet (81 mg total) by mouth daily.   buPROPion  (WELLBUTRIN  XL) 150 MG 24 hr tablet Take 1 tablet (150 mg total) by mouth daily.   carvedilol  (COREG ) 25 MG tablet Take 1 tablet (25 mg total) by mouth 2 (two) times daily.   cetirizine  (ZYRTEC ) 10 MG tablet Take 1 tablet (10 mg total) by mouth daily.   ciclopirox  (LOPROX ) 0.77 % cream Apply topically 2 (two) times daily.   diclofenac  Sodium (VOLTAREN ) 1 % GEL Apply 4 g topically 4 (four) times daily.   docusate sodium  (COLACE) 100 MG capsule Take 1 capsule (100 mg total) by mouth 2 (two) times daily as needed.   ferrous gluconate  (FERGON) 324 MG tablet Take 1 tablet (324 mg total) by mouth 2 (two) times daily with a meal.   fluticasone  (FLONASE ) 50 MCG/ACT nasal spray Place 1 spray into both nostrils daily. (Patient taking differently: Place 1 spray into both nostrils daily as needed for allergies.)   furosemide  (LASIX ) 20 MG tablet Take 1 tablet (20 mg total) by mouth daily. (Patient taking differently: Take 20 mg by mouth as needed.)   hydrocortisone  2.5 % cream Apply topically  2 (two) times daily.   ipratropium-albuterol  (DUONEB) 0.5-2.5 (3) MG/3ML SOLN Take 3 mLs by nebulization every 6 (six) hours as needed.   linaclotide  (LINZESS ) 72 MCG capsule Take 1 capsule (72 mcg total) by mouth daily before breakfast.   meloxicam  (MOBIC ) 15 MG tablet Take 1 tablet (15 mg total) by mouth daily as needed for pain.   methocarbamol  (ROBAXIN -750) 750 MG tablet Take 1 tablet (750 mg total) by mouth 4 (four) times daily.   montelukast  (SINGULAIR ) 10 MG tablet Take 1 tablet (10 mg total) by mouth at bedtime.    nitroGLYCERIN  (NITROSTAT ) 0.4 MG SL tablet Place 1 tablet (0.4 mg total) under the tongue every 5 (five) minutes as needed for chest pain (max 3 doses, if no relief call 911).   olmesartan  (BENICAR ) 40 MG tablet Take 1 tablet (40 mg total) by mouth daily.   oxyCODONE -acetaminophen  (PERCOCET) 10-325 MG tablet Take 1 tablet by mouth every 8 (eight) hours as needed for up to 5 days for pain.   Respiratory Therapy Supplies (NEBULIZER MASK ADULT) MISC Use this mask with your nebulizer.   spironolactone  (ALDACTONE ) 100 MG tablet Take 1 tablet (100 mg total) by mouth daily.   [DISCONTINUED] albuterol  (VENTOLIN  HFA) 108 (90 Base) MCG/ACT inhaler Inhale 1 puff every 6 hours as needed for shortness of breath   [DISCONTINUED] COVID-19 mRNA vaccine, Pfizer, (COMIRNATY ) syringe Inject into the muscle.   [DISCONTINUED] cyclobenzaprine  (FLEXERIL ) 5 MG tablet Take 1 tablet (5 mg total) by mouth 3 (three) times daily as needed for muscle spasms.   [DISCONTINUED] diclofenac  (VOLTAREN ) 75 MG EC tablet Take 1 tablet (75 mg total) by mouth 2 (two) times daily.   [DISCONTINUED] Diclofenac  Sodium 3 % GEL Apply 1 Application topically daily as needed.   [DISCONTINUED] hydrOXYzine  (ATARAX ) 25 MG tablet Take 1 tablet (25 mg total) by mouth 3 (three) times daily as needed.   [DISCONTINUED] methylPREDNISolone  (MEDROL  DOSEPAK) 4 MG TBPK tablet Take one tablet by mouth as directed   [DISCONTINUED] triamcinolone  cream (KENALOG ) 0.5 % Apply 1 Application topically 3 (three) times daily.   [DISCONTINUED] Vitamin D , Ergocalciferol , (DRISDOL ) 1.25 MG (50000 UNIT) CAPS capsule Take 1 capsule (50,000 Units total) by mouth every 7 (seven) days.   albuterol  (VENTOLIN  HFA) 108 (90 Base) MCG/ACT inhaler Inhale 1 puff every 6 hours as needed for shortness of breath   cyclobenzaprine  (FLEXERIL ) 5 MG tablet Take 1 tablet (5 mg total) by mouth 3 (three) times daily as needed for muscle spasms.   hydrOXYzine  (ATARAX ) 25 MG tablet Take 1 tablet  (25 mg total) by mouth 3 (three) times daily as needed.   NONFORMULARY OR COMPOUNDED ITEM Diclofenac  3%, Gabapentin  5%, Lidocaine  5%, Menthol 1%.  Apply 1-2 pumps three to four times per day as needed (Patient not taking: Reported on 05/01/2024)   triamcinolone  cream (KENALOG ) 0.5 % Apply 1 Application topically 3 (three) times daily.   Vitamin D , Ergocalciferol , (DRISDOL ) 1.25 MG (50000 UNIT) CAPS capsule Take 1 capsule (50,000 Units total) by mouth every 7 (seven) days.   [DISCONTINUED] ibuprofen  (ADVIL ) 800 MG tablet Take 1 tablet (800 mg total) by mouth 3 (three) times daily. (Patient not taking: Reported on 12/20/2023)   Facility-Administered Encounter Medications as of 05/01/2024  Medication   Triamcinolone  Acetonide (ZILRETTA ) intra-articular injection 32 mg   Triamcinolone  Acetonide (ZILRETTA ) intra-articular injection 32 mg    Review of Systems  Review of Systems  Constitutional: Negative.   HENT: Negative.    Cardiovascular: Negative.   Gastrointestinal: Negative.  Allergic/Immunologic: Negative.   Neurological: Negative.   Psychiatric/Behavioral: Negative.       Objective:   BP 127/76   Pulse 67   Ht 5' (1.524 m)   Wt 253 lb (114.8 kg)   LMP 04/03/2017 (Approximate)   BMI 49.41 kg/m   Wt Readings from Last 5 Encounters:  05/01/24 253 lb (114.8 kg)  03/06/24 254 lb (115.2 kg)  12/20/23 255 lb (115.7 kg)  11/28/23 248 lb (112.5 kg)  10/07/23 246 lb 12.8 oz (111.9 kg)     Physical Exam Vitals and nursing note reviewed.  Constitutional:      General: She is not in acute distress.    Appearance: She is well-developed.  Cardiovascular:     Rate and Rhythm: Normal rate and regular rhythm.  Pulmonary:     Effort: Pulmonary effort is normal.     Breath sounds: Normal breath sounds.  Neurological:     Mental Status: She is alert and oriented to person, place, and time.       Assessment & Plan:   Bilateral chronic knee pain -     Ambulatory referral to  Physical Medicine Rehab  Mild intermittent asthma, unspecified whether complicated -     Albuterol  Sulfate HFA; Inhale 1 puff every 6 hours as needed for shortness of breath  Dispense: 18 g; Refill: 5  Pruritus -     hydrOXYzine  HCl; Take 1 tablet (25 mg total) by mouth 3 (three) times daily as needed.  Dispense: 30 tablet; Refill: 11  Vitamin D  deficiency -     Vitamin D  (Ergocalciferol ); Take 1 capsule (50,000 Units total) by mouth every 7 (seven) days.  Dispense: 5 capsule; Refill: 0  Class 3 severe obesity without serious comorbidity with body mass index (BMI) of 45.0 to 49.9 in adult, unspecified obesity type -     Ambulatory referral to Physical Medicine Rehab -     Amb Ref to Medical Weight Management  Other orders -     Cyclobenzaprine  HCl; Take 1 tablet (5 mg total) by mouth 3 (three) times daily as needed for muscle spasms.  Dispense: 30 tablet; Refill: 0 -     Triamcinolone  Acetonide; Apply 1 Application topically 3 (three) times daily.  Dispense: 30 g; Refill: 0 -     Montelukast  Sodium; Take 1 tablet (10 mg total) by mouth at bedtime.  Dispense: 30 tablet; Refill: 3 -     oxyCODONE -Acetaminophen ; Take 1 tablet by mouth every 8 (eight) hours as needed for up to 5 days for pain.  Dispense: 15 tablet; Refill: 0     Return in about 3 months (around 07/31/2024).   Bascom GORMAN Borer, NP 05/01/2024

## 2024-05-04 NOTE — Progress Notes (Signed)
 BH MD Outpatient Progress Note  05/12/2024 10:53 AM [facility]  MRN: [id number]  Assessment:  Caroline Ryan [facility] presents for follow-up evaluation in-person. Patient is doing fairly amidst her psychosocial stressors of a family members health declining and her daughter's partner passing away. She is coping appropriately and utilizing her support system and leaning into her religion for support. Patient is starting therapy with me soon in which we can further discuss grounding techniques. No medication changes today, f/u in 2-3 months.   Plan:  # Major depressive disorder, recurrent, mild vs. Adjustment disorder with mixed depressed and anxious mood # Insomnia- OSA on CPAP Past medication trials: escitalopram  - unfavorable weight gain Interventions: -- Continue Wellbutrin  ER 150 mg daily  -- Vitamin B 12, folate, and vitamin D  level WNL on 12/2023   # Trauma and stressor-related disorder Interventions: -- will start seeing therapy with this provider on 9/30  Patient was reminded of contact information for behavioral health clinic and was instructed to call [phone] or [phone] for emergencies.   Identifying Information: Caroline Ryan is a 58 y.o. y.o. female with a past psychiatric history of MDD and GAD who is an established patient with [facility] for medication management.   Subjective:  Interval History:  Patient seen alone.  Patient reports feeling okay today. Since the previous visit, she states her grandchildren have been stressing her out. She came back from vacation to [place]  and she enjoyed her vacation.    Regarding psychiatric symptoms, she notes feeling more sad since the prior visit. She states she is worried about her father who is declining in health. Her daughter's partner passed away last week due to cancer. She states that she is reading her Bible in Bible study more often and leaning into her support group. Patient reports the  medications are helpful as well in keeping her steady. Patient reports the following adverse effects: denies.   Patient reports good sleep. Patient reports good appetite. She notes planning on incorporating more of a balanced diet.   Patient denies current SI, HI, and AVH.   Substance use: denies  Visit Diagnosis:    ICD-10-CM   1. MDD (major depressive disorder), recurrent episode, mild  F33.0        Past Psychiatric History: Previous Psych Diagnoses: Recently diagnosed with MDD by therapist at [facility] Prior inpatient treatment: Denies Psychotherapy hx: Getting therapy with Caroline Ryan at [facility] Previous suicidal attempts: Denies Previous medication trials: None History of detox: N/A History of rehab: N/A  Substance Abuse History: Formerly smoked, quit 2016. Former cannabis use, quit in 2019.   Social history: Lives with son sometimes and he helps with transportation. Her daughter lives around here too. Patient lives in an apartment. Job: unemployed Married/Children: single, two sons and one daughter Support: Son   Past Medical History:  Past Medical History:  Diagnosis Date   Anemia    Asthma    Depression    no meds   Depression    Eczema    Fibroid    Headache(784.0)    otc prn med   History of blood transfusion    Hypertension    Knee pain, bilateral    arthralgia knee pain bilateral   Lower extremity edema    Mitral regurgitation    Mild to moderate by echo 07/2023   Seasonal allergies    Sleep apnea    SVD (spontaneous vaginal delivery)    x 3   Vaginal  polyp 05/2020    Past Surgical History:  Procedure Laterality Date   ABDOMINAL HYSTERECTOMY     BILATERAL SALPINGECTOMY Bilateral 10/10/2017   Procedure: BILATERAL SALPINGECTOMY;  Surgeon: Caroline Ozell CROME, MD;  Location: [facility];  Service: Gynecology;  Laterality: Bilateral;   BREAST BIOPSY     us  guided core 2009   BREAST EXCISIONAL BIOPSY     right 1982 left 1981   BREAST SURGERY      bilateral cysts/benign   COLONOSCOPY  10/17/2021   VAGINAL HYSTERECTOMY N/A 10/10/2017   Procedure: HYSTERECTOMY VAGINAL;  Surgeon: Caroline Ozell CROME, MD;  Location: [facility];  Service: Gynecology;  Laterality: N/A;   Family Psychiatric History: Psych: Son- Schizophrenia, he comes to [facility] too   Family History:  Family History  Problem Relation Age of Onset   Breast cancer Mother 14   Diabetes Father    Hypertension Father    Diabetes Brother    Diabetes Brother    Other Neg Hx    Colon polyps Neg Hx    Colon cancer Neg Hx    Esophageal cancer Neg Hx    Stomach cancer Neg Hx    Rectal cancer Neg Hx     Social History: Social History   Socioeconomic History   Marital status: Single    Spouse name: Not on file   Number of children: 3   Years of education: Not on file   Highest education level: Not on file  Occupational History   Not on file  Tobacco Use   Smoking status: Former    Current packs/day: 0.00    Average packs/day: 0.3 packs/day for 2.0 years (0.5 ttl pk-yrs)    Types: Cigarettes    Start date: 08/20/2012    Quit date: 08/20/2014    Years since quitting: 9.7    Passive exposure: Never   Smokeless tobacco: Never  Vaping Use   Vaping status: Never Used  Substance and Sexual Activity   Alcohol use: Yes    Alcohol/week: 2.0 standard drinks of alcohol    Types: 2 Glasses of wine per week    Comment: once a week   Drug use: No   Sexual activity: Yes    Birth control/protection: Condom  Other Topics Concern   Not on file  Social History Narrative   Not on file   Social Drivers of Health   Financial Resource Strain: High Risk (08/21/2022)   Overall Financial Resource Strain (CARDIA)    Difficulty of Paying Living Expenses: Very hard  Food Insecurity: Food Insecurity Present (08/21/2022)   Hunger Vital Sign    Worried About Running Out of Food in the Last Year: Often true    Ran Out of Food in the Last Year: Often true  Transportation Needs: No Transportation  Needs (08/21/2022)   PRAPARE - Administrator, Civil Service (Medical): No    Lack of Transportation (Non-Medical): No  Physical Activity: Inactive (08/21/2022)   Exercise Vital Sign    Days of Exercise per Week: 0 days    Minutes of Exercise per Session: 0 min  Stress: Stress Concern Present (07/27/2022)   [employer] of Occupational Health - Occupational Stress Questionnaire    Feeling of Stress : Very much  Social Connections: Socially Isolated (08/21/2022)   Social Connection and Isolation Panel    Frequency of Communication with Friends and Family: More than three times a week    Frequency of Social Gatherings with Friends and Family: Never  Attends Religious Services: Never    Active Member of Clubs or Organizations: No    Attends Banker Meetings: Never    Marital Status: Never married    Allergies:  No Known Allergies  Current Medications: Current Outpatient Medications  Medication Sig Dispense Refill   acetaminophen  (TYLENOL ) 325 MG tablet Take 2 tablets (650 mg total) by mouth daily as needed for moderate pain (pain score 4-6), headache or fever. 100 tablet 0   albuterol  (VENTOLIN  HFA) 108 (90 Base) MCG/ACT inhaler Inhale 1 puff every 6 hours as needed for shortness of breath 18 g 5   aspirin  (ASPIRIN  LOW DOSE) 81 MG chewable tablet Chew 1 tablet (81 mg total) by mouth daily. 30 tablet 2   buPROPion  (WELLBUTRIN  XL) 150 MG 24 hr tablet Take 1 tablet (150 mg total) by mouth daily. 30 tablet 2   carvedilol  (COREG ) 25 MG tablet Take 1 tablet (25 mg total) by mouth 2 (two) times daily. 180 tablet 3   cetirizine  (ZYRTEC ) 10 MG tablet Take 1 tablet (10 mg total) by mouth daily. 30 tablet 6   ciclopirox  (LOPROX ) 0.77 % cream Apply topically 2 (two) times daily. 15 g 0   cyclobenzaprine  (FLEXERIL ) 5 MG tablet Take 1 tablet (5 mg total) by mouth 3 (three) times daily as needed for muscle spasms. 30 tablet 0   diclofenac  Sodium (VOLTAREN ) 1 % GEL Apply 4 g  topically 4 (four) times daily. 100 g 3   docusate sodium  (COLACE) 100 MG capsule Take 1 capsule (100 mg total) by mouth 2 (two) times daily as needed. 30 capsule 2   ferrous gluconate  (FERGON) 324 MG tablet Take 1 tablet (324 mg total) by mouth 2 (two) times daily with a meal. 180 tablet 3   fluticasone  (FLONASE ) 50 MCG/ACT nasal spray Place 1 spray into both nostrils daily. (Patient taking differently: Place 1 spray into both nostrils daily as needed for allergies.) 16 g 2   furosemide  (LASIX ) 20 MG tablet Take 1 tablet (20 mg total) by mouth daily. (Patient taking differently: Take 20 mg by mouth as needed.) 30 tablet 6   hydrocortisone  2.5 % cream Apply topically 2 (two) times daily. 30 g 1   hydrOXYzine  (ATARAX ) 25 MG tablet Take 1 tablet (25 mg total) by mouth 3 (three) times daily as needed. 30 tablet 11   ipratropium-albuterol  (DUONEB) 0.5-2.5 (3) MG/3ML SOLN Take 3 mLs by nebulization every 6 (six) hours as needed. 360 mL 0   linaclotide  (LINZESS ) 72 MCG capsule Take 1 capsule (72 mcg total) by mouth daily before breakfast. 30 capsule 0   meloxicam  (MOBIC ) 15 MG tablet Take 1 tablet (15 mg total) by mouth daily as needed for pain. 90 tablet 3   methocarbamol  (ROBAXIN -750) 750 MG tablet Take 1 tablet (750 mg total) by mouth 4 (four) times daily. 120 tablet 3   montelukast  (SINGULAIR ) 10 MG tablet Take 1 tablet (10 mg total) by mouth at bedtime. 30 tablet 3   nitroGLYCERIN  (NITROSTAT ) 0.4 MG SL tablet Place 1 tablet (0.4 mg total) under the tongue every 5 (five) minutes as needed for chest pain (max 3 doses, if no relief call 911). 25 tablet 5   NONFORMULARY OR COMPOUNDED ITEM Diclofenac  3%, Gabapentin  5%, Lidocaine  5%, Menthol 1%.  Apply 1-2 pumps three to four times per day as needed (Patient not taking: Reported on 05/01/2024) 1 each 0   olmesartan  (BENICAR ) 40 MG tablet Take 1 tablet (40 mg total) by mouth daily. 90  tablet 3   Respiratory Therapy Supplies (NEBULIZER MASK ADULT) MISC Use this  mask with your nebulizer. 1 each 0   spironolactone  (ALDACTONE ) 100 MG tablet Take 1 tablet (100 mg total) by mouth daily. 90 tablet 3   triamcinolone  cream (KENALOG ) 0.5 % Apply 1 Application topically 3 (three) times daily. 30 g 0   Vitamin D , Ergocalciferol , (DRISDOL ) 1.25 MG (50000 UNIT) CAPS capsule Take 1 capsule (50,000 Units total) by mouth every 7 (seven) days. 5 capsule 0   Current Facility-Administered Medications  Medication Dose Route Frequency Provider Last Rate Last Admin   Triamcinolone  Acetonide (ZILRETTA ) intra-articular injection 32 mg  32 mg Intra-articular Once Raulkar, Sven SQUIBB, MD       Triamcinolone  Acetonide (ZILRETTA ) intra-articular injection 32 mg  32 mg Intra-articular Once Raulkar, Sven SQUIBB, MD         Objective:  Psychiatric Specialty Exam: General Appearance: appears at stated age, casually dressed and groomed   Behavior: pleasant and cooperative   Psychomotor Activity: no psychomotor agitation or retardation noted   Eye Contact: fair  Speech: normal amount, volume and fluency    Mood: depressed  Affect: congruent, tearful at times  Thought Process: linear, goal directed, no circumstantial or tangential thought process noted, no racing thoughts or flight of ideas  Descriptions of Associations: intact   Thought Content Hallucinations: denies AH, VH , does not appear responding to stimuli  Delusions: no paranoia, delusions of control, grandeur, ideas of reference, thought broadcasting, and magical thinking  Suicidal Thoughts: denies SI, intention, plan  Homicidal Thoughts: denies HI, intention, plan   Alertness/Orientation: alert and fully oriented   Insight: fair Judgment: fair  Memory: intact   Executive Functions  Concentration: intact  Attention Span: fair  Recall: intact  Fund of Knowledge: fair   Physical Exam  General: Pleasant, well-appearing . No acute distress. Pulmonary: Normal effort. No wheezing or rales. Skin: No  obvious rash or lesions. Neuro: A&Ox3.No focal deficit.  Review of Systems  No reported symptoms   Metabolic Disorder Labs: Lab Results  Component Value Date   HGBA1C 4.9 02/27/2023   MPG 82 07/04/2016   MPG 100 06/27/2015   No results found for: PROLACTIN Lab Results  Component Value Date   CHOL 201 (H) 03/06/2024   TRIG 83 03/06/2024   HDL 63 03/06/2024   CHOLHDL 3.2 03/06/2024   VLDL 9 06/27/2015   LDLCALC 123 (H) 03/06/2024   LDLCALC 116 (H) 02/27/2023   Lab Results  Component Value Date   TSH 2.710 02/27/2023   TSH 1.530 12/24/2018    Therapeutic Level Labs: No results found for: CBMZ No results found for: LITHIUM No results found for: VALPROATE  Screenings:  GAD-7    Flowsheet Row Office Visit from 09/21/2022 in [facility] Counselor from 08/21/2022 in [facility] Office Visit [facility]  Total GAD-7 Score 17 21 9 14    PHQ2-9    Flowsheet Row Office Visit from 12/20/2023 in [facility] Office Visit from 11/13/2023 in [facility] Office Visit from 04/26/2023 in [facility] Clinical Support from 03/27/2023 in [facility] Office Visit from 09/21/2022 in [place]  PHQ-2 Total Score 1 3 3 4  4  PHQ-9 Total Score -- 11 -- 14 16   Flowsheet Row CT Bone Marrow Biopsy from 06/06/2023 in [facility] from 10/23/2022 in [facility] Counselor from 08/21/2022 in [facility]  C-SSRS RISK CATEGORY No Risk No Risk Low Risk   Ismael Franco, MD PGY-3 Psychiatry Resident

## 2024-05-05 ENCOUNTER — Encounter (HOSPITAL_COMMUNITY): Payer: Self-pay

## 2024-05-05 ENCOUNTER — Ambulatory Visit (HOSPITAL_COMMUNITY): Admitting: Psychiatry

## 2024-05-12 ENCOUNTER — Other Ambulatory Visit: Payer: Self-pay

## 2024-05-12 ENCOUNTER — Encounter (HOSPITAL_COMMUNITY): Payer: Self-pay | Admitting: Psychiatry

## 2024-05-12 ENCOUNTER — Ambulatory Visit (INDEPENDENT_AMBULATORY_CARE_PROVIDER_SITE_OTHER): Admitting: Psychiatry

## 2024-05-12 VITALS — BP 148/80 | Wt 258.2 lb

## 2024-05-12 DIAGNOSIS — G8929 Other chronic pain: Secondary | ICD-10-CM

## 2024-05-12 DIAGNOSIS — M25561 Pain in right knee: Secondary | ICD-10-CM | POA: Diagnosis not present

## 2024-05-12 DIAGNOSIS — F330 Major depressive disorder, recurrent, mild: Secondary | ICD-10-CM

## 2024-05-12 DIAGNOSIS — M25562 Pain in left knee: Secondary | ICD-10-CM

## 2024-05-12 MED ORDER — BUPROPION HCL ER (XL) 150 MG PO TB24
150.0000 mg | ORAL_TABLET | Freq: Every day | ORAL | 2 refills | Status: DC
  Filled 2024-05-12 – 2024-06-23 (×2): qty 30, 30d supply, fill #0

## 2024-05-13 NOTE — Addendum Note (Signed)
 Addended by: MERCY DOMINO A on: 05/13/2024 08:54 AM   Modules accepted: Level of Service

## 2024-05-14 ENCOUNTER — Encounter: Admitting: Physical Medicine and Rehabilitation

## 2024-05-19 ENCOUNTER — Ambulatory Visit (HOSPITAL_COMMUNITY): Admitting: Psychiatry

## 2024-05-20 DIAGNOSIS — I10 Essential (primary) hypertension: Secondary | ICD-10-CM | POA: Diagnosis not present

## 2024-05-21 ENCOUNTER — Encounter (INDEPENDENT_AMBULATORY_CARE_PROVIDER_SITE_OTHER): Payer: Self-pay

## 2024-05-21 ENCOUNTER — Other Ambulatory Visit: Payer: Self-pay

## 2024-05-26 ENCOUNTER — Ambulatory Visit (INDEPENDENT_AMBULATORY_CARE_PROVIDER_SITE_OTHER): Admitting: Psychiatry

## 2024-05-26 DIAGNOSIS — F330 Major depressive disorder, recurrent, mild: Secondary | ICD-10-CM | POA: Diagnosis not present

## 2024-05-26 NOTE — Progress Notes (Signed)
 [facility] [address] [place] [address] Dept: [phone] Dept Fax: [phone]  Psychotherapy Progress Note  Patient ID: Caroline Ryan, female  DOB: [birth year], 58 y.o.  MRN: [id number]  05/26/2024 Start time: 8 AM End time: 9 AM  Method of Visit: Face-to-Face  Present: patient  Current Concerns: Anxiety about her physical health, communication with family  Current Symptoms: Anxiety  Psychiatric Specialty Exam: General Appearance: appears at stated age, casually dressed and groomed   Behavior: pleasant and cooperative   Psychomotor Activity: no psychomotor agitation or retardation noted   Eye Contact: fair  Speech: normal amount, volume and fluency    Mood: anxious Affect: congruent, pleasant and interactive   Thought Process: linear, goal directed, no circumstantial or tangential thought process noted, no racing thoughts or flight of ideas  Descriptions of Associations: intact   Thought Content Hallucinations: denies AH, VH , does not appear responding to stimuli  Delusions: no paranoia, delusions of control, grandeur, ideas of reference, thought broadcasting, and magical thinking  Suicidal Thoughts: denies SI, intention, plan  Homicidal Thoughts: denies HI, intention, plan   Alertness/Orientation: alert and fully oriented   Insight: fair Judgment: fair  Memory: intact   Executive Functions  Concentration: intact  Attention Span: fair  Recall: intact  Fund of Knowledge: fair    Diagnosis: MDD, GAD  Anticipated Frequency of Visits: every 2 weeks Anticipated Length of Treatment Episode: 6 months  Short Term Goals/Goals for Treatment Session:  - Coping strategies about her physical health and venting Patient discussed her previous experience with therapy and she prefers further therapy with some foundational skills and grounding.  She talks about her knees started becoming more painful  last year and she previously used an active and she has had to learn how to live with her knee pain. - Communication with her family Spoke about family dynamics.  She currently lives with her son and son's wife.  She states that her son's wife does not really interact with the group or takes care of the house as much as she would like.  She also at times gets in arguments with her daughter.  Progress Towards Goals: Initial  Treatment Intervention: Supportive therapy  Medical Necessity: Improved patient condition  Assessment Tools:    12/20/2023    8:32 AM 11/13/2023    1:58 PM 04/26/2023   11:01 AM  Depression screen PHQ 2/9  Decreased Interest 0 1 1  Down, Depressed, Hopeless 1 2 2   PHQ - 2 Score 1 3 3   Altered sleeping  1   Tired, decreased energy  1   Change in appetite  2   Feeling bad or failure about yourself   1   Trouble concentrating  2   Moving slowly or fidgety/restless  1   Suicidal thoughts  0   PHQ-9 Score  11   Difficult doing work/chores  Somewhat difficult    Failed to redirect to the Timeline version of the REVFS SmartLink. Flowsheet Row CT Bone Marrow Biopsy from 06/06/2023 in [facility] from 10/23/2022 in [facility] Emergency Department at [facility] Counselor from 08/21/2022 in [facility]  C-SSRS RISK CATEGORY No Risk No Risk Low Risk     Patient/Guardian was advised Release of Information must be obtained prior to any record release in order to collaborate their care with an outside provider. Patient/Guardian was advised if they have not already done so to  contact the registration department to sign all necessary forms in order for us  to release information regarding their care.   Consent: Patient/Guardian gives verbal consent for treatment and assignment of benefits for services provided during this visit. Patient/Guardian expressed understanding and agreed to proceed.   Plan: f/u in 2  weeks  Ismael KATHEE Franco, MD 05/26/2024

## 2024-06-01 ENCOUNTER — Ambulatory Visit
Admission: RE | Admit: 2024-06-01 | Discharge: 2024-06-01 | Disposition: A | Discharge status: Home / Self Care | Source: Ambulatory Visit | Attending: Nurse Practitioner | Admitting: Nurse Practitioner

## 2024-06-01 DIAGNOSIS — Z1231 Encounter for screening mammogram for malignant neoplasm of breast: Secondary | ICD-10-CM | POA: Diagnosis not present

## 2024-06-08 ENCOUNTER — Other Ambulatory Visit: Payer: Self-pay

## 2024-06-08 DIAGNOSIS — D631 Anemia in chronic kidney disease: Secondary | ICD-10-CM

## 2024-06-09 ENCOUNTER — Inpatient Hospital Stay: Payer: 59 | Attending: Hematology

## 2024-06-09 ENCOUNTER — Encounter (HOSPITAL_COMMUNITY): Payer: Self-pay

## 2024-06-09 ENCOUNTER — Ambulatory Visit (HOSPITAL_COMMUNITY): Admitting: Psychiatry

## 2024-06-09 DIAGNOSIS — E669 Obesity, unspecified: Secondary | ICD-10-CM | POA: Diagnosis not present

## 2024-06-09 DIAGNOSIS — D631 Anemia in chronic kidney disease: Secondary | ICD-10-CM | POA: Insufficient documentation

## 2024-06-09 DIAGNOSIS — N1831 Chronic kidney disease, stage 3a: Secondary | ICD-10-CM | POA: Diagnosis present

## 2024-06-09 DIAGNOSIS — Z79899 Other long term (current) drug therapy: Secondary | ICD-10-CM | POA: Insufficient documentation

## 2024-06-09 DIAGNOSIS — M1990 Unspecified osteoarthritis, unspecified site: Secondary | ICD-10-CM | POA: Insufficient documentation

## 2024-06-09 LAB — CBC WITH DIFFERENTIAL (CANCER CENTER ONLY)
Abs Immature Granulocytes: 0.02 K/uL (ref 0.00–0.07)
Basophils Absolute: 0.1 K/uL (ref 0.0–0.1)
Basophils Relative: 1 %
Eosinophils Absolute: 0.1 K/uL (ref 0.0–0.5)
Eosinophils Relative: 2 %
HCT: 32.1 % — ABNORMAL LOW (ref 36.0–46.0)
Hemoglobin: 10 g/dL — ABNORMAL LOW (ref 12.0–15.0)
Immature Granulocytes: 0 %
Lymphocytes Relative: 30 %
Lymphs Abs: 1.8 K/uL (ref 0.7–4.0)
MCH: 31.6 pg (ref 26.0–34.0)
MCHC: 31.2 g/dL (ref 30.0–36.0)
MCV: 101.6 fL — ABNORMAL HIGH (ref 80.0–100.0)
Monocytes Absolute: 0.6 K/uL (ref 0.1–1.0)
Monocytes Relative: 9 %
Neutro Abs: 3.5 K/uL (ref 1.7–7.7)
Neutrophils Relative %: 58 %
Platelet Count: 160 K/uL (ref 150–400)
RBC: 3.16 MIL/uL — ABNORMAL LOW (ref 3.87–5.11)
RDW: 12.3 % (ref 11.5–15.5)
WBC Count: 6.1 K/uL (ref 4.0–10.5)
nRBC: 0 % (ref 0.0–0.2)

## 2024-06-09 LAB — CMP (CANCER CENTER ONLY)
ALT: 14 U/L (ref 0–44)
AST: 13 U/L — ABNORMAL LOW (ref 15–41)
Albumin: 4.1 g/dL (ref 3.5–5.0)
Alkaline Phosphatase: 86 U/L (ref 38–126)
Anion gap: 4 — ABNORMAL LOW (ref 5–15)
BUN: 46 mg/dL — ABNORMAL HIGH (ref 6–20)
CO2: 26 mmol/L (ref 22–32)
Calcium: 10.6 mg/dL — ABNORMAL HIGH (ref 8.9–10.3)
Chloride: 109 mmol/L (ref 98–111)
Creatinine: 1.53 mg/dL — ABNORMAL HIGH (ref 0.44–1.00)
GFR, Estimated: 39 mL/min — ABNORMAL LOW (ref >60)
Glucose, Bld: 103 mg/dL — ABNORMAL HIGH (ref 70–99)
Potassium: 5.2 mmol/L — ABNORMAL HIGH (ref 3.5–5.1)
Sodium: 139 mmol/L (ref 135–145)
Total Bilirubin: 0.4 mg/dL (ref 0.0–1.2)
Total Protein: 7.2 g/dL (ref 6.5–8.1)

## 2024-06-09 LAB — FERRITIN: Ferritin: 151 ng/mL (ref 11–307)

## 2024-06-09 LAB — SAMPLE TO BLOOD BANK

## 2024-06-19 ENCOUNTER — Other Ambulatory Visit: Payer: Self-pay | Admitting: Nurse Practitioner

## 2024-06-19 ENCOUNTER — Other Ambulatory Visit: Payer: Self-pay

## 2024-06-19 DIAGNOSIS — E559 Vitamin D deficiency, unspecified: Secondary | ICD-10-CM

## 2024-06-19 NOTE — Telephone Encounter (Signed)
 Please advise [facility]

## 2024-06-22 ENCOUNTER — Other Ambulatory Visit: Payer: Self-pay

## 2024-06-22 MED ORDER — VITAMIN D (ERGOCALCIFEROL) 1.25 MG (50000 UNIT) PO CAPS
50000.0000 [IU] | ORAL_CAPSULE | ORAL | 0 refills | Status: AC
  Filled 2024-06-22: qty 5, 35d supply, fill #0

## 2024-06-22 MED ORDER — CYCLOBENZAPRINE HCL 5 MG PO TABS
5.0000 mg | ORAL_TABLET | Freq: Three times a day (TID) | ORAL | 0 refills | Status: AC | PRN
  Filled 2024-06-22: qty 30, 10d supply, fill #0

## 2024-06-23 ENCOUNTER — Other Ambulatory Visit: Payer: Self-pay

## 2024-06-23 ENCOUNTER — Ambulatory Visit (HOSPITAL_COMMUNITY): Admitting: Psychiatry

## 2024-06-26 ENCOUNTER — Other Ambulatory Visit (HOSPITAL_COMMUNITY): Payer: Self-pay

## 2024-06-26 MED ORDER — SPIRONOLACTONE 100 MG PO TABS
100.0000 mg | ORAL_TABLET | Freq: Every day | ORAL | 3 refills | Status: AC
  Filled 2024-06-26: qty 90, 90d supply, fill #0

## 2024-06-26 MED ORDER — DICLOFENAC SODIUM 1 % EX GEL
2.0000 g | Freq: Four times a day (QID) | CUTANEOUS | 3 refills | Status: AC
  Filled 2024-06-26: qty 100, 6d supply, fill #0
  Filled 2024-09-01: qty 100, 6d supply, fill #1
  Filled 2024-09-04: qty 100, 6d supply, fill #0

## 2024-06-26 MED ORDER — OLMESARTAN MEDOXOMIL 40 MG PO TABS
40.0000 mg | ORAL_TABLET | Freq: Every day | ORAL | 3 refills | Status: AC
  Filled 2024-06-26: qty 90, 90d supply, fill #0

## 2024-06-26 MED ORDER — CYCLOBENZAPRINE HCL 5 MG PO TABS
5.0000 mg | ORAL_TABLET | Freq: Three times a day (TID) | ORAL | 3 refills | Status: AC
  Filled 2024-06-26 – 2024-08-06 (×2): qty 90, 30d supply, fill #0
  Filled 2024-09-01: qty 90, 30d supply, fill #1
  Filled 2024-09-04: qty 90, 30d supply, fill #0

## 2024-06-26 MED ORDER — FLUTICASONE-SALMETEROL 230-21 MCG/ACT IN AERO
2.0000 | INHALATION_SPRAY | Freq: Two times a day (BID) | RESPIRATORY_TRACT | 3 refills | Status: DC
  Filled 2024-06-26: qty 12, 30d supply, fill #0

## 2024-06-26 MED ORDER — CARVEDILOL 25 MG PO TABS
25.0000 mg | ORAL_TABLET | Freq: Two times a day (BID) | ORAL | 3 refills | Status: AC
  Filled 2024-06-26: qty 180, 90d supply, fill #0

## 2024-06-26 MED ORDER — MONTELUKAST SODIUM 10 MG PO TABS
10.0000 mg | ORAL_TABLET | Freq: Every evening | ORAL | 3 refills | Status: AC
  Filled 2024-06-26: qty 90, 90d supply, fill #0

## 2024-06-26 MED ORDER — ROSUVASTATIN CALCIUM 10 MG PO TABS
10.0000 mg | ORAL_TABLET | Freq: Every day | ORAL | 3 refills | Status: AC
  Filled 2024-06-26 – 2024-09-17 (×2): qty 90, 90d supply, fill #0

## 2024-06-26 MED ORDER — HYDROCODONE-ACETAMINOPHEN 5-325 MG PO TABS
1.0000 | ORAL_TABLET | Freq: Two times a day (BID) | ORAL | 0 refills | Status: DC | PRN
  Filled 2024-06-26: qty 10, 5d supply, fill #0

## 2024-06-26 MED ORDER — LIRAGLUTIDE -WEIGHT MANAGEMENT 18 MG/3ML ~~LOC~~ SOPN
3.0000 mg | PEN_INJECTOR | Freq: Every day | SUBCUTANEOUS | 6 refills | Status: AC
  Filled 2024-06-26: qty 15, 30d supply, fill #0

## 2024-06-26 MED ORDER — MELOXICAM 15 MG PO TABS
15.0000 mg | ORAL_TABLET | Freq: Every day | ORAL | 3 refills | Status: AC
  Filled 2024-06-26: qty 90, 90d supply, fill #0

## 2024-06-26 MED ORDER — BUDESONIDE-FORMOTEROL FUMARATE 160-4.5 MCG/ACT IN AERO
1.0000 | INHALATION_SPRAY | Freq: Two times a day (BID) | RESPIRATORY_TRACT | 3 refills | Status: AC
  Filled 2024-06-26: qty 10.2, 60d supply, fill #0

## 2024-06-26 MED ORDER — HYDROXYZINE HCL 25 MG PO TABS
25.0000 mg | ORAL_TABLET | Freq: Three times a day (TID) | ORAL | 3 refills | Status: DC
  Filled 2024-06-26: qty 270, 90d supply, fill #0

## 2024-06-30 ENCOUNTER — Encounter: Admitting: Physical Medicine and Rehabilitation

## 2024-07-02 ENCOUNTER — Other Ambulatory Visit: Payer: Self-pay

## 2024-07-02 ENCOUNTER — Other Ambulatory Visit (HOSPITAL_COMMUNITY): Payer: Self-pay

## 2024-07-03 ENCOUNTER — Other Ambulatory Visit: Payer: Self-pay

## 2024-07-20 ENCOUNTER — Telehealth (HOSPITAL_COMMUNITY): Payer: Self-pay | Admitting: Cardiology

## 2024-07-20 ENCOUNTER — Ambulatory Visit (HOSPITAL_COMMUNITY): Attending: Cardiovascular Disease

## 2024-07-20 NOTE — Telephone Encounter (Signed)
 Patient NO SHOWED Echocardiogram on 07/20/24. 1 NO We will not reach out to reschedule due to HIGH NO SHOW RATE 19%. If patient calls back we will reinstate the order and schedule. Thank you.

## 2024-07-21 ENCOUNTER — Ambulatory Visit (HOSPITAL_COMMUNITY): Admitting: Psychiatry

## 2024-07-27 NOTE — Progress Notes (Deleted)
 BH MD Outpatient Progress Note  07/27/2024 10:24 AM Caroline Ryan [facility]  MRN: [id number]  Assessment:  Caroline Ryan presents for follow-up evaluation in-person. In the prior visit, no med changes were made. Today, ***.   Plan:  # Major depressive disorder, recurrent, mild vs. Adjustment disorder with mixed depressed and anxious mood # Insomnia- OSA on CPAP Past medication trials: escitalopram  - unfavorable weight gain Interventions: -- Continue Wellbutrin  ER 150 mg daily  -- Vitamin B 12, folate, and vitamin D  level WNL on 12/2023   # Trauma and stressor-related disorder Interventions: -- will start seeing therapy with this provider on 9/30  Patient was reminded of contact information for behavioral health clinic and was instructed to call [phone] for emergencies.   Identifying Information: Caroline Ryan is a 58 y.o. y.o. female with a past psychiatric history of MDD and GAD who is an established patient with [facility] for medication management.   Subjective:  Interval History:  Patient seen ***.  Patient reports feeling *** today. Since the previous visit, ***. Stressors include ***.   Regarding psychiatric symptoms, ***. Patient reports the medications are ***. Patient reports the following adverse effects: ***.   Patient reports *** sleep, ***. Patient reports *** appetite, ***.   Patient denies current SI, HI, and AVH. ***  Substance use: *** denies   Past Psychiatric History: Previous Psych Diagnoses: Recently diagnosed with MDD by therapist at [name] Prior inpatient treatment: Denies Psychotherapy hx: Getting therapy with Caroline Ryan at [facility] Previous suicidal attempts: Denies Previous medication trials: None History of detox: N/A History of rehab: N/A  Substance Abuse History: Formerly smoked, quit 2016. Former cannabis use, quit in 2019.   Social history: Lives with son sometimes and he helps with transportation.  Her daughter lives around here too. Patient lives in an apartment. Job: unemployed Married/Children: single, two sons and one daughter Support: Son   Past Medical History:  Past Medical History:  Diagnosis Date   Anemia    Asthma    Depression    no meds   Depression    Eczema    Fibroid    Headache(784.0)    otc prn med   History of blood transfusion    Hypertension    Knee pain, bilateral    arthralgia knee pain bilateral   Lower extremity edema    Mitral regurgitation    Mild to moderate by echo 07/2023   Seasonal allergies    Sleep apnea    SVD (spontaneous vaginal delivery)    x 3   Vaginal polyp 05/2020    Past Surgical History:  Procedure Laterality Date   ABDOMINAL HYSTERECTOMY     BILATERAL SALPINGECTOMY Bilateral 10/10/2017   Procedure: BILATERAL SALPINGECTOMY;  Surgeon: Lorence Ozell CROME, MD;  Location: [facility];  Service: Gynecology;  Laterality: Bilateral;   BREAST BIOPSY     us  guided core 2009   BREAST EXCISIONAL BIOPSY     right 1982 left 1981   BREAST SURGERY     bilateral cysts/benign   COLONOSCOPY  10/17/2021   VAGINAL HYSTERECTOMY N/A 10/10/2017   Procedure: HYSTERECTOMY VAGINAL;  Surgeon: Lorence Ozell CROME, MD;  Location: [facility];  Service: Gynecology;  Laterality: N/A;   Family Psychiatric History: Psych: Son- Schizophrenia, he comes to [facility]   Family History:  Family History  Problem Relation Age of Onset   Breast cancer Mother 69   Diabetes Father    Hypertension Father  Diabetes Brother    Diabetes Brother    Other Neg Hx    Colon polyps Neg Hx    Colon cancer Neg Hx    Esophageal cancer Neg Hx    Stomach cancer Neg Hx    Rectal cancer Neg Hx     Social History: Social History   Socioeconomic History   Marital status: Single    Spouse name: Not on file   Number of children: 3   Years of education: Not on file   Highest education level: Not on file  Occupational History   Not on file  Tobacco Use   Smoking status:  Former    Current packs/day: 0.00    Average packs/day: 0.3 packs/day for 2.0 years (0.5 ttl pk-yrs)    Types: Cigarettes    Start date: 08/20/2012    Quit date: 08/20/2014    Years since quitting: 9.9    Passive exposure: Never   Smokeless tobacco: Never  Vaping Use   Vaping status: Never Used  Substance and Sexual Activity   Alcohol use: Yes    Alcohol/week: 2.0 standard drinks of alcohol    Types: 2 Glasses of wine per week    Comment: once a week   Drug use: No   Sexual activity: Yes    Birth control/protection: Condom  Other Topics Concern   Not on file  Social History Narrative   Not on file   Social Drivers of Health   Financial Resource Strain: High Risk (08/21/2022)   Overall Financial Resource Strain (CARDIA)    Difficulty of Paying Living Expenses: Very hard  Food Insecurity: Food Insecurity Present (08/21/2022)   Hunger Vital Sign    Worried About Running Out of Food in the Last Year: Often true    Ran Out of Food in the Last Year: Often true  Transportation Needs: No Transportation Needs (08/21/2022)   PRAPARE - Administrator, Civil Service (Medical): No    Lack of Transportation (Non-Medical): No  Physical Activity: Inactive (08/21/2022)   Exercise Vital Sign    Days of Exercise per Week: 0 days    Minutes of Exercise per Session: 0 min  Stress: Stress Concern Present (07/27/2022)   [employer] of Occupational Health - Occupational Stress Questionnaire    Feeling of Stress : Very much  Social Connections: Socially Isolated (08/21/2022)   Social Connection and Isolation Panel    Frequency of Communication with Friends and Family: More than three times a week    Frequency of Social Gatherings with Friends and Family: Never    Attends Religious Services: Never    Database Administrator or Organizations: No    Attends Engineer, Structural: Never    Marital Status: Never married    Allergies:  No Known Allergies  Current  Medications: Current Outpatient Medications  Medication Sig Dispense Refill   acetaminophen  (TYLENOL ) 325 MG tablet Take 2 tablets (650 mg total) by mouth daily as needed for moderate pain (pain score 4-6), headache or fever. 100 tablet 0   albuterol  (VENTOLIN  HFA) 108 (90 Base) MCG/ACT inhaler Inhale 1 puff every 6 hours as needed for shortness of breath 18 g 5   aspirin  (ASPIRIN  LOW DOSE) 81 MG chewable tablet Chew 1 tablet (81 mg total) by mouth daily. 30 tablet 2   budesonide -formoterol  (SYMBICORT ) 160-4.5 MCG/ACT inhaler Inhale 1 puff into the lungs 2 (two) times daily. 30.6 g 3   buPROPion  (WELLBUTRIN  XL) 150 MG 24 hr  tablet Take 1 tablet (150 mg total) by mouth daily. 30 tablet 2   carvedilol  (COREG ) 25 MG tablet Take 1 tablet (25 mg total) by mouth 2 (two) times daily. 180 tablet 3   carvedilol  (COREG ) 25 MG tablet Take 1 tablet (25 mg total) by mouth 2 (two) times daily with a meal. 180 tablet 3   cetirizine  (ZYRTEC ) 10 MG tablet Take 1 tablet (10 mg total) by mouth daily. 30 tablet 6   ciclopirox  (LOPROX ) 0.77 % cream Apply topically 2 (two) times daily. 15 g 0   cyclobenzaprine  (FLEXERIL ) 5 MG tablet Take 1 tablet (5 mg total) by mouth 3 (three) times daily as needed for muscle spasms. 30 tablet 0   cyclobenzaprine  (FLEXERIL ) 5 MG tablet Take 1 tablet (5 mg total) by mouth 3 (three) times daily. 90 tablet 3   diclofenac  Sodium (VOLTAREN ) 1 % GEL Apply 4 g topically 4 (four) times daily. 100 g 3   diclofenac  Sodium (VOLTAREN ) 1 % GEL Apply 2 g topically to affected areas 4 (four) times daily. 100 g 3   docusate sodium  (COLACE) 100 MG capsule Take 1 capsule (100 mg total) by mouth 2 (two) times daily as needed. 30 capsule 2   ferrous gluconate  (FERGON) 324 MG tablet Take 1 tablet (324 mg total) by mouth 2 (two) times daily with a meal. 180 tablet 3   fluticasone  (FLONASE ) 50 MCG/ACT nasal spray Place 1 spray into both nostrils daily. (Patient taking differently: Place 1 spray into both  nostrils daily as needed for allergies.) 16 g 2   furosemide  (LASIX ) 20 MG tablet Take 1 tablet (20 mg total) by mouth daily. (Patient taking differently: Take 20 mg by mouth as needed.) 30 tablet 6   HYDROcodone -acetaminophen  (NORCO/VICODIN) 5-325 MG tablet Take 1 tablet by mouth 2 (two) times daily as needed for pain. 10 tablet 0   hydrocortisone  2.5 % cream Apply topically 2 (two) times daily. 30 g 1   hydrOXYzine  (ATARAX ) 25 MG tablet Take 1 tablet (25 mg total) by mouth 3 (three) times daily as needed. 30 tablet 11   hydrOXYzine  (ATARAX ) 25 MG tablet Take 1 tablet (25 mg total) by mouth 3 (three) times daily. 270 tablet 3   ipratropium-albuterol  (DUONEB) 0.5-2.5 (3) MG/3ML SOLN Take 3 mLs by nebulization every 6 (six) hours as needed. 360 mL 0   linaclotide  (LINZESS ) 72 MCG capsule Take 1 capsule (72 mcg total) by mouth daily before breakfast. 30 capsule 0   Liraglutide  -Weight Management 18 MG/3ML SOPN Inject 3 mg subcutaneously every day in the abdomen, thigh, or upper arm. 15 mL 6   meloxicam  (MOBIC ) 15 MG tablet Take 1 tablet (15 mg total) by mouth daily as needed for pain. 90 tablet 3   meloxicam  (MOBIC ) 15 MG tablet Take 1 tablet (15 mg total) by mouth daily. 90 tablet 3   methocarbamol  (ROBAXIN ) 750 MG tablet Take 1 tablet (750 mg total) by mouth 4 (four) times daily. 120 tablet 3   montelukast  (SINGULAIR ) 10 MG tablet Take 1 tablet (10 mg total) by mouth at bedtime. 30 tablet 3   montelukast  (SINGULAIR ) 10 MG tablet Take 1 tablet (10 mg total) by mouth every evening. 90 tablet 3   nitroGLYCERIN  (NITROSTAT ) 0.4 MG SL tablet Place 1 tablet (0.4 mg total) under the tongue every 5 (five) minutes as needed for chest pain (max 3 doses, if no relief call 911). 25 tablet 5   NONFORMULARY OR COMPOUNDED ITEM Diclofenac  3%, Gabapentin  5%,  Lidocaine  5%, Menthol 1%.  Apply 1-2 pumps three to four times per day as needed (Patient not taking: Reported on 05/01/2024) 1 each 0   olmesartan  (BENICAR ) 40 MG  tablet Take 1 tablet (40 mg total) by mouth daily. 90 tablet 3   olmesartan  (BENICAR ) 40 MG tablet Take 1 tablet (40 mg total) by mouth daily. 90 tablet 3   Respiratory Therapy Supplies (NEBULIZER MASK ADULT) MISC Use this mask with your nebulizer. 1 each 0   rosuvastatin  (CRESTOR ) 10 MG tablet Take one tablet by mouth at bedtime 90 tablet 3   spironolactone  (ALDACTONE ) 100 MG tablet Take 1 tablet (100 mg total) by mouth daily. 90 tablet 3   spironolactone  (ALDACTONE ) 100 MG tablet Take 1 tablet by mouth daily. 90 tablet 3   triamcinolone  cream (KENALOG ) 0.5 % Apply 1 Application topically 3 (three) times daily. 30 g 0   Vitamin D , Ergocalciferol , (DRISDOL ) 1.25 MG (50000 UNIT) CAPS capsule Take 1 capsule (50,000 Units total) by mouth every 7 (seven) days. 5 capsule 0   Current Facility-Administered Medications  Medication Dose Route Frequency Provider Last Rate Last Admin   Triamcinolone  Acetonide (ZILRETTA ) intra-articular injection 32 mg  32 mg Intra-articular Once Raulkar, Sven SQUIBB, MD       Triamcinolone  Acetonide (ZILRETTA ) intra-articular injection 32 mg  32 mg Intra-articular Once Raulkar, Sven SQUIBB, MD         Objective:  Psychiatric Specialty Exam: General Appearance: appears at stated age, casually dressed and groomed ***  Behavior: pleasant and cooperative ***  Psychomotor Activity: no psychomotor agitation or retardation noted ***  Eye Contact: fair *** Speech: normal amount, volume and fluency ***   Mood: euthymic *** Affect: congruent, pleasant and interactive ***  Thought Process: linear, goal directed, no circumstantial or tangential thought process noted, no racing thoughts or flight of ideas *** Descriptions of Associations: intact ***  Thought Content Hallucinations: denies AH, VH , does not appear responding to stimuli *** Delusions: no paranoia, delusions of control, grandeur, ideas of reference, thought broadcasting, and magical thinking *** Suicidal  Thoughts: denies SI, intention, plan *** Homicidal Thoughts: denies HI, intention, plan ***  Alertness/Orientation: alert and fully oriented ***  Insight: fair*** Judgment: fair***  Memory: intact ***  Executive Functions  Concentration: intact *** Attention Span: fair *** Recall: intact *** Fund of Knowledge: fair ***  Physical Exam *** General: Pleasant, well-appearing. No acute distress. Pulmonary: Normal effort. No wheezing or rales. Skin: No obvious rash or lesions.*** Neuro: A&Ox3.No focal deficit.  Review of Systems *** No reported symptoms   Metabolic Disorder Labs: Lab Results  Component Value Date   HGBA1C 4.9 02/27/2023   MPG 82 07/04/2016   MPG 100 06/27/2015   No results found for: PROLACTIN Lab Results  Component Value Date   CHOL 201 (H) 03/06/2024   TRIG 83 03/06/2024   HDL 63 03/06/2024   CHOLHDL 3.2 03/06/2024   VLDL 9 06/27/2015   LDLCALC 123 (H) 03/06/2024   LDLCALC 116 (H) 02/27/2023   Lab Results  Component Value Date   TSH 2.710 02/27/2023   TSH 1.530 12/24/2018    Therapeutic Level Labs: No results found for: CBMZ No results found for: LITHIUM No results found for: VALPROATE  Screenings:  GAD-7    Flowsheet Row Office Visit from 09/21/2022 in [facility] Counselor from 08/21/2022 in [facility] Office Visit from 08[facility]09/2020 [facility]  from 07/19/2017 [facility]  Total GAD-7 Score 17 21 9 14    PHQ2-9    Flowsheet Row Office Visit from 12/20/2023 in [facility] Office Visit from 11/13/2023 in [facility] Office Visit from 04[facility]26/2023 in [facility] Clinical Support from 03/27/2023 in [facility] Office Visit from 09/21/2022 in  [facility]  PHQ-2 Total Score 1 3 3 4 4   PHQ-9 Total Score -- 11 -- 14 16   Flowsheet Row CT Bone Marrow Biopsy from 06/06/2023 in [place]  [facility] from 10/23/2022 in [facility] Counselor from 08/21/2022 in [facility]  C-SSRS RISK CATEGORY No Risk No Risk Low Risk   Ismael Franco, MD PGY-3 Psychiatry Resident

## 2024-07-28 ENCOUNTER — Encounter: Attending: Physical Medicine and Rehabilitation | Admitting: Physical Medicine and Rehabilitation

## 2024-07-29 ENCOUNTER — Ambulatory Visit (HOSPITAL_COMMUNITY)
Admission: RE | Admit: 2024-07-29 | Discharge: 2024-07-29 | Disposition: A | Discharge status: Home / Self Care | Source: Ambulatory Visit | Attending: Cardiology | Admitting: Cardiology

## 2024-07-29 DIAGNOSIS — I340 Nonrheumatic mitral (valve) insufficiency: Secondary | ICD-10-CM | POA: Insufficient documentation

## 2024-07-29 LAB — ECHOCARDIOGRAM COMPLETE
AR max vel: 2.78 cm2
AV Area VTI: 3.02 cm2
AV Area mean vel: 2.64 cm2
AV Mean grad: 4 mmHg
AV Peak grad: 7.1 mmHg
Ao pk vel: 1.33 m/s
Area-P 1/2: 2.45 cm2
S' Lateral: 3.22 cm

## 2024-07-29 NOTE — Telephone Encounter (Signed)
 Patient with echo appt for today 07/29/24.

## 2024-07-30 ENCOUNTER — Encounter: Payer: Self-pay | Admitting: Cardiology

## 2024-07-30 ENCOUNTER — Ambulatory Visit: Payer: Self-pay | Admitting: Cardiology

## 2024-07-31 ENCOUNTER — Other Ambulatory Visit (HOSPITAL_COMMUNITY): Payer: Self-pay

## 2024-07-31 ENCOUNTER — Ambulatory Visit: Payer: Self-pay | Admitting: Nurse Practitioner

## 2024-07-31 MED ORDER — ROSUVASTATIN CALCIUM 10 MG PO TABS: 10.0000 mg | ORAL_TABLET | Freq: Every day | ORAL | 3 refills | Status: AC

## 2024-08-04 ENCOUNTER — Encounter (HOSPITAL_COMMUNITY): Admitting: Psychiatry

## 2024-08-05 ENCOUNTER — Encounter: Payer: Self-pay | Admitting: *Deleted

## 2024-08-05 DIAGNOSIS — D649 Anemia, unspecified: Secondary | ICD-10-CM

## 2024-08-05 NOTE — Research (Signed)
 [employer] 2021-05 - Specimen Collection Study to Evaluate Biomarkers in Subjects with Cancer    Patient Caroline Ryan [facility] was identified by Dr. Lanny as a potential non-cancer cohort candidate for the above listed study.  This Clinical Research Nurse met with Deolinda Frid [place], [id number], on 08/05/2024 in a manner and location that ensures patient privacy to discuss participation in the above listed research study.  Patient is Accompanied by her daughter and grandson.  A copy of the informed consent document with embedded HIPAA language was provided to the patient.  Patient reads, speaks, and understands English.   Patient was provided with the business card of this Coordinator and encouraged to contact the research team with any questions.  Approximately 20 minutes were spent with the patient reviewing the informed consent documents.  Patient was provided the option of taking informed consent documents home to review and was encouraged to review at their convenience with their support network, including other care providers. Patient took the consent documents home to review. The pt said that she was interested in helping research.  The pt denied any history of cancer and any conditions that would make her at high risk for cancer.  The research team will call next week on 08/10/24 to see if she has any questions/concerns about the study.  Levon FREDRIK Sandifer RN, BSN, CCRP Clinical Research Nurse Lead 08/05/2024 5:35 PM

## 2024-08-06 ENCOUNTER — Other Ambulatory Visit (HOSPITAL_COMMUNITY): Payer: Self-pay

## 2024-08-06 ENCOUNTER — Other Ambulatory Visit: Payer: Self-pay | Admitting: Nurse Practitioner

## 2024-08-06 ENCOUNTER — Other Ambulatory Visit: Payer: Self-pay

## 2024-08-06 DIAGNOSIS — E559 Vitamin D deficiency, unspecified: Secondary | ICD-10-CM

## 2024-08-06 DIAGNOSIS — K5909 Other constipation: Secondary | ICD-10-CM

## 2024-08-06 MED ORDER — LIRAGLUTIDE -WEIGHT MANAGEMENT 18 MG/3ML ~~LOC~~ SOPN
3.0000 mg | PEN_INJECTOR | Freq: Every day | SUBCUTANEOUS | 6 refills | Status: AC
  Filled 2024-08-06: qty 15, 30d supply, fill #0

## 2024-08-07 ENCOUNTER — Other Ambulatory Visit (HOSPITAL_COMMUNITY): Payer: Self-pay

## 2024-08-07 ENCOUNTER — Other Ambulatory Visit: Payer: Self-pay

## 2024-08-07 MED ORDER — VITAMIN D (ERGOCALCIFEROL) 1.25 MG (50000 UNIT) PO CAPS
50000.0000 [IU] | ORAL_CAPSULE | ORAL | 0 refills | Status: AC
  Filled 2024-08-07: qty 5, 35d supply, fill #0

## 2024-08-07 MED ORDER — LINACLOTIDE 72 MCG PO CAPS
72.0000 ug | ORAL_CAPSULE | Freq: Every day | ORAL | 0 refills | Status: AC
  Filled 2024-08-07: qty 30, 30d supply, fill #0

## 2024-08-07 NOTE — Telephone Encounter (Signed)
 Please advise [facility]

## 2024-08-10 ENCOUNTER — Inpatient Hospital Stay: Attending: Hematology | Admitting: *Deleted

## 2024-08-10 ENCOUNTER — Inpatient Hospital Stay

## 2024-08-10 ENCOUNTER — Encounter: Payer: Self-pay | Admitting: *Deleted

## 2024-08-10 DIAGNOSIS — Z006 Encounter for examination for normal comparison and control in clinical research program: Secondary | ICD-10-CM

## 2024-08-10 LAB — RESEARCH LABS

## 2024-08-10 NOTE — Research (Signed)
 [employer] 2021-05 - Specimen Collection Study to Evaluate Biomarkers in Subjects with Cancer    This Nurse has reviewed this patient's inclusion and exclusion criteria as a second review and confirms Caroline Ryan is eligible for study participation.  Patient may continue with enrollment.  Delon Pinal BSN RN Clinical Research Nurse [facility] Direct Dial: [phone] 08/10/2024  12:20 PM

## 2024-08-10 NOTE — Telephone Encounter (Signed)
 Call to patient to review echo results, no answer. Left VM asking patient to call back per DPR.

## 2024-08-10 NOTE — Research (Signed)
 Trial Name:  [employer] 2021-05 - Specimen Collection Study to Evaluate Biomarkers in Subjects with Cancer    Patient Caroline Ryan was identified by Dr. Lanny as a potential candidate for the above listed study.  This Clinical Research Nurse met with Caroline Ryan, [id number] on 08/10/2024 in a manner and location that ensures patient privacy to discuss participation in the above listed research study.  Patient is Accompanied by her daughter, Caroline Ryan.  Patient was previously provided with informed consent documents.  Patient confirmed they have read the informed consent documents.  As outlined in the informed consent form, this Nurse and Clarita [place] discussed the purpose of the research study, the investigational nature of the study, study procedures and requirements for study participation, potential risks and benefits of study participation, as well as alternatives to participation.  This study is not blinded or double-blinded. The patient understands participation is voluntary and they may withdraw from study participation at any time.  This study does not involve randomization.  This study does not involve an investigational drug or device. This study does not involve a placebo. Patient understands enrollment is pending full eligibility review.   Confidentiality and how the patient's information will be used as part of study participation were discussed.  Patient was informed there is reimbursement provided for their time and effort spent on trial participation.    All questions were answered to patient's satisfaction.  The informed consent with embedded HIPAA language was reviewed page by page.  The patient's mental and emotional status is appropriate to provide informed consent, and the patient verbalizes an understanding of study participation.  Patient has agreed to participate in the above listed research study and has voluntarily signed the informed consent protocol  version 5, IRB approved  21 Apr 2024 with embedded HIPAA language on 08/10/2024 at 11:45 AM.  The patient was provided with a copy of the signed informed consent form with embedded HIPAA language for their reference.  No study specific procedures were obtained prior to the signing of the informed consent document.  Approximately 15 minutes were spent with the patient reviewing the informed consent documents.  Patient was not requested to complete a Release of Information form.  Eligibility: Eligibility criteria reviewed with patient. This nurse has reviewed this patient's inclusion and exclusion criteria and confirmed patient is eligible for study participation. Eligibility confirmed by enrolling investigator, who also agrees that patient should proceed with enrollment. Patient will continue with enrollment.  Data Collection: Patient was interviewed to collect the following information.   Has participant been diagnosed with: High Blood Pressure   Yes Coronary Artery Disease                No Myocardial Infarction                       No Congestive Heart Failure               No Peripheral Vascular Disease          No Cerebrovascular Disease              No Chronic Pulmonary Disease             Yes (Asthma) COPD (incl Emphysema,Chronic Bronchitis)    No Lupus                               No Rheumatoid Arthritis  No Rheumatoid Disease         No Diabetes                  No Dementia                         No Hemiplegia or Paraplegia No Barrett's Esophagus       No Gastric Ulcer                 No Peptic Ulcer Disease      No Mild Liver Disease           No Moderate or Severe Liver Disease  No Liver Cirrhosis                                 No Helicobacter Pylori (H. Pylori)          No Pancreatitis                                       No Renal Disease                                No Chronic Kidney Disease (CKD)   No Ulcerative Colitis     No Crohn's Disease     No Colorectal Polyps   Yes Lynch Syndrome    No Hepatitis B or C     No   Does the patient have a personal history of cancer (greater than 5 years ago)?  No  Does the patient have a family history of cancer in 1st or 2nd degree relatives? Yes If yes, Relationship(s) and Cancer type(s)? Mother had unknown type of cancer and Daughter has Breast Cancer.   Does the patient have history of alcohol consumption? Yes   If yes, current or former? Current Number of years? 46 Drinks per week? 4  Does the patient have history of cigarette, cigar, pipe, or chewing tobacco use?  Yes  If yes, current for former? Former If yes, type (Cigarette, cigar, pipe, and/or chewing tobacco)? Cigarettes   If former, year stopped? 1995 Number of years? 30 Packs/number/containers per day? 0.25   Blood Collection: Research blood obtained by fresh venipuncture. Patient tolerated well without any adverse events.  Gift Card: $50 gift card given to patient for her participation in this study.    Patient was thanked for their participation in this study.     Cherylyn Hoard, BSN, RN, [employer] Research Nurse II [phone] 08/10/2024

## 2024-08-10 NOTE — Telephone Encounter (Signed)
-----   Message from Wilbert Bihari, MD sent at 07/30/2024  8:40 PM EST ----- Echo showed normal heart function with increased stiffness of heart muscle called diastolic dysfunction.  Trivial leakiness of MV. Compared to 2024, leakiness of mitral valve has decreased.

## 2024-08-11 NOTE — Telephone Encounter (Signed)
 Patient returned RN's call regarding results.

## 2024-08-11 NOTE — Telephone Encounter (Signed)
-----   Message from Wilbert Bihari, MD sent at 07/30/2024  8:40 PM EST ----- Echo showed normal heart function with increased stiffness of heart muscle called diastolic dysfunction.  Trivial leakiness of MV. Compared to 2024, leakiness of mitral valve has decreased.

## 2024-08-11 NOTE — Telephone Encounter (Signed)
 Patient calling in for echo results. Patient verbalizes understanding that Echo showed normal heart function with increased stiffness of heart muscle called diastolic dysfunction.  Trivial leakiness of MV. Compared to 2024, leakiness of mitral valve has decreased which patient is appreciative of.

## 2024-08-21 ENCOUNTER — Ambulatory Visit: Payer: Self-pay | Admitting: Nurse Practitioner

## 2024-08-25 ENCOUNTER — Other Ambulatory Visit (HOSPITAL_COMMUNITY): Payer: Self-pay

## 2024-08-25 ENCOUNTER — Encounter (HOSPITAL_COMMUNITY): Payer: Self-pay

## 2024-08-25 MED ORDER — LIRAGLUTIDE -WEIGHT MANAGEMENT 18 MG/3ML ~~LOC~~ SOPN
3.0000 mg | PEN_INJECTOR | Freq: Every day | SUBCUTANEOUS | 3 refills | Status: AC
  Filled 2024-08-25: qty 15, 30d supply, fill #0

## 2024-08-31 ENCOUNTER — Other Ambulatory Visit (HOSPITAL_COMMUNITY): Payer: Self-pay

## 2024-08-31 MED ORDER — HYDROCODONE-ACETAMINOPHEN 5-325 MG PO TABS
1.0000 | ORAL_TABLET | Freq: Two times a day (BID) | ORAL | 0 refills | Status: AC | PRN
  Filled 2024-08-31 – 2024-09-04 (×2): qty 20, 10d supply, fill #0

## 2024-09-01 ENCOUNTER — Other Ambulatory Visit (HOSPITAL_COMMUNITY): Payer: Self-pay

## 2024-09-01 ENCOUNTER — Other Ambulatory Visit: Payer: Self-pay

## 2024-09-01 ENCOUNTER — Other Ambulatory Visit: Payer: Self-pay | Admitting: Nurse Practitioner

## 2024-09-01 DIAGNOSIS — J4520 Mild intermittent asthma, uncomplicated: Secondary | ICD-10-CM

## 2024-09-01 MED ORDER — ALBUTEROL SULFATE HFA 108 (90 BASE) MCG/ACT IN AERS
1.0000 | INHALATION_SPRAY | Freq: Four times a day (QID) | RESPIRATORY_TRACT | 5 refills | Status: AC | PRN
  Filled 2024-09-01 – 2024-09-04 (×2): qty 18, 50d supply, fill #0

## 2024-09-02 ENCOUNTER — Other Ambulatory Visit (HOSPITAL_COMMUNITY): Payer: Self-pay

## 2024-09-03 ENCOUNTER — Other Ambulatory Visit (HOSPITAL_COMMUNITY): Payer: Self-pay

## 2024-09-03 MED ORDER — LIRAGLUTIDE -WEIGHT MANAGEMENT 18 MG/3ML ~~LOC~~ SOPN
3.0000 mg | PEN_INJECTOR | Freq: Every day | SUBCUTANEOUS | 0 refills | Status: AC
  Filled 2024-09-03: qty 15, 30d supply, fill #0

## 2024-09-04 ENCOUNTER — Other Ambulatory Visit (HOSPITAL_COMMUNITY): Payer: Self-pay

## 2024-09-04 ENCOUNTER — Other Ambulatory Visit: Payer: Self-pay

## 2024-09-07 ENCOUNTER — Other Ambulatory Visit: Payer: Self-pay

## 2024-09-08 ENCOUNTER — Ambulatory Visit (INDEPENDENT_AMBULATORY_CARE_PROVIDER_SITE_OTHER): Admitting: Psychiatry

## 2024-09-08 ENCOUNTER — Other Ambulatory Visit: Payer: Self-pay

## 2024-09-08 VITALS — BP 150/76

## 2024-09-08 DIAGNOSIS — F330 Major depressive disorder, recurrent, mild: Secondary | ICD-10-CM

## 2024-09-08 DIAGNOSIS — L299 Pruritus, unspecified: Secondary | ICD-10-CM | POA: Diagnosis not present

## 2024-09-08 MED ORDER — BUPROPION HCL ER (XL) 150 MG PO TB24
150.0000 mg | ORAL_TABLET | Freq: Every day | ORAL | 0 refills | Status: AC
  Filled 2024-09-08: qty 91, 91d supply, fill #0

## 2024-09-08 MED ORDER — HYDROXYZINE HCL 25 MG PO TABS
25.0000 mg | ORAL_TABLET | Freq: Three times a day (TID) | ORAL | 2 refills | Status: AC | PRN
  Filled 2024-09-08: qty 90, 30d supply, fill #0

## 2024-09-08 NOTE — Progress Notes (Signed)
 BH MD Outpatient Progress Note  09/08/2024 9:17 AM Caroline Ryan [facility]  MRN: [id number]  Assessment:  Caroline Ryan [facility] presents for follow-up evaluation in-person. In the prior visit, patient's psychotropic medication regimen was maintained.   Today, patient presents with stable symptoms of depression in the context of ongoing psychosocial stressors of her medical condition and having to care for some family members. Reassuringly, she is using her coping skills to aid with her stressors along with finding benefit with her Wellbutrin . She was previously prescribed PRN hydroxyzine  and she states that historically, she would take the medication to aid with itching. I discussed that we can also prescribe the medication more so for anxiety and she consents to this. We will continue her current psychotropic medication and will provide her support therapy. In future appointments, will reassess depression and anxiety symptomatology.   Plan:  # Major depressive disorder, recurrent, mild # GAD # Insomnia- OSA on CPAP Past medication trials: escitalopram  - unfavorable weight gain Interventions: -- Continue Wellbutrin  ER 150 mg daily  -- Continue Atarax  25 mg TID PRN -- Vitamin B 12, folate, and vitamin D  level WNL on 12/2023 -- Therapy with this provider   Patient was reminded of contact information for behavioral health clinic and was instructed to call [phone] or [phone] for emergencies.   Identifying Information: Caroline Ryan is a 59 y.o. y.o. female with a past psychiatric history of MDD and GAD who is an established patient with [facility] for medication management.   Subjective:  CC: medication management  Interval History:   Patient reports feeling okay today. Regarding psychiatric symptoms, she feels her mood is good, stating her medications help her feel mellowed. She states when she feels emotion, she gets through it by listening to music and reading her  Bible. 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 Patient reports varied sleep, stating she is having more hot flashes that sometimes keep her up. Patient reports fair appetite. She states trying to eat boiled egg for breakfast and dinner.   Stressors include her medical condition with her knee pain, caring for her family, and worrying about her son who is also struggling with his mental health. She states her support system include her friend in [place] .   Patient denies current SI, HI, and AVH.   Substance use: Denies  Past Psychiatric History: Previous Psych Diagnoses: Recently diagnosed with MDD by therapist at [facility] Prior inpatient treatment: Denies Psychotherapy hx: Getting therapy with [name] at [facility] Previous suicidal attempts: Denies Previous medication trials: None History of detox: N/A History of rehab: N/A  Substance Abuse History: Formerly smoked, quit 2016. Former cannabis use, quit in 2019.   Social history: Lives with son sometimes and he helps with transportation. Her daughter lives around here too. Patient lives in an apartment. Job: unemployed Married/Children: single, two sons and one daughter Support: Son   Past Medical History:  Past Medical History:  Diagnosis Date   Anemia    Asthma    Depression    no meds   Depression    Eczema    Fibroid    Headache(784.0)    otc prn med   History of blood transfusion    Hypertension    Knee pain, bilateral    arthralgia knee pain bilateral   Lower extremity edema    Mitral regurgitation    trivial by echo 07/2024   Seasonal allergies    Sleep apnea  SVD (spontaneous vaginal delivery)    x 3   Vaginal polyp 05/2020    Past Surgical History:  Procedure Laterality Date   ABDOMINAL HYSTERECTOMY     BILATERAL SALPINGECTOMY Bilateral 10/10/2017   Procedure: BILATERAL SALPINGECTOMY;  Surgeon: Lorence Ozell CROME, MD;  Location: [facility];  Service: Gynecology;  Laterality:  Bilateral;   BREAST BIOPSY     us  guided core 2009   BREAST EXCISIONAL BIOPSY     right 1982 left 1981   BREAST SURGERY     bilateral cysts/benign   COLONOSCOPY  10/17/2021   VAGINAL HYSTERECTOMY N/A 10/10/2017   Procedure: HYSTERECTOMY VAGINAL;  Surgeon: Lorence Ozell CROME, MD;  Location: [facility];  Service: Gynecology;  Laterality: N/A;   Family Psychiatric History: Psych: Son- Schizophrenia, he comes to [facility]   Family History:  Family History  Problem Relation Age of Onset   Breast cancer Mother 46   Diabetes Father    Hypertension Father    Diabetes Brother    Diabetes Brother    Other Neg Hx    Colon polyps Neg Hx    Colon cancer Neg Hx    Esophageal cancer Neg Hx    Stomach cancer Neg Hx    Rectal cancer Neg Hx     Social History: Social History   Socioeconomic History   Marital status: Single    Spouse name: Not on file   Number of children: 3   Years of education: Not on file   Highest education level: Not on file  Occupational History   Not on file  Tobacco Use   Smoking status: Former    Current packs/day: 0.00    Average packs/day: 0.3 packs/day for 2.0 years (0.5 ttl pk-yrs)    Types: Cigarettes    Start date: 08/20/2012    Quit date: 08/20/2014    Years since quitting: 10.0    Passive exposure: Never   Smokeless tobacco: Never  Vaping Use   Vaping status: Never Used  Substance and Sexual Activity   Alcohol use: Yes    Alcohol/week: 2.0 standard drinks of alcohol    Types: 2 Glasses of wine per week    Comment: once a week   Drug use: No   Sexual activity: Yes    Birth control/protection: Condom  Other Topics Concern   Not on file  Social History Narrative   Not on file   Social Drivers of Health   Tobacco Use: Medium Risk (05/01/2024)   Patient History    Smoking Tobacco Use: Former    Smokeless Tobacco Use: Never    Passive Exposure: Never  Physicist, Medical Strain: High Risk (08/21/2022)   Overall Financial Resource Strain (CARDIA)     Difficulty of Paying Living Expenses: Very hard  Food Insecurity: Food Insecurity Present (08/21/2022)   Hunger Vital Sign    Worried About Running Out of Food in the Last Year: Often true    Ran Out of Food in the Last Year: Often true  Transportation Needs: No Transportation Needs (08/21/2022)   PRAPARE - Administrator, Civil Service (Medical): No    Lack of Transportation (Non-Medical): No  Physical Activity: Inactive (08/21/2022)   Exercise Vital Sign    Days of Exercise per Week: 0 days    Minutes of Exercise per Session: 0 min  Stress: Stress Concern Present (07/27/2022)   [employer] of Occupational Health - Occupational Stress Questionnaire    Feeling of Stress : Very much  Social Connections: Socially Isolated (08/21/2022)   Social Connection and Isolation Panel    Frequency of Communication with Friends and Family: More than three times a week    Frequency of Social Gatherings with Friends and Family: Never    Attends Religious Services: Never    Database Administrator or Organizations: No    Attends Banker Meetings: Never    Marital Status: Never married  Depression (PHQ2-9): Low Risk (12/20/2023)   Depression (PHQ2-9)    PHQ-2 Score: 1  Recent Concern: Depression (PHQ2-9) - High Risk (11/13/2023)   Depression (PHQ2-9)    PHQ-2 Score: 11  Alcohol Screen: Low Risk (08/21/2022)   Alcohol Screen    Last Alcohol Screening Score (AUDIT): 0  Housing: Medium Risk (03/09/2022)   Housing    Last Housing Risk Score: 1  Utilities: At Risk (08/21/2022)   AHC Utilities    Threatened with loss of utilities: Yes  Health Literacy: Not on file    Allergies:  No Known Allergies  Current Medications: Current Outpatient Medications  Medication Sig Dispense Refill   acetaminophen  (TYLENOL ) 325 MG tablet Take 2 tablets (650 mg total) by mouth daily as needed for moderate pain (pain score 4-6), headache or fever. 100 tablet 0   albuterol  (VENTOLIN  HFA) 108 (90 Base)  MCG/ACT inhaler Inhale 1 puff every 6 hours as needed for shortness of breath 18 g 5   aspirin  (ASPIRIN  LOW DOSE) 81 MG chewable tablet Chew 1 tablet (81 mg total) by mouth daily. 30 tablet 2   budesonide -formoterol  (SYMBICORT ) 160-4.5 MCG/ACT inhaler Inhale 1 puff into the lungs 2 (two) times daily. 30.6 g 3   buPROPion  (WELLBUTRIN  XL) 150 MG 24 hr tablet Take 1 tablet (150 mg total) by mouth daily. 30 tablet 2   carvedilol  (COREG ) 25 MG tablet Take 1 tablet (25 mg total) by mouth 2 (two) times daily. 180 tablet 3   carvedilol  (COREG ) 25 MG tablet Take 1 tablet (25 mg total) by mouth 2 (two) times daily with a meal. 180 tablet 3   cetirizine  (ZYRTEC ) 10 MG tablet Take 1 tablet (10 mg total) by mouth daily. 30 tablet 6   ciclopirox  (LOPROX ) 0.77 % cream Apply topically 2 (two) times daily. 15 g 0   cyclobenzaprine  (FLEXERIL ) 5 MG tablet Take 1 tablet (5 mg total) by mouth 3 (three) times daily as needed for muscle spasms. 30 tablet 0   cyclobenzaprine  (FLEXERIL ) 5 MG tablet Take 1 tablet (5 mg total) by mouth 3 (three) times daily. 90 tablet 3   diclofenac  Sodium (VOLTAREN ) 1 % GEL Apply 4 g topically 4 (four) times daily. 100 g 3   diclofenac  Sodium (VOLTAREN ) 1 % GEL Apply 2 g topically to affected areas 4 (four) times daily. 100 g 3   docusate sodium  (COLACE) 100 MG capsule Take 1 capsule (100 mg total) by mouth 2 (two) times daily as needed. 30 capsule 2   ferrous gluconate  (FERGON) 324 MG tablet Take 1 tablet (324 mg total) by mouth 2 (two) times daily with a meal. 180 tablet 3   fluticasone  (FLONASE ) 50 MCG/ACT nasal spray Place 1 spray into both nostrils daily. (Patient taking differently: Place 1 spray into both nostrils daily as needed for allergies.) 16 g 2   furosemide  (LASIX ) 20 MG tablet Take 1 tablet (20 mg total) by mouth daily. (Patient taking differently: Take 20 mg by mouth as needed.) 30 tablet 6   HYDROcodone -acetaminophen  (NORCO/VICODIN) 5-325 MG tablet Take 1 tablet  by mouth 2  (two) times daily as needed  for pain 20 tablet 0   hydrocortisone  2.5 % cream Apply topically 2 (two) times daily. 30 g 1   hydrOXYzine  (ATARAX ) 25 MG tablet Take 1 tablet (25 mg total) by mouth 3 (three) times daily as needed. 30 tablet 11   hydrOXYzine  (ATARAX ) 25 MG tablet Take 1 tablet (25 mg total) by mouth 3 (three) times daily. 270 tablet 3   ipratropium-albuterol  (DUONEB) 0.5-2.5 (3) MG/3ML SOLN Take 3 mLs by nebulization every 6 (six) hours as needed. 360 mL 0   linaclotide  (LINZESS ) 72 MCG capsule Take 1 capsule (72 mcg total) by mouth daily before breakfast. 30 capsule 0   Liraglutide  -Weight Management 18 MG/3ML SOPN Inject 3 mg subcutaneously every day in the abdomen, thigh, or upper arm. 15 mL 6   Liraglutide  -Weight Management 18 MG/3ML SOPN Inject 3 mg into the skin daily in the abdomen, thigh or upper arm 15 mL 6   Liraglutide  -Weight Management 18 MG/3ML SOPN Inject 3 mg into the skin daily in the abdomen, thigh, or upper arm. 15 mL 3   Liraglutide  -Weight Management 18 MG/3ML SOPN Inject 3 mg into the skin daily (on thigh, abdomen or upper arm). 15 mL 0   meloxicam  (MOBIC ) 15 MG tablet Take 1 tablet (15 mg total) by mouth daily as needed for pain. 90 tablet 3   meloxicam  (MOBIC ) 15 MG tablet Take 1 tablet (15 mg total) by mouth daily. 90 tablet 3   methocarbamol  (ROBAXIN ) 750 MG tablet Take 1 tablet (750 mg total) by mouth 4 (four) times daily. 120 tablet 3   montelukast  (SINGULAIR ) 10 MG tablet Take 1 tablet (10 mg total) by mouth at bedtime. 30 tablet 3   montelukast  (SINGULAIR ) 10 MG tablet Take 1 tablet (10 mg total) by mouth every evening. 90 tablet 3   nitroGLYCERIN  (NITROSTAT ) 0.4 MG SL tablet Place 1 tablet (0.4 mg total) under the tongue every 5 (five) minutes as needed for chest pain (max 3 doses, if no relief call 911). 25 tablet 5   NONFORMULARY OR COMPOUNDED ITEM Diclofenac  3%, Gabapentin  5%, Lidocaine  5%, Menthol 1%.  Apply 1-2 pumps three to four times per day as  needed (Patient not taking: Reported on 05/01/2024) 1 each 0   olmesartan  (BENICAR ) 40 MG tablet Take 1 tablet (40 mg total) by mouth daily. 90 tablet 3   olmesartan  (BENICAR ) 40 MG tablet Take 1 tablet (40 mg total) by mouth daily. 90 tablet 3   Respiratory Therapy Supplies (NEBULIZER MASK ADULT) MISC Use this mask with your nebulizer. 1 each 0   rosuvastatin  (CRESTOR ) 10 MG tablet Take one tablet by mouth at bedtime 90 tablet 3   rosuvastatin  (CRESTOR ) 10 MG tablet Take 1 tablet (10 mg total) by mouth at bedtime. 90 tablet 3   spironolactone  (ALDACTONE ) 100 MG tablet Take 1 tablet (100 mg total) by mouth daily. 90 tablet 3   spironolactone  (ALDACTONE ) 100 MG tablet Take 1 tablet by mouth daily. 90 tablet 3   triamcinolone  cream (KENALOG ) 0.5 % Apply 1 Application topically 3 (three) times daily. 30 g 0   Vitamin D , Ergocalciferol , (DRISDOL ) 1.25 MG (50000 UNIT) CAPS capsule Take 1 capsule (50,000 Units total) by mouth every 7 (seven) days. 5 capsule 0   Current Facility-Administered Medications  Medication Dose Route Frequency Provider Last Rate Last Admin   Triamcinolone  Acetonide (ZILRETTA ) intra-articular injection 32 mg  32 mg Intra-articular Once Raulkar, Krutika P, MD  Triamcinolone  Acetonide (ZILRETTA ) intra-articular injection 32 mg  32 mg Intra-articular Once Raulkar, Sven SQUIBB, MD         Objective:  Psychiatric Specialty Exam: General Appearance: appears at stated age, casually dressed and groomed   Behavior: pleasant and cooperative   Psychomotor Activity: no psychomotor agitation or retardation noted   Eye Contact: fair  Speech: normal amount, volume and fluency    Mood: okay  Affect: congruent  Thought Process: linear, goal directed, no circumstantial or tangential thought process noted, no racing thoughts or flight of ideas  Descriptions of Associations: intact   Thought Content Hallucinations: denies AH, VH , does not appear responding to stimuli   Delusions: no paranoia, delusions of control, grandeur, ideas of reference, thought broadcasting, and magical thinking  Suicidal Thoughts: denies SI, intention, plan  Homicidal Thoughts: denies HI, intention, plan   Alertness/Orientation: alert and fully oriented   Insight: fair Judgment: fair  Memory: intact   Executive Functions  Concentration: intact  Attention Span: fair  Recall: intact  Fund of Knowledge: fair   Physical Exam  General: Pleasant, well-appearing, using a cane Pulmonary: Normal effort. No wheezing or rales. Skin: No obvious rash or lesions. Neuro: A&Ox3.No focal deficit.  Review of Systems  No reported symptoms   Metabolic Disorder Labs: Lab Results  Component Value Date   HGBA1C 4.9 02/27/2023   MPG 82 07/04/2016   MPG 100 06/27/2015   No results found for: PROLACTIN Lab Results  Component Value Date   CHOL 201 (H) 03/06/2024   TRIG 83 03/06/2024   HDL 63 03/06/2024   CHOLHDL 3.2 03/06/2024   VLDL 9 06/27/2015   LDLCALC 123 (H) 03/06/2024   LDLCALC 116 (H) 02/27/2023   Lab Results  Component Value Date   TSH 2.710 02/27/2023   TSH 1.530 12/24/2018    Therapeutic Level Labs: No results found for: CBMZ No results found for: LITHIUM No results found for: VALPROATE  Screenings:  GAD-7    Flowsheet Row Office Visit from 09/21/2022 in [facility] Counselor from 08/21/2022 in [facility] Office Visit from [facility]09/[facility] Office Visit from 07/19/2017 in [facility]  Total GAD-7 Score 17 21 9 14    PHQ2-9    Flowsheet Row Office Visit from 12/20/2023 in [facility] Office Visit from 11/13/2023 in [facility] Office Visit from 04/26/2023 in [facility] Clinical Support  from 03/27/2023 in [facility] Office Visit from 09/21/2022 in [facility]  PHQ-2 Total Score 1 3 3 4 4   PHQ-9 Total Score -- 11 -- 14 16   Flowsheet Row CT Bone Marrow Biopsy from 06/06/2023 in [facility] from 10/23/2022 in [facility] at [facility] Counselor from 08/21/2022 in [facility]  C-SSRS RISK CATEGORY No Risk No Risk Low Risk   Ismael Franco, MD PGY-3 Psychiatry Resident

## 2024-09-10 ENCOUNTER — Other Ambulatory Visit: Payer: Self-pay

## 2024-09-10 ENCOUNTER — Other Ambulatory Visit (HOSPITAL_BASED_OUTPATIENT_CLINIC_OR_DEPARTMENT_OTHER): Payer: Self-pay

## 2024-09-16 ENCOUNTER — Ambulatory Visit: Payer: Self-pay | Admitting: Nurse Practitioner

## 2024-09-17 ENCOUNTER — Other Ambulatory Visit: Payer: Self-pay

## 2024-09-17 ENCOUNTER — Encounter: Payer: Self-pay | Admitting: Nurse Practitioner

## 2024-09-22 ENCOUNTER — Ambulatory Visit (HOSPITAL_COMMUNITY): Admitting: Psychiatry

## 2024-10-05 ENCOUNTER — Inpatient Hospital Stay: Payer: 59 | Admitting: Hematology

## 2024-10-05 ENCOUNTER — Inpatient Hospital Stay: Payer: 59 | Attending: Hematology

## 2024-10-06 ENCOUNTER — Ambulatory Visit (HOSPITAL_COMMUNITY): Admitting: Psychiatry

## 2024-10-20 ENCOUNTER — Ambulatory Visit (HOSPITAL_COMMUNITY): Admitting: Psychiatry

## 2024-12-08 ENCOUNTER — Encounter (HOSPITAL_COMMUNITY): Admitting: Psychiatry

## 2024-12-15 ENCOUNTER — Encounter (HOSPITAL_COMMUNITY): Admitting: Psychiatry
